# Patient Record
Sex: Female | Born: 1965 | Race: White | Hispanic: No | Marital: Single | Smoking: Never smoker
Health system: Southern US, Community
[De-identification: ages and names within clinical notes are randomized; demographics above are authoritative.]

## PROBLEM LIST (undated history)

## (undated) DIAGNOSIS — F410 Panic disorder [episodic paroxysmal anxiety] without agoraphobia: Secondary | ICD-10-CM

## (undated) DIAGNOSIS — K219 Gastro-esophageal reflux disease without esophagitis: Secondary | ICD-10-CM

## (undated) DIAGNOSIS — K859 Acute pancreatitis without necrosis or infection, unspecified: Secondary | ICD-10-CM

## (undated) DIAGNOSIS — G4730 Sleep apnea, unspecified: Secondary | ICD-10-CM

## (undated) DIAGNOSIS — M1990 Unspecified osteoarthritis, unspecified site: Secondary | ICD-10-CM

## (undated) DIAGNOSIS — D696 Thrombocytopenia, unspecified: Secondary | ICD-10-CM

## (undated) DIAGNOSIS — E119 Type 2 diabetes mellitus without complications: Secondary | ICD-10-CM

## (undated) HISTORY — PX: OTHER SURGICAL HISTORY: SHX169

## (undated) HISTORY — PX: TOTAL THYROIDECTOMY: SHX2547

## (undated) HISTORY — PX: CHOLECYSTECTOMY: SHX55

## (undated) HISTORY — PX: SHOULDER SURGERY: SHX246

---

## 2000-03-19 ENCOUNTER — Encounter: Admission: RE | Admit: 2000-03-19 | Discharge: 2000-03-19 | Payer: Self-pay | Admitting: Emergency Medicine

## 2000-05-30 ENCOUNTER — Encounter: Admission: RE | Admit: 2000-05-30 | Discharge: 2000-05-30 | Payer: Self-pay | Admitting: Internal Medicine

## 2000-07-09 ENCOUNTER — Emergency Department (HOSPITAL_COMMUNITY): Admission: EM | Admit: 2000-07-09 | Discharge: 2000-07-09 | Payer: Self-pay | Admitting: Emergency Medicine

## 2000-07-19 ENCOUNTER — Emergency Department (HOSPITAL_COMMUNITY): Admission: EM | Admit: 2000-07-19 | Discharge: 2000-07-19 | Payer: Self-pay | Admitting: Emergency Medicine

## 2001-05-21 ENCOUNTER — Other Ambulatory Visit: Admission: RE | Admit: 2001-05-21 | Discharge: 2001-05-21 | Payer: Self-pay | Admitting: Obstetrics and Gynecology

## 2001-05-23 ENCOUNTER — Ambulatory Visit (HOSPITAL_COMMUNITY): Admission: RE | Admit: 2001-05-23 | Discharge: 2001-05-23 | Payer: Self-pay | Admitting: Internal Medicine

## 2001-05-23 ENCOUNTER — Encounter: Payer: Self-pay | Admitting: Internal Medicine

## 2001-06-21 ENCOUNTER — Emergency Department (HOSPITAL_COMMUNITY): Admission: EM | Admit: 2001-06-21 | Discharge: 2001-06-22 | Payer: Self-pay | Admitting: *Deleted

## 2001-06-25 ENCOUNTER — Ambulatory Visit (HOSPITAL_COMMUNITY): Admission: RE | Admit: 2001-06-25 | Discharge: 2001-06-25 | Payer: Self-pay | Admitting: Internal Medicine

## 2001-06-25 ENCOUNTER — Encounter: Payer: Self-pay | Admitting: Internal Medicine

## 2001-07-05 ENCOUNTER — Ambulatory Visit (HOSPITAL_COMMUNITY): Admission: RE | Admit: 2001-07-05 | Discharge: 2001-07-05 | Payer: Self-pay | Admitting: Internal Medicine

## 2001-07-05 ENCOUNTER — Encounter: Payer: Self-pay | Admitting: Internal Medicine

## 2002-01-23 ENCOUNTER — Emergency Department (HOSPITAL_COMMUNITY): Admission: EM | Admit: 2002-01-23 | Discharge: 2002-01-23 | Payer: Self-pay | Admitting: Emergency Medicine

## 2002-01-23 ENCOUNTER — Encounter: Payer: Self-pay | Admitting: Emergency Medicine

## 2002-08-09 ENCOUNTER — Encounter: Payer: Self-pay | Admitting: Emergency Medicine

## 2002-08-09 ENCOUNTER — Emergency Department (HOSPITAL_COMMUNITY): Admission: EM | Admit: 2002-08-09 | Discharge: 2002-08-09 | Payer: Self-pay | Admitting: Emergency Medicine

## 2002-08-14 ENCOUNTER — Encounter: Payer: Self-pay | Admitting: Emergency Medicine

## 2002-08-14 ENCOUNTER — Ambulatory Visit (HOSPITAL_COMMUNITY): Admission: RE | Admit: 2002-08-14 | Discharge: 2002-08-14 | Payer: Self-pay | Admitting: Emergency Medicine

## 2002-09-30 ENCOUNTER — Emergency Department (HOSPITAL_COMMUNITY): Admission: EM | Admit: 2002-09-30 | Discharge: 2002-10-01 | Payer: Self-pay | Admitting: *Deleted

## 2002-09-30 ENCOUNTER — Encounter: Payer: Self-pay | Admitting: *Deleted

## 2002-12-08 ENCOUNTER — Inpatient Hospital Stay (HOSPITAL_COMMUNITY): Admission: EM | Admit: 2002-12-08 | Discharge: 2002-12-17 | Payer: Self-pay | Admitting: Psychiatry

## 2002-12-26 ENCOUNTER — Emergency Department (HOSPITAL_COMMUNITY): Admission: EM | Admit: 2002-12-26 | Discharge: 2002-12-26 | Payer: Self-pay | Admitting: Emergency Medicine

## 2003-01-22 ENCOUNTER — Inpatient Hospital Stay (HOSPITAL_COMMUNITY): Admission: EM | Admit: 2003-01-22 | Discharge: 2003-01-27 | Payer: Self-pay | Admitting: Psychiatry

## 2003-02-05 ENCOUNTER — Emergency Department (HOSPITAL_COMMUNITY): Admission: EM | Admit: 2003-02-05 | Discharge: 2003-02-05 | Payer: Self-pay | Admitting: Emergency Medicine

## 2003-03-11 ENCOUNTER — Ambulatory Visit (HOSPITAL_COMMUNITY): Admission: RE | Admit: 2003-03-11 | Discharge: 2003-03-11 | Payer: Self-pay | Admitting: Family Medicine

## 2003-03-18 ENCOUNTER — Emergency Department (HOSPITAL_COMMUNITY): Admission: EM | Admit: 2003-03-18 | Discharge: 2003-03-18 | Payer: Self-pay | Admitting: Emergency Medicine

## 2003-03-20 ENCOUNTER — Ambulatory Visit (HOSPITAL_COMMUNITY): Admission: RE | Admit: 2003-03-20 | Discharge: 2003-03-20 | Payer: Self-pay | Admitting: Orthopedic Surgery

## 2003-04-01 ENCOUNTER — Encounter (HOSPITAL_COMMUNITY): Admission: RE | Admit: 2003-04-01 | Discharge: 2003-05-02 | Payer: Self-pay | Admitting: Internal Medicine

## 2003-04-04 ENCOUNTER — Emergency Department (HOSPITAL_COMMUNITY): Admission: EM | Admit: 2003-04-04 | Discharge: 2003-04-04 | Payer: Self-pay | Admitting: *Deleted

## 2003-04-21 ENCOUNTER — Inpatient Hospital Stay (HOSPITAL_COMMUNITY): Admission: EM | Admit: 2003-04-21 | Discharge: 2003-04-22 | Payer: Self-pay | Admitting: Emergency Medicine

## 2003-05-07 ENCOUNTER — Encounter (HOSPITAL_COMMUNITY): Admission: RE | Admit: 2003-05-07 | Discharge: 2003-06-06 | Payer: Self-pay | Admitting: Orthopedic Surgery

## 2003-07-03 ENCOUNTER — Ambulatory Visit (HOSPITAL_COMMUNITY): Admission: RE | Admit: 2003-07-03 | Discharge: 2003-07-03 | Payer: Self-pay | Admitting: General Surgery

## 2003-07-13 ENCOUNTER — Inpatient Hospital Stay (HOSPITAL_COMMUNITY): Admission: EM | Admit: 2003-07-13 | Discharge: 2003-07-23 | Payer: Self-pay | Admitting: Psychiatry

## 2003-07-13 ENCOUNTER — Emergency Department (HOSPITAL_COMMUNITY): Admission: EM | Admit: 2003-07-13 | Discharge: 2003-07-13 | Payer: Self-pay | Admitting: Emergency Medicine

## 2004-11-06 ENCOUNTER — Inpatient Hospital Stay (HOSPITAL_COMMUNITY): Admission: EM | Admit: 2004-11-06 | Discharge: 2004-11-07 | Payer: Self-pay | Admitting: Emergency Medicine

## 2004-11-06 ENCOUNTER — Ambulatory Visit: Payer: Self-pay | Admitting: Psychiatry

## 2006-01-11 ENCOUNTER — Ambulatory Visit: Payer: Self-pay | Admitting: Psychiatry

## 2006-01-11 ENCOUNTER — Inpatient Hospital Stay (HOSPITAL_COMMUNITY): Admission: RE | Admit: 2006-01-11 | Discharge: 2006-01-16 | Payer: Self-pay | Admitting: Psychiatry

## 2007-04-13 ENCOUNTER — Emergency Department (HOSPITAL_COMMUNITY): Admission: EM | Admit: 2007-04-13 | Discharge: 2007-04-13 | Payer: Self-pay | Admitting: Emergency Medicine

## 2010-04-10 ENCOUNTER — Encounter: Payer: Self-pay | Admitting: Orthopedic Surgery

## 2010-04-11 ENCOUNTER — Encounter: Payer: Self-pay | Admitting: Family Medicine

## 2010-08-06 NOTE — Consult Note (Signed)
NAME:  [name], SCHUFF                       ACCOUNT NO.:  [id number]   MEDICAL RECORD NO.:  [id number]                   PATIENT TYPE:  INP   LOCATION:  A212                                 FACILITY:  [facility]   PHYSICIAN:  Scarlett Presto, M.D.                DATE OF BIRTH:  22-Oct-1965   DATE OF CONSULTATION:  04/21/2003  DATE OF DISCHARGE:                                   CONSULTATION   CARDIOLOGY CONSULTATION   PRIMARY CARE Cordarrel Stiefel:  Barrie Folk. Berdine Addison, M.D.   HISTORY OF PRESENT ILLNESS:  Ms. [name] is a 45 year old female with no  known coronary artery disease who presented to the [facility] ER  complaining of left anterior chest discomfort which was associated with  dyspnea and nausea.  She reports the gradual onset of this primarily at  rest. It is sharp in nature. It is constant.  It lasted for approximately 1-  1/2 hours before she was given a sublingual nitroglycerin with complete  relief. She basically was essentially in the shower; and the when she  finished the shower and she walked outside she began to feel a little bit  better.  The pain started in the shoulder.  She took a Benadryl, though it  would help her relax.  She felt like she was very very anxious when this  whole thing occurred.  She took 4 baby aspirin and came to the emergency  department.  She was given a sublingual nitroglycerin there. She got  immediate relief, and then had a recurrence of the pain, this morning, which  lasted minutes and resolved on its own.   PAST MEDICAL HISTORY:  Her past medical history is significant for  hyperlipidemia, severe anxiety and panic disorder with a history of suicidal  ideation. She has gastroesophageal reflux disease, bipolar disorder with  borderline personality.  She also has a history of lumbar spinal DJD for  which she is undergoing physical therapy for this. She had her tonsils out  and had laparoscopic treatment for endometriosis.   SOCIAL HISTORY:   She lives in [place] with her brother and his girlfriend  and their children.  She is disabled secondary to her severe psychiatric  disorder.  She does not smoke, she seldom drinks alcohol.  She does not do  any exercise due to her severe back pain although she gets physical therapy  3 times a week. Her mother died at age 37 of a myocardial infarction.  Father is alive at 64, but is not available, and his location is unknown.  She has 2 brothers and 2 half-brothers, none of whom have coronary disease.   MEDICATIONS PRIOR TO ADMISSION:  1. Demerol 100 mg q.4h. p.r.n. pain.  2. Seroquel 100 mg at night.  3. Remeron 30 mg at night.  4. Neurontin 300 mg 3 times a day  5. Trileptal 300 mg at night.   [place]:  1. Aspirin 325.  2. 1/2-normal saline with D5 at 100 an hour.  3. Neurontin 300 t.i.d.  4. Remeron 30 mg q.h.s.  5. Trileptal 300 mg q.h.s.  6. Protonix 80 mg daily.  7. Seroquel 100 mg q.h.s.  8. Demerol on a p.r.n. basis.  9. She takes nitroglycerin on a p.r.n. basis.   REVIEW OF SYSTEMS:  Her review of systems was essentially unremarkable  except for the description of the chest discomfort.  She does have frequent  nausea, anxiety, and depression. She gets tingling in her fingers whenever  she gets very nauseated.  She also gets periorbital tingling. She also  complains of a dry mouth.  All her other review of systems are negative.   PHYSICAL EXAMINATION:  GENERAL:  She is a well-developed, well-nourished,  white female in no apparent distress.  MENTAL STATUS:  She is alert and oriented x4.  VITAL SIGNS:  Pulse is 75, respiratory rate is 20.  Her blood pressure  is125/65.  She is just slightly overweight.  Lying in bed.  HEENT:  Exam is unremarkable except for very poor dentition.  NECK:  Her neck is supple.  She has no jugular venous distention or carotid  bruits.  CHEST:  Her chest is clear to auscultation.  CARDIOVASCULAR:  Exam is a regular rate  and rhythm with no murmurs, rubs, or  gallops.  ABDOMEN:  Her abdomen is soft, nontender with normoactive bowel sounds.  EXTREMITIES:  There are 2+ pulses throughout.  There is no clubbing,  cyanosis, or edema.  NEUROLOGIC:  Her neurologic exam is grossly nonfocal.  BREASTS, GENITOURINARY, AND RECTAL:  Exams were deferred.   CHEST X-RAY:  Normal.   ELECTROCARDIOGRAM:  Initially showed sinus tachycardia at a rate of 114 with  normal axes, normal intervals, but ST segment depression in leads V2-V4  which was horizontal and a little bit concerning.  Repeat electrocardiogram  shows sinus rhythm at a rate of 79 with nonspecific ST-T wave changes and  the ST depression is essentially resolved.   LABORATORIES:  White blood cell count 5.4; her H&H 12 and 36; platelet count  143.  Sodium 132, potassium 3.4, chloride 103, bicarb 27, BUN 3, creatinine  0.5, and her blood sugar is 127.  Two sets of cardiac enzymes are not  consistent with acute myocardial infarction.   ASSESSMENT:  So, we have a woman who has an interesting chest pain syndrome  which is very atypical coronary disease, but has EKG changes associated with  it.  Of note, is the EKG changes and the tachycardia are interesting in that  there is some repolarization abnormality in the other leads as well.  This  appears to resolve when she is in sinus.  She does have gastroesophageal  reflux disease.  She has the hyperlipidemia; we do not have any records of  that. She will probably need a set of fasting lipids and the degenerative  joint disease and then the anxiety panic disorders in the Axis I and II  psychiatric problems.   RECOMMENDATIONS:  We do an adenosine perfusion study.  I am not convinced  that these EKG changes are ischemic. She had relatively long duration of the  discomfort without any evidence of elevated cardiac enzymes.  Her electrocardiogram, currently, is pretty normal; and, I suspect, it is  possible that the  ST-T wave changes may be  repolarization abnormality associated with her neuroleptic medications.  At  this point, I think that it is  important to get a sense for the perfusion  through the left ventricle and to get a sense for whether or not she has  significant coronary disease.  We will also need to check her lipids.      ___________________________________________                                            Scarlett Presto, M.D.   JH/MEDQ  D:  04/21/2003  T:  04/21/2003  Job:  [id number]
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
## 2010-08-06 NOTE — Procedures (Signed)
NAME:  [name], [name]                       ACCOUNT NO.:  [id number]   MEDICAL RECORD NO.:  [id number]                   PATIENT TYPE:  INP   LOCATION:  A212                                 FACILITY:  [facility]   PHYSICIAN:  Scarlett Presto, M.D.                DATE OF BIRTH:  04-Sep-1965   DATE OF PROCEDURE:  DATE OF DISCHARGE:  04/22/2003                                    STRESS TEST   PROCEDURE:  Adenosine Cardizem stress test.   INDICATION:  [name] is a 45 year old female with no known coronary  artery disease with atypical chest discomfort.  She does have some ST  depression in leads V3 and V4 with increased heart rate.  This resolved with  a decrease in her heart rate.  She has had two sets of cardiac enzymes which  were negative  for acute myocardial infarction.   BASELINE DATA:  EKG reveals sinus rhythm at 96 beats per minute with  nonspecific ST abnormalities.  Blood pressure is 138/88.   DESCRIPTION OF PROCEDURE:  Adenosine, 39 mg, was infused over a four-minute  protocol with Cardiolite injected at three minutes.  The patient reported  chest heaviness, flushing and nausea, all of which resolved during recovery.  She did have ST depression in the inferolateral leads with increased heart  rate.  Maximum heart rate was 140 beats per minute.  Maximum blood pressure  was 148/78.   Final images and results are pending M.D. review.     ________________________________________  ___________________________________________  Cherre Blanc, P.A. [facility]                      Scarlett Presto, M.D.   AB/MEDQ  D:  04/22/2003  T:  04/22/2003  Job:  [id number]
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
## 2010-08-06 NOTE — H&P (Signed)
NAME:  [name], [name]                       ACCOUNT NO.:  [id number]   MEDICAL RECORD NO.:  [id number]                   PATIENT TYPE:  AMB   LOCATION:  DAY                                  FACILITY:  [facility]   PHYSICIAN:  Leane Para C. Tamala Julian, M.D.                DATE OF BIRTH:  [birth year]   DATE OF ADMISSION:  DATE OF DISCHARGE:                                HISTORY & PHYSICAL   HISTORY OF PRESENT ILLNESS:  [age]-year-old female with a history of  recurrent episodes of indigestion and nausea.  She has not had vomiting.  Her weight has been stable.  She has bilateral lower abdominal discomfort,  no diarrhea.  Nexium has been prescribed but has not been effective.  She is  scheduled for EGD.   PAST HISTORY:  She has:  1. Gastroesophageal reflux disease.  2. Panic disorder.  3. Chronic low back pain.   MEDICATIONS:  1. Aspirin 81 mg a day.  2. Lipitor 20 mg per day.  3. Nexium 40 mg b.i.d.  4. Singulair 10 mg daily.  5. Demerol 100 mg b.i.d.   SURGERY:  1. Tonsillectomy.  2. Laparoscopy.   PHYSICAL EXAMINATION:  VITAL SIGNS:  On exam, blood pressure 90/68, pulse  76, respirations 20, weight 149 pounds.  HEENT:  Unremarkable.  NECK:  Neck is supple.  No JVD or bruit.  CHEST:  Chest clear to auscultation.  HEART:  Regular rate and rhythm without murmur, gallop or rub.  ABDOMEN:  Mild epigastric tenderness, mild left lower quadrant tenderness.  BACK:  Diffuse lower lumbar tenderness.  EXTREMITIES:  No cyanosis, clubbing or edema.  NEUROLOGIC:  No focal motor, sensory or cerebellar deficit.   IMPRESSION:  1. Severe gastroesophageal reflux disease.  2. Bipolar disorder.  3. Chronic low back pain.  4. Seasonal allergies.   PLAN:  Upper and possible lower endoscopy.     ___________________________________________                                         Vernon Prey. Tamala Julian, M.D.   LCS/MEDQ  D:  07/02/2003  T:  07/03/2003  Job:  [id number]

## 2010-08-06 NOTE — H&P (Signed)
NAME:  [name], SAWDEY                       ACCOUNT NO.:  [id number]   MEDICAL RECORD NO.:  [id number]                   PATIENT TYPE:  INP   LOCATION:  A212                                 FACILITY:  [facility]   PHYSICIAN:  Unk Lightning, MD        DATE OF BIRTH:  [birth year]   DATE OF ADMISSION:  04/20/2003  DATE OF DISCHARGE:                                HISTORY & PHYSICAL   HISTORY OF PRESENT ILLNESS:  The patient is a 45 year old white female  status post menopause for 2 years with a history of mild hyperlipidemia who  was at rest tonight and had left-sided sharp chest pain associated with  nausea which persisted for 90 minutes.  This was associated also with  dyspnea.  She has a history of general anxiety disorder and panic disorder,  and specifically states this different than her previous episodes.  She was  found to have a mildly elevated troponin with normal CK's.  She was admitted  for observation and repeat EKG and enzymes with minimal risk factors.   PAST MEDICAL HISTORY:  1. Significant for anxiety disorder and panic disorder.  2. Mild hyperlipidemia.  3. GERD.  4. Bipolar disorder.  5. DJD of the lumbosacral spine, undergoing physical therapy.   PAST SURGICAL HISTORY:  1. Remarkable for tonsillectomy.  2. Laparoscopic surgery for endometriosis.   LAST MENSTRUAL PERIOD:  Two years ago.   CURRENT MEDICATIONS:  1. Demerol 100 mg q.4h. p.r.n.  2. Seroquel 100 mg h.s.  3. Remeron 30 mg h.s.  4. Neurontin 300 mg t.i.d.  5. Trileptal 300 mg h.s.   SOCIAL HISTORY:  She is a nonsmoker.  She is separated.   PHYSICAL EXAMINATION:  VITAL SIGNS:  Blood pressure 120/78, pulse 90 and  regular, she is afebrile, respiratory rate 18.  HEENT:  Head is normocephalic and atraumatic.  Eyes - pupils are equal,  round and reactive to light.  Extraocular movements intact.  Sclerae are  clear.  Conjunctivae pink.  NECK:  No jugular venous distention, 45 degree  angulation.  No carotid  bruits, no thyromegaly.  LUNGS:  Clear to auscultation and percussion.  No rales, wheezes, or rhonchi  appreciable.  HEART:  Regular rhythm.  No murmurs, gallops, heaves, thrills, or rubs.  ABDOMEN:  Obese, soft, nontender.  Bowel sounds normoactive.  No rebound, no  mass, no megaly.  EXTREMITIES:  No clubbing, cyanosis, or edema.  NEUROLOGIC:  Cranial nerves II-XII are grossly intact.  The patient moves  all 4 extremities.  Does not really appear anxious at present.   IMPRESSION:  1. Typical/atypical chest pain.  2. Elevated troponins which are inexplicable.  3. General anxiety disorder/panic disorder.  4. Mild hyperlipidemia.  5. Status post menopause 2 years.   PLAN:  Admit, repeat EKG and enzymes in the a.m., and assess any recurrence  of symptoms, and make further recommendations as the database explains.  Also, give aspirin and sublingual nitroglycerin p.r.n.  ___________________________________________                                         Unk Lightning, MD   RMD/MEDQ  D:  04/21/2003  T:  04/21/2003  Job:  [id number]

## 2010-08-06 NOTE — Discharge Summary (Signed)
NAME:  [name], [name]                       ACCOUNT NO.:  [id number]   MEDICAL RECORD NO.:  [id number]                   PATIENT TYPE:  INP   LOCATION:  A212                                 FACILITY:  [facility]   PHYSICIAN:  Karlyn Agee, M.D.              DATE OF BIRTH:  [birth year]   DATE OF ADMISSION:  04/20/2003  DATE OF DISCHARGE:  04/22/2003                                 DISCHARGE SUMMARY   PRIMARY CARE PHYSICIAN:  Dr. Berdine Addison.   DISCHARGE DIAGNOSES:  1. Chest pain, myocardial infarction ruled out.  2. Anxiety disorder and panic disorder.  3. History of mild hyperlipidemia.  4. History of gastroesophageal reflux disease.  5. Bipolar disorder.  6. History of degenerative joint disease of the lumbosacral spine,     undergoing physical therapy.   DISPOSITION:  Discharged to home.   DISCHARGE CONDITION:  Stable.   DISCHARGE MEDICATIONS:  1. Seroquel 100 mg at bedtime.  2. Remeron 30 mg at bedtime.  3. Neurontin 300 mg three times daily.  4. Trileptal 300 mg at bedtime.  5. Demerol as prescribed by primary care physician.  6. Nexium 40 mg daily.  7. Aspirin 81 mg daily.   HOSPITAL COURSE:  This is a 45 year old lady with a history of anxiety  disorder and mild hyperlipidemia who presented with a sharp left sided chest  pain associated with nausea.  Because she is post menopausal and gave a  history of hyperlipidemia, the patient was admitted to rule out MI.  Cardiac  enzymes are negative.  EKG is unremarkable.  She underwent an adenosine  Cardiolite today which was completely normal, which showed no signs of  ischemia with a normal left ventricular ejection fraction.  The patient is  reassured that she has no current cardiac cause for the chest pain.   She is discharged with Nexium for a possible GERD, and we are adding Lipitor  to her medicines since her LDL was 143.  The patient is advised to followup  with her primary care physician as soon as possible and to continue  to take  psychotropic medications as prescribed as there is a suspicion from how she  behaved in the hospital that she may not be taking her medication as  prescribed.   FOLLOW UP:  1. Followup  with Dr. Berdine Addison.  2. Followup with doctor at [facility].   SPECIAL INSTRUCTIONS:  Take all medications as prescribed.  See your primary  care physician as soon as possible.     ___________________________________________                                         Karlyn Agee, M.D.   LC/MEDQ  D:  04/22/2003  T:  04/23/2003  Job:  [id number]
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
## 2010-12-09 LAB — DIFFERENTIAL
Basophils Absolute: 0.1
Basophils Relative: 1
Eosinophils Absolute: 0
Eosinophils Relative: 0
Lymphs Abs: 3.1

## 2010-12-09 LAB — CBC
HCT: 38.5
MCHC: 33.5
MCV: 89.2
Platelets: 152
RDW: 12.9

## 2010-12-09 LAB — BASIC METABOLIC PANEL
BUN: 14
CO2: 30
Chloride: 102
Creatinine, Ser: 0.62
Glucose, Bld: 112 — ABNORMAL HIGH
Potassium: 3.4 — ABNORMAL LOW

## 2010-12-09 LAB — URINALYSIS, ROUTINE W REFLEX MICROSCOPIC
Glucose, UA: NEGATIVE
Ketones, ur: NEGATIVE
Protein, ur: NEGATIVE
Urobilinogen, UA: 0.2

## 2012-08-10 ENCOUNTER — Emergency Department (HOSPITAL_COMMUNITY)
Admission: EM | Admit: 2012-08-10 | Discharge: 2012-08-10 | Disposition: A | Discharge status: Home / Self Care | Payer: Medicare Other | Attending: Emergency Medicine | Admitting: Emergency Medicine

## 2012-08-10 ENCOUNTER — Encounter (HOSPITAL_COMMUNITY): Payer: Self-pay

## 2012-08-10 DIAGNOSIS — K219 Gastro-esophageal reflux disease without esophagitis: Secondary | ICD-10-CM | POA: Insufficient documentation

## 2012-08-10 DIAGNOSIS — R739 Hyperglycemia, unspecified: Secondary | ICD-10-CM

## 2012-08-10 DIAGNOSIS — Z794 Long term (current) use of insulin: Secondary | ICD-10-CM | POA: Insufficient documentation

## 2012-08-10 DIAGNOSIS — Z79899 Other long term (current) drug therapy: Secondary | ICD-10-CM | POA: Insufficient documentation

## 2012-08-10 DIAGNOSIS — E1169 Type 2 diabetes mellitus with other specified complication: Secondary | ICD-10-CM | POA: Insufficient documentation

## 2012-08-10 DIAGNOSIS — R109 Unspecified abdominal pain: Secondary | ICD-10-CM | POA: Insufficient documentation

## 2012-08-10 DIAGNOSIS — Z8669 Personal history of other diseases of the nervous system and sense organs: Secondary | ICD-10-CM | POA: Insufficient documentation

## 2012-08-10 DIAGNOSIS — Z8739 Personal history of other diseases of the musculoskeletal system and connective tissue: Secondary | ICD-10-CM | POA: Insufficient documentation

## 2012-08-10 DIAGNOSIS — F411 Generalized anxiety disorder: Secondary | ICD-10-CM | POA: Insufficient documentation

## 2012-08-10 DIAGNOSIS — J029 Acute pharyngitis, unspecified: Secondary | ICD-10-CM | POA: Insufficient documentation

## 2012-08-10 HISTORY — DX: Panic disorder (episodic paroxysmal anxiety): F41.0

## 2012-08-10 HISTORY — DX: Gastro-esophageal reflux disease without esophagitis: K21.9

## 2012-08-10 HISTORY — DX: Sleep apnea, unspecified: G47.30

## 2012-08-10 HISTORY — DX: Type 2 diabetes mellitus without complications: E11.9

## 2012-08-10 HISTORY — DX: Unspecified osteoarthritis, unspecified site: M19.90

## 2012-08-10 MED ORDER — INSULIN ASPART 100 UNIT/ML ~~LOC~~ SOLN
6.0000 [IU] | Freq: Once | SUBCUTANEOUS | Status: AC
  Administered 2012-08-10: 6 [IU] via SUBCUTANEOUS
  Filled 2012-08-10: qty 1

## 2012-08-10 MED ORDER — INSULIN ASPART 100 UNIT/ML ~~LOC~~ SOLN: SUBCUTANEOUS | Status: DC

## 2012-08-10 NOTE — ED Provider Notes (Signed)
History     CSN: [id number]  Arrival date & time 08/10/12  0002   First MD Initiated Contact with Patient 08/10/12 0003      Chief Complaint  Patient presents with  . Hyperglycemia    (Consider location/radiation/quality/duration/timing/severity/associated sxs/prior treatment) HPI Comments: Patient is a 47 year old diabetic female who presents to the emergency department with complaint of the finger sugars are up. Patient is from out-of-town, states that she's been traveling for several hours, and will forgot her insulin. The patient states she was feeling a funny feeling in her mouth and throat, as if she were developing sore throat. She has also felt this when her glucose was elevated. No LOC reported.  She presents to ED for evaluation of glucose and to obtain Rx for insulin.  Patient is a 47 y.o. female presenting with hyperglycemia. The history is provided by the patient.  Hyperglycemia Associated symptoms: abdominal pain   Associated symptoms: no chest pain, no confusion, no dizziness, no dysuria and no shortness of breath     Past Medical History  Diagnosis Date  . Diabetes mellitus without complication   . Degenerative joint disease   . Sleep apnea   . Panic attack   . Gastroesophageal reflux     History reviewed. No pertinent past surgical history.  No family history on file.  History  Substance Use Topics  . Smoking status: Never Smoker   . Smokeless tobacco: Not on file  . Alcohol Use: Not on file    OB History   Grav Para Term Preterm Abortions TAB SAB Ect Mult Living                  Review of Systems  Constitutional: Negative for activity change.       All ROS Neg except as noted in HPI  HENT: Negative for nosebleeds and neck pain.   Eyes: Negative for photophobia and discharge.  Respiratory: Negative for cough, shortness of breath and wheezing.   Cardiovascular: Negative for chest pain and palpitations.  Gastrointestinal: Positive for abdominal  pain. Negative for blood in stool.  Genitourinary: Negative for dysuria, frequency and hematuria.  Musculoskeletal: Negative for back pain and arthralgias.  Skin: Negative.   Neurological: Negative for dizziness, seizures and speech difficulty.  Psychiatric/Behavioral: Negative for hallucinations and confusion. The patient is nervous/anxious.     Allergies  Bactrim; Ciprofloxacin; Dilaudid; and Tylenol  Home Medications  No current outpatient prescriptions on file.  BP 150/86  Pulse 89  Temp(Src) 98.2 F (36.8 C) (Oral)  Resp 20  Ht 5\' 2"  (1.575 m)  Wt 151 lb (68.493 kg)  BMI 27.61 kg/m2  SpO2 100%  Physical Exam  Nursing note and vitals reviewed. Constitutional: She is oriented to person, place, and time. She appears well-developed and well-nourished.  Non-toxic appearance.  HENT:  Head: Normocephalic.  Right Ear: Tympanic membrane and external ear normal.  Left Ear: Tympanic membrane and external ear normal.  Eyes: EOM and lids are normal. Pupils are equal, round, and reactive to light.  Neck: Normal range of motion. Neck supple. Carotid bruit is not present.  Cardiovascular: Normal rate, regular rhythm, normal heart sounds, intact distal pulses and normal pulses.   Pulmonary/Chest: Breath sounds normal. No respiratory distress.  Abdominal: Soft. Bowel sounds are normal. There is no tenderness. There is no guarding.  Musculoskeletal: Normal range of motion.  Lymphadenopathy:       Head (right side): No submandibular adenopathy present.       Head (  left side): No submandibular adenopathy present.    She has no cervical adenopathy.  Neurological: She is alert and oriented to person, place, and time. She has normal strength. No cranial nerve deficit or sensory deficit.  Skin: Skin is warm and dry.  Psychiatric: She has a normal mood and affect. Her speech is normal.    ED Course  Procedures (including critical care time)  Labs Reviewed  GLUCOSE, CAPILLARY - Abnormal;  Notable for the following:    Glucose-Capillary 273 (*)    All other components within normal limits   No results found.   Date: 08/10/2012  Rate:84  Rhythm: normal sinus rhythm  QRS Axis: normal  Intervals: normal  ST/T Wave abnormalities: normal  Conduction Disutrbances:none  Narrative Interpretation: No STEMI  Old EKG Reviewed: none available   No diagnosis found.    MDM  I have reviewed nursing notes, vital signs, and all appropriate lab and imaging results for this patient. Pt presents to ED from another state. While traveling today she felt her glucose was elevated and she "just didn't feel right". She tested her glucose and it was over 350 earlier today.  Tonight in ED glucose is 273, treated with 6 units of NovoLog insulin. EKG reveals NSR. No STEMI.  Pt is ambulatory without problem. Plan - Pt given RX for insulin. She with see her primary MD upon return for evaluation. Pt advised to increase fluids andfollow diet closely.       Kathie Dike, PA-C 08/10/12 0120

## 2012-08-10 NOTE — ED Notes (Signed)
Pt here from out of town, states she forgot her insulin at home.  Pt states her sugars have increased all day, took some long acting insulin that belonged to a friend but does not know what kind it was.

## 2012-08-10 NOTE — ED Provider Notes (Signed)
Medical screening examination/treatment/procedure(s) were performed by non-physician practitioner and as supervising physician I was immediately available for consultation/collaboration.  Brooklinn Longbottom S. Ryman Rathgeber, MD 08/10/12 0411 

## 2012-08-25 DIAGNOSIS — F32A Depression, unspecified: Secondary | ICD-10-CM | POA: Insufficient documentation

## 2013-04-12 DIAGNOSIS — M545 Low back pain, unspecified: Secondary | ICD-10-CM | POA: Insufficient documentation

## 2013-05-28 DIAGNOSIS — M539 Dorsopathy, unspecified: Secondary | ICD-10-CM | POA: Insufficient documentation

## 2014-01-17 DIAGNOSIS — K7030 Alcoholic cirrhosis of liver without ascites: Secondary | ICD-10-CM | POA: Insufficient documentation

## 2014-01-17 DIAGNOSIS — D696 Thrombocytopenia, unspecified: Secondary | ICD-10-CM | POA: Diagnosis present

## 2014-03-05 DIAGNOSIS — E890 Postprocedural hypothyroidism: Secondary | ICD-10-CM | POA: Insufficient documentation

## 2014-03-05 DIAGNOSIS — E119 Type 2 diabetes mellitus without complications: Secondary | ICD-10-CM | POA: Insufficient documentation

## 2014-03-05 DIAGNOSIS — K7460 Unspecified cirrhosis of liver: Secondary | ICD-10-CM | POA: Insufficient documentation

## 2014-07-01 DIAGNOSIS — E042 Nontoxic multinodular goiter: Secondary | ICD-10-CM | POA: Insufficient documentation

## 2014-10-17 DIAGNOSIS — H538 Other visual disturbances: Secondary | ICD-10-CM | POA: Insufficient documentation

## 2014-10-17 DIAGNOSIS — Z01810 Encounter for preprocedural cardiovascular examination: Secondary | ICD-10-CM | POA: Insufficient documentation

## 2014-10-17 DIAGNOSIS — R110 Nausea: Secondary | ICD-10-CM | POA: Insufficient documentation

## 2014-11-06 DIAGNOSIS — N760 Acute vaginitis: Secondary | ICD-10-CM | POA: Insufficient documentation

## 2014-12-25 DIAGNOSIS — N390 Urinary tract infection, site not specified: Secondary | ICD-10-CM | POA: Insufficient documentation

## 2015-03-20 DIAGNOSIS — F331 Major depressive disorder, recurrent, moderate: Secondary | ICD-10-CM | POA: Insufficient documentation

## 2015-03-20 DIAGNOSIS — E119 Type 2 diabetes mellitus without complications: Secondary | ICD-10-CM

## 2015-12-03 DIAGNOSIS — E1142 Type 2 diabetes mellitus with diabetic polyneuropathy: Secondary | ICD-10-CM | POA: Insufficient documentation

## 2015-12-03 DIAGNOSIS — K760 Fatty (change of) liver, not elsewhere classified: Secondary | ICD-10-CM | POA: Insufficient documentation

## 2016-04-20 DIAGNOSIS — E1129 Type 2 diabetes mellitus with other diabetic kidney complication: Secondary | ICD-10-CM | POA: Insufficient documentation

## 2016-09-22 ENCOUNTER — Emergency Department (HOSPITAL_COMMUNITY): Payer: Medicare HMO

## 2016-09-22 ENCOUNTER — Emergency Department (HOSPITAL_COMMUNITY)
Admission: EM | Admit: 2016-09-22 | Discharge: 2016-09-22 | Disposition: A | Discharge status: Home / Self Care | Payer: Medicare HMO | Attending: Emergency Medicine | Admitting: Emergency Medicine

## 2016-09-22 ENCOUNTER — Encounter (HOSPITAL_COMMUNITY): Payer: Self-pay

## 2016-09-22 DIAGNOSIS — Z7984 Long term (current) use of oral hypoglycemic drugs: Secondary | ICD-10-CM | POA: Insufficient documentation

## 2016-09-22 DIAGNOSIS — Y939 Activity, unspecified: Secondary | ICD-10-CM | POA: Diagnosis not present

## 2016-09-22 DIAGNOSIS — D709 Neutropenia, unspecified: Secondary | ICD-10-CM

## 2016-09-22 DIAGNOSIS — W19XXXA Unspecified fall, initial encounter: Secondary | ICD-10-CM | POA: Insufficient documentation

## 2016-09-22 DIAGNOSIS — Z79899 Other long term (current) drug therapy: Secondary | ICD-10-CM | POA: Diagnosis not present

## 2016-09-22 DIAGNOSIS — E119 Type 2 diabetes mellitus without complications: Secondary | ICD-10-CM | POA: Insufficient documentation

## 2016-09-22 DIAGNOSIS — N3000 Acute cystitis without hematuria: Secondary | ICD-10-CM | POA: Diagnosis not present

## 2016-09-22 DIAGNOSIS — Y999 Unspecified external cause status: Secondary | ICD-10-CM | POA: Diagnosis not present

## 2016-09-22 DIAGNOSIS — Y929 Unspecified place or not applicable: Secondary | ICD-10-CM | POA: Insufficient documentation

## 2016-09-22 DIAGNOSIS — R531 Weakness: Secondary | ICD-10-CM

## 2016-09-22 HISTORY — DX: Thrombocytopenia, unspecified: D69.6

## 2016-09-22 LAB — URINALYSIS, ROUTINE W REFLEX MICROSCOPIC
BILIRUBIN URINE: NEGATIVE
Glucose, UA: NEGATIVE mg/dL
KETONES UR: NEGATIVE mg/dL
Nitrite: POSITIVE — AB
PH: 5 (ref 5.0–8.0)
PROTEIN: 30 mg/dL — AB
Renal Epithelial: 2
Specific Gravity, Urine: 1.026 (ref 1.005–1.030)

## 2016-09-22 LAB — CBC WITH DIFFERENTIAL/PLATELET
BASOS ABS: 0 10*3/uL (ref 0.0–0.1)
BASOS PCT: 1 %
EOS ABS: 0.2 10*3/uL (ref 0.0–0.7)
EOS PCT: 6 %
HCT: 34.7 % — ABNORMAL LOW (ref 36.0–46.0)
Hemoglobin: 11.3 g/dL — ABNORMAL LOW (ref 12.0–15.0)
LYMPHS PCT: 29 %
Lymphs Abs: 0.8 10*3/uL (ref 0.7–4.0)
MCH: 29.7 pg (ref 26.0–34.0)
MCHC: 32.6 g/dL (ref 30.0–36.0)
MCV: 91.3 fL (ref 78.0–100.0)
MONO ABS: 0.3 10*3/uL (ref 0.1–1.0)
Monocytes Relative: 11 %
Neutro Abs: 1.6 10*3/uL — ABNORMAL LOW (ref 1.7–7.7)
Neutrophils Relative %: 54 %
PLATELETS: 54 10*3/uL — AB (ref 150–400)
RBC: 3.8 MIL/uL — ABNORMAL LOW (ref 3.87–5.11)
RDW: 15.1 % (ref 11.5–15.5)
WBC: 2.9 10*3/uL — ABNORMAL LOW (ref 4.0–10.5)

## 2016-09-22 LAB — RAPID URINE DRUG SCREEN, HOSP PERFORMED
Amphetamines: NOT DETECTED
BARBITURATES: NOT DETECTED
Benzodiazepines: NOT DETECTED
Cocaine: NOT DETECTED
Opiates: POSITIVE — AB
TETRAHYDROCANNABINOL: NOT DETECTED

## 2016-09-22 LAB — COMPREHENSIVE METABOLIC PANEL
ALT: 29 U/L (ref 14–54)
AST: 65 U/L — AB (ref 15–41)
Albumin: 3.2 g/dL — ABNORMAL LOW (ref 3.5–5.0)
Alkaline Phosphatase: 107 U/L (ref 38–126)
Anion gap: 6 (ref 5–15)
BUN: 12 mg/dL (ref 6–20)
CALCIUM: 8.4 mg/dL — AB (ref 8.9–10.3)
CHLORIDE: 104 mmol/L (ref 101–111)
CO2: 27 mmol/L (ref 22–32)
Creatinine, Ser: 0.47 mg/dL (ref 0.44–1.00)
GFR calc Af Amer: >60 mL/min (ref >60)
Glucose, Bld: 143 mg/dL — ABNORMAL HIGH (ref 65–99)
POTASSIUM: 3.6 mmol/L (ref 3.5–5.1)
SODIUM: 137 mmol/L (ref 135–145)
TOTAL PROTEIN: 6.4 g/dL — AB (ref 6.5–8.1)
Total Bilirubin: 1 mg/dL (ref 0.3–1.2)

## 2016-09-22 MED ORDER — SODIUM CHLORIDE 0.9 % IV SOLN: INTRAVENOUS | Status: DC

## 2016-09-22 MED ORDER — NITROFURANTOIN MONOHYD MACRO 100 MG PO CAPS: 100.0000 mg | ORAL_CAPSULE | Freq: Two times a day (BID) | ORAL | 0 refills | Status: DC

## 2016-09-22 MED ORDER — SODIUM CHLORIDE 0.9 % IV BOLUS (SEPSIS)
500.0000 mL | Freq: Once | INTRAVENOUS | Status: AC
  Administered 2016-09-22: 500 mL via INTRAVENOUS

## 2016-09-22 NOTE — Discharge Instructions (Signed)
Take antibiotic as directed. Return for a newer worse symptoms remain compliant with follow up with neurology regarding the weakness. Also your white blood cell counts were diffusely low will need to have that rechecked in a week or 2 by your primary care doctor. Return for any new or worse symptoms return for any fevers.

## 2016-09-22 NOTE — ED Provider Notes (Signed)
[facility] DEPT Provider Note   CSN: [id number] Arrival date & time: 09/22/16  1524     History   Chief Complaint Chief Complaint  Patient presents with  . Fall    HPI [name] is a 51 y.o. female.  Patient with several falls. And states that her gait is abnormal. This is been going on since Monday. She feels weak and off balance but no vertigo or room spinning. Denies any chest pain denies headache shortness of breath abdominal pain nausea vomiting or diarrhea no blue spell movements denies any tick bites recently or any ticks being on her currently.      Past Medical History:  Diagnosis Date  . Degenerative joint disease   . Diabetes mellitus without complication ([place])   . Gastroesophageal reflux   . Panic attack   . Sleep apnea   . Thrombocytopenia ([place])     There are no active problems to display for this patient.   Past Surgical History:  Procedure Laterality Date  . CHOLECYSTECTOMY    . SHOULDER SURGERY     left   . tongue sx. titanium screw placed to hold tongue down    . TOTAL THYROIDECTOMY      OB History    No data available       Home Medications    Prior to Admission medications   Medication Sig Start Date End Date Taking? Authorizing Provider  busPIRone (BUSPAR) 5 MG tablet Take 1 tablet by mouth 3 (three) times daily. 09/15/16  Yes [provider]  gabapentin (NEURONTIN) 600 MG tablet Take 600 mg by mouth 4 (four) times daily. 02/06/15  Yes [provider]  levothyroxine (SYNTHROID, LEVOTHROID) 50 MCG tablet Take 1 tablet by mouth daily. Take at night. 07/25/14  Yes [provider]  meloxicam (MOBIC) 15 MG tablet Take 1 tablet by mouth daily. 09/10/16  Yes [provider]  metFORMIN (GLUCOPHAGE) 500 MG tablet Take 1,000 mg by mouth 2 (two) times daily with a meal.    Yes [provider]  omeprazole (PRILOSEC) 40 MG capsule Take 40 mg by mouth daily.   Yes [provider]    ondansetron (ZOFRAN-ODT) 4 MG disintegrating tablet Take 1 tablet by mouth daily as needed for nausea/vomiting. 09/18/16  Yes [provider]  sertraline (ZOLOFT) 100 MG tablet Take 150 mg by mouth daily. 09/10/16  Yes [provider]  traZODone (DESYREL) 100 MG tablet Take 1 tablet by mouth daily. 09/15/16  Yes [provider]  nitrofurantoin, macrocrystal-monohydrate, (MACROBID) 100 MG capsule Take 1 capsule (100 mg total) by mouth 2 (two) times daily. 09/22/16   Fredia Sorrow, MD    Family History No family history on file.  Social History Social History  Substance Use Topics  . Smoking status: Never Smoker  . Smokeless tobacco: Never Used  . Alcohol use No     Allergies   Bactrim [sulfamethoxazole-trimethoprim]; Ciprofloxacin; Dilaudid [hydromorphone hcl]; Keflex [cephalexin]; and Tylenol [acetaminophen]   Review of Systems Review of Systems  Constitutional: Negative for fever.  HENT: Negative for congestion.   Eyes: Negative for visual disturbance.  Respiratory: Negative for shortness of breath.   Cardiovascular: Negative for chest pain.  Gastrointestinal: Negative for abdominal pain.  Genitourinary: Negative for dysuria.  Musculoskeletal: Negative for back pain and neck pain.  Skin: Positive for pallor.  Neurological: Positive for weakness.  Hematological: Does not bruise/bleed easily.  Psychiatric/Behavioral: Negative for confusion.     Physical Exam Updated Vital Signs BP Marland Kitchen)  155/101   Pulse 82   Temp 99.1 F (37.3 C) (Oral)   Resp 17   Ht 1.6 m (5' 3" )   Wt 67.1 kg (148 lb)   SpO2 99%   BMI 26.22 kg/m   Physical Exam  Constitutional: She is oriented to person, place, and time. She appears well-developed and well-nourished. No distress.  HENT:  Head: Normocephalic and atraumatic.  Mouth/Throat: Oropharynx is clear and moist.  Eyes: Conjunctivae and EOM are normal. Pupils are equal, round, and reactive to light.  Neck: Normal  range of motion. Neck supple.  Cardiovascular: Normal rate, regular rhythm and normal heart sounds.   Pulmonary/Chest: Effort normal and breath sounds normal. No respiratory distress.  Abdominal: Soft. Bowel sounds are normal. There is no tenderness.  Musculoskeletal: Normal range of motion. She exhibits no edema.  Neurological: She is alert and oriented to person, place, and time. No cranial nerve deficit or sensory deficit. She exhibits normal muscle tone. Coordination normal.  Perhaps some slight weakness to both lower extremities.  Skin: Skin is warm.  Patient with several skin sores looks like she scratches them open. Nothing secondarily infected. No bullae no vesicles  Nursing note and vitals reviewed.    ED Treatments / Results  Labs (all labs ordered are listed, but only abnormal results are displayed) Labs Reviewed  CBC WITH DIFFERENTIAL/PLATELET - Abnormal; Notable for the following:       Result Value   WBC 2.9 (*)    RBC 3.80 (*)    Hemoglobin 11.3 (*)    HCT 34.7 (*)    Platelets 54 (*)    Neutro Abs 1.6 (*)    All other components within normal limits  COMPREHENSIVE METABOLIC PANEL - Abnormal; Notable for the following:    Glucose, Bld 143 (*)    Calcium 8.4 (*)    Total Protein 6.4 (*)    Albumin 3.2 (*)    AST 65 (*)    All other components within normal limits  URINALYSIS, ROUTINE W REFLEX MICROSCOPIC - Abnormal; Notable for the following:    Color, Urine AMBER (*)    APPearance CLOUDY (*)    Hgb urine dipstick SMALL (*)    Protein, ur 30 (*)    Nitrite POSITIVE (*)    Leukocytes, UA MODERATE (*)    Bacteria, UA MANY (*)    Squamous Epithelial / LPF 6-30 (*)    Non Squamous Epithelial 0-5 (*)    All other components within normal limits  RAPID URINE DRUG SCREEN, HOSP PERFORMED - Abnormal; Notable for the following:    Opiates POSITIVE (*)    All other components within normal limits  URINE CULTURE    EKG  EKG Interpretation None        Radiology Ct Head Wo Contrast  Result Date: 09/22/2016 CLINICAL DATA:  Patient has fallen at least 4 times since Monday. Weakness and off balance. EXAM: CT HEAD WITHOUT CONTRAST TECHNIQUE: Contiguous axial images were obtained from the base of the skull through the vertex without intravenous contrast. COMPARISON:  08/07/2016 FINDINGS: Brain: No evidence of acute infarction, hemorrhage, hydrocephalus, extra-axial collection or mass lesion/mass effect. Vascular: No hyperdense vessel or unexpected calcification. Skull: Normal. Negative for fracture or focal lesion. Sinuses/Orbits: Normal bilateral mastoids. No acute paranasal sinus disease. Intact orbits and globes. Other: None. IMPRESSION: Stable appearance of the brain. No acute intracranial abnormality is identified. Electronically Signed   By: Ashley Royalty M.D.   On: 09/22/2016 18:25   Mr  Brain Wo Contrast (neuro Protocol)  Result Date: 09/22/2016 CLINICAL DATA:  Weakness and gait instability resulting in multiple falls. History of diabetes. EXAM: MRI HEAD WITHOUT CONTRAST TECHNIQUE: Multiplanar, multiecho pulse sequences of the brain and surrounding structures were obtained without intravenous contrast. COMPARISON:  CT HEAD September 22, 2016 at 1812 hours FINDINGS: BRAIN: No reduced diffusion to suggest acute ischemia. No susceptibility artifact to suggest hemorrhage. Mild ventriculomegaly on the basis of global parenchymal brain volume loss as there is overall commensurate enlargement of cerebral sulci and cerebellar folia. No suspicious parenchymal signal, mass or mass effect. No abnormal extra-axial fluid collections. VASCULAR: Normal major intracranial vascular flow voids present at skull base. SKULL AND UPPER CERVICAL SPINE: No abnormal sellar expansion. No suspicious calvarial bone marrow signal. Craniocervical junction maintained. SINUSES/ORBITS: The mastoid air-cells and included paranasal sinuses are well-aerated. The included ocular globes and  orbital contents are non-suspicious. OTHER:  Patient is edentulous. IMPRESSION: 1. No acute intracranial process. 2. Mild parenchymal brain volume loss for age. Electronically Signed   By: Elon Alas M.D.   On: 09/22/2016 20:54    Procedures Procedures (including critical care time)  Medications Ordered in ED Medications  0.9 %  sodium chloride infusion (not administered)  sodium chloride 0.9 % bolus 500 mL (0 mLs Intravenous Stopped 09/22/16 1858)     Initial Impression / Assessment and Plan / ED Course  I have reviewed the triage vital signs and the nursing notes.  Pertinent labs & imaging results that were available during my care of the patient were reviewed by me and considered in my medical decision making (see chart for details).     Get a Patient presents tonight for frequent falls feeling as if her gait is off and weakness to both legs. Workup here tonight CT MRI without any acute findings intracranially. Patient with some bilateral leg weakness but upper extremities are fine. Lab workup showed sort of a pain and leukopenia. Will require some follow-up. No fevers. Patient also had evidence of a urinary tract infection has extensive antibiotic allergies but can take Macrodantin so we'll put her on Macrobid. She'll start this tomorrow. We'll give her follow-up with neurology. She'll return for any new or worse symptoms.  Final Clinical Impressions(s) / ED Diagnoses   Final diagnoses:  Fall, initial encounter  Acute cystitis without hematuria  Neutropenia, unspecified type ([facility])  Weakness    New Prescriptions New Prescriptions   NITROFURANTOIN, MACROCRYSTAL-MONOHYDRATE, (MACROBID) 100 MG CAPSULE    Take 1 capsule (100 mg total) by mouth 2 (two) times daily.     Fredia Sorrow, MD 09/22/16 2236

## 2016-09-22 NOTE — ED Triage Notes (Signed)
Pt reports that since Monday she has been falling. She has fallen at least 4 times. States she feels weak and off balance Family says she starts wobbling back and forth and then falls.

## 2016-09-25 LAB — URINE CULTURE: Culture: >=100000 — AB

## 2016-09-26 ENCOUNTER — Telehealth: Payer: Self-pay | Admitting: Emergency Medicine

## 2016-09-26 NOTE — Telephone Encounter (Signed)
Post ED Visit - Positive Culture Follow-up  Culture report reviewed by antimicrobial stewardship pharmacist:  []  [name], Pharm.D. []  [name], Pharm.D., BCPS AQ-ID []  [name], Pharm.D., BCPS [x]  [name], Pharm.D., BCPS []  [name], [address]Pharm.D., BCPS, AAHIVP []  [name], Pharm.D., BCPS, AAHIVP []  [name], PharmD, BCPS []  [name], PharmD, BCPS []  [name], PharmD, BCPS  Positive urine culture Treated with nitrofurantoin, organism sensitive to the same and no further patient follow-up is required at this time.  [name], [name] 09/26/2016, 11:55 AM

## 2016-09-27 DIAGNOSIS — E872 Acidosis, unspecified: Secondary | ICD-10-CM | POA: Insufficient documentation

## 2016-09-27 DIAGNOSIS — E039 Hypothyroidism, unspecified: Secondary | ICD-10-CM | POA: Insufficient documentation

## 2016-09-27 DIAGNOSIS — D61818 Other pancytopenia: Secondary | ICD-10-CM | POA: Insufficient documentation

## 2016-09-27 DIAGNOSIS — N12 Tubulo-interstitial nephritis, not specified as acute or chronic: Secondary | ICD-10-CM | POA: Insufficient documentation

## 2017-03-03 DIAGNOSIS — M259 Joint disorder, unspecified: Secondary | ICD-10-CM | POA: Insufficient documentation

## 2017-04-18 DIAGNOSIS — G8194 Hemiplegia, unspecified affecting left nondominant side: Secondary | ICD-10-CM | POA: Insufficient documentation

## 2017-05-14 DIAGNOSIS — Z1624 Resistance to multiple antibiotics: Secondary | ICD-10-CM | POA: Insufficient documentation

## 2017-09-11 DIAGNOSIS — F439 Reaction to severe stress, unspecified: Secondary | ICD-10-CM | POA: Insufficient documentation

## 2017-09-26 DIAGNOSIS — J392 Other diseases of pharynx: Secondary | ICD-10-CM | POA: Insufficient documentation

## 2017-09-26 DIAGNOSIS — R1310 Dysphagia, unspecified: Secondary | ICD-10-CM | POA: Insufficient documentation

## 2017-10-25 DIAGNOSIS — E041 Nontoxic single thyroid nodule: Secondary | ICD-10-CM | POA: Insufficient documentation

## 2017-10-25 DIAGNOSIS — E079 Disorder of thyroid, unspecified: Secondary | ICD-10-CM | POA: Insufficient documentation

## 2017-10-25 DIAGNOSIS — E039 Hypothyroidism, unspecified: Secondary | ICD-10-CM | POA: Insufficient documentation

## 2017-10-25 DIAGNOSIS — E890 Postprocedural hypothyroidism: Secondary | ICD-10-CM | POA: Insufficient documentation

## 2018-03-09 DIAGNOSIS — I10 Essential (primary) hypertension: Secondary | ICD-10-CM | POA: Insufficient documentation

## 2018-03-10 DIAGNOSIS — F259 Schizoaffective disorder, unspecified: Secondary | ICD-10-CM | POA: Insufficient documentation

## 2018-12-16 DIAGNOSIS — R634 Abnormal weight loss: Secondary | ICD-10-CM | POA: Insufficient documentation

## 2018-12-16 DIAGNOSIS — R531 Weakness: Secondary | ICD-10-CM | POA: Insufficient documentation

## 2018-12-16 DIAGNOSIS — R079 Chest pain, unspecified: Secondary | ICD-10-CM | POA: Insufficient documentation

## 2019-01-12 DIAGNOSIS — F1020 Alcohol dependence, uncomplicated: Secondary | ICD-10-CM | POA: Insufficient documentation

## 2019-01-12 DIAGNOSIS — R81 Glycosuria: Secondary | ICD-10-CM | POA: Insufficient documentation

## 2019-01-12 DIAGNOSIS — E8809 Other disorders of plasma-protein metabolism, not elsewhere classified: Secondary | ICD-10-CM | POA: Insufficient documentation

## 2019-01-12 DIAGNOSIS — Z794 Long term (current) use of insulin: Secondary | ICD-10-CM | POA: Insufficient documentation

## 2019-01-12 DIAGNOSIS — R7989 Other specified abnormal findings of blood chemistry: Secondary | ICD-10-CM | POA: Insufficient documentation

## 2019-01-12 DIAGNOSIS — U071 COVID-19: Secondary | ICD-10-CM | POA: Insufficient documentation

## 2019-01-12 DIAGNOSIS — R17 Unspecified jaundice: Secondary | ICD-10-CM | POA: Insufficient documentation

## 2019-01-12 DIAGNOSIS — D649 Anemia, unspecified: Secondary | ICD-10-CM | POA: Insufficient documentation

## 2019-01-13 DIAGNOSIS — B159 Hepatitis A without hepatic coma: Secondary | ICD-10-CM | POA: Insufficient documentation

## 2019-01-26 ENCOUNTER — Emergency Department (HOSPITAL_COMMUNITY): Payer: Medicare HMO

## 2019-01-26 ENCOUNTER — Other Ambulatory Visit: Payer: Self-pay

## 2019-01-26 ENCOUNTER — Observation Stay (HOSPITAL_COMMUNITY)
Admission: EM | Admit: 2019-01-26 | Discharge: 2019-01-27 | Disposition: A | Discharge status: Home / Self Care | Payer: Medicare HMO | Attending: Internal Medicine | Admitting: Internal Medicine

## 2019-01-26 ENCOUNTER — Encounter (HOSPITAL_COMMUNITY): Payer: Self-pay | Admitting: Emergency Medicine

## 2019-01-26 DIAGNOSIS — E1110 Type 2 diabetes mellitus with ketoacidosis without coma: Secondary | ICD-10-CM | POA: Diagnosis present

## 2019-01-26 DIAGNOSIS — Z882 Allergy status to sulfonamides status: Secondary | ICD-10-CM | POA: Diagnosis not present

## 2019-01-26 DIAGNOSIS — Z79890 Hormone replacement therapy (postmenopausal): Secondary | ICD-10-CM | POA: Diagnosis not present

## 2019-01-26 DIAGNOSIS — U071 COVID-19: Secondary | ICD-10-CM | POA: Diagnosis not present

## 2019-01-26 DIAGNOSIS — Z881 Allergy status to other antibiotic agents status: Secondary | ICD-10-CM | POA: Diagnosis not present

## 2019-01-26 DIAGNOSIS — D696 Thrombocytopenia, unspecified: Secondary | ICD-10-CM | POA: Diagnosis not present

## 2019-01-26 DIAGNOSIS — G4730 Sleep apnea, unspecified: Secondary | ICD-10-CM

## 2019-01-26 DIAGNOSIS — R9431 Abnormal electrocardiogram [ECG] [EKG]: Secondary | ICD-10-CM | POA: Insufficient documentation

## 2019-01-26 DIAGNOSIS — Z886 Allergy status to analgesic agent status: Secondary | ICD-10-CM | POA: Insufficient documentation

## 2019-01-26 DIAGNOSIS — Z885 Allergy status to narcotic agent status: Secondary | ICD-10-CM | POA: Insufficient documentation

## 2019-01-26 DIAGNOSIS — Z791 Long term (current) use of non-steroidal anti-inflammatories (NSAID): Secondary | ICD-10-CM | POA: Insufficient documentation

## 2019-01-26 DIAGNOSIS — K219 Gastro-esophageal reflux disease without esophagitis: Secondary | ICD-10-CM | POA: Diagnosis not present

## 2019-01-26 DIAGNOSIS — Z794 Long term (current) use of insulin: Secondary | ICD-10-CM | POA: Diagnosis not present

## 2019-01-26 DIAGNOSIS — E119 Type 2 diabetes mellitus without complications: Secondary | ICD-10-CM | POA: Diagnosis not present

## 2019-01-26 DIAGNOSIS — E890 Postprocedural hypothyroidism: Secondary | ICD-10-CM | POA: Diagnosis not present

## 2019-01-26 DIAGNOSIS — F410 Panic disorder [episodic paroxysmal anxiety] without agoraphobia: Secondary | ICD-10-CM

## 2019-01-26 DIAGNOSIS — E876 Hypokalemia: Secondary | ICD-10-CM | POA: Insufficient documentation

## 2019-01-26 DIAGNOSIS — Z79899 Other long term (current) drug therapy: Secondary | ICD-10-CM | POA: Diagnosis not present

## 2019-01-26 DIAGNOSIS — R110 Nausea: Secondary | ICD-10-CM

## 2019-01-26 LAB — COMPREHENSIVE METABOLIC PANEL
ALT: <5 U/L (ref 0–44)
AST: 40 U/L (ref 15–41)
Albumin: 2.8 g/dL — ABNORMAL LOW (ref 3.5–5.0)
Alkaline Phosphatase: 157 U/L — ABNORMAL HIGH (ref 38–126)
Anion gap: 25 — ABNORMAL HIGH (ref 5–15)
BUN: <5 mg/dL — ABNORMAL LOW (ref 6–20)
CO2: 16 mmol/L — ABNORMAL LOW (ref 22–32)
Calcium: 8.8 mg/dL — ABNORMAL LOW (ref 8.9–10.3)
Chloride: 83 mmol/L — ABNORMAL LOW (ref 98–111)
Creatinine, Ser: 0.91 mg/dL (ref 0.44–1.00)
GFR calc Af Amer: >60 mL/min (ref >60)
GFR calc non Af Amer: >60 mL/min (ref >60)
Glucose, Bld: 1115 mg/dL (ref 70–99)
Potassium: 2 mmol/L — CL (ref 3.5–5.1)
Sodium: 124 mmol/L — ABNORMAL LOW (ref 135–145)
Total Bilirubin: 6.1 mg/dL — ABNORMAL HIGH (ref 0.3–1.2)
Total Protein: 6.7 g/dL (ref 6.5–8.1)

## 2019-01-26 LAB — URINALYSIS, ROUTINE W REFLEX MICROSCOPIC
Bacteria, UA: NONE SEEN
Bilirubin Urine: NEGATIVE
Glucose, UA: >=500 mg/dL — AB
Ketones, ur: NEGATIVE mg/dL
Leukocytes,Ua: NEGATIVE
Nitrite: NEGATIVE
Protein, ur: NEGATIVE mg/dL
Specific Gravity, Urine: 1.02 (ref 1.005–1.030)
pH: 5 (ref 5.0–8.0)

## 2019-01-26 LAB — BASIC METABOLIC PANEL
Anion gap: 17 — ABNORMAL HIGH (ref 5–15)
BUN: 5 mg/dL — ABNORMAL LOW (ref 6–20)
BUN: <5 mg/dL — ABNORMAL LOW (ref 6–20)
CO2: 18 mmol/L — ABNORMAL LOW (ref 22–32)
CO2: 21 mmol/L — ABNORMAL LOW (ref 22–32)
Calcium: 9.1 mg/dL (ref 8.9–10.3)
Calcium: 8.7 mg/dL — ABNORMAL LOW (ref 8.9–10.3)
Chloride: 91 mmol/L — ABNORMAL LOW (ref 98–111)
Chloride: 98 mmol/L (ref 98–111)
Creatinine, Ser: 0.9 mg/dL (ref 0.44–1.00)
Creatinine, Ser: 0.57 mg/dL (ref 0.44–1.00)
GFR calc Af Amer: >60 mL/min (ref >60)
GFR calc Af Amer: >60 mL/min (ref >60)
GFR calc non Af Amer: >60 mL/min (ref >60)
GFR calc non Af Amer: >60 mL/min (ref >60)
Glucose, Bld: 760 mg/dL (ref 70–99)
Glucose, Bld: 313 mg/dL — ABNORMAL HIGH (ref 70–99)
Potassium: 2 mmol/L — CL (ref 3.5–5.1)
Potassium: 2.8 mmol/L — ABNORMAL LOW (ref 3.5–5.1)
Sodium: 131 mmol/L — ABNORMAL LOW (ref 135–145)
Sodium: 136 mmol/L (ref 135–145)

## 2019-01-26 LAB — CBC
HCT: 39.1 % (ref 36.0–46.0)
Hemoglobin: 11.3 g/dL — ABNORMAL LOW (ref 12.0–15.0)
MCH: 31.8 pg (ref 26.0–34.0)
MCHC: 28.9 g/dL — ABNORMAL LOW (ref 30.0–36.0)
MCV: 110.1 fL — ABNORMAL HIGH (ref 80.0–100.0)
Platelets: 59 10*3/uL — ABNORMAL LOW (ref 150–400)
RBC: 3.55 MIL/uL — ABNORMAL LOW (ref 3.87–5.11)
RDW: 17.2 % — ABNORMAL HIGH (ref 11.5–15.5)
WBC: 14.6 10*3/uL — ABNORMAL HIGH (ref 4.0–10.5)
nRBC: 0 % (ref 0.0–0.2)

## 2019-01-26 LAB — CBG MONITORING, ED
Glucose-Capillary: 205 mg/dL — ABNORMAL HIGH (ref 70–99)
Glucose-Capillary: 217 mg/dL — ABNORMAL HIGH (ref 70–99)
Glucose-Capillary: 325 mg/dL — ABNORMAL HIGH (ref 70–99)
Glucose-Capillary: 395 mg/dL — ABNORMAL HIGH (ref 70–99)
Glucose-Capillary: 467 mg/dL — ABNORMAL HIGH (ref 70–99)
Glucose-Capillary: >600 mg/dL (ref 70–99)
Glucose-Capillary: >600 mg/dL (ref 70–99)

## 2019-01-26 LAB — PHOSPHORUS: Phosphorus: 2 mg/dL — ABNORMAL LOW (ref 2.5–4.6)

## 2019-01-26 LAB — LACTIC ACID, PLASMA: Lactic Acid, Venous: >11 mmol/L (ref 0.5–1.9)

## 2019-01-26 LAB — MAGNESIUM: Magnesium: 2.1 mg/dL (ref 1.7–2.4)

## 2019-01-26 MED ORDER — SODIUM CHLORIDE 0.9 % IV SOLN
Freq: Once | INTRAVENOUS | Status: AC
  Administered 2019-01-26: 14:00:00 via INTRAVENOUS

## 2019-01-26 MED ORDER — SODIUM CHLORIDE 0.9 % IV BOLUS (SEPSIS)
1000.0000 mL | Freq: Once | INTRAVENOUS | Status: AC
  Administered 2019-01-26: 18:00:00 1000 mL via INTRAVENOUS

## 2019-01-26 MED ORDER — POTASSIUM CHLORIDE 10 MEQ/100ML IV SOLN: 10.0000 meq | INTRAVENOUS | Status: DC

## 2019-01-26 MED ORDER — POTASSIUM CHLORIDE 10 MEQ/100ML IV SOLN
10.0000 meq | INTRAVENOUS | Status: AC
  Administered 2019-01-26 – 2019-01-27 (×4): 10 meq via INTRAVENOUS
  Filled 2019-01-26 (×4): qty 100

## 2019-01-26 MED ORDER — INSULIN REGULAR(HUMAN) IN NACL 100-0.9 UT/100ML-% IV SOLN: INTRAVENOUS | Status: DC

## 2019-01-26 MED ORDER — METOCLOPRAMIDE HCL 10 MG PO TABS
10.0000 mg | ORAL_TABLET | Freq: Four times a day (QID) | ORAL | Status: DC
  Administered 2019-01-26 – 2019-01-27 (×3): 10 mg via ORAL
  Filled 2019-01-26 (×7): qty 1

## 2019-01-26 MED ORDER — SODIUM CHLORIDE 0.9 % IV SOLN
INTRAVENOUS | Status: DC
  Administered 2019-01-26: 20:00:00 via INTRAVENOUS

## 2019-01-26 MED ORDER — LEVOTHYROXINE SODIUM 50 MCG PO TABS
50.0000 ug | ORAL_TABLET | Freq: Every day | ORAL | Status: DC
  Administered 2019-01-27: 08:00:00 50 ug via ORAL
  Filled 2019-01-26: qty 1

## 2019-01-26 MED ORDER — SODIUM CHLORIDE 0.9 % IV SOLN
INTRAVENOUS | Status: AC
  Administered 2019-01-26: 20:00:00 via INTRAVENOUS

## 2019-01-26 MED ORDER — POTASSIUM CHLORIDE CRYS ER 20 MEQ PO TBCR
40.0000 meq | EXTENDED_RELEASE_TABLET | Freq: Two times a day (BID) | ORAL | Status: DC
  Administered 2019-01-26 – 2019-01-27 (×2): 40 meq via ORAL
  Filled 2019-01-26 (×2): qty 2

## 2019-01-26 MED ORDER — TRAZODONE HCL 50 MG PO TABS
100.0000 mg | ORAL_TABLET | Freq: Every day | ORAL | Status: DC
  Administered 2019-01-26: 23:00:00 100 mg via ORAL
  Filled 2019-01-26: qty 2

## 2019-01-26 MED ORDER — SERTRALINE HCL 50 MG PO TABS
150.0000 mg | ORAL_TABLET | Freq: Every day | ORAL | Status: DC
  Administered 2019-01-27: 08:00:00 150 mg via ORAL
  Filled 2019-01-26: qty 3

## 2019-01-26 MED ORDER — DEXTROSE-NACL 5-0.45 % IV SOLN: INTRAVENOUS | Status: DC

## 2019-01-26 MED ORDER — DEXTROSE-NACL 5-0.45 % IV SOLN
INTRAVENOUS | Status: DC
  Administered 2019-01-26: 23:00:00 via INTRAVENOUS

## 2019-01-26 MED ORDER — POTASSIUM CHLORIDE 10 MEQ/100ML IV SOLN
10.0000 meq | INTRAVENOUS | Status: DC
  Administered 2019-01-26 (×4): 10 meq via INTRAVENOUS
  Filled 2019-01-26 (×5): qty 100

## 2019-01-26 MED ORDER — INSULIN REGULAR(HUMAN) IN NACL 100-0.9 UT/100ML-% IV SOLN
INTRAVENOUS | Status: DC
  Administered 2019-01-26: 15:00:00 5.4 [IU]/h via INTRAVENOUS
  Filled 2019-01-26: qty 100

## 2019-01-26 MED ORDER — POTASSIUM CHLORIDE CRYS ER 20 MEQ PO TBCR
40.0000 meq | EXTENDED_RELEASE_TABLET | Freq: Once | ORAL | Status: AC
  Administered 2019-01-26: 40 meq via ORAL
  Filled 2019-01-26: qty 2

## 2019-01-26 NOTE — ED Triage Notes (Signed)
C/o nausea for 3 days.  Out of nausea medication.  Glucose >300 this am and took 10 unit of Novolog at 1100.  CBG reads Hi>600 in triage.  Pt has regular Dr Malachi Bonds and instructed not to drink.

## 2019-01-26 NOTE — ED Provider Notes (Signed)
[facility] EMERGENCY DEPARTMENT Provider Note   CSN: [id number] Arrival date & time: 01/26/19  1226     History   Chief Complaint Chief Complaint  Patient presents with  . Nausea    HPI [name] is a 53 y.o. female.     Patient is a 53 year old female who presents to the emergency department with a complaint of nausea.  Patient has a history of diabetes mellitus, chronic obstructive pulmonary disease, liver cirrhosis, thrombocytopenia, bipolar disorder, hypokalemia, and was positive for COVID-19 January 11, 2019.  The patient states this problem has been going on for the last 3 days.  She says she ran out of her Zofran ODT.  She reports that she has been having problems with her stomach.  She states she has a history of cirrhosis.  She says she has had some ascites, but has not been able to get anything drawn off of her abdomen.  The patient says that her glucose has been elevated at home.  She says that from time to time she has a craving for juices or sodas or other sweet things.  She says she has been having increased thirst, and increased urine output.  She is also been feeling tired along with having the nausea.  The history is provided by the patient.    Past Medical History:  Diagnosis Date  . Degenerative joint disease   . Diabetes mellitus without complication (HCC)   . Gastroesophageal reflux   . Panic attack   . Sleep apnea   . Thrombocytopenia (HCC)     There are no active problems to display for this patient.   Past Surgical History:  Procedure Laterality Date  . CHOLECYSTECTOMY    . SHOULDER SURGERY     left   . tongue sx. titanium screw placed to hold tongue down    . TOTAL THYROIDECTOMY       OB History   No obstetric history on file.      Home Medications    Prior to Admission medications   Medication Sig Start Date End Date Taking? Authorizing Provider  empagliflozin (JARDIANCE) 25 MG TABS tablet Take by mouth. 08/25/18  Yes  [provider]  insulin aspart (NOVOLOG FLEXPEN) 100 UNIT/ML FlexPen Inject into the skin See admin instructions. Inject 2 units being the least and 10 units being the most via sliding scale.   Yes [provider]  busPIRone (BUSPAR) 5 MG tablet Take 1 tablet by mouth 3 (three) times daily. 09/15/16   [provider]  gabapentin (NEURONTIN) 600 MG tablet Take 600 mg by mouth 4 (four) times daily. 02/06/15   [provider]  levothyroxine (SYNTHROID, LEVOTHROID) 50 MCG tablet Take 1 tablet by mouth daily. Take at night. 07/25/14   [provider]  meloxicam (MOBIC) 15 MG tablet Take 1 tablet by mouth daily. 09/10/16   [provider]  metFORMIN (GLUCOPHAGE) 500 MG tablet Take 1,000 mg by mouth 2 (two) times daily with a meal.     [provider]  nitrofurantoin, macrocrystal-monohydrate, (MACROBID) 100 MG capsule Take 1 capsule (100 mg total) by mouth 2 (two) times daily. 09/22/16   Vanetta Mulders, MD  omeprazole (PRILOSEC) 40 MG capsule Take 40 mg by mouth daily.    [provider]  ondansetron (ZOFRAN-ODT) 4 MG disintegrating tablet Take 1 tablet by mouth daily as needed for nausea/vomiting. 09/18/16   [provider]  sertraline (ZOLOFT) 100 MG tablet Take 150 mg by mouth daily.  09/10/16   [provider]  traZODone (DESYREL) 100 MG tablet Take 1 tablet by mouth daily. 09/15/16   [provider]    Family History History reviewed. No pertinent family history.  Social History Social History   Tobacco Use  . Smoking status: Never Smoker  . Smokeless tobacco: Never Used  Substance Use Topics  . Alcohol use: No  . Drug use: No     Allergies   Bactrim [sulfamethoxazole-trimethoprim], Ciprofloxacin, Dilaudid [hydromorphone hcl], Keflex [cephalexin], and Tylenol [acetaminophen]   Review of Systems Review of Systems  Constitutional: Positive for fatigue. Negative for activity change and appetite  change.  HENT: Negative for congestion, ear discharge, ear pain, facial swelling, nosebleeds, rhinorrhea, sneezing and tinnitus.   Eyes: Negative for photophobia, pain and discharge.  Respiratory: Negative for cough, choking, shortness of breath and wheezing.   Cardiovascular: Negative for chest pain, palpitations and leg swelling.  Gastrointestinal: Positive for abdominal pain and nausea. Negative for blood in stool, constipation, diarrhea and vomiting.  Genitourinary: Negative for difficulty urinating, dysuria, flank pain, frequency and hematuria.  Musculoskeletal: Negative for back pain, gait problem, myalgias and neck pain.  Skin: Negative for color change, rash and wound.  Neurological: Negative for dizziness, seizures, syncope, facial asymmetry, speech difficulty, weakness and numbness.  Hematological: Negative for adenopathy. Does not bruise/bleed easily.  Psychiatric/Behavioral: Negative for agitation, confusion, hallucinations, self-injury and suicidal ideas. The patient is not nervous/anxious.      Physical Exam Updated Vital Signs BP (!) 147/63 (BP Location: Right Arm)   Pulse (!) 108   Temp 97.8 F (36.6 C)   Resp 16   Ht 5\' 3"  (1.6 m)   Wt 47.6 kg   SpO2 97%   BMI 18.60 kg/m   Physical Exam Vitals signs and nursing note reviewed.  Constitutional:      Appearance: She is well-developed. She is not toxic-appearing.  HENT:     Head: Normocephalic.     Right Ear: Tympanic membrane and external ear normal.     Left Ear: Tympanic membrane and external ear normal.     Mouth/Throat:     Comments: Mucous membranes are dry. Eyes:     General: Lids are normal.     Pupils: Pupils are equal, round, and reactive to light.  Neck:     Musculoskeletal: Normal range of motion and neck supple.     Vascular: No carotid bruit.  Cardiovascular:     Rate and Rhythm: Regular rhythm. Tachycardia present.     Pulses: Normal pulses.     Heart sounds: Normal heart sounds.  Pulmonary:      Effort: No respiratory distress.     Breath sounds: Normal breath sounds.  Abdominal:     General: Bowel sounds are normal.     Palpations: Abdomen is soft.     Tenderness: There is abdominal tenderness in the epigastric area. There is no guarding.  Musculoskeletal: Normal range of motion.  Lymphadenopathy:     Head:     Right side of head: No submandibular adenopathy.     Left side of head: No submandibular adenopathy.     Cervical: No cervical adenopathy.  Skin:    General: Skin is warm and dry.  Neurological:     Mental Status: She is alert and oriented to person, place, and time.     Cranial Nerves: No cranial nerve deficit.     Sensory: No sensory deficit.  Psychiatric:        Speech: Speech  normal.      ED Treatments / Results  Labs (all labs ordered are listed, but only abnormal results are displayed) Labs Reviewed  COMPREHENSIVE METABOLIC PANEL - Abnormal; Notable for the following components:      Result Value   Sodium 124 (*)    Potassium 2.0 (*)    Chloride 83 (*)    CO2 16 (*)    Glucose, Bld 1,115 (*)    BUN <5 (*)    Calcium 8.8 (*)    Albumin 2.8 (*)    Alkaline Phosphatase 157 (*)    Total Bilirubin 6.1 (*)    Anion gap 25 (*)    All other components within normal limits  CBC - Abnormal; Notable for the following components:   WBC 14.6 (*)    RBC 3.55 (*)    Hemoglobin 11.3 (*)    MCV 110.1 (*)    MCHC 28.9 (*)    RDW 17.2 (*)    Platelets 59 (*)    All other components within normal limits  URINALYSIS, ROUTINE W REFLEX MICROSCOPIC - Abnormal; Notable for the following components:   Color, Urine STRAW (*)    Glucose, UA >=500 (*)    Hgb urine dipstick SMALL (*)    All other components within normal limits  CBG MONITORING, ED - Abnormal; Notable for the following components:   Glucose-Capillary >600 (*)    All other components within normal limits    EKG EKG Interpretation  Date/Time:  Saturday January 26 2019 14:22:55 EST  Ventricular Rate:  107 PR Interval:    QRS Duration: 115 QT Interval:  423 QTC Calculation: 565 R Axis:   74 Text Interpretation: Sinus tachycardia Probable left atrial enlargement LVH with IVCD and secondary repol abnrm ST depr, consider ischemia, inferior leads Prolonged QT interval No STEMI Confirmed by Alona Bene [phone]) on 01/26/2019 2:26:44 PM   Radiology No results found.  Procedures Procedures (including critical care time) CRITICAL CARE Performed by: Ivery Quale Total critical care time: **35* minutes Critical care time was exclusive of separately billable procedures and treating other patients. Critical care was necessary to treat or prevent imminent or life-threatening deterioration. Critical care was time spent personally by me on the following activities: development of treatment plan with patient and/or surrogate as well as nursing, discussions with consultants, evaluation of patient's response to treatment, examination of patient, obtaining history from patient or surrogate, ordering and performing treatments and interventions, ordering and review of laboratory studies, ordering and review of radiographic studies, pulse oximetry and re-evaluation of patient's condition.  Medications Ordered in ED Medications  0.9 %  sodium chloride infusion (has no administration in time range)     Initial Impression / Assessment and Plan / ED Course  I have reviewed the triage vital signs and the nursing notes.  Pertinent labs & imaging results that were available during my care of the patient were reviewed by me and considered in my medical decision making (see chart for details).          Final Clinical Impressions(s) / ED Diagnoses MDM  Temperature is 97.8.  Pulse rate elevated at 108, blood pressure is elevated at 147/63.  Pulse oximetry is within normal limits by my interpretation at 97% on room air.  The patient complains of feeling tired and weak.  The pulse  oximetry was greater than 600.  IV fluids started. Complete blood count shows the WBC to be 14,600.  The platelets are low at 59,000, of  the patient has had problems with thrombocytopenia in the past and has had similar readings. Chest x-ray shows bandlike scarring or atelectasis at the left lung base, but no acute airspace opacity.  The patient was treated with antiemetics as she was complaining of nausea.  The comprehensive metabolic panel shows the sodium to be low at 124, potassium low at 2.0-critical.  IV potassium started.  The CO2 is low at 16, the glucose is elevated at 1115.  The patient gives history that she has been drinking juices and use Dr. Malachi Bonds soda the alkaline phosphatase is elevated at 157, the total bilirubin is elevated at 6.1.  The patient has a history of liver cirrhosis.  The anion gap is Elevated at 25.  Case discussed and patient seen with me by Dr. Laverta Baltimore. Glucose stabilizer initiated. Patient states the nausea feels better and oral potassium was also ordered.  Lactic acid is elevated at greater than 11.  Case discussed with hospitalist.  Patient to be admitted for diabetic ketoacidosis and hypokalemia.  Patient was diagnosed with COVID-19 on October 23.  Repeat Covid test pending.   Final diagnoses:  Nausea  Prolonged Q-T interval on ECG  Diabetic ketoacidosis without coma associated with type 2 diabetes mellitus [facility])  Hypokalemia    ED Discharge Orders    None       Lily Kocher, PA-C 01/27/19 0831    Margette Fast, MD 01/27/19 [phone]

## 2019-01-26 NOTE — ED Notes (Signed)
CRITICAL VALUE ALERT  Critical Value:  Potassium 2.0, Glucose 1,115  Date & Time Notied:  01/26/2019, 1437  Provider Notified: Sima Matas, PA  Orders Received/Actions taken: none

## 2019-01-26 NOTE — H&P (Addendum)
History and Physical  [name]:[id number] DOB: [birth year] DOA: 01/26/2019  Referring physician: Lily Kocher, PA-C, ED provider PCP: [facility] [place]  Outpatient Specialists:   Patient Coming From: home  Chief Complaint: elevated CBG  HPI: [name] is a 53 y.o. female with a history of diabetes, thrombocytopenia, sleep apnea, GERD.  Patient seen for elevated blood sugars and nausea and vomiting.  She was having difficulty keeping oral food and liquids down.  Her blood sugars were to 50s this morning and elevated to over 600 after she woke up.  She does feel fatigued and decreased energy.  She denies diarrhea, constipation, fevers, cough, shortness of breath.  She had been diagnosed with coronavirus 10/21, but she denies any major symptoms.  She is unable to tell me when she last had symptoms similar to coronavirus.  Emergency Department Course: CMP shows potassium of 2.0.  Glucose on c-Met elevated to 1100.  IV fluid bolus had not been started when I arrived to see the patient.  Patient had been started on insulin and maintenance fluids.  2 runs of IV potassium had been given as well as 40 p.o.  Review of Systems:   Pt denies any fevers, chills, nausea, vomiting, diarrhea, constipation, abdominal pain, shortness of breath, dyspnea on exertion, orthopnea, cough, wheezing, palpitations, headache, vision changes, lightheadedness, dizziness, melena, rectal bleeding.  Review of systems are otherwise negative  Past Medical History:  Diagnosis Date   Degenerative joint disease    Diabetes mellitus without complication ([place])    Gastroesophageal reflux    Panic attack    Sleep apnea    Thrombocytopenia (HCC)    Past Surgical History:  Procedure Laterality Date   CHOLECYSTECTOMY     SHOULDER SURGERY     left    tongue sx. titanium screw placed to hold tongue down     TOTAL THYROIDECTOMY     Social History:  reports that she has never smoked. She  has never used smokeless tobacco. She reports that she does not drink alcohol or use drugs. Patient lives at home  Allergies  Allergen Reactions   Bactrim [Sulfamethoxazole-Trimethoprim] Swelling   Ciprofloxacin    Dilaudid [Hydromorphone Hcl]    Keflex [Cephalexin]    Tylenol [Acetaminophen]     History reviewed. No pertinent family history.  Patient unaware of any other family history  Prior to Admission medications   Medication Sig Start Date End Date Taking? Authorizing Provider  albuterol (VENTOLIN HFA) 108 (90 Base) MCG/ACT inhaler Inhale 1-2 puffs into the lungs every 4 (four) hours as needed for wheezing or shortness of breath.  01/11/19  Yes [provider]  empagliflozin (JARDIANCE) 25 MG TABS tablet Take 25 mg by mouth daily.  08/25/18  Yes [provider]  furosemide (LASIX) 20 MG tablet Take 20 mg by mouth daily.  09/23/18  Yes [provider]  insulin aspart (NOVOLOG FLEXPEN) 100 UNIT/ML FlexPen Inject 2-10 Units into the skin See admin instructions. Inject 2 units being the least and 10 units being the most via sliding scale.   Yes [provider]  levothyroxine (SYNTHROID, LEVOTHROID) 50 MCG tablet Take 1 tablet by mouth daily. Take at night. 07/25/14  Yes [provider]  meloxicam (MOBIC) 15 MG tablet Take 15 mg by mouth daily.  09/10/16  Yes [provider]  metFORMIN (GLUCOPHAGE) 1000 MG tablet Take 1,000 mg by mouth 2 (two) times daily with a meal.    Yes [provider]  metoCLOPramide (  REGLAN) 10 MG tablet Take 10 mg by mouth 4 (four) times daily.    Yes [provider]  ondansetron (ZOFRAN-ODT) 4 MG disintegrating tablet Take 1 tablet by mouth 2 (two) times daily.  09/18/16  Yes [provider]  sertraline (ZOLOFT) 100 MG tablet Take 150 mg by mouth daily. 09/10/16   [provider]  traZODone (DESYREL) 100 MG tablet Take 1 tablet by mouth daily. 09/15/16   [provider]     Physical Exam: BP (!) 143/67    Pulse (!) 107    Temp 97.8 F (36.6 C)    Resp 17    Ht 5' 3"  (1.6 m)    Wt 47.6 kg    SpO2 99%    BMI 18.60 kg/m    General: Middle-age female. Awake and alert and oriented x3. No acute cardiopulmonary distress.   HEENT: Normocephalic atraumatic.  Right and left ears normal in appearance.  Pupils equal, round, reactive to light. Extraocular muscles are intact. Sclerae anicteric and noninjected.  Moist mucosal membranes. No mucosal lesions.   Neck: Neck supple without lymphadenopathy. No carotid bruits. No masses palpated.   Cardiovascular: Regular rate with normal S1-S2 sounds. No murmurs, rubs, gallops auscultated. No JVD.   Respiratory: Good respiratory effort with no wheezes, rales, rhonchi. Lungs clear to auscultation bilaterally.  No accessory muscle use.  Abdomen: Soft, nontender, nondistended. Active bowel sounds. No masses or hepatosplenomegaly   Skin: No rashes, lesions, or ulcerations.  Dry, warm to touch. 2+ dorsalis pedis and radial pulses.  Musculoskeletal: No calf or leg pain. All major joints not erythematous nontender.  No upper or lower joint deformation.  Good ROM.  No contractures   Psychiatric: Intact judgment and insight. Pleasant and cooperative.  Neurologic: No focal neurological deficits. Strength is 5/5 and symmetric in upper and lower extremities.  Cranial nerves II through XII are grossly intact.           Labs on Admission: I have personally reviewed following labs and imaging studies  CBC: Recent Labs  Lab 01/26/19 1335  WBC 14.6*  HGB 11.3*  HCT 39.1  MCV 110.1*  PLT 59*   Basic Metabolic Panel: Recent Labs  Lab 01/26/19 1335 01/26/19 1659  NA 124* 131*  K 2.0* 2.0*  CL 83* 91*  CO2 16* 18*  GLUCOSE 1,115* 760*  BUN <5* 5*  CREATININE 0.91 0.90  CALCIUM 8.8* 9.1  MG 2.1  --    GFR: Estimated Creatinine Clearance: 53.1 mL/min (by C-G formula based on SCr of 0.9 mg/dL). Liver Function  Tests: Recent Labs  Lab 01/26/19 1335  AST 40  ALT <5  ALKPHOS 157*  BILITOT 6.1*  PROT 6.7  ALBUMIN 2.8*   No results for input(s): LIPASE, AMYLASE in the last 168 hours. No results for input(s): AMMONIA in the last 168 hours. Coagulation Profile: No results for input(s): INR, PROTIME in the last 168 hours. Cardiac Enzymes: No results for input(s): CKTOTAL, CKMB, CKMBINDEX, TROPONINI in the last 168 hours. BNP (last 3 results) No results for input(s): PROBNP in the last 8760 hours. HbA1C: No results for input(s): HGBA1C in the last 72 hours. CBG: Recent Labs  Lab 01/26/19 1253 01/26/19 1727  GLUCAP >600* >600*   Lipid Profile: No results for input(s): CHOL, HDL, LDLCALC, TRIG, CHOLHDL, LDLDIRECT in the last 72 hours. Thyroid Function Tests: No results for input(s): TSH, T4TOTAL, FREET4, T3FREE, THYROIDAB in the last 72 hours. Anemia Panel: No results for input(s): VITAMINB12, FOLATE, FERRITIN,  TIBC, IRON, RETICCTPCT in the last 72 hours. Urine analysis:    Component Value Date/Time   COLORURINE STRAW (A) 01/26/2019 1310   APPEARANCEUR CLEAR 01/26/2019 1310   LABSPEC 1.020 01/26/2019 1310   PHURINE 5.0 01/26/2019 1310   GLUCOSEU >=500 (A) 01/26/2019 1310   HGBUR SMALL (A) 01/26/2019 1310   BILIRUBINUR NEGATIVE 01/26/2019 1310   KETONESUR NEGATIVE 01/26/2019 1310   PROTEINUR NEGATIVE 01/26/2019 1310   UROBILINOGEN 0.2 04/13/2007 1545   NITRITE NEGATIVE 01/26/2019 1310   LEUKOCYTESUR NEGATIVE 01/26/2019 1310   Sepsis Labs: @LABRCNTIP (procalcitonin:4,lacticidven:4) )No results found for this or any previous visit (from the past 240 hour(s)).   Radiological Exams on Admission: Dg Chest Portable 1 View  Result Date: 01/26/2019 CLINICAL DATA:  Nausea, hyperglycemia, hyponatremia EXAM: PORTABLE CHEST 1 VIEW COMPARISON:  01/11/2019 FINDINGS: The heart size and mediastinal contours are within normal limits. Bandlike scarring or atelectasis of the left lung base. No  acute appearing airspace opacity. The visualized skeletal structures are unremarkable. IMPRESSION: 1. Bandlike scarring or atelectasis of the left lung base. 2.  No acute appearing airspace opacity. Electronically Signed   By: Eddie Candle M.D.   On: 01/26/2019 15:32    EKG: Independently reviewed.  This tachycardia with LVH and left atrial enlargement.  No acute ST changes  Assessment/Plan: Active Problems:   DKA (diabetic ketoacidoses) (HCC)   Diabetes mellitus without complication ([place])   Sleep apnea   Gastroesophageal reflux   Panic attack   Thrombocytopenia ([place])    This patient was discussed with the ED physician, including pertinent vitals, physical exam findings, labs, and imaging.  We also discussed care given by the ED provider.  1. DKA a. Admit to stepdown b. Coronavirus pending -if positive positive, she will need to go to either [facility] or [facility]. admit to stepdown Bolus IVF x 2L Telemetry monitoring Continue aggressive IV hydration CBGs every hour Basic metabolic panel every 4 hours Potassium replacement: 10 mEq IV runs  4 in addition to 40 mEq p.o. twice daily Noncaloric liquids Transition to D5 normal saline at 150 mL's per hour with CBG at 250 Transition to home insulin regimen when gap is closed and acidosis resolved. 2. Diabetes type 2 3. Sleep apnea 4. GERD a. Home regimen 5. Thrombocytopenia a. SCDs and lieu of pharmacological prophylaxis 6. Panic attacks a. Home regimen  DVT prophylaxis: SCDs Consultants: None Code Status: Full code Family Communication: None Disposition Plan: Patient should be able to return home following stabilization of blood sugars and resolution of DKA   Truett Mainland, DO

## 2019-01-26 NOTE — ED Notes (Addendum)
Date and time results received: 01/26/19 1754 (use smartphrase ".now" to insert current time)  Test: potassium 2.0 Glucose 760 Critical Value: see above  Name of Provider Notified:Stinson, md  Orders Received? Or Actions Taken?:

## 2019-01-27 ENCOUNTER — Encounter (HOSPITAL_COMMUNITY): Payer: Self-pay | Admitting: *Deleted

## 2019-01-27 DIAGNOSIS — E876 Hypokalemia: Secondary | ICD-10-CM

## 2019-01-27 DIAGNOSIS — Z794 Long term (current) use of insulin: Secondary | ICD-10-CM | POA: Diagnosis not present

## 2019-01-27 DIAGNOSIS — E1110 Type 2 diabetes mellitus with ketoacidosis without coma: Secondary | ICD-10-CM | POA: Diagnosis present

## 2019-01-27 DIAGNOSIS — Z882 Allergy status to sulfonamides status: Secondary | ICD-10-CM | POA: Diagnosis not present

## 2019-01-27 DIAGNOSIS — D696 Thrombocytopenia, unspecified: Secondary | ICD-10-CM | POA: Diagnosis not present

## 2019-01-27 DIAGNOSIS — G4730 Sleep apnea, unspecified: Secondary | ICD-10-CM | POA: Diagnosis not present

## 2019-01-27 DIAGNOSIS — K219 Gastro-esophageal reflux disease without esophagitis: Secondary | ICD-10-CM | POA: Diagnosis not present

## 2019-01-27 DIAGNOSIS — Z79890 Hormone replacement therapy (postmenopausal): Secondary | ICD-10-CM | POA: Diagnosis not present

## 2019-01-27 DIAGNOSIS — Z886 Allergy status to analgesic agent status: Secondary | ICD-10-CM | POA: Diagnosis not present

## 2019-01-27 DIAGNOSIS — R9431 Abnormal electrocardiogram [ECG] [EKG]: Secondary | ICD-10-CM | POA: Diagnosis not present

## 2019-01-27 DIAGNOSIS — U071 COVID-19: Secondary | ICD-10-CM | POA: Diagnosis not present

## 2019-01-27 DIAGNOSIS — Z79899 Other long term (current) drug therapy: Secondary | ICD-10-CM | POA: Diagnosis not present

## 2019-01-27 DIAGNOSIS — Z881 Allergy status to other antibiotic agents status: Secondary | ICD-10-CM | POA: Diagnosis not present

## 2019-01-27 DIAGNOSIS — E890 Postprocedural hypothyroidism: Secondary | ICD-10-CM | POA: Diagnosis not present

## 2019-01-27 DIAGNOSIS — Z885 Allergy status to narcotic agent status: Secondary | ICD-10-CM | POA: Diagnosis not present

## 2019-01-27 DIAGNOSIS — Z791 Long term (current) use of non-steroidal anti-inflammatories (NSAID): Secondary | ICD-10-CM | POA: Diagnosis not present

## 2019-01-27 LAB — HEMOGLOBIN A1C
Hgb A1c MFr Bld: 9 % — ABNORMAL HIGH (ref 4.8–5.6)
Mean Plasma Glucose: 211.6 mg/dL

## 2019-01-27 LAB — BASIC METABOLIC PANEL
Anion gap: 5 (ref 5–15)
Anion gap: 6 (ref 5–15)
BUN: 5 mg/dL — ABNORMAL LOW (ref 6–20)
BUN: 9 mg/dL (ref 6–20)
CO2: 26 mmol/L (ref 22–32)
CO2: 27 mmol/L (ref 22–32)
Calcium: 8.2 mg/dL — ABNORMAL LOW (ref 8.9–10.3)
Calcium: 8.9 mg/dL (ref 8.9–10.3)
Chloride: 102 mmol/L (ref 98–111)
Chloride: 99 mmol/L (ref 98–111)
Creatinine, Ser: 0.45 mg/dL (ref 0.44–1.00)
Creatinine, Ser: 0.44 mg/dL (ref 0.44–1.00)
GFR calc Af Amer: >60 mL/min (ref >60)
GFR calc Af Amer: >60 mL/min (ref >60)
GFR calc non Af Amer: >60 mL/min (ref >60)
GFR calc non Af Amer: >60 mL/min (ref >60)
Glucose, Bld: 140 mg/dL — ABNORMAL HIGH (ref 70–99)
Glucose, Bld: 204 mg/dL — ABNORMAL HIGH (ref 70–99)
Potassium: 3.7 mmol/L (ref 3.5–5.1)
Potassium: 4.5 mmol/L (ref 3.5–5.1)
Sodium: 133 mmol/L — ABNORMAL LOW (ref 135–145)
Sodium: 132 mmol/L — ABNORMAL LOW (ref 135–145)

## 2019-01-27 LAB — GLUCOSE, CAPILLARY
Glucose-Capillary: 130 mg/dL — ABNORMAL HIGH (ref 70–99)
Glucose-Capillary: 169 mg/dL — ABNORMAL HIGH (ref 70–99)
Glucose-Capillary: 166 mg/dL — ABNORMAL HIGH (ref 70–99)
Glucose-Capillary: 140 mg/dL — ABNORMAL HIGH (ref 70–99)
Glucose-Capillary: 211 mg/dL — ABNORMAL HIGH (ref 70–99)
Glucose-Capillary: 244 mg/dL — ABNORMAL HIGH (ref 70–99)

## 2019-01-27 LAB — CBG MONITORING, ED
Glucose-Capillary: 185 mg/dL — ABNORMAL HIGH (ref 70–99)
Glucose-Capillary: 150 mg/dL — ABNORMAL HIGH (ref 70–99)
Glucose-Capillary: 132 mg/dL — ABNORMAL HIGH (ref 70–99)
Glucose-Capillary: 131 mg/dL — ABNORMAL HIGH (ref 70–99)
Glucose-Capillary: 127 mg/dL — ABNORMAL HIGH (ref 70–99)

## 2019-01-27 LAB — SARS CORONAVIRUS 2 (TAT 6-24 HRS): SARS Coronavirus 2: POSITIVE — AB

## 2019-01-27 LAB — HIV ANTIBODY (ROUTINE TESTING W REFLEX): HIV Screen 4th Generation wRfx: NONREACTIVE

## 2019-01-27 MED ORDER — INSULIN GLARGINE 100 UNIT/ML ~~LOC~~ SOLN
10.0000 [IU] | Freq: Every day | SUBCUTANEOUS | Status: DC
  Administered 2019-01-27: 10:00:00 10 [IU] via SUBCUTANEOUS
  Filled 2019-01-27: qty 0.1

## 2019-01-27 MED ORDER — TOUJEO MAX SOLOSTAR 300 UNIT/ML ~~LOC~~ SOPN: 10.0000 [IU] | PEN_INJECTOR | Freq: Every day | SUBCUTANEOUS | 1 refills | Status: DC

## 2019-01-27 MED ORDER — INSULIN ASPART 100 UNIT/ML ~~LOC~~ SOLN
0.0000 [IU] | Freq: Three times a day (TID) | SUBCUTANEOUS | Status: DC
  Administered 2019-01-27 (×2): 5 [IU] via SUBCUTANEOUS

## 2019-01-27 MED ORDER — POTASSIUM CHLORIDE IN NACL 20-0.9 MEQ/L-% IV SOLN
INTRAVENOUS | Status: AC
  Administered 2019-01-27: 09:00:00 via INTRAVENOUS
  Filled 2019-01-27: qty 1000

## 2019-01-27 MED ORDER — INSULIN ASPART 100 UNIT/ML ~~LOC~~ SOLN: 0.0000 [IU] | Freq: Every day | SUBCUTANEOUS | Status: DC

## 2019-01-27 NOTE — Care Management CC44 (Signed)
Condition Code 44 Documentation Completed  Patient Details  Name: [name] MRN: [id number] Date of Birth: 07-17-1965   Condition Code 44 given:  Yes Patient signature on Condition Code 44 notice:  Yes(pt signed copy provided at the bedside- and bedside RN placed in shadow chart) Documentation of 2 MD's agreement:  Yes Code 44 added to claim:  Yes    [name] Romeo Rabon, RN 01/27/2019, 5:21 PM

## 2019-01-27 NOTE — Discharge Summary (Addendum)
FELIZ HERARD, is a 53 y.o. female  DOB [birth year]  MRN [id number].  Admission date:  01/26/2019  Admitting Physician  Truett Mainland, DO  Discharge Date:  01/27/2019   Primary MD  [facility] [place]  Recommendations for primary care physician for things to follow:  -Recheck CBC, CMP during next visit -Please continue adjustment of diabetes mellitus regimen, she is being discharged on Toujeo(which was her endocrinologist recommendation in April of this year)   Admission Diagnosis  Nausea [R11.0] Prolonged Q-T interval on ECG [R94.31] Diabetic ketoacidosis without coma associated with type 2 diabetes mellitus ([place]) [E11.10] DKA (diabetic ketoacidoses) ([place]) [E11.10]   Discharge Diagnosis  Nausea [R11.0] Prolonged Q-T interval on ECG [R94.31] Diabetic ketoacidosis without coma associated with type 2 diabetes mellitus ([place]) [E11.10] DKA (diabetic ketoacidoses) ([place]) [E11.10]    Active Problems:   DKA (diabetic ketoacidoses) ([place])   Diabetes mellitus without complication ([place])   Sleep apnea   Gastroesophageal reflux   Panic attack   Thrombocytopenia ([place])      Past Medical History:  Diagnosis Date  . Degenerative joint disease   . Diabetes mellitus without complication ([place])   . Gastroesophageal reflux   . Panic attack   . Sleep apnea   . Thrombocytopenia ([place])     Past Surgical History:  Procedure Laterality Date  . CHOLECYSTECTOMY    . SHOULDER SURGERY     left   . tongue sx. titanium screw placed to hold tongue down    . TOTAL THYROIDECTOMY         History of present illness and  Hospital Course:     Kindly see H&P for history of present illness and admission details, please review complete Labs, Consult reports and Test reports for all details in brief  HPI  from the history and physical done on the day of admission 01/26/2019  [name] is a 53 y.o. female  with a history of diabetes, thrombocytopenia, sleep apnea, GERD.  Patient seen for elevated blood sugars and nausea and vomiting.  She was having difficulty keeping oral food and liquids down.  Her blood sugars were to 50s this morning and elevated to over 600 after she woke up.  She does feel fatigued and decreased energy.  She denies diarrhea, constipation, fevers, cough, shortness of breath.  She had been diagnosed with coronavirus 10/21, but she denies any major symptoms.  She is unable to tell me when she last had symptoms similar to coronavirus.  Emergency Department Course: CMP shows potassium of 2.0.  Glucose on c-Met elevated to 1100.  IV fluid bolus had not been started when I arrived to see the patient.  Patient had been started on insulin and maintenance fluids.  2 runs of IV potassium had been given as well as 40 p.o.  Hospital Course    Poorly controlled diabetes mellitus with DKA -poorly controlled with A1c 9 this admission, with DKA on presentation, bicarb of 16, anion gap of 25, and elevated lactic acid of 11 she  was admitted under DKA protocol, n.p.o., IV fluid and started on insulin drip, and anion gap closed this morning, he was able to tolerate oral intake, with nausea nausea or vomiting, kept on insulin sliding scale, and started on Toujeo 10 units subcu daily(he had endocrinologist visit last April who recommended to start Toujeo 10 units subcu daily), he was instructed to continue the rest of her medications include NovoLog sliding scale, Metformin and Jardiance.  Severe hypokalemia -Potassium was low at 2 on presentation, repleted, it is 4.5 on discharge.  Covid 19 infection -Patient with recent hospitalization at [facility] for COVID-19 which she was treated with steroids and remdesivir, she tested positive again on admission so she was admitted to [facility], there is no hypoxia, or any respiratory symptoms this admission.  Thrombocytopenia -Platelet count was 59K  , this is most likely in the setting with known liver disease, this appears to be chronic, with baseline MDM mid 40s, most recent 40 4K 10/28 2020 on care everywhere  Hypothyroidism -Continue with Synthroid      Discharge Condition:  Stable  Follow UP  [place] Follow up in 1 week(s).   Contact information: [place] [id number] [phone]             Discharge Instructions  and  Discharge Medications     Discharge Instructions    Discharge instructions   Complete by: As directed    Follow with Primary MD [facility] [place] in 7 days   Get CBC, CMP, checked  by Primary MD next visit.    Activity: As tolerated with Full fall precautions use walker/cane & assistance as needed   Disposition Home    Diet: Heart Healthy /carb modified, with feeding assistance and aspiration precautions.  For Heart failure patients - Check your Weight same time everyday, if you gain over 2 pounds, or you develop in leg swelling, experience more shortness of breath or chest pain, call your Primary MD immediately. Follow Cardiac Low Salt Diet and 1.5 lit/day fluid restriction.   On your next visit with your primary care physician please Get Medicines reviewed and adjusted.   Please request your Prim.MD to go over all Hospital Tests and Procedure/Radiological results at the follow up, please get all Hospital records sent to your Prim MD by signing hospital release before you go home.   If you experience worsening of your admission symptoms, develop shortness of breath, life threatening emergency, suicidal or homicidal thoughts you must seek medical attention immediately by calling 911 or calling your MD immediately  if symptoms less severe.  You Must read complete instructions/literature along with all the possible adverse reactions/side effects for all the Medicines you take and that have been prescribed to you. Take  any new Medicines after you have completely understood and accpet all the possible adverse reactions/side effects.   Do not drive, operating heavy machinery, perform activities at heights, swimming or participation in water activities or provide baby sitting services if your were admitted for syncope or siezures until you have seen by Primary MD or a Neurologist and advised to do so again.  Do not drive when taking Pain medications.    Do not take more than prescribed Pain, Sleep and Anxiety Medications  Special Instructions: If you have smoked or chewed Tobacco  in the last 2 yrs please stop smoking, stop any regular Alcohol  and or any Recreational drug use.  Wear Seat belts  while driving.   Please note  You were cared for by a hospitalist during your hospital stay. If you have any questions about your discharge medications or the care you received while you were in the hospital after you are discharged, you can call the unit and asked to speak with the hospitalist on call if the hospitalist that took care of you is not available. Once you are discharged, your primary care physician will handle any further medical issues. Please note that NO REFILLS for any discharge medications will be authorized once you are discharged, as it is imperative that you return to your primary care physician (or establish a relationship with a primary care physician if you do not have one) for your aftercare needs so that they can reassess your need for medications and monitor your lab values.   Increase activity slowly   Complete by: As directed      Allergies as of 01/27/2019      Reactions   Bactrim [sulfamethoxazole-trimethoprim] Swelling   Ciprofloxacin    Dilaudid [hydromorphone Hcl]    Keflex [cephalexin]    Tylenol [acetaminophen]       Medication List    STOP taking these medications   furosemide 20 MG tablet Commonly known as: LASIX   meloxicam 15 MG tablet Commonly known as: MOBIC      TAKE these medications   albuterol 108 (90 Base) MCG/ACT inhaler Commonly known as: VENTOLIN HFA Inhale 1-2 puffs into the lungs every 4 (four) hours as needed for wheezing or shortness of breath.   Jardiance 25 MG Tabs tablet Generic drug: empagliflozin Take 25 mg by mouth daily.   levothyroxine 50 MCG tablet Commonly known as: SYNTHROID Take 1 tablet by mouth daily. Take at night.   metFORMIN 1000 MG tablet Commonly known as: GLUCOPHAGE Take 1,000 mg by mouth 2 (two) times daily with a meal.   metoCLOPramide 10 MG tablet Commonly known as: REGLAN Take 10 mg by mouth 4 (four) times daily.   NovoLOG FlexPen 100 UNIT/ML FlexPen Generic drug: insulin aspart Inject 2-10 Units into the skin See admin instructions. Inject 2 units being the least and 10 units being the most via sliding scale.   ondansetron 4 MG disintegrating tablet Commonly known as: ZOFRAN-ODT Take 1 tablet by mouth 2 (two) times daily.   sertraline 100 MG tablet Commonly known as: ZOLOFT Take 150 mg by mouth daily.   Toujeo Max SoloStar 300 UNIT/ML Sopn Generic drug: Insulin Glargine (2 Unit Dial) Inject 10 Units into the skin daily.   traZODone 100 MG tablet Commonly known as: DESYREL Take 1 tablet by mouth daily.         Diet and Activity recommendation: See Discharge Instructions above   Consults obtained - none   Major procedures and Radiology Reports - PLEASE review detailed and final reports for all details, in brief -      Dg Chest Portable 1 View  Result Date: 01/26/2019 CLINICAL DATA:  Nausea, hyperglycemia, hyponatremia EXAM: PORTABLE CHEST 1 VIEW COMPARISON:  01/11/2019 FINDINGS: The heart size and mediastinal contours are within normal limits. Bandlike scarring or atelectasis of the left lung base. No acute appearing airspace opacity. The visualized skeletal structures are unremarkable. IMPRESSION: 1. Bandlike scarring or atelectasis of the left lung base. 2.  No acute appearing  airspace opacity. Electronically Signed   By: Eddie Candle M.D.   On: 01/26/2019 15:32    Micro Results     Recent Results (from the past 240  hour(s))  SARS CORONAVIRUS 2 (TAT 6-24 HRS) Nasopharyngeal Nasopharyngeal Swab     Status: Abnormal   Collection Time: 01/26/19  4:03 PM   Specimen: Nasopharyngeal Swab  Result Value Ref Range Status   SARS Coronavirus 2 POSITIVE (A) NEGATIVE Final    Comment: RESULT CALLED TO, READ BACK BY AND VERIFIED WITH: M. POINDEXTER,RN 0239 01/27/2019 T. TYSOR (NOTE) SARS-CoV-2 target nucleic acids are DETECTED. The SARS-CoV-2 RNA is generally detectable in upper and lower respiratory specimens during the acute phase of infection. Positive results are indicative of active infection with SARS-CoV-2. Clinical  correlation with patient history and other diagnostic information is necessary to determine patient infection status. Positive results do  not rule out bacterial infection or co-infection with other viruses. The expected result is Negative. Fact Sheet for Patients: SugarRoll.be Fact Sheet for Healthcare Providers: https://www.woods-mathews.com/ This test is not yet approved or cleared by the [place] FDA and  has been authorized for detection and/or diagnosis of SARS-CoV-2 by FDA under an Emergency Use Authorization (EUA). This EUA will remain  in effect (meaning this test can be used)  for the duration of the COVID-19 declaration under Section 564(b)(1) of the Act, 21 U.S.C. section 360bbb-3(b)(1), unless the authorization is terminated or revoked sooner. Performed at [facility], [place] [address] [place],  [address]        Today   Subjective:   Manha Amato today has no headache,no chest or abdominal pain, she denies any dyspnea as well, no further nausea or vomiting and was able to tolerate her diet.  Objective:   Blood pressure (!) 108/45, pulse (!) 109, temperature  98.2 F (36.8 C), temperature source Oral, resp. rate 15, height _0  (1.6 m), weight 59.3 kg, SpO2 91 %.   Intake/Output Summary (Last 24 hours) at 01/27/2019 1614 Last data filed at 01/27/2019 1400 Gross per 24 hour  Intake 4338.57 ml  Output 1000 ml  Net 3338.57 ml    Exam Awake Alert, Oriented x 3, No new F.N deficits, Normal affect Symmetrical Chest wall movement, Good air movement bilaterally, CTAB RRR,No Gallops,Rubs or new Murmurs, No Parasternal Heave +ve B.Sounds, Abd Soft, Non tender,  No rebound -guarding or rigidity. No Cyanosis, Clubbing or edema, No new Rash or bruise  Data Review   CBC w Diff:  Lab Results  Component Value Date   WBC 14.6 (H) 01/26/2019   HGB 11.3 (L) 01/26/2019   HCT 39.1 01/26/2019   PLT 59 (L) 01/26/2019   LYMPHOPCT 29 09/22/2016   MONOPCT 11 09/22/2016   EOSPCT 6 09/22/2016   BASOPCT 1 09/22/2016    CMP:  Lab Results  Component Value Date   NA 132 (L) 01/27/2019   K 4.5 01/27/2019   CL 99 01/27/2019   CO2 27 01/27/2019   BUN 9 01/27/2019   CREATININE 0.44 01/27/2019   PROT 6.7 01/26/2019   ALBUMIN 2.8 (L) 01/26/2019   BILITOT 6.1 (H) 01/26/2019   ALKPHOS 157 (H) 01/26/2019   AST 40 01/26/2019   ALT <5 01/26/2019  .   Total Time in preparing paper work, data evaluation and todays exam - 63 minutes  Phillips Climes M.D on 01/27/2019 at [facility]   Office  [phone]

## 2019-01-27 NOTE — Discharge Instructions (Signed)
Person Under Monitoring Name: [name]  Location: [address] [place] [address]   Infection Prevention Recommendations for Individuals Confirmed to have, or Being Evaluated for, 2019 Novel Coronavirus (COVID-19) Infection Who Receive Care at Home  Individuals who are confirmed to have, or are being evaluated for, COVID-19 should follow the prevention steps below until a healthcare provider or local or state health department says they can return to normal activities.  Stay home except to get medical care You should restrict activities outside your home, except for getting medical care. Do not go to work, school, or public areas, and do not use public transportation or taxis.  Call ahead before visiting your doctor Before your medical appointment, call the healthcare provider and tell them that you have, or are being evaluated for, COVID-19 infection. This will help the healthcare providers office take steps to keep other people from getting infected. Ask your healthcare provider to call the local or state health department.  Monitor your symptoms Seek prompt medical attention if your illness is worsening (e.g., difficulty breathing). Before going to your medical appointment, call the healthcare provider and tell them that you have, or are being evaluated for, COVID-19 infection. Ask your healthcare provider to call the local or state health department.  Wear a facemask You should wear a facemask that covers your nose and mouth when you are in the same room with other people and when you visit a healthcare provider. People who live with or visit you should also wear a facemask while they are in the same room with you.  Separate yourself from other people in your home As much as possible, you should stay in a different room from other people in your home. Also, you should use a separate bathroom, if available.  Avoid sharing household items You should not share dishes,  drinking glasses, cups, eating utensils, towels, bedding, or other items with other people in your home. After using these items, you should wash them thoroughly with soap and water.  Cover your coughs and sneezes Cover your mouth and nose with a tissue when you cough or sneeze, or you can cough or sneeze into your sleeve. Throw used tissues in a lined trash can, and immediately wash your hands with soap and water for at least 20 seconds or use an alcohol-based hand rub.  Wash your [facility] your hands often and thoroughly with soap and water for at least 20 seconds. You can use an alcohol-based hand sanitizer if soap and water are not available and if your hands are not visibly dirty. Avoid touching your eyes, nose, and mouth with unwashed hands.   Prevention Steps for Caregivers and Household Members of Individuals Confirmed to have, or Being Evaluated for, COVID-19 Infection Being Cared for in the Home  If you live with, or provide care at home for, a person confirmed to have, or being evaluated for, COVID-19 infection please follow these guidelines to prevent infection:  Follow healthcare providers instructions Make sure that you understand and can help the patient follow any healthcare provider instructions for all care.  Provide for the patients basic needs You should help the patient with basic needs in the home and provide support for getting groceries, prescriptions, and other personal needs.  Monitor the patients symptoms If they are getting sicker, call his or her medical provider and tell them that the patient has, or is being evaluated for, COVID-19 infection. This will help the healthcare providers office  take steps to keep other people from getting infected. Ask the healthcare provider to call the local or state health department.  Limit the number of people who have contact with the patient  If possible, have only one caregiver for the patient.  Other  household members should stay in another home or place of residence. If this is not possible, they should stay  in another room, or be separated from the patient as much as possible. Use a separate bathroom, if available.  Restrict visitors who do not have an essential need to be in the home.  Keep older adults, very young children, and other sick people away from the patient Keep older adults, very young children, and those who have compromised immune systems or chronic health conditions away from the patient. This includes people with chronic heart, lung, or kidney conditions, diabetes, and cancer.  Ensure good ventilation Make sure that shared spaces in the home have good air flow, such as from an air conditioner or an opened window, weather permitting.  Wash your hands often  Wash your hands often and thoroughly with soap and water for at least 20 seconds. You can use an alcohol based hand sanitizer if soap and water are not available and if your hands are not visibly dirty.  Avoid touching your eyes, nose, and mouth with unwashed hands.  Use disposable paper towels to dry your hands. If not available, use dedicated cloth towels and replace them when they become wet.  Wear a facemask and gloves  Wear a disposable facemask at all times in the room and gloves when you touch or have contact with the patients blood, body fluids, and/or secretions or excretions, such as sweat, saliva, sputum, nasal mucus, vomit, urine, or feces.  Ensure the mask fits over your nose and mouth tightly, and do not touch it during use.  Throw out disposable facemasks and gloves after using them. Do not reuse.  Wash your hands immediately after removing your facemask and gloves.  If your personal clothing becomes contaminated, carefully remove clothing and launder. Wash your hands after handling contaminated clothing.  Place all used disposable facemasks, gloves, and other waste in a lined container before  disposing them with other household waste.  Remove gloves and wash your hands immediately after handling these items.  Do not share dishes, glasses, or other household items with the patient  Avoid sharing household items. You should not share dishes, drinking glasses, cups, eating utensils, towels, bedding, or other items with a patient who is confirmed to have, or being evaluated for, COVID-19 infection.  After the person uses these items, you should wash them thoroughly with soap and water.  Wash laundry thoroughly  Immediately remove and wash clothes or bedding that have blood, body fluids, and/or secretions or excretions, such as sweat, saliva, sputum, nasal mucus, vomit, urine, or feces, on them.  Wear gloves when handling laundry from the patient.  Read and follow directions on labels of laundry or clothing items and detergent. In general, wash and dry with the warmest temperatures recommended on the label.  Clean all areas the individual has used often  Clean all touchable surfaces, such as counters, tabletops, doorknobs, bathroom fixtures, toilets, phones, keyboards, tablets, and bedside tables, every day. Also, clean any surfaces that may have blood, body fluids, and/or secretions or excretions on them.  Wear gloves when cleaning surfaces the patient has come in contact with.  Use a diluted bleach solution (e.g., dilute bleach with 1 part  bleach and 10 parts water) or a household disinfectant with a label that says EPA-registered for coronaviruses. To make a bleach solution at home, add 1 tablespoon of bleach to 1 quart (4 cups) of water. For a larger supply, add  cup of bleach to 1 gallon (16 cups) of water.  Read labels of cleaning products and follow recommendations provided on product labels. Labels contain instructions for safe and effective use of the cleaning product including precautions you should take when applying the product, such as wearing gloves or eye protection  and making sure you have good ventilation during use of the product.  Remove gloves and wash hands immediately after cleaning.  Monitor yourself for signs and symptoms of illness Caregivers and household members are considered close contacts, should monitor their health, and will be asked to limit movement outside of the home to the extent possible. Follow the monitoring steps for close contacts listed on the symptom monitoring form.   ? If you have additional questions, contact your local health department or call the epidemiologist on call at [phone] (available 24/7). ? This guidance is subject to change. For the most up-to-date guidance from [facility], please refer to their website: YouBlogs.pl

## 2019-01-27 NOTE — Progress Notes (Signed)
Pt arrived to unit.  Transferred to  Bed, CHG bath, VS, CBG, oriented to room and unit.

## 2019-01-27 NOTE — Progress Notes (Signed)
Patient discharge AVS reviewed and all questions answered.  Patient belongings reviewed and returned.

## 2019-01-27 NOTE — ED Notes (Signed)
[facility] present to transport Pt to [place].

## 2019-01-27 NOTE — Care Management Obs Status (Signed)
[place] NOTIFICATION   Patient Details  Name: [name] MRN: [id number] Date of Birth: 03/13/1966   Medicare Observation Status Notification Given:  Yes(provided by the bedside RN- reviewed with pt over the phone)    Dawayne Patricia, RN 01/27/2019, 5:21 PM

## 2019-01-27 NOTE — ED Notes (Signed)
Date and time results received: 01/27/19 02:45 (use smartphrase ".now" to insert current time)  Test: covid Critical Value: positive  Name of Provider Notified: Dr Gadhia  Orders Received? Or Actions Taken?:see emr 

## 2019-02-05 DIAGNOSIS — G8929 Other chronic pain: Secondary | ICD-10-CM | POA: Insufficient documentation

## 2019-02-20 ENCOUNTER — Other Ambulatory Visit: Payer: Self-pay

## 2019-02-20 ENCOUNTER — Emergency Department (HOSPITAL_COMMUNITY): Payer: Medicare HMO

## 2019-02-20 ENCOUNTER — Encounter (HOSPITAL_COMMUNITY): Payer: Self-pay

## 2019-02-20 ENCOUNTER — Emergency Department (HOSPITAL_COMMUNITY)
Admission: EM | Admit: 2019-02-20 | Discharge: 2019-02-20 | Disposition: A | Discharge status: Home / Self Care | Payer: Medicare HMO | Attending: Emergency Medicine | Admitting: Emergency Medicine

## 2019-02-20 DIAGNOSIS — Z794 Long term (current) use of insulin: Secondary | ICD-10-CM | POA: Diagnosis not present

## 2019-02-20 DIAGNOSIS — S29012A Strain of muscle and tendon of back wall of thorax, initial encounter: Secondary | ICD-10-CM | POA: Diagnosis not present

## 2019-02-20 DIAGNOSIS — W1839XA Other fall on same level, initial encounter: Secondary | ICD-10-CM | POA: Diagnosis not present

## 2019-02-20 DIAGNOSIS — S40912A Unspecified superficial injury of left shoulder, initial encounter: Secondary | ICD-10-CM | POA: Diagnosis present

## 2019-02-20 DIAGNOSIS — Y9289 Other specified places as the place of occurrence of the external cause: Secondary | ICD-10-CM | POA: Diagnosis not present

## 2019-02-20 DIAGNOSIS — Z79899 Other long term (current) drug therapy: Secondary | ICD-10-CM | POA: Diagnosis not present

## 2019-02-20 DIAGNOSIS — Y93K1 Activity, walking an animal: Secondary | ICD-10-CM | POA: Diagnosis not present

## 2019-02-20 DIAGNOSIS — S46812A Strain of other muscles, fascia and tendons at shoulder and upper arm level, left arm, initial encounter: Secondary | ICD-10-CM

## 2019-02-20 DIAGNOSIS — M62838 Other muscle spasm: Secondary | ICD-10-CM | POA: Insufficient documentation

## 2019-02-20 DIAGNOSIS — E119 Type 2 diabetes mellitus without complications: Secondary | ICD-10-CM | POA: Insufficient documentation

## 2019-02-20 DIAGNOSIS — Y999 Unspecified external cause status: Secondary | ICD-10-CM | POA: Diagnosis not present

## 2019-02-20 MED ORDER — BACLOFEN 10 MG PO TABS: 10.0000 mg | ORAL_TABLET | Freq: Three times a day (TID) | ORAL | 0 refills | Status: DC

## 2019-02-20 MED ORDER — MELOXICAM 7.5 MG PO TABS: 7.5000 mg | ORAL_TABLET | Freq: Two times a day (BID) | ORAL | 0 refills | Status: AC | PRN

## 2019-02-20 NOTE — ED Triage Notes (Addendum)
Patient presents to the ED with complaint of left shoulder pain. Patient reports when she was walking her dog she got entangled in the leash and fell 2 days ago. Landed on left side. Patient denies hitting her head and no LOC. Patient now reports pain with range of motion but demonstrates active ROM during triage with conversation. NAD noted.

## 2019-02-20 NOTE — Discharge Instructions (Addendum)
Your x-rays do not show any broken bones or dislocations.  You may benefit from an anti-inflammatory such as Mobic, take 1 tablet twice a day for up to 7 days but no longer.  You may also stop taking methocarbamol which is a muscle relaxer on your medication list.  Instead try baclofen, 1 tablet 3 times a day as needed.  I would advise you to use warm compresses to help increase blood flow to the muscles and talk to your family doctor about physical therapy which may be needed to loosen up your shoulder.  Please use the sling for comfort however several times a day please take your arm out of the sling and try to move your shoulder around to get good range of motion.  Again you should talk to your doctor within 48 hours to set up a close follow-up to arrange physical therapy.  If you keep your arm in the sling you will develop a frozen shoulder and this is a chronic problem that is very difficult to fix  Please be aware that medications such as muscle relaxers can interact with other medications that you take.  If you take baclofen and it makes you feel sleepy or drowsy then you should not drive a car.  Also it may interact with other medications which may cause undesirable side effects.  If you start to feel poorly in any way please stop the medication immediately and call your doctor or return to the emergency department

## 2019-02-20 NOTE — ED Provider Notes (Signed)
[name] Provider Note   CSN: [id number] Arrival date & time: 02/20/19  0053     History   Chief Complaint Chief Complaint  Patient presents with  . Shoulder Pain    HPI [name] is a 53 y.o. female.     HPI  This patient is a 53 year old female, she is very pleasant, she has a history of diabetes, acid reflux, degenerative joint disease and a known history of thrombocytopenia likely related to her history of liver failure.  She presents to the hospital with a complaint of left shoulder and neck pain which has been progressive over the last 4 days since she had a fall.  She reports that while she was walking her dog the cord got wrapped around her legs and the leash tripped her causing her to fall backwards.  She denies loss of consciousness though she did have a mild bump on her head but is complaining more of left shoulder and left trapezius pain.  She had 2 more subsequent falls after that both brought on in the same mechanism while she was walking her dog after the dog's leash got wrapped around her legs and tripped her.  She denies direct impact on the left shoulder and has been able to get up each time.  She now has difficulty using the left arm because she states that any movement of the arm causes pain in the trapezius area and the shoulder.  There is no shortness of breath, no changes in vision, no headache, no difficulty with her legs.  She has been taking methocarbamol which she takes chronically for muscle spasms.  This has not been helping her pain.  Past Medical History:  Diagnosis Date  . Degenerative joint disease   . Diabetes mellitus without complication ([place])   . Gastroesophageal reflux   . Panic attack   . Sleep apnea   . Thrombocytopenia [facility])     Patient Active Problem List   Diagnosis Date Noted  . DKA (diabetic ketoacidoses) ([place]) 01/26/2019  . Diabetes mellitus without complication ([place])   . Sleep apnea   .  Gastroesophageal reflux   . Panic attack   . Thrombocytopenia ([place])     Past Surgical History:  Procedure Laterality Date  . CHOLECYSTECTOMY    . SHOULDER SURGERY     left   . tongue sx. titanium screw placed to hold tongue down    . TOTAL THYROIDECTOMY       OB History   No obstetric history on file.      Home Medications    Prior to Admission medications   Medication Sig Start Date End Date Taking? Authorizing Provider  albuterol (VENTOLIN HFA) 108 (90 Base) MCG/ACT inhaler Inhale 1-2 puffs into the lungs every 4 (four) hours as needed for wheezing or shortness of breath.  01/11/19   [provider]  baclofen (LIORESAL) 10 MG tablet Take 1 tablet (10 mg total) by mouth 3 (three) times daily. 02/20/19   Noemi Chapel, MD  empagliflozin (JARDIANCE) 25 MG TABS tablet Take 25 mg by mouth daily.  08/25/18   [provider]  insulin aspart (NOVOLOG FLEXPEN) 100 UNIT/ML FlexPen Inject 2-10 Units into the skin See admin instructions. Inject 2 units being the least and 10 units being the most via sliding scale.    [provider]  Insulin Glargine, 2 Unit Dial, (TOUJEO MAX SOLOSTAR) 300 UNIT/ML SOPN Inject 10 Units into the skin daily. 01/27/19   Elgergawy,  Silver Huguenin, MD  levothyroxine (SYNTHROID, LEVOTHROID) 50 MCG tablet Take 1 tablet by mouth daily. Take at night. 07/25/14   [provider]  meloxicam (MOBIC) 7.5 MG tablet Take 1 tablet (7.5 mg total) by mouth 2 (two) times daily as needed for up to 7 days for pain. 02/20/19 02/27/19  Noemi Chapel, MD  metFORMIN (GLUCOPHAGE) 1000 MG tablet Take 1,000 mg by mouth 2 (two) times daily with a meal.     [provider]  metoCLOPramide (REGLAN) 10 MG tablet Take 10 mg by mouth 4 (four) times daily.     [provider]  ondansetron (ZOFRAN-ODT) 4 MG disintegrating tablet Take 1 tablet by mouth 2 (two) times daily.  09/18/16   [provider]  sertraline (ZOLOFT) 100 MG tablet Take 150 mg by  mouth daily. 09/10/16   [provider]  traZODone (DESYREL) 100 MG tablet Take 1 tablet by mouth daily. 09/15/16   [provider]    Family History No family history on file.  Social History Social History   Tobacco Use  . Smoking status: Never Smoker  . Smokeless tobacco: Never Used  Substance Use Topics  . Alcohol use: No  . Drug use: No     Allergies   Bactrim [sulfamethoxazole-trimethoprim], Ciprofloxacin, Dilaudid [hydromorphone hcl], Keflex [cephalexin], and Tylenol [acetaminophen]   Review of Systems Review of Systems  Respiratory: Negative for cough and shortness of breath.   Cardiovascular: Negative for chest pain and leg swelling.  Musculoskeletal: Positive for arthralgias, myalgias, neck pain and neck stiffness. Negative for back pain and joint swelling.  Neurological: Negative for weakness and numbness.     Physical Exam Updated Vital Signs BP (!) 125/59 (BP Location: Left Arm)   Pulse 97   Temp 98 F (36.7 C) (Oral)   Resp 19   Ht 1.6 m (5' 3" )   Wt 54.4 kg   SpO2 96%   BMI 21.26 kg/m   Physical Exam Vitals signs and nursing note reviewed.  Constitutional:      General: She is not in acute distress.    Appearance: She is well-developed.  HENT:     Head: Normocephalic and atraumatic.     Mouth/Throat:     Pharynx: No oropharyngeal exudate.  Eyes:     General: No scleral icterus.       Right eye: No discharge.        Left eye: No discharge.     Conjunctiva/sclera: Conjunctivae normal.     Pupils: Pupils are equal, round, and reactive to light.  Neck:     Musculoskeletal: Normal range of motion and neck supple.     Thyroid: No thyromegaly.     Vascular: No JVD.  Cardiovascular:     Rate and Rhythm: Normal rate and regular rhythm.     Heart sounds: Normal heart sounds. No murmur. No friction rub. No gallop.   Pulmonary:     Effort: Pulmonary effort is normal. No respiratory distress.     Breath sounds: Normal breath  sounds. No wheezing or rales.  Abdominal:     General: Bowel sounds are normal. There is no distension.     Palpations: Abdomen is soft. There is no mass.     Tenderness: There is no abdominal tenderness.  Musculoskeletal: Normal range of motion.        General: No tenderness.     Comments: The patient has normal range of motion of the right arm and the bilateral legs.  She  is able to range of motion her left shoulder approximately 50%, she cannot abduct against resistance without pain, she has pain with internal and external rotation and pain with palpation around the shoulder girdle as well as into the trapezius muscle.  There is no tenderness over the cervical thoracic or lumbar spines  Lymphadenopathy:     Cervical: No cervical adenopathy.  Skin:    General: Skin is warm and dry.     Findings: No erythema or rash.  Neurological:     Mental Status: She is alert.     Coordination: Coordination normal.     Comments: Normal grip and sensation of the left upper extremity  Psychiatric:        Behavior: Behavior normal.      ED Treatments / Results  Labs (all labs ordered are listed, but only abnormal results are displayed) Labs Reviewed - No data to display  EKG None  Radiology Dg Shoulder Left  Result Date: 02/20/2019 CLINICAL DATA:  Trauma, fall EXAM: LEFT SHOULDER - 2+ VIEW COMPARISON:  June 02, 2018 FINDINGS: There is no evidence of fracture or dislocation. There is no evidence of arthropathy or other focal bone abnormality. Soft tissues are unremarkable. IMPRESSION: Negative. Electronically Signed   By: Prudencio Pair M.D.   On: 02/20/2019 02:04    Procedures Procedures (including critical care time)  Medications Ordered in ED Medications - No data to display   Initial Impression / Assessment and Plan / ED Course  I have reviewed the triage vital signs and the nursing notes.  Pertinent labs & imaging results that were available during my care of the patient were  reviewed by me and considered in my medical decision making (see chart for details).  Clinical Course as of Feb 19 210  Wed Feb 20, 2019  0207 I have personally viewed the x-rays of the left shoulder including the visualized ribs, spine and clavicle.  There does not appear to be any signs of acute fractures or dislocations.  There is degenerative change seen   [BM]    Clinical Course User Index [BM] Noemi Chapel, MD       The patient has suffered a recurrent injury causing increasing pain and spasm of the muscles around her neck and shoulder.  She will need an x-ray to make sure nothing is broken as she is older and likely has some brittle nature to the bones.  She has nutrients deficiencies, as a diabetic and with history of pancreatic insufficiency she is taking enzymes to help with digestion.  X-rays pending, sling for comfort, pain control will be determined once x-rays are imaged  Final Clinical Impressions(s) / ED Diagnoses   Final diagnoses:  Muscle spasm of left shoulder  Trapezius muscle strain, left, initial encounter    ED Discharge Orders         Ordered    meloxicam (MOBIC) 7.5 MG tablet  2 times daily PRN     02/20/19 0210    baclofen (LIORESAL) 10 MG tablet  3 times daily     02/20/19 0210           Noemi Chapel, MD 02/20/19 [phone]

## 2019-03-20 DIAGNOSIS — Z8709 Personal history of other diseases of the respiratory system: Secondary | ICD-10-CM | POA: Insufficient documentation

## 2019-03-30 DIAGNOSIS — F410 Panic disorder [episodic paroxysmal anxiety] without agoraphobia: Secondary | ICD-10-CM | POA: Diagnosis present

## 2019-04-02 DIAGNOSIS — R296 Repeated falls: Secondary | ICD-10-CM | POA: Insufficient documentation

## 2019-04-02 DIAGNOSIS — R930 Abnormal findings on diagnostic imaging of skull and head, not elsewhere classified: Secondary | ICD-10-CM | POA: Insufficient documentation

## 2019-04-02 DIAGNOSIS — R2990 Unspecified symptoms and signs involving the nervous system: Secondary | ICD-10-CM | POA: Insufficient documentation

## 2019-04-11 ENCOUNTER — Emergency Department (HOSPITAL_COMMUNITY)
Admission: EM | Admit: 2019-04-11 | Discharge: 2019-04-12 | Disposition: A | Discharge status: Home / Self Care | Payer: Medicare HMO | Attending: Emergency Medicine | Admitting: Emergency Medicine

## 2019-04-11 ENCOUNTER — Encounter (HOSPITAL_COMMUNITY): Payer: Self-pay | Admitting: Emergency Medicine

## 2019-04-11 ENCOUNTER — Other Ambulatory Visit: Payer: Self-pay

## 2019-04-11 DIAGNOSIS — E1165 Type 2 diabetes mellitus with hyperglycemia: Secondary | ICD-10-CM | POA: Insufficient documentation

## 2019-04-11 DIAGNOSIS — D696 Thrombocytopenia, unspecified: Secondary | ICD-10-CM | POA: Insufficient documentation

## 2019-04-11 DIAGNOSIS — J449 Chronic obstructive pulmonary disease, unspecified: Secondary | ICD-10-CM | POA: Diagnosis not present

## 2019-04-11 DIAGNOSIS — Z79899 Other long term (current) drug therapy: Secondary | ICD-10-CM | POA: Diagnosis not present

## 2019-04-11 DIAGNOSIS — Z794 Long term (current) use of insulin: Secondary | ICD-10-CM | POA: Diagnosis not present

## 2019-04-11 DIAGNOSIS — K7290 Hepatic failure, unspecified without coma: Secondary | ICD-10-CM

## 2019-04-11 DIAGNOSIS — K7030 Alcoholic cirrhosis of liver without ascites: Secondary | ICD-10-CM | POA: Diagnosis not present

## 2019-04-11 DIAGNOSIS — R109 Unspecified abdominal pain: Secondary | ICD-10-CM | POA: Diagnosis present

## 2019-04-11 DIAGNOSIS — R1084 Generalized abdominal pain: Secondary | ICD-10-CM | POA: Insufficient documentation

## 2019-04-11 DIAGNOSIS — K7210 Chronic hepatic failure without coma: Secondary | ICD-10-CM

## 2019-04-11 LAB — CBC
HCT: 28.7 % — ABNORMAL LOW (ref 36.0–46.0)
Hemoglobin: 9.1 g/dL — ABNORMAL LOW (ref 12.0–15.0)
MCH: 29.4 pg (ref 26.0–34.0)
MCHC: 31.7 g/dL (ref 30.0–36.0)
MCV: 92.9 fL (ref 80.0–100.0)
Platelets: 59 10*3/uL — ABNORMAL LOW (ref 150–400)
RBC: 3.09 MIL/uL — ABNORMAL LOW (ref 3.87–5.11)
RDW: 15.9 % — ABNORMAL HIGH (ref 11.5–15.5)
WBC: 3.2 10*3/uL — ABNORMAL LOW (ref 4.0–10.5)
nRBC: 0 % (ref 0.0–0.2)

## 2019-04-11 LAB — COMPREHENSIVE METABOLIC PANEL
ALT: 29 U/L (ref 0–44)
AST: 42 U/L — ABNORMAL HIGH (ref 15–41)
Albumin: 2 g/dL — ABNORMAL LOW (ref 3.5–5.0)
Alkaline Phosphatase: 111 U/L (ref 38–126)
Anion gap: 7 (ref 5–15)
BUN: 12 mg/dL (ref 6–20)
CO2: 26 mmol/L (ref 22–32)
Calcium: 8.3 mg/dL — ABNORMAL LOW (ref 8.9–10.3)
Chloride: 100 mmol/L (ref 98–111)
Creatinine, Ser: 0.47 mg/dL (ref 0.44–1.00)
GFR calc Af Amer: >60 mL/min (ref >60)
GFR calc non Af Amer: >60 mL/min (ref >60)
Glucose, Bld: 343 mg/dL — ABNORMAL HIGH (ref 70–99)
Potassium: 3.6 mmol/L (ref 3.5–5.1)
Sodium: 133 mmol/L — ABNORMAL LOW (ref 135–145)
Total Bilirubin: 2.7 mg/dL — ABNORMAL HIGH (ref 0.3–1.2)
Total Protein: 5.1 g/dL — ABNORMAL LOW (ref 6.5–8.1)

## 2019-04-11 LAB — LIPASE, BLOOD: Lipase: 24 U/L (ref 11–51)

## 2019-04-11 MED ORDER — DIPHENHYDRAMINE HCL 25 MG PO CAPS
25.0000 mg | ORAL_CAPSULE | Freq: Once | ORAL | Status: AC
  Administered 2019-04-12: 01:00:00 25 mg via ORAL
  Filled 2019-04-11: qty 1

## 2019-04-11 MED ORDER — SODIUM CHLORIDE 0.9% FLUSH: 3.0000 mL | Freq: Once | INTRAVENOUS | Status: DC

## 2019-04-11 MED ORDER — HYDROMORPHONE HCL 1 MG/ML IJ SOLN
1.0000 mg | Freq: Once | INTRAMUSCULAR | Status: AC
  Administered 2019-04-12: 01:00:00 1 mg via INTRAMUSCULAR
  Filled 2019-04-11: qty 1

## 2019-04-11 NOTE — ED Triage Notes (Signed)
Patient complains of abdominal pain with obvious distention noted.

## 2019-04-11 NOTE — ED Provider Notes (Signed)
  Face-to-face evaluation   History: She presents for evaluation of cramping in her abdomen with some swelling.  She denies vomiting, or change in bowel habits.  She is using oxycodone, 1 or 2 a day, and get a new prescription, today.  She has an upcoming appointment with her gastroenterologist who is in [place], [place].  Physical exam: Alert, female who appears older than stated age.  No respiratory distress.  Abdomen is soft and distended.  Medical screening examination/treatment/procedure(s) were conducted as a shared visit with non-physician practitioner(s) and myself.  I personally evaluated the patient during the encounter    Daleen Bo, MD 04/15/19 [phone]

## 2019-04-11 NOTE — ED Provider Notes (Signed)
[facility] EMERGENCY DEPARTMENT Provider Note   CSN: [id number] Arrival date & time: 04/11/19  1956     History Chief Complaint  Patient presents with  . Abdominal Pain    [name] is a 54 y.o. female.  HPI      [name] is a 54 y.o. female with PMH of alcoholic cirrhosis, portal hypertension, chronic pancreatitis, chronic thrombocytopenia, COPD, diabetes and left-sided hemiparesis and recently accepted for [facility] care per patient.  She comes to the Emergency Department with worsening of her chronic abdominal pain and requesting pain control.  She was admitted to [facility] on 04/01/19 after presenting to the hospital with generalized weakness and multiple falls.  She was discharged on 04/06/19.  Tonight, she complains of waxing and waning abdominal pain she states is typical for her.  She has recently started taking oxycodone 5 mg 4 times daily for her chronic pain.  She states this is not controlling her abdominal pain.  She complains of diffuse pain to her abdomen with some swelling that is also chronic.  She reports some nausea but denies vomiting, diarrhea, hematochezia, hematemesis or melena, fever, chills, chest pain and shortness of breath.  She also reports having some increased swelling of both lower legs, but takes spirolactone and Lasix daily. She does have an upcoming GI appt in [facility].     Pt is DNR   Past Medical History:  Diagnosis Date  . Degenerative joint disease   . Diabetes mellitus without complication ([place])   . Gastroesophageal reflux   . Panic attack   . Sleep apnea   . Thrombocytopenia [facility])     Patient Active Problem List   Diagnosis Date Noted  . Abnormal head CT 04/02/2019  . Multiple falls 04/02/2019  . Stroke-like symptoms 04/02/2019  . Panic attack 03/30/2019  . History of COPD 03/20/2019  . Chronic abdominal pain 02/05/2019  . DKA (diabetic ketoacidoses) ([place]) 01/26/2019  . Sleep apnea   . Gastroesophageal  reflux   . Hepatitis A 01/13/2019  . 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 COVID-19 01/12/2019  . Glycosuria 01/12/2019  . Hyperbilirubinemia 01/12/2019  . Hypoalbuminemia 01/12/2019  . Normocytic anemia, not due to blood loss 01/12/2019  . Uncontrolled type 2 diabetes mellitus with hyperglycemia, with long-term current use of insulin ([place]) 01/12/2019  . Low TSH level 01/12/2019  . Chest pain 12/16/2018  . Left-sided weakness 12/16/2018  . Weight loss 12/16/2018  . Schizoaffective disorder ([place]) 03/10/2018  . Essential hypertension 03/09/2018  . H/O partial thyroidectomy 10/25/2017  . Thyroid nodule 10/25/2017  . Cricopharyngeal hypertrophy 09/26/2017  . Pharyngoesophageal dysphagia 09/26/2017  . Reaction to severe stress 09/11/2017  . Newly diagnosed infection due to multidrug resistant organism 05/14/2017  . Left hemiparesis ([place]) 04/18/2017  . Disorder of shoulder 03/03/2017  . Lactic acidosis 09/27/2016  . Pancytopenia ([place]) 09/27/2016  . Acquired hypothyroidism 09/27/2016  . Pyelonephritis 09/27/2016  . Microalbuminuria due to type 2 diabetes mellitus ([place]) 04/20/2016  . Diabetic peripheral neuropathy associated with type 2 diabetes mellitus ([place]) 12/03/2015  . Hepatic steatosis 12/03/2015  . Diabetes mellitus without complication ([place]) 16/60/6301  . Moderate episode of recurrent major depressive disorder ([place]) 03/20/2015  . Complicated UTI (urinary tract infection) 12/25/2014  . Vaginitis and vulvovaginitis 11/06/2014  . Blurry vision, bilateral 10/17/2014  . Health care maintenance 10/17/2014  . Nausea 10/17/2014  . Multinodular thyroid 07/01/2014  . Cirrhosis of liver ([place]) 03/05/2014  . Postoperative hypothyroidism 03/05/2014  .  Type 2 diabetes mellitus ([place]) 03/05/2014  . Thrombocytopenia ([place]) 01/17/2014  . Alcoholic cirrhosis of liver without ascites ([place]) 01/17/2014  . Back problem 05/28/2013  . Low back pain 04/12/2013  . Borderline  personality disorder ([place]) 08/25/2012  . Depression 08/25/2012  . Hypercholesterolemia 08/25/2012  . Osteoarthrosis 08/25/2012  . Gastro-esophageal reflux disease without esophagitis 08/25/2012  . Obstructive sleep apnea 08/25/2012    Past Surgical History:  Procedure Laterality Date  . CHOLECYSTECTOMY    . SHOULDER SURGERY     left   . tongue sx. titanium screw placed to hold tongue down    . TOTAL THYROIDECTOMY       OB History   No obstetric history on file.     History reviewed. No pertinent family history.  Social History   Tobacco Use  . Smoking status: Never Smoker  . Smokeless tobacco: Never Used  Substance Use Topics  . Alcohol use: No  . Drug use: No    Home Medications Prior to Admission medications   Medication Sig Start Date End Date Taking? Authorizing Provider  albuterol (VENTOLIN HFA) 108 (90 Base) MCG/ACT inhaler Inhale 1-2 puffs into the lungs every 4 (four) hours as needed for wheezing or shortness of breath.  01/11/19  Yes [provider]  ARIPiprazole (ABILIFY) 2 MG tablet Take 2 mg by mouth daily. 03/27/19  Yes [provider]  baclofen (LIORESAL) 10 MG tablet Take 1 tablet (10 mg total) by mouth 3 (three) times daily. 02/20/19  Yes Noemi Chapel, MD  CREON [phone] units CPEP Take 1 capsule by mouth 3 (three) times daily. 02/11/19  Yes [provider]  empagliflozin (JARDIANCE) 25 MG TABS tablet Take 25 mg by mouth daily.  08/25/18  Yes [provider]  furosemide (LASIX) 20 MG tablet Take 20 mg by mouth 2 (two) times daily as needed. 03/11/19  Yes [provider]  insulin aspart (NOVOLOG FLEXPEN) 100 UNIT/ML FlexPen Inject 2-10 Units into the skin See admin instructions. Inject 2 units being the least and 10 units being the most via sliding scale.   Yes [provider]  Insulin Glargine, 2 Unit Dial, (TOUJEO MAX SOLOSTAR) 300 UNIT/ML SOPN Inject 10 Units into the skin daily. 01/27/19  Yes Elgergawy,  Silver Huguenin, MD  levothyroxine (SYNTHROID, LEVOTHROID) 50 MCG tablet Take 1 tablet by mouth daily. Take at night. 07/25/14  Yes [provider]  lisinopril (ZESTRIL) 10 MG tablet Take 10 mg by mouth daily. 03/29/19  Yes [provider]  metFORMIN (GLUCOPHAGE) 1000 MG tablet Take 1,000 mg by mouth 2 (two) times daily with a meal.    Yes [provider]  metoCLOPramide (REGLAN) 10 MG tablet Take 10 mg by mouth 4 (four) times daily.    Yes [provider]  mirtazapine (REMERON) 7.5 MG tablet Take 7.5 mg by mouth at bedtime. 03/27/19  Yes [provider]  ondansetron (ZOFRAN-ODT) 4 MG disintegrating tablet Take 1 tablet by mouth 2 (two) times daily.  09/18/16  Yes [provider]  oxyCODONE (OXY IR/ROXICODONE) 5 MG immediate release tablet Take 5 mg by mouth 4 (four) times daily as needed. 04/10/19  Yes [provider]  pantoprazole (PROTONIX) 40 MG tablet Take 40 mg by mouth daily. 03/21/19  Yes [provider]  sertraline (ZOLOFT) 100 MG tablet Take 150 mg by mouth daily. 09/10/16  Yes [provider]  spironolactone (ALDACTONE) 50 MG tablet Take 50 mg by mouth daily. 03/27/19  Yes [provider]  tamsulosin (FLOMAX) 0.4 MG CAPS capsule Take 0.4 mg by mouth daily. 03/27/19  Yes [provider]  traZODone (DESYREL) 100 MG tablet Take 1 tablet by mouth daily. 09/15/16  Yes [provider]    Allergies    Acetaminophen, Cephalexin, Ciprofloxacin, Hydromorphone, Sulfamethoxazole-trimethoprim, Dilaudid [hydromorphone hcl], Hydromorphone hcl, Sulfamethoxazole, and Trimethoprim  Review of Systems   Review of Systems  Constitutional: Negative for chills and fever.  HENT: Negative for trouble swallowing.   Respiratory: Negative for cough, chest tightness and shortness of breath.   Cardiovascular: Positive for leg swelling. Negative for chest pain.  Gastrointestinal: Positive for abdominal pain and nausea. Negative  for diarrhea, rectal pain and vomiting.  Genitourinary: Negative for difficulty urinating and dysuria.  Musculoskeletal: Negative for arthralgias and joint swelling.  Skin: Negative for color change and wound.  Neurological: Negative for dizziness, syncope, weakness, numbness and headaches.  Psychiatric/Behavioral: Negative for confusion.    Physical Exam Updated Vital Signs BP (!) 146/74 (BP Location: Right Arm)   Pulse (!) 107   Temp 98.1 F (36.7 C) (Oral)   Resp 16   Ht 5' 3"  (1.6 m)   Wt 62.1 kg   SpO2 100%   BMI 24.27 kg/m   Physical Exam Vitals and nursing note reviewed.  Constitutional:      General: She is not in acute distress.    Appearance: She is not ill-appearing or toxic-appearing.  HENT:     Mouth/Throat:     Mouth: Mucous membranes are moist.  Cardiovascular:     Rate and Rhythm: Normal rate and regular rhythm.     Pulses: Normal pulses.  Pulmonary:     Effort: Pulmonary effort is normal. No respiratory distress.     Breath sounds: Normal breath sounds. No wheezing or rales.  Chest:     Chest wall: No tenderness.  Abdominal:     General: There is distension.     Palpations: Abdomen is soft. There is no mass.     Tenderness: There is no abdominal tenderness. There is no guarding.     Comments: Mild distention noted of the abdomen without palpable fluid wave.  Abdomen is soft  Musculoskeletal:        General: Normal range of motion.     Cervical back: Normal range of motion. No tenderness.     Right lower leg: Edema present.     Left lower leg: Edema present.     Comments: 1+ pitting edema of the bilateral lower extremities.  Skin:    General: Skin is warm.     Capillary Refill: Capillary refill takes less than 2 seconds.     Findings: No erythema.  Neurological:     General: No focal deficit present.     Mental Status: She is alert.     Sensory: No sensory deficit.     Motor: No weakness.     ED Results / Procedures / Treatments    Labs (all labs ordered are listed, but only abnormal results are displayed) Labs Reviewed  COMPREHENSIVE METABOLIC PANEL - Abnormal; Notable for the following components:      Result Value   Sodium 133 (*)    Glucose, Bld 343 (*)    Calcium 8.3 (*)    Total Protein 5.1 (*)    Albumin 2.0 (*)    AST 42 (*)    Total Bilirubin 2.7 (*)    All other components within normal limits  CBC - Abnormal; Notable for the following components:  WBC 3.2 (*)    RBC 3.09 (*)    Hemoglobin 9.1 (*)    HCT 28.7 (*)    RDW 15.9 (*)    Platelets 59 (*)    All other components within normal limits  LIPASE, BLOOD  URINALYSIS, ROUTINE W REFLEX MICROSCOPIC    EKG None  Radiology No results found.  Procedures Procedures (including critical care time)  Medications Ordered in ED Medications  sodium chloride flush (NS) 0.9 % injection 3 mL (3 mLs Intravenous Not Given 04/11/19 2209)  HYDROmorphone (DILAUDID) injection 1 mg (has no administration in time range)  diphenhydrAMINE (BENADRYL) capsule 25 mg (has no administration in time range)    ED Course  I have reviewed the triage vital signs and the nursing notes.  Pertinent labs & imaging results that were available during my care of the patient were reviewed by me and considered in my medical decision making (see chart for details).    MDM Rules/Calculators/A&P                      Patient with history of end-stage liver disease, chronic cryptitis, diabetes and left sided hemiparesis.  She has recently been accepted into hospice care although they have not been out to the home yet.  She is here this evening requesting pain control for chronic abdominal pain.  She is well-appearing and nontoxic.  She does have oxycodone that she takes once or twice daily but has been written for every 4 hours.  She denies fever, chills, vomiting, chest pain or shortness of breath.  Overall, she is well-appearing, nontoxic, no concerning symptoms for surgical  abdomen or indication for emergent paracentesis. Her abdomen is mildly distended but without fluid wave.  Work-up this evening is reassuring.  She has an upcoming appointment with her gastroenterologist in [facility] where her primary care provider is also located.  Patient was also seen by Dr. Eulis Foster and care plan was discussed.  her pain was addressed here, and discussed taking her oxycodone as directed for better pain control.    Final Clinical Impression(s) / ED Diagnoses Final diagnoses:  Generalized abdominal pain  End stage liver disease [facility])    Rx / DC Orders ED Discharge Orders    None       Kem Parkinson, PA-C 04/12/19 0053    Daleen Bo, MD 04/15/19 [phone]

## 2019-04-12 DIAGNOSIS — R1084 Generalized abdominal pain: Secondary | ICD-10-CM | POA: Diagnosis not present

## 2019-04-12 NOTE — Discharge Instructions (Addendum)
Continue taking your oxycodone for pain as directed by your doctor.  Be sure to follow-up with your gastroenterologist.  Return to the emergency department for any worsening symptoms including increasing abdominal pain, vomiting, fever or shortness of breath

## 2019-05-28 ENCOUNTER — Ambulatory Visit: Payer: Medicare HMO | Admitting: Diagnostic Neuroimaging

## 2019-08-18 DIAGNOSIS — K7682 Hepatic encephalopathy: Secondary | ICD-10-CM | POA: Diagnosis present

## 2019-08-18 DIAGNOSIS — K7290 Hepatic failure, unspecified without coma: Secondary | ICD-10-CM | POA: Diagnosis present

## 2019-08-25 DIAGNOSIS — B009 Herpesviral infection, unspecified: Secondary | ICD-10-CM | POA: Insufficient documentation

## 2019-09-27 ENCOUNTER — Encounter (HOSPITAL_COMMUNITY): Payer: Self-pay

## 2019-09-27 ENCOUNTER — Emergency Department (HOSPITAL_COMMUNITY): Payer: Medicare HMO

## 2019-09-27 ENCOUNTER — Other Ambulatory Visit: Payer: Self-pay

## 2019-09-27 ENCOUNTER — Emergency Department (HOSPITAL_COMMUNITY)
Admission: EM | Admit: 2019-09-27 | Discharge: 2019-09-28 | Disposition: A | Discharge status: Home / Self Care | Payer: Medicare HMO | Attending: Emergency Medicine | Admitting: Emergency Medicine

## 2019-09-27 DIAGNOSIS — R109 Unspecified abdominal pain: Secondary | ICD-10-CM | POA: Insufficient documentation

## 2019-09-27 DIAGNOSIS — E1165 Type 2 diabetes mellitus with hyperglycemia: Secondary | ICD-10-CM | POA: Diagnosis not present

## 2019-09-27 DIAGNOSIS — R739 Hyperglycemia, unspecified: Secondary | ICD-10-CM

## 2019-09-27 DIAGNOSIS — G8929 Other chronic pain: Secondary | ICD-10-CM | POA: Insufficient documentation

## 2019-09-27 HISTORY — DX: Acute pancreatitis without necrosis or infection, unspecified: K85.90

## 2019-09-27 LAB — I-STAT BETA HCG BLOOD, ED (MC, WL, AP ONLY): I-stat hCG, quantitative: <5 m[IU]/mL (ref <5)

## 2019-09-27 LAB — URINALYSIS, ROUTINE W REFLEX MICROSCOPIC
Bacteria, UA: NONE SEEN
Bilirubin Urine: NEGATIVE
Glucose, UA: >=500 mg/dL — AB
Ketones, ur: NEGATIVE mg/dL
Leukocytes,Ua: NEGATIVE
Nitrite: NEGATIVE
Protein, ur: NEGATIVE mg/dL
Specific Gravity, Urine: 1.023 (ref 1.005–1.030)
pH: 8 (ref 5.0–8.0)

## 2019-09-27 LAB — BASIC METABOLIC PANEL
Anion gap: 11 (ref 5–15)
BUN: 10 mg/dL (ref 6–20)
CO2: 27 mmol/L (ref 22–32)
Calcium: 9.6 mg/dL (ref 8.9–10.3)
Chloride: 87 mmol/L — ABNORMAL LOW (ref 98–111)
Creatinine, Ser: 0.84 mg/dL (ref 0.44–1.00)
GFR calc Af Amer: >60 mL/min (ref >60)
GFR calc non Af Amer: >60 mL/min (ref >60)
Glucose, Bld: 828 mg/dL (ref 70–99)
Potassium: 4.9 mmol/L (ref 3.5–5.1)
Sodium: 125 mmol/L — ABNORMAL LOW (ref 135–145)

## 2019-09-27 LAB — CBC
HCT: 30.3 % — ABNORMAL LOW (ref 36.0–46.0)
Hemoglobin: 9.5 g/dL — ABNORMAL LOW (ref 12.0–15.0)
MCH: 31.7 pg (ref 26.0–34.0)
MCHC: 31.4 g/dL (ref 30.0–36.0)
MCV: 101 fL — ABNORMAL HIGH (ref 80.0–100.0)
Platelets: 54 10*3/uL — ABNORMAL LOW (ref 150–400)
RBC: 3 MIL/uL — ABNORMAL LOW (ref 3.87–5.11)
RDW: 16.6 % — ABNORMAL HIGH (ref 11.5–15.5)
WBC: 3.5 10*3/uL — ABNORMAL LOW (ref 4.0–10.5)
nRBC: 0 % (ref 0.0–0.2)

## 2019-09-27 LAB — HEPATIC FUNCTION PANEL
ALT: 19 U/L (ref 0–44)
AST: 31 U/L (ref 15–41)
Albumin: 2.8 g/dL — ABNORMAL LOW (ref 3.5–5.0)
Alkaline Phosphatase: 122 U/L (ref 38–126)
Bilirubin, Direct: 1.1 mg/dL — ABNORMAL HIGH (ref 0.0–0.2)
Indirect Bilirubin: 1.9 mg/dL — ABNORMAL HIGH (ref 0.3–0.9)
Total Bilirubin: 3 mg/dL — ABNORMAL HIGH (ref 0.3–1.2)
Total Protein: 7.9 g/dL (ref 6.5–8.1)

## 2019-09-27 LAB — CBG MONITORING, ED
Glucose-Capillary: 262 mg/dL — ABNORMAL HIGH (ref 70–99)
Glucose-Capillary: 350 mg/dL — ABNORMAL HIGH (ref 70–99)
Glucose-Capillary: 415 mg/dL — ABNORMAL HIGH (ref 70–99)
Glucose-Capillary: 486 mg/dL — ABNORMAL HIGH (ref 70–99)
Glucose-Capillary: >600 mg/dL (ref 70–99)

## 2019-09-27 LAB — PROTIME-INR
INR: 1.4 — ABNORMAL HIGH (ref 0.8–1.2)
Prothrombin Time: 16.7 seconds — ABNORMAL HIGH (ref 11.4–15.2)

## 2019-09-27 LAB — LACTIC ACID, PLASMA: Lactic Acid, Venous: 2.8 mmol/L (ref 0.5–1.9)

## 2019-09-27 LAB — LIPASE, BLOOD: Lipase: 52 U/L — ABNORMAL HIGH (ref 11–51)

## 2019-09-27 MED ORDER — ONDANSETRON HCL 4 MG/2ML IJ SOLN
4.0000 mg | Freq: Once | INTRAMUSCULAR | Status: AC
  Administered 2019-09-27: 4 mg via INTRAVENOUS
  Filled 2019-09-27: qty 2

## 2019-09-27 MED ORDER — DEXTROSE-NACL 5-0.45 % IV SOLN: INTRAVENOUS | Status: DC

## 2019-09-27 MED ORDER — SODIUM CHLORIDE 0.9 % IV SOLN: INTRAVENOUS | Status: DC

## 2019-09-27 MED ORDER — IOHEXOL 300 MG/ML  SOLN
100.0000 mL | Freq: Once | INTRAMUSCULAR | Status: AC | PRN
  Administered 2019-09-27: 100 mL via INTRAVENOUS

## 2019-09-27 MED ORDER — INSULIN REGULAR(HUMAN) IN NACL 100-0.9 UT/100ML-% IV SOLN
INTRAVENOUS | Status: DC
  Administered 2019-09-27: 9.5 [IU]/h via INTRAVENOUS
  Filled 2019-09-27: qty 100

## 2019-09-27 MED ORDER — SODIUM CHLORIDE 0.9 % IV BOLUS
1000.0000 mL | Freq: Once | INTRAVENOUS | Status: AC
  Administered 2019-09-27: 1000 mL via INTRAVENOUS

## 2019-09-27 MED ORDER — DEXTROSE 50 % IV SOLN: 0.0000 mL | INTRAVENOUS | Status: DC | PRN

## 2019-09-27 NOTE — ED Triage Notes (Signed)
Pt to er, pt states that she has a hx of dm, states that she took her sugar this am and it was high, states that her meter stops reading after 600, states that she has been over 1000 in the past, states that she feels fatigued.

## 2019-09-27 NOTE — ED Provider Notes (Addendum)
[facility] Emergency Department Provider Note MRN:  [id number]  Arrival date & time: 09/27/19     Chief Complaint   Blood Sugar Problem   History of Present Illness   [name] is a 54 y.o. year-old female with a history of diabetes, pancreatitis presenting to the ED with chief complaint of blood sugar prior.  Patient has been feeling general malaise and fatigue for the past 2 or 3 days, which she attributes to her high blood sugars.  Denies fever, no cough, no burning with urination, no chest pain or shortness of breath.  Chronic abdominal pain which she attributes to her pancreas and liver.  Has lost 30 pounds in the past several months unintentionally.  Review of Systems  A complete 10 system review of systems was obtained and all systems are negative except as noted in the HPI and PMH.   Patient's Health History    Past Medical History:  Diagnosis Date  . Degenerative joint disease   . Diabetes mellitus without complication ([place])   . Gastroesophageal reflux   . Pancreatitis   . Panic attack   . Sleep apnea   . Thrombocytopenia ([place])     Past Surgical History:  Procedure Laterality Date  . CHOLECYSTECTOMY    . SHOULDER SURGERY     left   . tongue sx. titanium screw placed to hold tongue down    . TOTAL THYROIDECTOMY      History reviewed. No pertinent family history.  Social History   Socioeconomic History  . Marital status: Single    Spouse name: Not on file  . Number of children: Not on file  . Years of education: Not on file  . Highest education level: Not on file  Occupational History  . Not on file  Tobacco Use  . Smoking status: Never Smoker  . Smokeless tobacco: Never Used  Vaping Use  . Vaping Use: Never used  Substance and Sexual Activity  . Alcohol use: No  . Drug use: No  . Sexual activity: Not Currently  Other Topics Concern  . Not on file  Social History Narrative  . Not on file   Social Determinants of  Health   Financial Resource Strain:   . Difficulty of Paying Living Expenses:   Food Insecurity:   . Worried About Charity fundraiser in the Last Year:   . Arboriculturist in the Last Year:   Transportation Needs:   . Film/video editor (Medical):   Marland Kitchen Lack of Transportation (Non-Medical):   Physical Activity:   . Days of Exercise per Week:   . Minutes of Exercise per Session:   Stress:   . Feeling of Stress :   Social Connections:   . Frequency of Communication with Friends and Family:   . Frequency of Social Gatherings with Friends and Family:   . Attends Religious Services:   . Active Member of Clubs or Organizations:   . Attends Archivist Meetings:   Marland Kitchen Marital Status:   Intimate Partner Violence:   . Fear of Current or Ex-Partner:   . Emotionally Abused:   Marland Kitchen Physically Abused:   . Sexually Abused:      Physical Exam   Vitals:   09/27/19 2300 09/27/19 2330  BP: 130/69 131/61  Pulse: (!) 108 (!) 107  Resp: 17 18  Temp:    SpO2: 96% 94%    CONSTITUTIONAL: Chronically ill-appearing, NAD NEURO:  Alert and oriented x  3, no focal deficits EYES:  eyes equal and reactive ENT/NECK:  no LAD, no JVD CARDIO: Tachycardic rate, well-perfused, normal S1 and S2 PULM:  CTAB no wheezing or rhonchi GI/GU:  normal bowel sounds, non-distended, diffuse abdominal tenderness MSK/SPINE:  No gross deformities, no edema SKIN:  no rash, atraumatic PSYCH:  Appropriate speech and behavior  *Additional and/or pertinent findings included in MDM below  Diagnostic and Interventional Summary    EKG Interpretation  Date/Time:  Friday September 27 2019 21:26:42 EDT Ventricular Rate:  99 PR Interval:    QRS Duration: 96 QT Interval:  420 QTC Calculation: 539 R Axis:   79 Text Interpretation: Sinus rhythm Probable left ventricular hypertrophy Prolonged QT interval Baseline wander in lead(s) V2 V6 Confirmed by Gerlene Fee [phone]) on 09/27/2019 10:20:02 PM      Labs Reviewed    BASIC METABOLIC PANEL - Abnormal; Notable for the following components:      Result Value   Sodium 125 (*)    Chloride 87 (*)    Glucose, Bld 828 (*)    All other components within normal limits  CBC - Abnormal; Notable for the following components:   WBC 3.5 (*)    RBC 3.00 (*)    Hemoglobin 9.5 (*)    HCT 30.3 (*)    MCV 101.0 (*)    RDW 16.6 (*)    Platelets 54 (*)    All other components within normal limits  URINALYSIS, ROUTINE W REFLEX MICROSCOPIC - Abnormal; Notable for the following components:   Color, Urine STRAW (*)    Glucose, UA >=500 (*)    Hgb urine dipstick MODERATE (*)    All other components within normal limits  LIPASE, BLOOD - Abnormal; Notable for the following components:   Lipase 52 (*)    All other components within normal limits  HEPATIC FUNCTION PANEL - Abnormal; Notable for the following components:   Albumin 2.8 (*)    Total Bilirubin 3.0 (*)    Bilirubin, Direct 1.1 (*)    Indirect Bilirubin 1.9 (*)    All other components within normal limits  LACTIC ACID, PLASMA - Abnormal; Notable for the following components:   Lactic Acid, Venous 2.8 (*)    All other components within normal limits  PROTIME-INR - Abnormal; Notable for the following components:   Prothrombin Time 16.7 (*)    INR 1.4 (*)    All other components within normal limits  CBG MONITORING, ED - Abnormal; Notable for the following components:   Glucose-Capillary >600 (*)    All other components within normal limits  CBG MONITORING, ED - Abnormal; Notable for the following components:   Glucose-Capillary 486 (*)    All other components within normal limits  CBG MONITORING, ED - Abnormal; Notable for the following components:   Glucose-Capillary 415 (*)    All other components within normal limits  CBG MONITORING, ED - Abnormal; Notable for the following components:   Glucose-Capillary 350 (*)    All other components within normal limits  CBG MONITORING, ED - Abnormal; Notable  for the following components:   Glucose-Capillary 262 (*)    All other components within normal limits  CULTURE, BLOOD (SINGLE)  I-STAT BETA HCG BLOOD, ED ([facility], [facility], [facility] ONLY)    CT ABDOMEN PELVIS W CONTRAST  Final Result    DG Chest Port 1 View  Final Result      Medications  0.9 %  sodium chloride infusion ( Intravenous New Bag/Given 09/27/19 2157)  dextrose 50 % solution 0-50 mL (has no administration in time range)  sodium chloride 0.9 % bolus 1,000 mL (0 mLs Intravenous Stopped 09/27/19 2156)  iohexol (OMNIPAQUE) 300 MG/ML solution 100 mL (100 mLs Intravenous Contrast Given 09/27/19 2246)  ondansetron (ZOFRAN) injection 4 mg (4 mg Intravenous Given 09/27/19 2142)     Procedures  /  Critical Care .Critical Care Performed by: Maudie Flakes, MD Authorized by: Maudie Flakes, MD   Critical care provider statement:    Critical care time (minutes):  35   Critical care was necessary to treat or prevent imminent or life-threatening deterioration of the following conditions:  Metabolic crisis   Critical care was time spent personally by me on the following activities:  Discussions with consultants, evaluation of patient's response to treatment, examination of patient, ordering and performing treatments and interventions, ordering and review of laboratory studies, ordering and review of radiographic studies, pulse oximetry, re-evaluation of patient's condition, obtaining history from patient or surrogate and review of old charts    ED Course and Medical Decision Making  I have reviewed the triage vital signs, the nursing notes, and pertinent available records from the EMR.  Listed above are laboratory and imaging tests that I personally ordered, reviewed, and interpreted and then considered in my medical decision making (see below for details).      Hyperglycemia up to 800-900, hemodynamically stable, abdomen is tender to palpation diffusely, she has a history of pancreatitis, she has had  unintentional weight loss recently.  Will CT to evaluate for acute pancreatitis and/or malignancy.  Work-up is overall reassuring, blood sugar is correcting well, now in the 200s.  Will stop insulin drip and continue to monitor blood sugar for 1 or 2 more hours while providing further IV fluids.  Patient would like to go home and given the lack of any signs of DKA or infection this seems appropriate.  Will follow up with PCP.  Signed out to oncoming provider at shift change.  Barth Kirks. Sedonia Small, [place] [email or web address]  Final Clinical Impressions(s) / ED Diagnoses     ICD-10-CM   1. Hyperglycemia  R73.9     ED Discharge Orders    None       Discharge Instructions Discussed with and Provided to Patient:     Discharge Instructions     You were evaluated in the Emergency Department and after careful evaluation, we did not find any emergent condition requiring admission or further testing in the hospital.  Your exam/testing today was overall reassuring.  Please return to the Emergency Department if you experience any worsening of your condition.  We encourage you to follow up with a primary care provider.  Thank you for allowing [place] to be a part of your care.        Maudie Flakes, MD 09/27/19 9833    Maudie Flakes, MD 10/17/19 1018

## 2019-09-27 NOTE — Discharge Instructions (Addendum)
You were evaluated in the Emergency Department and after careful evaluation, we did not find any emergent condition requiring admission or further testing in the hospital. ° °Your exam/testing today was overall reassuring. ° °Please return to the Emergency Department if you experience any worsening of your condition.  We encourage you to follow up with a primary care provider.  Thank you for allowing us to be a part of your care. ° °

## 2019-09-27 NOTE — ED Notes (Addendum)
Date and time results received: 09/27/19 19:58 Test: Glucose Critical Value: 828  Name of Provider Notified:  MDBero  Physician notified

## 2019-09-27 NOTE — ED Notes (Signed)
Date and time results received: 09/27/19 11:03 PM  Test: Lactic Acid Critical Value: 2.8  Name of Provider Notified: Viona Gilmore, MD  Orders Received? Or Actions Taken?: Actions Taken: n/a

## 2019-09-28 ENCOUNTER — Other Ambulatory Visit: Payer: Self-pay

## 2019-09-28 LAB — CBG MONITORING, ED: Glucose-Capillary: 253 mg/dL — ABNORMAL HIGH (ref 70–99)

## 2019-09-29 ENCOUNTER — Encounter (HOSPITAL_COMMUNITY): Payer: Self-pay | Admitting: Emergency Medicine

## 2019-09-29 ENCOUNTER — Emergency Department (HOSPITAL_COMMUNITY)
Admission: EM | Admit: 2019-09-29 | Discharge: 2019-09-29 | Disposition: A | Discharge status: Home / Self Care | Payer: Medicare HMO | Source: Home / Self Care | Attending: Emergency Medicine | Admitting: Emergency Medicine

## 2019-09-29 ENCOUNTER — Emergency Department (HOSPITAL_COMMUNITY): Payer: Medicare HMO

## 2019-09-29 DIAGNOSIS — R739 Hyperglycemia, unspecified: Secondary | ICD-10-CM

## 2019-09-29 LAB — TROPONIN I (HIGH SENSITIVITY)
Troponin I (High Sensitivity): 5 ng/L (ref <18)
Troponin I (High Sensitivity): 5 ng/L (ref <18)

## 2019-09-29 LAB — BASIC METABOLIC PANEL
Anion gap: 10 (ref 5–15)
Anion gap: 6 (ref 5–15)
BUN: 19 mg/dL (ref 6–20)
BUN: 16 mg/dL (ref 6–20)
CO2: 25 mmol/L (ref 22–32)
CO2: 22 mmol/L (ref 22–32)
Calcium: 8.6 mg/dL — ABNORMAL LOW (ref 8.9–10.3)
Calcium: 7.4 mg/dL — ABNORMAL LOW (ref 8.9–10.3)
Chloride: 91 mmol/L — ABNORMAL LOW (ref 98–111)
Chloride: 103 mmol/L (ref 98–111)
Creatinine, Ser: 0.71 mg/dL (ref 0.44–1.00)
Creatinine, Ser: 0.58 mg/dL (ref 0.44–1.00)
GFR calc Af Amer: >60 mL/min (ref >60)
GFR calc Af Amer: >60 mL/min (ref >60)
GFR calc non Af Amer: >60 mL/min (ref >60)
GFR calc non Af Amer: >60 mL/min (ref >60)
Glucose, Bld: 436 mg/dL — ABNORMAL HIGH (ref 70–99)
Glucose, Bld: 285 mg/dL — ABNORMAL HIGH (ref 70–99)
Potassium: 3.8 mmol/L (ref 3.5–5.1)
Potassium: 3.1 mmol/L — ABNORMAL LOW (ref 3.5–5.1)
Sodium: 126 mmol/L — ABNORMAL LOW (ref 135–145)
Sodium: 131 mmol/L — ABNORMAL LOW (ref 135–145)

## 2019-09-29 LAB — CBC
HCT: 26.3 % — ABNORMAL LOW (ref 36.0–46.0)
Hemoglobin: 8.6 g/dL — ABNORMAL LOW (ref 12.0–15.0)
MCH: 31.4 pg (ref 26.0–34.0)
MCHC: 32.7 g/dL (ref 30.0–36.0)
MCV: 96 fL (ref 80.0–100.0)
Platelets: 54 10*3/uL — ABNORMAL LOW (ref 150–400)
RBC: 2.74 MIL/uL — ABNORMAL LOW (ref 3.87–5.11)
RDW: 16.9 % — ABNORMAL HIGH (ref 11.5–15.5)
WBC: 4.5 10*3/uL (ref 4.0–10.5)
nRBC: 0 % (ref 0.0–0.2)

## 2019-09-29 LAB — URINALYSIS, ROUTINE W REFLEX MICROSCOPIC
Bacteria, UA: NONE SEEN
Bilirubin Urine: NEGATIVE
Glucose, UA: >=500 mg/dL — AB
Ketones, ur: NEGATIVE mg/dL
Leukocytes,Ua: NEGATIVE
Nitrite: NEGATIVE
Protein, ur: NEGATIVE mg/dL
Specific Gravity, Urine: 1.023 (ref 1.005–1.030)
WBC, UA: >50 WBC/hpf — ABNORMAL HIGH (ref 0–5)
pH: 6 (ref 5.0–8.0)

## 2019-09-29 LAB — BLOOD GAS, VENOUS
Acid-Base Excess: 3.6 mmol/L — ABNORMAL HIGH (ref 0.0–2.0)
Bicarbonate: 26.9 mmol/L (ref 20.0–28.0)
FIO2: 21
O2 Saturation: 56.5 %
Patient temperature: 37
pCO2, Ven: 43.9 mmHg — ABNORMAL LOW (ref 44.0–60.0)
pH, Ven: 7.419 (ref 7.250–7.430)
pO2, Ven: 34 mmHg (ref 32.0–45.0)

## 2019-09-29 LAB — CBG MONITORING, ED
Glucose-Capillary: 236 mg/dL — ABNORMAL HIGH (ref 70–99)
Glucose-Capillary: 281 mg/dL — ABNORMAL HIGH (ref 70–99)
Glucose-Capillary: 399 mg/dL — ABNORMAL HIGH (ref 70–99)

## 2019-09-29 LAB — LACTIC ACID, PLASMA
Lactic Acid, Venous: 2.3 mmol/L (ref 0.5–1.9)
Lactic Acid, Venous: 1.9 mmol/L (ref 0.5–1.9)

## 2019-09-29 LAB — LIPASE, BLOOD: Lipase: 47 U/L (ref 11–51)

## 2019-09-29 MED ORDER — INSULIN ASPART 100 UNIT/ML ~~LOC~~ SOLN
10.0000 [IU] | Freq: Once | SUBCUTANEOUS | Status: AC
  Administered 2019-09-29: 10 [IU] via SUBCUTANEOUS
  Filled 2019-09-29: qty 1

## 2019-09-29 MED ORDER — SODIUM CHLORIDE 0.9 % IV BOLUS
1000.0000 mL | Freq: Once | INTRAVENOUS | Status: AC
  Administered 2019-09-29: 1000 mL via INTRAVENOUS

## 2019-09-29 MED ORDER — POTASSIUM CHLORIDE CRYS ER 20 MEQ PO TBCR
40.0000 meq | EXTENDED_RELEASE_TABLET | Freq: Once | ORAL | Status: AC
  Administered 2019-09-29: 40 meq via ORAL
  Filled 2019-09-29: qty 2

## 2019-09-29 NOTE — ED Provider Notes (Addendum)
[facility] EMERGENCY DEPARTMENT Provider Note   CSN: [id number] Arrival date & time: 09/28/19  2356     History Chief Complaint  Patient presents with  . Hyperglycemia    [name] is a 54 y.o. female.  Patient with history of diabetes on Jardiance and toujeo here with hyperglycemia since this evening.  She was here for the similar problem 2 days ago.  States she been doing relatively well at home but did not eat or drink much at all today.  She had a yogurt and a banana and some soup yesterday.  Reports her sugars are still in the 2-300 range at home.  This evening when she was going to bed her sugar was elevated over 600 and she was having fatigue and nausea.  She has abdominal pain which is chronic due to her pancreatitis and liver.  He denies any fevers, chills, vomiting.  No cough.  No pain with urination or blood in the urine.  No chest pain or shortness of breath.  States her abdominal pain is at baseline.  States compliance with her diabetes regimen.  The history is provided by the patient.  Hyperglycemia Associated symptoms: abdominal pain, fatigue, nausea and weakness   Associated symptoms: no chest pain, no dysuria, no fever and no shortness of breath        Past Medical History:  Diagnosis Date  . Degenerative joint disease   . Diabetes mellitus without complication ([place])   . Gastroesophageal reflux   . Pancreatitis   . Panic attack   . Sleep apnea   . Thrombocytopenia [facility])     Patient Active Problem List   Diagnosis Date Noted  . Abnormal head CT 04/02/2019  . Multiple falls 04/02/2019  . Stroke-like symptoms 04/02/2019  . Panic attack 03/30/2019  . History of COPD 03/20/2019  . Chronic abdominal pain 02/05/2019  . DKA (diabetic ketoacidoses) ([place]) 01/26/2019  . Sleep apnea   . Gastroesophageal reflux   . Hepatitis A 01/13/2019  . 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 COVID-19 01/12/2019  . Glycosuria 01/12/2019  .  Hyperbilirubinemia 01/12/2019  . Hypoalbuminemia 01/12/2019  . Normocytic anemia, not due to blood loss 01/12/2019  . Uncontrolled type 2 diabetes mellitus with hyperglycemia, with long-term current use of insulin ([place]) 01/12/2019  . Low TSH level 01/12/2019  . Chest pain 12/16/2018  . Left-sided weakness 12/16/2018  . Weight loss 12/16/2018  . Schizoaffective disorder ([place]) 03/10/2018  . Essential hypertension 03/09/2018  . H/O partial thyroidectomy 10/25/2017  . Thyroid nodule 10/25/2017  . Cricopharyngeal hypertrophy 09/26/2017  . Pharyngoesophageal dysphagia 09/26/2017  . Reaction to severe stress 09/11/2017  . Newly diagnosed infection due to multidrug resistant organism 05/14/2017  . Left hemiparesis ([place]) 04/18/2017  . Disorder of shoulder 03/03/2017  . Lactic acidosis 09/27/2016  . Pancytopenia ([place]) 09/27/2016  . Acquired hypothyroidism 09/27/2016  . Pyelonephritis 09/27/2016  . Microalbuminuria due to type 2 diabetes mellitus ([place]) 04/20/2016  . Diabetic peripheral neuropathy associated with type 2 diabetes mellitus ([place]) 12/03/2015  . Hepatic steatosis 12/03/2015  . Diabetes mellitus without complication ([place]) 76/72/0947  . Moderate episode of recurrent major depressive disorder ([place]) 03/20/2015  . Complicated UTI (urinary tract infection) 12/25/2014  . Vaginitis and vulvovaginitis 11/06/2014  . Blurry vision, bilateral 10/17/2014  . Health care maintenance 10/17/2014  . Nausea 10/17/2014  . Multinodular thyroid 07/01/2014  . Cirrhosis of liver ([place]) 03/05/2014  . Postoperative hypothyroidism 03/05/2014  . Type 2 diabetes mellitus ([place])  03/05/2014  . Thrombocytopenia ([place]) 01/17/2014  . Alcoholic cirrhosis of liver without ascites ([place]) 01/17/2014  . Back problem 05/28/2013  . Low back pain 04/12/2013  . Borderline personality disorder ([place]) 08/25/2012  . Depression 08/25/2012  . Hypercholesterolemia 08/25/2012  . Osteoarthrosis 08/25/2012  . Gastro-esophageal  reflux disease without esophagitis 08/25/2012  . Obstructive sleep apnea 08/25/2012    Past Surgical History:  Procedure Laterality Date  . CHOLECYSTECTOMY    . SHOULDER SURGERY     left   . tongue sx. titanium screw placed to hold tongue down    . TOTAL THYROIDECTOMY       OB History   No obstetric history on file.     History reviewed. No pertinent family history.  Social History   Tobacco Use  . Smoking status: Never Smoker  . Smokeless tobacco: Never Used  Vaping Use  . Vaping Use: Never used  Substance Use Topics  . Alcohol use: No  . Drug use: No    Home Medications Prior to Admission medications   Medication Sig Start Date End Date Taking? Authorizing Provider  albuterol (VENTOLIN HFA) 108 (90 Base) MCG/ACT inhaler Inhale 1-2 puffs into the lungs every 4 (four) hours as needed for wheezing or shortness of breath.  01/11/19   [provider]  ARIPiprazole (ABILIFY) 2 MG tablet Take 2 mg by mouth daily. 03/27/19   [provider]  baclofen (LIORESAL) 10 MG tablet Take 1 tablet (10 mg total) by mouth 3 (three) times daily. 02/20/19   Noemi Chapel, MD  CREON [phone] units CPEP Take 1 capsule by mouth 3 (three) times daily. 02/11/19   [provider]  empagliflozin (JARDIANCE) 25 MG TABS tablet Take 25 mg by mouth daily.  08/25/18   [provider]  furosemide (LASIX) 20 MG tablet Take 20 mg by mouth 2 (two) times daily as needed. 03/11/19   [provider]  insulin aspart (NOVOLOG FLEXPEN) 100 UNIT/ML FlexPen Inject 2-10 Units into the skin See admin instructions. Inject 2 units being the least and 10 units being the most via sliding scale.    [provider]  Insulin Glargine, 2 Unit Dial, (TOUJEO MAX SOLOSTAR) 300 UNIT/ML SOPN Inject 10 Units into the skin daily. 01/27/19   Elgergawy, Silver Huguenin, MD  levothyroxine (SYNTHROID, LEVOTHROID) 50 MCG tablet Take 1 tablet by mouth daily. Take at night. 07/25/14   [provider]  lisinopril (ZESTRIL) 10 MG tablet Take 10 mg by mouth daily. 03/29/19   [provider]  metFORMIN (GLUCOPHAGE) 1000 MG tablet Take 1,000 mg by mouth 2 (two) times daily with a meal.     [provider]  metoCLOPramide (REGLAN) 10 MG tablet Take 10 mg by mouth 4 (four) times daily.     [provider]  mirtazapine (REMERON) 7.5 MG tablet Take 7.5 mg by mouth at bedtime. 03/27/19   [provider]  ondansetron (ZOFRAN-ODT) 4 MG disintegrating tablet Take 1 tablet by mouth 2 (two) times daily.  09/18/16   [provider]  oxyCODONE (OXY IR/ROXICODONE) 5 MG immediate release tablet Take 5 mg by mouth 4 (four) times daily as needed. 04/10/19   [provider]  pantoprazole (PROTONIX) 40 MG tablet Take 40 mg by mouth daily. 03/21/19   [provider]  sertraline (ZOLOFT) 100 MG tablet Take 150 mg by mouth daily. 09/10/16   [provider]  spironolactone (ALDACTONE) 50 MG tablet Take 50 mg by mouth daily. 03/27/19  [provider]  tamsulosin (FLOMAX) 0.4 MG CAPS capsule Take 0.4 mg by mouth daily. 03/27/19   [provider]  traZODone (DESYREL) 100 MG tablet Take 1 tablet by mouth daily. 09/15/16   [provider]    Allergies    Acetaminophen, Cephalexin, Ciprofloxacin, Hydromorphone, Sulfamethoxazole-trimethoprim, Dilaudid [hydromorphone hcl], Hydromorphone hcl, Sulfamethoxazole, and Trimethoprim  Review of Systems   Review of Systems  Constitutional: Positive for activity change, appetite change and fatigue. Negative for fever.  HENT: Negative for congestion and rhinorrhea.   Respiratory: Negative for cough, chest tightness and shortness of breath.   Cardiovascular: Negative for chest pain and leg swelling.  Gastrointestinal: Positive for abdominal pain and nausea.  Genitourinary: Negative for dysuria, flank pain and hematuria.  Musculoskeletal: Positive for arthralgias and myalgias.  Skin:  Negative for wound.  Neurological: Positive for weakness. Negative for headaches.    all other systems are negative except as noted in the HPI and PMH.   Physical Exam Updated Vital Signs BP (!) 92/48 (BP Location: Right Arm)   Pulse 68   Temp 98 F (36.7 C) (Oral)   Resp 18   Ht 5' 3"  (1.6 m)   Wt 46 kg   SpO2 95%   BMI 17.96 kg/m   Physical Exam Vitals and nursing note reviewed.  Constitutional:      General: She is not in acute distress.    Appearance: She is well-developed.     Comments: -Chronic ill-appearing  HENT:     Head: Normocephalic and atraumatic.     Mouth/Throat:     Mouth: Mucous membranes are dry.     Pharynx: No oropharyngeal exudate.  Eyes:     Conjunctiva/sclera: Conjunctivae normal.     Pupils: Pupils are equal, round, and reactive to light.  Neck:     Comments: No meningismus. Cardiovascular:     Rate and Rhythm: Normal rate and regular rhythm.     Heart sounds: Normal heart sounds. No murmur heard.   Pulmonary:     Effort: Pulmonary effort is normal. No respiratory distress.     Breath sounds: Normal breath sounds.  Abdominal:     Palpations: Abdomen is soft.     Tenderness: There is abdominal tenderness. There is no guarding or rebound.     Comments: Mild diffuse tenderness, no guarding or rebound  Musculoskeletal:        General: No tenderness. Normal range of motion.     Cervical back: Normal range of motion and neck supple.  Skin:    General: Skin is warm.     Capillary Refill: Capillary refill takes less than 2 seconds.  Neurological:     General: No focal deficit present.     Mental Status: She is alert and oriented to person, place, and time. Mental status is at baseline.     Cranial Nerves: No cranial nerve deficit.     Motor: No abnormal muscle tone.     Coordination: Coordination normal.     Comments:  5/5 strength throughout. CN 2-12 intact.Equal grip strength.   Psychiatric:        Behavior: Behavior normal.     ED  Results / Procedures / Treatments   Labs (all labs ordered are listed, but only abnormal results are displayed) Labs Reviewed  BASIC METABOLIC PANEL - Abnormal; Notable for the following components:      Result Value   Sodium 126 (*)    Chloride 91 (*)    Glucose, Bld 436 (*)  Calcium 8.6 (*)    All other components within normal limits  CBC - Abnormal; Notable for the following components:   RBC 2.74 (*)    Hemoglobin 8.6 (*)    HCT 26.3 (*)    RDW 16.9 (*)    Platelets 54 (*)    All other components within normal limits  URINALYSIS, ROUTINE W REFLEX MICROSCOPIC - Abnormal; Notable for the following components:   Glucose, UA >=500 (*)    Hgb urine dipstick SMALL (*)    WBC, UA >50 (*)    All other components within normal limits  LACTIC ACID, PLASMA - Abnormal; Notable for the following components:   Lactic Acid, Venous 2.3 (*)    All other components within normal limits  BLOOD GAS, VENOUS - Abnormal; Notable for the following components:   pCO2, Ven 43.9 (*)    Acid-Base Excess 3.6 (*)    All other components within normal limits  BASIC METABOLIC PANEL - Abnormal; Notable for the following components:   Sodium 131 (*)    Potassium 3.1 (*)    Glucose, Bld 285 (*)    Calcium 7.4 (*)    All other components within normal limits  CBG MONITORING, ED - Abnormal; Notable for the following components:   Glucose-Capillary 399 (*)    All other components within normal limits  CBG MONITORING, ED - Abnormal; Notable for the following components:   Glucose-Capillary 281 (*)    All other components within normal limits  CBG MONITORING, ED - Abnormal; Notable for the following components:   Glucose-Capillary 236 (*)    All other components within normal limits  URINE CULTURE  LACTIC ACID, PLASMA  LIPASE, BLOOD  CBG MONITORING, ED  TROPONIN I (HIGH SENSITIVITY)  TROPONIN I (HIGH SENSITIVITY)    EKG None  Radiology CT ABDOMEN PELVIS W CONTRAST  Result Date:  09/27/2019 CLINICAL DATA:  Diffuse abdominal pain and elevated blood glucose levels. EXAM: CT ABDOMEN AND PELVIS WITH CONTRAST TECHNIQUE: Multidetector CT imaging of the abdomen and pelvis was performed using the standard protocol following bolus administration of intravenous contrast. CONTRAST:  185m OMNIPAQUE IOHEXOL 300 MG/ML  SOLN COMPARISON:  Aug 07, 2019 FINDINGS: Lower chest: Very mild atelectasis is seen within the bilateral lung bases. Hepatobiliary: The liver is cirrhotic in appearance. A 6 mm focus of low attenuation is seen within the posterolateral aspect of the right lobe of the liver. Status post cholecystectomy. No biliary dilatation. Pancreas: Unremarkable. No pancreatic ductal dilatation or surrounding inflammatory changes. Spleen: The spleen is markedly enlarged. Numerous dilated and tortuous vessels are seen along the splenic hilum. Adrenals/Urinary Tract: Adrenal glands are unremarkable. Kidneys are normal in size, without renal calculi or hydronephrosis. Bilateral subcentimeter simple renal cysts are seen. Bladder is unremarkable. Stomach/Bowel: There is a small hiatal hernia. The appendix is not clearly identified. No evidence of bowel wall thickening, distention, or inflammatory changes. Vascular/Lymphatic: There is mild calcification of the abdominal aorta. No enlarged abdominal or pelvic lymph nodes. Reproductive: The uterus is small and position to the right of midline. Other: No abdominal wall hernia or abnormality. No abdominopelvic ascites. Musculoskeletal: No acute or significant osseous findings. IMPRESSION: 1. Findings consistent with cirrhosis and portal hypertension. 2. Small hiatal hernia. 3. Evidence of prior cholecystectomy. 4. Aortic atherosclerosis. Aortic Atherosclerosis (ICD10-I70.0). Electronically Signed   By: TVirgina NorfolkM.D.   On: 09/27/2019 23:11   DG Chest Port 1 View  Result Date: 09/27/2019 CLINICAL DATA:  Diabetes, hyperglycemia EXAM: PORTABLE CHEST 1  VIEW  COMPARISON:  09/26/2019 FINDINGS: The heart size and mediastinal contours are within normal limits. Both lungs are clear. The visualized skeletal structures are unremarkable. IMPRESSION: No active disease. Electronically Signed   By: Randa Ngo M.D.   On: 09/27/2019 21:16    Procedures Procedures (including critical care time)  Medications Ordered in ED Medications  sodium chloride 0.9 % bolus 1,000 mL (has no administration in time range)    ED Course  I have reviewed the triage vital signs and the nursing notes.  Pertinent labs & imaging results that were available during my care of the patient were reviewed by me and considered in my medical decision making (see chart for details).    MDM Rules/Calculators/A&P                         Return visit with nausea, fatigue, hyperglycemia.  Sugar 399 on arrival.  She is hemodynamically stable.  Mildly hypotensive.  She was given IV fluids.  CT scan from 2 days ago was reassuring.  Hyperglycemia today with normal anion gap.  Hyponatremia of 126.  Patient given IV fluids and insulin.  Lactate is normal.  Blood sugar has improved to 236.  No evidence of DKA.  No ketones in urine.  Pyuria noted and culture sent.  Tolerating PO. No vomiting. BP 32-355D systolic which looks to be chronic for her.  No anion gap on recheck. CXR negative.  Troponin negative x2. EKG unchanged. Low suspicion for ACS.  Discussed keeping a record of her blood sugars closely at home and following up with her primary doctor.  Return precautions discussed.  Final Clinical Impression(s) / ED Diagnoses Final diagnoses:  Hyperglycemia    Rx / DC Orders ED Discharge Orders    None       Adyson Vanburen, Annie Main, MD 09/29/19 3220    Ezequiel Essex, MD 09/29/19 [phone]

## 2019-09-29 NOTE — ED Notes (Signed)
Pt given water for fluid challenge

## 2019-09-29 NOTE — Discharge Instructions (Addendum)
Take your insulin as prescribed.  Monitor your blood sugars on a regular basis follow-up with your doctor.   Your urine sent for culture today and you will be called if you need antibiotics for the urine. Return to the ED with new or worsening symptoms.

## 2019-09-29 NOTE — ED Notes (Signed)
Date and time results received: 09/29/19 0135  Test: Lactic Acid  Critical Value: 2.3  Name of Provider Notified: Rancour  Orders Received? Or Actions Taken?: na

## 2019-09-29 NOTE — ED Triage Notes (Signed)
Pt states her blood glucose meter was reading "high" tonight. Was seen here last night for same.

## 2019-09-30 LAB — URINE CULTURE

## 2019-10-01 ENCOUNTER — Other Ambulatory Visit: Payer: Self-pay

## 2019-10-01 ENCOUNTER — Encounter (HOSPITAL_COMMUNITY): Payer: Self-pay | Admitting: Emergency Medicine

## 2019-10-01 DIAGNOSIS — E039 Hypothyroidism, unspecified: Secondary | ICD-10-CM | POA: Insufficient documentation

## 2019-10-01 DIAGNOSIS — E1140 Type 2 diabetes mellitus with diabetic neuropathy, unspecified: Secondary | ICD-10-CM | POA: Insufficient documentation

## 2019-10-01 DIAGNOSIS — J449 Chronic obstructive pulmonary disease, unspecified: Secondary | ICD-10-CM | POA: Insufficient documentation

## 2019-10-01 DIAGNOSIS — E1110 Type 2 diabetes mellitus with ketoacidosis without coma: Secondary | ICD-10-CM | POA: Insufficient documentation

## 2019-10-01 DIAGNOSIS — G4700 Insomnia, unspecified: Secondary | ICD-10-CM | POA: Insufficient documentation

## 2019-10-01 DIAGNOSIS — E119 Type 2 diabetes mellitus without complications: Secondary | ICD-10-CM | POA: Diagnosis not present

## 2019-10-01 DIAGNOSIS — F410 Panic disorder [episodic paroxysmal anxiety] without agoraphobia: Secondary | ICD-10-CM | POA: Diagnosis present

## 2019-10-01 DIAGNOSIS — Z794 Long term (current) use of insulin: Secondary | ICD-10-CM | POA: Diagnosis not present

## 2019-10-01 DIAGNOSIS — F419 Anxiety disorder, unspecified: Secondary | ICD-10-CM | POA: Diagnosis not present

## 2019-10-01 DIAGNOSIS — Z79899 Other long term (current) drug therapy: Secondary | ICD-10-CM | POA: Diagnosis not present

## 2019-10-01 DIAGNOSIS — Z79890 Hormone replacement therapy (postmenopausal): Secondary | ICD-10-CM | POA: Diagnosis not present

## 2019-10-01 DIAGNOSIS — E1165 Type 2 diabetes mellitus with hyperglycemia: Secondary | ICD-10-CM | POA: Diagnosis not present

## 2019-10-01 NOTE — ED Triage Notes (Signed)
Patient is having increased panic attacks, since Sunday, not sure of trigger, diagnosed with anxiety, but not currently taking any medications, last panic attack about 20 minutes ago. Patient weeping in triage.

## 2019-10-02 ENCOUNTER — Emergency Department (HOSPITAL_COMMUNITY)
Admission: EM | Admit: 2019-10-02 | Discharge: 2019-10-02 | Disposition: A | Discharge status: Home / Self Care | Payer: Medicare HMO | Attending: Emergency Medicine | Admitting: Emergency Medicine

## 2019-10-02 DIAGNOSIS — G4700 Insomnia, unspecified: Secondary | ICD-10-CM

## 2019-10-02 LAB — CULTURE, BLOOD (SINGLE)
Culture: NO GROWTH
Special Requests: ADEQUATE

## 2019-10-02 MED ORDER — MELATONIN 3 MG PO TABS
9.0000 mg | ORAL_TABLET | Freq: Every day | ORAL | Status: DC
  Administered 2019-10-02: 9 mg via ORAL
  Filled 2019-10-02: qty 3

## 2019-10-02 MED ORDER — HYDROXYZINE HCL 25 MG PO TABS
50.0000 mg | ORAL_TABLET | Freq: Every evening | ORAL | 0 refills | Status: DC | PRN
Start: 2019-10-02 — End: 2019-10-10

## 2019-10-02 MED ORDER — HYDROXYZINE HCL 25 MG PO TABS
50.0000 mg | ORAL_TABLET | Freq: Once | ORAL | Status: AC
  Administered 2019-10-02: 50 mg via ORAL
  Filled 2019-10-02: qty 2

## 2019-10-02 NOTE — ED Provider Notes (Signed)
[facility] EMERGENCY DEPARTMENT Provider Note   CSN: [id number] Arrival date & time: 10/01/19  2052     History Chief Complaint  Patient presents with  . Panic Attack    [name] is a 54 y.o. female.  Recent changes in medications and havign trouble with anxiety throughout the day but worse at night with difficulty sleeping. Has tried benadryl with melatonin. Was on trazodone in past but discontinued. Has had vistaril previously with some help.    Anxiety This is a chronic problem. The current episode started more than 1 week ago. The problem occurs daily. The problem has not changed since onset.Pertinent negatives include no chest pain, no headaches and no shortness of breath. Nothing aggravates the symptoms. Nothing relieves the symptoms. She has tried nothing for the symptoms.       Past Medical History:  Diagnosis Date  . Degenerative joint disease   . Diabetes mellitus without complication ([place])   . Gastroesophageal reflux   . Pancreatitis   . Panic attack   . Sleep apnea   . Thrombocytopenia [facility])     Patient Active Problem List   Diagnosis Date Noted  . Abnormal head CT 04/02/2019  . Multiple falls 04/02/2019  . Stroke-like symptoms 04/02/2019  . Panic attack 03/30/2019  . History of COPD 03/20/2019  . Chronic abdominal pain 02/05/2019  . DKA (diabetic ketoacidoses) ([place]) 01/26/2019  . Sleep apnea   . Gastroesophageal reflux   . Hepatitis A 01/13/2019  . 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[place]) 01/12/2019  . COVID-19 01/12/2019  . Glycosuria 01/12/2019  . Hyperbilirubinemia 01/12/2019  . Hypoalbuminemia 01/12/2019  . Normocytic anemia, not due to blood loss 01/12/2019  . Uncontrolled type 2 diabetes mellitus with hyperglycemia, with long-term current use of insulin ([place]) 01/12/2019  . Low TSH level 01/12/2019  . Chest pain 12/16/2018  . Left-sided weakness 12/16/2018  . Weight loss 12/16/2018  . Schizoaffective disorder ([place])  03/10/2018  . Essential hypertension 03/09/2018  . H/O partial thyroidectomy 10/25/2017  . Thyroid nodule 10/25/2017  . Cricopharyngeal hypertrophy 09/26/2017  . Pharyngoesophageal dysphagia 09/26/2017  . Reaction to severe stress 09/11/2017  . Newly diagnosed infection due to multidrug resistant organism 05/14/2017  . Left hemiparesis ([place]) 04/18/2017  . Disorder of shoulder 03/03/2017  . Lactic acidosis 09/27/2016  . Pancytopenia ([place]) 09/27/2016  . Acquired hypothyroidism 09/27/2016  . Pyelonephritis 09/27/2016  . Microalbuminuria due to type 2 diabetes mellitus ([place]) 04/20/2016  . Diabetic peripheral neuropathy associated with type 2 diabetes mellitus ([place]) 12/03/2015  . Hepatic steatosis 12/03/2015  . Diabetes mellitus without complication ([place]) 72/11/4707  . Moderate episode of recurrent major depressive disorder ([place]) 03/20/2015  . Complicated UTI (urinary tract infection) 12/25/2014  . Vaginitis and vulvovaginitis 11/06/2014  . Blurry vision, bilateral 10/17/2014  . Health care maintenance 10/17/2014  . Nausea 10/17/2014  . Multinodular thyroid 07/01/2014  . Cirrhosis of liver ([place]) 03/05/2014  . Postoperative hypothyroidism 03/05/2014  . Type 2 diabetes mellitus ([place]) 03/05/2014  . Thrombocytopenia ([place]) 01/17/2014  . Alcoholic cirrhosis of liver without ascites ([place]) 01/17/2014  . Back problem 05/28/2013  . Low back pain 04/12/2013  . Borderline personality disorder ([place]) 08/25/2012  . Depression 08/25/2012  . Hypercholesterolemia 08/25/2012  . Osteoarthrosis 08/25/2012  . Gastro-esophageal reflux disease without esophagitis 08/25/2012  . Obstructive sleep apnea 08/25/2012    Past Surgical History:  Procedure Laterality Date  . CHOLECYSTECTOMY    . SHOULDER SURGERY     left   . tongue  sx. titanium screw placed to hold tongue down    . TOTAL THYROIDECTOMY       OB History   No obstetric history on file.     No family history on file.  Social History    Tobacco Use  . Smoking status: Never Smoker  . Smokeless tobacco: Never Used  Vaping Use  . Vaping Use: Never used  Substance Use Topics  . Alcohol use: No  . Drug use: No    Home Medications Prior to Admission medications   Medication Sig Start Date End Date Taking? Authorizing Provider  cholecalciferol (VITAMIN D3) 25 MCG (1000 UNIT) tablet Take 1,000 Units by mouth daily.   Yes [provider]  CREON 24000-76000 units CPEP Take 1 capsule by mouth 3 (three) times daily. 02/11/19  Yes [provider]  empagliflozin (JARDIANCE) 25 MG TABS tablet Take 25 mg by mouth daily.  08/25/18  Yes [provider]  folic acid (FOLVITE) 505 MCG tablet Take 400 mcg by mouth daily.   Yes [provider]  furosemide (LASIX) 20 MG tablet Take 20 mg by mouth 2 (two) times daily as needed. 03/11/19  Yes [provider]  insulin aspart (NOVOLOG FLEXPEN) 100 UNIT/ML FlexPen Inject 2-10 Units into the skin See admin instructions. Inject 2 units being the least and 10 units being the most via sliding scale.   Yes [provider]  Insulin Glargine, 2 Unit Dial, (TOUJEO MAX SOLOSTAR) 300 UNIT/ML SOPN Inject 10 Units into the skin daily. Patient taking differently: Inject 20 Units into the skin daily.  01/27/19  Yes Elgergawy, Silver Huguenin, MD  lactulose (CHRONULAC) 10 GM/15ML solution Take 10 g by mouth 2 (two) times daily as needed for mild constipation.   Yes [provider]  levothyroxine (SYNTHROID, LEVOTHROID) 50 MCG tablet Take 1 tablet by mouth daily. Take at night. 07/25/14  Yes [provider]  magnesium 30 MG tablet Take 30 mg by mouth.   Yes [provider]  ondansetron (ZOFRAN-ODT) 4 MG disintegrating tablet Take 1 tablet by mouth 2 (two) times daily.  09/18/16  Yes [provider]  oxyCODONE (OXY IR/ROXICODONE) 5 MG immediate release tablet Take by mouth 4 (four) times daily as needed.  04/10/19  Yes [provider]  pantoprazole (PROTONIX) 40 MG tablet Take 40 mg by mouth daily. 03/21/19  Yes [provider]  potassium chloride (KLOR-CON) 8 MEQ tablet Take 8 mEq by mouth daily.   Yes [provider]  albuterol (VENTOLIN HFA) 108 (90 Base) MCG/ACT inhaler Inhale 1-2 puffs into the lungs every 4 (four) hours as needed for wheezing or shortness of breath.  01/11/19   [provider]  hydrOXYzine (ATARAX/VISTARIL) 25 MG tablet Take 2 tablets (50 mg total) by mouth at bedtime as needed for anxiety (insomnia). 10/02/19   Jiayi Lengacher, Corene Cornea, MD  ARIPiprazole (ABILIFY) 2 MG tablet Take 2 mg by mouth daily. Patient not taking: Reported on 10/02/2019 03/27/19 10/02/19  [provider]  lisinopril (ZESTRIL) 10 MG tablet Take 10 mg by mouth daily. Patient not taking: Reported on 10/02/2019 03/29/19 10/02/19  [provider]  metFORMIN (GLUCOPHAGE) 1000 MG tablet Take 1,000 mg by mouth 2 (two) times daily with a meal.  Patient not taking: Reported on 10/02/2019  10/02/19  [provider]  metoCLOPramide (REGLAN) 10 MG tablet Take 10 mg by mouth 4 (four) times daily.  Patient not taking: Reported on 10/02/2019  10/02/19  [provider]  mirtazapine (REMERON)  7.5 MG tablet Take 7.5 mg by mouth at bedtime. Patient not taking: Reported on 10/02/2019 03/27/19 10/02/19  [provider]  sertraline (ZOLOFT) 100 MG tablet Take 150 mg by mouth daily. Patient not taking: Reported on 10/02/2019 09/10/16 10/02/19  [provider]  spironolactone (ALDACTONE) 50 MG tablet Take 50 mg by mouth daily. Patient not taking: Reported on 10/02/2019 03/27/19 10/02/19  [provider]  traZODone (DESYREL) 100 MG tablet Take 1 tablet by mouth daily. Patient not taking: Reported on 10/02/2019 09/15/16 10/02/19  [provider]    Allergies    Acetaminophen, Cephalexin, Ciprofloxacin, Hydromorphone, Sulfamethoxazole-trimethoprim, Dilaudid [hydromorphone hcl], Hydromorphone  hcl, Sulfamethoxazole, and Trimethoprim  Review of Systems   Review of Systems  Respiratory: Negative for shortness of breath.   Cardiovascular: Negative for chest pain.  Neurological: Negative for headaches.  Psychiatric/Behavioral: Positive for decreased concentration and sleep disturbance. The patient is nervous/anxious.   All other systems reviewed and are negative.   Physical Exam Updated Vital Signs BP (!) 143/68 (BP Location: Right Arm)   Pulse 99   Temp 98.6 F (37 C) (Oral)   Resp 20   Ht 5' 3"  (1.6 m)   Wt 56.2 kg   SpO2 98%   BMI 21.97 kg/m   Physical Exam Vitals and nursing note reviewed.  Constitutional:      Appearance: She is well-developed.  HENT:     Head: Normocephalic and atraumatic.     Mouth/Throat:     Mouth: Mucous membranes are moist.     Pharynx: Oropharynx is clear.  Eyes:     Conjunctiva/sclera: Conjunctivae normal.     Pupils: Pupils are equal, round, and reactive to light.  Cardiovascular:     Rate and Rhythm: Normal rate and regular rhythm.  Pulmonary:     Effort: No respiratory distress.     Breath sounds: No stridor.  Abdominal:     General: Abdomen is flat. There is no distension.  Musculoskeletal:        General: No swelling or tenderness. Normal range of motion.     Cervical back: Normal range of motion.  Skin:    General: Skin is warm and dry.  Neurological:     General: No focal deficit present.     Mental Status: She is alert.     ED Results / Procedures / Treatments   Labs (all labs ordered are listed, but only abnormal results are displayed) Labs Reviewed - No data to display  EKG None  Radiology No results found.  Procedures Procedures (including critical care time)  Medications Ordered in ED Medications  melatonin tablet 9 mg (9 mg Oral Given 10/02/19 0115)  hydrOXYzine (ATARAX/VISTARIL) tablet 50 mg (50 mg Oral Given 10/02/19 0116)    ED Course  I have reviewed the triage vital signs and the nursing  notes.  Pertinent labs & imaging results that were available during my care of the patient were reviewed by me and considered in my medical decision making (see chart for details).    MDM Rules/Calculators/A&P                          Anxiety, needs outpatient and pcp follow up to help with same, will try hydroxyzine for now. No indication for further workup in ED.   Final Clinical Impression(s) / ED Diagnoses Final diagnoses:  Insomnia, unspecified type    Rx / DC Orders ED Discharge Orders  Ordered    hydrOXYzine (ATARAX/VISTARIL) 25 MG tablet  At bedtime PRN     Discontinue  Reprint     10/02/19 0108           Raniah Karan, Corene Cornea, MD 10/02/19 2446

## 2019-10-03 ENCOUNTER — Emergency Department (HOSPITAL_COMMUNITY)
Admission: EM | Admit: 2019-10-03 | Discharge: 2019-10-04 | Disposition: A | Discharge status: Home / Self Care | Payer: Medicare HMO | Attending: Emergency Medicine | Admitting: Emergency Medicine

## 2019-10-03 ENCOUNTER — Encounter (HOSPITAL_COMMUNITY): Payer: Self-pay

## 2019-10-03 ENCOUNTER — Other Ambulatory Visit: Payer: Self-pay

## 2019-10-03 DIAGNOSIS — Z79899 Other long term (current) drug therapy: Secondary | ICD-10-CM | POA: Diagnosis not present

## 2019-10-03 DIAGNOSIS — Z20822 Contact with and (suspected) exposure to covid-19: Secondary | ICD-10-CM | POA: Insufficient documentation

## 2019-10-03 DIAGNOSIS — E119 Type 2 diabetes mellitus without complications: Secondary | ICD-10-CM | POA: Diagnosis not present

## 2019-10-03 DIAGNOSIS — R45851 Suicidal ideations: Secondary | ICD-10-CM | POA: Insufficient documentation

## 2019-10-03 DIAGNOSIS — F332 Major depressive disorder, recurrent severe without psychotic features: Secondary | ICD-10-CM | POA: Insufficient documentation

## 2019-10-03 DIAGNOSIS — E039 Hypothyroidism, unspecified: Secondary | ICD-10-CM | POA: Diagnosis not present

## 2019-10-03 DIAGNOSIS — Z794 Long term (current) use of insulin: Secondary | ICD-10-CM | POA: Diagnosis not present

## 2019-10-03 DIAGNOSIS — Z046 Encounter for general psychiatric examination, requested by authority: Secondary | ICD-10-CM | POA: Diagnosis present

## 2019-10-03 LAB — CBC
HCT: 27.7 % — ABNORMAL LOW (ref 36.0–46.0)
Hemoglobin: 9.1 g/dL — ABNORMAL LOW (ref 12.0–15.0)
MCH: 30.7 pg (ref 26.0–34.0)
MCHC: 32.9 g/dL (ref 30.0–36.0)
MCV: 93.6 fL (ref 80.0–100.0)
Platelets: 63 10*3/uL — ABNORMAL LOW (ref 150–400)
RBC: 2.96 MIL/uL — ABNORMAL LOW (ref 3.87–5.11)
RDW: 15.9 % — ABNORMAL HIGH (ref 11.5–15.5)
WBC: 5 10*3/uL (ref 4.0–10.5)
nRBC: 0 % (ref 0.0–0.2)

## 2019-10-03 LAB — COMPREHENSIVE METABOLIC PANEL
ALT: 16 U/L (ref 0–44)
AST: 33 U/L (ref 15–41)
Albumin: 2.6 g/dL — ABNORMAL LOW (ref 3.5–5.0)
Alkaline Phosphatase: 88 U/L (ref 38–126)
Anion gap: 10 (ref 5–15)
BUN: 19 mg/dL (ref 6–20)
CO2: 23 mmol/L (ref 22–32)
Calcium: 9.4 mg/dL (ref 8.9–10.3)
Chloride: 94 mmol/L — ABNORMAL LOW (ref 98–111)
Creatinine, Ser: 0.73 mg/dL (ref 0.44–1.00)
GFR calc Af Amer: >60 mL/min (ref >60)
GFR calc non Af Amer: >60 mL/min (ref >60)
Glucose, Bld: 293 mg/dL — ABNORMAL HIGH (ref 70–99)
Potassium: 4.2 mmol/L (ref 3.5–5.1)
Sodium: 127 mmol/L — ABNORMAL LOW (ref 135–145)
Total Bilirubin: 2.8 mg/dL — ABNORMAL HIGH (ref 0.3–1.2)
Total Protein: 7 g/dL (ref 6.5–8.1)

## 2019-10-03 LAB — RAPID URINE DRUG SCREEN, HOSP PERFORMED
Amphetamines: NOT DETECTED
Barbiturates: NOT DETECTED
Benzodiazepines: NOT DETECTED
Cocaine: NOT DETECTED
Opiates: NOT DETECTED
Tetrahydrocannabinol: NOT DETECTED

## 2019-10-03 LAB — ACETAMINOPHEN LEVEL: Acetaminophen (Tylenol), Serum: <10 ug/mL — ABNORMAL LOW (ref 10–30)

## 2019-10-03 LAB — ETHANOL: Alcohol, Ethyl (B): <10 mg/dL (ref <10)

## 2019-10-03 LAB — SALICYLATE LEVEL: Salicylate Lvl: <7 mg/dL — ABNORMAL LOW (ref 7.0–30.0)

## 2019-10-03 MED ORDER — PANTOPRAZOLE SODIUM 40 MG PO TBEC
40.0000 mg | DELAYED_RELEASE_TABLET | Freq: Every day | ORAL | Status: DC
  Administered 2019-10-04: 40 mg via ORAL
  Filled 2019-10-03: qty 1

## 2019-10-03 MED ORDER — VITAMIN D 25 MCG (1000 UNIT) PO TABS
1000.0000 [IU] | ORAL_TABLET | Freq: Every day | ORAL | Status: DC
  Administered 2019-10-04: 1000 [IU] via ORAL
  Filled 2019-10-03: qty 1

## 2019-10-03 MED ORDER — PANCRELIPASE (LIP-PROT-AMYL) 12000-38000 UNITS PO CPEP
24000.0000 [IU] | ORAL_CAPSULE | Freq: Three times a day (TID) | ORAL | Status: DC
  Filled 2019-10-03: qty 2

## 2019-10-03 MED ORDER — SODIUM CHLORIDE 0.9 % IV BOLUS: 1000.0000 mL | Freq: Once | INTRAVENOUS | Status: DC

## 2019-10-03 MED ORDER — HYDROXYZINE HCL 50 MG PO TABS
50.0000 mg | ORAL_TABLET | Freq: Every evening | ORAL | Status: DC | PRN
  Administered 2019-10-04: 50 mg via ORAL
  Filled 2019-10-03: qty 1

## 2019-10-03 MED ORDER — LEVOTHYROXINE SODIUM 50 MCG PO TABS
50.0000 ug | ORAL_TABLET | Freq: Every day | ORAL | Status: DC
  Administered 2019-10-04: 50 ug via ORAL
  Filled 2019-10-03: qty 1

## 2019-10-03 MED ORDER — FUROSEMIDE 20 MG PO TABS: 20.0000 mg | ORAL_TABLET | ORAL | Status: DC

## 2019-10-03 MED ORDER — ONDANSETRON 4 MG PO TBDP
4.0000 mg | ORAL_TABLET | Freq: Three times a day (TID) | ORAL | Status: DC
  Administered 2019-10-04: 4 mg via ORAL
  Filled 2019-10-03: qty 1

## 2019-10-03 MED ORDER — POTASSIUM CHLORIDE CRYS ER 10 MEQ PO TBCR
10.0000 meq | EXTENDED_RELEASE_TABLET | Freq: Two times a day (BID) | ORAL | Status: DC
  Administered 2019-10-04: 10 meq via ORAL
  Filled 2019-10-03: qty 1

## 2019-10-03 MED ORDER — EMPAGLIFLOZIN 25 MG PO TABS
25.0000 mg | ORAL_TABLET | Freq: Every day | ORAL | Status: DC
  Filled 2019-10-03: qty 1

## 2019-10-03 MED ORDER — ALBUTEROL SULFATE (2.5 MG/3ML) 0.083% IN NEBU: 2.5000 mg | INHALATION_SOLUTION | Freq: Four times a day (QID) | RESPIRATORY_TRACT | Status: DC | PRN

## 2019-10-03 MED ORDER — INSULIN LISPRO (1 UNIT DIAL) 100 UNIT/ML (KWIKPEN): 3.0000 [IU] | PEN_INJECTOR | SUBCUTANEOUS | Status: DC

## 2019-10-03 MED ORDER — MELATONIN 3 MG PO TABS
3.0000 mg | ORAL_TABLET | Freq: Every evening | ORAL | Status: DC | PRN
  Administered 2019-10-04: 3 mg via ORAL
  Filled 2019-10-03: qty 1

## 2019-10-03 MED ORDER — OXYCODONE HCL 5 MG PO TABS
5.0000 mg | ORAL_TABLET | Freq: Four times a day (QID) | ORAL | Status: DC | PRN
  Administered 2019-10-04: 5 mg via ORAL
  Filled 2019-10-03: qty 1

## 2019-10-03 MED ORDER — INSULIN GLARGINE 100 UNIT/ML ~~LOC~~ SOLN
20.0000 [IU] | Freq: Every day | SUBCUTANEOUS | Status: DC
  Administered 2019-10-04: 20 [IU] via SUBCUTANEOUS
  Filled 2019-10-03: qty 0.2

## 2019-10-03 MED ORDER — LACTULOSE 10 GM/15ML PO SOLN: 20.0000 g | Freq: Two times a day (BID) | ORAL | Status: DC

## 2019-10-03 MED ORDER — TIZANIDINE HCL 4 MG PO TABS
4.0000 mg | ORAL_TABLET | Freq: Three times a day (TID) | ORAL | Status: DC | PRN
  Administered 2019-10-04: 4 mg via ORAL
  Filled 2019-10-03: qty 1

## 2019-10-03 NOTE — ED Provider Notes (Signed)
[facility] EMERGENCY DEPARTMENT Provider Note   CSN: [id number] Arrival date & time: 10/03/19  1930     History Chief Complaint  Patient presents with   Psychiatric Evaluation    [name] is a 54 y.o. woman with history of schizoaffective disorder, DM, CHF, COPD, pancreatitis, and liver cirrhosis who presents to the ED with suicidal ideation.  The patient states, "my depression is bad." She reports yesterday she was crying more and having trouble sleeping, and when she woke up this morning she felt like "the world would be better off" without her. She states if she doesn't get help, she will drive her car off a bridge. She attributes her depressed mood to recent stressors such as financial instability and the burden of her chronic medical problems. She states she has been on many different medications for her depression over the years but is not currently taking any or seeing a psychiatrist. She states she has been working with her PCP to treat her mood symptoms.   She denies current or recent fever/chills, HA, CP, SOB, diarrhea. Endorses chronic abdominal pain.  She has had several ED visits in the last month. Presented to the ED yesterday with chief complaint of anxiety and insomnia. She was treated with hydroxyzine and discharged home with recommendation to follow-up with outpatient providers. Prior to that, she had several visits for hyperglycemia as well as a visit for leg swelling which resolved with diuretics. She was recently admitted to an OSH from 2/97-9/8 with metabolic encephalopathy.     Past Medical History:  Diagnosis Date   Degenerative joint disease    Diabetes mellitus without complication ([place])    Gastroesophageal reflux    Pancreatitis    Panic attack    Sleep apnea    Thrombocytopenia ([place])     Patient Active Problem List   Diagnosis Date Noted   Abnormal head CT 04/02/2019   Multiple falls 04/02/2019   Stroke-like  symptoms 04/02/2019   Panic attack 03/30/2019   History of COPD 03/20/2019   Chronic abdominal pain 02/05/2019   DKA (diabetic ketoacidoses) ([place]) 01/26/2019   Sleep apnea    Gastroesophageal reflux    Hepatitis A 01/13/2019   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[place]) 01/12/2019   COVID-19 01/12/2019   Glycosuria 01/12/2019   Hyperbilirubinemia 01/12/2019   Hypoalbuminemia 01/12/2019   Normocytic anemia, not due to blood loss 01/12/2019   Uncontrolled type 2 diabetes mellitus with hyperglycemia, with long-term current use of insulin ([place]) 01/12/2019   Low TSH level 01/12/2019   Chest pain 12/16/2018   Left-sided weakness 12/16/2018   Weight loss 12/16/2018   Schizoaffective disorder ([place]) 03/10/2018   Essential hypertension 03/09/2018   H/O partial thyroidectomy 10/25/2017   Thyroid nodule 10/25/2017   Cricopharyngeal hypertrophy 09/26/2017   Pharyngoesophageal dysphagia 09/26/2017   Reaction to severe stress 09/11/2017   Newly diagnosed infection due to multidrug resistant organism 05/14/2017   Left hemiparesis ([place]) 04/18/2017   Disorder of shoulder 03/03/2017   Lactic acidosis 09/27/2016   Pancytopenia ([place]) 09/27/2016   Acquired hypothyroidism 09/27/2016   Pyelonephritis 09/27/2016   Microalbuminuria due to type 2 diabetes mellitus ([place]) 04/20/2016   Diabetic peripheral neuropathy associated with type 2 diabetes mellitus ([place]) 12/03/2015   Hepatic steatosis 12/03/2015   Diabetes mellitus without complication ([place]) 92/01/9416   Moderate episode of recurrent major depressive disorder ([place]) 40/81/4481   Complicated UTI (urinary tract infection) 12/25/2014   Vaginitis and vulvovaginitis 11/06/2014   Blurry vision, bilateral  10/17/2014   Health care maintenance 10/17/2014   Nausea 10/17/2014   Multinodular thyroid 07/01/2014   Cirrhosis of liver ([place]) 03/05/2014   Postoperative hypothyroidism 03/05/2014   Type 2  diabetes mellitus ([place]) 03/05/2014   Thrombocytopenia ([place]) 31/54/0086   Alcoholic cirrhosis of liver without ascites ([place]) 01/17/2014   Back problem 05/28/2013   Low back pain 04/12/2013   Borderline personality disorder ([place]) 08/25/2012   Depression 08/25/2012   Hypercholesterolemia 08/25/2012   Osteoarthrosis 08/25/2012   Gastro-esophageal reflux disease without esophagitis 08/25/2012   Obstructive sleep apnea 08/25/2012    Past Surgical History:  Procedure Laterality Date   CHOLECYSTECTOMY     SHOULDER SURGERY     left    tongue sx. titanium screw placed to hold tongue down     TOTAL THYROIDECTOMY       OB History   No obstetric history on file.     History reviewed. No pertinent family history.  Social History   Tobacco Use   Smoking status: Never Smoker   Smokeless tobacco: Never Used  Vaping Use   Vaping Use: Never used  Substance Use Topics   Alcohol use: No   Drug use: No    Home Medications Prior to Admission medications   Medication Sig Start Date End Date Taking? Authorizing Provider  albuterol (PROVENTIL) (2.5 MG/3ML) 0.083% nebulizer solution Inhale 2.5 mg into the lungs every 6 (six) hours as needed for wheezing or shortness of breath.  04/19/19  Yes [provider]  albuterol (VENTOLIN HFA) 108 (90 Base) MCG/ACT inhaler Inhale 2 puffs into the lungs every 4 (four) hours as needed for wheezing or shortness of breath.  01/11/19  Yes [provider]  cholecalciferol (VITAMIN D3) 25 MCG (1000 UNIT) tablet Take 1,000 Units by mouth daily.   Yes [provider]  CREON 24000-76000 units CPEP Take 24,000 Units by mouth 3 (three) times daily.  02/11/19  Yes [provider]  diphenhydrAMINE (BENADRYL) 25 mg capsule Take 25 mg by mouth every 6 (six) hours as needed for itching.   Yes [provider]  empagliflozin (JARDIANCE) 25 MG TABS tablet Take 25 mg by mouth daily.  08/25/18  Yes [provider]  furosemide (LASIX) 20 MG tablet Take 20-40 mg by mouth See admin instructions. Take 20 mg by mouth in the morning and an additional 20 mg once daily as needed for fluid retention 03/11/19  Yes [provider]  hydrOXYzine (ATARAX/VISTARIL) 25 MG tablet Take 2 tablets (50 mg total) by mouth at bedtime as needed for anxiety (insomnia). 10/02/19  Yes Mesner, Corene Cornea, MD  Insulin Glargine, 2 Unit Dial, (TOUJEO MAX SOLOSTAR) 300 UNIT/ML SOPN Inject 10 Units into the skin daily. Patient taking differently: Inject 20 Units into the skin in the morning.  01/27/19  Yes Elgergawy, Silver Huguenin, MD  insulin lispro (HUMALOG KWIKPEN) 100 UNIT/ML KwikPen Inject 3-10 Units into the skin See admin instructions. Inject 3-10 units into the skin three times a day with meals, per sliding scale   Yes [provider]  lactulose (CHRONULAC) 10 GM/15ML solution Take 20 g by mouth See admin instructions. Take 20 grams by mouth two times a day and hold for diarrhea   Yes [provider]  levothyroxine (SYNTHROID, LEVOTHROID) 50 MCG tablet Take 50 mcg by mouth daily before breakfast.  07/25/14  Yes [provider]  lipase/protease/amylase (CREON) 12000-38000 units CPEP capsule Take 12,000 Units by mouth See admin instructions. Take 12,000 units by mouth one  to two times a day with snacks   Yes [provider]  MAGNESIUM PO Take 1 tablet by mouth in the morning and at bedtime.   Yes [provider]  melatonin 3 MG TABS tablet Take 3 mg by mouth at bedtime as needed (for sleep).   Yes [provider]  ondansetron (ZOFRAN-ODT) 4 MG disintegrating tablet Take 4 mg by mouth 3 (three) times daily before meals. Dissolve 4 mg sublingually three times a day before meals and snacks 09/18/16  Yes [provider]  Oxycodone HCl 10 MG TABS Take 5 mg by mouth 4 (four) times daily as needed (for pain).   Yes [provider]  OXYGEN Inhale 2 L/min into the lungs  as needed (for shortness of breath).   Yes [provider]  pantoprazole (PROTONIX) 40 MG tablet Take 40 mg by mouth daily before breakfast.  03/21/19  Yes [provider]  potassium chloride (KLOR-CON) 8 MEQ tablet Take 8 mEq by mouth 2 (two) times daily.    Yes [provider]  tiZANidine (ZANAFLEX) 4 MG tablet Take 4 mg by mouth 3 (three) times daily as needed for muscle spasms.  09/24/19  Yes [provider]  folic acid (FOLVITE) 761 MCG tablet Take 400 mcg by mouth daily. Patient not taking: Reported on 10/03/2019    [provider]  ARIPiprazole (ABILIFY) 2 MG tablet Take 2 mg by mouth daily. Patient not taking: Reported on 10/02/2019 03/27/19 10/02/19  [provider]  lisinopril (ZESTRIL) 10 MG tablet Take 10 mg by mouth daily. Patient not taking: Reported on 10/02/2019 03/29/19 10/02/19  [provider]  metFORMIN (GLUCOPHAGE) 1000 MG tablet Take 1,000 mg by mouth 2 (two) times daily with a meal.  Patient not taking: Reported on 10/02/2019  10/02/19  [provider]  metoCLOPramide (REGLAN) 10 MG tablet Take 10 mg by mouth 4 (four) times daily.  Patient not taking: Reported on 10/02/2019  10/02/19  [provider]  mirtazapine (REMERON) 7.5 MG tablet Take 7.5 mg by mouth at bedtime. Patient not taking: Reported on 10/02/2019 03/27/19 10/02/19  [provider]  sertraline (ZOLOFT) 100 MG tablet Take 150 mg by mouth daily. Patient not taking: Reported on 10/02/2019 09/10/16 10/02/19  [provider]  spironolactone (ALDACTONE) 50 MG tablet Take 50 mg by mouth daily. Patient not taking: Reported on 10/02/2019 03/27/19 10/02/19  [provider]  traZODone (DESYREL) 100 MG tablet Take 1 tablet by mouth daily. Patient not taking: Reported on 10/02/2019 09/15/16 10/02/19  [provider]    Allergies    Acetaminophen, Cephalexin, Ciprofloxacin, Hydromorphone, Sulfamethoxazole,  Sulfamethoxazole-trimethoprim, Trimethoprim, Dilaudid [hydromorphone hcl], and Hydromorphone hcl  Review of Systems   Review of Systems  All other systems reviewed and are negative.  10 systems reviewed and are negative for acute changes, except as noted in the HPI.  Physical Exam Updated Vital Signs BP (!) 106/55 (BP Location: Left Arm)    Pulse 80    Temp 99.2 F (37.3 C) (Oral)    Resp 16    Wt 57.6 kg    SpO2 98%    BMI 22.50 kg/m   Physical Exam Constitutional:      Comments: Thin- and chronically-ill appearing woman, tearful at times  HENT:     Head: Normocephalic and atraumatic.     Mouth/Throat:     Mouth: Mucous membranes are moist.  Cardiovascular:     Rate and Rhythm: Normal rate and regular rhythm.  Pulses: Normal pulses.     Heart sounds: Murmur heard.  Systolic murmur is present.   Musculoskeletal:     Right lower leg: 1+ Edema present.     Left lower leg: 1+ Edema present.  Skin:    General: Skin is warm and dry.  Neurological:     Mental Status: She is alert and oriented to person, place, and time.  Psychiatric:        Attention and Perception: Attention normal. She does not perceive auditory or visual hallucinations.        Mood and Affect: Mood is depressed. Affect is tearful.        Speech: Speech normal.        Behavior: Behavior is cooperative.        Thought Content: Thought content includes suicidal ideation. Thought content does not include homicidal ideation. Thought content includes suicidal plan. Thought content does not include homicidal plan.     ED Results / Procedures / Treatments   Labs (all labs ordered are listed, but only abnormal results are displayed) Labs Reviewed  COMPREHENSIVE METABOLIC PANEL - Abnormal; Notable for the following components:      Result Value   Sodium 127 (*)    Chloride 94 (*)    Glucose, Bld 293 (*)    Albumin 2.6 (*)    Total Bilirubin 2.8 (*)    All other components within normal limits  SALICYLATE  LEVEL - Abnormal; Notable for the following components:   Salicylate Lvl <3.7 (*)    All other components within normal limits  ACETAMINOPHEN LEVEL - Abnormal; Notable for the following components:   Acetaminophen (Tylenol), Serum <10 (*)    All other components within normal limits  CBC - Abnormal; Notable for the following components:   RBC 2.96 (*)    Hemoglobin 9.1 (*)    HCT 27.7 (*)    RDW 15.9 (*)    Platelets 63 (*)    All other components within normal limits  I-STAT BETA HCG BLOOD, ED ([facility], [facility], [facility] ONLY) - Abnormal; Notable for the following components:   I-stat hCG, quantitative 5.5 (*)    All other components within normal limits  SARS CORONAVIRUS 2 BY RT PCR (HOSPITAL ORDER, [facility])  ETHANOL  RAPID URINE DRUG SCREEN, HOSP PERFORMED    EKG None  Radiology No results found.   Medications Ordered in ED Medications  albuterol (PROVENTIL) (2.5 MG/3ML) 0.083% nebulizer solution 2.5 mg (has no administration in time range)  lipase/protease/amylase (CREON) capsule 24,000 Units (has no administration in time range)  cholecalciferol (VITAMIN D3) tablet 1,000 Units (has no administration in time range)  empagliflozin (JARDIANCE) tablet 25 mg (has no administration in time range)  furosemide (LASIX) tablet 20-40 mg (has no administration in time range)  hydrOXYzine (ATARAX/VISTARIL) tablet 50 mg (has no administration in time range)  insulin glargine (2 Unit Dial) (TOUJEO MAX) Solostar Pen SOPN 20 Units (has no administration in time range)  insulin lispro (HUMALOG) KwikPen 3-10 Units (has no administration in time range)  lactulose (CHRONULAC) 10 GM/15ML solution 20 g (has no administration in time range)  levothyroxine (SYNTHROID) tablet 50 mcg (has no administration in time range)  melatonin tablet 3 mg (has no administration in time range)  ondansetron (ZOFRAN-ODT) disintegrating tablet 4 mg (has no administration in time range)  oxyCODONE  (Oxy IR/ROXICODONE) immediate release tablet 5 mg (has no administration in time range)  pantoprazole (PROTONIX) EC tablet 40 mg (has  no administration in time range)  potassium chloride (KLOR-CON) CR tablet 10 mEq (has no administration in time range)  tiZANidine (ZANAFLEX) tablet 4 mg (has no administration in time range)    ED Course  I have reviewed the triage vital signs and the nursing notes.  Pertinent labs & imaging results that were available during my care of the patient were reviewed by me and considered in my medical decision making (see chart for details).    MDM Rules/Calculators/A&P                          [name] is a 54 y.o. woman with history of schizoaffective disorder, DM, CHF, COPD, pancreatitis, and liver cirrhosis who presents to the ED with suicidal ideation.  Patient is chronically ill appearing with stable vitals. She has many chronic medical issues with several recent ED visits. Laboratory evaluations were consistent with her chronic medical issues and did not indicate any acute changes. She is medically cleared for behavioral health evaluation.  Final Clinical Impression(s) / ED Diagnoses Final diagnoses:  Suicidal ideation    Rx / DC Orders ED Discharge Orders    None       Alexandria Lodge, MD 10/04/19 [id number]    Carmin Muskrat, MD 10/05/19 [phone]

## 2019-10-03 NOTE — ED Triage Notes (Signed)
Pt presents via POV c/o "depression". Reports "I want to drive my car off of a bridge". Pt reports attempted to hurt self in past. Denies OP psych follow. Pt currently voluntary.

## 2019-10-04 ENCOUNTER — Encounter (HOSPITAL_COMMUNITY): Payer: Self-pay | Admitting: Psychiatry

## 2019-10-04 ENCOUNTER — Inpatient Hospital Stay (HOSPITAL_COMMUNITY)
Admission: AD | Admit: 2019-10-04 | Discharge: 2019-10-10 | DRG: 885 | Disposition: A | Discharge status: Home / Self Care | Payer: Medicare HMO | Source: Intra-hospital | Attending: Psychiatry | Admitting: Psychiatry

## 2019-10-04 DIAGNOSIS — Z20822 Contact with and (suspected) exposure to covid-19: Secondary | ICD-10-CM | POA: Diagnosis present

## 2019-10-04 DIAGNOSIS — K766 Portal hypertension: Secondary | ICD-10-CM | POA: Diagnosis present

## 2019-10-04 DIAGNOSIS — K861 Other chronic pancreatitis: Secondary | ICD-10-CM | POA: Diagnosis present

## 2019-10-04 DIAGNOSIS — G4700 Insomnia, unspecified: Secondary | ICD-10-CM | POA: Diagnosis present

## 2019-10-04 DIAGNOSIS — Z794 Long term (current) use of insulin: Secondary | ICD-10-CM

## 2019-10-04 DIAGNOSIS — F323 Major depressive disorder, single episode, severe with psychotic features: Secondary | ICD-10-CM | POA: Diagnosis not present

## 2019-10-04 DIAGNOSIS — D638 Anemia in other chronic diseases classified elsewhere: Secondary | ICD-10-CM | POA: Diagnosis present

## 2019-10-04 DIAGNOSIS — F32A Depression, unspecified: Secondary | ICD-10-CM | POA: Diagnosis present

## 2019-10-04 DIAGNOSIS — F333 Major depressive disorder, recurrent, severe with psychotic symptoms: Secondary | ICD-10-CM

## 2019-10-04 DIAGNOSIS — K7460 Unspecified cirrhosis of liver: Secondary | ICD-10-CM | POA: Diagnosis present

## 2019-10-04 DIAGNOSIS — F332 Major depressive disorder, recurrent severe without psychotic features: Secondary | ICD-10-CM | POA: Diagnosis not present

## 2019-10-04 DIAGNOSIS — K219 Gastro-esophageal reflux disease without esophagitis: Secondary | ICD-10-CM | POA: Diagnosis present

## 2019-10-04 DIAGNOSIS — F411 Generalized anxiety disorder: Secondary | ICD-10-CM | POA: Diagnosis present

## 2019-10-04 DIAGNOSIS — R45851 Suicidal ideations: Secondary | ICD-10-CM | POA: Diagnosis present

## 2019-10-04 DIAGNOSIS — M62838 Other muscle spasm: Secondary | ICD-10-CM | POA: Diagnosis present

## 2019-10-04 DIAGNOSIS — E119 Type 2 diabetes mellitus without complications: Secondary | ICD-10-CM | POA: Diagnosis present

## 2019-10-04 DIAGNOSIS — J449 Chronic obstructive pulmonary disease, unspecified: Secondary | ICD-10-CM | POA: Diagnosis present

## 2019-10-04 LAB — URINALYSIS, COMPLETE (UACMP) WITH MICROSCOPIC
Bilirubin Urine: NEGATIVE
Glucose, UA: >=500 mg/dL — AB
Ketones, ur: NEGATIVE mg/dL
Nitrite: NEGATIVE
Protein, ur: NEGATIVE mg/dL
Specific Gravity, Urine: 1.026 (ref 1.005–1.030)
pH: 6 (ref 5.0–8.0)

## 2019-10-04 LAB — GLUCOSE, CAPILLARY
Glucose-Capillary: 449 mg/dL — ABNORMAL HIGH (ref 70–99)
Glucose-Capillary: 298 mg/dL — ABNORMAL HIGH (ref 70–99)
Glucose-Capillary: 190 mg/dL — ABNORMAL HIGH (ref 70–99)
Glucose-Capillary: 265 mg/dL — ABNORMAL HIGH (ref 70–99)

## 2019-10-04 LAB — TSH: TSH: 0.522 u[IU]/mL (ref 0.350–4.500)

## 2019-10-04 LAB — CBG MONITORING, ED: Glucose-Capillary: 204 mg/dL — ABNORMAL HIGH (ref 70–99)

## 2019-10-04 LAB — I-STAT BETA HCG BLOOD, ED (MC, WL, AP ONLY): I-stat hCG, quantitative: 5.5 m[IU]/mL — ABNORMAL HIGH (ref <5)

## 2019-10-04 LAB — HEMOGLOBIN A1C
Hgb A1c MFr Bld: 7.4 % — ABNORMAL HIGH (ref 4.8–5.6)
Mean Plasma Glucose: 165.68 mg/dL

## 2019-10-04 LAB — SARS CORONAVIRUS 2 BY RT PCR (HOSPITAL ORDER, PERFORMED IN ~~LOC~~ HOSPITAL LAB): SARS Coronavirus 2: NEGATIVE

## 2019-10-04 MED ORDER — ONDANSETRON HCL 4 MG PO TABS
8.0000 mg | ORAL_TABLET | Freq: Three times a day (TID) | ORAL | Status: DC | PRN
  Administered 2019-10-04 – 2019-10-05 (×2): 8 mg via ORAL
  Filled 2019-10-04 (×3): qty 2

## 2019-10-04 MED ORDER — PANCRELIPASE (LIP-PROT-AMYL) 12000-38000 UNITS PO CPEP
24000.0000 [IU] | ORAL_CAPSULE | Freq: Three times a day (TID) | ORAL | Status: DC
  Administered 2019-10-04 – 2019-10-10 (×17): 24000 [IU] via ORAL
  Filled 2019-10-04 (×21): qty 2

## 2019-10-04 MED ORDER — FLUCONAZOLE 100 MG PO TABS
100.0000 mg | ORAL_TABLET | Freq: Every day | ORAL | Status: DC
  Administered 2019-10-04 – 2019-10-10 (×7): 100 mg via ORAL
  Filled 2019-10-04 (×9): qty 1

## 2019-10-04 MED ORDER — VITAMIN D (ERGOCALCIFEROL) 1.25 MG (50000 UNIT) PO CAPS
50000.0000 [IU] | ORAL_CAPSULE | ORAL | Status: DC
  Administered 2019-10-04: 50000 [IU] via ORAL
  Filled 2019-10-04 (×3): qty 1

## 2019-10-04 MED ORDER — SPIRONOLACTONE 12.5 MG HALF TABLET
12.5000 mg | ORAL_TABLET | Freq: Every day | ORAL | Status: DC
  Administered 2019-10-04 – 2019-10-07 (×4): 12.5 mg via ORAL
  Filled 2019-10-04 (×5): qty 1

## 2019-10-04 MED ORDER — INSULIN ASPART 100 UNIT/ML ~~LOC~~ SOLN
2.0000 [IU] | Freq: Once | SUBCUTANEOUS | Status: AC
  Administered 2019-10-04: 2 [IU] via SUBCUTANEOUS

## 2019-10-04 MED ORDER — INSULIN GLARGINE 100 UNIT/ML ~~LOC~~ SOLN
15.0000 [IU] | Freq: Every day | SUBCUTANEOUS | Status: DC
  Administered 2019-10-05: 15 [IU] via SUBCUTANEOUS

## 2019-10-04 MED ORDER — GABAPENTIN 100 MG PO CAPS
100.0000 mg | ORAL_CAPSULE | Freq: Three times a day (TID) | ORAL | Status: DC
  Administered 2019-10-04 – 2019-10-07 (×6): 100 mg via ORAL
  Filled 2019-10-04 (×12): qty 1

## 2019-10-04 MED ORDER — ARIPIPRAZOLE 2 MG PO TABS
2.0000 mg | ORAL_TABLET | Freq: Every day | ORAL | Status: DC
  Administered 2019-10-04 – 2019-10-07 (×4): 2 mg via ORAL
  Filled 2019-10-04 (×6): qty 1

## 2019-10-04 MED ORDER — TIZANIDINE HCL 2 MG PO TABS
4.0000 mg | ORAL_TABLET | Freq: Three times a day (TID) | ORAL | Status: DC | PRN
  Administered 2019-10-06 – 2019-10-08 (×2): 4 mg via ORAL
  Filled 2019-10-04 (×2): qty 2

## 2019-10-04 MED ORDER — EMPAGLIFLOZIN 25 MG PO TABS
25.0000 mg | ORAL_TABLET | Freq: Every day | ORAL | Status: DC
  Administered 2019-10-04 – 2019-10-10 (×7): 25 mg via ORAL
  Filled 2019-10-04 (×8): qty 1

## 2019-10-04 MED ORDER — AMOXICILLIN 250 MG PO CHEW
250.0000 mg | CHEWABLE_TABLET | Freq: Three times a day (TID) | ORAL | Status: DC
  Filled 2019-10-04 (×3): qty 1

## 2019-10-04 MED ORDER — LACTULOSE 10 GM/15ML PO SOLN
20.0000 g | Freq: Two times a day (BID) | ORAL | Status: DC
  Administered 2019-10-04 – 2019-10-10 (×10): 20 g via ORAL
  Filled 2019-10-04 (×15): qty 30

## 2019-10-04 MED ORDER — FOLIC ACID 1 MG PO TABS
1.0000 mg | ORAL_TABLET | Freq: Every day | ORAL | Status: DC
  Administered 2019-10-04 – 2019-10-10 (×7): 1 mg via ORAL
  Filled 2019-10-04 (×8): qty 1

## 2019-10-04 MED ORDER — IPRATROPIUM-ALBUTEROL 0.5-2.5 (3) MG/3ML IN SOLN: 3.0000 mL | RESPIRATORY_TRACT | Status: DC | PRN

## 2019-10-04 MED ORDER — LEVOTHYROXINE SODIUM 50 MCG PO TABS
50.0000 ug | ORAL_TABLET | Freq: Every day | ORAL | Status: DC
  Administered 2019-10-05 – 2019-10-10 (×6): 50 ug via ORAL
  Filled 2019-10-04 (×8): qty 1

## 2019-10-04 MED ORDER — INSULIN ASPART 100 UNIT/ML ~~LOC~~ SOLN
4.0000 [IU] | Freq: Three times a day (TID) | SUBCUTANEOUS | Status: DC
  Administered 2019-10-04 – 2019-10-05 (×2): 4 [IU] via SUBCUTANEOUS

## 2019-10-04 MED ORDER — MAGNESIUM HYDROXIDE 400 MG/5ML PO SUSP: 30.0000 mL | Freq: Every day | ORAL | Status: DC | PRN

## 2019-10-04 MED ORDER — PANTOPRAZOLE SODIUM 40 MG PO TBEC
40.0000 mg | DELAYED_RELEASE_TABLET | Freq: Every day | ORAL | Status: DC
  Administered 2019-10-05 – 2019-10-10 (×6): 40 mg via ORAL
  Filled 2019-10-04 (×8): qty 1

## 2019-10-04 MED ORDER — INSULIN ASPART 100 UNIT/ML ~~LOC~~ SOLN: 0.0000 [IU] | Freq: Three times a day (TID) | SUBCUTANEOUS | Status: DC

## 2019-10-04 MED ORDER — MEGESTROL ACETATE 40 MG PO TABS
40.0000 mg | ORAL_TABLET | Freq: Every day | ORAL | Status: DC
  Administered 2019-10-04 – 2019-10-10 (×7): 40 mg via ORAL
  Filled 2019-10-04 (×8): qty 1

## 2019-10-04 MED ORDER — GABAPENTIN 100 MG PO CAPS
200.0000 mg | ORAL_CAPSULE | Freq: Three times a day (TID) | ORAL | Status: DC
  Filled 2019-10-04 (×3): qty 2

## 2019-10-04 MED ORDER — ALUM & MAG HYDROXIDE-SIMETH 200-200-20 MG/5ML PO SUSP: 30.0000 mL | ORAL | Status: DC | PRN

## 2019-10-04 MED ORDER — AMOXICILLIN 250 MG PO CAPS
250.0000 mg | ORAL_CAPSULE | Freq: Three times a day (TID) | ORAL | Status: DC
  Administered 2019-10-04 – 2019-10-10 (×18): 250 mg via ORAL
  Filled 2019-10-04 (×22): qty 1

## 2019-10-04 MED ORDER — INSULIN GLARGINE 100 UNIT/ML ~~LOC~~ SOLN
10.0000 [IU] | Freq: Every day | SUBCUTANEOUS | Status: DC
  Administered 2019-10-04: 10 [IU] via SUBCUTANEOUS

## 2019-10-04 MED ORDER — MIRTAZAPINE 7.5 MG PO TABS
7.5000 mg | ORAL_TABLET | Freq: Every day | ORAL | Status: DC
  Administered 2019-10-04 – 2019-10-06 (×3): 7.5 mg via ORAL
  Filled 2019-10-04 (×5): qty 1

## 2019-10-04 MED ORDER — INSULIN ASPART 100 UNIT/ML ~~LOC~~ SOLN
0.0000 [IU] | Freq: Three times a day (TID) | SUBCUTANEOUS | Status: DC
  Administered 2019-10-04: 15 [IU] via SUBCUTANEOUS
  Administered 2019-10-04: 3 [IU] via SUBCUTANEOUS
  Administered 2019-10-05 (×2): 2 [IU] via SUBCUTANEOUS
  Administered 2019-10-05: 5 [IU] via SUBCUTANEOUS
  Administered 2019-10-06 (×2): 8 [IU] via SUBCUTANEOUS
  Administered 2019-10-07 (×3): 5 [IU] via SUBCUTANEOUS
  Administered 2019-10-08: 3 [IU] via SUBCUTANEOUS
  Administered 2019-10-08: 11 [IU] via SUBCUTANEOUS
  Administered 2019-10-08: 5 [IU] via SUBCUTANEOUS
  Administered 2019-10-09: 3 [IU] via SUBCUTANEOUS
  Administered 2019-10-09 – 2019-10-10 (×3): 5 [IU] via SUBCUTANEOUS

## 2019-10-04 MED ORDER — INSULIN ASPART 100 UNIT/ML ~~LOC~~ SOLN: 0.0000 [IU] | Freq: Every day | SUBCUTANEOUS | Status: DC

## 2019-10-04 MED ORDER — TRAZODONE HCL 50 MG PO TABS: 50.0000 mg | ORAL_TABLET | Freq: Every evening | ORAL | Status: DC | PRN

## 2019-10-04 MED ORDER — HYDROXYZINE HCL 25 MG PO TABS
25.0000 mg | ORAL_TABLET | Freq: Three times a day (TID) | ORAL | Status: DC | PRN
  Administered 2019-10-06 – 2019-10-09 (×4): 25 mg via ORAL
  Filled 2019-10-04 (×3): qty 1

## 2019-10-04 MED ORDER — FLUOXETINE HCL 10 MG PO CAPS
10.0000 mg | ORAL_CAPSULE | Freq: Every day | ORAL | Status: DC
  Administered 2019-10-04 – 2019-10-07 (×4): 10 mg via ORAL
  Filled 2019-10-04 (×6): qty 1

## 2019-10-04 MED ORDER — ALBUTEROL SULFATE HFA 108 (90 BASE) MCG/ACT IN AERS: 1.0000 | INHALATION_SPRAY | Freq: Four times a day (QID) | RESPIRATORY_TRACT | Status: DC | PRN

## 2019-10-04 NOTE — B[facility] Counselor (Signed)
Disposition: Talbot Grumbling, NP recommends inpatient treatment. Per Felix Pacini, RN pt has been admitted to [facility] and assigned to 304-1. Pt can come after 0900. Attending physician: Dr. Mallie Darting. Nursing report: [phone]. Disposition discussed with Abe People, RN.    Vertell Novak, [place], [facility], [facility] Triage Specialist [phone]

## 2019-10-04 NOTE — H&P (Signed)
Psychiatric Admission Assessment Adult  Patient Identification: [name]  MRN:  [id number]  Date of Evaluation:  10/04/2019  Chief Complaint: Worsening symptoms of depression (Wishing there is no point to continue living, panic attacks & insomnia).  Principal Diagnosis: Major depressive disorder, single episode, severe with psychotic features ([place])  Diagnosis:  Principal Problem:   Major depressive disorder, single episode, severe with psychotic features ([place]) Active Problems:   Depression  History of Present Illness: This is an admission assessment for this 54 year old Caucasian female with prior hx of depression, currently dealing with Diabetes mellitus & Pancreatitis. She is being admitted to the [facility] from the [facility] ED with complaints of worsening symptoms of depression, worsening anxiety & panic symptoms. Apparently, has been in the past treated for depression using Mirtazapine, Fluoxetine & Depakote. She was sent to the [facility] for evaluation & treatment of this worsening depression. During this evaluation, Briane reports; "I was in this hospital a long time ago. But, I was dropped off this time  at the [facility] ED yesterday by my boyfriend.. I have been depressed, have bad anxiety & could not sleep at all at night. I have been feeling worthless & hopeless wishing I was dead. These feelings & thoughts has been going on for over 2 months. I do not know what caused me to be feeling this way. I have had depression for many years. I was on medications, but have not been on medications in over 12 months. I was doing better when I was on medicines for the depression. I had taken different kind of medicines (Depakote, Prozac & Mirtazapine). But, I did not do well on the Depakote. My body was unable to tolerate it. It was hard on my body & it did not help my depression. My doctor stopped me from taking it. I was also hearing voices the other day, telling me that my life  worth nothing. I almost believed it because I have bad health problems (diabetes & pancreatitis). I have lost a lot of weight in the last few years. My blood sugar keep running high on me. It was 600 the other day. I feel horrible. If I'm going to continue to feel this way, I might as well be dead. I'm not on drugs, I don't drink alcohol or smoke cigarettes".   Associated Signs/Symptoms:  Depression Symptoms:  depressed mood, anhedonia, insomnia, feelings of worthlessness/guilt, hopelessness, anxiety, weight loss,  (Hypo) Manic Symptoms:  Hallucinations, Labiality of Mood,  Anxiety Symptoms:  Excessive Worry, Panic Symptoms,  Psychotic Symptoms:  Hallucinations: Auditory  PTSD Symptoms: NA  Total Time spent with patient: 1 hour  Past Psychiatric History: Major depression.  Is the patient at risk to self? Yes.    Has the patient been a risk to self in the past 6 months? Yes.    Has the patient been a risk to self within the distant past? Yes.    Is the patient a risk to others? No.  Has the patient been a risk to others in the past 6 months? No.  Has the patient been a risk to others within the distant past? No.   Prior Inpatient Therapy: Yes  Prior Outpatient Therapy: "Yes, along time ago, I can't remember the name of the provider any more".  Alcohol Screening: "I don't drink or use any kind of drugs".  Substance Abuse History in the last 12 months:  No.  Consequences of Substance Abuse: NA  Previous Psychotropic  Medications: Yes, "Depakote, Prozac & Remeron". Did not do well on Depakote, body could not tolerate it.  Psychological Evaluations: No   Past Medical History:  Past Medical History:  Diagnosis Date  . Degenerative joint disease   . Diabetes mellitus without complication ([place])   . Gastroesophageal reflux   . Pancreatitis   . Panic attack   . Sleep apnea   . Thrombocytopenia ([place])     Past Surgical History:  Procedure Laterality Date  .  CHOLECYSTECTOMY    . SHOULDER SURGERY     left   . tongue sx. titanium screw placed to hold tongue down    . TOTAL THYROIDECTOMY     Family History: No family history on file.  Family Psychiatric  History: Major depression: Both parents.  Tobacco Screening:    Social History:  Social History   Substance and Sexual Activity  Alcohol Use No     Social History   Substance and Sexual Activity  Drug Use No    Additional Social History:  Allergies:   Allergies  Allergen Reactions  . Acetaminophen Hives and Other (See Comments)    Tolerates Fioricet  . Cephalexin Itching, Swelling and Other (See Comments)    Tongue swells and makes throat itchy- can tolerate Zosyn  . Ciprofloxacin Hives  . Hydromorphone Hives  . Sulfamethoxazole Shortness Of Breath, Swelling and Other (See Comments)    Tongue swells  . Sulfamethoxazole-Trimethoprim Shortness Of Breath, Swelling and Other (See Comments)    Tongue swells   . Trimethoprim Shortness Of Breath, Swelling and Other (See Comments)    Tongue swells  . Dilaudid [Hydromorphone Hcl] Hives  . Hydromorphone Hcl Hives   Lab Results:  Results for orders placed or performed during the hospital encounter of 10/03/19 (from the past 48 hour(s))  Rapid urine drug screen (hospital performed)     Status: None   Collection Time: 10/03/19  7:46 PM  Result Value Ref Range   Opiates NONE DETECTED NONE DETECTED   Cocaine NONE DETECTED NONE DETECTED   Benzodiazepines NONE DETECTED NONE DETECTED   Amphetamines NONE DETECTED NONE DETECTED   Tetrahydrocannabinol NONE DETECTED NONE DETECTED   Barbiturates NONE DETECTED NONE DETECTED    Comment: (NOTE) DRUG SCREEN FOR MEDICAL PURPOSES ONLY.  IF CONFIRMATION IS NEEDED FOR ANY PURPOSE, NOTIFY LAB WITHIN 5 DAYS.  LOWEST DETECTABLE LIMITS FOR URINE DRUG SCREEN Drug Class                     Cutoff (ng/mL) Amphetamine and metabolites    1000 Barbiturate and metabolites    200 Benzodiazepine                  016 Tricyclics and metabolites     300 Opiates and metabolites        300 Cocaine and metabolites        300 THC                            50 Performed at [facility], [place] [address] [place], [place] [address]   Comprehensive metabolic panel     Status: Abnormal   Collection Time: 10/03/19  8:07 PM  Result Value Ref Range   Sodium 127 (L) 135 - 145 mmol/L   Potassium 4.2 3.5 - 5.1 mmol/L   Chloride 94 (L) 98 - 111 mmol/L   CO2 23 22 - 32 mmol/L   Glucose, Bld  293 (H) 70 - 99 mg/dL    Comment: Glucose reference range applies only to samples taken after fasting for at least 8 hours.   BUN 19 6 - 20 mg/dL   Creatinine, Ser 0.73 0.44 - 1.00 mg/dL   Calcium 9.4 8.9 - 10.3 mg/dL   Total Protein 7.0 6.5 - 8.1 g/dL   Albumin 2.6 (L) 3.5 - 5.0 g/dL   AST 33 15 - 41 U/L   ALT 16 0 - 44 U/L   Alkaline Phosphatase 88 38 - 126 U/L   Total Bilirubin 2.8 (H) 0.3 - 1.2 mg/dL   GFR calc non Af Amer >60 >60 mL/min   GFR calc Af Amer >60 >60 mL/min   Anion gap 10 5 - 15    Comment: Performed at [facility], [place] [address], [place], [place] [address]  Ethanol     Status: None   Collection Time: 10/03/19  8:07 PM  Result Value Ref Range   Alcohol, Ethyl (B) <10 <10 mg/dL    Comment: (NOTE) Lowest detectable limit for serum alcohol is 10 mg/dL.  For medical purposes only. Performed at [facility], [place] [address], [place], [place] [address]   Salicylate level     Status: Abnormal   Collection Time: 10/03/19  8:07 PM  Result Value Ref Range   Salicylate Lvl <2.3 (L) 7.0 - 30.0 mg/dL    Comment: Performed at [place] [address] [place], [place] [address]  Acetaminophen level     Status: Abnormal   Collection Time: 10/03/19  8:07 PM  Result Value Ref Range   Acetaminophen (Tylenol), Serum <10 (L) 10 - 30 ug/mL    Comment: (NOTE) Therapeutic concentrations vary significantly. A range of 10-30 ug/mL  may be an effective concentration for many  patients. However, some  are best treated at concentrations outside of this range. Acetaminophen concentrations >150 ug/mL at 4 hours after ingestion  and >50 ug/mL at 12 hours after ingestion are often associated with  toxic reactions.  Performed at [facility], [place] [address], [place], [place] [address]   cbc     Status: Abnormal   Collection Time: 10/03/19  8:07 PM  Result Value Ref Range   WBC 5.0 4.0 - 10.5 K/uL   RBC 2.96 (L) 3.87 - 5.11 MIL/uL   Hemoglobin 9.1 (L) 12.0 - 15.0 g/dL   HCT 27.7 (L) 36 - 46 %   MCV 93.6 80.0 - 100.0 fL   MCH 30.7 26.0 - 34.0 pg   MCHC 32.9 30.0 - 36.0 g/dL   RDW 15.9 (H) 11.5 - 15.5 %   Platelets 63 (L) 150 - 400 K/uL    Comment: REPEATED TO VERIFY PLATELET COUNT CONFIRMED BY SMEAR SPECIMEN CHECKED FOR CLOTS Immature Platelet Fraction may be clinically indicated, consider ordering this additional test [id number]    nRBC 0.0 0.0 - 0.2 %    Comment: Performed at [facility], [place] [address], [place], [place] [address]  SARS Coronavirus 2 by RT PCR (hospital order, performed in [facility] hospital lab) Nasopharyngeal Nasopharyngeal Swab     Status: None   Collection Time: 10/04/19 12:00 AM   Specimen: Nasopharyngeal Swab  Result Value Ref Range   SARS Coronavirus 2 NEGATIVE NEGATIVE    Comment: (NOTE) SARS-CoV-2 target nucleic acids are NOT DETECTED.  The SARS-CoV-2 RNA is generally detectable in upper and lower respiratory specimens during the acute phase of infection. The  lowest concentration of SARS-CoV-2 viral copies this assay can detect is 250 copies / mL. A negative result does not preclude SARS-CoV-2 infection and should not be used as the sole basis for treatment or other patient management decisions.  A negative result may occur with improper specimen collection / handling, submission of specimen other than nasopharyngeal swab, presence of viral mutation(s) within the areas targeted by this assay, and inadequate  number of viral copies (<250 copies / mL). A negative result must be combined with clinical observations, patient history, and epidemiological information.  Fact Sheet for Patients:   StrictlyIdeas.no  Fact Sheet for Healthcare Providers: BankingDealers.co.za  This test is not yet approved or  cleared by the [place] FDA and has been authorized for detection and/or diagnosis of SARS-CoV-2 by FDA under an Emergency Use Authorization (EUA).  This EUA will remain in effect (meaning this test can be used) for the duration of the COVID-19 declaration under Section 564(b)(1) of the Act, 21 U.S.C. section 360bbb-3(b)(1), unless the authorization is terminated or revoked sooner.  Performed at [facility], [place] [address] [place], [place] [address]   I-Stat beta hCG blood, ED     Status: Abnormal   Collection Time: 10/04/19 12:08 AM  Result Value Ref Range   I-stat hCG, quantitative 5.5 (H) <5 mIU/mL   Comment 3            Comment:   GEST. AGE      CONC.  (mIU/mL)   <=1 WEEK        5 - 50     2 WEEKS       50 - 500     3 WEEKS       100 - 10,000     4 WEEKS     1,000 - 30,000        FEMALE AND NON-PREGNANT FEMALE:     LESS THAN 5 mIU/mL   CBG monitoring, ED     Status: Abnormal   Collection Time: 10/04/19  5:57 AM  Result Value Ref Range   Glucose-Capillary 204 (H) 70 - 99 mg/dL    Comment: Glucose reference range applies only to samples taken after fasting for at least 8 hours.   Comment 1 Notify RN    Blood Alcohol level:  Lab Results  Component Value Date   ETH <10 60/12/9321   Metabolic Disorder Labs:  Lab Results  Component Value Date   HGBA1C 9.0 (H) 01/26/2019   MPG 211.6 01/26/2019   No results found for: PROLACTIN No results found for: CHOL, TRIG, HDL, CHOLHDL, VLDL, LDLCALC  Current Medications: Current Facility-Administered Medications  Medication Dose Route Frequency Provider Last Rate Last Admin  .  albuterol (VENTOLIN HFA) 108 (90 Base) MCG/ACT inhaler 1-2 puff  1-2 puff Inhalation Q6H PRN Sharma Covert, MD      . alum & mag hydroxide-simeth (MAALOX/MYLANTA) 200-200-20 MG/5ML suspension 30 mL  30 mL Oral Q4H PRN Sharma Covert, MD      . ARIPiprazole (ABILIFY) tablet 2 mg  2 mg Oral Daily Sharma Covert, MD      . empagliflozin (JARDIANCE) tablet 25 mg  25 mg Oral Daily Sharma Covert, MD      . folic acid (FOLVITE) tablet 1 mg  1 mg Oral Daily Sharma Covert, MD      . gabapentin (NEURONTIN) capsule 100 mg  100 mg Oral TID Encarnacion Slates, NP      .  hydrOXYzine (ATARAX/VISTARIL) tablet 25 mg  25 mg Oral TID PRN Sharma Covert, MD      . insulin aspart (novoLOG) injection 0-15 Units  0-15 Units Subcutaneous TID WC Sharma Covert, MD      . insulin aspart (novoLOG) injection 4 Units  4 Units Subcutaneous TID WC Sharma Covert, MD      . insulin glargine (LANTUS) injection 10 Units  10 Units Subcutaneous Daily Sharma Covert, MD      . ipratropium-albuterol (DUONEB) 0.5-2.5 (3) MG/3ML nebulizer solution 3 mL  3 mL Nebulization Q4H PRN Sharma Covert, MD      . lactulose ([place]) 10 GM/15ML solution 20 g  20 g Oral BID Sharma Covert, MD      . levothyroxine (SYNTHROID) tablet 50 mcg  50 mcg Oral Q0600 Sharma Covert, MD      . lipase/protease/amylase (CREON) capsule 24,000 Units  24,000 Units Oral TID AC Sharma Covert, MD      . magnesium hydroxide (MILK OF MAGNESIA) suspension 30 mL  30 mL Oral Daily PRN Sharma Covert, MD      . ondansetron [facility]) tablet 8 mg  8 mg Oral Q8H PRN Sharma Covert, MD      . pantoprazole (PROTONIX) EC tablet 40 mg  40 mg Oral Daily Sharma Covert, MD      . tiZANidine (ZANAFLEX) tablet 4 mg  4 mg Oral Q8H PRN Sharma Covert, MD      . traZODone (DESYREL) tablet 50 mg  50 mg Oral QHS PRN Sharma Covert, MD      . Vitamin D (Ergocalciferol) (DRISDOL) capsule 50,000 Units  50,000 Units  Oral Q Esmond Camper, MD       PTA Medications: Medications Prior to Admission  Medication Sig Dispense Refill Last Dose  . albuterol (PROVENTIL) (2.5 MG/3ML) 0.083% nebulizer solution Inhale 2.5 mg into the lungs every 6 (six) hours as needed for wheezing or shortness of breath.      Marland Kitchen albuterol (VENTOLIN HFA) 108 (90 Base) MCG/ACT inhaler Inhale 2 puffs into the lungs every 4 (four) hours as needed for wheezing or shortness of breath.      . cholecalciferol (VITAMIN D3) 25 MCG (1000 UNIT) tablet Take 1,000 Units by mouth daily.     Marland Kitchen CREON 24000-76000 units CPEP Take 24,000 Units by mouth 3 (three) times daily.      . diphenhydrAMINE (BENADRYL) 25 mg capsule Take 25 mg by mouth every 6 (six) hours as needed for itching.     . empagliflozin (JARDIANCE) 25 MG TABS tablet Take 25 mg by mouth daily.      . folic acid (FOLVITE) 945 MCG tablet Take 400 mcg by mouth daily. (Patient not taking: Reported on 10/03/2019)     . furosemide (LASIX) 20 MG tablet Take 20-40 mg by mouth See admin instructions. Take 20 mg by mouth in the morning and an additional 20 mg once daily as needed for fluid retention     . hydrOXYzine (ATARAX/VISTARIL) 25 MG tablet Take 2 tablets (50 mg total) by mouth at bedtime as needed for anxiety (insomnia). 30 tablet 0   . Insulin Glargine, 2 Unit Dial, (TOUJEO MAX SOLOSTAR) 300 UNIT/ML SOPN Inject 10 Units into the skin daily. (Patient taking differently: Inject 20 Units into the skin in the morning. ) 1 pen 1   . insulin lispro (HUMALOG KWIKPEN) 100 UNIT/ML KwikPen Inject 3-10 Units into the skin  See admin instructions. Inject 3-10 units into the skin three times a day with meals, per sliding scale     . lactulose (CHRONULAC) 10 GM/15ML solution Take 20 g by mouth See admin instructions. Take 20 grams by mouth two times a day and hold for diarrhea     . levothyroxine (SYNTHROID, LEVOTHROID) 50 MCG tablet Take 50 mcg by mouth daily before breakfast.      .  lipase/protease/amylase (CREON) 12000-38000 units CPEP capsule Take 12,000 Units by mouth See admin instructions. Take 12,000 units by mouth one to two times a day with snacks     . MAGNESIUM PO Take 1 tablet by mouth in the morning and at bedtime.     . melatonin 3 MG TABS tablet Take 3 mg by mouth at bedtime as needed (for sleep).     . ondansetron (ZOFRAN-ODT) 4 MG disintegrating tablet Take 4 mg by mouth 3 (three) times daily before meals. Dissolve 4 mg sublingually three times a day before meals and snacks     . Oxycodone HCl 10 MG TABS Take 5 mg by mouth 4 (four) times daily as needed (for pain).     . OXYGEN Inhale 2 L/min into the lungs as needed (for shortness of breath).     . pantoprazole (PROTONIX) 40 MG tablet Take 40 mg by mouth daily before breakfast.      . potassium chloride (KLOR-CON) 8 MEQ tablet Take 8 mEq by mouth 2 (two) times daily.      Marland Kitchen tiZANidine (ZANAFLEX) 4 MG tablet Take 4 mg by mouth 3 (three) times daily as needed for muscle spasms.       Musculoskeletal: Strength & Muscle Tone: within normal limits Gait & Station: normal Patient leans: N/A  Psychiatric Specialty Exam: Physical Exam Vitals and nursing note reviewed.  HENT:     Head: Normocephalic.     Nose: Nose normal.     Mouth/Throat:     Pharynx: Oropharynx is clear.  Eyes:     Pupils: Pupils are equal, round, and reactive to light.  Cardiovascular:     Rate and Rhythm: Normal rate.     Pulses: Normal pulses.  Pulmonary:     Effort: Pulmonary effort is normal.  Genitourinary:    Comments: Deferred Musculoskeletal:        General: Normal range of motion.     Cervical back: Normal range of motion.  Skin:    General: Skin is warm and dry.  Neurological:     Mental Status: She is alert and oriented to person, place, and time.     Review of Systems  Constitutional: Negative for chills, diaphoresis and fever.  HENT: Negative for congestion, rhinorrhea, sneezing and sore throat.   Eyes:  Negative for discharge.  Respiratory: Negative for cough, chest tightness, shortness of breath and wheezing.   Cardiovascular: Negative for chest pain and palpitations.  Gastrointestinal: Negative for diarrhea, nausea and vomiting.  Endocrine: Negative for cold intolerance.  Genitourinary: Negative for difficulty urinating.  Musculoskeletal: Negative for arthralgias and myalgias.  Skin: Negative.   Allergic/Immunologic: Negative for environmental allergies and food allergies.       Allergies: Acetaminophen   Cephalexin   Ciprofloxacin Hydromorphone Sulfamethoxazole  Trimethoprim Trimethoprim Dilaudid (Hydromorphone)   Neurological: Negative for dizziness, tremors, seizures, syncope, facial asymmetry, speech difficulty, light-headedness, numbness and headaches.  Psychiatric/Behavioral: Positive for dysphoric mood and sleep disturbance. Negative for agitation, behavioral problems, confusion, decreased concentration, hallucinations, self-injury and suicidal ideas. The patient is nervous/anxious.  The patient is not hyperactive.     Blood pressure (!) 91/44, pulse 61, temperature 97.7 F (36.5 C), temperature source Oral, resp. rate 16, height 5' 3"  (1.6 m), weight 46.5 kg.Body mass index is 18.16 kg/m.  General Appearance: Casual, frail, moves sluggishly.  Eye Contact:  Fair  Speech:  Clear and Coherent and Slow  Volume:  Decreased  Mood:  Anxious, Depressed and Hopeless  Affect:  Flat  Thought Process:  Coherent and Descriptions of Associations: Intact  Orientation:  Full (Time, Place, and Person)  Thought Content:  Logical and Hallucinations: Auditory  Suicidal Thoughts:  Yes.  without intent/plan  Homicidal Thoughts:  Denies  Memory:  Immediate;   Good Recent;   Good Remote;   Fair  Judgement:  Fair  Insight:  Present  Psychomotor Activity:  Decreased  Concentration:  Concentration: Fair and Attention Span: Fair  Recall:  [employer] of Knowledge:  Fair  Language:  Good   Akathisia:  NA  Handed:  Right  AIMS (if indicated):     Assets:  Communication Skills Desire for Improvement Social Support  ADL's:  Intact  Cognition:  WNL  Sleep:      Treatment Plan Summary: Daily contact with patient to assess and evaluate symptoms and progress in treatment and Medication management.  Treatment Plan/Recommendations:  1. Admit for crisis management and stabilization, estimated length of stay 3-5 days.   2. Medication management to reduce current symptoms to base line and improve the patient's overall level of functioning:See MAR, Md's SRA &treatment plan.  Patient recommended for inpatient hospitalization  Patient will participate in the therapeutic group milieu.  Discharge disposition started & in progress.   Observation Level/Precautions:  15 minute checks  Laboratory:  Per ED  Psychotherapy: Group session  Medications:  See [facility]  Consultations: As needed.   Discharge Concerns: Safety, mood stability.  Estimated LOS: 3-5 days  Other: Admit to the [facility].   Physician Treatment Plan for Primary Diagnosis: Major depressive disorder, single episode, severe with psychotic features ([place])  Long Term Goal(s): Improvement in symptoms so as ready for discharge  Short Term Goals: Ability to identify changes in lifestyle to reduce recurrence of condition will improve, Ability to verbalize feelings will improve and Ability to demonstrate self-control will improve  Physician Treatment Plan for Secondary Diagnosis: Principal Problem:   Major depressive disorder, single episode, severe with psychotic features ([place]) Active Problems:   Depression  Long Term Goal(s): Improvement in symptoms so as ready for discharge  Short Term Goals: Ability to identify and develop effective coping behaviors will improve, Ability to maintain clinical measurements within normal limits will improve and Compliance with prescribed medications will improve  I certify that  inpatient services furnished can reasonably be expected to improve the patient's condition.    Lindell Spar, NP, PMHNP, FNP-BC 7/16/202111:33 AM

## 2019-10-04 NOTE — BH Assessment (Signed)
Clinician attempted to call the TTS cart to complete pt's TTS assessment however received message, "can not connect call." Clinician messaged pt's nurse on the error.    Joan Novak, MS, [facility], [facility] Triage Specialist [phone].

## 2019-10-04 NOTE — BHH Counselor (Signed)
Clinician called spoke to [place], RN to discuss error message received. RN restart the cart, clinician to call the cart in 10 minutes. Clinician to call pt's nurse if error message appears.    Vertell Novak, MS, [facility], [facility] Triage Specialist [phone].

## 2019-10-04 NOTE — ED Notes (Addendum)
RN called AC at [facility] and spoke to [employer], [place]. RN discussed pt's BP. Pt mentating and alert but drowsy. She states she hasnt slept in 3 days. [place] asked that RN give her gatorade to drink then send her via [employer] to help raise her BP. RN agreed. RN discussed with Dr. Langston Masker. RN gave 32 oz gatorade and took to [employer] in [employer].

## 2019-10-04 NOTE — ED Notes (Signed)
Pt reporting anxiety attack. RN advised that she just got meds and try to give them more time to work.

## 2019-10-04 NOTE — BHH Suicide Risk Assessment (Signed)
[facility] Admission Suicide Risk Assessment   Nursing information obtained from:    Demographic factors:    Current Mental Status:    Loss Factors:    Historical Factors:    Risk Reduction Factors:     Total Time spent with patient: 45 minutes Principal Problem: Major depressive disorder, single episode, severe with psychotic features ([place]) Diagnosis:  Principal Problem:   Major depressive disorder, single episode, severe with psychotic features ([place]) Active Problems:   Depression  Subjective Data: Patient is seen and examined.  Patient is a 54 year old female with an unspecified past psychiatric history who originally presented to the [facility] emergency department after her boyfriend dropped her off.  The patient stated that she was not sleeping well, she was depressed, having anxiety and panic attacks.  She stated she was upset over the fact that she was dying.  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  She also has a history of alcoholic liver disease.  The patient stated that she did not drink the past, but overdosed on Tylenol multiple times between teenage years to when she was in her 9s or 41s.  She also has a history of cirrhosis with portal hypertension, small volume abdominal ascites and previous admissions for hepatic encephalopathy.  She also has a history of diabetes.  She stated that she had last seen a psychiatrist a couple years ago.  She was seen in either [facility] or [facility] but did not like them.  She stated she moved from [place] to [place] approximately 3 years ago.  She stated that she had been admitted to the psychiatric hospital many times.  As best as I can see her last psychiatric admission was at [facility] in December 2020 and January 2021.  At that time she had presented to the emergency room with suicidal ideation.  She was depressed because she was told she was dying secondary to liver disease.  She felt  like overdosing on medications.  In the emergency department she was found to be in DKA and was admitted to the medical service.  She has had multiple emergency room visits secondary to abdominal pain and other issues.  It does appear that she was discharged to a skilled nursing facility in January 2021.  Her discharge psychiatric medications at that time included Abilify 2 mg p.o. daily and mirtazapine 7.5 mg p.o. nightly.  She is very vague, and has multiple complaints of physical issues.  She was admitted to the psychiatric hospital for evaluation and stabilization.  Continued Clinical Symptoms:    The "Alcohol Use Disorders Identification Test", Guidelines for Use in Primary Care, Second Edition.  World Pharmacologist [facility]). Score between 0-7:  no or low risk or alcohol related problems. Score between 8-15:  moderate risk of alcohol related problems. Score between 16-19:  high risk of alcohol related problems. Score 20 or above:  warrants further diagnostic evaluation for alcohol dependence and treatment.   CLINICAL FACTORS:   Depression:   Anhedonia Hopelessness Impulsivity Insomnia Previous Psychiatric Diagnoses and Treatments Medical Diagnoses and Treatments/Surgeries   Musculoskeletal: Strength & Muscle Tone: decreased Gait & Station: unsteady Patient leans: N/A  Psychiatric Specialty Exam: Physical Exam Vitals and nursing note reviewed.  HENT:     Head: Normocephalic.  Pulmonary:     Effort: Pulmonary effort is normal.  Neurological:     General: No focal deficit present.     Mental Status: She is alert and oriented  to person, place, and time.     Review of Systems  Blood pressure (!) 107/56, pulse 64, temperature 97.7 F (36.5 C), temperature source Oral, resp. rate 16, height 5' 3"  (1.6 m), weight 46.5 kg.Body mass index is 18.16 kg/m.  General Appearance: Disheveled  Eye Contact:  Minimal  Speech:  Normal Rate  Volume:  Decreased  Mood:  Depressed   Affect:  Congruent  Thought Process:  Goal Directed and Descriptions of Associations: Circumstantial  Orientation:  Full (Time, Place, and Person)  Thought Content:  Negative  Suicidal Thoughts:  Yes.  without intent/plan  Homicidal Thoughts:  No  Memory:  Immediate;   Poor Recent;   Poor Remote;   Poor  Judgement:  Impaired  Insight:  Lacking  Psychomotor Activity:  Decreased  Concentration:  Concentration: Fair and Attention Span: Fair  Recall:  Poor  Fund of Knowledge:  Fair  Language:  Fair  Akathisia:  Negative  Handed:  Right  AIMS (if indicated):     Assets:  Desire for Improvement Resilience  ADL's:  Impaired  Cognition:  Impaired,  Mild  Sleep:         COGNITIVE FEATURES THAT CONTRIBUTE TO RISK:  None    SUICIDE RISK:   Minimal: No identifiable suicidal ideation.  Patients presenting with no risk factors but with morbid ruminations; may be classified as minimal risk based on the severity of the depressive symptoms  PLAN OF CARE: Patient is seen and examined.  Patient is a 54 year old female with the above-stated past medical and psychiatric history who was admitted secondary to suicidal ideation.  She will be admitted to the hospital.  She will be integrated in the milieu.  She will be encouraged to attend groups.  We will restart her Abilify at 2 mg p.o. daily.  We will also restart the mirtazapine at 7.5 mg p.o. nightly.  We will write for gabapentin 100 mg p.o. 3 times daily and titrate that during the course of the hospitalization for anxiety.  She is chronically and severely physically ill.  I'm not sure that whoever her boyfriend is that she'll be able to return to the home currently, but she stated that the home is hers.  On admission she is hyponatremic, but on the diuretics for her ascites.  We will monitor her sodium especially given her history of encephalopathy.  Her blood sugar this morning was 449.  I suspect noncompliance with her diabetic medications given  the fact that she has had DKA in the past.  That really should've been addressed in the emergency room prior to admission.  Her liver function enzymes are within normal limits, but her platelet count is only 63,000.  Her PT and INR are both elevated, so her synthetic function is most likely decrease.  Additionally her albumin was only 2.6.  You can tell from her photographs on the electronic medical record that she has lost a significant amount of weight over time.  She is mildly anemic with a hemoglobin 9.1 and hematocrit of 27.7.  She is on supplemental iron, but that'll have to be used with caution given her cirrhosis.  Her acetaminophen was less than 10, salicylate was less than 7.  Urinalysis showed greater than 500 mg per DL of glucose.  She had greater than 50 white blood cells in her urine, and 0-5 squamous epithelial cells.  Her urine culture showed multiple species present and suggested 3 collection.  She is reportedly allergic to cephalexin, ciprofloxacin, sulfamethoxazole, trimethoprim.  We will start her on ampicillin after the collection of the urine is done.  She does have yeast present, and we will give her Diflucan for that.  With her elevated blood sugar we will put her on a sliding scale initially but this will most likely have to be used with caution given her weight.  We will also restart her fluoxetine at 10 mg p.o. daily.  She does have a history of hypothyroidism, and of course the TSH was not checked in the emergency department.  We will check that to make sure that that is not influencing her psychiatric or physical condition at this time.  There are no TSH is in the chart from at least 2020 on.  Because of her cirrhosis her lactulose will be continued.  She has a history of chronic pancreatitis we will continue her Creon as previously written.  We will also get physical therapy consult during the course of the hospitalization.  Her blood pressure stable, she is afebrile.  Review of the  electronic medical record showed that she was previously taking Aldactone 50 mg p.o. daily, but I am unable to find that that is active.  Given her cirrhosis and small volume ascites I will go on and write that for now until we monitor her volume state.  We will also write for daily weights.  She will also be placed on Protonix as well as folic acid and thiamine.  We will also get a nutrition consult.  I certify that inpatient services furnished can reasonably be expected to improve the patient's condition.   Sharma Covert, MD 10/04/2019, 1:10 PM

## 2019-10-04 NOTE — ED Provider Notes (Signed)
Accepted with bed at behavioral health.  Did become transiently hypotensive after receiving morning sedation medications for severe anxiety attack, but BP stabalized and patient was awake, oriented and sipping on gatorade.    [name], MD 10/04/19 [id number]

## 2019-10-04 NOTE — Progress Notes (Signed)
   10/04/19 2000  COVID-19 Daily Checkoff  Have you had a fever (temp > 37.80C/100F)  in the past 24 hours?  No  COVID-19 EXPOSURE  Have you traveled outside the state in the past 14 days? No  Have you been in contact with someone with a confirmed diagnosis of COVID-19 or PUI in the past 14 days without wearing appropriate PPE? No  Have you been living in the same home as a person with confirmed diagnosis of COVID-19 or a PUI (household contact)? No  Have you been diagnosed with COVID-19? No

## 2019-10-04 NOTE — Progress Notes (Signed)
Pt A & O to self, place and situation. Denies SI, HI, AVH and pain when assessed. Presents drowsy, fatigued with unsteady gait on arrival to [facility] but cooperative with admission assessment. Pt endorsed being depressed, anxious with panic attacks "because I'm very sick and I'm not getting any better, so I called 911" when asked about event leading to admission. Reports poor sleep I only get about 2 hours of sleep most nights at home, my appetite is very poor, I have lost so much weight and my doctor wants me on a regular diet to try to gain some weight back." States she had to let her friend stay with her at her home "because I need some help at home and she needed a place". States she's been divorced from her husband Lebeck) five years ago but does have a boyfriend who is supportive of her. Pt medical history includes diabetes, chronic cirrhosis from multiple Tylenol overdose "when I was younger, I did it to myself and I drank alcohol too; now I have liver problems too".  States she's been on disability for years since the 90s due to her anxiety, depression and panic attacks.  Pt is cooperative with assessment. Skin and belongings searched done per protocol. Pt's skin is intact with tattoos noted on upper and lower back and left ankle. Red shorts with strings secured in locker. Pt ambulatory to unit with writer's assistance due to unsteady gait. Unit orientation done, routines discussed and care plan reviewed with pt; understanding verbalized. Q 15 minutes safety checks initiated without self harm gestures or outburst thus far.

## 2019-10-04 NOTE — ED Notes (Signed)
Dameo, boyfriend, [phone] would like an update when available

## 2019-10-04 NOTE — Evaluation (Signed)
Physical Therapy Evaluation Patient Details Name: [name] MRN: [id number] DOB: 05-22-1965 Today's Date: 10/04/2019   History of Present Illness  pt with multiple hosptial and ED visits with major depressive episode with SI  and uncontrolled DM with high sugar levels.  Clinical Impression  Pt very pleasant to work with , noted a lot of atrophy and generalized weakness as patient explained number hospitalizations . Pt had improved gait and steadiness with RW and MD approved pt to have a RW while on the unit at [facility]. Pt sated she had just started HHPT and [place] ( with aide and RN as well) at home with [facility], I recommend these services continue once she discharges home. Pt would benefit from these for continued strengthening, safety in home, and balance.     Follow Up Recommendations Home health PT (pt had already acitve eith [facility] HHPT and HHOT , need to cotninue with these sevices once DC home)    Equipment Recommendations  None recommended by PT    Recommendations for Other Services       Precautions / Restrictions Precautions Precautions: None      Mobility  Bed Mobility Overal bed mobility: Independent                Transfers Overall transfer level: Independent                  Ambulation/Gait Ambulation/Gait assistance: Min guard Gait Distance (Feet): 50 Feet (but then supervision with RW once I gave her one) Assistive device: None;Rolling walker (2 wheeled)       General Gait Details: walked with RW and without, she is definietely more sterdy adn secure with RW veruss no RW. MD agreed to allow pt to have a RW in her room  Stairs            Wheelchair Mobility    Modified Rankin (Stroke Patients Only)       Balance                                             Pertinent Vitals/Pain      Home Living Family/patient expects to be discharged to:: Private residence Living Arrangements:  (pt states  boyfriend) Available Help at Discharge: Friend(s) Type of Home: [place]: One [place]: Environmental consultant - 2 wheels;Walker - 4 wheels;Cane - single point;Hospital bed;Bedside commode Additional Comments: Pt states she has oxygen as well, but I don't see a need for oxygen    Prior Function Level of Independence: Independent with assistive device(s)         Comments: pt states she just left the hosptial and uses a RW to get around , had just started wtih HHPT, HHOT, RN ,  and aide this week with [facility]     Hand Dominance        Extremity/Trunk Assessment        Lower Extremity Assessment Lower Extremity Assessment: Generalized weakness       Communication   Communication: No difficulties  Cognition Arousal/Alertness: Awake/alert Behavior During Therapy: WFL for tasks assessed/performed Overall Cognitive Status: Within Functional Limits for tasks assessed  General Comments      Exercises     Assessment/Plan    PT Assessment All further PT needs can be met in the next venue of care (with [place] services for continued safety , strengthening and balance training)  PT Problem List         PT Treatment Interventions      PT Goals (Current goals can be found in the Care Plan section)  Acute Rehab PT Goals Patient Stated Goal: I want to get stronger PT Goal Formulation: All assessment and education complete, DC therapy Potential to Achieve Goals: Good    Frequency     Barriers to discharge        Co-evaluation               AM-PAC PT "6 Clicks" Mobility  Outcome Measure Help needed turning from your back to your side while in a flat bed without using bedrails?: None Help needed moving from lying on your back to sitting on the side of a flat bed without using bedrails?: None Help needed moving to and from a bed to a chair (including a wheelchair)?:  None Help needed standing up from a chair using your arms (e.g., wheelchair or bedside chair)?: None Help needed to walk in hospital room?: A Little Help needed climbing 3-5 steps with a railing? : A Little 6 Click Score: 22    End of Session   Activity Tolerance: Patient tolerated treatment well Patient left:  (at nurses desk , standing) Nurse Communication: Mobility status      Time: 2072-1828 PT Time Calculation (min) (ACUTE ONLY): 25 min   Charges:   PT Evaluation $PT Eval Low Complexity: 1 Low          Jahmeer Porche, PT, MPT Acute Rehabilitation Services Office: [phone] Pager: [phone] 10/04/2019   Clide Dales 10/04/2019, 7:27 PM

## 2019-10-04 NOTE — BHH Counselor (Signed)
Clinician called TTS cart however cannot connect call, error message was received. Clinician spoke to Alta, RN, clinicain was connected to pt's room.     Vertell Novak, MS, [facility], [facility] Triage Specialist [phone].

## 2019-10-04 NOTE — Tx Team (Signed)
Initial Treatment Plan 10/04/2019 4:38 PM [name] [id number]    PATIENT STRESSORS: Health problems Other: Assistance and support at home (cooking, meals)   PATIENT STRENGTHS: Communication skills Motivation for treatment/growth Supportive family/friends   PATIENT IDENTIFIED PROBLEMS: Alterations in mood "I am depressed, stressed out, I just had a panic attack over at [facility] and I'm sick."    Alterations in sleep pattern "I have not been sleeping well lately, most times I get 2 hours of sleep at night."    Alterations in appetite "I have lost so much weight and my doctor wants me on a regular diet to gain some weight."             DISCHARGE CRITERIA:  Improved stabilization in mood, thinking, and/or behavior Verbal commitment to aftercare and medication compliance  PRELIMINARY DISCHARGE PLAN: Outpatient therapy Return to previous living arrangement  PATIENT/FAMILY INVOLVEMENT: This treatment plan has been presented to and reviewed with the patient, [name]. The patient have been given the opportunity to ask questions and make suggestions.  Keane Police, RN 10/04/2019, 4:38 PM
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
## 2019-10-05 DIAGNOSIS — F323 Major depressive disorder, single episode, severe with psychotic features: Secondary | ICD-10-CM | POA: Diagnosis not present

## 2019-10-05 LAB — GLUCOSE, CAPILLARY
Glucose-Capillary: 207 mg/dL — ABNORMAL HIGH (ref 70–99)
Glucose-Capillary: 127 mg/dL — ABNORMAL HIGH (ref 70–99)
Glucose-Capillary: 215 mg/dL — ABNORMAL HIGH (ref 70–99)
Glucose-Capillary: 156 mg/dL — ABNORMAL HIGH (ref 70–99)
Glucose-Capillary: 250 mg/dL — ABNORMAL HIGH (ref 70–99)

## 2019-10-05 LAB — AMMONIA: Ammonia: 75 umol/L — ABNORMAL HIGH (ref 9–35)

## 2019-10-05 MED ORDER — ONDANSETRON HCL 4 MG PO TABS
4.0000 mg | ORAL_TABLET | Freq: Four times a day (QID) | ORAL | Status: DC | PRN
  Administered 2019-10-05 – 2019-10-10 (×6): 4 mg via ORAL
  Filled 2019-10-05 (×6): qty 1

## 2019-10-05 MED ORDER — INSULIN GLARGINE 100 UNIT/ML ~~LOC~~ SOLN
20.0000 [IU] | Freq: Every day | SUBCUTANEOUS | Status: DC
  Administered 2019-10-06: 20 [IU] via SUBCUTANEOUS

## 2019-10-05 MED ORDER — GLUCERNA SHAKE PO LIQD
237.0000 mL | Freq: Three times a day (TID) | ORAL | Status: DC
  Administered 2019-10-05 – 2019-10-10 (×13): 237 mL via ORAL

## 2019-10-05 MED ORDER — INSULIN ASPART 100 UNIT/ML ~~LOC~~ SOLN
8.0000 [IU] | Freq: Three times a day (TID) | SUBCUTANEOUS | Status: DC
  Administered 2019-10-05 – 2019-10-06 (×2): 8 [IU] via SUBCUTANEOUS

## 2019-10-05 NOTE — Progress Notes (Signed)
Pt did not attend wrap up group.

## 2019-10-05 NOTE — BHH Group Notes (Signed)
[facility] LCSW Group Therapy Note  Date/Time:    10/05/2019 10:00-11:00AM  Type of Therapy and Topic:  Group Therapy:  Healthy vs Unhealthy Coping Skills  Participation Level:  Active   Description of Group:  The focus of this group was to determine what unhealthy coping techniques typically are used by group members and what healthy coping techniques would be helpful in coping with various problems. Patients were guided in becoming aware of the differences between healthy and unhealthy coping techniques.  Patients were asked to identify 1-2 healthy coping skills they would like to learn to use more effectively, and many mentioned meditation, breathing, and relaxation.  At the end of group, additional ideas of healthy coping skills were shared in a fun exercise.  Therapeutic Goals Patients learned that coping is what human beings do all day long to deal with various situations in their lives Patients defined and discussed healthy vs unhealthy coping techniques Patients identified their preferred coping techniques and identified whether these were healthy or unhealthy Patients determined 1-2 healthy coping skills they would like to become more familiar with and use more often Patients provided support and ideas to each other  Summary of Patient Progress: During group, patient expressed that her healthy coping skills include going to church, reading her Bible, listening to music, and loving on animals.  Unhealthy coping mechanisms include isolation, begging God to allow her to die, and being angry at her doctors.  We discussed the fact that anger is not necessarily bad in and of itself, and whether it is healthy or unhealthy for [place] depends on how we react.  She talked about her doctors "giving up" on her and how she often feels treated when she goes to the ED and has to wait for hours while others are seen immediately.  She was calm and soft-spoken as she spoke, made insightful comments, was encouraged to  be honest with her doctors.  She left group after about 45 minutes, did not return.   Therapeutic Modalities Cognitive Behavioral Therapy Motivational Interviewing   Selmer Dominion, LCSW 10/05/2019, 9:21 AM

## 2019-10-05 NOTE — Progress Notes (Signed)
Close Observation note  D.  Pt sitting in dayroom, no acute distress noted.    A.  Close observation continued for patient safety  R.  Patient remains safe on unit

## 2019-10-05 NOTE — Progress Notes (Signed)
   10/05/19 2343  COVID-19 Daily Checkoff  Have you had a fever (temp > 37.80C/100F)  in the past 24 hours?  No  If you have had runny nose, nasal congestion, sneezing in the past 24 hours, has it worsened? No  COVID-19 EXPOSURE  Have you traveled outside the state in the past 14 days? No  Have you been in contact with someone with a confirmed diagnosis of COVID-19 or PUI in the past 14 days without wearing appropriate PPE? No  Have you been living in the same home as a person with confirmed diagnosis of COVID-19 or a PUI (household contact)? No  Have you been diagnosed with COVID-19? No

## 2019-10-05 NOTE — Progress Notes (Signed)
[facility] MD Progress Note  10/05/2019 3:21 PM MACEY WURTZ  MRN:  [id number]  Subjective: Lula reports, "My mood is a bit better. I'm just feeling nauseated. My stomach is upset. I don't have an appetite. I did not eat breakfast, I don't like what they served this morning. I'm kind of picky".  Objective: Patient is a 54 year old female with an unspecified past psychiatric history who originally presented to the [facility] emergency department after her boyfriend dropped her off.  The patient stated that she was not sleeping well, she was depressed, having anxiety and panic attacks.  She stated she was upset over the fact that she was dying.  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  She also has a history of alcoholic liver disease.  The patient stated that she did not drink the past, but overdosed on Tylenol multiple times between teenage years to when she was in her 67s or 33s.  She also has a history of cirrhosis with portal hypertension, small volume abdominal ascites and previous admissions for hepatic encephalopathy.  She also has a history of diabetes.  She stated that she had last seen a psychiatrist a couple years ago.  She was seen in either [facility] or [facility] but did not like them.  Cedric is seen, chart reviewed. The chart findings discussed with the treatment team. She present alert, but presents weak & frail. She says she feels nauseated, refused breakfast this morning. Has not been able to take much of her medications as a result. Changed her Zofran 4 mg po to Q 6 hrs prn for N/V. She is ordered some Glucerna tid in between meals. She remains on 1:1 supervision for safety. She uses walker to aid her mobility. Reviewed labs. New lab orders pending to be drawn. Patient denies any SIHI, AVH, delusional thoughts or paranoia.She does not appear to be responding to any internal stimuli.  Principal Problem: Severe recurrent major depressive disorder  with psychotic features ([place])  Diagnosis: Principal Problem:   Severe recurrent major depressive disorder with psychotic features ([place]) Active Problems:   Major depressive disorder, single episode, severe with psychotic features ([place])   Depression  Total Time spent with patient: 25 minutes  Past Psychiatric History: See H&P  Past Medical History:  Past Medical History:  Diagnosis Date   Degenerative joint disease    Diabetes mellitus without complication ([place])    Gastroesophageal reflux    Pancreatitis    Panic attack    Sleep apnea    Thrombocytopenia ([place])     Past Surgical History:  Procedure Laterality Date   CHOLECYSTECTOMY     SHOULDER SURGERY     left    tongue sx. titanium screw placed to hold tongue down     TOTAL THYROIDECTOMY     Family History: History reviewed. No pertinent family history.   Family Psychiatric  History: See H&P  Social History:  Social History   Substance and Sexual Activity  Alcohol Use No     Social History   Substance and Sexual Activity  Drug Use No    Social History   Socioeconomic History   Marital status: Single    Spouse name: Not on file   Number of children: Not on file   Years of education: Not on file   Highest education level: Not on file  Occupational History   Not on file  Tobacco Use   Smoking status: Never Smoker   Smokeless  tobacco: Never Used  Vaping Use   Vaping Use: Never used  Substance and Sexual Activity   Alcohol use: No   Drug use: No   Sexual activity: Not Currently  Other Topics Concern   Not on file  Social History Narrative   Not on file   Social Determinants of Health   Financial Resource Strain:    Difficulty of Paying Living Expenses:   Food Insecurity:    Worried About Charity fundraiser in the Last Year:    Arboriculturist in the Last Year:   Transportation Needs:    Film/video editor (Medical):    Lack of Transportation (Non-Medical):    Physical Activity:    Days of Exercise per Week:    Minutes of Exercise per Session:   Stress:    Feeling of Stress :   Social Connections:    Frequency of Communication with Friends and Family:    Frequency of Social Gatherings with Friends and Family:    Attends Religious Services:    Active Member of Clubs or Organizations:    Attends Archivist Meetings:    Marital Status:    Additional Social History:   Sleep: Good  Appetite:  Poor  Current Medications: Current Facility-Administered Medications  Medication Dose Route Frequency Provider Last Rate Last Admin   albuterol (VENTOLIN HFA) 108 (90 Base) MCG/ACT inhaler 1-2 puff  1-2 puff Inhalation Q6H PRN Sharma Covert, MD       alum & mag hydroxide-simeth (MAALOX/MYLANTA) 200-200-20 MG/5ML suspension 30 mL  30 mL Oral Q4H PRN Sharma Covert, MD       amoxicillin (AMOXIL) capsule 250 mg  250 mg Oral Q8H Sharma Covert, MD   250 mg at 10/05/19 1445   ARIPiprazole (ABILIFY) tablet 2 mg  2 mg Oral Daily Sharma Covert, MD   2 mg at 10/05/19 1000   empagliflozin (JARDIANCE) tablet 25 mg  25 mg Oral Daily Sharma Covert, MD   25 mg at 10/05/19 1001   feeding supplement (GLUCERNA SHAKE) (GLUCERNA SHAKE) liquid 237 mL  237 mL Oral TID BM Kanitra Purifoy I, NP       fluconazole (DIFLUCAN) tablet 100 mg  100 mg Oral Daily Sharma Covert, MD   100 mg at 10/05/19 1001   FLUoxetine (PROZAC) capsule 10 mg  10 mg Oral Daily Lindell Spar I, NP   10 mg at 49/70/26 3785   folic acid (FOLVITE) tablet 1 mg  1 mg Oral Daily Sharma Covert, MD   1 mg at 10/05/19 1001   gabapentin (NEURONTIN) capsule 100 mg  100 mg Oral TID Lindell Spar I, NP   100 mg at 10/05/19 1001   hydrOXYzine (ATARAX/VISTARIL) tablet 25 mg  25 mg Oral TID PRN Sharma Covert, MD       insulin aspart (novoLOG) injection 0-15 Units  0-15 Units Subcutaneous TID WC Sharma Covert, MD   5 Units at 10/05/19 1218   insulin  aspart (novoLOG) injection 8 Units  8 Units Subcutaneous TID WC Sharma Covert, MD       [START ON 10/06/2019] insulin glargine (LANTUS) injection 20 Units  20 Units Subcutaneous Daily Sharma Covert, MD       ipratropium-albuterol (DUONEB) 0.5-2.5 (3) MG/3ML nebulizer solution 3 mL  3 mL Nebulization Q4H PRN Sharma Covert, MD       lactulose (CHRONULAC) 10 GM/15ML solution 20 g  20 g Oral  BID Sharma Covert, MD   20 g at 10/04/19 1727   levothyroxine (SYNTHROID) tablet 50 mcg  50 mcg Oral Q0600 Sharma Covert, MD   50 mcg at 10/05/19 8127   lipase/protease/amylase (CREON) capsule 24,000 Units  24,000 Units Oral TID AC Sharma Covert, MD   24,000 Units at 10/05/19 5170   magnesium hydroxide (MILK OF MAGNESIA) suspension 30 mL  30 mL Oral Daily PRN Sharma Covert, MD       megestrol (MEGACE) tablet 40 mg  40 mg Oral Daily Sharma Covert, MD   40 mg at 10/05/19 1000   mirtazapine (REMERON) tablet 7.5 mg  7.5 mg Oral QHS Brevyn Ring, Herbert Pun I, NP   7.5 mg at 10/04/19 2141   ondansetron (ZOFRAN) tablet 4 mg  4 mg Oral Q6H PRN Lindell Spar I, NP   4 mg at 10/05/19 1445   pantoprazole (PROTONIX) EC tablet 40 mg  40 mg Oral Daily Sharma Covert, MD   40 mg at 10/05/19 1001   spironolactone (ALDACTONE) tablet 12.5 mg  12.5 mg Oral Daily Sharma Covert, MD   12.5 mg at 10/05/19 0959   tiZANidine (ZANAFLEX) tablet 4 mg  4 mg Oral Q8H PRN Sharma Covert, MD       traZODone (DESYREL) tablet 50 mg  50 mg Oral QHS PRN Sharma Covert, MD       Vitamin D (Ergocalciferol) (DRISDOL) capsule 50,000 Units  50,000 Units Oral Q Esmond Camper, MD   50,000 Units at 10/04/19 1236   Lab Results:  Results for orders placed or performed during the hospital encounter of 10/04/19 (from the past 48 hour(s))  Glucose, capillary     Status: Abnormal   Collection Time: 10/04/19 11:46 AM  Result Value Ref Range   Glucose-Capillary 449 (H) 70 - 99 mg/dL    Comment:  Glucose reference range applies only to samples taken after fasting for at least 8 hours.  Glucose, capillary     Status: Abnormal   Collection Time: 10/04/19  2:10 PM  Result Value Ref Range   Glucose-Capillary 298 (H) 70 - 99 mg/dL    Comment: Glucose reference range applies only to samples taken after fasting for at least 8 hours.  Glucose, capillary     Status: Abnormal   Collection Time: 10/04/19  4:29 PM  Result Value Ref Range   Glucose-Capillary 190 (H) 70 - 99 mg/dL    Comment: Glucose reference range applies only to samples taken after fasting for at least 8 hours.  Urinalysis, Complete w Microscopic     Status: Abnormal   Collection Time: 10/04/19  6:02 PM  Result Value Ref Range   Color, Urine YELLOW YELLOW   APPearance HAZY (A) CLEAR   Specific Gravity, Urine 1.026 1.005 - 1.030   pH 6.0 5.0 - 8.0   Glucose, UA >=500 (A) NEGATIVE mg/dL   Hgb urine dipstick LARGE (A) NEGATIVE   Bilirubin Urine NEGATIVE NEGATIVE   Ketones, ur NEGATIVE NEGATIVE mg/dL   Protein, ur NEGATIVE NEGATIVE mg/dL   Nitrite NEGATIVE NEGATIVE   Leukocytes,Ua TRACE (A) NEGATIVE   RBC / HPF 11-20 0 - 5 RBC/hpf   WBC, UA 21-50 0 - 5 WBC/hpf   Bacteria, UA RARE (A) NONE SEEN   Squamous Epithelial / LPF 0-5 0 - 5   Budding Yeast PRESENT     Comment: Performed at [facility], [place] [address] [place], [place] [address]  TSH     Status: None   Collection Time: 10/04/19  6:11 PM  Result Value Ref Range   TSH 0.522 0.350 - 4.500 uIU/mL    Comment: Performed by a 3rd Generation assay with a functional sensitivity of <=0.01 uIU/mL. Performed at [facility], [place] [address] [place], [place] [address]   Hemoglobin A1c     Status: Abnormal   Collection Time: 10/04/19  6:11 PM  Result Value Ref Range   Hgb A1c MFr Bld 7.4 (H) 4.8 - 5.6 %    Comment: (NOTE) Pre diabetes:          5.7%-6.4%  Diabetes:              >6.4%  Glycemic control for   <7.0% adults with  diabetes    Mean Plasma Glucose 165.68 mg/dL    Comment: Performed at [facility] [address] [place], [place] [address]  Glucose, capillary     Status: Abnormal   Collection Time: 10/04/19 10:17 PM  Result Value Ref Range   Glucose-Capillary 265 (H) 70 - 99 mg/dL    Comment: Glucose reference range applies only to samples taken after fasting for at least 8 hours.   Comment 1 Notify RN    Comment 2 Document in Chart   Glucose, capillary     Status: Abnormal   Collection Time: 10/05/19  1:12 AM  Result Value Ref Range   Glucose-Capillary 207 (H) 70 - 99 mg/dL    Comment: Glucose reference range applies only to samples taken after fasting for at least 8 hours.   Comment 1 Notify RN    Comment 2 Document in Chart   Glucose, capillary     Status: Abnormal   Collection Time: 10/05/19  6:10 AM  Result Value Ref Range   Glucose-Capillary 127 (H) 70 - 99 mg/dL    Comment: Glucose reference range applies only to samples taken after fasting for at least 8 hours.   Comment 1 Notify RN    Comment 2 Document in Chart   Glucose, capillary     Status: Abnormal   Collection Time: 10/05/19 11:54 AM  Result Value Ref Range   Glucose-Capillary 215 (H) 70 - 99 mg/dL    Comment: Glucose reference range applies only to samples taken after fasting for at least 8 hours.   Blood Alcohol level:  Lab Results  Component Value Date   ETH <10 37/62/8315   Metabolic Disorder Labs: Lab Results  Component Value Date   HGBA1C 7.4 (H) 10/04/2019   MPG 165.68 10/04/2019   MPG 211.6 01/26/2019   No results found for: PROLACTIN No results found for: CHOL, TRIG, HDL, CHOLHDL, VLDL, LDLCALC  Physical Findings: AIMS:  , ,  ,  ,    CIWA:    COWS:     Musculoskeletal: Strength & Muscle Tone: within normal limits Gait & Station: normal Patient leans: N/A  Psychiatric Specialty Exam: Physical Exam Vitals and nursing note reviewed.  HENT:     Head: Normocephalic.     Nose: Nose normal.      Mouth/Throat:     Pharynx: Oropharynx is clear.  Eyes:     Pupils: Pupils are equal, round, and reactive to light.  Cardiovascular:     Pulses: Normal pulses.  Pulmonary:     Effort: Pulmonary effort is normal.  Genitourinary:    Comments: Deferred Musculoskeletal:        General: Normal range of motion.  Cervical back: Normal range of motion.  Skin:    General: Skin is warm and dry.  Neurological:     Mental Status: She is alert and oriented to person, place, and time.     Review of Systems  Constitutional: Negative for chills, diaphoresis and fever.  HENT: Negative for congestion, rhinorrhea, sneezing and sore throat.   Eyes: Negative for discharge.  Respiratory: Negative for chest tightness, shortness of breath and wheezing.   Cardiovascular: Negative for chest pain and palpitations.  Gastrointestinal: Negative for diarrhea, nausea and vomiting.  Endocrine: Negative for cold intolerance.  Genitourinary: Negative for difficulty urinating.  Musculoskeletal: Negative for arthralgias.       Guest presents with weak gait/balance. Currently ambulating with walker & assist.  Skin: Negative.   Allergic/Immunologic:       Allergies: Acetaminophen   Cephalexin   Ciprofloxacin Hydromorphone Sulfamethoxazole  Trimethoprim Trimethoprim Dilaudid (Hydromorphone  Neurological: Positive for dizziness and weakness (Generalized).  Psychiatric/Behavioral: Positive for dysphoric mood. Negative for agitation, behavioral problems, confusion, decreased concentration, hallucinations, self-injury, sleep disturbance and suicidal ideas. The patient is nervous/anxious. The patient is not hyperactive.     Blood pressure (!) 105/50, pulse 81, temperature 98.2 F (36.8 C), temperature source Oral, resp. rate 18, height 5' 3"  (1.6 m), weight 46.3 kg, SpO2 99 %.Body mass index is 18.07 kg/m.  General Appearance: Disheveled  Eye Contact:  Minimal  Speech:  Normal Rate  Volume:  Decreased  Mood:   Depressed  Affect:  Congruent  Thought Process:  Goal Directed and Descriptions of Associations: Circumstantial  Orientation:  Full (Time, Place, and Person)  Thought Content:  Negative  Suicidal Thoughts:  Yes.  without intent/plan  Homicidal Thoughts:  No  Memory:  Immediate;   Poor Recent;   Poor Remote;   Poor  Judgement:  Impaired  Insight:  Lacking  Psychomotor Activity:  Decreased  Concentration:  Concentration: Fair and Attention Span: Fair  Recall:  Poor  Fund of Knowledge:  Fair  Language:  Fair  Akathisia:  Negative  Handed:  Right  AIMS (if indicated):     Assets:  Desire for Improvement Resilience  ADL's:  Impaired  Cognition:  Impaired,    Sleep:  Number of Hours: 6.5   Treatment Plan Summary: Daily contact with patient to assess and evaluate symptoms and progress in treatment and Medication management.  - Continue inpatient hospitalization. - Will continue today 10/05/2019 plan as below except where it is noted.  Mood control/adjunct to antidepressant.     - Continue Abilify 2 mg po daily.  Depression/anxiety    - Continue Prozac 10 mg po daily.    - Continue Vistaril 25 mg po tid prn.  Agitation/diabetic neuropathy.    - Continue gabapentin 100 mg po tid.  Insomnia.    - Continue Mirtazapine 7.5 mg po Q hs.  Other medical issues.    - Continue Amoxil 250 mg po tid for infection.    - Continue Jardiance 25 mg po po daily for diabetes management.     - Continue albuterol inhaler 1-2 puffs Q 6 hrs prn for SOB.     - Continue Diflucan 100 mg po daily for infection.     - Continue Folic acid 1 mg po daily for nutritional supplement.     - Continue sliding scale insulin coverage with Novolog per BS results.     - Continue Novolog insulin 8 units tif for meal coverage.     - Continue Lantus insulin 20  units Sub Q daily for DM management.     - Continue Duoneb 3 ml per inhalation Q 4 hrs prn for SOB.     -  Continue Chronulac 20 gm po bid for increased  ammonium level.     - Continue synThyroid 50 mcg po daily for hypothyroidism.     - Continue Creon 24,000 units tid for pancreatitis.     - Continue Megace 40 mg po daily for nutritional supplement.     - Continue Zofran 4 mg po Q 4 hrs for n/v.     - Continue Protonix 40 mg po for GERD.     - Continue Aldactone 12.5 mg po daily for HTN.     - Continue Zanaflex 4 mg po Q 8 hrs prn for muscle spasms.     - Continue Vit D 50, 000 units daily for bone health.  Continue 1:1 supervision due to generalized weakness. Patient uses the walker to aid mobility. Encourage group participation. Discharge disposition in progress. Continue 1:1 supervision for safety.   Lindell Spar, NP, PMHNP, FNP-BC 10/05/2019, 3:21 PM

## 2019-10-05 NOTE — Progress Notes (Signed)
Close Observation Note  D.  Pt retired to bedroom for the night, took HS medications.  No complaints voiced.  A.  Close observation continued for Pt safety  R.  Pt remains safe on unit.

## 2019-10-05 NOTE — Progress Notes (Signed)
Close Observation Note:  Pt in bed resting.  Pt appears to be in no distress.  CO remains because of pt's high fall risk, and risk of bleeding.  RN will continue to monitor and provide assistance as needed.

## 2019-10-05 NOTE — Progress Notes (Signed)
NP, Lindon Romp was notified of pt's CBG reading of 265 at 2217. NP ordered a one time dose of 2 units of Novolog insulin. Pt's insulin was administered at 2323 and CBG was rechecked at 0112 and was 207. NP, Lindon Romp was notified. No new orders at this time.

## 2019-10-05 NOTE — Progress Notes (Signed)
Close Observation Note:  Pt continues on close observation while awake for safety due to high fall risk and risk for bleeding. Pt is currently resting in bed comfortably with her eyes closed. Respirations are even and unlabored. No distress has been observed. Q 15 min safety checks also continue. Pt remains safe on the unit at this time.

## 2019-10-05 NOTE — Progress Notes (Signed)
Pt up and out of bed to nurse's station to take her medications at breakfast and lunch time.  Pt also attended LCSW group.  Pt denies SI/HI/AVH.  Pt continues to say that she is depressed and she presents with flat affect.  Pt unable to take lactulose and other medicines because pt has been nauseated.  RN administered Zofran.  Pt is resting at this time.

## 2019-10-05 NOTE — Progress Notes (Signed)
   10/05/19 2346  Psych Admission Type (Psych Patients Only)  Admission Status Voluntary  Psychosocial Assessment  Patient Complaints Depression  Eye Contact Brief  Facial Expression Flat  Affect Depressed;Appropriate to circumstance  Speech Logical/coherent  Interaction Assertive  Motor Activity Slow;Unsteady (utilzing walker, sitter in place)  Appearance/Hygiene In scrubs  Behavior Characteristics Appropriate to situation  Mood Depressed  Thought Process  Coherency WDL  Content WDL  Delusions None reported or observed  Perception WDL  Hallucination None reported or observed  Judgment Poor  Confusion None  Danger to Self  Current suicidal ideation? Denies  Self-Injurious Behavior No self-injurious ideation or behavior indicators observed or expressed   Agreement Not to Harm Self Yes  Description of Agreement verbally contracts for safety  Danger to Others  Danger to Others None reported or observed

## 2019-10-05 NOTE — [facility] Group Notes (Signed)
Adult Psychoeducational Group Note  Date:  10/05/2019 Time:  9:35 PM  Group Topic/Focus:  Wrap-Up Group:   The focus of this group is to help patients review their daily goal of treatment and discuss progress on daily workbooks.  Participation Level:  Active  Participation Quality:  Appropriate and Attentive  Affect:  Appropriate  Cognitive:  Alert and Appropriate  Insight: Appropriate and Good  Engagement in Group:  Engaged  Modes of Intervention:  Discussion and Education  Additional Comments:  Pt attended and participated in wrap up group this evening and rated their day a 6/10, due to them getting out of the bed, and talking with their peers helped. Pt completed their goal, which was to come out of their room and not to sleep as much.    [name] 10/05/2019, 9:35 PM

## 2019-10-05 NOTE — Progress Notes (Signed)
Pt has taken f her morning medications (except Lactulose - waiting to get Lactulose from pharmacy).  MHT continues to be within a few feet of pt to monitor pt and provide pt with support.  No concerns are identified at this time.

## 2019-10-05 NOTE — Progress Notes (Signed)
Close Observation Note:  Pt started on close observation while awake for safety due to high fall risk and risk for bleeding. Pt has been educated to alert staff know if she has to get up tonight to use the restroom or to use her call light. Pt currently has her yellow non-skid socks on and her fall risk bracelet. Pt will continue to use her walker and a gait belt with X 1 person assistance for ambulation. She verbally demonstrates understanding through teach-back and has been compliant with the measures thus far. Pt is currently resting in bed comfortably at this time. She denies feeling dizzy, light-headed, or faint and has been educated to alert staff immediately if she does. Her respirations are even and unlabored. No distress has been noted. Q 15 min safety checks continue. Pt remains safe on the unit at this time.

## 2019-10-05 NOTE — Progress Notes (Signed)
Pt reports that she hadn't slept in 4 days, so earlier this morning she was administered medications to help her sleep and she has been resting since. Pt endorses increased depression from her medical conditions and becoming dependent on others for help. She has been hypotensive and po fluids have been encouraged. Pt was provided 240 mLs of gatorade because she said she has a hard time drinking water and was feeling faint. She also said that she hadn't ate dinner so she was provided a salad from the cafeteria. Pt educated to wear yellow non-skid socks always, changing positions slowly, dangling before getting out of bed, and asking staff for assistance with ambulation. She also c/o nausea and pain in her abdomen for which her PRN med's were administered. Pt was provided another blanket upon request and heating pads. Pt is passively suicidal without a plan and verbally contracts for safety. Active listening, reassurance, and support provided. Denies SI/HI and AVH. Q 15 min safety checks continue. Pt's safety has been maintained.    10/04/19 2300  Psych Admission Type (Psych Patients Only)  Admission Status Voluntary  Psychosocial Assessment  Patient Complaints Appetite decrease;Depression;Sadness;Sleep disturbance;Worrying  Eye Contact Brief  Facial Expression Sad;Flat;Pained  Affect Anxious;Depressed;Sad  Speech Soft;Slow;Logical/coherent  Interaction Isolative;Assertive  Motor Activity Slow;Unsteady  Appearance/Hygiene Disheveled  Behavior Characteristics Cooperative;Anxious;Calm  Mood Depressed;Anxious;Sad;Pleasant  Thought Process  Coherency WDL  Content WDL  Delusions None reported or observed  Perception WDL  Hallucination None reported or observed  Judgment Poor  Confusion None  Danger to Self  Current suicidal ideation? Passive  Self-Injurious Behavior No self-injurious ideation or behavior indicators observed or expressed   Agreement Not to Harm Self Yes  Description of Agreement  verbally contracts for safety  Danger to Others  Danger to Others None reported or observed

## 2019-10-06 DIAGNOSIS — F323 Major depressive disorder, single episode, severe with psychotic features: Secondary | ICD-10-CM | POA: Diagnosis not present

## 2019-10-06 LAB — CBC WITH DIFFERENTIAL/PLATELET
Abs Immature Granulocytes: 0.02 10*3/uL (ref 0.00–0.07)
Basophils Absolute: 0 10*3/uL (ref 0.0–0.1)
Basophils Relative: 1 %
Eosinophils Absolute: 0.1 10*3/uL (ref 0.0–0.5)
Eosinophils Relative: 3 %
HCT: 32.5 % — ABNORMAL LOW (ref 36.0–46.0)
Hemoglobin: 10.3 g/dL — ABNORMAL LOW (ref 12.0–15.0)
Immature Granulocytes: 1 %
Lymphocytes Relative: 25 %
Lymphs Abs: 0.8 10*3/uL (ref 0.7–4.0)
MCH: 30.4 pg (ref 26.0–34.0)
MCHC: 31.7 g/dL (ref 30.0–36.0)
MCV: 95.9 fL (ref 80.0–100.0)
Monocytes Absolute: 0.4 10*3/uL (ref 0.1–1.0)
Monocytes Relative: 12 %
Neutro Abs: 1.8 10*3/uL (ref 1.7–7.7)
Neutrophils Relative %: 58 %
Platelets: 70 10*3/uL — ABNORMAL LOW (ref 150–400)
RBC: 3.39 MIL/uL — ABNORMAL LOW (ref 3.87–5.11)
RDW: 15.9 % — ABNORMAL HIGH (ref 11.5–15.5)
WBC: 3.2 10*3/uL — ABNORMAL LOW (ref 4.0–10.5)
nRBC: 0 % (ref 0.0–0.2)

## 2019-10-06 LAB — RETICULOCYTES
Immature Retic Fract: 17.6 % — ABNORMAL HIGH (ref 2.3–15.9)
RBC.: 3.35 MIL/uL — ABNORMAL LOW (ref 3.87–5.11)
Retic Count, Absolute: 130.3 10*3/uL (ref 19.0–186.0)
Retic Ct Pct: 3.9 % — ABNORMAL HIGH (ref 0.4–3.1)

## 2019-10-06 LAB — COMPREHENSIVE METABOLIC PANEL
ALT: 21 U/L (ref 0–44)
AST: 43 U/L — ABNORMAL HIGH (ref 15–41)
Albumin: 3.2 g/dL — ABNORMAL LOW (ref 3.5–5.0)
Alkaline Phosphatase: 87 U/L (ref 38–126)
Anion gap: 8 (ref 5–15)
BUN: 12 mg/dL (ref 6–20)
CO2: 24 mmol/L (ref 22–32)
Calcium: 8.9 mg/dL (ref 8.9–10.3)
Chloride: 102 mmol/L (ref 98–111)
Creatinine, Ser: 0.58 mg/dL (ref 0.44–1.00)
GFR calc Af Amer: >60 mL/min (ref >60)
GFR calc non Af Amer: >60 mL/min (ref >60)
Glucose, Bld: 133 mg/dL — ABNORMAL HIGH (ref 70–99)
Potassium: 3.7 mmol/L (ref 3.5–5.1)
Sodium: 134 mmol/L — ABNORMAL LOW (ref 135–145)
Total Bilirubin: 3.6 mg/dL — ABNORMAL HIGH (ref 0.3–1.2)
Total Protein: 8 g/dL (ref 6.5–8.1)

## 2019-10-06 LAB — GLUCOSE, CAPILLARY
Glucose-Capillary: 105 mg/dL — ABNORMAL HIGH (ref 70–99)
Glucose-Capillary: 273 mg/dL — ABNORMAL HIGH (ref 70–99)
Glucose-Capillary: 279 mg/dL — ABNORMAL HIGH (ref 70–99)
Glucose-Capillary: 312 mg/dL — ABNORMAL HIGH (ref 70–99)

## 2019-10-06 LAB — FERRITIN: Ferritin: 21 ng/mL (ref 11–307)

## 2019-10-06 LAB — IRON AND TIBC
Iron: 59 ug/dL (ref 28–170)
Saturation Ratios: 14 % (ref 10.4–31.8)
TIBC: 423 ug/dL (ref 250–450)
UIBC: 364 ug/dL

## 2019-10-06 LAB — VITAMIN B12: Vitamin B-12: 1239 pg/mL — ABNORMAL HIGH (ref 180–914)

## 2019-10-06 LAB — FOLATE: Folate: 50.1 ng/mL (ref >5.9)

## 2019-10-06 MED ORDER — INSULIN ASPART 100 UNIT/ML ~~LOC~~ SOLN
10.0000 [IU] | Freq: Three times a day (TID) | SUBCUTANEOUS | Status: DC
  Administered 2019-10-06 – 2019-10-10 (×11): 10 [IU] via SUBCUTANEOUS

## 2019-10-06 MED ORDER — INSULIN GLARGINE 100 UNIT/ML ~~LOC~~ SOLN
30.0000 [IU] | Freq: Every day | SUBCUTANEOUS | Status: DC
  Administered 2019-10-07: 30 [IU] via SUBCUTANEOUS
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
## 2019-10-06 NOTE — Progress Notes (Signed)
Close Observation Note  D.  Pt slept very poorly, a recorded 1.25 hours last night.  Pt reported cramping feeling in abdomen.  No acute distress noted.    A.  Close observation continued as ordered for Pt safety  R  Pt remains safe on the unit.

## 2019-10-06 NOTE — Progress Notes (Signed)
   10/06/19 2333  COVID-19 Daily Checkoff  Have you had a fever (temp > 37.80C/100F)  in the past 24 hours?  No  If you have had runny nose, nasal congestion, sneezing in the past 24 hours, has it worsened? No  COVID-19 EXPOSURE  Have you traveled outside the state in the past 14 days? No  Have you been in contact with someone with a confirmed diagnosis of COVID-19 or PUI in the past 14 days without wearing appropriate PPE? No  Have you been living in the same home as a person with confirmed diagnosis of COVID-19 or a PUI (household contact)? No  Have you been diagnosed with COVID-19? No

## 2019-10-06 NOTE — Progress Notes (Signed)
Close Observation Note  D.  Pt resting with eyes closed, respirations even and unlabored, no distress noted.  A.  Close Observation continued as ordered for patient safety  R  Pt remains safe on the unit, will continue to monitor.

## 2019-10-06 NOTE — [facility] Suicide Risk Assessment (Signed)
[facility] INPATIENT:  Family/Significant Other Suicide Prevention Education  Suicide Prevention Education:  Patient Refusal for Family/Significant Other Suicide Prevention Education: The patient MIEKA LEATON has refused to provide written consent for family/significant other to be provided Family/Significant Other Suicide Prevention Education during admission and/or prior to discharge.  Physician notified.  Suicide Prevention Education was reviewed thoroughly with patient, including risk factors, warning signs, and what to do.  Mobile Crisis services were described and that telephone number pointed out, with encouragement to patient to put this number in personal cell phone.  Brochure was provided to patient to share with natural supports.  Patient acknowledged the ways in which they are at risk, and how working through each of their issues can gradually start to reduce their risk factors.  Patient was encouraged to think of the information in the context of people in their own lives.  Patient denied having access to firearms  Patient verbalized understanding of information provided.  Patient endorsed a desire to live.      Berlin Hun Grossman-Orr 10/06/2019, 9:55 AM

## 2019-10-06 NOTE — Progress Notes (Signed)
[place] NOVEL CORONAVIRUS (COVID-19) DAILY CHECK-OFF SYMPTOMS - answer yes or no to each - every day NO YES  Have you had a fever in the past 24 hours?  . Fever (Temp > 37.80C / 100F) X   Have you had any of these symptoms in the past 24 hours? . New Cough .  Sore Throat  .  Shortness of Breath .  Difficulty Breathing .  Unexplained Body Aches   X   Have you had any one of these symptoms in the past 24 hours not related to allergies?   . Runny Nose .  Nasal Congestion .  Sneezing   X   If you have had runny nose, nasal congestion, sneezing in the past 24 hours, has it worsened?  X   EXPOSURES - check yes or no X   Have you traveled outside the state in the past 14 days?  X   Have you been in contact with someone with a confirmed diagnosis of COVID-19 or PUI in the past 14 days without wearing appropriate PPE?  X   Have you been living in the same home as a person with confirmed diagnosis of COVID-19 or a PUI (household contact)?    X   Have you been diagnosed with COVID-19?    X              What to do next: Answered NO to all: Answered YES to anything:   Proceed with unit schedule Follow the [facility] Inpatient Flowsheet.   Athel Merriweather K. Ryan Palermo MSN, RN, [facility] [phone] 

## 2019-10-06 NOTE — Progress Notes (Signed)
[facility] Group Notes:  (Nursing/MHT/Case Management/Adjunct)  Date:  10/06/2019  Time:  2000  Type of Therapy:  wrap up group  Participation Level:  Active  Participation Quality:  Appropriate, Attentive, Sharing and Supportive  Affect:  Depressed  Cognitive:  Appropriate  Insight:  Improving  Engagement in Group:  Engaged  Modes of Intervention:  Confrontation, Education and Support  Summary of Progress/Problems: Positive thinking and self-care were discussed.  Shellia Cleverly 10/06/2019, 9:47 PM

## 2019-10-06 NOTE — Progress Notes (Signed)
  Pt is caucasian female  of 54 y.o. , DOB December 07, 1965, MRN  [id number]  presents Voluntary with a MDD,  Hx of Diabetes, liver disease    Patient  Reports better appetite, ate all breakfast, energy low, and poor sleeping pattern. Pt rates  depression 7 out of 10 and anxiety at 6 out of 10. Pt denies SI. Pt denies HI or AVH.  Pt unsure  Of LBM 10/04/19, denies pain . Vitals signs being monitored. Medication given as Rx, no adverse reaction observed, safety maintained with one- on- one monitoring.    Lolly Mustache. Bobby Rumpf MSN, [place], [facility] [phone]

## 2019-10-06 NOTE — Progress Notes (Signed)
   10/06/19 2335  Psych Admission Type (Psych Patients Only)  Admission Status Voluntary  Psychosocial Assessment  Patient Complaints Depression;Insomnia  Eye Contact Brief  Facial Expression Anxious  Affect Appropriate to circumstance  Speech Logical/coherent  Interaction Assertive  Motor Activity Slow;Unsteady (utilizes walker, sitter in place)  Appearance/Hygiene In scrubs;Improved  Behavior Characteristics Appropriate to situation  Mood Depressed;Pleasant  Thought Process  Coherency WDL  Content WDL  Delusions None reported or observed  Perception WDL  Hallucination None reported or observed  Judgment Poor  Confusion None  Danger to Self  Current suicidal ideation? Denies  Self-Injurious Behavior No self-injurious ideation or behavior indicators observed or expressed   Agreement Not to Harm Self Yes  Description of Agreement verbally contracts for safety  Danger to Others  Danger to Others None reported or observed

## 2019-10-06 NOTE — Progress Notes (Signed)
  Pt is caucasian female  of 54 y.o. , DOB September 06, 1965, MRN  [id number]  presents Voluntary MDD,  Hx of Liver disease, diabetes   Patient  reports poor appetite refused lunch, energy low, and poor sleeping.  Gait continues to be unsteady in am, pt attended group session. CBG 273 at 11:59 am. Pt c/o nausea , denies pain, PRN meds given as Rx,   no adverse reaction observed, Vitals signs being monitored, safety maintained with one -on- one monitoring. Pt denies SI, and AVH.     Lolly Mustache. Bobby Rumpf MSN, [place], [facility] [phone]

## 2019-10-06 NOTE — Progress Notes (Signed)
Close Observation Note:  D.  Pt sitting in dayroom with peers, attending group.  Pt accepted her 2000 Glucerna and was pleased that it was cold.  Pt requested that all ordered Glucernas be cold, easier for her to drink.  Pt denied complaints at this time, no distress noted.  A.  Close observation continued as ordered for Pt safety, support and encouragement offered.  R.  Pt remains safe on the unit.

## 2019-10-06 NOTE — [facility] Group Notes (Signed)
[facility] LCSW Group Therapy Note  Date/Time:  10/06/2019 9:00-10:00 or 10:00-11:00AM  Type of Therapy and Topic:  Group Therapy:  Healthy and Unhealthy Supports  Participation Level:  Active   Description of Group:  Patients in this group were introduced to the idea of adding a variety of healthy supports to address the various needs in their lives.Patients discussed what additional healthy supports could be helpful in their recovery and wellness after discharge in order to prevent future hospitalizations.   An emphasis was placed on using counselor, doctor, therapy groups, 12-step groups, and problem-specific support groups to expand supports.  The group really focused in large part on what has happened to patients when they have neglected themselves in favor of taking care of other people, and how that has not only NOT worked in the past, it will never be in their best interest to ignore their own needs. It was emphasized that, in fact, if a person ignores their own needs, they actually do not have the ability to care for anybody else at all in the long run.  This was discussed at length by various group members, and everyone listened attentively.  There were a lot of tears in group.  Therapeutic Goals:   1)  discuss importance of adding supports to stay well once out of the hospital  2)  compare healthy versus unhealthy supports and identify some examples of each  3)  generate ideas and descriptions of healthy supports that can be added  4)  offer mutual support about how to address unhealthy supports  5)  encourage active participation in and adherence to discharge plan    Summary of Patient Progress:  The patient expressed a willingness to add herself as support(s) to help in her recovery journey.  She shared frequently and beneficially from her own experience throughout group.  She showed insight and a willingness to be a better self-support, as well as an ability to put herself first in  her own life in order to be able to face her illness in the healthiest way possible.  She encouraged other patients continuously.   Therapeutic Modalities:   Motivational Interviewing Brief Solution-Focused Therapy  Selmer Dominion, LCSW

## 2019-10-06 NOTE — Progress Notes (Signed)
[facility] MD Progress Note  10/06/2019 1:18 PM [name]  MRN:  [id number]  Subjective: [name] reports, "I'm feeling a lot better emotionally. But, I slept very little last night. But, mood is improving, I'm starting to eat a little bit more. I'm feeling a little bit stronger. The walker is helping me with my walking & balance. I feel more stable today than I have felt in a while".  Objective: Patient is a 54 year old female with an unspecified past psychiatric history who originally presented to the [facility] emergency department after her boyfriend dropped her off.  The patient stated that she was not sleeping well, she was depressed, having anxiety and panic attacks.  She stated she was upset over the fact that she was dying.  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  She also has a history of alcoholic liver disease.  The patient stated that she did not drink the past, but overdosed on Tylenol multiple times between teenage years to when she was in her 79s or 62s.  She also has a history of cirrhosis with portal hypertension, small volume abdominal ascites and previous admissions for hepatic encephalopathy.  She also has a history of diabetes.  She stated that she had last seen a psychiatrist a couple years ago.  She was seen in either [facility] or [facility] but did not like them.  [name] is seen, chart reviewed. The chart findings discussed with the treatment team. She presents alert & oriented x 4, seem a bit stronger & less frail-looking, ambulating with a walker on a close-up observation. Her affect is bright & reactive. She is making good eye contact. She is visible on the unit, attending group sessions. She says she is doing a lot better emotionally. She complained that she slept very little last night. This is confirmed by the 1.25 hrs of sleep documented. Her appetite is gradually improving. She was ordered some Glucerna tid in between meals. She uses  walker to aid her mobility. Reviewed new lab reports, AST slightly elevated at 43, Ammonia 75 & hemoglobin 10.3, slightly improved from 9.1 previously. Patient denies any SIHI, AVH, delusional thoughts or paranoia.She does not appear to be responding to any internal stimuli. She is agreement to continue current plan of care as already in progress.   Principal Problem: Severe recurrent major depressive disorder with psychotic features ([place])  Diagnosis: Principal Problem:   Severe recurrent major depressive disorder with psychotic features ([place]) Active Problems:   Major depressive disorder, single episode, severe with psychotic features ([place])   Depression  Total Time spent with patient: 15 minutes  Past Psychiatric History: See H&P  Past Medical History:  Past Medical History:  Diagnosis Date   Degenerative joint disease    Diabetes mellitus without complication ([place])    Gastroesophageal reflux    Pancreatitis    Panic attack    Sleep apnea    Thrombocytopenia ([place])     Past Surgical History:  Procedure Laterality Date   CHOLECYSTECTOMY     SHOULDER SURGERY     left    tongue sx. titanium screw placed to hold tongue down     TOTAL THYROIDECTOMY     Family History: History reviewed. No pertinent family history.   Family Psychiatric  History: See H&P  Social History:  Social History   Substance and Sexual Activity  Alcohol Use No     Social History   Substance and Sexual Activity  Drug Use  No    Social History   Socioeconomic History   Marital status: Single    Spouse name: Not on file   Number of children: Not on file   Years of education: Not on file   Highest education level: Not on file  Occupational History   Not on file  Tobacco Use   Smoking status: Never Smoker   Smokeless tobacco: Never Used  Vaping Use   Vaping Use: Never used  Substance and Sexual Activity   Alcohol use: No   Drug use: No   Sexual activity: Not Currently   Other Topics Concern   Not on file  Social History Narrative   Not on file   Social Determinants of Health   Financial Resource Strain:    Difficulty of Paying Living Expenses:   Food Insecurity:    Worried About Charity fundraiser in the Last Year:    Arboriculturist in the Last Year:   Transportation Needs:    Film/video editor (Medical):    Lack of Transportation (Non-Medical):   Physical Activity:    Days of Exercise per Week:    Minutes of Exercise per Session:   Stress:    Feeling of Stress :   Social Connections:    Frequency of Communication with Friends and Family:    Frequency of Social Gatherings with Friends and Family:    Attends Religious Services:    Active Member of Clubs or Organizations:    Attends Archivist Meetings:    Marital Status:    Additional Social History:   Sleep: Poor  Appetite:  Fair  Current Medications: Current Facility-Administered Medications  Medication Dose Route Frequency Provider Last Rate Last Admin   albuterol (VENTOLIN HFA) 108 (90 Base) MCG/ACT inhaler 1-2 puff  1-2 puff Inhalation Q6H PRN Sharma Covert, MD       alum & mag hydroxide-simeth (MAALOX/MYLANTA) 200-200-20 MG/5ML suspension 30 mL  30 mL Oral Q4H PRN Sharma Covert, MD       amoxicillin (AMOXIL) capsule 250 mg  250 mg Oral Q8H Sharma Covert, MD   250 mg at 10/06/19 5993   ARIPiprazole (ABILIFY) tablet 2 mg  2 mg Oral Daily Sharma Covert, MD   2 mg at 10/06/19 0845   empagliflozin (JARDIANCE) tablet 25 mg  25 mg Oral Daily Sharma Covert, MD   25 mg at 10/06/19 0854   feeding supplement (GLUCERNA SHAKE) (GLUCERNA SHAKE) liquid 237 mL  237 mL Oral TID BM Edahi Kroening, Herbert Pun I, NP   237 mL at 10/06/19 1045   fluconazole (DIFLUCAN) tablet 100 mg  100 mg Oral Daily Sharma Covert, MD   100 mg at 10/06/19 0853   FLUoxetine (PROZAC) capsule 10 mg  10 mg Oral Daily Lindell Spar I, NP   10 mg at 57/01/77 9390    folic acid (FOLVITE) tablet 1 mg  1 mg Oral Daily Sharma Covert, MD   1 mg at 10/06/19 3009   gabapentin (NEURONTIN) capsule 100 mg  100 mg Oral TID Lindell Spar I, NP   100 mg at 10/06/19 0853   hydrOXYzine (ATARAX/VISTARIL) tablet 25 mg  25 mg Oral TID PRN Sharma Covert, MD       insulin aspart (novoLOG) injection 0-15 Units  0-15 Units Subcutaneous TID WC Sharma Covert, MD   8 Units at 10/06/19 1248   insulin aspart (novoLOG) injection 10 Units  10 Units Subcutaneous TID WC  Sharma Covert, MD       [START ON 10/07/2019] insulin glargine (LANTUS) injection 30 Units  30 Units Subcutaneous Daily Sharma Covert, MD       ipratropium-albuterol (DUONEB) 0.5-2.5 (3) MG/3ML nebulizer solution 3 mL  3 mL Nebulization Q4H PRN Sharma Covert, MD       lactulose ([place]) 10 GM/15ML solution 20 g  20 g Oral BID Sharma Covert, MD   20 g at 10/06/19 1610   levothyroxine (SYNTHROID) tablet 50 mcg  50 mcg Oral Q0600 Sharma Covert, MD   50 mcg at 10/06/19 9604   lipase/protease/amylase (CREON) capsule 24,000 Units  24,000 Units Oral TID AC Sharma Covert, MD   24,000 Units at 10/06/19 1250   magnesium hydroxide (MILK OF MAGNESIA) suspension 30 mL  30 mL Oral Daily PRN Sharma Covert, MD       megestrol (MEGACE) tablet 40 mg  40 mg Oral Daily Sharma Covert, MD   40 mg at 10/06/19 0845   mirtazapine (REMERON) tablet 7.5 mg  7.5 mg Oral QHS Neziah Braley, Herbert Pun I, NP   7.5 mg at 10/05/19 2145   ondansetron (ZOFRAN) tablet 4 mg  4 mg Oral Q6H PRN Lindell Spar I, NP   4 mg at 10/05/19 2146   pantoprazole (PROTONIX) EC tablet 40 mg  40 mg Oral Daily Sharma Covert, MD   40 mg at 10/06/19 5409   spironolactone (ALDACTONE) tablet 12.5 mg  12.5 mg Oral Daily Sharma Covert, MD   12.5 mg at 10/06/19 0844   tiZANidine (ZANAFLEX) tablet 4 mg  4 mg Oral Q8H PRN Sharma Covert, MD       Vitamin D (Ergocalciferol) (DRISDOL) capsule 50,000 Units  50,000  Units Oral Q Esmond Camper, MD   50,000 Units at 10/04/19 1236   Lab Results:  Results for orders placed or performed during the hospital encounter of 10/04/19 (from the past 48 hour(s))  Glucose, capillary     Status: Abnormal   Collection Time: 10/04/19  2:10 PM  Result Value Ref Range   Glucose-Capillary 298 (H) 70 - 99 mg/dL    Comment: Glucose reference range applies only to samples taken after fasting for at least 8 hours.  Glucose, capillary     Status: Abnormal   Collection Time: 10/04/19  4:29 PM  Result Value Ref Range   Glucose-Capillary 190 (H) 70 - 99 mg/dL    Comment: Glucose reference range applies only to samples taken after fasting for at least 8 hours.  Urinalysis, Complete w Microscopic     Status: Abnormal   Collection Time: 10/04/19  6:02 PM  Result Value Ref Range   Color, Urine YELLOW YELLOW   APPearance HAZY (A) CLEAR   Specific Gravity, Urine 1.026 1.005 - 1.030   pH 6.0 5.0 - 8.0   Glucose, UA >=500 (A) NEGATIVE mg/dL   Hgb urine dipstick LARGE (A) NEGATIVE   Bilirubin Urine NEGATIVE NEGATIVE   Ketones, ur NEGATIVE NEGATIVE mg/dL   Protein, ur NEGATIVE NEGATIVE mg/dL   Nitrite NEGATIVE NEGATIVE   Leukocytes,Ua TRACE (A) NEGATIVE   RBC / HPF 11-20 0 - 5 RBC/hpf   WBC, UA 21-50 0 - 5 WBC/hpf   Bacteria, UA RARE (A) NONE SEEN   Squamous Epithelial / LPF 0-5 0 - 5   Budding Yeast PRESENT     Comment: Performed at [facility], [place] [address], [place], [place] [address]  TSH     Status: None   Collection Time: 10/04/19  6:11 PM  Result Value Ref Range   TSH 0.522 0.350 - 4.500 uIU/mL    Comment: Performed by a 3rd Generation assay with a functional sensitivity of <=0.01 uIU/mL. Performed at [facility], [place] [address] [place], [place] [address]   Hemoglobin A1c     Status: Abnormal   Collection Time: 10/04/19  6:11 PM  Result Value Ref Range   Hgb A1c MFr Bld 7.4 (H) 4.8 - 5.6 %    Comment: (NOTE) Pre  diabetes:          5.7%-6.4%  Diabetes:              >6.4%  Glycemic control for   <7.0% adults with diabetes    Mean Plasma Glucose 165.68 mg/dL    Comment: Performed at [place] [address], [place], [place] [address]  Glucose, capillary     Status: Abnormal   Collection Time: 10/04/19 10:17 PM  Result Value Ref Range   Glucose-Capillary 265 (H) 70 - 99 mg/dL    Comment: Glucose reference range applies only to samples taken after fasting for at least 8 hours.   Comment 1 Notify RN    Comment 2 Document in Chart   Glucose, capillary     Status: Abnormal   Collection Time: 10/05/19  1:12 AM  Result Value Ref Range   Glucose-Capillary 207 (H) 70 - 99 mg/dL    Comment: Glucose reference range applies only to samples taken after fasting for at least 8 hours.   Comment 1 Notify RN    Comment 2 Document in Chart   Glucose, capillary     Status: Abnormal   Collection Time: 10/05/19  6:10 AM  Result Value Ref Range   Glucose-Capillary 127 (H) 70 - 99 mg/dL    Comment: Glucose reference range applies only to samples taken after fasting for at least 8 hours.   Comment 1 Notify RN    Comment 2 Document in Chart   Glucose, capillary     Status: Abnormal   Collection Time: 10/05/19 11:54 AM  Result Value Ref Range   Glucose-Capillary 215 (H) 70 - 99 mg/dL    Comment: Glucose reference range applies only to samples taken after fasting for at least 8 hours.  Glucose, capillary     Status: Abnormal   Collection Time: 10/05/19  4:53 PM  Result Value Ref Range   Glucose-Capillary 156 (H) 70 - 99 mg/dL    Comment: Glucose reference range applies only to samples taken after fasting for at least 8 hours.  Ammonia     Status: Abnormal   Collection Time: 10/05/19  5:49 PM  Result Value Ref Range   Ammonia 75 (H) 9 - 35 umol/L    Comment: Performed at [facility], [place] [address], [place], [place] [address]  Glucose, capillary     Status: Abnormal   Collection  Time: 10/05/19  7:52 PM  Result Value Ref Range   Glucose-Capillary 250 (H) 70 - 99 mg/dL    Comment: Glucose reference range applies only to samples taken after fasting for at least 8 hours.   Comment 1 Notify RN    Comment 2 Document in Chart   Glucose, capillary     Status: Abnormal   Collection Time: 10/06/19  5:57 AM  Result Value Ref Range   Glucose-Capillary 105 (H) 70 - 99 mg/dL  Comment: Glucose reference range applies only to samples taken after fasting for at least 8 hours.  CBC with Differential/Platelet     Status: Abnormal   Collection Time: 10/06/19  7:22 AM  Result Value Ref Range   WBC 3.2 (L) 4.0 - 10.5 K/uL   RBC 3.39 (L) 3.87 - 5.11 MIL/uL   Hemoglobin 10.3 (L) 12.0 - 15.0 g/dL   HCT 32.5 (L) 36 - 46 %   MCV 95.9 80.0 - 100.0 fL   MCH 30.4 26.0 - 34.0 pg   MCHC 31.7 30.0 - 36.0 g/dL   RDW 15.9 (H) 11.5 - 15.5 %   Platelets 70 (L) 150 - 400 K/uL    Comment: PLATELET COUNT CONFIRMED BY SMEAR SPECIMEN CHECKED FOR CLOTS Immature Platelet Fraction may be clinically indicated, consider ordering this additional test [id number]    nRBC 0.0 0.0 - 0.2 %   Neutrophils Relative % 58 %   Neutro Abs 1.8 1.7 - 7.7 K/uL   Lymphocytes Relative 25 %   Lymphs Abs 0.8 0.7 - 4.0 K/uL   Monocytes Relative 12 %   Monocytes Absolute 0.4 0 - 1 K/uL   Eosinophils Relative 3 %   Eosinophils Absolute 0.1 0 - 0 K/uL   Basophils Relative 1 %   Basophils Absolute 0.0 0 - 0 K/uL   Immature Granulocytes 1 %   Abs Immature Granulocytes 0.02 0.00 - 0.07 K/uL    Comment: Performed at [facility], [place] [address], [place], [place] [address]  Comprehensive metabolic panel     Status: Abnormal   Collection Time: 10/06/19  7:22 AM  Result Value Ref Range   Sodium 134 (L) 135 - 145 mmol/L   Potassium 3.7 3.5 - 5.1 mmol/L   Chloride 102 98 - 111 mmol/L   CO2 24 22 - 32 mmol/L   Glucose, Bld 133 (H) 70 - 99 mg/dL    Comment: Glucose reference range applies only to  samples taken after fasting for at least 8 hours.   BUN 12 6 - 20 mg/dL   Creatinine, Ser 0.58 0.44 - 1.00 mg/dL   Calcium 8.9 8.9 - 10.3 mg/dL   Total Protein 8.0 6.5 - 8.1 g/dL   Albumin 3.2 (L) 3.5 - 5.0 g/dL   AST 43 (H) 15 - 41 U/L   ALT 21 0 - 44 U/L   Alkaline Phosphatase 87 38 - 126 U/L   Total Bilirubin 3.6 (H) 0.3 - 1.2 mg/dL   GFR calc non Af Amer >60 >60 mL/min   GFR calc Af Amer >60 >60 mL/min   Anion gap 8 5 - 15    Comment: Performed at [facility], [place] [address], [place], [place] [address]  Vitamin B12     Status: Abnormal   Collection Time: 10/06/19  7:22 AM  Result Value Ref Range   Vitamin B-12 1,239 (H) 180 - 914 pg/mL    Comment: (NOTE) This assay is not validated for testing neonatal or myeloproliferative syndrome specimens for Vitamin B12 levels. Performed at [facility], [place] [address] [place], [place] [address]   Folate     Status: None   Collection Time: 10/06/19  7:22 AM  Result Value Ref Range   Folate 50.1 >5.9 ng/mL    Comment: RESULTS CONFIRMED BY MANUAL DILUTION Performed at [place] [address], [place], [place] [address]   Iron and TIBC     Status: None   Collection  Time: 10/06/19  7:22 AM  Result Value Ref Range   Iron 59 28 - 170 ug/dL   TIBC 423 250 - 450 ug/dL   Saturation Ratios 14 10.4 - 31.8 %   UIBC 364 ug/dL    Comment: Performed at [facility], [place] [address], [place], [place] [address]  Ferritin     Status: None   Collection Time: 10/06/19  7:22 AM  Result Value Ref Range   Ferritin 21 11 - 307 ng/mL    Comment: Performed at [facility], [place] [address], [place], [place] [address]  Reticulocytes     Status: Abnormal   Collection Time: 10/06/19  7:22 AM  Result Value Ref Range   Retic Ct Pct 3.9 (H) 0.4 - 3.1 %   RBC. 3.35 (L) 3.87 - 5.11 MIL/uL   Retic Count, Absolute 130.3 19.0 - 186.0 K/uL   Immature Retic Fract  17.6 (H) 2.3 - 15.9 %    Comment: Performed at [facility], [place] [address], [place], [place] [address]  Glucose, capillary     Status: Abnormal   Collection Time: 10/06/19 11:54 AM  Result Value Ref Range   Glucose-Capillary 273 (H) 70 - 99 mg/dL    Comment: Glucose reference range applies only to samples taken after fasting for at least 8 hours.   Comment 1 Notify RN    Comment 2 Document in Chart    Blood Alcohol level:  Lab Results  Component Value Date   ETH <10 80/05/4915   Metabolic Disorder Labs: Lab Results  Component Value Date   HGBA1C 7.4 (H) 10/04/2019   MPG 165.68 10/04/2019   MPG 211.6 01/26/2019   No results found for: PROLACTIN No results found for: CHOL, TRIG, HDL, CHOLHDL, VLDL, LDLCALC  Physical Findings: AIMS: Facial and Oral Movements Muscles of Facial Expression: None, normal Lips and Perioral Area: None, normal Jaw: None, normal Tongue: None, normal,Extremity Movements Upper (arms, wrists, hands, fingers): None, normal Lower (legs, knees, ankles, toes): None, normal, Trunk Movements Neck, shoulders, hips: None, normal, Overall Severity Severity of abnormal movements (highest score from questions above): None, normal Incapacitation due to abnormal movements: None, normal Patient's awareness of abnormal movements (rate only patient's report): No Awareness, Dental Status Current problems with teeth and/or dentures?: No Does patient usually wear dentures?: No  CIWA:    COWS:     Musculoskeletal: Strength & Muscle Tone: within normal limits Gait & Station: normal Patient leans: N/A  Psychiatric Specialty Exam: Physical Exam Vitals and nursing note reviewed.  HENT:     Head: Normocephalic.     Nose: Nose normal.     Mouth/Throat:     Pharynx: Oropharynx is clear.  Eyes:     Pupils: Pupils are equal, round, and reactive to light.  Cardiovascular:     Pulses: Normal pulses.  Pulmonary:     Effort: Pulmonary effort is  normal.  Genitourinary:    Comments: Deferred Musculoskeletal:        General: Normal range of motion.     Cervical back: Normal range of motion.  Skin:    General: Skin is warm and dry.  Neurological:     Mental Status: She is alert and oriented to person, place, and time.     Review of Systems  Constitutional: Negative for chills, diaphoresis and fever.  HENT: Negative for congestion, rhinorrhea, sneezing and sore throat.   Eyes: Negative for discharge.  Respiratory: Negative for chest tightness, shortness of  breath and wheezing.   Cardiovascular: Negative for chest pain and palpitations.  Gastrointestinal: Negative for diarrhea, nausea and vomiting.  Endocrine: Negative for cold intolerance.  Genitourinary: Negative for difficulty urinating.  Musculoskeletal: Negative for arthralgias.       Guest presents with weak gait/balance. Currently ambulating with walker & assist.  Skin: Negative.   Allergic/Immunologic:       Allergies: Acetaminophen   Cephalexin   Ciprofloxacin Hydromorphone Sulfamethoxazole  Trimethoprim Trimethoprim Dilaudid (Hydromorphone  Neurological: Positive for dizziness and weakness (Generalized).  Psychiatric/Behavioral: Positive for dysphoric mood. Negative for agitation, behavioral problems, confusion, decreased concentration, hallucinations, self-injury, sleep disturbance and suicidal ideas. The patient is nervous/anxious. The patient is not hyperactive.     Blood pressure (!) 152/67, pulse 98, temperature 98.7 F (37.1 C), temperature source Oral, resp. rate 18, height 5' 3"  (1.6 m), weight 45.4 kg, SpO2 99 %.Body mass index is 17.71 kg/m.  General Appearance: Disheveled  Eye Contact:  Minimal  Speech:  Normal Rate  Volume:  Decreased  Mood: "Improving"  Affect:  Congruent  Thought Process:  Goal Directed and Descriptions of Associations: Intact  Orientation:  Full (Time, Place, and Person)  Thought Content:  Negative  Suicidal Thoughts:   Denies any thoughts, plans or intent.  Homicidal Thoughts:  No  Memory:  Immediate; Good Recent; Good Remote; Fair  Judgement: Intact  Insight: Fair  Psychomotor Activity:  Decreased  Concentration:  Concentration: Fair and Attention Span: Fair  Recall:  Poor  Fund of Knowledge:  Fair  Language:  Fair  Akathisia:  Negative  Handed:  Right  AIMS (if indicated):     Assets:  Desire for Improvement Resilience  ADL's:  Impaired  Cognition:  Impaired,    Sleep:  Number of Hours: 1.25   Treatment Plan Summary: Daily contact with patient to assess and evaluate symptoms and progress in treatment and Medication management.  - Continue inpatient hospitalization. - Will continue today 10/06/2019 plan as below except where it is noted.  Mood control/adjunct to antidepressant.     - Continue Abilify 2 mg po daily.  Depression/anxiety    - Continue Prozac 10 mg po daily.    - Continue Vistaril 25 mg po tid prn.  Agitation/diabetic neuropathy.    - Continue gabapentin 100 mg po tid.  Insomnia.    - Continue Mirtazapine 7.5 mg po Q hs.    - Vistaril 50 mg po Q hs.  Other medical issues.    - Continue Amoxil 250 mg po tid for infection.    - Continue Jardiance 25 mg po po daily for diabetes management.     - Continue albuterol inhaler 1-2 puffs Q 6 hrs prn for SOB.     - Continue Diflucan 100 mg po daily for infection.     - Continue Folic acid 1 mg po daily for nutritional supplement.     - Continue sliding scale insulin coverage with Novolog per BS results.     - Continue Novolog insulin 8 units tif for meal coverage.     - Continue Lantus insulin 20 units Sub Q daily for DM management.     - Continue Duoneb 3 ml per inhalation Q 4 hrs prn for SOB.     -  Continue Chronulac 20 gm po bid for increased ammonium level.     - Continue synThyroid 50 mcg po daily for hypothyroidism.     - Continue Creon 24,000 units tid for pancreatitis.     - Continue  Megace 40 mg po daily for  nutritional supplement.     - Continue Zofran 4 mg po Q 4 hrs for n/v.     - Continue Protonix 40 mg po for GERD.     - Continue Aldactone 12.5 mg po daily for HTN.     - Continue Zanaflex 4 mg po Q 8 hrs prn for muscle spasms.     - Continue Vit D 50, 000 units daily for bone health.  Continue 1:1 supervision due to generalized weakness. Patient uses the walker to aid mobility. Encourage group participation. Discharge disposition in progress. Continue 1:1 supervision for safety.   Lindell Spar, NP, PMHNP, FNP-BC 10/06/2019, 1:18 PMPatient ID: [name], female   DOB: Aug 04, 1965, 54 y.o.   MRN: [id number]

## 2019-10-07 DIAGNOSIS — F333 Major depressive disorder, recurrent, severe with psychotic symptoms: Secondary | ICD-10-CM | POA: Diagnosis not present

## 2019-10-07 LAB — GLUCOSE, CAPILLARY
Glucose-Capillary: 208 mg/dL — ABNORMAL HIGH (ref 70–99)
Glucose-Capillary: 214 mg/dL — ABNORMAL HIGH (ref 70–99)
Glucose-Capillary: 241 mg/dL — ABNORMAL HIGH (ref 70–99)
Glucose-Capillary: 303 mg/dL — ABNORMAL HIGH (ref 70–99)
Glucose-Capillary: 306 mg/dL — ABNORMAL HIGH (ref 70–99)

## 2019-10-07 MED ORDER — MIRTAZAPINE 15 MG PO TABS
15.0000 mg | ORAL_TABLET | Freq: Every day | ORAL | Status: DC
  Administered 2019-10-07 – 2019-10-09 (×3): 15 mg via ORAL
  Filled 2019-10-07 (×5): qty 1

## 2019-10-07 MED ORDER — FLUOXETINE HCL 20 MG PO CAPS
20.0000 mg | ORAL_CAPSULE | Freq: Every day | ORAL | Status: DC
  Administered 2019-10-08 – 2019-10-10 (×3): 20 mg via ORAL
  Filled 2019-10-07 (×5): qty 1

## 2019-10-07 MED ORDER — ARIPIPRAZOLE 5 MG PO TABS
5.0000 mg | ORAL_TABLET | Freq: Every day | ORAL | Status: DC
  Administered 2019-10-08 – 2019-10-10 (×3): 5 mg via ORAL
  Filled 2019-10-07 (×4): qty 1

## 2019-10-07 MED ORDER — GABAPENTIN 100 MG PO CAPS
200.0000 mg | ORAL_CAPSULE | Freq: Three times a day (TID) | ORAL | Status: DC
  Administered 2019-10-07 – 2019-10-10 (×9): 200 mg via ORAL
  Filled 2019-10-07 (×12): qty 2

## 2019-10-07 MED ORDER — INSULIN GLARGINE 100 UNIT/ML ~~LOC~~ SOLN
35.0000 [IU] | Freq: Every day | SUBCUTANEOUS | Status: DC
  Administered 2019-10-08 – 2019-10-10 (×3): 35 [IU] via SUBCUTANEOUS

## 2019-10-07 MED ORDER — INSULIN ASPART 100 UNIT/ML ~~LOC~~ SOLN
2.0000 [IU] | Freq: Once | SUBCUTANEOUS | Status: AC
  Administered 2019-10-07: 2 [IU] via SUBCUTANEOUS

## 2019-10-07 NOTE — Progress Notes (Signed)
[facility] MD Progress Note  10/07/2019 10:50 AM [name]  MRN:  [id number] Subjective: Patient is a 54 year old female with a past psychiatric history significant for unspecified depression and anxiety who originally presented to the [facility] emergency department on 10/01/2019 because she was not sleeping well, was depressed, and having anxiety.  Objective: Patient is seen and examined.  Patient is a 54 year old female with the above-stated past psychiatric history and the previous diagnosis of cirrhosis, chronic pancreatitis, diabetes etc.  She is seen in follow-up.  She stated that her mood feels a bit better.  She denied any suicidal ideation this morning.  She stated she is having some abdominal pain.  She stated her appetite is still not great.  Nursing notes reflect that she ate fairly well this morning.  She has been seen by physical therapy, and walks with a walker.  She had laboratories done yesterday that revealed improvement in her sodium to 134, improvement in her liver function enzymes and that they had decreased, but her ammonia was still elevated at 75.  Her ammonia was not checked in the emergency room during their initial evaluation.  She also had an anemia panel done that basically revealed an anemia of chronic disease.  Her hemoglobin, hematocrit and white blood cell count all remain the same.  Her platelet count has increased up to 70,000.  They have not repeated the urine culture but she remains on amoxicillin for urinary tract infection and is well is on Diflucan for a yeast component to the urinary tract infection.  She continues to complain of abdominal pain.  She stated she has had that for quite a while.  Some of that may be related to her chronic pancreatitis.  It was noted that she did eat biscuits and gravy this morning, which was probably not the greatest choice for her given her chronic pancreatitis.  Her blood pressure remains low normal.  It is 97/44 this  morning.  She is afebrile.  She only slept 4.25 hours last night.  No suicidal or homicidal ideation.  Her last weight in the chart was from 7/16 and she was 45.36 kg.  Her blood sugar this morning is 214.  Her sugars are slowly improving.  Previously they were 279 at 312 respectively.  Principal Problem: Severe recurrent major depressive disorder with psychotic features ([place]) Diagnosis: Principal Problem:   Severe recurrent major depressive disorder with psychotic features ([place]) Active Problems:   Depression   Major depressive disorder, single episode, severe with psychotic features ([place])  Total Time spent with patient: 20 minutes  Past Psychiatric History: See admission H&P  Past Medical History:  Past Medical History:  Diagnosis Date  . Degenerative joint disease   . Diabetes mellitus without complication ([place])   . Gastroesophageal reflux   . Pancreatitis   . Panic attack   . Sleep apnea   . Thrombocytopenia ([place])     Past Surgical History:  Procedure Laterality Date  . CHOLECYSTECTOMY    . SHOULDER SURGERY     left   . tongue sx. titanium screw placed to hold tongue down    . TOTAL THYROIDECTOMY     Family History: History reviewed. No pertinent family history. Family Psychiatric  History: See admission H&P Social History:  Social History   Substance and Sexual Activity  Alcohol Use No     Social History   Substance and Sexual Activity  Drug Use No    Social History   Socioeconomic  History  . Marital status: Single    Spouse name: Not on file  . Number of children: Not on file  . Years of education: Not on file  . Highest education level: Not on file  Occupational History  . Not on file  Tobacco Use  . Smoking status: Never Smoker  . Smokeless tobacco: Never Used  Vaping Use  . Vaping Use: Never used  Substance and Sexual Activity  . Alcohol use: No  . Drug use: No  . Sexual activity: Not Currently  Other Topics Concern  . Not on file  Social  History Narrative  . Not on file   Social Determinants of Health   Financial Resource Strain:   . Difficulty of Paying Living Expenses:   Food Insecurity:   . Worried About Charity fundraiser in the Last Year:   . Arboriculturist in the Last Year:   Transportation Needs:   . Film/video editor (Medical):   Marland Kitchen Lack of Transportation (Non-Medical):   Physical Activity:   . Days of Exercise per Week:   . Minutes of Exercise per Session:   Stress:   . Feeling of Stress :   Social Connections:   . Frequency of Communication with Friends and Family:   . Frequency of Social Gatherings with Friends and Family:   . Attends Religious Services:   . Active Member of Clubs or Organizations:   . Attends Archivist Meetings:   Marland Kitchen Marital Status:    Additional Social History:                         Sleep: Poor  Appetite:  Fair  Current Medications: Current Facility-Administered Medications  Medication Dose Route Frequency Provider Last Rate Last Admin  . albuterol (VENTOLIN HFA) 108 (90 Base) MCG/ACT inhaler 1-2 puff  1-2 puff Inhalation Q6H PRN Sharma Covert, MD      . alum & mag hydroxide-simeth (MAALOX/MYLANTA) 200-200-20 MG/5ML suspension 30 mL  30 mL Oral Q4H PRN Sharma Covert, MD      . amoxicillin (AMOXIL) capsule 250 mg  250 mg Oral Q8H Sharma Covert, MD   250 mg at 10/07/19 8119  . ARIPiprazole (ABILIFY) tablet 2 mg  2 mg Oral Daily Sharma Covert, MD   2 mg at 10/07/19 0918  . empagliflozin (JARDIANCE) tablet 25 mg  25 mg Oral Daily Sharma Covert, MD   25 mg at 10/07/19 0921  . feeding supplement (GLUCERNA SHAKE) (GLUCERNA SHAKE) liquid 237 mL  237 mL Oral TID BM Nwoko, Agnes I, NP   237 mL at 10/07/19 1015  . fluconazole (DIFLUCAN) tablet 100 mg  100 mg Oral Daily Sharma Covert, MD   100 mg at 10/07/19 0918  . FLUoxetine (PROZAC) capsule 10 mg  10 mg Oral Daily Lindell Spar I, NP   10 mg at 10/07/19 0918  . folic acid  (FOLVITE) tablet 1 mg  1 mg Oral Daily Sharma Covert, MD   1 mg at 10/07/19 0918  . gabapentin (NEURONTIN) capsule 100 mg  100 mg Oral TID Lindell Spar I, NP   100 mg at 10/07/19 0918  . hydrOXYzine (ATARAX/VISTARIL) tablet 25 mg  25 mg Oral TID PRN Sharma Covert, MD   25 mg at 10/06/19 2124  . insulin aspart (novoLOG) injection 0-15 Units  0-15 Units Subcutaneous TID WC Mallie Darting Cordie Grice, MD   5 Units at  10/07/19 0102  . insulin aspart (novoLOG) injection 10 Units  10 Units Subcutaneous TID WC Sharma Covert, MD   10 Units at 10/07/19 [phone]  . insulin glargine (LANTUS) injection 30 Units  30 Units Subcutaneous Daily Sharma Covert, MD   30 Units at 10/07/19 [phone]  . ipratropium-albuterol (DUONEB) 0.5-2.5 (3) MG/3ML nebulizer solution 3 mL  3 mL Nebulization Q4H PRN Sharma Covert, MD      . lactulose (CHRONULAC) 10 GM/15ML solution 20 g  20 g Oral BID Sharma Covert, MD   20 g at 10/07/19 0924  . levothyroxine (SYNTHROID) tablet 50 mcg  50 mcg Oral Q0600 Sharma Covert, MD   50 mcg at 10/07/19 [phone]  . lipase/protease/amylase (CREON) capsule 24,000 Units  24,000 Units Oral TID AC Sharma Covert, MD   24,000 Units at 10/07/19 [phone]  . magnesium hydroxide (MILK OF MAGNESIA) suspension 30 mL  30 mL Oral Daily PRN Sharma Covert, MD      . megestrol (MEGACE) tablet 40 mg  40 mg Oral Daily Sharma Covert, MD   40 mg at 10/07/19 5638  . mirtazapine (REMERON) tablet 7.5 mg  7.5 mg Oral QHS Lindell Spar I, NP   7.5 mg at 10/06/19 2124  . ondansetron (ZOFRAN) tablet 4 mg  4 mg Oral Q6H PRN Lindell Spar I, NP   4 mg at 10/06/19 2125  . pantoprazole (PROTONIX) EC tablet 40 mg  40 mg Oral Daily Sharma Covert, MD   40 mg at 10/07/19 7564  . spironolactone (ALDACTONE) tablet 12.5 mg  12.5 mg Oral Daily Sharma Covert, MD   12.5 mg at 10/07/19 0916  . tiZANidine (ZANAFLEX) tablet 4 mg  4 mg Oral Q8H PRN Sharma Covert, MD   4 mg at 10/06/19 2124  . Vitamin D  (Ergocalciferol) (DRISDOL) capsule 50,000 Units  50,000 Units Oral Q Fri Cristyn Crossno Lawson, MD   50,000 Units at 10/04/19 1236    Lab Results:  Results for orders placed or performed during the hospital encounter of 10/04/19 (from the past 48 hour(s))  Glucose, capillary     Status: Abnormal   Collection Time: 10/05/19 11:54 AM  Result Value Ref Range   Glucose-Capillary 215 (H) 70 - 99 mg/dL    Comment: Glucose reference range applies only to samples taken after fasting for at least 8 hours.  Glucose, capillary     Status: Abnormal   Collection Time: 10/05/19  4:53 PM  Result Value Ref Range   Glucose-Capillary 156 (H) 70 - 99 mg/dL    Comment: Glucose reference range applies only to samples taken after fasting for at least 8 hours.  Ammonia     Status: Abnormal   Collection Time: 10/05/19  5:49 PM  Result Value Ref Range   Ammonia 75 (H) 9 - 35 umol/L    Comment: Performed at [facility], [place] [address] [place], [place] [address]  Glucose, capillary     Status: Abnormal   Collection Time: 10/05/19  7:52 PM  Result Value Ref Range   Glucose-Capillary 250 (H) 70 - 99 mg/dL    Comment: Glucose reference range applies only to samples taken after fasting for at least 8 hours.   Comment 1 Notify RN    Comment 2 Document in Chart   Glucose, capillary     Status: Abnormal   Collection Time: 10/06/19  5:57 AM  Result Value Ref Range   Glucose-Capillary 105 (  H) 70 - 99 mg/dL    Comment: Glucose reference range applies only to samples taken after fasting for at least 8 hours.  CBC with Differential/Platelet     Status: Abnormal   Collection Time: 10/06/19  7:22 AM  Result Value Ref Range   WBC 3.2 (L) 4.0 - 10.5 K/uL   RBC 3.39 (L) 3.87 - 5.11 MIL/uL   Hemoglobin 10.3 (L) 12.0 - 15.0 g/dL   HCT 32.5 (L) 36 - 46 %   MCV 95.9 80.0 - 100.0 fL   MCH 30.4 26.0 - 34.0 pg   MCHC 31.7 30.0 - 36.0 g/dL   RDW 15.9 (H) 11.5 - 15.5 %   Platelets 70 (L) 150 - 400 K/uL     Comment: PLATELET COUNT CONFIRMED BY SMEAR SPECIMEN CHECKED FOR CLOTS Immature Platelet Fraction may be clinically indicated, consider ordering this additional test [id number]    nRBC 0.0 0.0 - 0.2 %   Neutrophils Relative % 58 %   Neutro Abs 1.8 1.7 - 7.7 K/uL   Lymphocytes Relative 25 %   Lymphs Abs 0.8 0.7 - 4.0 K/uL   Monocytes Relative 12 %   Monocytes Absolute 0.4 0 - 1 K/uL   Eosinophils Relative 3 %   Eosinophils Absolute 0.1 0 - 0 K/uL   Basophils Relative 1 %   Basophils Absolute 0.0 0 - 0 K/uL   Immature Granulocytes 1 %   Abs Immature Granulocytes 0.02 0.00 - 0.07 K/uL    Comment: Performed at [facility], [place] [address], [place], [place] [address]  Comprehensive metabolic panel     Status: Abnormal   Collection Time: 10/06/19  7:22 AM  Result Value Ref Range   Sodium 134 (L) 135 - 145 mmol/L   Potassium 3.7 3.5 - 5.1 mmol/L   Chloride 102 98 - 111 mmol/L   CO2 24 22 - 32 mmol/L   Glucose, Bld 133 (H) 70 - 99 mg/dL    Comment: Glucose reference range applies only to samples taken after fasting for at least 8 hours.   BUN 12 6 - 20 mg/dL   Creatinine, Ser 0.58 0.44 - 1.00 mg/dL   Calcium 8.9 8.9 - 10.3 mg/dL   Total Protein 8.0 6.5 - 8.1 g/dL   Albumin 3.2 (L) 3.5 - 5.0 g/dL   AST 43 (H) 15 - 41 U/L   ALT 21 0 - 44 U/L   Alkaline Phosphatase 87 38 - 126 U/L   Total Bilirubin 3.6 (H) 0.3 - 1.2 mg/dL   GFR calc non Af Amer >60 >60 mL/min   GFR calc Af Amer >60 >60 mL/min   Anion gap 8 5 - 15    Comment: Performed at [facility], [place] [address] [place], [place] [address]  Vitamin B12     Status: Abnormal   Collection Time: 10/06/19  7:22 AM  Result Value Ref Range   Vitamin B-12 1,239 (H) 180 - 914 pg/mL    Comment: (NOTE) This assay is not validated for testing neonatal or myeloproliferative syndrome specimens for Vitamin B12 levels. Performed at [facility], [place] [address], [place], [place]  [address]   Folate     Status: None   Collection Time: 10/06/19  7:22 AM  Result Value Ref Range   Folate 50.1 >5.9 ng/mL    Comment: RESULTS CONFIRMED BY MANUAL DILUTION Performed at [place] [address] [place], [place] [address]   Iron and TIBC  Status: None   Collection Time: 10/06/19  7:22 AM  Result Value Ref Range   Iron 59 28 - 170 ug/dL   TIBC 423 250 - 450 ug/dL   Saturation Ratios 14 10.4 - 31.8 %   UIBC 364 ug/dL    Comment: Performed at [facility], [place] [address], [place], [place] [address]  Ferritin     Status: None   Collection Time: 10/06/19  7:22 AM  Result Value Ref Range   Ferritin 21 11 - 307 ng/mL    Comment: Performed at [facility], [place] [address], [place], [place] [address]  Reticulocytes     Status: Abnormal   Collection Time: 10/06/19  7:22 AM  Result Value Ref Range   Retic Ct Pct 3.9 (H) 0.4 - 3.1 %   RBC. 3.35 (L) 3.87 - 5.11 MIL/uL   Retic Count, Absolute 130.3 19.0 - 186.0 K/uL   Immature Retic Fract 17.6 (H) 2.3 - 15.9 %    Comment: Performed at [facility], [place] [address] [place], [place] [address]  Glucose, capillary     Status: Abnormal   Collection Time: 10/06/19 11:54 AM  Result Value Ref Range   Glucose-Capillary 273 (H) 70 - 99 mg/dL    Comment: Glucose reference range applies only to samples taken after fasting for at least 8 hours.   Comment 1 Notify RN    Comment 2 Document in Chart   Glucose, capillary     Status: Abnormal   Collection Time: 10/06/19  5:36 PM  Result Value Ref Range   Glucose-Capillary 279 (H) 70 - 99 mg/dL    Comment: Glucose reference range applies only to samples taken after fasting for at least 8 hours.   Comment 1 Notify RN    Comment 2 Document in Chart   Glucose, capillary     Status: Abnormal   Collection Time: 10/06/19  8:41 PM  Result Value Ref Range   Glucose-Capillary 312 (H) 70 - 99 mg/dL    Comment: Glucose  reference range applies only to samples taken after fasting for at least 8 hours.   Comment 1 Notify RN    Comment 2 Document in Chart   Glucose, capillary     Status: Abnormal   Collection Time: 10/07/19  5:57 AM  Result Value Ref Range   Glucose-Capillary 214 (H) 70 - 99 mg/dL    Comment: Glucose reference range applies only to samples taken after fasting for at least 8 hours.   Comment 1 Notify RN    Comment 2 Document in Chart     Blood Alcohol level:  Lab Results  Component Value Date   ETH <10 30/94/0768    Metabolic Disorder Labs: Lab Results  Component Value Date   HGBA1C 7.4 (H) 10/04/2019   MPG 165.68 10/04/2019   MPG 211.6 01/26/2019   No results found for: PROLACTIN No results found for: CHOL, TRIG, HDL, CHOLHDL, VLDL, LDLCALC  Physical Findings: AIMS: Facial and Oral Movements Muscles of Facial Expression: None, normal Lips and Perioral Area: None, normal Jaw: None, normal Tongue: None, normal,Extremity Movements Upper (arms, wrists, hands, fingers): None, normal Lower (legs, knees, ankles, toes): None, normal, Trunk Movements Neck, shoulders, hips: None, normal, Overall Severity Severity of abnormal movements (highest score from questions above): None, normal Incapacitation due to abnormal movements: None, normal Patient's awareness of abnormal movements (rate only patient's report): No Awareness, Dental Status Current problems with teeth and/or dentures?: No Does patient  usually wear dentures?: No  CIWA:    COWS:     Musculoskeletal: Strength & Muscle Tone: decreased Gait & Station: unsteady Patient leans: N/A  Psychiatric Specialty Exam: Physical Exam Vitals and nursing note reviewed.  HENT:     Head: Normocephalic and atraumatic.  Pulmonary:     Effort: Pulmonary effort is normal.  Neurological:     General: No focal deficit present.     Mental Status: She is alert and oriented to person, place, and time.     Review of Systems  Blood  pressure (!) 97/44, pulse 69, temperature 98.2 F (36.8 C), temperature source Oral, resp. rate 18, height 5' 3"  (1.6 m), weight 45.4 kg, SpO2 99 %.Body mass index is 17.71 kg/m.  General Appearance: Disheveled  Eye Contact:  Fair  Speech:  Normal Rate  Volume:  Decreased  Mood:  Anxious, Depressed and Dysphoric  Affect:  Congruent  Thought Process:  Coherent and Descriptions of Associations: Intact  Orientation:  Full (Time, Place, and Person)  Thought Content:  Rumination  Suicidal Thoughts:  No  Homicidal Thoughts:  No  Memory:  Immediate;   Fair Recent;   Fair Remote;   Fair  Judgement:  Intact  Insight:  Fair  Psychomotor Activity:  Decreased  Concentration:  Concentration: Fair and Attention Span: Fair  Recall:  [employer] of Knowledge:  Fair  Language:  Good  Akathisia:  Negative  Handed:  Right  AIMS (if indicated):     Assets:  Desire for Improvement Resilience  ADL's:  Intact  Cognition:  WNL  Sleep:  Number of Hours: 4.25     Treatment Plan Summary: Daily contact with patient to assess and evaluate symptoms and progress in treatment, Medication management and Plan : Patient is seen and examined.  Patient is a 54 year old female with the above-stated past medical and psychiatric history who is seen in follow-up.   Diagnosis: 1.  Major depression, recurrent 2.  Generalized anxiety disorder 3.  Failure to thrive 4.  Hepatic cirrhosis 5.  Chronic pancreatitis 6.  Diabetes mellitus type 2 7.  Weight loss 8.  Gait instability 9.  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  Thrombocytopenia secondary to hepatic cirrhosis 11.  Portal hypertension secondary to cirrhosis 12.  Anorexia 13.  Urinary tract infection with bacterial and fungal components 14.  Chronic pain 15.  Hypothyroidism 16.  Abdominal pain 17.  GERD 18.  Anemia of chronic disease  Pertinent findings on examination today: 1.  Still complains of fatigue, depressive symptoms,  anxiety and poor sleep 2.  Continues to complain of weakness and lethargy 3.  Continues to complain of abdominal pain which may be related to gastric, hepatic or pancreatic origins. 4.  Hyperglycemia  Plan: 1.  Continue amoxicillin 250 mg p.o. every 8 hours for a total of 7 days for urinary tract infection. 2.  Increase Abilify to 5 mg p.o. daily for mood stability. 3.  Continue Diflucan 100 mg p.o. daily for 7 days.  This is for urinary tract infection. 4.  Increase gabapentin to 200 mg p.o. 3 times daily for chronic pain and anxiety. 5.  Continue hydroxyzine 25 mg p.o. daily as needed anxiety. 6.  Increase fluoxetine to 20 mg p.o. daily for depression and anxiety. 7.  Continue folic acid 1 mg p.o. daily for nutritional supplementation. 8.  Continue sliding scale insulin. 9.  Continue NovoLog insulin 10 units subcu 3 times daily with meals. 10.  Increase Lantus insulin to  35 units subcu daily for diabetes mellitus type 2 11.  Continue DuoNeb nebulizations every 4 hours as needed wheezing for COPD. 12.  Continue lactulose 20 g p.o. twice daily for hyperammonemia as well as cirrhosis. 13.  Continue one-to-one given frailty. 14.  Continue levothyroxine 50 mcg p.o. daily for hypothyroidism. 15.  Continue Creon 24,000 units p.o. 3 times daily before meals for chronic pancreatitis. 16.  Continue Megace 40 mg p.o. daily for appetite stimulation. 17.  Increase mirtazapine to 15 mg p.o. nightly for mood and anxiety. 18.  Continue Zofran 4 mg p.o. every 6 hours as needed nausea and vomiting. 19.  Patient to be assisted out of bed 3 times daily for ambulation. 20.  Increase Protonix to 40 mg p.o. twice daily for gastric protection. 21. continue spironolactone 12.5 mg p.o. daily for ascites. 22.  Continue Zanaflex 4 mg p.o. every 8 hours as needed muscle spasms. 23.  Continue vitamin D 50,000 units p.o. q. Friday for vitamin D deficiency. 24.  Disposition planning-in progress-we will have social  work attempt to see if we can get her into a skilled nursing facility, if not hopefully home health care in her own home. Sharma Covert, MD 10/07/2019, 10:50 AM

## 2019-10-07 NOTE — Progress Notes (Signed)
Recreation Therapy Notes  Date:  7.19.20 Time: 0945 Location: [address] Group Room  Group Topic: Stress Management  Goal Area(s) Addresses:  Patient will identify positive stress management techniques. Patient will identify benefits of using stress management post d/c.  Behavioral Response: Engaged  Intervention: Stress Management   Activity : Meditation.  LRT played a meditation that focused on being resilient in the face of adversity.  Patients were to listen to meditation as it played to engage in activity.   Education:  Stress Management, Discharge Planning.   Education Outcome: Acknowledges Education  Clinical Observations/Feedback: Pt attended and participated in activity.    Victorino Sparrow, LRT/CTRS         Ria Comment, Makaiya Geerdes A 10/07/2019 11:20 AM

## 2019-10-07 NOTE — Progress Notes (Signed)
1:1 Nursing Note  D.  Pt resting in bed with eyes closed, respirations even and unlabored.  No distress noted.  A.  1:1 continued as ordered for Pt safety  R.  Pt remains safe on the unit.

## 2019-10-07 NOTE — Progress Notes (Signed)
Psychoeducational Group Note  Date:  10/07/2019 Time:  2212  Group Topic/Focus:  Wrap-Up Group:   The focus of this group is to help patients review their daily goal of treatment and discuss progress on daily workbooks.  Participation Level: Did Not Attend  Participation Quality:  Not Applicable  Affect:  Not Applicable  Cognitive:  Not Applicable  Insight:  Not Applicable  Engagement in Group: Not Applicable  Additional Comments:  The patient did not attend group.   Archie Balboa S 10/07/2019, 10:12 PM

## 2019-10-07 NOTE — Progress Notes (Signed)
1:1 Nursing Note  D.  Pt rested a bit better tonight having received Vistaril and Zanaflex with scheduled medications, 4.25 hours of sleep recorded.  No complaints voiced at this time.  A.  1:1 continued as ordered for Pt safety.  R.  Pt remains safe on the unit.

## 2019-10-07 NOTE — Progress Notes (Signed)
Spiritual care group on grief and loss facilitated by chaplain Decarlo Rivet  Group Goal:  Support / Education around grief and loss Members engage in facilitated group support and psycho-social education.  Group Description:  Following introductions and group rules, group members engaged in facilitated group dialog and support around topic of loss, with particular support around experiences of loss in their lives. Group Identified types of loss (relationships / self / things) and identified patterns, circumstances, and changes that precipitate losses. Reflected on thoughts / feelings around loss, normalized grief responses, and recognized variety in grief experience. Patient Progress: Did not attend  

## 2019-10-07 NOTE — Progress Notes (Signed)
1:1 note  Pt continues to be on 1:1 supervision.    Pt is resting at this time and appears to be in no distress.  RN will continue to monitor and provide assistance as needed.

## 2019-10-07 NOTE — Tx Team (Signed)
Interdisciplinary Treatment and Diagnostic Plan Update  10/07/2019 Time of Session: 1:35pm [name] MRN: [id number]  Principal Diagnosis: Severe recurrent major depressive disorder with psychotic features [facility])  Secondary Diagnoses: Principal Problem:   Severe recurrent major depressive disorder with psychotic features ([place]) Active Problems:   Depression   Major depressive disorder, single episode, severe with psychotic features ([place])   Current Medications:  Current Facility-Administered Medications  Medication Dose Route Frequency Provider Last Rate Last Admin  . albuterol (VENTOLIN HFA) 108 (90 Base) MCG/ACT inhaler 1-2 puff  1-2 puff Inhalation Q6H PRN Sharma Covert, MD      . alum & mag hydroxide-simeth (MAALOX/MYLANTA) 200-200-20 MG/5ML suspension 30 mL  30 mL Oral Q4H PRN Sharma Covert, MD      . amoxicillin (AMOXIL) capsule 250 mg  250 mg Oral Q8H Sharma Covert, MD   250 mg at 10/07/19 1449  . [START ON 10/08/2019] ARIPiprazole (ABILIFY) tablet 5 mg  5 mg Oral Daily Sharma Covert, MD      . empagliflozin (JARDIANCE) tablet 25 mg  25 mg Oral Daily Sharma Covert, MD   25 mg at 10/07/19 0921  . feeding supplement (GLUCERNA SHAKE) (GLUCERNA SHAKE) liquid 237 mL  237 mL Oral TID BM Nwoko, Agnes I, NP   237 mL at 10/07/19 1449  . fluconazole (DIFLUCAN) tablet 100 mg  100 mg Oral Daily Sharma Covert, MD   100 mg at 10/07/19 0918  . [START ON 10/08/2019] FLUoxetine (PROZAC) capsule 20 mg  20 mg Oral Daily Sharma Covert, MD      . folic acid (FOLVITE) tablet 1 mg  1 mg Oral Daily Sharma Covert, MD   1 mg at 10/07/19 0918  . gabapentin (NEURONTIN) capsule 200 mg  200 mg Oral TID Sharma Covert, MD   200 mg at 10/07/19 1210  . hydrOXYzine (ATARAX/VISTARIL) tablet 25 mg  25 mg Oral TID PRN Sharma Covert, MD   25 mg at 10/06/19 2124  . insulin aspart (novoLOG) injection 0-15 Units  0-15 Units Subcutaneous TID WC Sharma Covert, MD   5  Units at 10/07/19 1221  . insulin aspart (novoLOG) injection 10 Units  10 Units Subcutaneous TID WC Sharma Covert, MD   10 Units at 10/07/19 1222  . [START ON 10/08/2019] insulin glargine (LANTUS) injection 35 Units  35 Units Subcutaneous Daily Sharma Covert, MD      . ipratropium-albuterol (DUONEB) 0.5-2.5 (3) MG/3ML nebulizer solution 3 mL  3 mL Nebulization Q4H PRN Sharma Covert, MD      . lactulose (CHRONULAC) 10 GM/15ML solution 20 g  20 g Oral BID Sharma Covert, MD   20 g at 10/07/19 0924  . levothyroxine (SYNTHROID) tablet 50 mcg  50 mcg Oral Q0600 Sharma Covert, MD   50 mcg at 10/07/19 [phone]  . lipase/protease/amylase (CREON) capsule 24,000 Units  24,000 Units Oral TID AC Sharma Covert, MD   24,000 Units at 10/07/19 1208  . magnesium hydroxide (MILK OF MAGNESIA) suspension 30 mL  30 mL Oral Daily PRN Sharma Covert, MD      . megestrol (MEGACE) tablet 40 mg  40 mg Oral Daily Sharma Covert, MD   40 mg at 10/07/19 0092  . mirtazapine (REMERON) tablet 15 mg  15 mg Oral QHS Sharma Covert, MD      . ondansetron [facility]) tablet 4 mg  4 mg Oral Q6H PRN Lindell Spar  I, NP   4 mg at 10/06/19 2125  . pantoprazole (PROTONIX) EC tablet 40 mg  40 mg Oral Daily Sharma Covert, MD   40 mg at 10/07/19 0918  . tiZANidine (ZANAFLEX) tablet 4 mg  4 mg Oral Q8H PRN Sharma Covert, MD   4 mg at 10/06/19 2124  . Vitamin D (Ergocalciferol) (DRISDOL) capsule 50,000 Units  50,000 Units Oral Q Esmond Camper, MD   50,000 Units at 10/04/19 1236   PTA Medications: Medications Prior to Admission  Medication Sig Dispense Refill Last Dose  . albuterol (PROVENTIL) (2.5 MG/3ML) 0.083% nebulizer solution Inhale 2.5 mg into the lungs every 6 (six) hours as needed for wheezing or shortness of breath.      Marland Kitchen albuterol (VENTOLIN HFA) 108 (90 Base) MCG/ACT inhaler Inhale 2 puffs into the lungs every 4 (four) hours as needed for wheezing or shortness of breath.      .  cholecalciferol (VITAMIN D3) 25 MCG (1000 UNIT) tablet Take 1,000 Units by mouth daily.     Marland Kitchen CREON 24000-76000 units CPEP Take 24,000 Units by mouth 3 (three) times daily.      . diphenhydrAMINE (BENADRYL) 25 mg capsule Take 25 mg by mouth every 6 (six) hours as needed for itching.     . empagliflozin (JARDIANCE) 25 MG TABS tablet Take 25 mg by mouth daily.      . folic acid (FOLVITE) 080 MCG tablet Take 400 mcg by mouth daily. (Patient not taking: Reported on 10/03/2019)     . furosemide (LASIX) 20 MG tablet Take 20-40 mg by mouth See admin instructions. Take 20 mg by mouth in the morning and an additional 20 mg once daily as needed for fluid retention     . hydrOXYzine (ATARAX/VISTARIL) 25 MG tablet Take 2 tablets (50 mg total) by mouth at bedtime as needed for anxiety (insomnia). 30 tablet 0   . Insulin Glargine, 2 Unit Dial, (TOUJEO MAX SOLOSTAR) 300 UNIT/ML SOPN Inject 10 Units into the skin daily. (Patient taking differently: Inject 20 Units into the skin in the morning. ) 1 pen 1   . insulin lispro (HUMALOG KWIKPEN) 100 UNIT/ML KwikPen Inject 3-10 Units into the skin See admin instructions. Inject 3-10 units into the skin three times a day with meals, per sliding scale     . lactulose (CHRONULAC) 10 GM/15ML solution Take 20 g by mouth See admin instructions. Take 20 grams by mouth two times a day and hold for diarrhea     . levothyroxine (SYNTHROID, LEVOTHROID) 50 MCG tablet Take 50 mcg by mouth daily before breakfast.      . lipase/protease/amylase (CREON) 12000-38000 units CPEP capsule Take 12,000 Units by mouth See admin instructions. Take 12,000 units by mouth one to two times a day with snacks     . MAGNESIUM PO Take 1 tablet by mouth in the morning and at bedtime.     . melatonin 3 MG TABS tablet Take 3 mg by mouth at bedtime as needed (for sleep).     . ondansetron (ZOFRAN-ODT) 4 MG disintegrating tablet Take 4 mg by mouth 3 (three) times daily before meals. Dissolve 4 mg sublingually  three times a day before meals and snacks     . Oxycodone HCl 10 MG TABS Take 5 mg by mouth 4 (four) times daily as needed (for pain).     . OXYGEN Inhale 2 L/min into the lungs as needed (for shortness of breath).     Marland Kitchen  pantoprazole (PROTONIX) 40 MG tablet Take 40 mg by mouth daily before breakfast.      . potassium chloride (KLOR-CON) 8 MEQ tablet Take 8 mEq by mouth 2 (two) times daily.      Marland Kitchen tiZANidine (ZANAFLEX) 4 MG tablet Take 4 mg by mouth 3 (three) times daily as needed for muscle spasms.        Patient Stressors: Health problems Other: Assistance and support at home (cooking, meals)  Patient Strengths: Agricultural engineer for treatment/growth Supportive family/friends  Treatment Modalities: Medication Management, Group therapy, Case management,  1 to 1 session with clinician, Psychoeducation, Recreational therapy.   Physician Treatment Plan for Primary Diagnosis: Severe recurrent major depressive disorder with psychotic features ([place]) Long Term Goal(s): Improvement in symptoms so as ready for discharge Improvement in symptoms so as ready for discharge   Short Term Goals: Ability to identify changes in lifestyle to reduce recurrence of condition will improve Ability to verbalize feelings will improve Ability to demonstrate self-control will improve Ability to identify and develop effective coping behaviors will improve Ability to maintain clinical measurements within normal limits will improve Compliance with prescribed medications will improve  Medication Management: Evaluate patient's response, side effects, and tolerance of medication regimen.  Therapeutic Interventions: 1 to 1 sessions, Unit Group sessions and Medication administration.  Evaluation of Outcomes: Progressing  Physician Treatment Plan for Secondary Diagnosis: Principal Problem:   Severe recurrent major depressive disorder with psychotic features ([place]) Active Problems:   Depression    Major depressive disorder, single episode, severe with psychotic features ([place])  Long Term Goal(s): Improvement in symptoms so as ready for discharge Improvement in symptoms so as ready for discharge   Short Term Goals: Ability to identify changes in lifestyle to reduce recurrence of condition will improve Ability to verbalize feelings will improve Ability to demonstrate self-control will improve Ability to identify and develop effective coping behaviors will improve Ability to maintain clinical measurements within normal limits will improve Compliance with prescribed medications will improve     Medication Management: Evaluate patient's response, side effects, and tolerance of medication regimen.  Therapeutic Interventions: 1 to 1 sessions, Unit Group sessions and Medication administration.  Evaluation of Outcomes: Progressing   RN Treatment Plan for Primary Diagnosis: Severe recurrent major depressive disorder with psychotic features ([place]) Long Term Goal(s): Knowledge of disease and therapeutic regimen to maintain health will improve  Short Term Goals: Ability to remain free from injury will improve, Ability to verbalize frustration and anger appropriately will improve, Ability to identify and develop effective coping behaviors will improve and Compliance with prescribed medications will improve  Medication Management: RN will administer medications as ordered by provider, will assess and evaluate patient's response and provide education to patient for prescribed medication. RN will report any adverse and/or side effects to prescribing provider.  Therapeutic Interventions: 1 on 1 counseling sessions, Psychoeducation, Medication administration, Evaluate responses to treatment, Monitor vital signs and CBGs as ordered, Perform/monitor CIWA, COWS, AIMS and Fall Risk screenings as ordered, Perform wound care treatments as ordered.  Evaluation of Outcomes: Progressing   LCSW Treatment Plan  for Primary Diagnosis: Severe recurrent major depressive disorder with psychotic features ([place]) Long Term Goal(s): Safe transition to appropriate next level of care at discharge, Engage patient in therapeutic group addressing interpersonal concerns.  Short Term Goals: Engage patient in aftercare planning with referrals and resources, Increase social support, Identify triggers associated with mental health/substance abuse issues and Increase skills for wellness and recovery  Therapeutic Interventions: Assess  for all discharge needs, 1 to 1 time with Education officer, museum, Explore available resources and support systems, Assess for adequacy in community support network, Educate family and significant other(s) on suicide prevention, Complete Psychosocial Assessment, Interpersonal group therapy.  Evaluation of Outcomes: Progressing   Progress in Treatment: Attending groups: Yes. Participating in groups: Yes. Taking medication as prescribed: Yes. Toleration medication: Yes. Family/Significant other contact made: No, will contact:  no consent given Patient understands diagnosis: Yes. Discussing patient identified problems/goals with staff: Yes. Medical problems stabilized or resolved: No. Denies suicidal/homicidal ideation: Yes. Issues/concerns per patient self-inventory: No.   New problem(s) identified: No, Describe:  none.  New Short Term/Long Term Goal(s): medication stabilization, elimination of SI thoughts, development of comprehensive mental wellness plan.   Patient Goals:  "To get back on medicine, and get my anxiety under control"  Discharge Plan or Barriers: Patient recently admitted. CSW will continue to follow and assess for appropriate referrals and possible discharge planning.   Reason for Continuation of Hospitalization: Anxiety Depression Medication stabilization  Estimated Length of Stay: 1-3 days  Attendees: Patient: [name] 10/07/2019   Physician: Myles Lipps, MD  10/07/2019   Nursing:  10/07/2019   RN Care Manager: 10/07/2019   Social Worker: Darletta Moll, Joan 10/07/2019   Recreational Therapist:  10/07/2019   Other:  10/07/2019   Other:  10/07/2019   Other: 10/07/2019     Scribe for Treatment Team: Vassie Moselle, [place] 10/07/2019 3:44 PM

## 2019-10-07 NOTE — Progress Notes (Signed)
1:1 Nursing Note:  D.  Pt had been on a close observation while awake order, but Pt got up when she awakened and was observed by MHT on hall to nearly fall.  MHT that had been on close observation while awake was called back to sit with Pt and this Probation officer called NP and received order for 1:1 continuous observation.    A.  1:1 continued as ordered for Pt safety, MHT in place in room.  R.  Pt remains safe on the unit.

## 2019-10-07 NOTE — Progress Notes (Signed)
1:1 note  Pt remains on 1:1 supervision.  Pt walked down to cafeteria with MHT and got dinner.  Pt  Went to the dayroom after dinner and sat with the pt's in the dayroom to watch a movie.  Pt is in no distress at this time.

## 2019-10-07 NOTE — Progress Notes (Signed)
Pt ate cereal and biscuits and gravy for breakfast.  Her appetite is improving.  Her weight was 101.0 lbs this morning.  She is on a 1:1 with MHT.  Pt up, out of room, and attended group in dayroom.  Pt is calm and cooperative.  Taking medicines as ordered.  Tolerating well, without adverse affects.

## 2019-10-07 NOTE — BHH Group Notes (Signed)
Occupational Therapy Group Note Date: 10/07/2019 Group Topic/Focus: Communication Skills  Group Description: Group encouraged increased engagement and participation through interactive discussion focused on communication styles. Patients engaged in discussion identifying characteristics of all communication styles including passive, aggressive, passive-aggressive, and assertive communications. Patients were presented with various situations and role-played responses in each of the communication styles presented.  Participation Level: Active   Participation Quality: Independent   Behavior: Calm, Cooperative and Interactive   Speech/Thought Process: Directed and Focused   Affect/Mood: Euthymic   Insight: Moderate   Judgement: Moderate   Individualization: Leean was active and independent in her participation of discussion. Pt appeared interactive and engaged, offering relevant and appropriate contributions to group discussion. Pt identified being more of a passive communicator, however noted "When it comes to any of my pets, you'll see the aggressive side of me for sure. You better get out of my way." Appropriate, engaged for duration, receptive to education received.   Modes of Intervention: Discussion, Education and Role-play  Patient Response to Interventions:  Attentive, Engaged and Receptive   Plan: Continue to engage patient in OT groups 2 - 3x/week.  Ponciano Ort, MOT, OTR/L

## 2019-10-08 LAB — GLUCOSE, CAPILLARY
Glucose-Capillary: 155 mg/dL — ABNORMAL HIGH (ref 70–99)
Glucose-Capillary: 346 mg/dL — ABNORMAL HIGH (ref 70–99)
Glucose-Capillary: 235 mg/dL — ABNORMAL HIGH (ref 70–99)
Glucose-Capillary: 299 mg/dL — ABNORMAL HIGH (ref 70–99)

## 2019-10-08 NOTE — Progress Notes (Signed)
1:1 Note  Pt continues on 1:1 observation for safety due to her high fall risk and risk for bleeding. Pt is still awake and sitting on the edge of her bed. Pt c/o of abdominal pain for which she's been provided 2 heat packs and nausea for which her PRN zofran has been administered. Her respirations are even and unlabored. No distress has been observed. Sitter is readily available at the bedside for assistance with ambulation. Q 15 min safety checks also continue. Pt remains safe on the unit.

## 2019-10-08 NOTE — Progress Notes (Signed)
Recreation Therapy Notes  Animal-Assisted Activity (AAA) Program Checklist/Progress Notes Patient Eligibility Criteria Checklist & Daily Group note for Rec Tx Intervention  Date: 7.20.21 Time: 87 Location: [place] Room   AAA/T Program Assumption of Risk Form signed by Patient/ or Parent Legal Guardian  YES   Patient is free of allergies or sever asthma  YES   Patient reports no fear of animals  YES   Patient reports no history of cruelty to animals  YES  Patient understands his/her participation is voluntary  YES   Patient washes hands before animal contact  YES  Patient washes hands after animal contact  YES    Education: Contractor, Appropriate Animal Interaction   Education Outcome: Acknowledges understanding/In group clarification offered/Needs additional education.   Clinical Observations/Feedback: Pt did not attend group activity.     Victorino Sparrow, LRT/CTRS    Victorino Sparrow A 10/08/2019 3:29 PM

## 2019-10-08 NOTE — Progress Notes (Signed)
Pt c/o feeling light-headed upon ambulation this morning. Pt assisted by her sitter to a seat in the dayroom and encouraged to drink fluids. Pt was provided 240 mLs of gatorade to drink. Pt requested to go down to the cafeteria to get her breakfast and was educated about fall prevention. Pt was given the rationale that we need to ensure her safety since she's currently feeling light-headed. Pt verbally demonstrated understanding.

## 2019-10-08 NOTE — Progress Notes (Signed)
1:1 Note 1300  Patient concerned about potassium level, that her hands/fingers have been "locking up" and she can't use her fingers.  Fingers have been "stiff".    Patient ate 80% of her lunch and drank two apple juices.  Patient has slept good this morning because she only slept two hours last night.  Respirations even and unlabored.  No signs/symptoms of pain/distress noted on patient's face/body movements.  Patient denied SI and HI, contracts for safety.  Denied A/V hallucinations.   Patient attended afternoon group and went to sleep.  Patient using walker today.  1:1 continues for safety.

## 2019-10-08 NOTE — Progress Notes (Signed)
   10/08/19 1930  COVID-19 Daily Checkoff  Have you had a fever (temp > 37.80C/100F)  in the past 24 hours?  No  COVID-19 EXPOSURE  Have you traveled outside the state in the past 14 days? No  Have you been in contact with someone with a confirmed diagnosis of COVID-19 or PUI in the past 14 days without wearing appropriate PPE? No  Have you been living in the same home as a person with confirmed diagnosis of COVID-19 or a PUI (household contact)? No  Have you been diagnosed with COVID-19? No

## 2019-10-08 NOTE — Progress Notes (Signed)
1:1 Note  Pt continues on 1:1 observation for safety due to her high fall risk and risk for bleeding. Pt is currently sitting on the edge of her bed and is tearful because she was only able to get 1.25 hours of sleep last night. Active listening, reassurance, and support provided to pt. Pt thinks that she may have sleep apnea again and would like to get a sleep study done. Informed pt that I will pass along this request to oncoming shift. Pt encouraged to rest during the day as well. Q 15 min safety checks continue. Pt's safety has been maintained.

## 2019-10-08 NOTE — Progress Notes (Signed)
The patient learned about the [facility] today and about the groups that are offered. She also learned to keep pushing herself as well. Her goal for tomorrow is to go home.

## 2019-10-08 NOTE — Progress Notes (Signed)
1:1 Note 0800  Patient laying in bed.  1:1 present for safety.  Walker beside bed.  Patient stated she did not sleep well last night, less than 2 hours.  Patient denied SI and HI, contracts for safety.  Denied A/V hallucinations.  Stated she ate 100% of her breakfast.  Patient stated she needs to rest, feels very tired.  Respirations even and unlabored.  No signs/symptoms of pain/distress noted on patient's face/body movements.  1:1 continues per MD for patient safety.

## 2019-10-08 NOTE — BHH Counselor (Addendum)
CSW was secure chatted with scheduling support staff and Dr. Modesta Messing at [facility]. [facility] at this time due to severity of symptoms and history of multiple hospitalizations. CSW will seek more appropriate referral.

## 2019-10-08 NOTE — Progress Notes (Signed)
1:1 Note 1000  Patient in bed sleeping.  1:1 continues to be with patient for safety.  Wheelchair and walker beside her bed.  Respirations even and unlabored.  No signs/symptoms of pain/distress noted on patient's face/body movements.  Safety maintained with 1:1 per MD order.

## 2019-10-08 NOTE — Plan of Care (Signed)
Nurse discussed anxiety, depression and coping skills with patient.  

## 2019-10-08 NOTE — Progress Notes (Signed)
[facility] MD Progress Note  10/08/2019 9:32 AM [name]  MRN:  [id number] Subjective:  Patient reports " mentally I am doing well, my mood is OK". She expresses feeling frustrated due to her chronic medical illnesses. Denies SI. Currently denies medication side effects .  Objective: I have discussed case with treatment team and have met with patient. Patient is a 54 year old female, lives alone, reports history of chronic medical illnesses (cirrhosis, chronic pancreatitis, diabetes ). Of note, denies history of alcohol use disorder. She presented for worsening depression, anxiety, neurovegetative symptoms and passive SI.   Today patient reports she is feeling a lot better than on admission. States that " mentally I am doing well" and endorses improving mood, denies suicidal ideations, and presents future oriented, focusing on disposition , stating that she had been living alone prior to admission but that a friend of hers has agreed to move in with her for added support . At this time she is fully alert, attentive and is oriented x 3.  She is currently on one to one observation for safety/ fall risk. She ambulates with walker, and states that at home she ambulates with either a walker or in a wheel chair.  She complains of weakness /fatigue which she attributes to her chronic medical illness rather than to depression and describes mood as improved . Affect is reactive and smiles appropriately during session. Denies suicidal ideations . Denies medication side effects at this time. Limited milieu participation. CBG this AM is 155.   Principal Problem: Severe recurrent major depressive disorder with psychotic features ([place]) Diagnosis: Principal Problem:   Severe recurrent major depressive disorder with psychotic features ([place]) Active Problems:   Depression   Major depressive disorder, single episode, severe with psychotic features ([place])  Total Time spent with patient: 20 minutes  Past  Psychiatric History: See admission H&P  Past Medical History:  Past Medical History:  Diagnosis Date  . Degenerative joint disease   . Diabetes mellitus without complication ([place])   . Gastroesophageal reflux   . Pancreatitis   . Panic attack   . Sleep apnea   . Thrombocytopenia ([place])     Past Surgical History:  Procedure Laterality Date  . CHOLECYSTECTOMY    . SHOULDER SURGERY     left   . tongue sx. titanium screw placed to hold tongue down    . TOTAL THYROIDECTOMY     Family History: History reviewed. No pertinent family history. Family Psychiatric  History: See admission H&P Social History:  Social History   Substance and Sexual Activity  Alcohol Use No     Social History   Substance and Sexual Activity  Drug Use No    Social History   Socioeconomic History  . Marital status: Single    Spouse name: Not on file  . Number of children: Not on file  . Years of education: Not on file  . Highest education level: Not on file  Occupational History  . Not on file  Tobacco Use  . Smoking status: Never Smoker  . Smokeless tobacco: Never Used  Vaping Use  . Vaping Use: Never used  Substance and Sexual Activity  . Alcohol use: No  . Drug use: No  . Sexual activity: Not Currently  Other Topics Concern  . Not on file  Social History Narrative  . Not on file   Social Determinants of Health   Financial Resource Strain:   . Difficulty of Paying Living Expenses:   Food Insecurity:   .  Worried About Charity fundraiser in the Last Year:   . Arboriculturist in the Last Year:   Transportation Needs:   . Film/video editor (Medical):   Marland Kitchen Lack of Transportation (Non-Medical):   Physical Activity:   . Days of Exercise per Week:   . Minutes of Exercise per Session:   Stress:   . Feeling of Stress :   Social Connections:   . Frequency of Communication with Friends and Family:   . Frequency of Social Gatherings with Friends and Family:   . Attends Religious  Services:   . Active Member of Clubs or Organizations:   . Attends Archivist Meetings:   Marland Kitchen Marital Status:    Additional Social History:   Sleep: fair- improving  Appetite:  Fair  Current Medications: Current Facility-Administered Medications  Medication Dose Route Frequency Provider Last Rate Last Admin  . albuterol (VENTOLIN HFA) 108 (90 Base) MCG/ACT inhaler 1-2 puff  1-2 puff Inhalation Q6H PRN Sharma Covert, MD      . alum & mag hydroxide-simeth (MAALOX/MYLANTA) 200-200-20 MG/5ML suspension 30 mL  30 mL Oral Q4H PRN Sharma Covert, MD      . amoxicillin (AMOXIL) capsule 250 mg  250 mg Oral Q8H Sharma Covert, MD   250 mg at 10/08/19 7619  . ARIPiprazole (ABILIFY) tablet 5 mg  5 mg Oral Daily Sharma Covert, MD   5 mg at 10/08/19 0740  . empagliflozin (JARDIANCE) tablet 25 mg  25 mg Oral Daily Sharma Covert, MD   25 mg at 10/08/19 0739  . feeding supplement (GLUCERNA SHAKE) (GLUCERNA SHAKE) liquid 237 mL  237 mL Oral TID BM Lindell Spar I, NP   237 mL at 10/07/19 2126  . fluconazole (DIFLUCAN) tablet 100 mg  100 mg Oral Daily Sharma Covert, MD   100 mg at 10/08/19 0742  . FLUoxetine (PROZAC) capsule 20 mg  20 mg Oral Daily Sharma Covert, MD   20 mg at 10/08/19 0741  . folic acid (FOLVITE) tablet 1 mg  1 mg Oral Daily Sharma Covert, MD   1 mg at 10/08/19 0740  . gabapentin (NEURONTIN) capsule 200 mg  200 mg Oral TID Sharma Covert, MD   200 mg at 10/08/19 0740  . hydrOXYzine (ATARAX/VISTARIL) tablet 25 mg  25 mg Oral TID PRN Sharma Covert, MD   25 mg at 10/08/19 0310  . insulin aspart (novoLOG) injection 0-15 Units  0-15 Units Subcutaneous TID WC Sharma Covert, MD   3 Units at 10/08/19 [phone]  . insulin aspart (novoLOG) injection 10 Units  10 Units Subcutaneous TID WC Sharma Covert, MD   10 Units at 10/08/19 [phone]  . insulin glargine (LANTUS) injection 35 Units  35 Units Subcutaneous Daily Sharma Covert, MD   35 Units  at 10/08/19 [phone]  . ipratropium-albuterol (DUONEB) 0.5-2.5 (3) MG/3ML nebulizer solution 3 mL  3 mL Nebulization Q4H PRN Sharma Covert, MD      . lactulose (CHRONULAC) 10 GM/15ML solution 20 g  20 g Oral BID Sharma Covert, MD   20 g at 10/08/19 0739  . levothyroxine (SYNTHROID) tablet 50 mcg  50 mcg Oral Q0600 Sharma Covert, MD   50 mcg at 10/08/19 [phone]  . lipase/protease/amylase (CREON) capsule 24,000 Units  24,000 Units Oral TID AC Sharma Covert, MD   24,000 Units at 10/08/19 [phone]  . magnesium hydroxide (MILK OF MAGNESIA) suspension  30 mL  30 mL Oral Daily PRN Sharma Covert, MD      . megestrol (MEGACE) tablet 40 mg  40 mg Oral Daily Sharma Covert, MD   40 mg at 10/08/19 0740  . mirtazapine (REMERON) tablet 15 mg  15 mg Oral QHS Sharma Covert, MD   15 mg at 10/07/19 2126  . ondansetron (ZOFRAN) tablet 4 mg  4 mg Oral Q6H PRN Lindell Spar I, NP   4 mg at 10/08/19 0205  . pantoprazole (PROTONIX) EC tablet 40 mg  40 mg Oral Daily Sharma Covert, MD   40 mg at 10/08/19 0742  . tiZANidine (ZANAFLEX) tablet 4 mg  4 mg Oral Q8H PRN Sharma Covert, MD   4 mg at 10/08/19 0427  . Vitamin D (Ergocalciferol) (DRISDOL) capsule 50,000 Units  50,000 Units Oral Q Fri Clary, Greg Lawson, MD   50,000 Units at 10/04/19 1236    Lab Results:  Results for orders placed or performed during the hospital encounter of 10/04/19 (from the past 48 hour(s))  Glucose, capillary     Status: Abnormal   Collection Time: 10/06/19 11:54 AM  Result Value Ref Range   Glucose-Capillary 273 (H) 70 - 99 mg/dL    Comment: Glucose reference range applies only to samples taken after fasting for at least 8 hours.   Comment 1 Notify RN    Comment 2 Document in Chart   Glucose, capillary     Status: Abnormal   Collection Time: 10/06/19  5:36 PM  Result Value Ref Range   Glucose-Capillary 279 (H) 70 - 99 mg/dL    Comment: Glucose reference range applies only to samples taken after fasting for  at least 8 hours.   Comment 1 Notify RN    Comment 2 Document in Chart   Glucose, capillary     Status: Abnormal   Collection Time: 10/06/19  8:41 PM  Result Value Ref Range   Glucose-Capillary 312 (H) 70 - 99 mg/dL    Comment: Glucose reference range applies only to samples taken after fasting for at least 8 hours.   Comment 1 Notify RN    Comment 2 Document in Chart   Glucose, capillary     Status: Abnormal   Collection Time: 10/07/19  5:57 AM  Result Value Ref Range   Glucose-Capillary 214 (H) 70 - 99 mg/dL    Comment: Glucose reference range applies only to samples taken after fasting for at least 8 hours.   Comment 1 Notify RN    Comment 2 Document in Chart   Glucose, capillary     Status: Abnormal   Collection Time: 10/07/19 11:12 AM  Result Value Ref Range   Glucose-Capillary 241 (H) 70 - 99 mg/dL    Comment: Glucose reference range applies only to samples taken after fasting for at least 8 hours.  Glucose, capillary     Status: Abnormal   Collection Time: 10/07/19  4:54 PM  Result Value Ref Range   Glucose-Capillary 208 (H) 70 - 99 mg/dL    Comment: Glucose reference range applies only to samples taken after fasting for at least 8 hours.  Glucose, capillary     Status: Abnormal   Collection Time: 10/07/19  9:03 PM  Result Value Ref Range   Glucose-Capillary 303 (H) 70 - 99 mg/dL    Comment: Glucose reference range applies only to samples taken after fasting for at least 8 hours.   Comment 1 Notify RN  Glucose, capillary     Status: Abnormal   Collection Time: 10/07/19 11:44 PM  Result Value Ref Range   Glucose-Capillary 306 (H) 70 - 99 mg/dL    Comment: Glucose reference range applies only to samples taken after fasting for at least 8 hours.   Comment 1 Notify RN   Glucose, capillary     Status: Abnormal   Collection Time: 10/08/19  6:17 AM  Result Value Ref Range   Glucose-Capillary 155 (H) 70 - 99 mg/dL    Comment: Glucose reference range applies only to samples  taken after fasting for at least 8 hours.   Comment 1 Notify RN     Blood Alcohol level:  Lab Results  Component Value Date   ETH <10 37/06/8887    Metabolic Disorder Labs: Lab Results  Component Value Date   HGBA1C 7.4 (H) 10/04/2019   MPG 165.68 10/04/2019   MPG 211.6 01/26/2019   No results found for: PROLACTIN No results found for: CHOL, TRIG, HDL, CHOLHDL, VLDL, LDLCALC  Physical Findings: AIMS: Facial and Oral Movements Muscles of Facial Expression: None, normal Lips and Perioral Area: None, normal Jaw: None, normal Tongue: None, normal,Extremity Movements Upper (arms, wrists, hands, fingers): None, normal Lower (legs, knees, ankles, toes): None, normal, Trunk Movements Neck, shoulders, hips: None, normal, Overall Severity Severity of abnormal movements (highest score from questions above): None, normal Incapacitation due to abnormal movements: None, normal Patient's awareness of abnormal movements (rate only patient's report): No Awareness, Dental Status Current problems with teeth and/or dentures?: No Does patient usually wear dentures?: No  CIWA:    COWS:     Musculoskeletal: Strength & Muscle Tone: decreased Gait & Station: unsteady Patient leans: N/A  Psychiatric Specialty Exam: Physical Exam Vitals and nursing note reviewed.  HENT:     Head: Normocephalic and atraumatic.  Pulmonary:     Effort: Pulmonary effort is normal.  Neurological:     General: No focal deficit present.     Mental Status: She is alert and oriented to person, place, and time.     Review of Systems no chest pain, no shortness of breath at room air   Blood pressure (!) 100/50, pulse 76, temperature 98.3 F (36.8 C), temperature source Oral, resp. rate 18, height 5' 3" (1.6 m), weight 46.3 kg, SpO2 97 %.Body mass index is 18.07 kg/m.  General Appearance: Fairly Groomed  Eye Contact:  Good  Speech:  Normal Rate  Volume:  Decreased  Mood:  reports improving mood   Affect:   smiles briefly at times during session  Thought Process:  Linear and Descriptions of Associations: Intact  Orientation:  Full (Time, Place, and Person)  Thought Content:  no hallucinations, no delusions   Suicidal Thoughts:  No denies suicidal or self injurious ideations   Homicidal Thoughts:  No  Memory:  recent and remote grossly intact   Judgement:  Other:  improving   Insight:  Fair  Psychomotor Activity:  Decreased- comfortable in bed, no restlessness or agitation  Concentration:  Concentration: Good and Attention Span: Good  Recall:  Good  Fund of Knowledge:  Good  Language:  Good  Akathisia:  Negative  Handed:  Right  AIMS (if indicated):     Assets:  Desire for Improvement Resilience  ADL's:  Intact  Cognition:  WNL  Sleep:  Number of Hours: 1.25   Assessment -  Patient is a 54 year old female, lives alone, reports history of chronic medical illnesses (cirrhosis, chronic pancreatitis, diabetes ). Of  note, denies history of alcohol use disorder. She presented for worsening depression, anxiety, neurovegetative symptoms and passive SI.   Today patient is reporting improving mood and states that she is feeling better. She does express some persistent fatigue and weakness which she attributes to her chronic medical illnesses rather than to depression at this time . Denies SI and presents future oriented, states that a friend of hers will be moving in with her for support . Denies medication side effects.( On Abilify, Remeron, Neurontin)  Treatment Plan Summary: Daily contact with patient to assess and evaluate symptoms and progress in treatment, Medication management and Plan : Patient is seen and examined.  Patient is a 54 year old female with the above-stated past medical and psychiatric history who is seen in follow-up.    Plan: Treatment Plan reviewed as below today 7/20 Encourage group and milieu participation Treatment team working on disposition planning options- as  reviewed in chart notes, referral to SNF or return home with home health care were being considered.  Will request PT consult for evaluation of gait and functioning  1.  Continue amoxicillin 250 mg p.o. every 8 hours for a total of 7 days for urinary tract infection. 2.  Continue Abilify to 5 mg p.o. daily for mood stability. 3.  Continue Diflucan 100 mg p.o. daily for 7 days.  This is for urinary tract infection. 4.  Continue gabapentin to 200 mg p.o. 3 times daily for chronic pain and anxiety. 5.  Continue hydroxyzine 25 mg p.o. daily as needed anxiety. 6.  Continue  fluoxetine to 20 mg p.o. daily for depression and anxiety. 7.  Continue folic acid 1 mg p.o. daily for nutritional supplementation. 8.  Continue sliding scale insulin. 9.  Continue NovoLog insulin 10 units subcu 3 times daily with meals. 10. Continue Lantus insulin to 35 units subcu daily for diabetes mellitus type 2 11.  Continue DuoNeb nebulizations every 4 hours as needed wheezing for COPD. 12.  Continue lactulose 20 g p.o. twice daily for hyperammonemia as well as cirrhosis. 13.  Continue one-to-one given frailty. 14.  Continue levothyroxine 50 mcg p.o. daily for hypothyroidism. 15.  Continue Creon 24,000 units p.o. 3 times daily before meals for chronic pancreatitis. 16.  Continue Megace 40 mg p.o. daily for appetite stimulation. 17.  Continue mirtazapine 15 mg p.o. nightly for mood and anxiety. 18.  Continue Zofran 4 mg p.o. every 6 hours as needed nausea and vomiting. 19.  Patient to be assisted out of bed 3 times daily for ambulation. 20.  Continue  Protonix to 40 mg p.o. twice daily for gastric protection. 21. Continue spironolactone 12.5 mg p.o. daily for ascites. 22.  Continue Zanaflex 4 mg p.o. every 8 hours as needed muscle spasms. 23.  Continue vitamin D 50,000 units p.o. q. Friday for vitamin D deficiency. Check CBC, BMP, Ammonia in AM Jenne Campus, MD 10/08/2019, 9:32 AMPatient ID: [name],  female   DOB: 06-Dec-1965, 54 y.o.   MRN: [id number]

## 2019-10-08 NOTE — Progress Notes (Signed)
   10/07/19 1950  COVID-19 Daily Checkoff  Have you had a fever (temp > 37.80C/100F)  in the past 24 hours?  No  COVID-19 EXPOSURE  Have you traveled outside the state in the past 14 days? No  Have you been in contact with someone with a confirmed diagnosis of COVID-19 or PUI in the past 14 days without wearing appropriate PPE? No  Have you been living in the same home as a person with confirmed diagnosis of COVID-19 or a PUI (household contact)? No  Have you been diagnosed with COVID-19? No

## 2019-10-08 NOTE — Progress Notes (Signed)
1:1 Note  Pt continues on 1:1 observation for safety due to her high fall risk and risk for bleeding. Pt is currently sitting upright in her bed and is c/o anxiety. Pt said that listening to gospel music is one of her coping skills, so she will listen to some. Pt also shares that she continues to feel dizzy and light-headed when she ambulates so education about fall risk prevention was reinforced. PO fluids also continue to be encouraged. Pt's blood pressure tonight has been slightly elevated, 143/62 sitting. No distress has been noted. Respirations are even and unlabored. Pt is wearing her yellow non-skid socks and is ambulated with assistance of a walker/gait belt with 1:1 sitter. Q 15 min safety checks also continue. Pt's safety has been maintained.

## 2019-10-08 NOTE — Progress Notes (Signed)
Pt has had a hard time sleeping tonight and came up to the nurses station to share that she thinks she has sleep apnea. She reports that she used to use a CIPAP machine for sleep apnea up until she had a surgery where they "pulled my tongue forward" a few months ago. Pt said she fell asleep for a little bit tonight, but woke up because she felt like she was "choking." Pt is interested in getting a sleep study done to be evaluated. This Probation officer informed pt that she will pass on this information to oncoming shift.

## 2019-10-08 NOTE — Progress Notes (Signed)
1:1 Note 1800  Patient sitting in dayroom with 1:1 present.  Patient ate 90% of her dinner.  Stated she is feeling better emotionally than she did yesterday.  Patient she had faith to live on.  Regretted that she had  done the things she did while she was younger.  Knows that her health problems and liver problems are caused by her lifestyle.  Respirations even and unlabored.  No signs/symptoms of pain/distress noted on patient's face/body movements.  1:1 continues for safety per MD order.

## 2019-10-08 NOTE — Progress Notes (Signed)
1:1 Note 4:00 pm   Patient resting in her bed.  Respirations even and unlabored.  No signs/symptoms of pain/distress noted on patient's face/body movements.  1:1 continues for safety per MD order.

## 2019-10-08 NOTE — Progress Notes (Signed)
Pt reports that her appetite is no better and she has been seen drinking Dr. Malachi Bonds. She had a sugar free jello after 2 units of Novolog insulin was administered at 2218 for her CBG of 303 at 2103. Pt's goal is to "stay out of my room and smile more." Pt also reports that she has been getting compliments on her smiles and this has improved her mood. She shares that she's been participating in group more and was probably the one patient that talked the most. Pt was praised for her achievements and provided ongoing support/encouragement. She said that she has started to feel more like herself. Pt's CBG was rechecked at 2344 and it was 306. NP, Lindon Romp was notified. No new orders at this time. No distress has been observed in the pt. Q 15 min checks along with 1:1 sitter continues for safety due to high fall risk and risk for bleeding. Pt's safety has been maintained.    10/07/19 1950  Psych Admission Type (Psych Patients Only)  Admission Status Voluntary  Psychosocial Assessment  Patient Complaints Depression;Sadness;Anxiety  Eye Contact Fair  Facial Expression Anxious;Sad  Affect Appropriate to circumstance  Speech Logical/coherent  Interaction Assertive  Motor Activity Slow;Tremors;Other (Comment) (uses walker and gait belt with 1:1 sitter)  Appearance/Hygiene Unremarkable  Behavior Characteristics Cooperative;Appropriate to situation;Calm  Mood Depressed;Anxious;Pleasant  Thought Process  Coherency WDL  Content WDL  Delusions None reported or observed  Perception WDL  Hallucination None reported or observed  Judgment Poor  Confusion None  Danger to Self  Current suicidal ideation? Denies  Self-Injurious Behavior No self-injurious ideation or behavior indicators observed or expressed   Agreement Not to Harm Self Yes  Description of Agreement verbally contracts for safety  Danger to Others  Danger to Others None reported or observed

## 2019-10-09 LAB — BASIC METABOLIC PANEL
Anion gap: 7 (ref 5–15)
BUN: 13 mg/dL (ref 6–20)
CO2: 23 mmol/L (ref 22–32)
Calcium: 8.7 mg/dL — ABNORMAL LOW (ref 8.9–10.3)
Chloride: 104 mmol/L (ref 98–111)
Creatinine, Ser: 0.52 mg/dL (ref 0.44–1.00)
GFR calc Af Amer: >60 mL/min (ref >60)
GFR calc non Af Amer: >60 mL/min (ref >60)
Glucose, Bld: 166 mg/dL — ABNORMAL HIGH (ref 70–99)
Potassium: 3.7 mmol/L (ref 3.5–5.1)
Sodium: 134 mmol/L — ABNORMAL LOW (ref 135–145)

## 2019-10-09 LAB — CBC WITH DIFFERENTIAL/PLATELET
Abs Immature Granulocytes: 0.01 10*3/uL (ref 0.00–0.07)
Basophils Absolute: 0 10*3/uL (ref 0.0–0.1)
Basophils Relative: 1 %
Eosinophils Absolute: 0.1 10*3/uL (ref 0.0–0.5)
Eosinophils Relative: 2 %
HCT: 29 % — ABNORMAL LOW (ref 36.0–46.0)
Hemoglobin: 9.2 g/dL — ABNORMAL LOW (ref 12.0–15.0)
Immature Granulocytes: 0 %
Lymphocytes Relative: 17 %
Lymphs Abs: 0.5 10*3/uL — ABNORMAL LOW (ref 0.7–4.0)
MCH: 30.4 pg (ref 26.0–34.0)
MCHC: 31.7 g/dL (ref 30.0–36.0)
MCV: 95.7 fL (ref 80.0–100.0)
Monocytes Absolute: 0.4 10*3/uL (ref 0.1–1.0)
Monocytes Relative: 14 %
Neutro Abs: 1.9 10*3/uL (ref 1.7–7.7)
Neutrophils Relative %: 66 %
Platelets: 46 10*3/uL — ABNORMAL LOW (ref 150–400)
RBC: 3.03 MIL/uL — ABNORMAL LOW (ref 3.87–5.11)
RDW: 15.6 % — ABNORMAL HIGH (ref 11.5–15.5)
WBC: 3 10*3/uL — ABNORMAL LOW (ref 4.0–10.5)
nRBC: 0 % (ref 0.0–0.2)

## 2019-10-09 LAB — GLUCOSE, CAPILLARY
Glucose-Capillary: 215 mg/dL — ABNORMAL HIGH (ref 70–99)
Glucose-Capillary: 182 mg/dL — ABNORMAL HIGH (ref 70–99)
Glucose-Capillary: 233 mg/dL — ABNORMAL HIGH (ref 70–99)
Glucose-Capillary: 190 mg/dL — ABNORMAL HIGH (ref 70–99)

## 2019-10-09 LAB — AMMONIA: Ammonia: 84 umol/L — ABNORMAL HIGH (ref 9–35)

## 2019-10-09 NOTE — Progress Notes (Signed)
1:1 Note  Pt continues on 1:1 observation for safety due to her high fall risk and risk for bleeding. Pt reports feeling much better today and that her appetite has increased. She ate a little bit of her breakfast, but the majority of her lunch and dinner. She demonstrates understanding that she needs to improve her intake to gain weight. She said she got really sleepy at breakfast time and got some rest in then. She also took a nap before supper because she hadn't slept well last night. She is pleasant upon approach and has been participating in groups. She also spends some of her time in the dayroom as well. She continues to ambulate with a walker, gait belt, and assistance X 1 person for safety. Active listening, reassurance, and support provided to pt. Q 15 min safety checks continue. Pt's safety has been maintained.     10/08/19 1930  Psych Admission Type (Psych Patients Only)  Admission Status Voluntary  Psychosocial Assessment  Patient Complaints Anxiety;Depression  Eye Contact Fair  Facial Expression Other (Comment) (pleasant, appropriate)  Affect Appropriate to circumstance  Speech Logical/coherent  Interaction Assertive  Motor Activity Slow;Unsteady (uses walker, gait belt, and 1:1 Air cabin crew)  Appearance/Hygiene Unremarkable  Behavior Characteristics Cooperative;Appropriate to situation;Calm;Anxious  Mood Depressed;Anxious;Pleasant  Thought Process  Coherency WDL  Content WDL  Delusions None reported or observed  Perception WDL  Hallucination None reported or observed  Judgment Poor  Confusion None  Danger to Self  Current suicidal ideation? Denies  Self-Injurious Behavior No self-injurious ideation or behavior indicators observed or expressed   Agreement Not to Harm Self Yes  Description of Agreement verbally contracts for safety  Danger to Others  Danger to Others None reported or observed

## 2019-10-09 NOTE — Progress Notes (Signed)
1:1 Note  Pt continues on 1:1 observation for safety due to her high fall risk and risk for bleeding. Pt is currently up using the restroom with her 1:1 sitter for safety. She has her yellow non-skid socks on and her gait belt and walker is being used for assistance. Pt reports falling asleep, waking up, and then returning back to sleep. She also requested some ice which was provided. Her respirations are even and unlabored. No distress has been observed. Q 15 min safety checks also continue. Pt's safety has been maintained.

## 2019-10-09 NOTE — Progress Notes (Signed)
   10/09/19 0900  Psych Admission Type (Psych Patients Only)  Admission Status Voluntary  Psychosocial Assessment  Patient Complaints Anxiety  Eye Contact Fair  Facial Expression Other (Comment) (pleasant, appropriate)  Affect Appropriate to circumstance  Speech Logical/coherent  Interaction Assertive  Motor Activity Slow;Unsteady (uses walker, gait belt, and 1:1 Air cabin crew)  Appearance/Hygiene Unremarkable  Behavior Characteristics Cooperative;Appropriate to situation  Mood Pleasant;Anxious  Thought Process  Coherency WDL  Content WDL  Delusions None reported or observed  Perception WDL  Hallucination None reported or observed  Judgment Poor  Confusion None  Danger to Self  Current suicidal ideation? Denies  Self-Injurious Behavior No self-injurious ideation or behavior indicators observed or expressed   Agreement Not to Harm Self Yes  Description of Agreement verbally contracts for safety  Danger to Others  Danger to Others None reported or observed  Pt pleasant denies SI or HI at present still monitored 1:1 for falls. No falls this shift. Rated anxiety at 5 this am. Will continue to monitor.

## 2019-10-09 NOTE — Progress Notes (Signed)
1:1 Note  Pt continues on 1:1 observation for safety due to her high fall risk and risk for bleeding. Pt is currently resting comfortably with her eyes closed. Her respirations are even and unlabored. No distress has been observed. Q 15 min safety checks also continues. Pt's safety has been maintained.

## 2019-10-09 NOTE — Progress Notes (Signed)
1:1 Note  Pt continues on 1:1 observation for safety due to her high fall risk and risk for bleeding. Pt is currently resting in bed. Her respirations are even and unlabored. She was administered her PRN zofran at 0407 for nausea and pt reports that her nausea still hasn't decreased or subsided. Pt has been sucking on ice to try to alleviate it. No distress has been observed. Q 15 min safety checks also continue. Pt's safety has been maintained.

## 2019-10-09 NOTE — Progress Notes (Signed)
[facility] MD Progress Note  10/09/2019 2:48 PM PELAGIA IACOBUCCI  MRN:  [id number]  Subjective:  Mihira reports, "I'm doing really well. I'm starting to feel like myself again. I feel like I can handle things a little better than I was prior to coming here. I'm not depressed, just a little anxious, but I don't know why. I did not sleep well last night".  Objective: Patient is seen and examined.  Patient is a 54 year old female with an unspecified past psychiatric history who originally presented to the [facility] emergency department after her boyfriend dropped her off.  The patient stated that she was not sleeping well, she was depressed, having anxiety and panic attacks.  She stated she was upset over the fact that she was dying.  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  She also has a history of alcoholic liver disease.  The patient stated that she did not drink the past, but overdosed on Tylenol multiple times between teenage years to when she was in her 40s or 81s.  She also has a history of cirrhosis with portal hypertension, small volume abdominal ascites and previous admissions for hepatic encephalopathy.  She also has a history of diabetes.  She stated that she had last seen a psychiatrist a couple years ago.  She was seen in either [facility] or [facility] but did not like them.  She stated she moved from [place] to [place] approximately 3 years ago.  She stated that she had been admitted to the psychiatric hospital many times.  Today patient reports she is feeling a lot better than on admission. States that "mentally I am doing well" and endorses improving mood, denies suicidal ideations, and presents future oriented, focusing on disposition, stating that she had been living alone prior to admission but that a friend of hers has agreed to move in with her for added support. At this time she is fully alert, attentive and is oriented x 3.  She is  currently on 1:1 supervision for safety/fall risk due to generalized weakness, weak gait/balance. She ambulates with walker, and states that at home she ambulates with either a walker, cane or in a wheel chair. She says today that she is feeling more like herself again & feels like she can handle things better than when she first came to the hospital.  Her affect is reactive and smiles appropriately during session.Denies any symptoms of depression, denies any suicidal/homicidal ideations. Denies any medication side effects at this time. Limited milieu participation. Continues to complain of insomnia & anxiety issues. Slept for 5.5 hrs last night per documentation.   Principal Problem: Severe recurrent major depressive disorder with psychotic features ([place])  Diagnosis: Principal Problem:   Severe recurrent major depressive disorder with psychotic features ([place]) Active Problems:   Major depressive disorder, single episode, severe with psychotic features ([place])   Depression  Total Time spent with patient: 15 minutes  Past Psychiatric History: See admission H&P  Past Medical History:  Past Medical History:  Diagnosis Date   Degenerative joint disease    Diabetes mellitus without complication ([place])    Gastroesophageal reflux    Pancreatitis    Panic attack    Sleep apnea    Thrombocytopenia ([place])     Past Surgical History:  Procedure Laterality Date   CHOLECYSTECTOMY     SHOULDER SURGERY     left    tongue sx. titanium screw placed to hold tongue down  TOTAL THYROIDECTOMY     Family History: History reviewed. No pertinent family history.  Family Psychiatric  History: See admission H&P  Social History:  Social History   Substance and Sexual Activity  Alcohol Use No     Social History   Substance and Sexual Activity  Drug Use No    Social History   Socioeconomic History   Marital status: Single    Spouse name: Not on file   Number of children: Not on file    Years of education: Not on file   Highest education level: Not on file  Occupational History   Not on file  Tobacco Use   Smoking status: Never Smoker   Smokeless tobacco: Never Used  Vaping Use   Vaping Use: Never used  Substance and Sexual Activity   Alcohol use: No   Drug use: No   Sexual activity: Not Currently  Other Topics Concern   Not on file  Social History Narrative   Not on file   Social Determinants of Health   Financial Resource Strain:    Difficulty of Paying Living Expenses:   Food Insecurity:    Worried About Charity fundraiser in the Last Year:    Arboriculturist in the Last Year:   Transportation Needs:    Film/video editor (Medical):    Lack of Transportation (Non-Medical):   Physical Activity:    Days of Exercise per Week:    Minutes of Exercise per Session:   Stress:    Feeling of Stress :   Social Connections:    Frequency of Communication with Friends and Family:    Frequency of Social Gatherings with Friends and Family:    Attends Religious Services:    Active Member of Clubs or Organizations:    Attends Archivist Meetings:    Marital Status:    Additional Social History:   Sleep: Fair- improving (slept for about 5.5 hrs last night).  Appetite:  Fair  Current Medications: Current Facility-Administered Medications  Medication Dose Route Frequency Provider Last Rate Last Admin   albuterol (VENTOLIN HFA) 108 (90 Base) MCG/ACT inhaler 1-2 puff  1-2 puff Inhalation Q6H PRN Sharma Covert, MD       alum & mag hydroxide-simeth (MAALOX/MYLANTA) 200-200-20 MG/5ML suspension 30 mL  30 mL Oral Q4H PRN Sharma Covert, MD       amoxicillin (AMOXIL) capsule 250 mg  250 mg Oral Q8H Sharma Covert, MD   250 mg at 10/09/19 0615   ARIPiprazole (ABILIFY) tablet 5 mg  5 mg Oral Daily Sharma Covert, MD   5 mg at 10/09/19 0809   empagliflozin (JARDIANCE) tablet 25 mg  25 mg Oral Daily Sharma Covert, MD   25 mg at 10/09/19 0810   feeding supplement (GLUCERNA SHAKE) (GLUCERNA SHAKE) liquid 237 mL  237 mL Oral TID BM Lindell Spar I, NP   237 mL at 10/08/19 2119   fluconazole (DIFLUCAN) tablet 100 mg  100 mg Oral Daily Sharma Covert, MD   100 mg at 10/09/19 0809   FLUoxetine (PROZAC) capsule 20 mg  20 mg Oral Daily Sharma Covert, MD   20 mg at 32/95/18 8416   folic acid (FOLVITE) tablet 1 mg  1 mg Oral Daily Sharma Covert, MD   1 mg at 10/09/19 0809   gabapentin (NEURONTIN) capsule 200 mg  200 mg Oral TID Sharma Covert, MD   200 mg at 10/09/19  1156   hydrOXYzine (ATARAX/VISTARIL) tablet 25 mg  25 mg Oral TID PRN Sharma Covert, MD   25 mg at 10/08/19 2119   insulin aspart (novoLOG) injection 0-15 Units  0-15 Units Subcutaneous TID WC Sharma Covert, MD   3 Units at 10/09/19 1200   insulin aspart (novoLOG) injection 10 Units  10 Units Subcutaneous TID WC Sharma Covert, MD   10 Units at 10/09/19 1159   insulin glargine (LANTUS) injection 35 Units  35 Units Subcutaneous Daily Sharma Covert, MD   35 Units at 10/09/19 0810   ipratropium-albuterol (DUONEB) 0.5-2.5 (3) MG/3ML nebulizer solution 3 mL  3 mL Nebulization Q4H PRN Sharma Covert, MD       lactulose ([place]) 10 GM/15ML solution 20 g  20 g Oral BID Sharma Covert, MD   20 g at 10/09/19 0809   levothyroxine (SYNTHROID) tablet 50 mcg  50 mcg Oral Q0600 Sharma Covert, MD   50 mcg at 10/09/19 0615   lipase/protease/amylase (CREON) capsule 24,000 Units  24,000 Units Oral TID AC Sharma Covert, MD   24,000 Units at 10/09/19 1156   magnesium hydroxide (MILK OF MAGNESIA) suspension 30 mL  30 mL Oral Daily PRN Sharma Covert, MD       megestrol (MEGACE) tablet 40 mg  40 mg Oral Daily Sharma Covert, MD   40 mg at 10/09/19 0810   mirtazapine (REMERON) tablet 15 mg  15 mg Oral QHS Sharma Covert, MD   15 mg at 10/08/19 2119   ondansetron (ZOFRAN) tablet 4 mg   4 mg Oral Q6H PRN Lindell Spar I, NP   4 mg at 10/09/19 0407   pantoprazole (PROTONIX) EC tablet 40 mg  40 mg Oral Daily Sharma Covert, MD   40 mg at 10/09/19 0809   tiZANidine (ZANAFLEX) tablet 4 mg  4 mg Oral Q8H PRN Sharma Covert, MD   4 mg at 10/08/19 0427   Vitamin D (Ergocalciferol) (DRISDOL) capsule 50,000 Units  50,000 Units Oral Q Esmond Camper, MD   50,000 Units at 10/04/19 1236   Lab Results:  Results for orders placed or performed during the hospital encounter of 10/04/19 (from the past 48 hour(s))  Glucose, capillary     Status: Abnormal   Collection Time: 10/07/19  4:54 PM  Result Value Ref Range   Glucose-Capillary 208 (H) 70 - 99 mg/dL    Comment: Glucose reference range applies only to samples taken after fasting for at least 8 hours.  Glucose, capillary     Status: Abnormal   Collection Time: 10/07/19  9:03 PM  Result Value Ref Range   Glucose-Capillary 303 (H) 70 - 99 mg/dL    Comment: Glucose reference range applies only to samples taken after fasting for at least 8 hours.   Comment 1 Notify RN   Glucose, capillary     Status: Abnormal   Collection Time: 10/07/19 11:44 PM  Result Value Ref Range   Glucose-Capillary 306 (H) 70 - 99 mg/dL    Comment: Glucose reference range applies only to samples taken after fasting for at least 8 hours.   Comment 1 Notify RN   Glucose, capillary     Status: Abnormal   Collection Time: 10/08/19  6:17 AM  Result Value Ref Range   Glucose-Capillary 155 (H) 70 - 99 mg/dL    Comment: Glucose reference range applies only to samples taken after fasting for at least 8 hours.  Comment 1 Notify RN   Glucose, capillary     Status: Abnormal   Collection Time: 10/08/19 12:15 PM  Result Value Ref Range   Glucose-Capillary 346 (H) 70 - 99 mg/dL    Comment: Glucose reference range applies only to samples taken after fasting for at least 8 hours.  Glucose, capillary     Status: Abnormal   Collection Time: 10/08/19  4:47  PM  Result Value Ref Range   Glucose-Capillary 235 (H) 70 - 99 mg/dL    Comment: Glucose reference range applies only to samples taken after fasting for at least 8 hours.  Glucose, capillary     Status: Abnormal   Collection Time: 10/08/19  8:49 PM  Result Value Ref Range   Glucose-Capillary 299 (H) 70 - 99 mg/dL    Comment: Glucose reference range applies only to samples taken after fasting for at least 8 hours.   Comment 1 Notify RN   Glucose, capillary     Status: Abnormal   Collection Time: 10/09/19  5:51 AM  Result Value Ref Range   Glucose-Capillary 215 (H) 70 - 99 mg/dL    Comment: Glucose reference range applies only to samples taken after fasting for at least 8 hours.  CBC with Differential/Platelet     Status: Abnormal   Collection Time: 10/09/19  6:56 AM  Result Value Ref Range   WBC 3.0 (L) 4.0 - 10.5 K/uL   RBC 3.03 (L) 3.87 - 5.11 MIL/uL   Hemoglobin 9.2 (L) 12.0 - 15.0 g/dL   HCT 29.0 (L) 36 - 46 %   MCV 95.7 80.0 - 100.0 fL   MCH 30.4 26.0 - 34.0 pg   MCHC 31.7 30.0 - 36.0 g/dL   RDW 15.6 (H) 11.5 - 15.5 %   Platelets 46 (L) 150 - 400 K/uL    Comment: REPEATED TO VERIFY PLATELET COUNT CONFIRMED BY SMEAR SPECIMEN CHECKED FOR CLOTS Immature Platelet Fraction may be clinically indicated, consider ordering this additional test [id number]    nRBC 0.0 0.0 - 0.2 %   Neutrophils Relative % 66 %   Neutro Abs 1.9 1.7 - 7.7 K/uL   Lymphocytes Relative 17 %   Lymphs Abs 0.5 (L) 0.7 - 4.0 K/uL   Monocytes Relative 14 %   Monocytes Absolute 0.4 0 - 1 K/uL   Eosinophils Relative 2 %   Eosinophils Absolute 0.1 0 - 0 K/uL   Basophils Relative 1 %   Basophils Absolute 0.0 0 - 0 K/uL   Immature Granulocytes 0 %   Abs Immature Granulocytes 0.01 0.00 - 0.07 K/uL    Comment: Performed at [facility], [place] [address] [place], [place] [address]  Ammonia     Status: Abnormal   Collection Time: 10/09/19  6:56 AM  Result Value Ref Range   Ammonia 84 (H) 9 -  35 umol/L    Comment: Performed at [facility], [place] [address] [place], [place] [address]  Basic metabolic panel     Status: Abnormal   Collection Time: 10/09/19  6:56 AM  Result Value Ref Range   Sodium 134 (L) 135 - 145 mmol/L   Potassium 3.7 3.5 - 5.1 mmol/L   Chloride 104 98 - 111 mmol/L   CO2 23 22 - 32 mmol/L   Glucose, Bld 166 (H) 70 - 99 mg/dL    Comment: Glucose reference range applies only to samples taken after fasting for at least 8 hours.   BUN 13 6 -  20 mg/dL   Creatinine, Ser 0.52 0.44 - 1.00 mg/dL   Calcium 8.7 (L) 8.9 - 10.3 mg/dL   GFR calc non Af Amer >60 >60 mL/min   GFR calc Af Amer >60 >60 mL/min   Anion gap 7 5 - 15    Comment: Performed at [facility], [place] [address] [place], [place] [address]  Glucose, capillary     Status: Abnormal   Collection Time: 10/09/19 11:35 AM  Result Value Ref Range   Glucose-Capillary 182 (H) 70 - 99 mg/dL    Comment: Glucose reference range applies only to samples taken after fasting for at least 8 hours.   Blood Alcohol level:  Lab Results  Component Value Date   ETH <10 17/79/3903   Metabolic Disorder Labs: Lab Results  Component Value Date   HGBA1C 7.4 (H) 10/04/2019   MPG 165.68 10/04/2019   MPG 211.6 01/26/2019   No results found for: PROLACTIN No results found for: CHOL, TRIG, HDL, CHOLHDL, VLDL, LDLCALC  Physical Findings: AIMS: Facial and Oral Movements Muscles of Facial Expression: None, normal Lips and Perioral Area: None, normal Jaw: None, normal Tongue: None, normal,Extremity Movements Upper (arms, wrists, hands, fingers): None, normal Lower (legs, knees, ankles, toes): None, normal, Trunk Movements Neck, shoulders, hips: None, normal, Overall Severity Severity of abnormal movements (highest score from questions above): None, normal Incapacitation due to abnormal movements: None, normal Patient's awareness of abnormal movements (rate only patient's report): No  Awareness, Dental Status Current problems with teeth and/or dentures?: No Does patient usually wear dentures?: No  CIWA:    COWS:     Musculoskeletal: Strength & Muscle Tone: decreased Gait & Station: unsteady Patient leans: N/A  Psychiatric Specialty Exam: Physical Exam Vitals and nursing note reviewed.  HENT:     Head: Normocephalic and atraumatic.     Nose: Nose normal.     Mouth/Throat:     Pharynx: Oropharynx is clear.  Eyes:     Pupils: Pupils are equal, round, and reactive to light.  Neck:     Comments: Hx. Weak gait & balance Pulmonary:     Effort: Pulmonary effort is normal.  Genitourinary:    Comments: Deferred Musculoskeletal:        General: Normal range of motion.     Cervical back: Normal range of motion.  Skin:    General: Skin is warm and dry.  Neurological:     General: No focal deficit present.     Mental Status: She is alert and oriented to person, place, and time.     Review of Systems  Constitutional: Negative for chills, diaphoresis and fever.  HENT: Negative for congestion, rhinorrhea, sneezing and sore throat.   Eyes: Negative for discharge.  Respiratory: Negative for chest tightness, shortness of breath and wheezing.   Cardiovascular: Negative for chest pain and palpitations.  Gastrointestinal: Negative for diarrhea, nausea and vomiting.  Endocrine: Negative for cold intolerance.  Genitourinary: Negative for difficulty urinating.  Musculoskeletal: Negative for arthralgias and myalgias.   no chest pain, no shortness of breath at room air   Blood pressure (!) 140/48, pulse (!) 106, temperature (!) 100.8 F (38.2 C), temperature source Oral, resp. rate 18, height 5' 3"  (1.6 m), weight 46.3 kg, SpO2 97 %.Body mass index is 18.07 kg/m.  General Appearance: Fairly Groomed  Eye Contact:  Good  Speech:  Normal Rate  Volume:  Decreased  Mood:  reports improving mood   Affect:  smiles briefly at times during session  Thought Process:  Linear and  Descriptions of Associations: Intact  Orientation:  Full (Time, Place, and Person)  Thought Content:  no hallucinations, no delusions   Suicidal Thoughts:  No, denies suicidal or self injurious ideations   Homicidal Thoughts:  No  Memory:  recent and remote grossly intact   Judgement:  Other:  improving   Insight:  Fair  Psychomotor Activity:  Decreased- comfortable in bed, no restlessness or agitation  Concentration:  Concentration: Good and Attention Span: Good  Recall:  Good  Fund of Knowledge:  Good  Language:  Good  Akathisia:  Negative  Handed:  Right  AIMS (if indicated):     Assets:  Desire for Improvement Resilience  ADL's:  Intact  Cognition:  WNL  Sleep:  Number of Hours: 5.5   Assessment -  Patient is a 54 year old female, lives alone, reports history of chronic medical illnesses (cirrhosis, chronic pancreatitis, diabetes ). Of note, denies history of alcohol use disorder. She presented for worsening depression, anxiety, neurovegetative symptoms and passive SI.   Today patient is reporting improving mood and states that she is feeling better, denies any symptoms of depression. She does express some anxiety symptoms, says does not know the reason for the anxiety symptoms . Denies any SI and presents future oriented, states that a friend of hers will be moving in with her for support after discharge. Denies medication side effects. (On Abilify, Remeron, Neurontin)  Treatment Plan Summary: Daily contact with patient to assess and evaluate symptoms and progress in treatment, Medication management and Plan : Patient is seen and examined.  Patient is a 54 year old female with the above-stated past medical and psychiatric history who is seen in follow-up.   - Continue inpatient hospitalization. - Will continue today 10/06/2019 plan as below except where it is noted.  Mood control/adjunct to antidepressant.     - Continue Abilify 5 mg po daily.  Depression/anxiety    -  Continue Prozac 20 mg po daily.    - Continue Vistaril 25 mg po tid prn.  Agitation/diabetic neuropathy.    - Continue gabapentin 200 mg po tid.  Insomnia/depression.    - Continue Mirtazapine 15 mg po Q hs.  Other medical issues.    - Continue Amoxil 250 mg po tid for infection.    - Continue Jardiance 25 mg po po daily for diabetes management.     - Continue albuterol inhaler 1-2 puffs Q 6 hrs prn for SOB.     - Continue Diflucan 100 mg po daily for infection.     - Continue Folic acid 1 mg po daily for nutritional supplement.     - Continue sliding scale insulin coverage with Novolog per BS results.     - Continue Novolog insulin 10 units tif for meal coverage.     - Continue Lantus insulin 35 units Sub Q daily for DM management.     - Continue Duoneb 3 ml per inhalation Q 4 hrs prn for SOB.     - Continue Chronulac 20 gm po bid for increased ammonium level.     - Continue synThyroid 50 mcg po daily for hypothyroidism.     - Continue Creon 24,000 units tid for pancreatitis.     - Continue Megace 40 mg po daily for nutritional supplement.     - Continue Zofran 4 mg po Q 4 hrs for n/v.     - Continue Protonix 40 mg po for GERD.     - Continue  Aldactone 12.5 mg po daily for HTN.     - Continue Zanaflex 4 mg po Q 8 hrs prn for muscle spasms.     - Continue Vit D 50, 000 units daily for bone health.  - Continue 1:1 supervision due to generalized weakness. - Patient uses the walker to aid mobility. - Encourage group participation. - Discharge disposition in progress. - Continue 1:1 supervision for safety.  Check CBC, BMP, Ammonia in AM  Lindell Spar, NP, PMHNP, FNP-BC. 10/09/2019, 2:48 PMPatient ID: [name], female   DOB: 1965-10-31, 54 y.o.   MRN: [id number] Patient ID: STEVIE ERTLE, female   DOB: 1965-10-28, 54 y.o.   MRN: [id number]

## 2019-10-09 NOTE — Progress Notes (Signed)
Talbot Grumbling, NP informed that pt's temperature at [phone] was 100.6 degrees F (oral). BP sitting was 134/61 and pulse 116. BP standing was 127/63 and pulse 122. Temperature was rechecked at 0634 and was 99.5 degrees F (oral).Pt's skin is warm to the touch. NP ordered to continue to monitor her vital signs.

## 2019-10-09 NOTE — [facility] Counselor (Signed)
CSW met with pt to discuss [facility] and outpatient referral. Pt explains she is active with [facility]. CSW then explains that [facility] in [place] unable to accept pt at this time and since pt does not want [facility], [facility] in [place] would be another option. Pt explains that [facility] is too far. CSW inquires further about why pt doesn't want [facility]. Pt explains she had a negative experience with previous psychiatrist. CSW inquires if pt is okay with [facility] if she is able to be seen by a different psychiatrist. Pt is agreeable.   CSW calls [facility] and confirms pt active with HH RN/PT/OT. Ronalee Belts from [facility] also states they can add SW services.

## 2019-10-09 NOTE — Progress Notes (Signed)
The patient verbalized that she had a good day and that she had physical therapy as well. Her goal for tomorrow is to go home.

## 2019-10-09 NOTE — Progress Notes (Signed)
Physical Therapy Treatment Patient Details Name: [name] MRN: [id number] DOB: 1965/11/10 Today's Date: 10/09/2019    History of Present Illness pt with multiple hosptial and ED visits with major depressive episode with SI  and uncontrolled DM with high sugar levels.    PT Comments    Patient is functioning close to baseline level of independence but remains limited by weakness and mild gait balance impairments. She was able to ambulate ~140' this date with supervision using RW with no reports of SOB or lightheadedness. Patient educated on exercises for functional LE strengthening. She will continue to benefit from skilled PT interventions during inpatient stay. Patient was receiving home health services prior to admission and would benefit from continued home services for PT/OT as well as a home health aid.    Follow Up Recommendations  Home health PT (pt was active with [facility] for HHPT/HHOT, looking for aid)     Equipment Recommendations  None recommended by PT    Recommendations for Other Services       Precautions / Restrictions Precautions Precautions: None Restrictions Weight Bearing Restrictions: No    Mobility  Bed Mobility Overal bed mobility: Independent           Transfers Overall transfer level: Modified independent Equipment used: Rolling walker (2 wheeled)             General transfer comment: no assist required. pt steady with rising  Ambulation/Gait Ambulation/Gait assistance: Min guard;Supervision Gait Distance (Feet): 140 Feet Assistive device: Rolling walker (2 wheeled) Gait Pattern/deviations: Step-through pattern;Decreased step length - right;Decreased step length - left;Decreased stride length;Trunk flexed Gait velocity: decr   General Gait Details: pt amb with min guard/supervision with RW. no overt LOB noted. pt requried VCs for flexed posture.   Stairs             Wheelchair Mobility    Modified Rankin (Stroke  Patients Only)       Balance Overall balance assessment: Needs assistance Sitting-balance support: Feet supported Sitting balance-Leahy Scale: Good     Standing balance support: During functional activity;Bilateral upper extremity supported Standing balance-Leahy Scale: Fair               Cognition Arousal/Alertness: Awake/alert Behavior During Therapy: WFL for tasks assessed/performed Overall Cognitive Status: Within Functional Limits for tasks assessed               Exercises Other Exercises Other Exercises: 10x sit<>stand from EOB with single UE support for power up Other Exercises: 2x10 heel raise in standing. Other Exercises: 10x marching in standing bil LE    General Comments        Pertinent Vitals/Pain Pain Assessment: No/denies pain           PT Goals (current goals can now befound in the care plan section) Acute Rehab PT Goals Patient Stated Goal: I want to get stronger PT Goal Formulation: With patient Potential to Achieve Goals: Good Progress towards PT goals: Progressing toward goals    Frequency    Min 2X/week      PT Plan Current plan remains appropriate       AM-PAC PT "6 Clicks" Mobility   Outcome Measure  Help needed turning from your back to your side while in a flat bed without using bedrails?: None Help needed moving from lying on your back to sitting on the side of a flat bed without using bedrails?: None Help needed moving to and from a bed to a chair (including a  wheelchair)?: None Help needed standing up from a chair using your arms (e.g., wheelchair or bedside chair)?: A Little Help needed to walk in hospital room?: A Little Help needed climbing 3-5 steps with a railing? : A Little 6 Click Score: 21    End of Session Equipment Utilized During Treatment: Gait belt Activity Tolerance: Patient tolerated treatment well Patient left: in bed;with nursing/sitter in room Nurse Communication: Mobility status PT Visit  Diagnosis: Unsteadiness on feet (R26.81);Muscle weakness (generalized) (M62.81)     Time: 0992-7800 PT Time Calculation (min) (ACUTE ONLY): 23 min  Charges:  $Gait Training: 8-22 mins $Therapeutic Exercise: 8-22 mins                    Verner Mould, DPT Acute Rehabilitation Services  Office [phone] Pager [phone]  10/09/2019 7:10 PM

## 2019-10-10 LAB — GLUCOSE, CAPILLARY: Glucose-Capillary: 218 mg/dL — ABNORMAL HIGH (ref 70–99)

## 2019-10-10 MED ORDER — AMOXICILLIN 250 MG PO CAPS: 250.0000 mg | ORAL_CAPSULE | Freq: Three times a day (TID) | ORAL | Status: DC

## 2019-10-10 MED ORDER — FLUOXETINE HCL 20 MG PO CAPS: 20.0000 mg | ORAL_CAPSULE | Freq: Every day | ORAL | 0 refills | Status: DC

## 2019-10-10 MED ORDER — ARIPIPRAZOLE 5 MG PO TABS: 5.0000 mg | ORAL_TABLET | Freq: Every day | ORAL | 0 refills | Status: DC

## 2019-10-10 MED ORDER — HYDROXYZINE HCL 25 MG PO TABS: 25.0000 mg | ORAL_TABLET | Freq: Three times a day (TID) | ORAL | 0 refills | Status: DC | PRN

## 2019-10-10 MED ORDER — INSULIN GLARGINE 100 UNIT/ML ~~LOC~~ SOLN: 35.0000 [IU] | Freq: Every day | SUBCUTANEOUS | 0 refills | Status: DC

## 2019-10-10 MED ORDER — MEGESTROL ACETATE 40 MG PO TABS: 40.0000 mg | ORAL_TABLET | Freq: Every day | ORAL | 0 refills | Status: DC

## 2019-10-10 MED ORDER — GABAPENTIN 100 MG PO CAPS: 200.0000 mg | ORAL_CAPSULE | Freq: Three times a day (TID) | ORAL | 0 refills | Status: DC

## 2019-10-10 MED ORDER — MIRTAZAPINE 15 MG PO TABS: 15.0000 mg | ORAL_TABLET | Freq: Every day | ORAL | 0 refills | Status: DC

## 2019-10-10 MED ORDER — INSULIN ASPART 100 UNIT/ML ~~LOC~~ SOLN: 10.0000 [IU] | Freq: Three times a day (TID) | SUBCUTANEOUS | 0 refills | Status: DC

## 2019-10-10 MED ORDER — FLUCONAZOLE 100 MG PO TABS: 100.0000 mg | ORAL_TABLET | Freq: Every day | ORAL | 0 refills | Status: DC

## 2019-10-10 NOTE — Progress Notes (Signed)
Discharge note:  Patient discharged home per MD order.  Patient received all personal belongings from locker and unit.  She denies any thoughts of self harm. Reviewed AVS/transition record with patient and she indicates understanding.  Patient will follow up with [facility] in [place].   Patient denies any thoughts of self harm. She left ambulatory with a friend.

## 2019-10-10 NOTE — BHH Suicide Risk Assessment (Signed)
[facility] Discharge Suicide Risk Assessment   Principal Problem: Severe recurrent major depressive disorder with psychotic features [facility]) Discharge Diagnoses: Principal Problem:   Severe recurrent major depressive disorder with psychotic features ([place]) Active Problems:   Depression   Major depressive disorder, single episode, severe with psychotic features ([place])   Total Time spent with patient: 30 minutes  Musculoskeletal: Strength & Muscle Tone: within normal limits Gait & Station: reports she feels gait has improved partially since admission Patient leans: N/A  Psychiatric Specialty Exam: Review of Systems denies headache, denies chest pain, no shortness of breath, no vomiting  Blood pressure (!) 145/72, pulse (!) 106, temperature 99.6 F (37.6 C), temperature source Oral, resp. rate 18, height 5' 3"  (1.6 m), weight 47.2 kg, SpO2 97 %.Body mass index is 18.42 kg/m.  General Appearance: Casual  Eye Contact::  Good  Speech:  Normal Rate409  Volume:  Normal  Mood:  reports improved mood, states she is feeling betteer  Affect:  reactive, appropriate, smiles at times appropriately during session  Thought Process:  Linear and Descriptions of Associations: Intact  Orientation:  Full (Time, Place, and Person)  Thought Content:  no hallucinations, no delusions, not internally preoccupied   Suicidal Thoughts:  No denies suicidal or self injurious ideations, denies homicidal or violent ideations   Homicidal Thoughts:  No  Memory:  recent and remote grossly intact   Judgement:  Other:  improving  Insight:  fair- improving   Psychomotor Activity:  Normal- no current psychomotor agitation or restlessness   Concentration:  Good  Recall:  Good  Fund of Knowledge:Good  Language: Good  Akathisia:  Negative  Handed:  Right  AIMS (if indicated):     Assets:  Communication Skills Desire for Improvement Resilience  Sleep:  Number of Hours: 5.5  Cognition: WNL  ADL's:  Intact   Mental Status  Per Nursing Assessment::   On Admission:  Self-harm thoughts ("before coming here")  Demographic Factors:  88 y old female  Loss Factors: History of chronic medical illness , was not taking psychiatric medications prior to admission  Historical Factors: History of depression, history of chronic medical illnesses   Risk Reduction Factors:   Positive coping skills or problem solving skills  Continued Clinical Symptoms:  Today patient presents alert, attentive, calm, polite/pleasant on approach.  Reports her mood is "good" and denies feeling depressed at this time.  Affect is appropriate and reactive.  No thought disorders noted.  Denies suicidal ideations.  Denies homicidal ideations.  No hallucinations.  No delusions are expressed.  She is oriented x3.  She is currently future oriented and hopeful for discharge.  Behavior on unit in good control, no disruptive or agitated behaviors.  During this admission she has been on one-to-one precautions for fall risk. Denies medication side effects. 7/21 labs- patient has chronic pancytopenia ( WBC 3.0, Hgb9.2, ANC 1.9, PLT 46). Ammonia has been elevated ( 75, 84)- is on lactulose- has not presented with delirium or encephalopathy. CBG today 218    Cognitive Features That Contribute To Risk:  No gross cognitive deficits noted upon discharge. Is alert , attentive, and oriented x 3   Suicide Risk:  Mild:  Suicidal ideation of limited frequency, intensity, duration, and specificity.  There are no identifiable plans, no associated intent, mild dysphoria and related symptoms, good self-control (both objective and subjective assessment), few other risk factors, and identifiable protective factors, including available and accessible social support.   Follow-up Information    Services, [facility].  Go on 10/11/2019.   Why: You have an appointment on 10/11/19 at 8:30 am for therapy and medication services. They will set an appointment with a new  provider for therapy services at that time. Contact information: [place] [address] [phone]               Plan Of Care/Follow-up recommendations:  Activity:  as tolerated  Diet:  diabetic diet  Tests:  NA Other:  See below  Patient is expressing readiness for discharge and is requesting discharge- she  leaving unit in good spirits, planning to return home. There are no current grounds for involuntary commitment  She plans to follow up as above and to follow up with her Cardiologist and her PCP, Dr. Laurin Coder. She is aware of history of chronic illness and lab abnormalities as above and expresses understanding of importance of following up regularly with her outpatient MD for monitoring and management, agrees to go to ED if worsening or new symptoms. Home Health referral/ requests have been placed .  Jenne Campus, MD 10/10/2019, 11:01 AM

## 2019-10-10 NOTE — Progress Notes (Signed)
1:1 note: Patient observed in dayroom with walker. She is ambulating well.  She remains a high fall risk. Patient is scheduled to be discharged today.  She denies any thoughts of self harm.  Initiate paperwork for patient for discharge.

## 2019-10-10 NOTE — Progress Notes (Signed)
  [facility] Adult Case Management Discharge Plan :  Will you be returning to the same living situation after discharge:  Yes,  home At discharge, do you have transportation home?: Yes,   significant other Sam Do you have the ability to pay for your medications: Yes,  medicare and medicaid  Release of information consent forms completed and in the chart;  Patient's signature needed at discharge.  Patient to Follow up at:  Follow-up Information    Services, [facility]. Go on 10/11/2019.   Why: You have an appointment on 10/11/19 at 8:30 am for therapy and medication services. They will set an appointment with a new provider for therapy services at that time. Contact information: [place] [address] [phone]               Next level of care provider has access to [place] and Suicide Prevention discussed: Yes,  yes  Have you used any form of tobacco in the last 30 days? (Cigarettes, Smokeless Tobacco, Cigars, and/or Pipes): No  Has patient been referred to the Quitline?: Patient refused referral  Patient has been referred for addiction treatment: [place], LCSW 10/10/2019, 9:27 AM

## 2019-10-10 NOTE — Plan of Care (Signed)
Remains on 1:1 for falls, she has been cooperative with treatment, there has been some improvement in her mood but she still requires further stabilization.

## 2019-10-10 NOTE — Plan of Care (Signed)
  Problem: Education: Goal: Knowledge of [facility] Education information/materials will improve Outcome: Completed/Met Goal: Emotional status will improve Outcome: Completed/Met Goal: Mental status will improve Outcome: Completed/Met Goal: Verbalization of understanding the information provided will improve Outcome: Completed/Met   Problem: Activity: Goal: Interest or engagement in activities will improve Outcome: Completed/Met Goal: Sleeping patterns will improve Outcome: Completed/Met   Problem: Coping: Goal: Ability to verbalize frustrations and anger appropriately will improve Outcome: Completed/Met Goal: Ability to demonstrate self-control will improve Outcome: Completed/Met   Problem: Health Behavior/Discharge Planning: Goal: Identification of resources available to assist in meeting health care needs will improve Outcome: Completed/Met Goal: Compliance with treatment plan for underlying cause of condition will improve Outcome: Completed/Met   Problem: Physical Regulation: Goal: Ability to maintain clinical measurements within normal limits will improve Outcome: Completed/Met   Problem: Safety: Goal: Periods of time without injury will increase Outcome: Completed/Met   Problem: Education: Goal: Utilization of techniques to improve thought processes will improve Outcome: Completed/Met Goal: Knowledge of the prescribed therapeutic regimen will improve Outcome: Completed/Met   Problem: Activity: Goal: Interest or engagement in leisure activities will improve Outcome: Completed/Met Goal: Imbalance in normal sleep/wake cycle will improve Outcome: Completed/Met   Problem: Health Behavior/Discharge Planning: Goal: Ability to make decisions will improve Outcome: Completed/Met Goal: Compliance with therapeutic regimen will improve Outcome: Completed/Met   Problem: Role Relationship: Goal: Will demonstrate positive changes in social behaviors and  relationships Outcome: Completed/Met   Problem: Safety: Goal: Ability to disclose and discuss suicidal ideas will improve Outcome: Completed/Met Goal: Ability to identify and utilize support systems that promote safety will improve Outcome: Completed/Met   Problem: Self-Concept: Goal: Level of anxiety will decrease Outcome: Completed/Met   Problem: Activity: Goal: Will identify at least one activity in which they can participate Outcome: Completed/Met   Problem: Health Behavior/Discharge Planning: Goal: Identification of resources available to assist in meeting health care needs will improve Outcome: Completed/Met   Problem: Self-Concept: Goal: Will verbalize positive feelings about self Outcome: Completed/Met  Will discharge home per MD. Initiate discharge paperwork for patient to review.

## 2019-10-10 NOTE — Progress Notes (Signed)
Patient remains on 1:1 for falls with sitter at her side, she spent most of the evening in the dayroom with peers, she remains flat, sad and depressed. Her night time Glucerna was not stocked in the [facility], charge nurse was notified. Patient complained shortly after 2 am of not being able to sleep no PRN to give for sleep instructed patient we will follow up with MD in the AM. Patient is currently resting in bed quietly.

## 2019-10-10 NOTE — Discharge Summary (Addendum)
Physician Discharge Summary Note  Patient:  [name] is an 54 y.o., female MRN:  [id number] DOB:  [birth year] Patient phone:  [phone] (home)  Patient address:   [place] [address],  Total Time spent with patient: Greater than 30 minutes  Date of Admission:  10/04/2019  Date of Discharge: 10-10-19  Reason for Admission: Suicidal ideations.  Principal Problem: Severe recurrent major depressive disorder with psychotic features [facility])  Discharge Diagnoses: Principal Problem:   Severe recurrent major depressive disorder with psychotic features ([place]) Active Problems:   Major depressive disorder, single episode, severe with psychotic features ([place])   Depression  Past Psychiatric History: MDD, recurrent  Past Medical History:  Past Medical History:  Diagnosis Date   Degenerative joint disease    Diabetes mellitus without complication ([place])    Gastroesophageal reflux    Pancreatitis    Panic attack    Sleep apnea    Thrombocytopenia ([facility])     Past Surgical History:  Procedure Laterality Date   CHOLECYSTECTOMY     SHOULDER SURGERY     left    tongue sx. titanium screw placed to hold tongue down     TOTAL THYROIDECTOMY     Family History: History reviewed. No pertinent family history.  Family Psychiatric  History: See H&P  Social History:  Social History   Substance and Sexual Activity  Alcohol Use No     Social History   Substance and Sexual Activity  Drug Use No    Social History   Socioeconomic History   Marital status: Single    Spouse name: Not on file   Number of children: Not on file   Years of education: Not on file   Highest education level: Not on file  Occupational History   Not on file  Tobacco Use   Smoking status: Never Smoker   Smokeless tobacco: Never Used  Vaping Use   Vaping Use: Never used  Substance and Sexual Activity   Alcohol use: No   Drug use: No   Sexual activity: Not Currently   Other Topics Concern   Not on file  Social History Narrative   Not on file   Social Determinants of Health   Financial Resource Strain:    Difficulty of Paying Living Expenses:   Food Insecurity:    Worried About Charity fundraiser in the Last Year:    Arboriculturist in the Last Year:   Transportation Needs:    Film/video editor (Medical):    Lack of Transportation (Non-Medical):   Physical Activity:    Days of Exercise per Week:    Minutes of Exercise per Session:   Stress:    Feeling of Stress :   Social Connections:    Frequency of Communication with Friends and Family:    Frequency of Social Gatherings with Friends and Family:    Attends Religious Services:    Active Member of Clubs or Organizations:    Attends Archivist Meetings:    Marital Status:    Hospital Course: (Per Md's admission evaluation): Patient is a 54 year old female with an unspecified past psychiatric history who originally presented to the [facility] emergency department after her boyfriend dropped her off.  The patient stated that she was not sleeping well, she was depressed, having anxiety and panic attacks.  She stated she was upset over the fact that she was dying.  She has chronic cirrhosis from multiple Tylenol overdoses  in the past.  She also has a history of alcoholic liver disease.  The patient stated that she did not drink the past, but overdosed on Tylenol multiple times between teenage years to when she was in her 51s or 54s.  She also has a history of cirrhosis with portal hypertension, small volume abdominal ascites and previous admissions for hepatic encephalopathy.  She also has a history of diabetes.  She stated that she had last seen a psychiatrist a couple years ago.  She was seen in either [facility] or [facility] but did not like them.  She stated she moved from [place] to [place] approximately 3 years ago.  She stated that she had been  admitted to the psychiatric hospital many times.  As best as I can see her last psychiatric admission was at [facility] in December 2020 and January 2021.  At that time she had presented to the emergency room with suicidal ideation.  She was depressed because she was told she was dying secondary to liver disease.  She felt like overdosing on medications.  In the emergency department she was found to be in DKA and was admitted to the medical service.  She has had multiple emergency room visits secondary to abdominal pain and other issues.  It does appear that she was discharged to a skilled nursing facility in January 2021.  Her discharge psychiatric medications at that time included Abilify 2 mg p.o. daily and mirtazapine 7.5 mg p.o. nightly.  She is very vague, and has multiple complaints of physical issues.  She was admitted to the psychiatric hospital for evaluation and stabilization.  This is one of several psychiatric discharge summaries from the [facility] for this 54 year old Caucasian female. She is with hx of chronic mental illness, substance use disorders & multiple psychiatric admissions here & from the other psychiatric hospitals within the surrounding areas. She has been tried on multiple psychotropic medications for her symptoms & it appeared [name] has not been adherent to her recommended treatment regimen. She was brought to the hospital this time around for evaluation & treatment for suicidal ideations due to worsening symptoms of depression & declining health conditions. Patient reported that she has lost a good amount of weight in the last 18 months. She presented emaciated with generalized weakness.   After evaluation of her presenting symptoms, [name] was recommended for mood stabilization treatments. The medication regimen for her presenting symptoms were discussed & with her consent initiated. She received, stabilized & was discharged on the medications as listed below on  her discharge medication lists. She was also enrolled & participated in the group counseling sessions being offered & held on this unit. She learned coping skills. She was kept on 1:1 supervision through out her hospital stay. She was provided with a walker to aid her her gait/balance to maintain safety. She presented on this admission, other chronic medical conditions that required treatment & monitoring. She was resumed, treated & discharged on all her pertinent home medications for those pre-existing health issues. She tolerated her treatment regimen without any adverse effects or reactions reported.  During the course of her hospitalization, the 15-minute checks/1:1 supervision were adequate to ensure [name]'s safety.  Patient did not display any dangerous, violent or suicidal behavior on the unit. She interacted with patients & staff appropriately. She participated appropriately in the group sessions/therapies. Her medications were addressed & adjusted to meet her needs. She was recommended for outpatient follow-up care & medication  management upon discharge to assure her continuity of care.  At the time of discharge, patient is not reporting any acute suicidal/homicidal ideations. She feels more confident about her self-care & in managing her symptoms. She currently denies any new issues or concerns. Education and supportive counseling provided throughout her hospital stay & upon discharge.  Today upon her discharge evaluation with the attending psychiatrist, [name] shares she is doing well. She denies any other specific concerns. Her sleep improved remarkably. Her appetite also improved. She denies other physical complaints. She denies AH/VH. She feels that her medications have been helpful & is in agreement to continue her current treatment regimen as recommended. She was able to engage in safety planning including plan to return to [facility] or contact emergency services if she feels unable to maintain her own  safety or the safety of others. Pt had no further questions, comments, or concerns. She left [facility] with all personal belongings in no apparent distress. Transportation per patient's significant other- Sam. She was provided with the rest of her antibiotic therapy medications to complete at home.  Physical Findings: AIMS: Facial and Oral Movements Muscles of Facial Expression: None, normal Lips and Perioral Area: None, normal Jaw: None, normal Tongue: None, normal,Extremity Movements Upper (arms, wrists, hands, fingers): None, normal Lower (legs, knees, ankles, toes): None, normal, Trunk Movements Neck, shoulders, hips: None, normal, Overall Severity Severity of abnormal movements (highest score from questions above): None, normal Incapacitation due to abnormal movements: None, normal Patient's awareness of abnormal movements (rate only patient's report): No Awareness, Dental Status Current problems with teeth and/or dentures?: No Does patient usually wear dentures?: No  CIWA:    COWS:     Musculoskeletal: Strength & Muscle Tone: within normal limits Gait & Station: normal Patient leans: N/A  Psychiatric Specialty Exam: Physical Exam Vitals and nursing note reviewed.  HENT:     Head: Normocephalic.     Nose: Nose normal.     Mouth/Throat:     Pharynx: Oropharynx is clear.  Eyes:     Pupils: Pupils are equal, round, and reactive to light.  Cardiovascular:     Rate and Rhythm: Normal rate.     Pulses: Normal pulses.  Pulmonary:     Effort: Pulmonary effort is normal.  Genitourinary:    Comments: Deferred Musculoskeletal:        General: Normal range of motion.     Cervical back: Normal range of motion.  Skin:    General: Skin is warm and dry.  Neurological:     Mental Status: She is alert and oriented to person, place, and time.     Review of Systems  Constitutional: Negative for activity change, chills, diaphoresis and fever.  HENT: Negative for congestion, rhinorrhea,  sneezing and sore throat.   Eyes: Negative for discharge.  Respiratory: Negative for cough, chest tightness, shortness of breath and wheezing.   Cardiovascular: Negative for chest pain and palpitations.  Gastrointestinal: Negative for diarrhea, nausea and vomiting.  Endocrine: Negative for cold intolerance.  Genitourinary: Negative for difficulty urinating.  Musculoskeletal: Negative for arthralgias, myalgias and neck stiffness.  Allergic/Immunologic: Negative for environmental allergies and food allergies.       Allergies: Acetaminophen Cephalexin  Ciprofloxacin Hydromorphone Hydromorphone Sulfamethoxazole S Sulfamethoxazole-trimethoprim Sulfamethoxazole-trimethoprim Dilaudid (Hydromorphone Hydromorphone Hcl :   Neurological: Negative for dizziness, tremors, seizures, syncope, light-headedness and headaches.  Psychiatric/Behavioral: Positive for dysphoric mood (stabilized with medication prior to discharge) and sleep disturbance (Stabilized with medication prior to discharge). Negative for agitation, behavioral problems,  confusion, decreased concentration, hallucinations, self-injury and suicidal ideas. The patient is not nervous/anxious (Stable upon discharge) and is not hyperactive.     Blood pressure (!) 145/72, pulse (!) 106, temperature 99.6 F (37.6 C), temperature source Oral, resp. rate 18, height 5' 3"  (1.6 m), weight 47.2 kg, SpO2 97 %.Body mass index is 18.42 kg/m.  See Md's discharge SRA  Sleep:  Number of Hours: 5.5   Have you used any form of tobacco in the last 30 days? (Cigarettes, Smokeless Tobacco, Cigars, and/or Pipes): No  Has this patient used any form of tobacco in the last 30 days? (Cigarettes, Smokeless Tobacco, Cigars, and/or Pipes): N/A  Blood Alcohol level:  Lab Results  Component Value Date   ETH <10 16/96/7893   Metabolic Disorder Labs:  Lab Results  Component Value Date   HGBA1C 7.4 (H) 10/04/2019   MPG 165.68 10/04/2019   MPG 211.6 01/26/2019    No results found for: PROLACTIN No results found for: CHOL, TRIG, HDL, CHOLHDL, VLDL, LDLCALC  See Psychiatric Specialty Exam and Suicide Risk Assessment completed by Attending Physician prior to discharge.  Discharge destination:  Home  Is patient on multiple antipsychotic therapies at discharge:  No   Has Patient had three or more failed trials of antipsychotic monotherapy by history:  No  Recommended Plan for Multiple Antipsychotic Therapies: NA  Allergies as of 10/10/2019      Reactions   Acetaminophen Hives, Other (See Comments)   Tolerates Fioricet   Cephalexin Itching, Swelling, Other (See Comments)   Tongue swells and makes throat itchy- can tolerate Zosyn   Ciprofloxacin Hives   Hydromorphone Hives   Sulfamethoxazole Shortness Of Breath, Swelling, Other (See Comments)   Tongue swells   Sulfamethoxazole-trimethoprim Shortness Of Breath, Swelling, Other (See Comments)   Tongue swells   Trimethoprim Shortness Of Breath, Swelling, Other (See Comments)   Tongue swells   Dilaudid [hydromorphone Hcl] Hives   Hydromorphone Hcl Hives      Medication List    STOP taking these medications   diphenhydrAMINE 25 mg capsule Commonly known as: BENADRYL   furosemide 20 MG tablet Commonly known as: LASIX   MAGNESIUM PO   melatonin 3 MG Tabs tablet   Oxycodone HCl 10 MG Tabs   OXYGEN   potassium chloride 8 MEQ tablet Commonly known as: KLOR-CON   Toujeo Max SoloStar 300 UNIT/ML Solostar Pen Generic drug: insulin glargine (2 Unit Dial) Replaced by: insulin glargine 100 UNIT/ML injection     TAKE these medications     Indication  albuterol 108 (90 Base) MCG/ACT inhaler Commonly known as: VENTOLIN HFA Inhale 2 puffs into the lungs every 4 (four) hours as needed for wheezing or shortness of breath.  Indication: Chronic Obstructive Lung Disease   albuterol (2.5 MG/3ML) 0.083% nebulizer solution Commonly known as: PROVENTIL Inhale 2.5 mg into the lungs every 6  (six) hours as needed for wheezing or shortness of breath.  Indication: Spasm of Lung Air Passages   amoxicillin 250 MG capsule Commonly known as: AMOXIL Take 1 capsule (250 mg total) by mouth every 8 (eight) hours. For infection.  Indication: Infection   ARIPiprazole 5 MG tablet Commonly known as: ABILIFY Take 1 tablet (5 mg total) by mouth daily. For mood control/adjunct to antidepressant Start taking on: October 11, 2019  Indication: Mood control/adjunct to antidepressant   cholecalciferol 25 MCG (1000 UNIT) tablet Commonly known as: VITAMIN D3 Take 1,000 Units by mouth daily.  Indication: Vitamin D Deficiency   Creon 12000-38000 units  Cpep capsule Generic drug: lipase/protease/amylase Take 12,000 Units by mouth See admin instructions. Take 12,000 units by mouth one to two times a day with snacks What changed: Another medication with the same name was removed. Continue taking this medication, and follow the directions you see here.  Indication: Pancreatic Insufficiency   fluconazole 100 MG tablet Commonly known as: DIFLUCAN Take 1 tablet (100 mg total) by mouth daily. For infection Start taking on: October 11, 2019  Indication: Infection caused by Fungus   FLUoxetine 20 MG capsule Commonly known as: PROZAC Take 1 capsule (20 mg total) by mouth daily. For depression Start taking on: October 11, 2019  Indication: Major Depressive Disorder   folic acid 235 MCG tablet Commonly known as: FOLVITE Take 400 mcg by mouth daily.  Indication: Anemia From Inadequate Folic Acid   gabapentin 100 MG capsule Commonly known as: NEURONTIN Take 2 capsules (200 mg total) by mouth 3 (three) times daily. For agitation/neuropathy  Indication: Neuropathic Pain, Agitation   HumaLOG KwikPen 100 UNIT/ML KwikPen Generic drug: insulin lispro Inject 3-10 Units into the skin See admin instructions. Inject 3-10 units into the skin three times a day with meals, per sliding scale  Indication: Type 2  Diabetes   hydrOXYzine 25 MG tablet Commonly known as: ATARAX/VISTARIL Take 1 tablet (25 mg total) by mouth 3 (three) times daily as needed for anxiety. What changed:   how much to take  when to take this  reasons to take this  Indication: Feeling Anxious   insulin aspart 100 UNIT/ML injection Commonly known as: novoLOG Inject 10 Units into the skin 3 (three) times daily with meals. For diabetes management  Indication: Type 2 Diabetes   insulin glargine 100 UNIT/ML injection Commonly known as: LANTUS Inject 0.35 mLs (35 Units total) into the skin daily. For diabetes management Start taking on: October 11, 2019 Replaces: Toujeo Max SoloStar 300 UNIT/ML Solostar Pen  Indication: Type 2 Diabetes   Jardiance 25 MG Tabs tablet Generic drug: empagliflozin Take 25 mg by mouth daily.  Indication: Type 2 Diabetes   lactulose 10 GM/15ML solution Commonly known as: CHRONULAC Take 20 g by mouth See admin instructions. 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  Indication: Underactive Thyroid   megestrol 40 MG tablet Commonly known as: MEGACE Take 1 tablet (40 mg total) by mouth daily. For Cachexia Start taking on: October 11, 2019  Indication: General Weight Loss and Wasting   mirtazapine 15 MG tablet Commonly known as: REMERON Take 1 tablet (15 mg total) by mouth at bedtime. For depression  Indication: Major Depressive Disorder, Insomnia   ondansetron 4 MG disintegrating tablet Commonly known as: ZOFRAN-ODT Take 4 mg by mouth 3 (three) times daily before meals. Dissolve 4 mg sublingually three times a day before meals and snacks  Indication: Nausea and Vomiting   pantoprazole 40 MG tablet Commonly known as: PROTONIX Take 40 mg by mouth daily before breakfast.  Indication: Gastroesophageal Reflux  Disease   tiZANidine 4 MG tablet Commonly known as: ZANAFLEX Take 4 mg by mouth 3 (three) times daily as needed for muscle spasms.  Indication: Muscle Spasticity       Follow-up Information    Services, [facility]. Go on 10/11/2019.   Why: You have an appointment on 10/11/19 at 8:30 am for therapy and  medication services. They will set an appointment with a new provider for therapy services at that time. Contact information: [place] [address] [phone]              Follow-up recommendations: Activity:  As tolerated Diet: As recommended by your primary care doctor. Keep all scheduled follow-up appointments as recommended.    Comments: Prescriptions given at discharge.  Patient agreeable to plan.  Given opportunity to ask questions.  Appears to feel comfortable with discharge denies any current suicidal or homicidal thought. Patient is also instructed prior to discharge to: Take all medications as prescribed by his/her mental healthcare provider. Report any adverse effects and or reactions from the medicines to his/her outpatient provider promptly. Patient has been instructed & cautioned: To not engage in alcohol and or illegal drug use while on prescription medicines. In the event of worsening symptoms, patient is instructed to call the crisis hotline, 911 and or go to the nearest ED for appropriate evaluation and treatment of symptoms. To follow-up with his/her primary care provider for your other medical issues, concerns and or health care needs.  Signed: Lindell Spar, NP, PMHNP, FNP-BC 10/10/2019, 10:10 AM   Patient seen, Suicide Assessment Completed.  Disposition Plan Reviewed

## 2019-10-14 ENCOUNTER — Telehealth (HOSPITAL_COMMUNITY): Payer: Self-pay | Admitting: Psychiatry

## 2019-10-14 NOTE — Telephone Encounter (Signed)
Patients called to advise she is being seen by [facility] in [place] and would like to cancel appt.

## 2019-10-15 ENCOUNTER — Ambulatory Visit (HOSPITAL_COMMUNITY): Payer: Medicare HMO | Admitting: Psychiatry

## 2019-10-29 ENCOUNTER — Emergency Department (HOSPITAL_COMMUNITY)
Admission: EM | Admit: 2019-10-29 | Discharge: 2019-10-29 | Disposition: A | Discharge status: Home / Self Care | Payer: Medicare HMO | Attending: Emergency Medicine | Admitting: Emergency Medicine

## 2019-10-29 ENCOUNTER — Other Ambulatory Visit: Payer: Self-pay

## 2019-10-29 ENCOUNTER — Encounter (HOSPITAL_COMMUNITY): Payer: Self-pay | Admitting: Emergency Medicine

## 2019-10-29 DIAGNOSIS — R410 Disorientation, unspecified: Secondary | ICD-10-CM | POA: Diagnosis not present

## 2019-10-29 DIAGNOSIS — Z5321 Procedure and treatment not carried out due to patient leaving prior to being seen by health care provider: Secondary | ICD-10-CM | POA: Diagnosis not present

## 2019-10-29 MED ORDER — SODIUM CHLORIDE 0.9% FLUSH: 3.0000 mL | Freq: Once | INTRAVENOUS | Status: DC

## 2019-10-29 NOTE — ED Triage Notes (Signed)
Pt states she is feeling more confused and "not herself". Pt reports she feels this way when her ammonia level is elevated. States she has been taking her lactulose as prescribed.

## 2019-11-02 DIAGNOSIS — I38 Endocarditis, valve unspecified: Secondary | ICD-10-CM | POA: Insufficient documentation

## 2019-11-06 ENCOUNTER — Encounter (HOSPITAL_COMMUNITY): Payer: Self-pay | Admitting: Emergency Medicine

## 2019-11-06 ENCOUNTER — Other Ambulatory Visit: Payer: Self-pay

## 2019-11-06 ENCOUNTER — Emergency Department (HOSPITAL_COMMUNITY)
Admission: EM | Admit: 2019-11-06 | Discharge: 2019-11-06 | Disposition: A | Discharge status: Home / Self Care | Payer: Medicare HMO | Attending: Emergency Medicine | Admitting: Emergency Medicine

## 2019-11-06 DIAGNOSIS — R350 Frequency of micturition: Secondary | ICD-10-CM | POA: Insufficient documentation

## 2019-11-06 DIAGNOSIS — R682 Dry mouth, unspecified: Secondary | ICD-10-CM | POA: Insufficient documentation

## 2019-11-06 DIAGNOSIS — Z5321 Procedure and treatment not carried out due to patient leaving prior to being seen by health care provider: Secondary | ICD-10-CM | POA: Insufficient documentation

## 2019-11-06 LAB — BASIC METABOLIC PANEL
Anion gap: 9 (ref 5–15)
BUN: 11 mg/dL (ref 6–20)
CO2: 29 mmol/L (ref 22–32)
Calcium: 13 mg/dL — ABNORMAL HIGH (ref 8.9–10.3)
Chloride: 95 mmol/L — ABNORMAL LOW (ref 98–111)
Creatinine, Ser: 0.59 mg/dL (ref 0.44–1.00)
GFR calc Af Amer: >60 mL/min (ref >60)
GFR calc non Af Amer: >60 mL/min (ref >60)
Glucose, Bld: 386 mg/dL — ABNORMAL HIGH (ref 70–99)
Potassium: 4.7 mmol/L (ref 3.5–5.1)
Sodium: 133 mmol/L — ABNORMAL LOW (ref 135–145)

## 2019-11-06 LAB — CBC
HCT: 38.1 % (ref 36.0–46.0)
Hemoglobin: 12.3 g/dL (ref 12.0–15.0)
MCH: 29.7 pg (ref 26.0–34.0)
MCHC: 32.3 g/dL (ref 30.0–36.0)
MCV: 92 fL (ref 80.0–100.0)
Platelets: 58 10*3/uL — ABNORMAL LOW (ref 150–400)
RBC: 4.14 MIL/uL (ref 3.87–5.11)
RDW: 16.8 % — ABNORMAL HIGH (ref 11.5–15.5)
WBC: 5.1 10*3/uL (ref 4.0–10.5)
nRBC: 0 % (ref 0.0–0.2)

## 2019-11-06 LAB — CBG MONITORING, ED: Glucose-Capillary: 458 mg/dL — ABNORMAL HIGH (ref 70–99)

## 2019-11-06 NOTE — ED Triage Notes (Addendum)
Pt blood sugar has been 700's to 800's for past x days.  Reports dry mouth and increased urination. Took 10 units of novolog prior to arriving to ED.

## 2019-11-19 ENCOUNTER — Telehealth (HOSPITAL_COMMUNITY): Payer: Medicare HMO | Admitting: Psychiatry

## 2019-12-05 ENCOUNTER — Emergency Department (HOSPITAL_COMMUNITY)
Admission: EM | Admit: 2019-12-05 | Discharge: 2019-12-06 | Disposition: A | Discharge status: Home / Self Care | Payer: Medicare HMO | Attending: Emergency Medicine | Admitting: Emergency Medicine

## 2019-12-05 ENCOUNTER — Emergency Department (HOSPITAL_COMMUNITY): Payer: Medicare HMO

## 2019-12-05 ENCOUNTER — Encounter (HOSPITAL_COMMUNITY): Payer: Self-pay

## 2019-12-05 DIAGNOSIS — E1165 Type 2 diabetes mellitus with hyperglycemia: Secondary | ICD-10-CM | POA: Insufficient documentation

## 2019-12-05 DIAGNOSIS — R001 Bradycardia, unspecified: Secondary | ICD-10-CM | POA: Diagnosis present

## 2019-12-05 DIAGNOSIS — Z5321 Procedure and treatment not carried out due to patient leaving prior to being seen by health care provider: Secondary | ICD-10-CM | POA: Insufficient documentation

## 2019-12-05 DIAGNOSIS — IMO0002 Reserved for concepts with insufficient information to code with codable children: Secondary | ICD-10-CM | POA: Insufficient documentation

## 2019-12-05 LAB — CBG MONITORING, ED: Glucose-Capillary: 372 mg/dL — ABNORMAL HIGH (ref 70–99)

## 2019-12-05 LAB — CBC
HCT: 32.6 % — ABNORMAL LOW (ref 36.0–46.0)
Hemoglobin: 10.7 g/dL — ABNORMAL LOW (ref 12.0–15.0)
MCH: 31.8 pg (ref 26.0–34.0)
MCHC: 32.8 g/dL (ref 30.0–36.0)
MCV: 97 fL (ref 80.0–100.0)
Platelets: 39 10*3/uL — ABNORMAL LOW (ref 150–400)
RBC: 3.36 MIL/uL — ABNORMAL LOW (ref 3.87–5.11)
RDW: 17.8 % — ABNORMAL HIGH (ref 11.5–15.5)
WBC: 2.7 10*3/uL — ABNORMAL LOW (ref 4.0–10.5)
nRBC: 0 % (ref 0.0–0.2)

## 2019-12-05 LAB — BASIC METABOLIC PANEL
Anion gap: 9 (ref 5–15)
BUN: 12 mg/dL (ref 6–20)
CO2: 23 mmol/L (ref 22–32)
Calcium: 8.9 mg/dL (ref 8.9–10.3)
Chloride: 101 mmol/L (ref 98–111)
Creatinine, Ser: 0.56 mg/dL (ref 0.44–1.00)
GFR calc Af Amer: >60 mL/min (ref >60)
GFR calc non Af Amer: >60 mL/min (ref >60)
Glucose, Bld: 411 mg/dL — ABNORMAL HIGH (ref 70–99)
Potassium: 4.2 mmol/L (ref 3.5–5.1)
Sodium: 133 mmol/L — ABNORMAL LOW (ref 135–145)

## 2019-12-05 LAB — I-STAT BETA HCG BLOOD, ED (MC, WL, AP ONLY): I-stat hCG, quantitative: <5 m[IU]/mL (ref <5)

## 2019-12-05 LAB — TROPONIN I (HIGH SENSITIVITY): Troponin I (High Sensitivity): 7 ng/L (ref <18)

## 2019-12-05 NOTE — ED Triage Notes (Signed)
Pt arrives to ED w/ c/o bradycardia and shortness of breath. Resp e/u, pt speaking in complete sentences. HR 60 in triage. Pt denies chest pain.

## 2019-12-05 NOTE — ED Notes (Signed)
Pt states she cannot wait here she is going home. She will return if symptoms persist or worsen

## 2020-02-18 DIAGNOSIS — R140 Abdominal distension (gaseous): Secondary | ICD-10-CM | POA: Insufficient documentation

## 2020-02-18 DIAGNOSIS — R7989 Other specified abnormal findings of blood chemistry: Secondary | ICD-10-CM | POA: Insufficient documentation

## 2020-02-23 DIAGNOSIS — R011 Cardiac murmur, unspecified: Secondary | ICD-10-CM | POA: Insufficient documentation

## 2020-02-23 DIAGNOSIS — J189 Pneumonia, unspecified organism: Secondary | ICD-10-CM | POA: Insufficient documentation

## 2020-02-23 DIAGNOSIS — R4182 Altered mental status, unspecified: Secondary | ICD-10-CM | POA: Diagnosis present

## 2020-03-30 ENCOUNTER — Encounter (HOSPITAL_COMMUNITY): Payer: Self-pay | Admitting: Emergency Medicine

## 2020-03-30 ENCOUNTER — Inpatient Hospital Stay (HOSPITAL_COMMUNITY): Payer: Medicare HMO

## 2020-03-30 ENCOUNTER — Inpatient Hospital Stay (HOSPITAL_COMMUNITY)
Admission: EM | Admit: 2020-03-30 | Discharge: 2020-04-02 | DRG: 442 | Disposition: A | Discharge status: Home Health | Payer: Medicare HMO | Attending: Family Medicine | Admitting: Family Medicine

## 2020-03-30 ENCOUNTER — Other Ambulatory Visit: Payer: Self-pay

## 2020-03-30 DIAGNOSIS — Z20822 Contact with and (suspected) exposure to covid-19: Secondary | ICD-10-CM | POA: Diagnosis present

## 2020-03-30 DIAGNOSIS — D61818 Other pancytopenia: Secondary | ICD-10-CM | POA: Diagnosis present

## 2020-03-30 DIAGNOSIS — Z515 Encounter for palliative care: Secondary | ICD-10-CM

## 2020-03-30 DIAGNOSIS — D649 Anemia, unspecified: Secondary | ICD-10-CM | POA: Diagnosis not present

## 2020-03-30 DIAGNOSIS — R17 Unspecified jaundice: Secondary | ICD-10-CM | POA: Diagnosis present

## 2020-03-30 DIAGNOSIS — Z6824 Body mass index (BMI) 24.0-24.9, adult: Secondary | ICD-10-CM

## 2020-03-30 DIAGNOSIS — Z7189 Other specified counseling: Secondary | ICD-10-CM | POA: Diagnosis not present

## 2020-03-30 DIAGNOSIS — R1314 Dysphagia, pharyngoesophageal phase: Secondary | ICD-10-CM | POA: Diagnosis present

## 2020-03-30 DIAGNOSIS — K7031 Alcoholic cirrhosis of liver with ascites: Secondary | ICD-10-CM | POA: Diagnosis present

## 2020-03-30 DIAGNOSIS — G4730 Sleep apnea, unspecified: Secondary | ICD-10-CM | POA: Diagnosis present

## 2020-03-30 DIAGNOSIS — E8809 Other disorders of plasma-protein metabolism, not elsewhere classified: Secondary | ICD-10-CM | POA: Diagnosis present

## 2020-03-30 DIAGNOSIS — F603 Borderline personality disorder: Secondary | ICD-10-CM | POA: Diagnosis present

## 2020-03-30 DIAGNOSIS — K7460 Unspecified cirrhosis of liver: Secondary | ICD-10-CM | POA: Diagnosis present

## 2020-03-30 DIAGNOSIS — R601 Generalized edema: Secondary | ICD-10-CM | POA: Diagnosis present

## 2020-03-30 DIAGNOSIS — F329 Major depressive disorder, single episode, unspecified: Secondary | ICD-10-CM | POA: Diagnosis present

## 2020-03-30 DIAGNOSIS — D689 Coagulation defect, unspecified: Secondary | ICD-10-CM | POA: Diagnosis present

## 2020-03-30 DIAGNOSIS — G8929 Other chronic pain: Secondary | ICD-10-CM | POA: Diagnosis present

## 2020-03-30 DIAGNOSIS — E1142 Type 2 diabetes mellitus with diabetic polyneuropathy: Secondary | ICD-10-CM | POA: Diagnosis present

## 2020-03-30 DIAGNOSIS — K219 Gastro-esophageal reflux disease without esophagitis: Secondary | ICD-10-CM | POA: Diagnosis present

## 2020-03-30 DIAGNOSIS — Z66 Do not resuscitate: Secondary | ICD-10-CM | POA: Diagnosis present

## 2020-03-30 DIAGNOSIS — R109 Unspecified abdominal pain: Secondary | ICD-10-CM | POA: Diagnosis not present

## 2020-03-30 DIAGNOSIS — E1165 Type 2 diabetes mellitus with hyperglycemia: Secondary | ICD-10-CM | POA: Diagnosis present

## 2020-03-30 DIAGNOSIS — R188 Other ascites: Secondary | ICD-10-CM | POA: Diagnosis not present

## 2020-03-30 DIAGNOSIS — E1129 Type 2 diabetes mellitus with other diabetic kidney complication: Secondary | ICD-10-CM | POA: Diagnosis present

## 2020-03-30 DIAGNOSIS — E042 Nontoxic multinodular goiter: Secondary | ICD-10-CM | POA: Diagnosis present

## 2020-03-30 DIAGNOSIS — K7210 Chronic hepatic failure without coma: Secondary | ICD-10-CM | POA: Diagnosis present

## 2020-03-30 DIAGNOSIS — R600 Localized edema: Secondary | ICD-10-CM | POA: Diagnosis present

## 2020-03-30 DIAGNOSIS — Z885 Allergy status to narcotic agent status: Secondary | ICD-10-CM

## 2020-03-30 DIAGNOSIS — F259 Schizoaffective disorder, unspecified: Secondary | ICD-10-CM | POA: Diagnosis present

## 2020-03-30 DIAGNOSIS — Z9641 Presence of insulin pump (external) (internal): Secondary | ICD-10-CM | POA: Diagnosis present

## 2020-03-30 DIAGNOSIS — E46 Unspecified protein-calorie malnutrition: Secondary | ICD-10-CM | POA: Diagnosis present

## 2020-03-30 DIAGNOSIS — Z79890 Hormone replacement therapy (postmenopausal): Secondary | ICD-10-CM

## 2020-03-30 DIAGNOSIS — R809 Proteinuria, unspecified: Secondary | ICD-10-CM | POA: Diagnosis present

## 2020-03-30 DIAGNOSIS — R197 Diarrhea, unspecified: Secondary | ICD-10-CM | POA: Diagnosis present

## 2020-03-30 DIAGNOSIS — I10 Essential (primary) hypertension: Secondary | ICD-10-CM | POA: Diagnosis present

## 2020-03-30 DIAGNOSIS — M1990 Unspecified osteoarthritis, unspecified site: Secondary | ICD-10-CM | POA: Diagnosis present

## 2020-03-30 DIAGNOSIS — E7800 Pure hypercholesterolemia, unspecified: Secondary | ICD-10-CM | POA: Diagnosis present

## 2020-03-30 DIAGNOSIS — Z881 Allergy status to other antibiotic agents status: Secondary | ICD-10-CM

## 2020-03-30 DIAGNOSIS — Z882 Allergy status to sulfonamides status: Secondary | ICD-10-CM

## 2020-03-30 DIAGNOSIS — E890 Postprocedural hypothyroidism: Secondary | ICD-10-CM | POA: Diagnosis present

## 2020-03-30 DIAGNOSIS — K7200 Acute and subacute hepatic failure without coma: Secondary | ICD-10-CM | POA: Diagnosis present

## 2020-03-30 DIAGNOSIS — Z79899 Other long term (current) drug therapy: Secondary | ICD-10-CM

## 2020-03-30 DIAGNOSIS — R635 Abnormal weight gain: Secondary | ICD-10-CM | POA: Diagnosis not present

## 2020-03-30 DIAGNOSIS — Z9049 Acquired absence of other specified parts of digestive tract: Secondary | ICD-10-CM

## 2020-03-30 DIAGNOSIS — J449 Chronic obstructive pulmonary disease, unspecified: Secondary | ICD-10-CM | POA: Diagnosis present

## 2020-03-30 DIAGNOSIS — Z794 Long term (current) use of insulin: Secondary | ICD-10-CM

## 2020-03-30 DIAGNOSIS — Z7984 Long term (current) use of oral hypoglycemic drugs: Secondary | ICD-10-CM

## 2020-03-30 DIAGNOSIS — D696 Thrombocytopenia, unspecified: Secondary | ICD-10-CM

## 2020-03-30 DIAGNOSIS — Z8709 Personal history of other diseases of the respiratory system: Secondary | ICD-10-CM

## 2020-03-30 DIAGNOSIS — R1310 Dysphagia, unspecified: Secondary | ICD-10-CM | POA: Diagnosis present

## 2020-03-30 DIAGNOSIS — D539 Nutritional anemia, unspecified: Secondary | ICD-10-CM

## 2020-03-30 DIAGNOSIS — Z888 Allergy status to other drugs, medicaments and biological substances status: Secondary | ICD-10-CM

## 2020-03-30 LAB — CBG MONITORING, ED
Glucose-Capillary: 157 mg/dL — ABNORMAL HIGH (ref 70–99)
Glucose-Capillary: 95 mg/dL (ref 70–99)

## 2020-03-30 LAB — COMPREHENSIVE METABOLIC PANEL
ALT: 21 U/L (ref 0–44)
AST: 50 U/L — ABNORMAL HIGH (ref 15–41)
Albumin: 1.7 g/dL — ABNORMAL LOW (ref 3.5–5.0)
Alkaline Phosphatase: 98 U/L (ref 38–126)
Anion gap: 7 (ref 5–15)
BUN: 8 mg/dL (ref 6–20)
CO2: 27 mmol/L (ref 22–32)
Calcium: 7.9 mg/dL — ABNORMAL LOW (ref 8.9–10.3)
Chloride: 106 mmol/L (ref 98–111)
Creatinine, Ser: 0.52 mg/dL (ref 0.44–1.00)
GFR, Estimated: >60 mL/min (ref >60)
Glucose, Bld: 116 mg/dL — ABNORMAL HIGH (ref 70–99)
Potassium: 3.4 mmol/L — ABNORMAL LOW (ref 3.5–5.1)
Sodium: 140 mmol/L (ref 135–145)
Total Bilirubin: 3.4 mg/dL — ABNORMAL HIGH (ref 0.3–1.2)
Total Protein: 5 g/dL — ABNORMAL LOW (ref 6.5–8.1)

## 2020-03-30 LAB — CBC WITH DIFFERENTIAL/PLATELET
Abs Immature Granulocytes: 0.03 10*3/uL (ref 0.00–0.07)
Basophils Absolute: 0 10*3/uL (ref 0.0–0.1)
Basophils Relative: 1 %
Eosinophils Absolute: 0.2 10*3/uL (ref 0.0–0.5)
Eosinophils Relative: 5 %
HCT: 25.1 % — ABNORMAL LOW (ref 36.0–46.0)
Hemoglobin: 7.9 g/dL — ABNORMAL LOW (ref 12.0–15.0)
Immature Granulocytes: 1 %
Lymphocytes Relative: 24 %
Lymphs Abs: 1 10*3/uL (ref 0.7–4.0)
MCH: 34.8 pg — ABNORMAL HIGH (ref 26.0–34.0)
MCHC: 31.5 g/dL (ref 30.0–36.0)
MCV: 110.6 fL — ABNORMAL HIGH (ref 80.0–100.0)
Monocytes Absolute: 0.5 10*3/uL (ref 0.1–1.0)
Monocytes Relative: 11 %
Neutro Abs: 2.5 10*3/uL (ref 1.7–7.7)
Neutrophils Relative %: 58 %
Platelets: 59 10*3/uL — ABNORMAL LOW (ref 150–400)
RBC: 2.27 MIL/uL — ABNORMAL LOW (ref 3.87–5.11)
RDW: 17.2 % — ABNORMAL HIGH (ref 11.5–15.5)
WBC: 4.2 10*3/uL (ref 4.0–10.5)
nRBC: 0 % (ref 0.0–0.2)

## 2020-03-30 LAB — SARS CORONAVIRUS 2 (TAT 6-24 HRS): SARS Coronavirus 2: NEGATIVE

## 2020-03-30 LAB — GLUCOSE, CAPILLARY
Glucose-Capillary: 110 mg/dL — ABNORMAL HIGH (ref 70–99)
Glucose-Capillary: 88 mg/dL (ref 70–99)
Glucose-Capillary: 94 mg/dL (ref 70–99)

## 2020-03-30 LAB — APTT: aPTT: 45 seconds — ABNORMAL HIGH (ref 24–36)

## 2020-03-30 LAB — HEMOGLOBIN A1C
Hgb A1c MFr Bld: 4.7 % — ABNORMAL LOW (ref 4.8–5.6)
Mean Plasma Glucose: 88.19 mg/dL

## 2020-03-30 LAB — AMMONIA: Ammonia: 59 umol/L — ABNORMAL HIGH (ref 9–35)

## 2020-03-30 LAB — PROTIME-INR
INR: 2.1 — ABNORMAL HIGH (ref 0.8–1.2)
Prothrombin Time: 22.6 seconds — ABNORMAL HIGH (ref 11.4–15.2)

## 2020-03-30 LAB — HIV ANTIBODY (ROUTINE TESTING W REFLEX): HIV Screen 4th Generation wRfx: NONREACTIVE

## 2020-03-30 LAB — LIPASE, BLOOD: Lipase: 23 U/L (ref 11–51)

## 2020-03-30 MED ORDER — LACTULOSE 10 GM/15ML PO SOLN
20.0000 g | Freq: Two times a day (BID) | ORAL | Status: DC
  Administered 2020-03-30 – 2020-03-31 (×4): 20 g via ORAL
  Filled 2020-03-30 (×5): qty 30

## 2020-03-30 MED ORDER — ONDANSETRON HCL 4 MG PO TABS
4.0000 mg | ORAL_TABLET | Freq: Four times a day (QID) | ORAL | Status: DC | PRN
  Administered 2020-03-31: 4 mg via ORAL
  Filled 2020-03-30: qty 1

## 2020-03-30 MED ORDER — PIPERACILLIN-TAZOBACTAM 3.375 G IVPB
3.3750 g | Freq: Three times a day (TID) | INTRAVENOUS | Status: DC
  Administered 2020-03-30 – 2020-04-02 (×8): 3.375 g via INTRAVENOUS
  Filled 2020-03-30 (×8): qty 50

## 2020-03-30 MED ORDER — FUROSEMIDE 40 MG PO TABS
20.0000 mg | ORAL_TABLET | Freq: Two times a day (BID) | ORAL | Status: DC
  Administered 2020-03-30: 20 mg via ORAL
  Filled 2020-03-30: qty 1

## 2020-03-30 MED ORDER — ALBUTEROL SULFATE (2.5 MG/3ML) 0.083% IN NEBU: 2.5000 mg | INHALATION_SOLUTION | Freq: Four times a day (QID) | RESPIRATORY_TRACT | Status: DC | PRN

## 2020-03-30 MED ORDER — INSULIN PUMP
Freq: Three times a day (TID) | SUBCUTANEOUS | Status: DC
  Filled 2020-03-30: qty 1

## 2020-03-30 MED ORDER — INSULIN GLARGINE 100 UNIT/ML ~~LOC~~ SOLN
10.0000 [IU] | Freq: Every day | SUBCUTANEOUS | Status: DC
  Administered 2020-03-30: 10 [IU] via SUBCUTANEOUS
  Filled 2020-03-30 (×3): qty 0.1

## 2020-03-30 MED ORDER — ALBUTEROL SULFATE HFA 108 (90 BASE) MCG/ACT IN AERS: 2.0000 | INHALATION_SPRAY | RESPIRATORY_TRACT | Status: DC | PRN

## 2020-03-30 MED ORDER — INSULIN ASPART 100 UNIT/ML ~~LOC~~ SOLN
0.0000 [IU] | Freq: Three times a day (TID) | SUBCUTANEOUS | Status: DC
  Administered 2020-03-31 (×3): 1 [IU] via SUBCUTANEOUS
  Administered 2020-04-01: 3 [IU] via SUBCUTANEOUS
  Administered 2020-04-01 – 2020-04-02 (×2): 1 [IU] via SUBCUTANEOUS
  Administered 2020-04-02: 2 [IU] via SUBCUTANEOUS

## 2020-03-30 MED ORDER — POTASSIUM CHLORIDE 20 MEQ PO PACK
20.0000 meq | PACK | Freq: Every day | ORAL | Status: DC
  Administered 2020-03-30 – 2020-04-01 (×3): 20 meq via ORAL
  Filled 2020-03-30 (×4): qty 1

## 2020-03-30 MED ORDER — OXYCODONE HCL 5 MG PO TABS
5.0000 mg | ORAL_TABLET | Freq: Four times a day (QID) | ORAL | Status: DC | PRN
  Administered 2020-03-30 – 2020-04-02 (×7): 5 mg via ORAL
  Filled 2020-03-30 (×9): qty 1

## 2020-03-30 MED ORDER — ONDANSETRON HCL 4 MG/2ML IJ SOLN
4.0000 mg | Freq: Four times a day (QID) | INTRAMUSCULAR | Status: DC | PRN
  Administered 2020-04-02: 4 mg via INTRAVENOUS
  Filled 2020-03-30: qty 2

## 2020-03-30 MED ORDER — SPIRONOLACTONE 12.5 MG HALF TABLET
12.5000 mg | ORAL_TABLET | Freq: Every day | ORAL | Status: DC
Start: 2020-03-30 — End: 2020-04-02
  Administered 2020-03-31 – 2020-04-02 (×3): 12.5 mg via ORAL
  Filled 2020-03-30 (×5): qty 1

## 2020-03-30 MED ORDER — FUROSEMIDE 10 MG/ML IJ SOLN
20.0000 mg | Freq: Two times a day (BID) | INTRAMUSCULAR | Status: DC
  Administered 2020-03-30 – 2020-04-02 (×6): 20 mg via INTRAVENOUS
  Filled 2020-03-30 (×7): qty 2

## 2020-03-30 MED ORDER — INSULIN ASPART 100 UNIT/ML ~~LOC~~ SOLN
0.0000 [IU] | Freq: Every day | SUBCUTANEOUS | Status: DC
  Administered 2020-03-31: 3 [IU] via SUBCUTANEOUS

## 2020-03-30 MED ORDER — PANCRELIPASE (LIP-PROT-AMYL) 12000-38000 UNITS PO CPEP
12000.0000 [IU] | ORAL_CAPSULE | Freq: Three times a day (TID) | ORAL | Status: DC
  Administered 2020-03-30 – 2020-04-02 (×9): 12000 [IU] via ORAL
  Filled 2020-03-30 (×15): qty 1

## 2020-03-30 MED ORDER — LEVOTHYROXINE SODIUM 50 MCG PO TABS
50.0000 ug | ORAL_TABLET | Freq: Every day | ORAL | Status: DC
  Administered 2020-03-31 – 2020-04-02 (×3): 50 ug via ORAL
  Filled 2020-03-30 (×4): qty 1

## 2020-03-30 MED ORDER — INSULIN ASPART 100 UNIT/ML ~~LOC~~ SOLN
3.0000 [IU] | Freq: Three times a day (TID) | SUBCUTANEOUS | Status: DC
  Administered 2020-03-31: 3 [IU] via SUBCUTANEOUS

## 2020-03-30 NOTE — H&P (Signed)
History and Physical  [place] DGL:[id number] DOB: [birth year] DOA: 03/30/2020  PCP: [facility] [place]  Patient coming from: Home   I have personally briefly reviewed patient's old medical records in [place]  Chief Complaint: fluid overloaded  HPI: [name] is a 55 y.o. 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  She reports increasing abdominal distention and discomfort.  She reports that unfortunately she has had increasing edema since recently starting an insulin pump on March 24, 2020.  She reports that she is taking her lactulose and Lasix medications and not missing doses.  Her last hospitalization in December 5188 was complicated by severe hepatic encephalopathy requiring intubation for short period of time and fortunately she was extubated within 24 hours.  Her prognosis remains very poor.  She has intermittent episodes of confusion and encephalopathy associated with fluctuating ammonia levels.  She has living with her fianc intermittently who has been assisting her with ADLs.  She reports having dry cough, chest congestion and intermittent low-grade fever.  She reports increasing abdominal distention and weight gain of about 30 pounds since Christmas.  Patient notably has poor health literacy and difficulty managing her end-stage liver disease with this level of complexity and difficult time managing medications and following diet and instructions.  Patient reports that she has been able to manage her insulin pump on her own and reports no significant difficulties or problems with severe hypoglycemia at this time.  I am not sure she is the  right candidate for an insulin pump given her fluctuating confusion and encephalopathy related to her ammonia levels.  At this time would not hold the insulin pump while in the hospital and she can restarted at discharge follow-up with her prescribing doctor.   Review of Systems: Review of Systems  Constitutional: Positive for fever. Negative for chills, diaphoresis, malaise/fatigue and weight loss.  HENT: Negative.   Eyes: Negative.   Respiratory: Positive for cough. Negative for hemoptysis, sputum production, shortness of breath and wheezing.   Cardiovascular: Positive for leg swelling. Negative for chest pain, palpitations, orthopnea and PND.  Gastrointestinal: Positive for abdominal pain, heartburn and nausea. Negative for blood in stool, constipation, diarrhea, melena and vomiting.  Genitourinary: Negative.   Musculoskeletal: Negative.   Skin: Positive for itching and rash.  Neurological: Positive for dizziness, weakness and headaches. Negative for seizures and loss of consciousness.  Endo/Heme/Allergies: Negative.   Psychiatric/Behavioral: Negative.   All other systems reviewed and are negative.   Past Medical History:  Diagnosis Date  . Degenerative joint disease   . Diabetes mellitus without complication ([place])   . Gastroesophageal reflux   . Pancreatitis   . Panic attack   . Sleep apnea   . Thrombocytopenia ([place])     Past Surgical History:  Procedure Laterality Date  . CHOLECYSTECTOMY    . SHOULDER SURGERY     left   . tongue sx. titanium screw placed to hold tongue down    . TOTAL THYROIDECTOMY       reports that she has never smoked. She has never used smokeless tobacco. She reports that she does not drink alcohol and does not use drugs.  Allergies  Allergen Reactions  . Acetaminophen  Hives and Other (See Comments)    Tolerates Fioricet  . Cephalexin Itching, Swelling and Other (See Comments)    Tongue swells and makes throat itchy- can tolerate Zosyn  .  Ciprofloxacin Hives  . Hydromorphone Hives  . Sulfamethoxazole-Trimethoprim Shortness Of Breath, Swelling and Other (See Comments)    Tongue swells     History reviewed. No pertinent family history.   Prior to Admission medications   Medication Sig Start Date End Date Taking? Authorizing Provider  albuterol (PROVENTIL) (2.5 MG/3ML) 0.083% nebulizer solution Inhale 2.5 mg into the lungs every 6 (six) hours as needed for wheezing or shortness of breath.  04/19/19  Yes [provider]  albuterol (VENTOLIN HFA) 108 (90 Base) MCG/ACT inhaler Inhale 2 puffs into the lungs every 4 (four) hours as needed for wheezing or shortness of breath.  01/11/19  Yes [provider]  furosemide (LASIX) 20 MG tablet Take 20 mg by mouth in the morning and at bedtime.   Yes [provider]  insulin aspart (NOVOLOG) 100 UNIT/ML injection Inject 10 Units into the skin 3 (three) times daily with meals. For diabetes management Patient taking differently: Inject into the skin. Via Insulin Pump 10/10/19  Yes Nwoko, Herbert Pun I, NP  Insulin Human (INSULIN PUMP) SOLN Inject into the skin. Novolog   Yes [provider]  lactulose (CHRONULAC) 10 GM/15ML solution Take 20 g by mouth See admin instructions. Take 20 grams by mouth two times a day and hold for diarrhea   Yes [provider]  levothyroxine (SYNTHROID, LEVOTHROID) 50 MCG tablet Take 50 mcg by mouth daily before breakfast.  07/25/14  Yes [provider]  lipase/protease/amylase (CREON) 12000-38000 units CPEP capsule Take 12,000 Units by mouth 3 (three) times daily before meals.   Yes [provider]  Oxycodone HCl 10 MG TABS Take 10 mg by mouth every 6 (six) hours as needed for severe pain. 03/06/20  Yes [provider]  Potassium Chloride ER 20 MEQ TBCR Take 20 mEq by mouth daily. 01/13/20  Yes [provider]  ARIPiprazole (ABILIFY) 5 MG tablet Take 1 tablet (5 mg total) by mouth daily. For  mood control/adjunct to antidepressant Patient not taking: No sig reported 10/11/19   Lindell Spar I, NP  FLUoxetine (PROZAC) 20 MG capsule Take 1 capsule (20 mg total) by mouth daily. For depression Patient not taking: No sig reported 10/11/19   Lindell Spar I, NP  gabapentin (NEURONTIN) 100 MG capsule Take 2 capsules (200 mg total) by mouth 3 (three) times daily. For agitation/neuropathy Patient not taking: No sig reported 10/10/19   Lindell Spar I, NP  hydrOXYzine (ATARAX/VISTARIL) 25 MG tablet Take 1 tablet (25 mg total) by mouth 3 (three) times daily as needed for anxiety. Patient not taking: Reported on 03/30/2020 10/10/19   Lindell Spar I, NP  insulin glargine (LANTUS) 100 UNIT/ML injection Inject 0.35 mLs (35 Units total) into the skin daily. For diabetes management Patient not taking: No sig reported 10/11/19   Lindell Spar I, NP  megestrol (MEGACE) 40 MG tablet Take 1 tablet (40 mg total) by mouth daily. For Cachexia Patient not taking: No sig reported 10/11/19   Lindell Spar I, NP  mirtazapine (REMERON) 15 MG tablet Take 1 tablet (15 mg total) by mouth at bedtime. For depression Patient not taking: No sig reported 10/10/19   Lindell Spar I, NP  lisinopril (ZESTRIL) 10 MG tablet Take 10 mg by mouth daily. Patient not taking: Reported on 10/02/2019 03/29/19 10/02/19  [provider]  metFORMIN (GLUCOPHAGE) 1000 MG tablet Take 1,000 mg by mouth 2 (two) times daily with a meal.  Patient not taking: Reported on 10/02/2019  10/02/19  [provider]  metoCLOPramide (REGLAN) 10 MG tablet Take 10 mg by mouth 4 (four) times daily.  Patient not taking: Reported on 10/02/2019  10/02/19  [provider]  sertraline (ZOLOFT) 100 MG tablet Take 150 mg by mouth daily. Patient not taking: Reported on 10/02/2019 09/10/16 10/02/19  [provider]  spironolactone (ALDACTONE) 50 MG tablet Take 50 mg by mouth daily. Patient not taking: Reported on 10/02/2019 03/27/19 10/02/19  [provider]  traZODone (DESYREL) 100 MG tablet Take 1 tablet by mouth daily. Patient not taking: Reported on 10/02/2019 09/15/16 10/02/19  [provider]   Physical Exam: Vitals:   03/30/20 0421 03/30/20 0422  BP:  137/70  Pulse:  99  Resp:  18  Temp:  97.9 F (36.6 C)  TempSrc:  Oral  SpO2:  94%  Weight: 60.8 kg   Height: 5' 3"  (1.6 m)    Constitutional: chronically ill appearing female, NAD, calm, comfortable, appears older than stated age.  Eyes: PERRL, lids and conjunctivae normal ENMT: Mucous membranes are pale and dry. Posterior pharynx clear of any exudate or lesions. Poor dentition.  Neck: normal, supple, no masses, no thyromegaly Respiratory: clear to auscultation bilaterally, no wheezing, no crackles. Normal respiratory effort. No accessory muscle use.  Cardiovascular: Regular rate and rhythm, no murmurs / rubs / gallops. No extremity edema. 2+ pedal pulses. No carotid bruits.  Abdomen: gravid abdomen with tenderness to light palpation, no masses palpated.  Bowel sounds positive.  Musculoskeletal: no clubbing / cyanosis. No joint deformity upper and lower extremities. Good ROM, no contractures. Normal muscle tone.  Skin: jaundice and scleral icterus. No induration Neurologic: CN 2-12 grossly intact. Sensation intact, DTR normal. Strength 5/5 in all 4.  Psychiatric:  Alert and oriented x 3. Normal mood.   Labs on Admission: I have personally reviewed following labs and imaging studies  CBC: Recent Labs  Lab 03/30/20 0650  WBC 4.2  NEUTROABS 2.5  HGB 7.9*  HCT 25.1*  MCV 110.6*  PLT 59*   Basic Metabolic Panel: Recent Labs  Lab 03/30/20 0650  NA 140  K 3.4*  CL 106  CO2 27  GLUCOSE 116*  BUN 8  CREATININE 0.52  CALCIUM 7.9*   GFR: Estimated Creatinine Clearance: 65 mL/min (by C-G formula based on SCr of 0.52 mg/dL). Liver Function Tests: Recent Labs  Lab 03/30/20 0650  AST 50*  ALT 21  ALKPHOS 98  BILITOT 3.4*  PROT 5.0*  ALBUMIN  1.7*   Recent Labs  Lab 03/30/20 0650  LIPASE 23   Recent Labs  Lab 03/30/20 0650  AMMONIA 59*   Coagulation Profile: Recent Labs  Lab 03/30/20 0650  INR 2.1*   Cardiac Enzymes: No results for input(s): CKTOTAL, CKMB, CKMBINDEX, TROPONINI in the last 168 hours. BNP (last 3 results) No results for input(s): PROBNP in the last 8760 hours. HbA1C: Recent Labs    03/30/20 0710  HGBA1C 4.7*   CBG: Recent Labs  Lab 03/30/20 0426  GLUCAP 157*   Lipid Profile: No results for input(s): CHOL, HDL, LDLCALC, TRIG, CHOLHDL, LDLDIRECT in the last 72 hours. Thyroid Function Tests: No results for input(s): TSH, T4TOTAL, FREET4, T3FREE, THYROIDAB in the last 72 hours. Anemia Panel: No results for input(s): VITAMINB12, FOLATE, FERRITIN, TIBC, IRON, RETICCTPCT in the last 72 hours. Urine  analysis:    Component Value Date/Time   COLORURINE YELLOW 10/04/2019 1802   APPEARANCEUR HAZY (A) 10/04/2019 1802   LABSPEC 1.026 10/04/2019 1802   PHURINE 6.0 10/04/2019 1802   GLUCOSEU >=500 (A) 10/04/2019 1802   HGBUR LARGE (A) 10/04/2019 1802   BILIRUBINUR NEGATIVE 10/04/2019 1802   KETONESUR NEGATIVE 10/04/2019 1802   PROTEINUR NEGATIVE 10/04/2019 1802   UROBILINOGEN 0.2 04/13/2007 1545   NITRITE NEGATIVE 10/04/2019 1802   LEUKOCYTESUR TRACE (A) 10/04/2019 1802    Radiological Exams on Admission: [place] ASCITES (ABDOMEN LIMITED)  Result Date: 03/30/2020 CLINICAL DATA:  Ascites, assessment for paracentesis EXAM: LIMITED ABDOMEN ULTRASOUND FOR ASCITES TECHNIQUE: Limited ultrasound survey for ascites was performed in all four abdominal quadrants. COMPARISON:  Ultrasound abdomen 02/21/2020 FINDINGS: Small volume ascites identified in the abdomen. Volume of ascites is insufficient for paracentesis. IMPRESSION: Insufficient ascites for paracentesis. Electronically Signed   By: Lavonia Dana M.D.   On: 03/30/2020 13:25   Assessment/Plan Active Problems:   Sleep apnea   Thrombocytopenia (HCC)    Borderline personality disorder (HCC)   Chronic abdominal pain   Cirrhosis of liver (HCC)   Diabetic peripheral neuropathy associated with type 2 diabetes mellitus ([place])   Pharyngoesophageal dysphagia   Essential hypertension   H/O partial thyroidectomy   History of COPD   Hyperbilirubinemia   Hypercholesterolemia   Hypoalbuminemia   Microalbuminuria due to type 2 diabetes mellitus (HCC)   Multinodular thyroid   Normocytic anemia, not due to blood loss   Pancytopenia (HCC)   Postoperative hypothyroidism   Schizoaffective disorder ([place])   Uncontrolled type 2 diabetes mellitus with hyperglycemia, with long-term current use of insulin (HCC)   Gastro-esophageal reflux disease without esophagitis   Ascites   Abnormal weight gain    1. End stage liver disease - Unfortunately patient is nearing end stages of her liver disease. She presents volume overloaded and she has been restarted on lasix.   2. GERD - protonix ordered for GI protection. 3. Liver cirrhosis- resume home lactulose and diuretics. 4. Brittle type 2 diabetes mellitus-patient is insulin requiring and has recently been started on insulin pump.  Holding insulin pump while in the hospital could resume after discharge.  Conference with the diabetes coordinator and we are going to place her on Lantus and NovoLog while in hospital.  Monitor CBG 5 times per day. 5. Ascites- ultrasound fluid survey was completed with not enough fluid seen for paracentesis.  She is empirically being covered for SBP with ceftriaxone. 6. Schizoaffective disorder-patient no longer taking any of her psychiatric medications for unclear reasons.  She likely decided that when she went on home hospice those medications were discontinued. 7. Goals of care -palliative medicine consultation requested for assistance with goals of care and advance care planning.   DVT prophylaxis: SCDs  Code Status: full   Family Communication: fiance at bedside   Disposition  Plan: home   Consults called: palliative medicine   Admission status: inpatient   Irwin Brakeman MD [facility] How to contact the [facility] Attending or Consulting provider [place] or covering provider during after hours [place], for this patient?  1. Check the care team in [facility] and look for a) attending/consulting [facility] provider listed and b) the [facility] team listed 2. Log into [email or web address] and use [place]'s universal password to access. If you do not have the password, please contact the hospital operator. 3. Locate the [facility] provider you are looking for under [facility] and page to a  number that you can be directly reached. 4. If you still have difficulty reaching the provider, please page the [facility] (Director on Call) for the Hospitalists listed on amion for assistance.   If 7PM-7AM, please contact night-coverage [email or web address] Password [facility]  03/30/2020, 4:13 PM

## 2020-03-30 NOTE — ED Notes (Signed)
Spoke with [place] Stagers will review request. [place]aria Stagers is off duty at this time.

## 2020-03-30 NOTE — Consult Note (Signed)
Consultation Note Date: 03/30/2020   Patient Name: [name]  DOB: 08-28-1965  MRN: [id number]  Age / Sex: 55 y.o., female  PCP: [facility] Referring Physician: Murlean Iba, MD  Reason for Consultation: Establishing goals of care and Psychosocial/spiritual support  HPI/Patient Profile: 55 y.o. female  with past medical history of end stage cirrhosis, scizoaffective disorder, DJD, DM, GERD, pancreatitis, sleep apnea, panic attack,  admitted on 03/30/2020 with ascites.   Clinical Assessment and Goals of Care: [name] is sitting quietly on the stretcher in the ED hallway.  She greets me making and mostly keeping contact.  She appears acutely/chronically ill and quite frail, much older than stated age.  She is alert and oriented x3, able to make her needs known.  There is no family at bedside at this time.  We talked in detail about her acute and chronic health concerns.  She states that she was hospitalized 14 times in 2020, but has been able to stay out of the hospital mostly in 2021.  She has however had 6 ED visits in the last 6 months.  [name] tells me that she is living independently, but her significant other, Sam Hodgin lives with her sporadically.  [name] shares that she had ultrasound, and they are waiting to see if she could have a paracentesis.  She states that she is managing her fluid now with fluid pills, but this can be difficult.    Conference with bedside nursing staff. PMT to continue to follow.   HCPOA  OTHER -Shelma tells me that she would like for her significant other, Sam Hodgin, to be her healthcare surrogate.  She does have a brother, Ledon Snare, but shares that Sam knows more about her health and her wishes.  Spiritual care consult completed for [facility] PO[facility].    SUMMARY OF RECOMMENDATIONS   Continue to treat the treatable but no CPR or intubation Time  for outcomes.   Code Status/Advance Care Planning:  DNR -verified with the patient.  She states that she was intubated against her wishes in December at [facility].  Symptom Management:   Per hospitalist, no additional needs at this time.  Palliative Prophylaxis:   Oral Care and Turn Reposition  Additional Recommendations (Limitations, Scope, Preferences):  Treat the treatable but no extraordinary measures  Psycho-social/Spiritual:   Desire for further Chaplaincy support:yes  Additional Recommendations: Caregiving  Support/Resources and Education on Hospice  Prognosis:   Unable to determine, based on outcomes.  6 months or less would not be surprising based on chronic disease burden, low albumin  Discharge Planning: To be determined, based on outcomes.  Anticipate return home.      Primary Diagnoses: Present on Admission: . Ascites   I have reviewed the medical record, interviewed the patient and family, and examined the patient. The following aspects are pertinent.  Past Medical History:  Diagnosis Date  . Degenerative joint disease   . Diabetes mellitus without complication ([place])   . Gastroesophageal reflux   . Pancreatitis   .  Panic attack   . Sleep apnea   . Thrombocytopenia ([place])    Social History   Socioeconomic History  . Marital status: Single    Spouse name: Not on file  . Number of children: Not on file  . Years of education: Not on file  . Highest education level: Not on file  Occupational History  . Not on file  Tobacco Use  . Smoking status: Never Smoker  . Smokeless tobacco: Never Used  Vaping Use  . Vaping Use: Never used  Substance and Sexual Activity  . Alcohol use: No  . Drug use: No  . Sexual activity: Not Currently  Other Topics Concern  . Not on file  Social History Narrative  . Not on file   Social Determinants of Health   Financial Resource Strain: Not on file  Food Insecurity: Not on file  Transportation Needs:  Not on file  Physical Activity: Not on file  Stress: Not on file  Social Connections: Not on file   History reviewed. No pertinent family history. Scheduled Meds: . furosemide  20 mg Oral BID  . insulin aspart  0-5 Units Subcutaneous QHS  . insulin aspart  0-9 Units Subcutaneous TID WC  . insulin aspart  3 Units Subcutaneous TID WC  . insulin glargine  10 Units Subcutaneous Daily  . lactulose  20 g Oral BID  . [START ON 03/31/2020] levothyroxine  50 mcg Oral Q0600  . lipase/protease/amylase  12,000 Units Oral TID AC  . potassium chloride  20 mEq Oral Daily   Continuous Infusions: PRN Meds:.albuterol, ondansetron **OR** ondansetron (ZOFRAN) IV, oxyCODONE Medications Prior to Admission:  Prior to Admission medications   Medication Sig Start Date End Date Taking? Authorizing Provider  albuterol (PROVENTIL) (2.5 MG/3ML) 0.083% nebulizer solution Inhale 2.5 mg into the lungs every 6 (six) hours as needed for wheezing or shortness of breath.  04/19/19  Yes [provider]  albuterol (VENTOLIN HFA) 108 (90 Base) MCG/ACT inhaler Inhale 2 puffs into the lungs every 4 (four) hours as needed for wheezing or shortness of breath.  01/11/19  Yes [provider]  furosemide (LASIX) 20 MG tablet Take 20 mg by mouth in the morning and at bedtime.   Yes [provider]  insulin aspart (NOVOLOG) 100 UNIT/ML injection Inject 10 Units into the skin 3 (three) times daily with meals. For diabetes management Patient taking differently: Inject into the skin. Via Insulin Pump 10/10/19  Yes Nwoko, Herbert Pun I, NP  Insulin Human (INSULIN PUMP) SOLN Inject into the skin. Novolog   Yes [provider]  lactulose (CHRONULAC) 10 GM/15ML solution Take 20 g by mouth See admin instructions. Take 20 grams by mouth two times a day and hold for diarrhea   Yes [provider]  levothyroxine (SYNTHROID, LEVOTHROID) 50 MCG tablet Take 50 mcg by mouth daily before breakfast.  07/25/14  Yes  [provider]  lipase/protease/amylase (CREON) 12000-38000 units CPEP capsule Take 12,000 Units by mouth 3 (three) times daily before meals.   Yes [provider]  Oxycodone HCl 10 MG TABS Take 10 mg by mouth every 6 (six) hours as needed for severe pain. 03/06/20  Yes [provider]  Potassium Chloride ER 20 MEQ TBCR Take 20 mEq by mouth daily. 01/13/20  Yes [provider]  ARIPiprazole (ABILIFY) 5 MG tablet Take 1 tablet (5 mg total) by mouth daily. For mood control/adjunct to antidepressant Patient not taking: No sig reported 10/11/19   Encarnacion Slates,  NP  FLUoxetine (PROZAC) 20 MG capsule Take 1 capsule (20 mg total) by mouth daily. For depression Patient not taking: No sig reported 10/11/19   Lindell Spar I, NP  gabapentin (NEURONTIN) 100 MG capsule Take 2 capsules (200 mg total) by mouth 3 (three) times daily. For agitation/neuropathy Patient not taking: No sig reported 10/10/19   Lindell Spar I, NP  hydrOXYzine (ATARAX/VISTARIL) 25 MG tablet Take 1 tablet (25 mg total) by mouth 3 (three) times daily as needed for anxiety. Patient not taking: Reported on 03/30/2020 10/10/19   Lindell Spar I, NP  insulin glargine (LANTUS) 100 UNIT/ML injection Inject 0.35 mLs (35 Units total) into the skin daily. For diabetes management Patient not taking: No sig reported 10/11/19   Lindell Spar I, NP  megestrol (MEGACE) 40 MG tablet Take 1 tablet (40 mg total) by mouth daily. For Cachexia Patient not taking: No sig reported 10/11/19   Lindell Spar I, NP  mirtazapine (REMERON) 15 MG tablet Take 1 tablet (15 mg total) by mouth at bedtime. For depression Patient not taking: No sig reported 10/10/19   Lindell Spar I, NP  lisinopril (ZESTRIL) 10 MG tablet Take 10 mg by mouth daily. Patient not taking: Reported on 10/02/2019 03/29/19 10/02/19  [provider]  metFORMIN (GLUCOPHAGE) 1000 MG tablet Take 1,000 mg by mouth 2 (two) times daily with a meal.  Patient not taking:  Reported on 10/02/2019  10/02/19  [provider]  metoCLOPramide (REGLAN) 10 MG tablet Take 10 mg by mouth 4 (four) times daily.  Patient not taking: Reported on 10/02/2019  10/02/19  [provider]  sertraline (ZOLOFT) 100 MG tablet Take 150 mg by mouth daily. Patient not taking: Reported on 10/02/2019 09/10/16 10/02/19  [provider]  spironolactone (ALDACTONE) 50 MG tablet Take 50 mg by mouth daily. Patient not taking: Reported on 10/02/2019 03/27/19 10/02/19  [provider]  traZODone (DESYREL) 100 MG tablet Take 1 tablet by mouth daily. Patient not taking: Reported on 10/02/2019 09/15/16 10/02/19  [provider]   Allergies  Allergen Reactions  . Acetaminophen Hives and Other (See Comments)    Tolerates Fioricet  . Cephalexin Itching, Swelling and Other (See Comments)    Tongue swells and makes throat itchy- can tolerate Zosyn  . Ciprofloxacin Hives  . Hydromorphone Hives  . Sulfamethoxazole-Trimethoprim Shortness Of Breath, Swelling and Other (See Comments)    Tongue swells    Review of Systems  Unable to perform ROS: Other    Physical Exam Vitals and nursing note reviewed.     Vital Signs: BP 137/70   Pulse 99   Temp 97.9 F (36.6 C) (Oral)   Resp 18   Ht 5' 3"  (1.6 m)   Wt 60.8 kg   SpO2 94%   BMI 23.74 kg/m          SpO2: SpO2: 94 % O2 Device:SpO2: 94 % O2 Flow Rate: .   IO: Intake/output summary: No intake or output data in the 24 hours ending 03/30/20 1448  LBM:   Baseline Weight: Weight: 60.8 kg Most recent weight: Weight: 60.8 kg     Palliative Assessment/Data:   Flowsheet Rows   Flowsheet Row Most Recent Value  Intake Tab   Referral Department Hospitalist  Unit at Time of Referral Med/Surg Unit  Palliative Care Primary Diagnosis Other (Comment)  Date Notified 03/30/20  Palliative Care Type New Palliative care  Reason for referral Clarify Goals of Care  Date of Admission 03/30/20  Date first  seen by  Palliative Care 03/30/20  # of days Palliative referral response time 0 Day(s)  # of days IP prior to Palliative referral 0  Clinical Assessment   Palliative Performance Scale Score 40%  Pain Max last 24 hours Not able to report  Pain Min Last 24 hours Not able to report  Dyspnea Max Last 24 Hours Not able to report  Dyspnea Min Last 24 hours Not able to report  Psychosocial & Spiritual Assessment   Palliative Care Outcomes       Time In: 1410 Time Out: 1500 Time Total: 50 minutes  Greater than 50%  of this time was spent counseling and coordinating care related to the above assessment and plan.  Signed by: Drue Novel, NP   Please contact Palliative Medicine Team phone at [phone] for questions and concerns.  For individual provider: See Shea Evans

## 2020-03-30 NOTE — ED Triage Notes (Signed)
Pt with end-stage liver failure and states she has "a lot of fluid in her abdomen".

## 2020-03-30 NOTE — Progress Notes (Addendum)
Inpatient Diabetes Program Recommendations  AACE/ADA: New Consensus Statement on Inpatient Glycemic Control (2015)  Target Ranges:  Prepandial:   less than 140 mg/dL      Peak postprandial:   less than 180 mg/dL (1-2 hours)      Critically ill patients:  140 - 180 mg/dL   Results for [name], [name] (MRN [id number]) as of 03/30/2020 12:26  Ref. Range 03/30/2020 04:26  Glucose-Capillary Latest Ref Range: 70 - 99 mg/dL 481 (H)    To ED with: Anasarca/ Cirrhosis of Liver with Ascites/ Anemia  History: DM, End Stage Cirrhosis  Home DM Meds: Insulin Pump  Current Orders: Insulin Pump     Per Review of Chart, it looks like pt was started on her insulin pump last week--Had initial insulin pump training on 03/24/2020 with [facility] Endo Om[employer] Pod Insulin Pump  I called over to the ENDO office but was unable to reach the pump trainers and get the Insulin Pump settings.  Since pt is no new to insulin pump therapy, recommend we have pt stop her Insulin pump and initiate basal/bolus therapy. Recommend: Lantus 10 units daily (was taking Toujeo 10 units daily prior to starting pump) Do not have pt turn off home insulin pump until Lantus on board Novolog 0-9 units TID AC + HS Novolog 3 units TID with meals for meal coverage   Patient can resume her home insulin pump when she goes home  Communicated with Dr. Laural Benes (Attending MD) via secure chat about the above info as well     --Will follow patient during hospitalization--  Ambrose Finland RN, MSN, CDE Diabetes Coordinator Inpatient Glycemic Control Team Team Pager: [phone] (8a-5p)

## 2020-03-30 NOTE — ED Provider Notes (Signed)
[facility] Provider Note   CSN: [id number] Arrival date & time: 03/30/20  0040   Time seeing 6:09 AM  History Chief Complaint  Patient presents with  . Fluid Retention    [name] is a 55 y.o. female.  HPI   Patient has a history of end-stage cirrhosis and she was [facility].  She states she started retaining fluid since Christmas.  She states she is taking her lactulose and her fluid pills.  She states her abdomen has been hurting since before Christmas.  She states she has been having fevers off and on and thinks maybe she had a low-grade fever the last couple days.  She chronically has nausea and has not had vomiting.  She states she has diarrhea as expected from the lactulose.  She is having a cough, sore throat, and nasal congestion and has to be using her inhaler and nebulizer more recently because of those symptoms.  Her legs have been swelling since before Christmas time.  She states that Christmas time her weight was 100 pounds and now she is 133 pounds.  She states that she has had them attempted do paracentesis in the past however the fluid has always been in a place that they can access it per her information.  [facility]   [facility]   Past Medical History:  Diagnosis Date  . Degenerative joint disease   . Diabetes mellitus without complication (HCC)   . Gastroesophageal reflux   . Pancreatitis   . Panic attack   . Sleep apnea   . Thrombocytopenia [facility](HCC)     Patient Active Problem List   Diagnosis Date Noted  . Severe recurrent major depressive disorder with psychotic features (HCC) 10/05/2019  . Major depressive disorder, single episode, severe with psychotic features (HCC) 10/04/2019  . Abnormal head CT 04/02/2019  . Multiple falls 04/02/2019  . Stroke-like symptoms 04/02/2019  . Panic attack 03/30/2019  . History of COPD 03/20/2019  . Chronic  abdominal pain 02/05/2019  . DKA (diabetic ketoacidoses) 01/26/2019  . Sleep apnea   . Gastroesophageal reflux   . Hepatitis A 01/13/2019  . 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HCC) 01/12/2019  . COVID-19 01/12/2019  . Glycosuria 01/12/2019  . Hyperbilirubinemia 01/12/2019  . Hypoalbuminemia 01/12/2019  . Normocytic anemia, not due to blood loss 01/12/2019  . Uncontrolled type 2 diabetes mellitus with hyperglycemia, with long-term current use of insulin (HCC) 01/12/2019  . Low TSH level 01/12/2019  . Chest pain 12/16/2018  . Left-sided weakness 12/16/2018  . Weight loss 12/16/2018  . Schizoaffective disorder (HCC) 03/10/2018  . Essential hypertension 03/09/2018  . H/O partial thyroidectomy 10/25/2017  . Thyroid nodule 10/25/2017  . Cricopharyngeal hypertrophy 09/26/2017  . Pharyngoesophageal dysphagia 09/26/2017  . Reaction to severe stress 09/11/2017  . Newly diagnosed infection due to multidrug resistant organism 05/14/2017  . Left hemiparesis (HCC) 04/18/2017  . Disorder of shoulder 03/03/2017  . Lactic acidosis 09/27/2016  . Pancytopenia (HCC) 09/27/2016  . Acquired hypothyroidism 09/27/2016  . Pyelonephritis 09/27/2016  . Microalbuminuria due to type 2 diabetes mellitus (HCC) 04/20/2016  . Diabetic peripheral neuropathy associated with type 2 diabetes mellitus (HCC) 12/03/2015  . Hepatic steatosis 12/03/2015  . Diabetes mellitus without complication (HCC) 03/20/2015  . Moderate episode of recurrent major depressive disorder (HCC) 03/20/2015  . Complicated UTI (urinary tract infection) 12/25/2014  . Vaginitis and vulvovaginitis 11/06/2014  . Blurry vision, bilateral  10/17/2014  . Health care maintenance 10/17/2014  . Nausea 10/17/2014  . Multinodular thyroid 07/01/2014  . Cirrhosis of liver (HCC) 03/05/2014  . Postoperative hypothyroidism 03/05/2014  . Type 2 diabetes mellitus (HCC) 03/05/2014  . Thrombocytopenia (HCC) 01/17/2014  . Alcoholic cirrhosis of  liver without ascites (HCC) 01/17/2014  . Back problem 05/28/2013  . Low back pain 04/12/2013  . Borderline personality disorder (HCC) 08/25/2012  . Depression 08/25/2012  . Hypercholesterolemia 08/25/2012  . Osteoarthrosis 08/25/2012  . Gastro-esophageal reflux disease without esophagitis 08/25/2012  . Obstructive sleep apnea 08/25/2012    Past Surgical History:  Procedure Laterality Date  . CHOLECYSTECTOMY    . SHOULDER SURGERY     left   . tongue sx. titanium screw placed to hold tongue down    . TOTAL THYROIDECTOMY       OB History   No obstetric history on file.     History reviewed. No pertinent family history.  Social History   Tobacco Use  . Smoking status: Never Smoker  . Smokeless tobacco: Never Used  Vaping Use  . Vaping Use: Never used  Substance Use Topics  . Alcohol use: No  . Drug use: No  Lives at home alone, states her boyfriend visits regularly.  Home Medications Prior to Admission medications   Medication Sig Start Date End Date Taking? Authorizing Provider  albuterol (PROVENTIL) (2.5 MG/3ML) 0.083% nebulizer solution Inhale 2.5 mg into the lungs every 6 (six) hours as needed for wheezing or shortness of breath.  04/19/19   [provider]  albuterol (VENTOLIN HFA) 108 (90 Base) MCG/ACT inhaler Inhale 2 puffs into the lungs every 4 (four) hours as needed for wheezing or shortness of breath.  01/11/19   [provider]  amoxicillin (AMOXIL) 250 MG capsule Take 1 capsule (250 mg total) by mouth every 8 (eight) hours. For infection. 10/10/19   Armandina Stammer I, NP  ARIPiprazole (ABILIFY) 5 MG tablet Take 1 tablet (5 mg total) by mouth daily. For mood control/adjunct to antidepressant 10/11/19   Armandina Stammer I, NP  cholecalciferol (VITAMIN D3) 25 MCG (1000 UNIT) tablet Take 1,000 Units by mouth daily.    [provider]  empagliflozin (JARDIANCE) 25 MG TABS tablet Take 25 mg by mouth daily.  08/25/18   [provider]   fluconazole (DIFLUCAN) 100 MG tablet Take 1 tablet (100 mg total) by mouth daily. For infection 10/11/19   Armandina Stammer I, NP  FLUoxetine (PROZAC) 20 MG capsule Take 1 capsule (20 mg total) by mouth daily. For depression 10/11/19   Armandina Stammer I, NP  folic acid (FOLVITE) 400 MCG tablet Take 400 mcg by mouth daily. Patient not taking: Reported on 10/03/2019    [provider]  gabapentin (NEURONTIN) 100 MG capsule Take 2 capsules (200 mg total) by mouth 3 (three) times daily. For agitation/neuropathy 10/10/19   Armandina Stammer I, NP  hydrOXYzine (ATARAX/VISTARIL) 25 MG tablet Take 1 tablet (25 mg total) by mouth 3 (three) times daily as needed for anxiety. 10/10/19   Armandina Stammer I, NP  insulin aspart (NOVOLOG) 100 UNIT/ML injection Inject 10 Units into the skin 3 (three) times daily with meals. For diabetes management 10/10/19   Armandina Stammer I, NP  insulin glargine (LANTUS) 100 UNIT/ML injection Inject 0.35 mLs (35 Units total) into the skin daily. For diabetes management 10/11/19   Armandina Stammer I, NP  insulin lispro (HUMALOG KWIKPEN) 100 UNIT/ML KwikPen Inject 3-10 Units into the skin See admin instructions. Inject 3-10  units into the skin three times a day with meals, per sliding scale    [provider]  lactulose (CHRONULAC) 10 GM/15ML solution Take 20 g by mouth See admin instructions. Take 20 grams by mouth two times a day and hold for diarrhea    [provider]  levothyroxine (SYNTHROID, LEVOTHROID) 50 MCG tablet Take 50 mcg by mouth daily before breakfast.  07/25/14   [provider]  lipase/protease/amylase (CREON) 12000-38000 units CPEP capsule Take 12,000 Units by mouth See admin instructions. Take 12,000 units by mouth one to two times a day with snacks    [provider]  megestrol (MEGACE) 40 MG tablet Take 1 tablet (40 mg total) by mouth daily. For Cachexia 10/11/19   Armandina StammerNwoko, Agnes I, NP  mirtazapine (REMERON) 15 MG tablet Take 1 tablet (15 mg total) by  mouth at bedtime. For depression 10/10/19   Armandina StammerNwoko, Agnes I, NP  ondansetron (ZOFRAN-ODT) 4 MG disintegrating tablet Take 4 mg by mouth 3 (three) times daily before meals. Dissolve 4 mg sublingually three times a day before meals and snacks 09/18/16   [provider]  pantoprazole (PROTONIX) 40 MG tablet Take 40 mg by mouth daily before breakfast.  03/21/19   [provider]  tiZANidine (ZANAFLEX) 4 MG tablet Take 4 mg by mouth 3 (three) times daily as needed for muscle spasms.  09/24/19   [provider]  lisinopril (ZESTRIL) 10 MG tablet Take 10 mg by mouth daily. Patient not taking: Reported on 10/02/2019 03/29/19 10/02/19  [provider]  metFORMIN (GLUCOPHAGE) 1000 MG tablet Take 1,000 mg by mouth 2 (two) times daily with a meal.  Patient not taking: Reported on 10/02/2019  10/02/19  [provider]  metoCLOPramide (REGLAN) 10 MG tablet Take 10 mg by mouth 4 (four) times daily.  Patient not taking: Reported on 10/02/2019  10/02/19  [provider]  sertraline (ZOLOFT) 100 MG tablet Take 150 mg by mouth daily. Patient not taking: Reported on 10/02/2019 09/10/16 10/02/19  [provider]  spironolactone (ALDACTONE) 50 MG tablet Take 50 mg by mouth daily. Patient not taking: Reported on 10/02/2019 03/27/19 10/02/19  [provider]  traZODone (DESYREL) 100 MG tablet Take 1 tablet by mouth daily. Patient not taking: Reported on 10/02/2019 09/15/16 10/02/19  [provider]    Allergies    Acetaminophen, Cephalexin, Ciprofloxacin, Hydromorphone, Sulfamethoxazole, Sulfamethoxazole-trimethoprim, Trimethoprim, Dilaudid [hydromorphone hcl], and Hydromorphone hcl  Review of Systems   Review of Systems  All other systems reviewed and are negative.   Physical Exam Updated Vital Signs BP 137/70   Pulse 99   Temp 97.9 F (36.6 C) (Oral)   Resp 18   Ht 5\' 3"  (1.6 m)   Wt 60.8 kg   SpO2 94%   BMI 23.74 kg/m   Physical Exam Vitals  and nursing note reviewed.  Constitutional:      General: She is not in acute distress.    Appearance: Normal appearance. She is normal weight. She is not toxic-appearing.     Comments: Appears chronically ill  HENT:     Head: Normocephalic and atraumatic.     Right Ear: External ear normal.     Left Ear: External ear normal.  Eyes:     Extraocular Movements: Extraocular movements intact.     Pupils: Pupils are equal, round, and reactive to light.     Comments: Faint scleral icterus noted  Cardiovascular:     Rate and Rhythm: Regular rhythm. Tachycardia present.  Pulses: Normal pulses.     Heart sounds: Normal heart sounds.  Pulmonary:     Effort: Pulmonary effort is normal. No respiratory distress.     Breath sounds: Normal breath sounds. No stridor. No wheezing, rhonchi or rales.  Abdominal:     General: There is distension.     Comments: Patient has marked abdominal distention, it feels firm in the right upper abdomen consistent with hepatomegaly.  Musculoskeletal:     Cervical back: Normal range of motion.     Right lower leg: Edema present.     Left lower leg: Edema present.     Comments: Patient is noted to have pitting edema 1+ of her lower extremities up to her knees and even has trace in her thighs bilaterally.  I do not appreciate any pitting edema on her abdominal wall.  Skin:    General: Skin is warm and dry.  Neurological:     General: No focal deficit present.     Mental Status: She is alert and oriented to person, place, and time.     Cranial Nerves: No cranial nerve deficit.  Psychiatric:        Mood and Affect: Mood normal.        Behavior: Behavior normal.        Thought Content: Thought content normal.     ED Results / Procedures / Treatments   Labs (all labs ordered are listed, but only abnormal results are displayed) Results for orders placed or performed during the hospital encounter of 03/30/20  Comprehensive metabolic panel  Result Value Ref  Range   Sodium 140 135 - 145 mmol/L   Potassium 3.4 (L) 3.5 - 5.1 mmol/L   Chloride 106 98 - 111 mmol/L   CO2 27 22 - 32 mmol/L   Glucose, Bld 116 (H) 70 - 99 mg/dL   BUN 8 6 - 20 mg/dL   Creatinine, Ser 6.94 0.44 - 1.00 mg/dL   Calcium 7.9 (L) 8.9 - 10.3 mg/dL   Total Protein 5.0 (L) 6.5 - 8.1 g/dL   Albumin 1.7 (L) 3.5 - 5.0 g/dL   AST 50 (H) 15 - 41 U/L   ALT 21 0 - 44 U/L   Alkaline Phosphatase 98 38 - 126 U/L   Total Bilirubin 3.4 (H) 0.3 - 1.2 mg/dL   GFR, Estimated >85 >46 mL/min   Anion gap 7 5 - 15  CBC with Differential  Result Value Ref Range   WBC 4.2 4.0 - 10.5 K/uL   RBC 2.27 (L) 3.87 - 5.11 MIL/uL   Hemoglobin 7.9 (L) 12.0 - 15.0 g/dL   HCT 27.0 (L) 35.0 - 09.3 %   MCV 110.6 (H) 80.0 - 100.0 fL   MCH 34.8 (H) 26.0 - 34.0 pg   MCHC 31.5 30.0 - 36.0 g/dL   RDW 81.8 (H) 29.9 - 37.1 %   Platelets 59 (L) 150 - 400 K/uL   nRBC 0.0 0.0 - 0.2 %   Neutrophils Relative % 58 %   Neutro Abs 2.5 1.7 - 7.7 K/uL   Lymphocytes Relative 24 %   Lymphs Abs 1.0 0.7 - 4.0 K/uL   Monocytes Relative 11 %   Monocytes Absolute 0.5 0.1 - 1.0 K/uL   Eosinophils Relative 5 %   Eosinophils Absolute 0.2 0.0 - 0.5 K/uL   Basophils Relative 1 %   Basophils Absolute 0.0 0.0 - 0.1 K/uL   RBC Morphology MACROCYTOSIS PRESENT    Immature Granulocytes 1 %  Abs Immature Granulocytes 0.03 0.00 - 0.07 K/uL   Polychromasia PRESENT   Protime-INR  Result Value Ref Range   Prothrombin Time 22.6 (H) 11.4 - 15.2 seconds   INR 2.1 (H) 0.8 - 1.2  APTT  Result Value Ref Range   aPTT 45 (H) 24 - 36 seconds  Ammonia  Result Value Ref Range   Ammonia 59 (H) 9 - 35 umol/L  Lipase, blood  Result Value Ref Range   Lipase 23 11 - 51 U/L  CBG monitoring, ED  Result Value Ref Range   Glucose-Capillary 157 (H) 70 - 99 mg/dL   Laboratory interpretation all normal except mild hypokalemia, minor elevation of 1 LFT, anemia that is new from a baseline of around 10, thrombocytopenia, malnutrition,  elevation of INR and PTT consistent with her liver disease, minor elevation of ammonia level    EKG None  Radiology No results found.  Procedures Procedures (including critical care time)  Medications Ordered in ED Medications - No data to display  ED Course  I have reviewed the triage vital signs and the nursing notes.  Pertinent labs & imaging results that were available during my care of the patient were reviewed by me and considered in my medical decision making (see chart for details).    MDM Rules/Calculators/A&P                          Patient has an insulin drip and a Dexcom on monitor on her abdomen.  Laboratory testing was done.  8:25 AM patient's laboratory test have returned.  She has a new anemia, she did not describe any blood loss she may just have some occult bleeding due to her varices and her underlying liver disease.  She has thrombocytopenia and coagulopathy from her liver disease.  Patient has anasarca and has gained about 33 pounds and has diffuse pitting edema of most to her body.  I will talk to the hospitalist about admission.  I talked to the patient about her anemia, she denies seeing any gross GI bleeding.  She states she has had to get transfusions in the past.  9:22 AM Dr. Laural Benes, hospitalist will admit patient.   Final Clinical Impression(s) / ED Diagnoses Final diagnoses:  Alcoholic cirrhosis of liver with ascites (HCC)  Anasarca  Anemia, unspecified type  Thrombocytopenia (HCC)  Coagulopathy (HCC)    Rx / DC Orders  Plan admission  Devoria Albe, MD, Concha Pyo, MD 03/30/20 [id number]

## 2020-03-31 DIAGNOSIS — R109 Unspecified abdominal pain: Secondary | ICD-10-CM

## 2020-03-31 DIAGNOSIS — E1142 Type 2 diabetes mellitus with diabetic polyneuropathy: Secondary | ICD-10-CM

## 2020-03-31 DIAGNOSIS — K7031 Alcoholic cirrhosis of liver with ascites: Secondary | ICD-10-CM

## 2020-03-31 DIAGNOSIS — R635 Abnormal weight gain: Secondary | ICD-10-CM

## 2020-03-31 DIAGNOSIS — G8929 Other chronic pain: Secondary | ICD-10-CM

## 2020-03-31 LAB — COMPREHENSIVE METABOLIC PANEL
ALT: 20 U/L (ref 0–44)
AST: 49 U/L — ABNORMAL HIGH (ref 15–41)
Albumin: 1.6 g/dL — ABNORMAL LOW (ref 3.5–5.0)
Alkaline Phosphatase: 86 U/L (ref 38–126)
Anion gap: 5 (ref 5–15)
BUN: 9 mg/dL (ref 6–20)
CO2: 27 mmol/L (ref 22–32)
Calcium: 7.8 mg/dL — ABNORMAL LOW (ref 8.9–10.3)
Chloride: 107 mmol/L (ref 98–111)
Creatinine, Ser: 0.46 mg/dL (ref 0.44–1.00)
GFR, Estimated: >60 mL/min (ref >60)
Glucose, Bld: 117 mg/dL — ABNORMAL HIGH (ref 70–99)
Potassium: 3.1 mmol/L — ABNORMAL LOW (ref 3.5–5.1)
Sodium: 139 mmol/L (ref 135–145)
Total Bilirubin: 4 mg/dL — ABNORMAL HIGH (ref 0.3–1.2)
Total Protein: 4.7 g/dL — ABNORMAL LOW (ref 6.5–8.1)

## 2020-03-31 LAB — AMMONIA: Ammonia: 55 umol/L — ABNORMAL HIGH (ref 9–35)

## 2020-03-31 LAB — CBC
HCT: 24.8 % — ABNORMAL LOW (ref 36.0–46.0)
Hemoglobin: 7.8 g/dL — ABNORMAL LOW (ref 12.0–15.0)
MCH: 34.5 pg — ABNORMAL HIGH (ref 26.0–34.0)
MCHC: 31.5 g/dL (ref 30.0–36.0)
MCV: 109.7 fL — ABNORMAL HIGH (ref 80.0–100.0)
Platelets: 53 10*3/uL — ABNORMAL LOW (ref 150–400)
RBC: 2.26 MIL/uL — ABNORMAL LOW (ref 3.87–5.11)
RDW: 16.6 % — ABNORMAL HIGH (ref 11.5–15.5)
WBC: 3.3 10*3/uL — ABNORMAL LOW (ref 4.0–10.5)
nRBC: 0 % (ref 0.0–0.2)

## 2020-03-31 LAB — GLUCOSE, CAPILLARY
Glucose-Capillary: 63 mg/dL — ABNORMAL LOW (ref 70–99)
Glucose-Capillary: 144 mg/dL — ABNORMAL HIGH (ref 70–99)
Glucose-Capillary: 129 mg/dL — ABNORMAL HIGH (ref 70–99)
Glucose-Capillary: 124 mg/dL — ABNORMAL HIGH (ref 70–99)
Glucose-Capillary: 276 mg/dL — ABNORMAL HIGH (ref 70–99)

## 2020-03-31 LAB — MAGNESIUM: Magnesium: 1.5 mg/dL — ABNORMAL LOW (ref 1.7–2.4)

## 2020-03-31 MED ORDER — INSULIN GLARGINE 100 UNIT/ML ~~LOC~~ SOLN
6.0000 [IU] | Freq: Every day | SUBCUTANEOUS | Status: DC
  Administered 2020-03-31 – 2020-04-02 (×3): 6 [IU] via SUBCUTANEOUS
  Filled 2020-03-31 (×6): qty 0.06

## 2020-03-31 MED ORDER — INSULIN ASPART 100 UNIT/ML ~~LOC~~ SOLN
2.0000 [IU] | Freq: Three times a day (TID) | SUBCUTANEOUS | Status: DC
  Administered 2020-03-31 – 2020-04-02 (×5): 2 [IU] via SUBCUTANEOUS

## 2020-03-31 MED ORDER — MAGNESIUM SULFATE 4 GM/100ML IV SOLN
4.0000 g | Freq: Once | INTRAVENOUS | Status: AC
  Administered 2020-03-31: 4 g via INTRAVENOUS
  Filled 2020-03-31: qty 100

## 2020-03-31 MED ORDER — POTASSIUM CHLORIDE CRYS ER 20 MEQ PO TBCR
20.0000 meq | EXTENDED_RELEASE_TABLET | Freq: Two times a day (BID) | ORAL | Status: DC
  Administered 2020-03-31 – 2020-04-02 (×5): 20 meq via ORAL
  Filled 2020-03-31 (×5): qty 1

## 2020-03-31 NOTE — Progress Notes (Signed)
Inpatient Diabetes Program Recommendations  AACE/ADA: New Consensus Statement on Inpatient Glycemic Control (2015)  Target Ranges:  Prepandial:   less than 140 mg/dL      Peak postprandial:   less than 180 mg/dL (1-2 hours)      Critically ill patients:  140 - 180 mg/dL   Lab Results  Component Value Date   GLUCAP 144 (H) 03/31/2020   HGBA1C 4.7 (L) 03/30/2020     To ED with: Anasarca/ Cirrhosis of Liver with Ascites/ Anemia  History: DM, End Stage Cirrhosis  Home DM Meds: Insulin Pump  Current Orders: Lantus 6 units QD, Novolog 2 units TID, Novolog 0-9 units TID, Novolog 0-5 units QHS  Inpatient Diabetes Program Recommendations:    Noted insulin adjustements due to mild hypoglycemic event of 63 mg/dL. In agreement. Following.  Of note, pump trainer from [facility] returned call from 1/10 with the previous pump settings. This is just for documentation.  Basal insulin  0000-0000 0.65 units/hour Total daily basal insulin: 15.6 units/24 hours  Carb Coverage 1:18 1 unit for every 18 grams of carbohydrates  Insulin Sensitivity 1:70 1 unit drops blood glucose 70 mg/dl  Target Glucose Goals 0000-0000 120-120 mg/dl  Thanks, Bronson Curb, MSN, RNC-OB Diabetes Coordinator [phone] (8a-5p)

## 2020-03-31 NOTE — Progress Notes (Signed)
PROGRESS NOTE   [name]  VQQ:[id number] DOB: 25-Apr-1965 DOA: 03/30/2020 PCP: [facility] [place]   Chief Complaint  Patient presents with  . Fluid Retention    Brief Admission History:   55 y.o. 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  She reports increasing abdominal distention and discomfort.  She reports that unfortunately she has had increasing edema since recently starting an insulin pump on March 24, 2020.  She reports that she is taking her lactulose and Lasix medications and not missing doses.  Her last hospitalization in December 4332 was complicated by severe hepatic encephalopathy requiring intubation for short period of time and fortunately she was extubated within 24 hours.  Her prognosis remains very poor.    Assessment & Plan:   Active Problems:   Sleep apnea   Thrombocytopenia (HCC)   Borderline personality disorder (HCC)   Chronic abdominal pain   Cirrhosis of liver (HCC)   Diabetic peripheral neuropathy associated with type 2 diabetes mellitus ([place])   Pharyngoesophageal dysphagia   Essential hypertension   H/O partial thyroidectomy   History of COPD   Hyperbilirubinemia   Hypercholesterolemia   Hypoalbuminemia   Microalbuminuria due to type 2 diabetes mellitus (HCC)   Multinodular thyroid   Normocytic anemia, not due to blood loss   Pancytopenia (HCC)   Postoperative hypothyroidism   Schizoaffective disorder ([place])   Uncontrolled type 2 diabetes mellitus with hyperglycemia, with long-term current use of insulin (HCC)   Gastro-esophageal reflux disease without esophagitis   Ascites   Abnormal weight gain  1. End stage liver disease -  Unfortunately patient is nearing end stages of her liver disease. She presented volume overloaded and she is being treated with IV lasix.   2. GERD - protonix ordered for GI protection. 3. Liver cirrhosis- resumed home lactulose and diuretics. 4. Brittle type 2 diabetes mellitus-patient is insulin requiring and has recently been started on insulin pump (03/24/20).  Holding insulin pump while in the hospital; could resume after discharge.  Conferenced with the diabetes coordinator and we continue Lantus andd NovoLog while in hospital.  Monitor CBG 5 times per day.  Reduced lantus to 6 units and novolog to 2 units given low BS.  5. Ascites- ultrasound fluid survey was completed with not enough fluid seen for paracentesis.  She is empirically being covered for SBP. 6. Schizoaffective disorder-patient no longer taking any of her psychiatric medications for unclear reasons.  She decided that when she went on home hospice those medications were discontinued. 7. Goals of care -palliative medicine consultation requested for assistance with goals of care and advance care planning.  Hopeful to get home in next 1-2 days to resume home hospice.    DVT prophylaxis: SCDs  Code Status: full   Family Communication: fiance at bedside   Disposition Plan: home   Consults called: palliative medicine   Admission status: inpatient   Status is: Inpatient  Remains inpatient appropriate because:IV treatments appropriate due to intensity of illness or inability to take PO and Inpatient level of care appropriate due to severity of illness  Plan is to diurese acutely and get back home on hospice.   Dispo: The patient is from: Home  Anticipated d/c is to: Home              Anticipated d/c date is: 1 day              Patient currently is not medically stable to d/c.  Consultants:   n/a  Procedures:   N/a   Antimicrobials:  zosyn 1/10>>  Subjective: Pt reports she still feels very fluid overloaded  and short of breath with swollen legs and feet.  She is diuresing on IV lasix.   Objective: Vitals:   03/30/20 2143 03/31/20 0150 03/31/20 0558 03/31/20 1324  BP: (!) 132/56 132/65 (!) 102/47 129/66  Pulse: 81 90 91 74  Resp: 17 19 19 20   Temp: 98.5 F (36.9 C) 98.7 F (37.1 C) 98.7 F (37.1 C) 98.4 F (36.9 C)  TempSrc:  Oral  Oral  SpO2: 100% 95% 95% 93%  Weight:   64 kg   Height:        Intake/Output Summary (Last 24 hours) at 03/31/2020 1438 Last data filed at 03/31/2020 0900 Gross per 24 hour  Intake 240 ml  Output --  Net 240 ml   Filed Weights   03/30/20 0421 03/31/20 0558  Weight: 60.8 kg 64 kg    Examination:  General exam: chronically ill appearing female, awake, alert, oriented x 3.   Respiratory system: shallow BS bilateral.  Cardiovascular system: normal S1 & S2 heard. No JVD, murmurs, rubs, gallops or clicks. No pedal edema. Gastrointestinal system: Abdomen is gravid, soft and with generalized diffuse tenderness. No organomegaly or masses felt. Normal bowel sounds heard. Central nervous system: Alert and oriented. No focal neurological deficits. Extremities: 1+ pitting edema BLEs. Symmetric 5 x 5 power. Skin: Jaudiced.  No rashes, lesions or ulcers Psychiatry: Judgement and insight appear normal. Mood & affect appropriate.   Data Reviewed: I have personally reviewed following labs and imaging studies  CBC: Recent Labs  Lab 03/30/20 0650 03/31/20 0504  WBC 4.2 3.3*  NEUTROABS 2.5  --   HGB 7.9* 7.8*  HCT 25.1* 24.8*  MCV 110.6* 109.7*  PLT 59* 53*    Basic Metabolic Panel: Recent Labs  Lab 03/30/20 0650 03/31/20 0504  NA 140 139  K 3.4* 3.1*  CL 106 107  CO2 27 27  GLUCOSE 116* 117*  BUN 8 9  CREATININE 0.52 0.46  CALCIUM 7.9* 7.8*  MG  --  1.5*    GFR: Estimated Creatinine Clearance: 70.7 mL/min (by C-G formula based on SCr of 0.46 mg/dL).  Liver Function Tests: Recent Labs  Lab 03/30/20 0650 03/31/20 0504  AST 50* 49*  ALT  21 20  ALKPHOS 98 86  BILITOT 3.4* 4.0*  PROT 5.0* 4.7*  ALBUMIN 1.7* 1.6*    CBG: Recent Labs  Lab 03/30/20 2026 03/30/20 2256 03/31/20 0246 03/31/20 0729 03/31/20 1113  GLUCAP 88 94 63* 144* 129*    Recent Results (from the past 240 hour(s))  SARS CORONAVIRUS 2 (TAT 6-24 HRS) Nasopharyngeal Nasopharyngeal Swab     Status: None   Collection Time: 03/30/20  6:45 AM   Specimen: Nasopharyngeal Swab  Result Value Ref Range Status   SARS Coronavirus 2 NEGATIVE NEGATIVE Final    Comment: (NOTE) SARS-CoV-2 target nucleic acids are NOT DETECTED.  The SARS-CoV-2 RNA is generally detectable in upper and lower respiratory specimens during the acute phase of infection. Negative results do not preclude SARS-CoV-2 infection, do not rule out co-infections with other pathogens, and should not be used as the sole  basis for treatment or other patient management decisions. Negative results must be combined with clinical observations, patient history, and epidemiological information. The expected result is Negative.  Fact Sheet for Patients: SugarRoll.be  Fact Sheet for Healthcare Providers: https://www.woods-mathews.com/  This test is not yet approved or cleared by the [place] FDA and  has been authorized for detection and/or diagnosis of SARS-CoV-2 by FDA under an Emergency Use Authorization (EUA). This EUA will remain  in effect (meaning this test can be used) for the duration of the COVID-19 declaration under Se ction 564(b)(1) of the Act, 21 U.S.C. section 360bbb-3(b)(1), unless the authorization is terminated or revoked sooner.  Performed at [facility], [place] [address] [place], [place] [address]      Radiology Studies: [place] ASCITES (ABDOMEN LIMITED)  Result Date: 03/30/2020 CLINICAL DATA:  Ascites, assessment for paracentesis EXAM: LIMITED ABDOMEN ULTRASOUND FOR ASCITES TECHNIQUE: Limited ultrasound survey for ascites  was performed in all four abdominal quadrants. COMPARISON:  Ultrasound abdomen 02/21/2020 FINDINGS: Small volume ascites identified in the abdomen. Volume of ascites is insufficient for paracentesis. IMPRESSION: Insufficient ascites for paracentesis. Electronically Signed   By: Lavonia Dana M.D.   On: 03/30/2020 13:25     Scheduled Meds: . furosemide  20 mg Intravenous Q12H  . insulin aspart  0-5 Units Subcutaneous QHS  . insulin aspart  0-9 Units Subcutaneous TID WC  . insulin aspart  2 Units Subcutaneous TID WC  . insulin glargine  6 Units Subcutaneous Daily  . lactulose  20 g Oral BID  . levothyroxine  50 mcg Oral Q0600  . lipase/protease/amylase  12,000 Units Oral TID AC  . potassium chloride  20 mEq Oral Daily  . potassium chloride  20 mEq Oral BID  . spironolactone  12.5 mg Oral Daily   Continuous Infusions: . piperacillin-tazobactam (ZOSYN)  IV 3.375 g (03/31/20 0500)     LOS: 1 day   Time spent: 87 mins   Constantinos Krempasky Wynetta Emery, MD How to contact the [facility] Attending or Consulting provider [place] or covering provider during after hours [place], for this patient?  1. Check the care team in [facility] and look for a) attending/consulting TRH provider listed and b) the [facility] team listed 2. Log into [email or web address] and use [place]'s universal password to access. If you do not have the password, please contact the hospital operator. 3. Locate the [facility] provider you are looking for under [facility] and page to a number that you can be directly reached. 4. If you still have difficulty reaching the provider, please page the [facility] (Director on Call) for the Hospitalists listed on amion for assistance.  03/31/2020, 2:38 PM

## 2020-04-01 DIAGNOSIS — K7460 Unspecified cirrhosis of liver: Secondary | ICD-10-CM

## 2020-04-01 DIAGNOSIS — I10 Essential (primary) hypertension: Secondary | ICD-10-CM

## 2020-04-01 DIAGNOSIS — R188 Other ascites: Secondary | ICD-10-CM

## 2020-04-01 DIAGNOSIS — E1142 Type 2 diabetes mellitus with diabetic polyneuropathy: Secondary | ICD-10-CM | POA: Diagnosis not present

## 2020-04-01 LAB — GLUCOSE, CAPILLARY
Glucose-Capillary: 103 mg/dL — ABNORMAL HIGH (ref 70–99)
Glucose-Capillary: 98 mg/dL (ref 70–99)
Glucose-Capillary: 245 mg/dL — ABNORMAL HIGH (ref 70–99)
Glucose-Capillary: 133 mg/dL — ABNORMAL HIGH (ref 70–99)
Glucose-Capillary: 140 mg/dL — ABNORMAL HIGH (ref 70–99)

## 2020-04-01 LAB — AMMONIA: Ammonia: 71 umol/L — ABNORMAL HIGH (ref 9–35)

## 2020-04-01 LAB — CBC
HCT: 24.1 % — ABNORMAL LOW (ref 36.0–46.0)
Hemoglobin: 7.6 g/dL — ABNORMAL LOW (ref 12.0–15.0)
MCH: 34.5 pg — ABNORMAL HIGH (ref 26.0–34.0)
MCHC: 31.5 g/dL (ref 30.0–36.0)
MCV: 109.5 fL — ABNORMAL HIGH (ref 80.0–100.0)
Platelets: 54 10*3/uL — ABNORMAL LOW (ref 150–400)
RBC: 2.2 MIL/uL — ABNORMAL LOW (ref 3.87–5.11)
RDW: 16 % — ABNORMAL HIGH (ref 11.5–15.5)
WBC: 3.9 10*3/uL — ABNORMAL LOW (ref 4.0–10.5)
nRBC: 0 % (ref 0.0–0.2)

## 2020-04-01 LAB — MAGNESIUM: Magnesium: 1.9 mg/dL (ref 1.7–2.4)

## 2020-04-01 LAB — COMPREHENSIVE METABOLIC PANEL
ALT: 19 U/L (ref 0–44)
AST: 49 U/L — ABNORMAL HIGH (ref 15–41)
Albumin: 1.6 g/dL — ABNORMAL LOW (ref 3.5–5.0)
Alkaline Phosphatase: 82 U/L (ref 38–126)
Anion gap: 5 (ref 5–15)
BUN: 8 mg/dL (ref 6–20)
CO2: 27 mmol/L (ref 22–32)
Calcium: 7.5 mg/dL — ABNORMAL LOW (ref 8.9–10.3)
Chloride: 105 mmol/L (ref 98–111)
Creatinine, Ser: 0.56 mg/dL (ref 0.44–1.00)
GFR, Estimated: >60 mL/min (ref >60)
Glucose, Bld: 89 mg/dL (ref 70–99)
Potassium: 3.3 mmol/L — ABNORMAL LOW (ref 3.5–5.1)
Sodium: 137 mmol/L (ref 135–145)
Total Bilirubin: 3.5 mg/dL — ABNORMAL HIGH (ref 0.3–1.2)
Total Protein: 4.8 g/dL — ABNORMAL LOW (ref 6.5–8.1)

## 2020-04-01 LAB — HEMOGLOBIN AND HEMATOCRIT, BLOOD
HCT: 25.4 % — ABNORMAL LOW (ref 36.0–46.0)
Hemoglobin: 8.1 g/dL — ABNORMAL LOW (ref 12.0–15.0)

## 2020-04-01 MED ORDER — POTASSIUM CHLORIDE CRYS ER 20 MEQ PO TBCR
40.0000 meq | EXTENDED_RELEASE_TABLET | ORAL | Status: AC
  Administered 2020-04-01 (×2): 40 meq via ORAL
  Filled 2020-04-01 (×2): qty 2

## 2020-04-01 MED ORDER — LACTULOSE 10 GM/15ML PO SOLN
30.0000 g | Freq: Three times a day (TID) | ORAL | Status: DC
  Administered 2020-04-01 – 2020-04-02 (×4): 30 g via ORAL
  Filled 2020-04-01 (×3): qty 60

## 2020-04-01 NOTE — Progress Notes (Signed)
PROGRESS NOTE   [name]  [id number] DOB: 1965/05/09 DOA: 03/30/2020 PCP: [facility] [place]   Chief Complaint  Patient presents with  . Fluid Retention    Brief Admission History:   55 y.o. 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  She reports increasing abdominal distention and discomfort.  She reports that unfortunately she has had increasing edema since recently starting an insulin pump on March 24, 2020.  She reports that she is taking her lactulose and Lasix medications and not missing doses.  Her last hospitalization in December 5361 was complicated by severe hepatic encephalopathy requiring intubation for short period of time and fortunately she was extubated within 24 hours.  Her prognosis remains very poor.    Assessment & Plan:   Active Problems:   Sleep apnea   Thrombocytopenia (HCC)   Borderline personality disorder (HCC)   Chronic abdominal pain   Cirrhosis of liver (HCC)   Diabetic peripheral neuropathy associated with type 2 diabetes mellitus ([place])   Pharyngoesophageal dysphagia   Essential hypertension   H/O partial thyroidectomy   History of COPD   Hyperbilirubinemia   Hypercholesterolemia   Hypoalbuminemia   Microalbuminuria due to type 2 diabetes mellitus (HCC)   Multinodular thyroid   Normocytic anemia, not due to blood loss   Pancytopenia (HCC)   Postoperative hypothyroidism   Schizoaffective disorder ([place])   Uncontrolled type 2 diabetes mellitus with hyperglycemia, with long-term current use of insulin (HCC)   Gastro-esophageal reflux disease without esophagitis   Ascites   Abnormal weight gain  1. End stage liver disease -  Unfortunately patient is nearing end stages of her liver disease. She presented volume overloaded and she is being treated with IV lasix.  00- -remains lethargic and ammonia is trending up, will increase lactulose 2. GERD - protonix ordered for GI protection. 3. Liver cirrhosis- resumed home lactulose and diuretics. 4. Brittle type 2 diabetes mellitus-patient is insulin requiring and has recently been started on insulin pump (03/24/20).  Holding insulin pump while in the hospital; could resume after discharge.  Conferenced with the diabetes coordinator and we continue Lantus andd NovoLog while in hospital.  Monitor CBG 5 times per day.  Reduced lantus to 6 units and novolog to 2 units given low BS.  5. Ascites- ultrasound fluid survey was completed with not enough fluid seen for paracentesis.  She is empirically being covered for SBP. 6. Schizoaffective disorder-patient no longer taking any of her psychiatric medications for unclear reasons.  She decided that when she went on home hospice those medications were discontinued. 7. Goals of care -palliative medicine consultation requested for assistance with goals of care and advance care planning.  Hopeful to get home in next 1-2 days to resume home hospice.    DVT prophylaxis: SCDs  Code Status: full   Family Communication: fiance at bedside   Disposition Plan: home   Consults called: palliative medicine   Admission status: inpatient   Status is: Inpatient  Remains inpatient appropriate because:IV treatments appropriate due to intensity of illness or inability to take PO and Inpatient level of care appropriate due to severity of illness  Plan is to diurese acutely and get back home on hospice.   Dispo: The patient is from:  Home              Anticipated d/c is to: Home              Anticipated d/c date is: 1 day              Patient currently is not medically stable to d/c.  Consultants:   n/a  Procedures:   N/a   Antimicrobials:   zosyn 1/10>>  Subjective: Lethargic, sluggish, voiding okay, lower extremity edema persists  Objective: Vitals:   03/31/20 2109 04/01/20 0516 04/01/20 1540 04/01/20 1546  BP: (!) 107/42 (!) 100/54 (!) 127/52 (!) 103/42  Pulse: 92 80 73 80  Resp: 20 17 16 20   Temp: 99.1 F (37.3 C) 98.6 F (37 C) 98.2 F (36.8 C) 98.2 F (36.8 C)  TempSrc: Oral Oral Oral   SpO2: 95% 93% 97% 98%  Weight:  62.7 kg    Height:        Intake/Output Summary (Last 24 hours) at 04/01/2020 1733 Last data filed at 03/31/2020 1830 Gross per 24 hour  Intake 240 ml  Output --  Net 240 ml   Filed Weights   03/30/20 0421 03/31/20 0558 04/01/20 0516  Weight: 60.8 kg 64 kg 62.7 kg    Examination:  General exam: chronically ill appearing female, awake, alert, oriented x 3.   Respiratory system: shallow BS bilateral.  Cardiovascular system: normal S1 & S2 heard. No JVD, murmurs, rubs, gallops or clicks. No pedal edema. Gastrointestinal system: Abdomen is gravid, soft and with generalized diffuse tenderness. No organomegaly or masses felt. Normal bowel sounds heard. Central nervous system: Sluggish at times, forgetful at times no focal neurological deficits. Extremities: 1+ pitting edema BLEs. Symmetric 5 x 5 power. Skin: Jaudiced.  No rashes, lesions or ulcers Psychiatry: Judgement and insight appear normal. Mood & affect appropriate.   Data Reviewed: I have personally reviewed following labs and imaging studies  CBC: Recent Labs  Lab 03/30/20 0650 03/31/20 0504 04/01/20 0431  WBC 4.2 3.3* 3.9*  NEUTROABS 2.5  --   --   HGB 7.9* 7.8* 7.6*  HCT 25.1* 24.8* 24.1*  MCV 110.6* 109.7* 109.5*  PLT 59* 53* 54*    Basic Metabolic Panel: Recent Labs  Lab 03/30/20 0650 03/31/20 0504 04/01/20 0431  NA 140 139 137  K 3.4* 3.1* 3.3*  CL 106 107 105  CO2 27 27 27   GLUCOSE 116* 117* 89  BUN 8 9 8   CREATININE 0.52 0.46 0.56  CALCIUM 7.9* 7.8* 7.5*  MG  --  1.5* 1.9    GFR: Estimated  Creatinine Clearance: 65 mL/min (by C-G formula based on SCr of 0.56 mg/dL).  Liver Function Tests: Recent Labs  Lab 03/30/20 0650 03/31/20 0504 04/01/20 0431  AST 50* 49* 49*  ALT 21 20 19   ALKPHOS 98 86 82  BILITOT 3.4* 4.0* 3.5*  PROT 5.0* 4.7* 4.8*  ALBUMIN 1.7* 1.6* 1.6*    CBG: Recent Labs  Lab 03/31/20 2056 04/01/20 0309 04/01/20 0812 04/01/20 1218 04/01/20 1631  GLUCAP 276* 103* 98 245* 133*    Recent Results (from the past 240 hour(s))  SARS CORONAVIRUS 2 (TAT 6-24 HRS) Nasopharyngeal Nasopharyngeal Swab     Status: None   Collection Time: 03/30/20  6:45 AM   Specimen: Nasopharyngeal Swab  Result Value Ref Range Status   SARS Coronavirus 2 NEGATIVE NEGATIVE Final    Comment: (NOTE) SARS-CoV-2 target nucleic acids are NOT DETECTED.  The SARS-CoV-2 RNA is generally detectable  in upper and lower respiratory specimens during the acute phase of infection. Negative results do not preclude SARS-CoV-2 infection, do not rule out co-infections with other pathogens, and should not be used as the sole basis for treatment or other patient management decisions. Negative results must be combined with clinical observations, patient history, and epidemiological information. The expected result is Negative.  Fact Sheet for Patients: SugarRoll.be  Fact Sheet for Healthcare Providers: https://www.woods-mathews.com/  This test is not yet approved or cleared by the [place] FDA and  has been authorized for detection and/or diagnosis of SARS-CoV-2 by FDA under an Emergency Use Authorization (EUA). This EUA will remain  in effect (meaning this test can be used) for the duration of the COVID-19 declaration under Se ction 564(b)(1) of the Act, 21 U.S.C. section 360bbb-3(b)(1), unless the authorization is terminated or revoked sooner.  Performed at [facility], [place] [address], [place], [place] [address]      Radiology  Studies: No results found.   Scheduled Meds: . furosemide  20 mg Intravenous Q12H  . insulin aspart  0-5 Units Subcutaneous QHS  . insulin aspart  0-9 Units Subcutaneous TID WC  . insulin aspart  2 Units Subcutaneous TID WC  . insulin glargine  6 Units Subcutaneous Daily  . lactulose  30 g Oral TID  . levothyroxine  50 mcg Oral Q0600  . lipase/protease/amylase  12,000 Units Oral TID AC  . potassium chloride  20 mEq Oral Daily  . potassium chloride  20 mEq Oral BID  . spironolactone  12.5 mg Oral Daily   Continuous Infusions: . piperacillin-tazobactam (ZOSYN)  IV 3.375 g (04/01/20 1357)     LOS: 2 days   Roxan Hockey, MD How to contact the [facility] Attending or Consulting provider [place] or covering provider during after hours [place], for this patient?  1. Check the care team in [facility] [place] and look for a) attending/consulting TRH provider listed and b) the [facility] team listed 2. Log into [email or web address] and use [place]'s universal password to access. If you do not have the password, please contact the hospital operator. 3. Locate the [facility] provider you are looking for under [facility] and page to a number that you can be directly reached. 4. If you still have difficulty reaching the provider, please page the [facility] (Director on Call) for the Hospitalists listed on amion for assistance.  04/01/2020, 5:33 PM

## 2020-04-02 DIAGNOSIS — E1142 Type 2 diabetes mellitus with diabetic polyneuropathy: Secondary | ICD-10-CM | POA: Diagnosis not present

## 2020-04-02 DIAGNOSIS — K7460 Unspecified cirrhosis of liver: Secondary | ICD-10-CM | POA: Diagnosis not present

## 2020-04-02 DIAGNOSIS — K7031 Alcoholic cirrhosis of liver with ascites: Secondary | ICD-10-CM | POA: Diagnosis not present

## 2020-04-02 DIAGNOSIS — R109 Unspecified abdominal pain: Secondary | ICD-10-CM | POA: Diagnosis not present

## 2020-04-02 LAB — COMPREHENSIVE METABOLIC PANEL
ALT: 18 U/L (ref 0–44)
AST: 45 U/L — ABNORMAL HIGH (ref 15–41)
Albumin: 1.6 g/dL — ABNORMAL LOW (ref 3.5–5.0)
Alkaline Phosphatase: 81 U/L (ref 38–126)
Anion gap: 3 — ABNORMAL LOW (ref 5–15)
BUN: 9 mg/dL (ref 6–20)
CO2: 28 mmol/L (ref 22–32)
Calcium: 8 mg/dL — ABNORMAL LOW (ref 8.9–10.3)
Chloride: 107 mmol/L (ref 98–111)
Creatinine, Ser: 0.58 mg/dL (ref 0.44–1.00)
GFR, Estimated: >60 mL/min (ref >60)
Glucose, Bld: 155 mg/dL — ABNORMAL HIGH (ref 70–99)
Potassium: 4.2 mmol/L (ref 3.5–5.1)
Sodium: 138 mmol/L (ref 135–145)
Total Bilirubin: 3.1 mg/dL — ABNORMAL HIGH (ref 0.3–1.2)
Total Protein: 4.7 g/dL — ABNORMAL LOW (ref 6.5–8.1)

## 2020-04-02 LAB — GLUCOSE, CAPILLARY
Glucose-Capillary: 167 mg/dL — ABNORMAL HIGH (ref 70–99)
Glucose-Capillary: 123 mg/dL — ABNORMAL HIGH (ref 70–99)
Glucose-Capillary: 169 mg/dL — ABNORMAL HIGH (ref 70–99)

## 2020-04-02 LAB — CBC
HCT: 25.1 % — ABNORMAL LOW (ref 36.0–46.0)
Hemoglobin: 7.8 g/dL — ABNORMAL LOW (ref 12.0–15.0)
MCH: 34.7 pg — ABNORMAL HIGH (ref 26.0–34.0)
MCHC: 31.1 g/dL (ref 30.0–36.0)
MCV: 111.6 fL — ABNORMAL HIGH (ref 80.0–100.0)
Platelets: 55 10*3/uL — ABNORMAL LOW (ref 150–400)
RBC: 2.25 MIL/uL — ABNORMAL LOW (ref 3.87–5.11)
RDW: 15.7 % — ABNORMAL HIGH (ref 11.5–15.5)
WBC: 3.6 10*3/uL — ABNORMAL LOW (ref 4.0–10.5)
nRBC: 0 % (ref 0.0–0.2)

## 2020-04-02 LAB — MAGNESIUM: Magnesium: 1.8 mg/dL (ref 1.7–2.4)

## 2020-04-02 LAB — AMMONIA: Ammonia: 86 umol/L — ABNORMAL HIGH (ref 9–35)

## 2020-04-02 MED ORDER — GABAPENTIN 100 MG PO CAPS: 200.0000 mg | ORAL_CAPSULE | Freq: Three times a day (TID) | ORAL | 1 refills | Status: DC

## 2020-04-02 MED ORDER — POTASSIUM CHLORIDE CRYS ER 20 MEQ PO TBCR: 20.0000 meq | EXTENDED_RELEASE_TABLET | Freq: Two times a day (BID) | ORAL | 3 refills | Status: DC

## 2020-04-02 MED ORDER — PANCRELIPASE (LIP-PROT-AMYL) 12000-38000 UNITS PO CPEP: 12000.0000 [IU] | ORAL_CAPSULE | Freq: Three times a day (TID) | ORAL | 4 refills | Status: DC

## 2020-04-02 MED ORDER — LACTULOSE 10 GM/15ML PO SOLN: 30.0000 g | Freq: Three times a day (TID) | ORAL | 4 refills | Status: DC

## 2020-04-02 MED ORDER — ARIPIPRAZOLE 5 MG PO TABS: 5.0000 mg | ORAL_TABLET | Freq: Every day | ORAL | 3 refills | Status: DC

## 2020-04-02 MED ORDER — ALBUTEROL SULFATE HFA 108 (90 BASE) MCG/ACT IN AERS: 2.0000 | INHALATION_SPRAY | RESPIRATORY_TRACT | 2 refills | Status: DC | PRN

## 2020-04-02 NOTE — Discharge Instructions (Signed)
1)Avoid ibuprofen/Advil/Aleve/Motrin/Goody Powders/Naproxen/BC powders/Meloxicam/Diclofenac/Indomethacin and other Nonsteroidal anti-inflammatory medications as these will make you more likely to bleed and can cause stomach ulcers, can also cause Kidney problems.   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--concerns about lactulose and medication compliance-----please take lactulose and other medications as prescribed  3) please consider re-enrollment in hospice services----your liver failure is pretty advanced, unfortunately you will have flareups from time to time with episodes of worsening confusion and weakness-----it is important that you have somebody with you who can help you when these confusional episodes start to hinder your ability to care for yourself

## 2020-04-02 NOTE — Plan of Care (Signed)
Problem: Education: Goal: Knowledge of General Education information will improve Description: Including pain rating scale, medication(s)/side effects and non-pharmacologic comfort measures 04/02/2020 1201 by Melony Overly, RN Outcome: Adequate for Discharge 04/02/2020 1137 by Melony Overly, RN Outcome: Progressing   Problem: Health Behavior/Discharge Planning: Goal: Ability to manage health-related needs will improve 04/02/2020 1201 by Melony Overly, RN Outcome: Adequate for Discharge 04/02/2020 1137 by Melony Overly, RN Outcome: Progressing   Problem: Clinical Measurements: Goal: Ability to maintain clinical measurements within normal limits will improve 04/02/2020 1201 by Melony Overly, RN Outcome: Adequate for Discharge 04/02/2020 1137 by Melony Overly, RN Outcome: Progressing Goal: Will remain free from infection 04/02/2020 1201 by Melony Overly, RN Outcome: Adequate for Discharge 04/02/2020 1137 by Melony Overly, RN Outcome: Progressing Goal: Diagnostic test results will improve 04/02/2020 1201 by Melony Overly, RN Outcome: Adequate for Discharge 04/02/2020 1137 by Melony Overly, RN Outcome: Progressing Goal: Respiratory complications will improve 04/02/2020 1201 by Melony Overly, RN Outcome: Adequate for Discharge 04/02/2020 1137 by Melony Overly, RN Outcome: Progressing Goal: Cardiovascular complication will be avoided 04/02/2020 1201 by Melony Overly, RN Outcome: Adequate for Discharge 04/02/2020 1137 by Melony Overly, RN Outcome: Progressing   Problem: Activity: Goal: Risk for activity intolerance will decrease 04/02/2020 1201 by Melony Overly, RN Outcome: Adequate for Discharge 04/02/2020 1137 by Melony Overly, RN Outcome: Progressing   Problem: Nutrition: Goal: Adequate nutrition will be maintained 04/02/2020 1201 by Melony Overly, RN Outcome: Adequate for Discharge 04/02/2020 1137 by Melony Overly, RN Outcome: Progressing    Problem: Coping: Goal: Level of anxiety will decrease 04/02/2020 1201 by Melony Overly, RN Outcome: Adequate for Discharge 04/02/2020 1137 by Melony Overly, RN Outcome: Progressing   Problem: Elimination: Goal: Will not experience complications related to bowel motility 04/02/2020 1201 by Melony Overly, RN Outcome: Adequate for Discharge 04/02/2020 1137 by Melony Overly, RN Outcome: Progressing Goal: Will not experience complications related to urinary retention 04/02/2020 1201 by Melony Overly, RN Outcome: Adequate for Discharge 04/02/2020 1137 by Melony Overly, RN Outcome: Progressing   Problem: Pain Managment: Goal: General experience of comfort will improve 04/02/2020 1201 by Melony Overly, RN Outcome: Adequate for Discharge 04/02/2020 1137 by Melony Overly, RN Outcome: Progressing   Problem: Safety: Goal: Ability to remain free from injury will improve 04/02/2020 1201 by Melony Overly, RN Outcome: Adequate for Discharge 04/02/2020 1137 by Melony Overly, RN Outcome: Progressing   Problem: Skin Integrity: Goal: Risk for impaired skin integrity will decrease 04/02/2020 1201 by Melony Overly, RN Outcome: Adequate for Discharge 04/02/2020 1137 by Melony Overly, RN Outcome: Progressing   Problem: Education: Goal: Knowledge of General Education information will improve Description: Including pain rating scale, medication(s)/side effects and non-pharmacologic comfort measures 04/02/2020 1201 by Melony Overly, RN Outcome: Adequate for Discharge 04/02/2020 1137 by Melony Overly, RN Outcome: Progressing   Problem: Health Behavior/Discharge Planning: Goal: Ability to manage health-related needs will improve 04/02/2020 1201 by Melony Overly, RN Outcome: Adequate for Discharge 04/02/2020 1137 by Melony Overly, RN Outcome: Progressing   Problem: Clinical Measurements: Goal: Ability to maintain clinical measurements within normal limits will  improve 04/02/2020 1201 by Melony Overly, RN Outcome: Adequate for Discharge 04/02/2020 1137 by Melony Overly, RN Outcome: Progressing Goal: Will remain free from infection 04/02/2020 1201 by Melony Overly, RN Outcome: Adequate for Discharge 04/02/2020 1137 by  Melony Overly, RN Outcome: Progressing Goal: Diagnostic test results will improve 04/02/2020 1201 by Melony Overly, RN Outcome: Adequate for Discharge 04/02/2020 1137 by Melony Overly, RN Outcome: Progressing Goal: Respiratory complications will improve 04/02/2020 1201 by Melony Overly, RN Outcome: Adequate for Discharge 04/02/2020 1137 by Melony Overly, RN Outcome: Progressing Goal: Cardiovascular complication will be avoided 04/02/2020 1201 by Melony Overly, RN Outcome: Adequate for Discharge 04/02/2020 1137 by Melony Overly, RN Outcome: Progressing   Problem: Activity: Goal: Risk for activity intolerance will decrease 04/02/2020 1201 by Melony Overly, RN Outcome: Adequate for Discharge 04/02/2020 1137 by Melony Overly, RN Outcome: Progressing   Problem: Nutrition: Goal: Adequate nutrition will be maintained 04/02/2020 1201 by Melony Overly, RN Outcome: Adequate for Discharge 04/02/2020 1137 by Melony Overly, RN Outcome: Progressing   Problem: Coping: Goal: Level of anxiety will decrease 04/02/2020 1201 by Melony Overly, RN Outcome: Adequate for Discharge 04/02/2020 1137 by Melony Overly, RN Outcome: Progressing   Problem: Elimination: Goal: Will not experience complications related to bowel motility 04/02/2020 1201 by Melony Overly, RN Outcome: Adequate for Discharge 04/02/2020 1137 by Melony Overly, RN Outcome: Progressing Goal: Will not experience complications related to urinary retention 04/02/2020 1201 by Melony Overly, RN Outcome: Adequate for Discharge 04/02/2020 1137 by Melony Overly, RN Outcome: Progressing   Problem: Pain Managment: Goal: General experience of comfort  will improve 04/02/2020 1201 by Melony Overly, RN Outcome: Adequate for Discharge 04/02/2020 1137 by Melony Overly, RN Outcome: Progressing   Problem: Safety: Goal: Ability to remain free from injury will improve 04/02/2020 1201 by Melony Overly, RN Outcome: Adequate for Discharge 04/02/2020 1137 by Melony Overly, RN Outcome: Progressing   Problem: Skin Integrity: Goal: Risk for impaired skin integrity will decrease 04/02/2020 1201 by Melony Overly, RN Outcome: Adequate for Discharge 04/02/2020 1137 by Melony Overly, RN Outcome: Progressing

## 2020-04-02 NOTE — Care Management Important Message (Signed)
Important Message  Patient Details  Name: [name] MRN: [id number] Date of Birth: [birth year]   Medicare Important Message Given:  Yes     Elliot Gault, LCSW 04/02/2020, 12:58 PM

## 2020-04-02 NOTE — Plan of Care (Signed)

## 2020-04-02 NOTE — Discharge Summary (Signed)
[name], is a 55 y.o. female  DOB 13-Aug-1965  MRN [id number].  Admission date:  03/30/2020  Admitting Physician  Murlean Iba, MD  Discharge Date:  04/02/2020   Primary MD  [facility], [place]  Recommendations for primary care physician for things to follow:   1)Avoid ibuprofen/Advil/Aleve/Motrin/Goody Powders/Naproxen/BC powders/Meloxicam/Diclofenac/Indomethacin and other Nonsteroidal anti-inflammatory medications as these will make you more likely to bleed and can cause stomach ulcers, can also cause Kidney problems.   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--concerns about lactulose and medication compliance-----please take lactulose and other medications as prescribed  3) please consider re-enrollment in hospice services----your liver failure is pretty advanced, unfortunately you will have flareups from time to time with episodes of worsening confusion and weakness-----it is important that you have somebody with you who can help you when these confusional episodes start to hinder your ability to care for yourself  Admission Diagnosis  Anasarca [R60.1] Coagulopathy (HCC) [D68.9] Thrombocytopenia (HCC) [Q11.9] Alcoholic cirrhosis of liver with ascites ([place]) [K70.31] Ascites [R18.8] Anemia, unspecified type [D64.9]   Discharge Diagnosis  Anasarca [R60.1] Coagulopathy ([place]) [D68.9] Thrombocytopenia ([place]) [E17.4] Alcoholic cirrhosis of liver with ascites ([place]) [K70.31] Ascites [R18.8] Anemia, unspecified type [D64.9]    Active Problems:   Sleep apnea   Thrombocytopenia (HCC)   Borderline personality disorder (HCC)   Chronic abdominal pain   Cirrhosis of liver (HCC)   Diabetic peripheral neuropathy associated with type 2 diabetes mellitus ([place])   Pharyngoesophageal dysphagia   Essential hypertension   H/O partial thyroidectomy   History of  COPD   Hyperbilirubinemia   Hypercholesterolemia   Hypoalbuminemia   Microalbuminuria due to type 2 diabetes mellitus ([place])   Multinodular thyroid   Normocytic anemia, not due to blood loss   Pancytopenia (HCC)   Postoperative hypothyroidism   Schizoaffective disorder ([place])   Uncontrolled type 2 diabetes mellitus with hyperglycemia, with long-term current use of insulin (HCC)   Gastro-esophageal reflux disease without esophagitis   Ascites   Abnormal weight gain      Past Medical History:  Diagnosis Date  . Degenerative joint disease   . Diabetes mellitus without complication ([place])   . Gastroesophageal reflux   . Pancreatitis   . Panic attack   . Sleep apnea   . Thrombocytopenia ([place])     Past Surgical History:  Procedure Laterality Date  . CHOLECYSTECTOMY    . SHOULDER SURGERY     left   . tongue sx. titanium screw placed to hold tongue down    . TOTAL THYROIDECTOMY       HPI  from the history and physical done on the day of admission:    Chief Complaint: fluid overloaded  HPI: [name] is a 55 y.o. 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  She reports increasing abdominal distention and discomfort.  She reports that unfortunately she has had increasing edema since recently starting an insulin pump on March 24, 2020.  She reports that she is taking her lactulose and Lasix medications and not missing doses.  Her last hospitalization in December 5361 was complicated by severe hepatic encephalopathy requiring intubation for short period of time and fortunately she was extubated within 24 hours.  Her prognosis remains very poor.  She has intermittent episodes of  confusion and encephalopathy associated with fluctuating ammonia levels.  She has living with her fianc intermittently who has been assisting her with ADLs.  She reports having dry cough, chest congestion and intermittent low-grade fever.  She reports increasing abdominal distention and weight gain of about 30 pounds since Christmas.  Patient notably has poor health literacy and difficulty managing her end-stage liver disease with this level of complexity and difficult time managing medications and following diet and instructions.  Patient reports that she has been able to manage her insulin pump on her own and reports no significant difficulties or problems with severe hypoglycemia at this time.  I am not sure she is the right candidate for an insulin pump given her fluctuating confusion and encephalopathy related to her ammonia levels.  At this time would not hold the insulin pump while in the hospital and she can restarted at discharge follow-up with her prescribing doctor.      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 She reports increasing abdominal distention and discomfort. She reports that unfortunately she has had increasing edema since recently starting an insulin pump on March 24, 2020. She reports that she is taking her lactulose and Lasix medications and not missing doses. Her last hospitalization in December 4431 was complicated by severe hepatic encephalopathy requiring intubation for short period of time and fortunately she was extubated within 24 hours. Her prognosis remains very poor.   Assessment & Plan:   Active  Problems:   Sleep apnea   Thrombocytopenia (HCC)   Borderline personality disorder (HCC)   Chronic abdominal pain   Cirrhosis of liver (HCC)   Diabetic peripheral neuropathy associated with type 2 diabetes mellitus ([place])   Pharyngoesophageal dysphagia   Essential hypertension   H/O partial thyroidectomy   History of COPD   Hyperbilirubinemia   Hypercholesterolemia   Hypoalbuminemia   Microalbuminuria due to type 2 diabetes mellitus (HCC)   Multinodular thyroid   Normocytic anemia, not due to blood loss   Pancytopenia (HCC)   Postoperative hypothyroidism   Schizoaffective disorder ([place])   Uncontrolled type 2 diabetes mellitus with hyperglycemia, with long-term current use of insulin (HCC)   Gastro-esophageal reflux disease without esophagitis   Ascites   Abnormal weight gain   A/p 1)End stage liver disease - Unfortunately patient is nearing end stages of her liver disease. She presented volume overloaded and she was treated with IV lasix.  ---Clinically the patient is much improved, awake, alert, cognitively no concerns, back to baseline from her mentation standpoint, however ammonia levels are trending up -Patient continues to have BMs with lactulose -Long conversation with patient regarding ammonia level trending in the wrong  direction despite patient cognitively and mentally having significant improvement---patient would like to go home, she promises to be compliant with lactulose, she states her boyfriend will stay with her, she is okay with visiting home RN ---She would like to wait a while prior to restarting hospice services again - 2. GERD - ppi 3. Liver cirrhosis-resumed home lactulose and diuretics. 4. Brittle type 2 diabetes mellitus-patient is insulin requiring and has recently been started on insulin pump (03/24/20).--Insulin as ordered 5. Ascites-ultrasound fluid survey was completed with not enough fluid seen for paracentesis. She was empirically being covered for  SBP, clinically low index of suspicion for SBP 6. Schizoaffective disorder-patient admits to noncompliance with antipsychotic medicationshe decided that when she went on home hospice those medications were discontinued. 7. Goals of care --patient states she will reconsider reenrollment in hospice in the near future 8) pancytopenia patient with end-stage liver disease--- overall stable otherwise  9)Disposition--- discharge home with home health RN, patient's boyfriend was stable, ---very very high risk for readmission  DVT prophylaxis:SCDs Code Status:full Family Communication:Boyfriend at bedside Disposition Plan:home Consults called:palliative medicine  Dispo: The patient is from: Home  Anticipated d/c is to: Home with [facility]  Consultants:   n/a  Procedures:   N/a   Antimicrobials:  zosyn 1/10>>  Discharge Condition: stable  Follow UP   [place], [place]. Schedule an appointment as soon as possible for a visit in 2 week(s).   Contact information: [place] [id number] [phone]              Diet and Activity recommendation:  As advised  Discharge Instructions    Discharge Instructions    Call MD for:  difficulty breathing, headache or visual disturbances   Complete by: As directed    Call MD for:  persistant dizziness or light-headedness   Complete by: As directed    Call MD for:  persistant nausea and vomiting   Complete by: As directed    Call MD for:  temperature >100.4   Complete by: As directed    Diet - low sodium heart healthy   Complete by: As directed    Discharge instructions   Complete by: As directed    1)Avoid ibuprofen/Advil/Aleve/Motrin/Goody Powders/Naproxen/BC powders/Meloxicam/Diclofenac/Indomethacin and other Nonsteroidal anti-inflammatory medications as these will make you more likely to bleed and can cause stomach ulcers, can also cause Kidney  problems.   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--concerns about lactulose and medication compliance-----please take lactulose and other medications as prescribed  3) please consider re-enrollment in hospice services----your liver failure is pretty advanced, unfortunately you will have flareups from time to time with episodes of worsening confusion and weakness-----it is important that you have somebody with you who can help you when these confusional episodes start to hinder your ability to care for yourself   Increase activity slowly   Complete by: As directed         Discharge Medications     Allergies as of 04/02/2020      Reactions   Acetaminophen Hives, Other (See Comments)   Tolerates Fioricet   Cephalexin Itching, Swelling, Other (See Comments)   Tongue swells and makes throat itchy- can tolerate Zosyn   Ciprofloxacin Hives   Hydromorphone Hives   Sulfamethoxazole-trimethoprim Shortness Of Breath, Swelling, Other (See Comments)   Tongue swells      Medication List    STOP taking these medications   insulin glargine 100 UNIT/ML injection  Commonly known as: LANTUS   Potassium Chloride ER 20 MEQ Tbcr     TAKE these medications   albuterol (2.5 MG/3ML) 0.083% nebulizer solution Commonly known as: PROVENTIL Inhale 2.5 mg into the lungs every 6 (six) hours as needed for wheezing or shortness of breath.   albuterol 108 (90 Base) MCG/ACT inhaler Commonly known as: VENTOLIN HFA Inhale 2 puffs into the lungs every 4 (four) hours as needed for wheezing or shortness of breath.   ARIPiprazole 5 MG tablet Commonly known as: ABILIFY Take 1 tablet (5 mg total) by mouth daily. For mood control/adjunct to antidepressant   FLUoxetine 20 MG capsule Commonly known as: PROZAC Take 1 capsule (20 mg total) by mouth daily. For depression   furosemide 20 MG tablet Commonly known as: LASIX Take 20 mg by mouth in the morning and at bedtime.    gabapentin 100 MG capsule Commonly known as: NEURONTIN Take 2 capsules (200 mg total) by mouth 3 (three) times daily. For agitation/neuropathy   hydrOXYzine 25 MG tablet Commonly known as: ATARAX/VISTARIL Take 1 tablet (25 mg total) by mouth 3 (three) times daily as needed for anxiety.   insulin aspart 100 UNIT/ML injection Commonly known as: novoLOG Inject 10 Units into the skin 3 (three) times daily with meals. For diabetes management What changed:   how much to take  when to take this  additional instructions   insulin pump Soln Inject into the skin. Novolog   lactulose 10 GM/15ML solution Commonly known as: CHRONULAC Take 45 mLs (30 g total) by mouth 3 (three) times daily. What changed:   how much to take  when to take this  additional instructions   levothyroxine 50 MCG tablet Commonly known as: SYNTHROID Take 50 mcg by mouth daily before breakfast.   lipase/protease/amylase 12000-38000 units Cpep capsule Commonly known as: CREON Take 1 capsule (12,000 Units total) by mouth 3 (three) times daily before meals.   megestrol 40 MG tablet Commonly known as: MEGACE Take 1 tablet (40 mg total) by mouth daily. For Cachexia   mirtazapine 15 MG tablet Commonly known as: REMERON Take 1 tablet (15 mg total) by mouth at bedtime. For depression   Oxycodone HCl 10 MG Tabs Take 10 mg by mouth every 6 (six) hours as needed for severe pain.   potassium chloride SA 20 MEQ tablet Commonly known as: KLOR-CON Take 1 tablet (20 mEq total) by mouth 2 (two) times daily.       Major procedures and Radiology Reports - PLEASE review detailed and final reports for all details, in brief -      [place] ASCITES (ABDOMEN LIMITED)  Result Date: 03/30/2020 CLINICAL DATA:  Ascites, assessment for paracentesis EXAM: LIMITED ABDOMEN ULTRASOUND FOR ASCITES TECHNIQUE: Limited ultrasound survey for ascites was performed in all four abdominal quadrants. COMPARISON:  Ultrasound abdomen  02/21/2020 FINDINGS: Small volume ascites identified in the abdomen. Volume of ascites is insufficient for paracentesis. IMPRESSION: Insufficient ascites for paracentesis. Electronically Signed   By: Lavonia Dana M.D.   On: 03/30/2020 13:25    Micro Results     Recent Results (from the past 240 hour(s))  SARS CORONAVIRUS 2 (TAT 6-24 HRS) Nasopharyngeal Nasopharyngeal Swab     Status: None   Collection Time: 03/30/20  6:45 AM   Specimen: Nasopharyngeal Swab  Result Value Ref Range Status   SARS Coronavirus 2 NEGATIVE NEGATIVE Final    Comment: (NOTE) SARS-CoV-2 target nucleic acids are NOT DETECTED.  The SARS-CoV-2 RNA is generally detectable in  upper and lower respiratory specimens during the acute phase of infection. Negative results do not preclude SARS-CoV-2 infection, do not rule out co-infections with other pathogens, and should not be used as the sole basis for treatment or other patient management decisions. Negative results must be combined with clinical observations, patient history, and epidemiological information. The expected result is Negative.  Fact Sheet for Patients: SugarRoll.be  Fact Sheet for Healthcare Providers: https://www.woods-mathews.com/  This test is not yet approved or cleared by the [place] FDA and  has been authorized for detection and/or diagnosis of SARS-CoV-2 by FDA under an Emergency Use Authorization (EUA). This EUA will remain  in effect (meaning this test can be used) for the duration of the COVID-19 declaration under Se ction 564(b)(1) of the Act, 21 U.S.C. section 360bbb-3(b)(1), unless the authorization is terminated or revoked sooner.  Performed at [facility], [place] [address] [place], [place] [address]        Today   Subjective    Nicholette Dolson today has no new complaints, -Eating well -having bowel movements           Patient has been seen and examined prior to  discharge   Objective   Blood pressure (!) 113/47, pulse 88, temperature 98.7 F (37.1 C), resp. rate 17, height 5' 3"  (1.6 m), weight 62.7 kg, SpO2 97 %.  No intake or output data in the 24 hours ending 04/02/20 1218  Exam General exam: chronically ill appearing female, awake, alert, oriented x 3.   Respiratory system: Clear bilaterally, no adventitious sounds  cardiovascular system: normal S1 & S2 heard.  2/6 systolic murmur Gastrointestinal system: Abdomen slightly distended, soft and with generalized diffuse tenderness.  Normal bowel sounds heard. Central nervous system:  Alert and oriented x3, no focal neurological deficits. Extremities: trace pitting edema BLEs. Symmetric 5 x 5 power. Skin: Jaudiced.  No rashes, lesions or ulcers Psychiatry: Judgement and insight appear normal. Mood & affect appropriate.   Data Review   CBC w Diff:  Lab Results  Component Value Date   WBC 3.6 (L) 04/02/2020   HGB 7.8 (L) 04/02/2020   HCT 25.1 (L) 04/02/2020   PLT 55 (L) 04/02/2020   LYMPHOPCT 24 03/30/2020   MONOPCT 11 03/30/2020   EOSPCT 5 03/30/2020   BASOPCT 1 03/30/2020    CMP:  Lab Results  Component Value Date   NA 138 04/02/2020   K 4.2 04/02/2020   CL 107 04/02/2020   CO2 28 04/02/2020   BUN 9 04/02/2020   CREATININE 0.58 04/02/2020   PROT 4.7 (L) 04/02/2020   ALBUMIN 1.6 (L) 04/02/2020   BILITOT 3.1 (H) 04/02/2020   ALKPHOS 81 04/02/2020   AST 45 (H) 04/02/2020   ALT 18 04/02/2020  .   Total Discharge time is about 33 minutes  Roxan Hockey M.D on 04/02/2020 at 12:18 PM  Go to [email or web address] -  for contact info  [facility] - Office  [phone]

## 2020-04-02 NOTE — Progress Notes (Signed)
Patient made staff aware of blood in her undergarments and stool.  Notified mid-level and a hemoglobin was performed before midnight and was stable at 8.1.   Patient went to the restroom again, and had small drops of blood  in stool.  Notified MD and received order for occult blood in stool. Patient otherwise stable.  Patient displayed anxiety due to blood being in stool with her history of medical problems.  MD made aware, will continue to monitor patient at this time.

## 2020-04-02 NOTE — TOC Transition Note (Signed)
Transition of Care [facility]) - CM/SW Discharge Note   Patient Details  Name: ZANIAH TITTERINGTON MRN: [id number] Date of Birth: 1965-12-25  Transition of Care [facility]) CM/SW Contact:  Elliot Gault, LCSW Phone Number: 04/02/2020, 12:40 PM   Clinical Narrative:     Pt stable for dc today per MD. Pt informed MD that she would like to dc home with [facility]. Spoke with pt by phone to review dc planning. Pt states she would like HH arranged. Discussed in network provider options with pt and referred as requested. [facility] will follow up with pt at home. Pt did not identify any other DC planning needs. She did ask for chaplain to come up to notarize some documents. Updated Chaplain Leola Brazil who will see pt.  No other TOC needs.  Final next level of care: Home w Home Health Services Barriers to Discharge: Barriers Resolved   Patient Goals and CMS Choice Patient states their goals for this hospitalization and ongoing recovery are:: go home with home health CMS Medicare.gov Compare Post Acute Care list provided to:: Patient Choice offered to / list presented to : Patient  Discharge Placement                       Discharge Plan and Services                          HH Arranged: RN [facility] Agency: [facility] Care Date [facility] Agency Contacted: 04/02/20   Representative spoke with at [facility] Agency: Kandee Keen  Social Determinants of Health (SDOH) Interventions     Readmission Risk Interventions No flowsheet data found.

## 2020-04-08 DIAGNOSIS — D689 Coagulation defect, unspecified: Secondary | ICD-10-CM

## 2020-04-08 DIAGNOSIS — D649 Anemia, unspecified: Secondary | ICD-10-CM

## 2020-04-08 DIAGNOSIS — D539 Nutritional anemia, unspecified: Secondary | ICD-10-CM

## 2020-04-28 ENCOUNTER — Emergency Department (HOSPITAL_COMMUNITY): Payer: Medicare HMO

## 2020-04-28 ENCOUNTER — Emergency Department (HOSPITAL_COMMUNITY)
Admission: EM | Admit: 2020-04-28 | Discharge: 2020-04-28 | Disposition: A | Discharge status: Home / Self Care | Payer: Medicare HMO | Attending: Emergency Medicine | Admitting: Emergency Medicine

## 2020-04-28 ENCOUNTER — Encounter (HOSPITAL_COMMUNITY): Payer: Self-pay | Admitting: Emergency Medicine

## 2020-04-28 ENCOUNTER — Other Ambulatory Visit: Payer: Self-pay

## 2020-04-28 DIAGNOSIS — E039 Hypothyroidism, unspecified: Secondary | ICD-10-CM | POA: Insufficient documentation

## 2020-04-28 DIAGNOSIS — Z7984 Long term (current) use of oral hypoglycemic drugs: Secondary | ICD-10-CM | POA: Insufficient documentation

## 2020-04-28 DIAGNOSIS — R110 Nausea: Secondary | ICD-10-CM | POA: Diagnosis not present

## 2020-04-28 DIAGNOSIS — R1084 Generalized abdominal pain: Secondary | ICD-10-CM | POA: Insufficient documentation

## 2020-04-28 DIAGNOSIS — Z8616 Personal history of COVID-19: Secondary | ICD-10-CM | POA: Diagnosis not present

## 2020-04-28 DIAGNOSIS — Z79899 Other long term (current) drug therapy: Secondary | ICD-10-CM | POA: Diagnosis not present

## 2020-04-28 DIAGNOSIS — I10 Essential (primary) hypertension: Secondary | ICD-10-CM | POA: Insufficient documentation

## 2020-04-28 DIAGNOSIS — E1165 Type 2 diabetes mellitus with hyperglycemia: Secondary | ICD-10-CM | POA: Diagnosis not present

## 2020-04-28 DIAGNOSIS — Z794 Long term (current) use of insulin: Secondary | ICD-10-CM | POA: Diagnosis not present

## 2020-04-28 LAB — CBC WITH DIFFERENTIAL/PLATELET
Abs Immature Granulocytes: 0.02 10*3/uL (ref 0.00–0.07)
Basophils Absolute: 0 10*3/uL (ref 0.0–0.1)
Basophils Relative: 1 %
Eosinophils Absolute: 0.1 10*3/uL (ref 0.0–0.5)
Eosinophils Relative: 3 %
HCT: 27.5 % — ABNORMAL LOW (ref 36.0–46.0)
Hemoglobin: 9 g/dL — ABNORMAL LOW (ref 12.0–15.0)
Immature Granulocytes: 1 %
Lymphocytes Relative: 10 %
Lymphs Abs: 0.4 10*3/uL — ABNORMAL LOW (ref 0.7–4.0)
MCH: 34.6 pg — ABNORMAL HIGH (ref 26.0–34.0)
MCHC: 32.7 g/dL (ref 30.0–36.0)
MCV: 105.8 fL — ABNORMAL HIGH (ref 80.0–100.0)
Monocytes Absolute: 0.4 10*3/uL (ref 0.1–1.0)
Monocytes Relative: 10 %
Neutro Abs: 3 10*3/uL (ref 1.7–7.7)
Neutrophils Relative %: 75 %
Platelets: 60 10*3/uL — ABNORMAL LOW (ref 150–400)
RBC: 2.6 MIL/uL — ABNORMAL LOW (ref 3.87–5.11)
RDW: 13.9 % (ref 11.5–15.5)
WBC: 4 10*3/uL (ref 4.0–10.5)
nRBC: 0 % (ref 0.0–0.2)

## 2020-04-28 LAB — COMPREHENSIVE METABOLIC PANEL
ALT: 19 U/L (ref 0–44)
AST: 47 U/L — ABNORMAL HIGH (ref 15–41)
Albumin: 2.2 g/dL — ABNORMAL LOW (ref 3.5–5.0)
Alkaline Phosphatase: 89 U/L (ref 38–126)
Anion gap: 5 (ref 5–15)
BUN: 9 mg/dL (ref 6–20)
CO2: 24 mmol/L (ref 22–32)
Calcium: 8.6 mg/dL — ABNORMAL LOW (ref 8.9–10.3)
Chloride: 104 mmol/L (ref 98–111)
Creatinine, Ser: 0.5 mg/dL (ref 0.44–1.00)
GFR, Estimated: >60 mL/min (ref >60)
Glucose, Bld: 194 mg/dL — ABNORMAL HIGH (ref 70–99)
Potassium: 4.3 mmol/L (ref 3.5–5.1)
Sodium: 133 mmol/L — ABNORMAL LOW (ref 135–145)
Total Bilirubin: 3.1 mg/dL — ABNORMAL HIGH (ref 0.3–1.2)
Total Protein: 5.8 g/dL — ABNORMAL LOW (ref 6.5–8.1)

## 2020-04-28 LAB — URINALYSIS, ROUTINE W REFLEX MICROSCOPIC
Bacteria, UA: NONE SEEN
Bilirubin Urine: NEGATIVE
Glucose, UA: NEGATIVE mg/dL
Ketones, ur: NEGATIVE mg/dL
Leukocytes,Ua: NEGATIVE
Nitrite: NEGATIVE
Protein, ur: NEGATIVE mg/dL
Specific Gravity, Urine: 1.02 (ref 1.005–1.030)
pH: 8 (ref 5.0–8.0)

## 2020-04-28 LAB — LIPASE, BLOOD: Lipase: 34 U/L (ref 11–51)

## 2020-04-28 LAB — LACTIC ACID, PLASMA: Lactic Acid, Venous: 1.8 mmol/L (ref 0.5–1.9)

## 2020-04-28 LAB — CBG MONITORING, ED: Glucose-Capillary: 169 mg/dL — ABNORMAL HIGH (ref 70–99)

## 2020-04-28 MED ORDER — ONDANSETRON HCL 4 MG/2ML IJ SOLN
4.0000 mg | Freq: Once | INTRAMUSCULAR | Status: AC
  Administered 2020-04-28: 4 mg via INTRAVENOUS
  Filled 2020-04-28: qty 2

## 2020-04-28 MED ORDER — IOHEXOL 300 MG/ML  SOLN
100.0000 mL | Freq: Once | INTRAMUSCULAR | Status: AC | PRN
  Administered 2020-04-28: 75 mL via INTRAVENOUS

## 2020-04-28 MED ORDER — SODIUM CHLORIDE 0.9 % IV BOLUS (SEPSIS)
500.0000 mL | Freq: Once | INTRAVENOUS | Status: AC
  Administered 2020-04-28: 500 mL via INTRAVENOUS

## 2020-04-28 MED ORDER — ONDANSETRON 4 MG PO TBDP: ORAL_TABLET | ORAL | 1 refills | Status: DC

## 2020-04-28 NOTE — Discharge Instructions (Addendum)
Take Zofran for nausea. Follow-up with your gastroenterologist within a week. Keep track of your sugars. Take your own pain medicine as prescribed

## 2020-04-28 NOTE — ED Triage Notes (Signed)
Pt states she woke up this morning with CBG of 53 and nauseous.   Pt drank juice and ate candy, CBG 169. Pt still c/o of nausea, generalized abdominal pain. HX of liver failure

## 2020-04-28 NOTE — ED Provider Notes (Signed)
[place] Provider Note   CSN: [id number] Arrival date & time: 04/28/20  8756     History Chief Complaint  Patient presents with  . Abdominal Pain  . Nausea    [name] is a 55 y.o. female.  Patient has a history of cirrhosis. She complains of nausea and some abdominal discomfort.  The history is provided by the patient and medical records. No language interpreter was used.  Abdominal Pain Pain location:  Generalized Pain quality: aching   Pain radiates to:  Does not radiate Pain severity:  Mild Onset quality:  Gradual Timing:  Intermittent Progression:  Waxing and waning Chronicity:  Recurrent Associated symptoms: nausea   Associated symptoms: no chest pain, no cough, no diarrhea, no fatigue and no hematuria        Past Medical History:  Diagnosis Date  . Degenerative joint disease   . Diabetes mellitus without complication ([place])   . Gastroesophageal reflux   . Pancreatitis   . Panic attack   . Sleep apnea   . Thrombocytopenia [facility])     Patient Active Problem List   Diagnosis Date Noted  . Anemia   . Coagulopathy ([place])   . Ascites 03/30/2020  . Abnormal weight gain 03/30/2020  . Severe recurrent major depressive disorder with psychotic features ([place]) 10/05/2019  . Major depressive disorder, single episode, severe with psychotic features ([place]) 10/04/2019  . Abnormal head CT 04/02/2019  . Multiple falls 04/02/2019  . Stroke-like symptoms 04/02/2019  . Panic attack 03/30/2019  . History of COPD 03/20/2019  . Chronic abdominal pain 02/05/2019  . DKA (diabetic ketoacidoses) 01/26/2019  . Sleep apnea   . Gastroesophageal reflux   . Hepatitis A 01/13/2019  . 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[place]) 01/12/2019  . COVID-19 01/12/2019  . Glycosuria 01/12/2019  . Hyperbilirubinemia 01/12/2019  . Hypoalbuminemia 01/12/2019  . Normocytic anemia, not due to blood loss 01/12/2019  . Uncontrolled type 2 diabetes mellitus  with hyperglycemia, with long-term current use of insulin ([place]) 01/12/2019  . Low TSH level 01/12/2019  . Chest pain 12/16/2018  . Left-sided weakness 12/16/2018  . Weight loss 12/16/2018  . Schizoaffective disorder ([place]) 03/10/2018  . Essential hypertension 03/09/2018  . H/O partial thyroidectomy 10/25/2017  . Thyroid nodule 10/25/2017  . Cricopharyngeal hypertrophy 09/26/2017  . Pharyngoesophageal dysphagia 09/26/2017  . Reaction to severe stress 09/11/2017  . Left hemiparesis ([place]) 04/18/2017  . Disorder of shoulder 03/03/2017  . Lactic acidosis 09/27/2016  . Pancytopenia ([place]) 09/27/2016  . Acquired hypothyroidism 09/27/2016  . Microalbuminuria due to type 2 diabetes mellitus ([place]) 04/20/2016  . Diabetic peripheral neuropathy associated with type 2 diabetes mellitus ([place]) 12/03/2015  . Hepatic steatosis 12/03/2015  . Diabetes mellitus without complication ([place]) 43/32/9518  . Moderate episode of recurrent major depressive disorder ([place]) 03/20/2015  . Complicated UTI (urinary tract infection) 12/25/2014  . Vaginitis and vulvovaginitis 11/06/2014  . Blurry vision, bilateral 10/17/2014  . Health care maintenance 10/17/2014  . Nausea 10/17/2014  . Multinodular thyroid 07/01/2014  . Cirrhosis of liver ([place]) 03/05/2014  . Postoperative hypothyroidism 03/05/2014  . Type 2 diabetes mellitus ([place]) 03/05/2014  . Thrombocytopenia ([place]) 01/17/2014  . Alcoholic cirrhosis of liver without ascites ([place]) 01/17/2014  . Back problem 05/28/2013  . Low back pain 04/12/2013  . Borderline personality disorder ([place]) 08/25/2012  . Depression 08/25/2012  . Hypercholesterolemia 08/25/2012  . Osteoarthrosis 08/25/2012  . Gastro-esophageal reflux disease without esophagitis 08/25/2012  . Obstructive sleep apnea 08/25/2012  Past Surgical History:  Procedure Laterality Date  . CHOLECYSTECTOMY    . SHOULDER SURGERY     left   . tongue sx. titanium screw placed to hold tongue down    . TOTAL  THYROIDECTOMY       OB History   No obstetric history on file.     History reviewed. No pertinent family history.  Social History   Tobacco Use  . Smoking status: Never Smoker  . Smokeless tobacco: Never Used  Vaping Use  . Vaping Use: Never used  Substance Use Topics  . Alcohol use: No  . Drug use: No    Home Medications Prior to Admission medications   Medication Sig Start Date End Date Taking? Authorizing Provider  ondansetron (ZOFRAN ODT) 4 MG disintegrating tablet 75m ODT q4 hours prn nausea/vomit 04/28/20  Yes ZMilton Ferguson MD  albuterol (PROVENTIL) (2.5 MG/3ML) 0.083% nebulizer solution Inhale 2.5 mg into the lungs every 6 (six) hours as needed for wheezing or shortness of breath.  04/19/19   [provider]  albuterol (VENTOLIN HFA) 108 (90 Base) MCG/ACT inhaler Inhale 2 puffs into the lungs every 4 (four) hours as needed for wheezing or shortness of breath. 04/02/20   ERoxan Hockey MD  ARIPiprazole (ABILIFY) 5 MG tablet Take 1 tablet (5 mg total) by mouth daily. For mood control/adjunct to antidepressant 04/02/20   ERoxan Hockey MD  FLUoxetine (PROZAC) 20 MG capsule Take 1 capsule (20 mg total) by mouth daily. For depression Patient not taking: No sig reported 10/11/19   NLindell SparI, NP  furosemide (LASIX) 20 MG tablet Take 20 mg by mouth in the morning and at bedtime.    [provider]  gabapentin (NEURONTIN) 100 MG capsule Take 2 capsules (200 mg total) by mouth 3 (three) times daily. For agitation/neuropathy 04/02/20   ERoxan Hockey MD  hydrOXYzine (ATARAX/VISTARIL) 25 MG tablet Take 1 tablet (25 mg total) by mouth 3 (three) times daily as needed for anxiety. Patient not taking: Reported on 03/30/2020 10/10/19   NLindell SparI, NP  insulin aspart (NOVOLOG) 100 UNIT/ML injection Inject 10 Units into the skin 3 (three) times daily with meals. For diabetes management Patient taking differently: Inject into the skin. Via Insulin Pump 10/10/19    NLindell SparI, NP  Insulin Human (INSULIN PUMP) SOLN Inject into the skin. Novolog    [provider]  lactulose (CHRONULAC) 10 GM/15ML solution Take 45 mLs (30 g total) by mouth 3 (three) times daily. 04/02/20   ERoxan Hockey MD  levothyroxine (SYNTHROID, LEVOTHROID) 50 MCG tablet Take 50 mcg by mouth daily before breakfast.  07/25/14   [provider]  lipase/protease/amylase (CREON) 12000-38000 units CPEP capsule Take 1 capsule (12,000 Units total) by mouth 3 (three) times daily before meals. 04/02/20   ERoxan Hockey MD  megestrol (MEGACE) 40 MG tablet Take 1 tablet (40 mg total) by mouth daily. For Cachexia Patient not taking: No sig reported 10/11/19   NLindell SparI, NP  mirtazapine (REMERON) 15 MG tablet Take 1 tablet (15 mg total) by mouth at bedtime. For depression Patient not taking: No sig reported 10/10/19   NLindell SparI, NP  Oxycodone HCl 10 MG TABS Take 10 mg by mouth every 6 (six) hours as needed for severe pain. 03/06/20   [provider]  potassium chloride SA (KLOR-CON) 20 MEQ tablet Take 1 tablet (20 mEq total) by mouth 2 (two) times daily. 04/02/20   ERoxan Hockey MD  lisinopril (ZESTRIL) 10  MG tablet Take 10 mg by mouth daily. Patient not taking: Reported on 10/02/2019 03/29/19 10/02/19  [provider]  metFORMIN (GLUCOPHAGE) 1000 MG tablet Take 1,000 mg by mouth 2 (two) times daily with a meal.  Patient not taking: Reported on 10/02/2019  10/02/19  [provider]  metoCLOPramide (REGLAN) 10 MG tablet Take 10 mg by mouth 4 (four) times daily.  Patient not taking: Reported on 10/02/2019  10/02/19  [provider]  sertraline (ZOLOFT) 100 MG tablet Take 150 mg by mouth daily. Patient not taking: Reported on 10/02/2019 09/10/16 10/02/19  [provider]  spironolactone (ALDACTONE) 50 MG tablet Take 50 mg by mouth daily. Patient not taking: Reported on 10/02/2019 03/27/19 10/02/19  [provider]  traZODone  (DESYREL) 100 MG tablet Take 1 tablet by mouth daily. Patient not taking: Reported on 10/02/2019 09/15/16 10/02/19  [provider]    Allergies    Acetaminophen, Cephalexin, Ciprofloxacin, Hydromorphone, and Sulfamethoxazole-trimethoprim  Review of Systems   Review of Systems  Constitutional: Negative for appetite change and fatigue.  HENT: Negative for congestion, ear discharge and sinus pressure.   Eyes: Negative for discharge.  Respiratory: Negative for cough.   Cardiovascular: Negative for chest pain.  Gastrointestinal: Positive for abdominal pain and nausea. Negative for diarrhea.  Genitourinary: Negative for frequency and hematuria.  Musculoskeletal: Negative for back pain.  Skin: Negative for rash.  Neurological: Negative for seizures and headaches.  Psychiatric/Behavioral: Negative for hallucinations.    Physical Exam Updated Vital Signs BP (!) 119/58   Pulse 86   Temp 98.9 F (37.2 C) (Oral)   Resp 14   Ht 5' 3"  (1.6 m)   Wt 62.6 kg   SpO2 94%   BMI 24.45 kg/m   Physical Exam Vitals and nursing note reviewed.  Constitutional:      Appearance: Normal appearance. She is well-developed.  HENT:     Head: Normocephalic.     Nose: Nose normal.  Eyes:     General: No scleral icterus.    Extraocular Movements: EOM normal.     Conjunctiva/sclera: Conjunctivae normal.  Neck:     Thyroid: No thyromegaly.  Cardiovascular:     Rate and Rhythm: Normal rate and regular rhythm.     Heart sounds: No murmur heard. No friction rub. No gallop.   Pulmonary:     Breath sounds: No stridor. No wheezing or rales.  Chest:     Chest wall: No tenderness.  Abdominal:     General: There is no distension.     Tenderness: There is abdominal tenderness. There is no rebound.     Comments: Minimal general tenderness  Musculoskeletal:        General: No edema. Normal range of motion.     Cervical back: Neck supple.  Lymphadenopathy:     Cervical: No cervical adenopathy.   Skin:    Findings: No erythema or rash.  Neurological:     Mental Status: She is alert and oriented to person, place, and time.     Motor: No abnormal muscle tone.     Coordination: Coordination normal.  Psychiatric:        Mood and Affect: Mood and affect normal.        Behavior: Behavior normal.     ED Results / Procedures / Treatments   Labs (all labs ordered are listed, but only abnormal results are displayed) Labs Reviewed  CBC WITH DIFFERENTIAL/PLATELET - Abnormal; Notable for the following components:  Result Value   RBC 2.60 (*)    Hemoglobin 9.0 (*)    HCT 27.5 (*)    MCV 105.8 (*)    MCH 34.6 (*)    Platelets 60 (*)    Lymphs Abs 0.4 (*)    All other components within normal limits  COMPREHENSIVE METABOLIC PANEL - Abnormal; Notable for the following components:   Sodium 133 (*)    Glucose, Bld 194 (*)    Calcium 8.6 (*)    Total Protein 5.8 (*)    Albumin 2.2 (*)    AST 47 (*)    Total Bilirubin 3.1 (*)    All other components within normal limits  URINALYSIS, ROUTINE W REFLEX MICROSCOPIC - Abnormal; Notable for the following components:   Hgb urine dipstick MODERATE (*)    All other components within normal limits  CBG MONITORING, ED - Abnormal; Notable for the following components:   Glucose-Capillary 169 (*)    All other components within normal limits  URINE CULTURE  LIPASE, BLOOD  LACTIC ACID, PLASMA    EKG None  Radiology CT ABDOMEN PELVIS W CONTRAST  Result Date: 04/28/2020 CLINICAL DATA:  Generalized abdominal pain EXAM: CT ABDOMEN AND PELVIS WITH CONTRAST TECHNIQUE: Multidetector CT imaging of the abdomen and pelvis was performed using the standard protocol following bolus administration of intravenous contrast. CONTRAST:  11m OMNIPAQUE IOHEXOL 300 MG/ML  SOLN COMPARISON:  03/30/2020 ultrasound, 09/27/2019 FINDINGS: Lower chest: Lung bases are free of acute infiltrate or sizable effusion. Hepatobiliary: Gallbladder has been surgically  removed. Prominence of the biliary tree is noted consistent with the post cholecystectomy state. The liver is shrunken and nodular consistent with underlying cirrhosis. The overall appearance is stable when compared with the prior exam. Stable cyst is noted in the lateral aspect of the right lobe of the liver. Pancreas: Unremarkable. No pancreatic ductal dilatation or surrounding inflammatory changes. Spleen: Spleen is enlarged consistent portal hypertension. The splenic vein is prominent and tortuous with evidence of a spontaneous splenorenal shunt Adrenals/Urinary Tract: Adrenal glands are within normal limits bilaterally. Kidneys demonstrate a normal enhancement pattern. A few scattered small cysts are noted. No renal calculi are seen. No obstructive changes are noted. The bladder is partially distended. Stomach/Bowel: No obstructive or inflammatory changes of the colon are seen. The appendix is not well visualized. No inflammatory changes to suggest appendicitis are seen. The stomach is decompressed. The small bowel shows no obstructive changes. Mild hyperemia is noted related to the portal hypertension. Vascular/Lymphatic: Atherosclerotic calcifications are noted without aneurysmal dilatation. Changes of portal hypertension are noted with spontaneous splenorenal decompression as well as perisplenic varices. No definitive esophageal or gastric varices are seen. Reproductive: Uterus and bilateral adnexa are unremarkable. Other: Minimal ascites is noted consistent with the portal hypertension. No radiation is noted. Musculoskeletal: No acute or significant osseous findings. IMPRESSION: Changes consistent with cirrhosis of the liver with portal hypertension and splenomegaly as well as varices as described. Mild ascites related to the portal hypertension. No other focal abnormality is noted. Electronically Signed   By: MInez CatalinaM.D.   On: 04/28/2020 11:20    Procedures Procedures   Medications Ordered in  ED Medications  ondansetron (ZOFRAN) injection 4 mg (4 mg Intravenous Given 04/28/20 1008)  sodium chloride 0.9 % bolus 500 mL (0 mLs Intravenous Stopped 04/28/20 1051)  iohexol (OMNIPAQUE) 300 MG/ML solution 100 mL (75 mLs Intravenous Contrast Given 04/28/20 1053)    ED Course  I have reviewed the triage vital signs and  the nursing notes.  Pertinent labs & imaging results that were available during my care of the patient were reviewed by me and considered in my medical decision making (see chart for details).    MDM Rules/Calculators/A&P                         Patient with cirrhosis. Her nausea symptoms improved with Zofran. Labs showed no acute changes. CT shows mild ascites with portal hypertension and varices. Patient symptoms are improved with Zofran and she will be discharged home follow-up with GI doctor Final Clinical Impression(s) / ED Diagnoses Final diagnoses:  Nausea    Rx / DC Orders ED Discharge Orders         Ordered    ondansetron (ZOFRAN ODT) 4 MG disintegrating tablet        04/28/20 1321           Milton Ferguson, MD 04/28/20 1327

## 2020-04-30 LAB — URINE CULTURE: Culture: >=100000 — AB

## 2020-05-09 ENCOUNTER — Emergency Department (HOSPITAL_COMMUNITY)
Admission: EM | Admit: 2020-05-09 | Discharge: 2020-05-09 | Disposition: A | Discharge status: Home / Self Care | Payer: Medicare HMO | Attending: Emergency Medicine | Admitting: Emergency Medicine

## 2020-05-09 ENCOUNTER — Other Ambulatory Visit: Payer: Self-pay

## 2020-05-09 ENCOUNTER — Encounter (HOSPITAL_COMMUNITY): Payer: Self-pay

## 2020-05-09 DIAGNOSIS — J449 Chronic obstructive pulmonary disease, unspecified: Secondary | ICD-10-CM | POA: Diagnosis not present

## 2020-05-09 DIAGNOSIS — Z8616 Personal history of COVID-19: Secondary | ICD-10-CM | POA: Insufficient documentation

## 2020-05-09 DIAGNOSIS — R112 Nausea with vomiting, unspecified: Secondary | ICD-10-CM | POA: Insufficient documentation

## 2020-05-09 DIAGNOSIS — R739 Hyperglycemia, unspecified: Secondary | ICD-10-CM

## 2020-05-09 DIAGNOSIS — R110 Nausea: Secondary | ICD-10-CM

## 2020-05-09 DIAGNOSIS — Z794 Long term (current) use of insulin: Secondary | ICD-10-CM | POA: Diagnosis not present

## 2020-05-09 DIAGNOSIS — Z79899 Other long term (current) drug therapy: Secondary | ICD-10-CM | POA: Diagnosis not present

## 2020-05-09 DIAGNOSIS — E039 Hypothyroidism, unspecified: Secondary | ICD-10-CM | POA: Insufficient documentation

## 2020-05-09 DIAGNOSIS — Z7984 Long term (current) use of oral hypoglycemic drugs: Secondary | ICD-10-CM | POA: Diagnosis not present

## 2020-05-09 DIAGNOSIS — R109 Unspecified abdominal pain: Secondary | ICD-10-CM | POA: Diagnosis not present

## 2020-05-09 DIAGNOSIS — E1165 Type 2 diabetes mellitus with hyperglycemia: Secondary | ICD-10-CM | POA: Diagnosis not present

## 2020-05-09 DIAGNOSIS — I10 Essential (primary) hypertension: Secondary | ICD-10-CM | POA: Insufficient documentation

## 2020-05-09 LAB — I-STAT CHEM 8, ED
BUN: 10 mg/dL (ref 6–20)
Calcium, Ion: 1.21 mmol/L (ref 1.15–1.40)
Chloride: 100 mmol/L (ref 98–111)
Creatinine, Ser: 0.5 mg/dL (ref 0.44–1.00)
Glucose, Bld: 158 mg/dL — ABNORMAL HIGH (ref 70–99)
HCT: 29 % — ABNORMAL LOW (ref 36.0–46.0)
Hemoglobin: 9.9 g/dL — ABNORMAL LOW (ref 12.0–15.0)
Potassium: 3.8 mmol/L (ref 3.5–5.1)
Sodium: 137 mmol/L (ref 135–145)
TCO2: 26 mmol/L (ref 22–32)

## 2020-05-09 LAB — CBG MONITORING, ED: Glucose-Capillary: 219 mg/dL — ABNORMAL HIGH (ref 70–99)

## 2020-05-09 MED ORDER — ONDANSETRON 8 MG PO TBDP
8.0000 mg | ORAL_TABLET | Freq: Once | ORAL | Status: AC
  Administered 2020-05-09: 8 mg via ORAL
  Filled 2020-05-09: qty 1

## 2020-05-09 MED ORDER — ONDANSETRON 4 MG PO TBDP: 4.0000 mg | ORAL_TABLET | Freq: Three times a day (TID) | ORAL | 0 refills | Status: DC | PRN

## 2020-05-09 NOTE — ED Provider Notes (Signed)
[facility] EMERGENCY DEPARTMENT Provider Note   CSN: [id number] Arrival date & time: 05/09/20  0000     History Chief Complaint  Patient presents with  . Hyperglycemia  . Nausea    [name] is a 55 y.o. female.  HPI    Patient with extensive history including alcoholic cirrhosis, diabetes, thrombocytopenia presents with nausea.  Patient reports she has had difficulty with nausea and eating correctly.  She will have occasional vomiting.  She reports chronic abdominal pain.  She also reports she been having difficulty controlling her blood glucose with her insulin pump.  She reports that her doctor told her that she is "dying "from her liver disease. No chest pain is reported.  No other acute complaints Past Medical History:  Diagnosis Date  . Degenerative joint disease   . Diabetes mellitus without complication ([place])   . Gastroesophageal reflux   . Pancreatitis   . Panic attack   . Sleep apnea   . Thrombocytopenia [facility])     Patient Active Problem List   Diagnosis Date Noted  . Anemia   . Coagulopathy ([place])   . Ascites 03/30/2020  . Abnormal weight gain 03/30/2020  . Severe recurrent major depressive disorder with psychotic features ([place]) 10/05/2019  . Major depressive disorder, single episode, severe with psychotic features ([place]) 10/04/2019  . Abnormal head CT 04/02/2019  . Multiple falls 04/02/2019  . Stroke-like symptoms 04/02/2019  . Panic attack 03/30/2019  . History of COPD 03/20/2019  . Chronic abdominal pain 02/05/2019  . DKA (diabetic ketoacidoses) 01/26/2019  . Sleep apnea   . Gastroesophageal reflux   . Hepatitis A 01/13/2019  . 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[place]) 01/12/2019  . COVID-19 01/12/2019  . Glycosuria 01/12/2019  . Hyperbilirubinemia 01/12/2019  . Hypoalbuminemia 01/12/2019  . Normocytic anemia, not due to blood loss 01/12/2019  . Uncontrolled type 2 diabetes mellitus with hyperglycemia, with long-term current use of  insulin ([place]) 01/12/2019  . Low TSH level 01/12/2019  . Chest pain 12/16/2018  . Left-sided weakness 12/16/2018  . Weight loss 12/16/2018  . Schizoaffective disorder ([place]) 03/10/2018  . Essential hypertension 03/09/2018  . H/O partial thyroidectomy 10/25/2017  . Thyroid nodule 10/25/2017  . Cricopharyngeal hypertrophy 09/26/2017  . Pharyngoesophageal dysphagia 09/26/2017  . Reaction to severe stress 09/11/2017  . Left hemiparesis ([place]) 04/18/2017  . Disorder of shoulder 03/03/2017  . Lactic acidosis 09/27/2016  . Pancytopenia ([place]) 09/27/2016  . Acquired hypothyroidism 09/27/2016  . Microalbuminuria due to type 2 diabetes mellitus ([place]) 04/20/2016  . Diabetic peripheral neuropathy associated with type 2 diabetes mellitus ([place]) 12/03/2015  . Hepatic steatosis 12/03/2015  . Diabetes mellitus without complication ([place]) 25/85/2778  . Moderate episode of recurrent major depressive disorder ([place]) 03/20/2015  . Complicated UTI (urinary tract infection) 12/25/2014  . Vaginitis and vulvovaginitis 11/06/2014  . Blurry vision, bilateral 10/17/2014  . Health care maintenance 10/17/2014  . Nausea 10/17/2014  . Multinodular thyroid 07/01/2014  . Cirrhosis of liver ([place]) 03/05/2014  . Postoperative hypothyroidism 03/05/2014  . Type 2 diabetes mellitus ([place]) 03/05/2014  . Thrombocytopenia ([place]) 01/17/2014  . Alcoholic cirrhosis of liver without ascites ([place]) 01/17/2014  . Back problem 05/28/2013  . Low back pain 04/12/2013  . Borderline personality disorder ([place]) 08/25/2012  . Depression 08/25/2012  . Hypercholesterolemia 08/25/2012  . Osteoarthrosis 08/25/2012  . Gastro-esophageal reflux disease without esophagitis 08/25/2012  . Obstructive sleep apnea 08/25/2012    Past Surgical History:  Procedure Laterality Date  . CHOLECYSTECTOMY    .  SHOULDER SURGERY     left   . tongue sx. titanium screw placed to hold tongue down    . TOTAL THYROIDECTOMY       OB History   No obstetric  history on file.     No family history on file.  Social History   Tobacco Use  . Smoking status: Never Smoker  . Smokeless tobacco: Never Used  Vaping Use  . Vaping Use: Never used  Substance Use Topics  . Alcohol use: No  . Drug use: No    Home Medications Prior to Admission medications   Medication Sig Start Date End Date Taking? Authorizing Provider  ondansetron (ZOFRAN ODT) 4 MG disintegrating tablet Take 1 tablet (4 mg total) by mouth every 8 (eight) hours as needed. 05/09/20  Yes Ripley Fraise, MD  albuterol (PROVENTIL) (2.5 MG/3ML) 0.083% nebulizer solution Inhale 2.5 mg into the lungs every 6 (six) hours as needed for wheezing or shortness of breath.  04/19/19   [provider]  albuterol (VENTOLIN HFA) 108 (90 Base) MCG/ACT inhaler Inhale 2 puffs into the lungs every 4 (four) hours as needed for wheezing or shortness of breath. 04/02/20   Roxan Hockey, MD  ARIPiprazole (ABILIFY) 5 MG tablet Take 1 tablet (5 mg total) by mouth daily. For mood control/adjunct to antidepressant 04/02/20   Roxan Hockey, MD  FLUoxetine (PROZAC) 20 MG capsule Take 1 capsule (20 mg total) by mouth daily. For depression Patient not taking: No sig reported 10/11/19   Lindell Spar I, NP  furosemide (LASIX) 20 MG tablet Take 20 mg by mouth in the morning and at bedtime.    [provider]  gabapentin (NEURONTIN) 100 MG capsule Take 2 capsules (200 mg total) by mouth 3 (three) times daily. For agitation/neuropathy 04/02/20   Roxan Hockey, MD  hydrOXYzine (ATARAX/VISTARIL) 25 MG tablet Take 1 tablet (25 mg total) by mouth 3 (three) times daily as needed for anxiety. Patient not taking: Reported on 03/30/2020 10/10/19   Lindell Spar I, NP  insulin aspart (NOVOLOG) 100 UNIT/ML injection Inject 10 Units into the skin 3 (three) times daily with meals. For diabetes management Patient taking differently: Inject into the skin. Via Insulin Pump 10/10/19   Lindell Spar I, NP  Insulin Human  (INSULIN PUMP) SOLN Inject into the skin. Novolog    [provider]  lactulose (CHRONULAC) 10 GM/15ML solution Take 45 mLs (30 g total) by mouth 3 (three) times daily. 04/02/20   Roxan Hockey, MD  levothyroxine (SYNTHROID, LEVOTHROID) 50 MCG tablet Take 50 mcg by mouth daily before breakfast.  07/25/14   [provider]  lipase/protease/amylase (CREON) 12000-38000 units CPEP capsule Take 1 capsule (12,000 Units total) by mouth 3 (three) times daily before meals. 04/02/20   Roxan Hockey, MD  megestrol (MEGACE) 40 MG tablet Take 1 tablet (40 mg total) by mouth daily. For Cachexia Patient not taking: No sig reported 10/11/19   Lindell Spar I, NP  mirtazapine (REMERON) 15 MG tablet Take 1 tablet (15 mg total) by mouth at bedtime. For depression Patient not taking: No sig reported 10/10/19   Lindell Spar I, NP  ondansetron (ZOFRAN ODT) 4 MG disintegrating tablet 13m ODT q4 hours prn nausea/vomit 04/28/20   ZMilton Ferguson MD  Oxycodone HCl 10 MG TABS Take 10 mg by mouth every 6 (six) hours as needed for severe pain. 03/06/20   [provider]  potassium chloride SA (KLOR-CON) 20 MEQ tablet Take 1 tablet (20 mEq total) by mouth 2 (  two) times daily. 04/02/20   Roxan Hockey, MD  lisinopril (ZESTRIL) 10 MG tablet Take 10 mg by mouth daily. Patient not taking: Reported on 10/02/2019 03/29/19 10/02/19  [provider]  metFORMIN (GLUCOPHAGE) 1000 MG tablet Take 1,000 mg by mouth 2 (two) times daily with a meal.  Patient not taking: Reported on 10/02/2019  10/02/19  [provider]  metoCLOPramide (REGLAN) 10 MG tablet Take 10 mg by mouth 4 (four) times daily.  Patient not taking: Reported on 10/02/2019  10/02/19  [provider]  sertraline (ZOLOFT) 100 MG tablet Take 150 mg by mouth daily. Patient not taking: Reported on 10/02/2019 09/10/16 10/02/19  [provider]  spironolactone (ALDACTONE) 50 MG tablet Take 50 mg by mouth daily. Patient not  taking: Reported on 10/02/2019 03/27/19 10/02/19  [provider]  traZODone (DESYREL) 100 MG tablet Take 1 tablet by mouth daily. Patient not taking: Reported on 10/02/2019 09/15/16 10/02/19  [provider]    Allergies    Acetaminophen, Cephalexin, Ciprofloxacin, Hydromorphone, and Sulfamethoxazole-trimethoprim  Review of Systems   Review of Systems  Constitutional: Positive for fatigue. Negative for fever.  Cardiovascular: Negative for chest pain.  Gastrointestinal: Positive for nausea and vomiting.       Chronic abdominal  All other systems reviewed and are negative.   Physical Exam Updated Vital Signs BP (!) 147/57 (BP Location: Right Arm)   Pulse 85   Temp 98 F (36.7 C) (Oral)   Resp 18   Ht 1.6 m (5' 3" )   Wt 62.6 kg   SpO2 94%   BMI 24.45 kg/m   Physical Exam CONSTITUTIONAL: Elderly appears older than stated age HEAD: Normocephalic/atraumatic EYES: EOMI/PERRL no icterus ENMT: Mucous membranes moist NECK: supple no meningeal signs SPINE/BACK:entire spine nontender CV: S1/S2 noted, no murmurs/rubs/gallops noted LUNGS: Lungs are clear to auscultation bilaterally, no apparent distress ABDOMEN: soft, mild RUQ tenderness, hepatomegaly noted, no rebound or guarding, bowel sounds noted throughout abdomen GU:no cva tenderness NEURO: Pt is awake/alert/appropriate, moves all extremitiesx4.  No facial droop.   EXTREMITIES: pulses normal/equal, full ROM SKIN: warm, chronic skin changes noted to the lower extremities PSYCH: Anxious and tearful  ED Results / Procedures / Treatments   Labs (all labs ordered are listed, but only abnormal results are displayed) Labs Reviewed  CBG MONITORING, ED - Abnormal; Notable for the following components:      Result Value   Glucose-Capillary 219 (*)    All other components within normal limits  I-STAT CHEM 8, ED - Abnormal; Notable for the following components:   Glucose, Bld 158 (*)    Hemoglobin 9.9 (*)    HCT 29.0  (*)    All other components within normal limits    EKG EKG Interpretation  Date/Time:  Saturday May 09 2020 04:05:14 EST Ventricular Rate:  85 PR Interval:    QRS Duration: 98 QT Interval:  400 QTC Calculation: 476 R Axis:   73 Text Interpretation: Sinus rhythm Probable left ventricular hypertrophy Interpretation limited secondary to artifact No significant change since last tracing Confirmed by Ripley Fraise [phone]) on 05/09/2020 4:12:00 AM   Radiology No results found.  Procedures Procedures   Medications Ordered in ED Medications  ondansetron (ZOFRAN-ODT) disintegrating tablet 8 mg (8 mg Oral Given 05/09/20 0406)    ED Course  I have reviewed the triage vital signs and the nursing notes.  Pertinent labs results that were available during my care of the patient were reviewed by me and considered in my  medical decision making (see chart for details).    MDM Rules/Calculators/A&P                          Patient presents because she has been increasing nauseous and difficulty eating.  Patient has advanced cirrhosis and has been referred to palliative care.  She claims that she has not received any recent Zofran prescription Will check labs and reassess.  Patient is overall in no acute distress  At time of discharge: Patient feels improved after Zofran.  She is in no acute distress.  No vomiting.  No new complaints.  Will discharge home Final Clinical Impression(s) / ED Diagnoses Final diagnoses:  Nausea  Hyperglycemia    Rx / DC Orders ED Discharge Orders         Ordered    ondansetron (ZOFRAN ODT) 4 MG disintegrating tablet  Every 8 hours PRN        05/09/20 0413           Ripley Fraise, MD 05/09/20 [phone]

## 2020-05-09 NOTE — ED Triage Notes (Signed)
Pt states she is nauseated and the last time she went to the doctor, he was supposed to give her nausea medicine, but did not. Pt requesting to get nausea medication.

## 2020-05-21 ENCOUNTER — Observation Stay (HOSPITAL_COMMUNITY)
Admission: EM | Admit: 2020-05-21 | Discharge: 2020-05-22 | Disposition: A | Discharge status: Home / Self Care | Payer: Medicare HMO | Attending: Internal Medicine | Admitting: Internal Medicine

## 2020-05-21 ENCOUNTER — Other Ambulatory Visit: Payer: Self-pay

## 2020-05-21 ENCOUNTER — Emergency Department (HOSPITAL_COMMUNITY): Payer: Medicare HMO

## 2020-05-21 ENCOUNTER — Encounter (HOSPITAL_COMMUNITY): Payer: Self-pay | Admitting: Emergency Medicine

## 2020-05-21 DIAGNOSIS — E1140 Type 2 diabetes mellitus with diabetic neuropathy, unspecified: Secondary | ICD-10-CM

## 2020-05-21 DIAGNOSIS — E039 Hypothyroidism, unspecified: Secondary | ICD-10-CM | POA: Insufficient documentation

## 2020-05-21 DIAGNOSIS — Z7984 Long term (current) use of oral hypoglycemic drugs: Secondary | ICD-10-CM | POA: Insufficient documentation

## 2020-05-21 DIAGNOSIS — F259 Schizoaffective disorder, unspecified: Secondary | ICD-10-CM | POA: Diagnosis present

## 2020-05-21 DIAGNOSIS — I10 Essential (primary) hypertension: Secondary | ICD-10-CM | POA: Diagnosis not present

## 2020-05-21 DIAGNOSIS — Z8616 Personal history of COVID-19: Secondary | ICD-10-CM | POA: Diagnosis not present

## 2020-05-21 DIAGNOSIS — R410 Disorientation, unspecified: Secondary | ICD-10-CM | POA: Diagnosis present

## 2020-05-21 DIAGNOSIS — K7290 Hepatic failure, unspecified without coma: Secondary | ICD-10-CM | POA: Diagnosis present

## 2020-05-21 DIAGNOSIS — Z79899 Other long term (current) drug therapy: Secondary | ICD-10-CM | POA: Diagnosis not present

## 2020-05-21 DIAGNOSIS — K7030 Alcoholic cirrhosis of liver without ascites: Secondary | ICD-10-CM | POA: Diagnosis present

## 2020-05-21 DIAGNOSIS — K7210 Chronic hepatic failure without coma: Secondary | ICD-10-CM

## 2020-05-21 DIAGNOSIS — Z794 Long term (current) use of insulin: Secondary | ICD-10-CM | POA: Insufficient documentation

## 2020-05-21 DIAGNOSIS — K7291 Hepatic failure, unspecified with coma: Principal | ICD-10-CM | POA: Insufficient documentation

## 2020-05-21 DIAGNOSIS — R5381 Other malaise: Secondary | ICD-10-CM

## 2020-05-21 DIAGNOSIS — K7682 Hepatic encephalopathy: Secondary | ICD-10-CM | POA: Diagnosis present

## 2020-05-21 DIAGNOSIS — Z20822 Contact with and (suspected) exposure to covid-19: Secondary | ICD-10-CM | POA: Insufficient documentation

## 2020-05-21 DIAGNOSIS — E119 Type 2 diabetes mellitus without complications: Secondary | ICD-10-CM | POA: Diagnosis not present

## 2020-05-21 DIAGNOSIS — J449 Chronic obstructive pulmonary disease, unspecified: Secondary | ICD-10-CM | POA: Insufficient documentation

## 2020-05-21 DIAGNOSIS — E8809 Other disorders of plasma-protein metabolism, not elsewhere classified: Secondary | ICD-10-CM | POA: Diagnosis present

## 2020-05-21 LAB — COMPREHENSIVE METABOLIC PANEL
ALT: 20 U/L (ref 0–44)
AST: 48 U/L — ABNORMAL HIGH (ref 15–41)
Albumin: 2.2 g/dL — ABNORMAL LOW (ref 3.5–5.0)
Alkaline Phosphatase: 91 U/L (ref 38–126)
Anion gap: 4 — ABNORMAL LOW (ref 5–15)
BUN: 13 mg/dL (ref 6–20)
CO2: 26 mmol/L (ref 22–32)
Calcium: 8.6 mg/dL — ABNORMAL LOW (ref 8.9–10.3)
Chloride: 103 mmol/L (ref 98–111)
Creatinine, Ser: 0.45 mg/dL (ref 0.44–1.00)
GFR, Estimated: >60 mL/min (ref >60)
Glucose, Bld: 181 mg/dL — ABNORMAL HIGH (ref 70–99)
Potassium: 4 mmol/L (ref 3.5–5.1)
Sodium: 133 mmol/L — ABNORMAL LOW (ref 135–145)
Total Bilirubin: 3.9 mg/dL — ABNORMAL HIGH (ref 0.3–1.2)
Total Protein: 6 g/dL — ABNORMAL LOW (ref 6.5–8.1)

## 2020-05-21 LAB — CBC WITH DIFFERENTIAL/PLATELET
Abs Immature Granulocytes: 0.01 10*3/uL (ref 0.00–0.07)
Basophils Absolute: 0 10*3/uL (ref 0.0–0.1)
Basophils Relative: 1 %
Eosinophils Absolute: 0.1 10*3/uL (ref 0.0–0.5)
Eosinophils Relative: 4 %
HCT: 27.7 % — ABNORMAL LOW (ref 36.0–46.0)
Hemoglobin: 9.1 g/dL — ABNORMAL LOW (ref 12.0–15.0)
Immature Granulocytes: 0 %
Lymphocytes Relative: 23 %
Lymphs Abs: 0.8 10*3/uL (ref 0.7–4.0)
MCH: 34.1 pg — ABNORMAL HIGH (ref 26.0–34.0)
MCHC: 32.9 g/dL (ref 30.0–36.0)
MCV: 103.7 fL — ABNORMAL HIGH (ref 80.0–100.0)
Monocytes Absolute: 0.4 10*3/uL (ref 0.1–1.0)
Monocytes Relative: 11 %
Neutro Abs: 2 10*3/uL (ref 1.7–7.7)
Neutrophils Relative %: 61 %
Platelets: 53 10*3/uL — ABNORMAL LOW (ref 150–400)
RBC: 2.67 MIL/uL — ABNORMAL LOW (ref 3.87–5.11)
RDW: 15.5 % (ref 11.5–15.5)
WBC: 3.3 10*3/uL — ABNORMAL LOW (ref 4.0–10.5)
nRBC: 0 % (ref 0.0–0.2)

## 2020-05-21 LAB — PROTIME-INR
INR: 1.8 — ABNORMAL HIGH (ref 0.8–1.2)
Prothrombin Time: 20.4 seconds — ABNORMAL HIGH (ref 11.4–15.2)

## 2020-05-21 LAB — AMMONIA: Ammonia: 140 umol/L — ABNORMAL HIGH (ref 9–35)

## 2020-05-21 LAB — LIPASE, BLOOD: Lipase: 38 U/L (ref 11–51)

## 2020-05-21 MED ORDER — MORPHINE SULFATE (PF) 2 MG/ML IV SOLN: 2.0000 mg | INTRAVENOUS | Status: DC | PRN

## 2020-05-21 MED ORDER — PANCRELIPASE (LIP-PROT-AMYL) 12000-38000 UNITS PO CPEP
12000.0000 [IU] | ORAL_CAPSULE | Freq: Three times a day (TID) | ORAL | Status: DC
  Administered 2020-05-22 (×3): 12000 [IU] via ORAL
  Filled 2020-05-21 (×4): qty 1

## 2020-05-21 MED ORDER — LACTULOSE 10 GM/15ML PO SOLN
30.0000 g | Freq: Three times a day (TID) | ORAL | Status: DC
  Administered 2020-05-22 (×2): 30 g via ORAL
  Filled 2020-05-21 (×3): qty 60

## 2020-05-21 MED ORDER — LACTULOSE 10 GM/15ML PO SOLN
30.0000 g | Freq: Once | ORAL | Status: AC
  Administered 2020-05-21: 30 g via ORAL
  Filled 2020-05-21: qty 60

## 2020-05-21 MED ORDER — GABAPENTIN 100 MG PO CAPS
200.0000 mg | ORAL_CAPSULE | Freq: Three times a day (TID) | ORAL | Status: DC
  Administered 2020-05-22 (×3): 200 mg via ORAL
  Filled 2020-05-21 (×3): qty 2

## 2020-05-21 MED ORDER — ALBUTEROL SULFATE HFA 108 (90 BASE) MCG/ACT IN AERS: 2.0000 | INHALATION_SPRAY | RESPIRATORY_TRACT | Status: DC | PRN

## 2020-05-21 MED ORDER — IOHEXOL 300 MG/ML  SOLN
100.0000 mL | Freq: Once | INTRAMUSCULAR | Status: AC | PRN
  Administered 2020-05-21: 100 mL via INTRAVENOUS

## 2020-05-21 MED ORDER — FUROSEMIDE 20 MG PO TABS
20.0000 mg | ORAL_TABLET | Freq: Every day | ORAL | Status: DC
  Administered 2020-05-22: 20 mg via ORAL
  Filled 2020-05-21: qty 1

## 2020-05-21 MED ORDER — HYDROXYZINE HCL 25 MG PO TABS: 25.0000 mg | ORAL_TABLET | Freq: Three times a day (TID) | ORAL | Status: DC | PRN

## 2020-05-21 MED ORDER — MIRTAZAPINE 15 MG PO TABS
15.0000 mg | ORAL_TABLET | Freq: Every day | ORAL | Status: DC
Start: 2020-05-21 — End: 2020-05-22

## 2020-05-21 MED ORDER — ARIPIPRAZOLE 5 MG PO TABS: 5.0000 mg | ORAL_TABLET | Freq: Every day | ORAL | Status: DC

## 2020-05-21 MED ORDER — ONDANSETRON HCL 4 MG/2ML IJ SOLN
4.0000 mg | Freq: Four times a day (QID) | INTRAMUSCULAR | Status: DC | PRN
  Administered 2020-05-22: 4 mg via INTRAVENOUS
  Filled 2020-05-21: qty 2

## 2020-05-21 MED ORDER — SODIUM CHLORIDE 0.9 % IV BOLUS
500.0000 mL | Freq: Once | INTRAVENOUS | Status: AC
  Administered 2020-05-21: 500 mL via INTRAVENOUS

## 2020-05-21 MED ORDER — FLUOXETINE HCL 20 MG PO CAPS: 20.0000 mg | ORAL_CAPSULE | Freq: Every day | ORAL | Status: DC

## 2020-05-21 MED ORDER — IBUPROFEN 400 MG PO TABS: 400.0000 mg | ORAL_TABLET | Freq: Four times a day (QID) | ORAL | Status: DC | PRN

## 2020-05-21 MED ORDER — OXYCODONE HCL 5 MG PO TABS
5.0000 mg | ORAL_TABLET | ORAL | Status: DC | PRN
  Administered 2020-05-22: 5 mg via ORAL
  Filled 2020-05-21: qty 1

## 2020-05-21 MED ORDER — HEPARIN SODIUM (PORCINE) 5000 UNIT/ML IJ SOLN: 5000.0000 [IU] | Freq: Three times a day (TID) | INTRAMUSCULAR | Status: DC

## 2020-05-21 MED ORDER — LEVOTHYROXINE SODIUM 50 MCG PO TABS: 50.0000 ug | ORAL_TABLET | Freq: Every day | ORAL | Status: DC

## 2020-05-21 MED ORDER — ONDANSETRON HCL 4 MG PO TABS: 4.0000 mg | ORAL_TABLET | Freq: Four times a day (QID) | ORAL | Status: DC | PRN

## 2020-05-21 MED ORDER — LEVOTHYROXINE SODIUM 50 MCG PO TABS
50.0000 ug | ORAL_TABLET | Freq: Every day | ORAL | Status: DC
  Administered 2020-05-22: 50 ug via ORAL
  Filled 2020-05-21: qty 1

## 2020-05-21 NOTE — ED Triage Notes (Signed)
Pt reports she has liver failure and feels her ammonia level is high due to increased confusion x 3-4 days; pt a&o x 4 in triage; pt further reports generalized abd pain and nausea after a fall Tuesday

## 2020-05-21 NOTE — ED Provider Notes (Signed)
[facility] EMERGENCY DEPARTMENT Provider Note   CSN: [id number] Arrival date & time: 05/21/20  1756     History Chief Complaint  Patient presents with  . Altered Mental Status    [name] is a 55 y.o. female.  55 year old female with history of DM, hepatic encephalopathy, pancreatitis, additional history as listed below, presents with concern for possible elevated ammonia level as well as fall with abdominal pain.  Patient states that she was told that her pancreas is failing and her spleen is enlarged, states that she had a mechanical fall several days ago landing on her abdomen and is concerned that she may have internal injuries, has been having nausea and vomiting at times.  Denies hitting her head or loss of consciousness.  Patient reports compliance with her 45 of lactulose 3 times daily.  Denies abdominal distention.  No other complaints or concerns.        Past Medical History:  Diagnosis Date  . Degenerative joint disease   . Diabetes mellitus without complication ([place])   . Gastroesophageal reflux   . Pancreatitis   . Panic attack   . Sleep apnea   . Thrombocytopenia [facility])     Patient Active Problem List   Diagnosis Date Noted  . Anemia   . Coagulopathy ([place])   . Ascites 03/30/2020  . Abnormal weight gain 03/30/2020  . Severe recurrent major depressive disorder with psychotic features ([place]) 10/05/2019  . Major depressive disorder, single episode, severe with psychotic features ([place]) 10/04/2019  . Abnormal head CT 04/02/2019  . Multiple falls 04/02/2019  . Stroke-like symptoms 04/02/2019  . Panic attack 03/30/2019  . History of COPD 03/20/2019  . Chronic abdominal pain 02/05/2019  . DKA (diabetic ketoacidoses) 01/26/2019  . Sleep apnea   . Gastroesophageal reflux   . Hepatitis A 01/13/2019  . 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[place]) 01/12/2019  . COVID-19 01/12/2019  . Glycosuria 01/12/2019  . Hyperbilirubinemia 01/12/2019  .  Hypoalbuminemia 01/12/2019  . Normocytic anemia, not due to blood loss 01/12/2019  . Uncontrolled type 2 diabetes mellitus with hyperglycemia, with long-term current use of insulin ([place]) 01/12/2019  . Low TSH level 01/12/2019  . Chest pain 12/16/2018  . Left-sided weakness 12/16/2018  . Weight loss 12/16/2018  . Schizoaffective disorder ([place]) 03/10/2018  . Essential hypertension 03/09/2018  . H/O partial thyroidectomy 10/25/2017  . Thyroid nodule 10/25/2017  . Cricopharyngeal hypertrophy 09/26/2017  . Pharyngoesophageal dysphagia 09/26/2017  . Reaction to severe stress 09/11/2017  . Left hemiparesis ([place]) 04/18/2017  . Disorder of shoulder 03/03/2017  . Lactic acidosis 09/27/2016  . Pancytopenia ([place]) 09/27/2016  . Acquired hypothyroidism 09/27/2016  . Microalbuminuria due to type 2 diabetes mellitus ([place]) 04/20/2016  . Diabetic peripheral neuropathy associated with type 2 diabetes mellitus ([place]) 12/03/2015  . Hepatic steatosis 12/03/2015  . Diabetes mellitus without complication ([place]) 76/16/0737  . Moderate episode of recurrent major depressive disorder ([place]) 03/20/2015  . Complicated UTI (urinary tract infection) 12/25/2014  . Vaginitis and vulvovaginitis 11/06/2014  . Blurry vision, bilateral 10/17/2014  . Health care maintenance 10/17/2014  . Nausea 10/17/2014  . Multinodular thyroid 07/01/2014  . Cirrhosis of liver ([place]) 03/05/2014  . Postoperative hypothyroidism 03/05/2014  . Type 2 diabetes mellitus ([place]) 03/05/2014  . Thrombocytopenia ([place]) 01/17/2014  . Alcoholic cirrhosis of liver without ascites ([place]) 01/17/2014  . Back problem 05/28/2013  . Low back pain 04/12/2013  . Borderline personality disorder ([place]) 08/25/2012  . Depression 08/25/2012  . Hypercholesterolemia 08/25/2012  .  Osteoarthrosis 08/25/2012  . Gastro-esophageal reflux disease without esophagitis 08/25/2012  . Obstructive sleep apnea 08/25/2012    Past Surgical History:  Procedure Laterality Date   . CHOLECYSTECTOMY    . SHOULDER SURGERY     left   . tongue sx. titanium screw placed to hold tongue down    . TOTAL THYROIDECTOMY       OB History   No obstetric history on file.     No family history on file.  Social History   Tobacco Use  . Smoking status: Never Smoker  . Smokeless tobacco: Never Used  Vaping Use  . Vaping Use: Never used  Substance Use Topics  . Alcohol use: No  . Drug use: No    Home Medications Prior to Admission medications   Medication Sig Start Date End Date Taking? Authorizing Provider  albuterol (PROVENTIL) (2.5 MG/3ML) 0.083% nebulizer solution Inhale 2.5 mg into the lungs every 6 (six) hours as needed for wheezing or shortness of breath.  04/19/19   [provider]  albuterol (VENTOLIN HFA) 108 (90 Base) MCG/ACT inhaler Inhale 2 puffs into the lungs every 4 (four) hours as needed for wheezing or shortness of breath. 04/02/20   Roxan Hockey, MD  ARIPiprazole (ABILIFY) 5 MG tablet Take 1 tablet (5 mg total) by mouth daily. For mood control/adjunct to antidepressant 04/02/20   Roxan Hockey, MD  FLUoxetine (PROZAC) 20 MG capsule Take 1 capsule (20 mg total) by mouth daily. For depression Patient not taking: No sig reported 10/11/19   Lindell Spar I, NP  furosemide (LASIX) 20 MG tablet Take 20 mg by mouth in the morning and at bedtime.    [provider]  gabapentin (NEURONTIN) 100 MG capsule Take 2 capsules (200 mg total) by mouth 3 (three) times daily. For agitation/neuropathy 04/02/20   Roxan Hockey, MD  hydrOXYzine (ATARAX/VISTARIL) 25 MG tablet Take 1 tablet (25 mg total) by mouth 3 (three) times daily as needed for anxiety. Patient not taking: Reported on 03/30/2020 10/10/19   Lindell Spar I, NP  insulin aspart (NOVOLOG) 100 UNIT/ML injection Inject 10 Units into the skin 3 (three) times daily with meals. For diabetes management Patient taking differently: Inject into the skin. Via Insulin Pump 10/10/19   Lindell Spar I, NP   Insulin Human (INSULIN PUMP) SOLN Inject into the skin. Novolog    [provider]  lactulose (CHRONULAC) 10 GM/15ML solution Take 45 mLs (30 g total) by mouth 3 (three) times daily. 04/02/20   Roxan Hockey, MD  levothyroxine (SYNTHROID, LEVOTHROID) 50 MCG tablet Take 50 mcg by mouth daily before breakfast.  07/25/14   [provider]  lipase/protease/amylase (CREON) 12000-38000 units CPEP capsule Take 1 capsule (12,000 Units total) by mouth 3 (three) times daily before meals. 04/02/20   Roxan Hockey, MD  megestrol (MEGACE) 40 MG tablet Take 1 tablet (40 mg total) by mouth daily. For Cachexia Patient not taking: No sig reported 10/11/19   Lindell Spar I, NP  mirtazapine (REMERON) 15 MG tablet Take 1 tablet (15 mg total) by mouth at bedtime. For depression Patient not taking: No sig reported 10/10/19   Lindell Spar I, NP  ondansetron (ZOFRAN ODT) 4 MG disintegrating tablet 50m ODT q4 hours prn nausea/vomit 04/28/20   ZMilton Ferguson MD  ondansetron (ZOFRAN ODT) 4 MG disintegrating tablet Take 1 tablet (4 mg total) by mouth every 8 (eight) hours as needed. 05/09/20   WRipley Fraise MD  Oxycodone HCl 10 MG TABS Take 10 mg by  mouth every 6 (six) hours as needed for severe pain. 03/06/20   [provider]  potassium chloride SA (KLOR-CON) 20 MEQ tablet Take 1 tablet (20 mEq total) by mouth 2 (two) times daily. 04/02/20   Roxan Hockey, MD  lisinopril (ZESTRIL) 10 MG tablet Take 10 mg by mouth daily. Patient not taking: Reported on 10/02/2019 03/29/19 10/02/19  [provider]  metFORMIN (GLUCOPHAGE) 1000 MG tablet Take 1,000 mg by mouth 2 (two) times daily with a meal.  Patient not taking: Reported on 10/02/2019  10/02/19  [provider]  metoCLOPramide (REGLAN) 10 MG tablet Take 10 mg by mouth 4 (four) times daily.  Patient not taking: Reported on 10/02/2019  10/02/19  [provider]  sertraline (ZOLOFT) 100 MG tablet Take 150 mg by mouth  daily. Patient not taking: Reported on 10/02/2019 09/10/16 10/02/19  [provider]  spironolactone (ALDACTONE) 50 MG tablet Take 50 mg by mouth daily. Patient not taking: Reported on 10/02/2019 03/27/19 10/02/19  [provider]  traZODone (DESYREL) 100 MG tablet Take 1 tablet by mouth daily. Patient not taking: Reported on 10/02/2019 09/15/16 10/02/19  [provider]    Allergies    Acetaminophen, Cephalexin, Ciprofloxacin, Hydromorphone, and Sulfamethoxazole-trimethoprim  Review of Systems   Review of Systems  Constitutional: Negative for chills and fever.  Respiratory: Negative for shortness of breath.   Cardiovascular: Negative for chest pain.  Gastrointestinal: Positive for abdominal pain, nausea and vomiting. Negative for constipation and diarrhea.  Genitourinary: Negative for difficulty urinating.  Musculoskeletal: Negative for arthralgias and myalgias.  Skin: Negative for wound.  Allergic/Immunologic: Positive for immunocompromised state.  Neurological: Negative for dizziness, weakness and headaches.  Hematological: Bruises/bleeds easily.  Psychiatric/Behavioral: Positive for confusion.  All other systems reviewed and are negative.   Physical Exam Updated Vital Signs BP (!) 132/55   Pulse 83   Temp 97.6 F (36.4 C) (Oral)   Resp 20   Ht 5' 3"  (1.6 m)   Wt 53.6 kg   SpO2 93%   BMI 20.94 kg/m   Physical Exam Vitals and nursing note reviewed.  Constitutional:      General: She is not in acute distress.    Appearance: She is well-developed and well-nourished. She is not diaphoretic.  HENT:     Head: Normocephalic and atraumatic.     Mouth/Throat:     Mouth: Mucous membranes are moist.  Eyes:     General: Scleral icterus present.     Extraocular Movements: Extraocular movements intact.     Pupils: Pupils are equal, round, and reactive to light.  Cardiovascular:     Rate and Rhythm: Normal rate and regular rhythm.     Pulses: Normal pulses.      Heart sounds: Normal heart sounds.  Pulmonary:     Effort: Pulmonary effort is normal.     Breath sounds: Normal breath sounds.  Abdominal:     Palpations: Abdomen is soft.     Tenderness: There is generalized abdominal tenderness.  Musculoskeletal:     Cervical back: Neck supple. No tenderness.  Skin:    General: Skin is warm and dry.     Findings: No erythema or rash.  Neurological:     Mental Status: She is alert and oriented to person, place, and time.     Sensory: No sensory deficit.     Motor: No weakness.  Psychiatric:        Mood and Affect: Mood and affect normal.  Behavior: Behavior normal.     ED Results / Procedures / Treatments   Labs (all labs ordered are listed, but only abnormal results are displayed) Labs Reviewed  CBC WITH DIFFERENTIAL/PLATELET - Abnormal; Notable for the following components:      Result Value   WBC 3.3 (*)    RBC 2.67 (*)    Hemoglobin 9.1 (*)    HCT 27.7 (*)    MCV 103.7 (*)    MCH 34.1 (*)    Platelets 53 (*)    All other components within normal limits  COMPREHENSIVE METABOLIC PANEL - Abnormal; Notable for the following components:   Sodium 133 (*)    Glucose, Bld 181 (*)    Calcium 8.6 (*)    Total Protein 6.0 (*)    Albumin 2.2 (*)    AST 48 (*)    Total Bilirubin 3.9 (*)    Anion gap 4 (*)    All other components within normal limits  PROTIME-INR - Abnormal; Notable for the following components:   Prothrombin Time 20.4 (*)    INR 1.8 (*)    All other components within normal limits  AMMONIA  LIPASE, BLOOD    EKG None  Radiology No results found.  Procedures Procedures   Medications Ordered in ED Medications  sodium chloride 0.9 % bolus 500 mL (has no administration in time range)    ED Course  I have reviewed the triage vital signs and the nursing notes.  Pertinent labs & imaging results that were available during my care of the patient were reviewed by me and considered in my medical decision  making (see chart for details).  Clinical Course as of 05/21/20 2037  Thu May 22, 5326  [id number] 55 year old with history as above, found to have mild generalized abdominal tenderness.  Patient gives a fairly good history for someone complaining of confusion, states that she is having difficulty operating her TV remote, she is operating herself and at bedside without obvious difficulty.  Due to her history with concern for follow-up, will plan to check labs and abdominal CT.  Labs reviewed and compared to prior, CBC with anemia with hemoglobin of 9.1, no significant change, platelets are 53.  CMP with glucose of 181, bicarb is 26, gap of 4.  Will give patient IV fluids.  INR is 1.8.  Ammonia and lipase are pending as well as CT.  Care signed out at end of shift pending remaining results. [LM]    Clinical Course User Index [LM] Roque Lias   MDM Rules/Calculators/A&P                          Final Clinical Impression(s) / ED Diagnoses Final diagnoses:  None    Rx / DC Orders ED Discharge Orders    None       Roque Lias 05/21/20 2037    Milton Ferguson, MD 05/21/20 2228

## 2020-05-21 NOTE — H&P (Signed)
TRH H&P    Patient Demographics:    Joan RicherDonna Lindsey, is a 55 y.o. female  MRN: [id number]  DOB - May 16, 1965  Admit Date - 05/21/2020  Referring MD/NP/PA: Trisha Mangleriplett  Outpatient Primary MD for the patient is [facility]  Patient coming from: Home  Chief complaint- confusion   HPI:    Joan RicherDonna Lindsey  is a 55 y.o. female, with medical history significant for end-stage liver disease, alcoholic cirrhosis, pancreatitis, GERD, diabetes mellitus, who is no longer in outpatient hospice per her report, presents to the ED with a chief complaint of confusion.  Patient reports that she felt like her ammonia was high.  She describes her confusion is not doing what she was doing and forgetting how to do things she normally is able to do.  Patient reports that this started 5 days ago on Sunday.  She reports that she had a fall 3 days ago on Tuesday, and since that time she has had abdominal pain and nausea.  Patient reports that she is throwing up at least twice a day.  She reports that she is compliant with her lactulose but likely throwing it up.  Her emesis appears to be undigested food.  No hematemesis.  Patient had last bowel movement yesterday.  No bowel movements today despite lactulose 3 times daily.  Patient reports her abdominal pain is on the left side and feels like a soreness.  She also reports she was recently treated for UTI with Macrobid.    Her fall was described as a trip and fall, involving a dog in the yard.  She had no preceding symptoms.  No loss of consciousness.  She describes her landing as face first, landing on her forearms, then her whole abdomen.  Patient does not smoke.  She does not drink, and she claims that she was never a heavy drinker despite her diagnosis of alcoholic cirrhosis.  She reports that she started attempting to commit suicide at a young age by overdosing on Tylenol, not  continued until she was 55 years old.  She believes that is why she has cirrhosis.  Patient is not suicidal at this time.  She leans on her face for her coping mechanism.  Patient does not use illicit drugs.  Patient is DNR.  [name] is no longer in hospice, because she "did not like the idea of laying here until I die."  In the ED Temp 97.6, heart rate 83, respiratory rate 13, blood pressure 140/65, satting at 95% Leukopenia at 3.3-baseline seems to be between 3 and 4 Anemia with a hemoglobin of 9.1-baseline is around 9 Ammonia 140-this is the highest reading we have in our system for her INR 1.8, albumin 2.2 CT abdomen pelvis showed cirrhosis with portal hypertension.  Small amount of pelvic free fluid decreased in the severity when compared to prior study. Chest x-ray showed stable cardiomegaly without acute disease EKG shows a heart rate of 74, sinus rhythm, QTC 458 Lactulose 30 g p.o. given in the ED Normal saline 500 bolus Of note patient  had no episodes of emesis while in the ED    Review of systems:    In addition to the HPI above,  No Fever-chills, No Headache, No changes with Vision or hearing, No problems swallowing food or Liquids, No Chest pain, Cough or Shortness of Breath, Admits to nausea and vomiting, bowel movements are regular, No Blood in stool or Urine, No dysuria, No new skin rashes or bruises, No new joints pains-aches,  No new weakness, tingling, numbness in any extremity, admits chronic weakness on left side No recent weight gain or loss, No polyuria, polydypsia or polyphagia,   All other systems reviewed and are negative.    Past History of the following :    Past Medical History:  Diagnosis Date  . Degenerative joint disease   . Diabetes mellitus without complication (HCC)   . Gastroesophageal reflux   . Pancreatitis   . Panic attack   . Sleep apnea   . Thrombocytopenia (HCC)       Past Surgical History:  Procedure Laterality Date   . CHOLECYSTECTOMY    . SHOULDER SURGERY     left   . tongue sx. titanium screw placed to hold tongue down    . TOTAL THYROIDECTOMY        Social History:      Social History   Tobacco Use  . Smoking status: Never Smoker  . Smokeless tobacco: Never Used  Substance Use Topics  . Alcohol use: No       Family History :    No family history on file. Family history of hypertension   Home Medications:   Prior to Admission medications   Medication Sig Start Date End Date Taking? Authorizing Provider  albuterol (PROVENTIL) (2.5 MG/3ML) 0.083% nebulizer solution Inhale 2.5 mg into the lungs every 6 (six) hours as needed for wheezing or shortness of breath.  04/19/19   [provider]  albuterol (VENTOLIN HFA) 108 (90 Base) MCG/ACT inhaler Inhale 2 puffs into the lungs every 4 (four) hours as needed for wheezing or shortness of breath. 04/02/20   Shon Hale, MD  ARIPiprazole (ABILIFY) 5 MG tablet Take 1 tablet (5 mg total) by mouth daily. For mood control/adjunct to antidepressant 04/02/20   Shon Hale, MD  FLUoxetine (PROZAC) 20 MG capsule Take 1 capsule (20 mg total) by mouth daily. For depression Patient not taking: No sig reported 10/11/19   Armandina Stammer I, NP  furosemide (LASIX) 20 MG tablet Take 20 mg by mouth in the morning and at bedtime.    [provider]  gabapentin (NEURONTIN) 100 MG capsule Take 2 capsules (200 mg total) by mouth 3 (three) times daily. For agitation/neuropathy 04/02/20   Shon Hale, MD  hydrOXYzine (ATARAX/VISTARIL) 25 MG tablet Take 1 tablet (25 mg total) by mouth 3 (three) times daily as needed for anxiety. Patient not taking: Reported on 03/30/2020 10/10/19   Armandina Stammer I, NP  insulin aspart (NOVOLOG) 100 UNIT/ML injection Inject 10 Units into the skin 3 (three) times daily with meals. For diabetes management Patient taking differently: Inject into the skin. Via Insulin Pump 10/10/19   Armandina Stammer I, NP  Insulin Human  (INSULIN PUMP) SOLN Inject into the skin. Novolog    [provider]  lactulose (CHRONULAC) 10 GM/15ML solution Take 45 mLs (30 g total) by mouth 3 (three) times daily. 04/02/20   Shon Hale, MD  levothyroxine (SYNTHROID, LEVOTHROID) 50 MCG tablet Take 50 mcg by mouth daily before breakfast.  07/25/14  [provider]  lipase/protease/amylase (CREON) 12000-38000 units CPEP capsule Take 1 capsule (12,000 Units total) by mouth 3 (three) times daily before meals. 04/02/20   Shon Hale, MD  megestrol (MEGACE) 40 MG tablet Take 1 tablet (40 mg total) by mouth daily. For Cachexia Patient not taking: No sig reported 10/11/19   Armandina Stammer I, NP  mirtazapine (REMERON) 15 MG tablet Take 1 tablet (15 mg total) by mouth at bedtime. For depression Patient not taking: No sig reported 10/10/19   Armandina Stammer I, NP  ondansetron (ZOFRAN ODT) 4 MG disintegrating tablet 4mg  ODT q4 hours prn nausea/vomit 04/28/20   06/26/20, MD  ondansetron (ZOFRAN ODT) 4 MG disintegrating tablet Take 1 tablet (4 mg total) by mouth every 8 (eight) hours as needed. 05/09/20   05/11/20, MD  Oxycodone HCl 10 MG TABS Take 10 mg by mouth every 6 (six) hours as needed for severe pain. 03/06/20   [provider]  potassium chloride SA (KLOR-CON) 20 MEQ tablet Take 1 tablet (20 mEq total) by mouth 2 (two) times daily. 04/02/20   04/04/20, MD  lisinopril (ZESTRIL) 10 MG tablet Take 10 mg by mouth daily. Patient not taking: Reported on 10/02/2019 03/29/19 10/02/19  [provider]  metFORMIN (GLUCOPHAGE) 1000 MG tablet Take 1,000 mg by mouth 2 (two) times daily with a meal.  Patient not taking: Reported on 10/02/2019  10/02/19  [provider]  metoCLOPramide (REGLAN) 10 MG tablet Take 10 mg by mouth 4 (four) times daily.  Patient not taking: Reported on 10/02/2019  10/02/19  [provider]  sertraline (ZOLOFT) 100 MG tablet Take 150 mg by mouth daily. Patient not  taking: Reported on 10/02/2019 09/10/16 10/02/19  [provider]  spironolactone (ALDACTONE) 50 MG tablet Take 50 mg by mouth daily. Patient not taking: Reported on 10/02/2019 03/27/19 10/02/19  [provider]  traZODone (DESYREL) 100 MG tablet Take 1 tablet by mouth daily. Patient not taking: Reported on 10/02/2019 09/15/16 10/02/19  [provider]     Allergies:     Allergies  Allergen Reactions  . Acetaminophen Hives and Other (See Comments)    Tolerates Fioricet  . Cephalexin Itching, Swelling and Other (See Comments)    Tongue swells and makes throat itchy- can tolerate Zosyn  . Ciprofloxacin Hives  . Hydromorphone Hives  . Sulfamethoxazole-Trimethoprim Shortness Of Breath, Swelling and Other (See Comments)    Tongue swells      Physical Exam:   Vitals  Blood pressure 140/65, pulse 88, temperature 97.6 F (36.4 C), temperature source Oral, resp. rate 14, height 5\' 3"  (1.6 m), weight 53.6 kg, SpO2 95 %.  1.  General: Patient lying left lateral decubitus in bed, chronically ill-appearing, no acute distress  2. Psychiatric: Patient is alert to self and place, she is confused about time, guessing at the ER and guessing at the month.  Patient is tearful, cooperative with exam  3. Neurologic: Face is symmetric, speech and language are normal, chronic left-sided weakness, equal sensation  4. HEENMT:  Head is atraumatic, normocephalic, pupils reactive to light, neck is cachectic, trachea is midline, mucous membranes are moist  5. Respiratory : Diminished breath sounds in the lower lung field bilaterally, no wheezing, no crackles, no rhonchi, no cyanosis, maintaining oxygen saturations on room air  6. Cardiovascular : Heart rate is normal, rhythm is regular, loud systolic murmur, no rubs or gallops, no peripheral edema  7. Gastrointestinal:  Abdomen is soft, delayed guarding with palpation  of left upper and lower quadrant as well as right lower  quadrant, no masses palpated, bowel sounds active  8. Skin:  Skin is warm dry and intact  9.Musculoskeletal:  No peripheral edema, no calf tenderness, no acute deformity    Data Review:    CBC Recent Labs  Lab 05/21/20 1939  WBC 3.3*  HGB 9.1*  HCT 27.7*  PLT 53*  MCV 103.7*  MCH 34.1*  MCHC 32.9  RDW 15.5  LYMPHSABS 0.8  MONOABS 0.4  EOSABS 0.1  BASOSABS 0.0   ------------------------------------------------------------------------------------------------------------------  Results for orders placed or performed during the hospital encounter of 05/21/20 (from the past 48 hour(s))  CBC with Differential     Status: Abnormal   Collection Time: 05/21/20  7:39 PM  Result Value Ref Range   WBC 3.3 (L) 4.0 - 10.5 K/uL   RBC 2.67 (L) 3.87 - 5.11 MIL/uL   Hemoglobin 9.1 (L) 12.0 - 15.0 g/dL   HCT 86.3 (L) 81.7 - 71.1 %   MCV 103.7 (H) 80.0 - 100.0 fL   MCH 34.1 (H) 26.0 - 34.0 pg   MCHC 32.9 30.0 - 36.0 g/dL   RDW 65.7 90.3 - 83.3 %   Platelets 53 (L) 150 - 400 K/uL    Comment: SPECIMEN CHECKED FOR CLOTS Immature Platelet Fraction may be clinically indicated, consider ordering this additional test [id number] PLATELET COUNT CONFIRMED BY SMEAR    nRBC 0.0 0.0 - 0.2 %   Neutrophils Relative % 61 %   Neutro Abs 2.0 1.7 - 7.7 K/uL   Lymphocytes Relative 23 %   Lymphs Abs 0.8 0.7 - 4.0 K/uL   Monocytes Relative 11 %   Monocytes Absolute 0.4 0.1 - 1.0 K/uL   Eosinophils Relative 4 %   Eosinophils Absolute 0.1 0.0 - 0.5 K/uL   Basophils Relative 1 %   Basophils Absolute 0.0 0.0 - 0.1 K/uL   Immature Granulocytes 0 %   Abs Immature Granulocytes 0.01 0.00 - 0.07 K/uL    Comment: Performed at [facility], [address] [place], [place] [address]  Comprehensive metabolic panel     Status: Abnormal   Collection Time: 05/21/20  7:39 PM  Result Value Ref Range   Sodium 133 (L) 135 - 145 mmol/L   Potassium 4.0 3.5 - 5.1 mmol/L   Chloride 103 98 - 111 mmol/L   CO2 26 22  - 32 mmol/L   Glucose, Bld 181 (H) 70 - 99 mg/dL    Comment: Glucose reference range applies only to samples taken after fasting for at least 8 hours.   BUN 13 6 - 20 mg/dL   Creatinine, Ser 0.45 0.44 - 1.00 mg/dL   Calcium 8.6 (L) 8.9 - 10.3 mg/dL   Total Protein 6.0 (L) 6.5 - 8.1 g/dL   Albumin 2.2 (L) 3.5 - 5.0 g/dL   AST 48 (H) 15 - 41 U/L   ALT 20 0 - 44 U/L   Alkaline Phosphatase 91 38 - 126 U/L   Total Bilirubin 3.9 (H) 0.3 - 1.2 mg/dL   GFR, Estimated >99 >77 mL/min    Comment: (NOTE) Calculated using the CKD-EPI Creatinine Equation (2021)    Anion gap 4 (L) 5 - 15    Comment: Performed at [facility], [address], [place], [place] [address]  Protime-INR     Status: Abnormal   Collection Time: 05/21/20  7:39 PM  Result Value Ref Range   Prothrombin Time 20.4 (H) 11.4 - 15.2 seconds  INR 1.8 (H) 0.8 - 1.2    Comment: (NOTE) INR goal varies based on device and disease states. Performed at [facility], [address], [place], [place] [address]   Ammonia     Status: Abnormal   Collection Time: 05/21/20  7:39 PM  Result Value Ref Range   Ammonia 140 (H) 9 - 35 umol/L    Comment: Performed at [facility], [address], [place], [place] [address]  Lipase, blood     Status: None   Collection Time: 05/21/20  7:39 PM  Result Value Ref Range   Lipase 38 11 - 51 U/L    Comment: Performed at [facility], [address] [place], [place] [address]    Chemistries  Recent Labs  Lab 05/21/20 1939  NA 133*  K 4.0  CL 103  CO2 26  GLUCOSE 181*  BUN 13  CREATININE 0.45  CALCIUM 8.6*  AST 48*  ALT 20  ALKPHOS 91  BILITOT 3.9*   ------------------------------------------------------------------------------------------------------------------  ------------------------------------------------------------------------------------------------------------------ GFR: Estimated Creatinine Clearance: 64.2 mL/min (by C-G formula based on SCr of 0.45 mg/dL). Liver Function  Tests: Recent Labs  Lab 05/21/20 1939  AST 48*  ALT 20  ALKPHOS 91  BILITOT 3.9*  PROT 6.0*  ALBUMIN 2.2*   Recent Labs  Lab 05/21/20 1939  LIPASE 38   Recent Labs  Lab 05/21/20 1939  AMMONIA 140*   Coagulation Profile: Recent Labs  Lab 05/21/20 1939  INR 1.8*   Cardiac Enzymes: No results for input(s): CKTOTAL, CKMB, CKMBINDEX, TROPONINI in the last 168 hours. BNP (last 3 results) No results for input(s): PROBNP in the last 8760 hours. HbA1C: No results for input(s): HGBA1C in the last 72 hours. CBG: No results for input(s): GLUCAP in the last 168 hours. Lipid Profile: No results for input(s): CHOL, HDL, LDLCALC, TRIG, CHOLHDL, LDLDIRECT in the last 72 hours. Thyroid Function Tests: No results for input(s): TSH, T4TOTAL, FREET4, T3FREE, THYROIDAB in the last 72 hours. Anemia Panel: No results for input(s): VITAMINB12, FOLATE, FERRITIN, TIBC, IRON, RETICCTPCT in the last 72 hours.  --------------------------------------------------------------------------------------------------------------- Urine analysis:    Component Value Date/Time   COLORURINE YELLOW 04/28/2020 1223   APPEARANCEUR CLEAR 04/28/2020 1223   LABSPEC 1.020 04/28/2020 1223   PHURINE 8.0 04/28/2020 1223   GLUCOSEU NEGATIVE 04/28/2020 1223   HGBUR MODERATE (A) 04/28/2020 1223   BILIRUBINUR NEGATIVE 04/28/2020 1223   KETONESUR NEGATIVE 04/28/2020 1223   PROTEINUR NEGATIVE 04/28/2020 1223   UROBILINOGEN 0.2 04/13/2007 1545   NITRITE NEGATIVE 04/28/2020 1223   LEUKOCYTESUR NEGATIVE 04/28/2020 1223      Imaging Results:    CT Abdomen Pelvis W Contrast  Result Date: 05/21/2020 CLINICAL DATA:  Abdominal pain status post fall. EXAM: CT ABDOMEN AND PELVIS WITH CONTRAST TECHNIQUE: Multidetector CT imaging of the abdomen and pelvis was performed using the standard protocol following bolus administration of intravenous contrast. CONTRAST:  OMNIPAQUE IOHEXOL 300 MG/ML  SOLN COMPARISON:  April 28, 2020 FINDINGS: Lower chest: Mild atelectasis is seen within the posterior aspect of the bilateral lung bases. Hepatobiliary: The liver is cirrhotic in appearance. An 8 mm diameter cyst is seen within the posterolateral aspect of the right lobe of the liver. Status post cholecystectomy. The common bile duct is dilated and measures approximately 1.5 cm. Pancreas: Unremarkable. No pancreatic ductal dilatation or surrounding inflammatory changes. Spleen: There is moderate to marked severity splenomegaly. Adrenals/Urinary Tract: Adrenal glands are unremarkable. Kidneys are normal in size, without renal calculi or hydronephrosis. Stable subcentimeter  cysts are seen within the right kidney. Bladder is unremarkable. Stomach/Bowel: Stomach is within normal limits. The appendix is not clearly identified. No evidence of bowel wall thickening, distention, or inflammatory changes. Vascular/Lymphatic: Aortic atherosclerosis. Numerous dilated varices are seen within the upper abdomen. No enlarged abdominal or pelvic lymph nodes. Reproductive: The uterus is positioned to the right of midline and is otherwise normal in appearance. The bilateral adnexa are unremarkable. Other: No abdominal wall hernia or abnormality. A small amount of pelvic free fluid is seen. This is decreased in severity when compared to the prior study. Musculoskeletal: No acute or significant osseous findings. IMPRESSION: 1.   Cirrhosis with evidence of portal hypertension. 2. Small amount of pelvic free fluid, decreased in severity when compared to the prior study. 3.   Evidence of prior cholecystectomy. 4. Aortic atherosclerosis. Aortic Atherosclerosis (ICD10-I70.0). Electronically Signed   By: Aram Candela M.D.   On: 05/21/2020 21:18   DG Chest Port 1 View  Result Date: 05/21/2020 CLINICAL DATA:  Confusion, generalized abdominal pain and nausea. EXAM: PORTABLE CHEST 1 VIEW COMPARISON:  February 28, 2020 FINDINGS: The lungs are hyperinflated. Mild,  diffuse, chronic appearing increased lung markings are seen. There is no evidence of acute infiltrate, pleural effusion or pneumothorax. There is mild to moderate severity enlargement of the cardiac silhouette. The visualized skeletal structures are unremarkable. IMPRESSION: Stable cardiomegaly without acute cardiopulmonary disease. Electronically Signed   By: Aram Candela M.D.   On: 05/21/2020 21:22    My personal review of EKG: Rhythm NSR, Rate 74 /min, QTc 458 ,no Acute ST changes   Assessment & Plan:    Principal Problem:   Hepatic encephalopathy (HCC) Active Problems:   Diabetes mellitus without complication (HCC)   Alcoholic cirrhosis of liver without ascites (HCC)   Hypoalbuminemia   Schizoaffective disorder (HCC)   1. Hepatic encephalopathy 1. Ammonia 140 2. 30g lactulose given in ED 3. Continue 30g Lactulose q8 H 4. Trend ammonia in the AM  2. End stage liver disease  1.  Patient is nearing end stages of her liver disease.  2. Continue lasix and lactulose 3. Palliative care consulted on last admission -patient is DNR 3. Brittle type 2 diabetes mellitus 1. Hold insulin pump 2. Lantus and novolog while in the hospital 4. Schizoaffective disorder 1. patient no longer taking any of her psychiatric medications as she believed they were not helping  2. We discussed patient's depression, her coping mechanisms, and the fact that psych meds can and will help, patient declines to take them in this hospitalization 5. hypoalbuminemia 1. 2/2 Liver disease and poor nutrition 2. Advised on the importance of nutrient dense food choices 3. Continue to monitor 6. Acquired thyroid disease 1. Continue synthroid    DVT Prophylaxis-Heparin- SCDs  AM Labs Ordered, also please review Full Orders  Family Communication: No family at bedside Code Status: DNR  Admission status: Observation Time spent in minutes : 65   Asia B Zierle-Ghosh DO

## 2020-05-21 NOTE — ED Provider Notes (Signed)
  Patient signed out to me by Suella Broad, PA-C pending completion of work-up.  Patient here with known history of hepatic encephalopathy, diabetes, alcoholic cirrhosis.  Here for evaluation of increasing confusion for 4 days.  Also states that she fell Tuesday landing on her stomach.  She reports increasing abdominal pain since the fall.  Abdominal pain has been associated with nausea and decreased appetite.  On my exam, patient has diffusely tender abdomen.  No appreciable ascites. No active vomiting.  Appears to be mentating well.  Labs show an ammonia of 140, Hemoglobin 9.1, near baseline.  Bilirubin 3.9 also appears baseline INR 1.8 PT 20.4  Lactulose ordered, CT of the abdomen negative for acute findings.  Patient will need admission for hepatic encephalopathy.  Discussed findings with [facility] hospitalist, Dr. Clearence Ped who agrees to admit.    Kem Parkinson, PA-C 05/21/20 2304    Milton Ferguson, MD 05/22/20 [phone]

## 2020-05-22 DIAGNOSIS — F250 Schizoaffective disorder, bipolar type: Secondary | ICD-10-CM

## 2020-05-22 DIAGNOSIS — E1140 Type 2 diabetes mellitus with diabetic neuropathy, unspecified: Secondary | ICD-10-CM | POA: Diagnosis not present

## 2020-05-22 DIAGNOSIS — R5381 Other malaise: Secondary | ICD-10-CM

## 2020-05-22 DIAGNOSIS — Z794 Long term (current) use of insulin: Secondary | ICD-10-CM

## 2020-05-22 DIAGNOSIS — K7210 Chronic hepatic failure without coma: Secondary | ICD-10-CM

## 2020-05-22 DIAGNOSIS — K7291 Hepatic failure, unspecified with coma: Secondary | ICD-10-CM | POA: Diagnosis not present

## 2020-05-22 DIAGNOSIS — K7290 Hepatic failure, unspecified without coma: Secondary | ICD-10-CM | POA: Diagnosis not present

## 2020-05-22 LAB — COMPREHENSIVE METABOLIC PANEL
ALT: 19 U/L (ref 0–44)
AST: 46 U/L — ABNORMAL HIGH (ref 15–41)
Albumin: 2.1 g/dL — ABNORMAL LOW (ref 3.5–5.0)
Alkaline Phosphatase: 77 U/L (ref 38–126)
Anion gap: 7 (ref 5–15)
BUN: 12 mg/dL (ref 6–20)
CO2: 23 mmol/L (ref 22–32)
Calcium: 8.4 mg/dL — ABNORMAL LOW (ref 8.9–10.3)
Chloride: 109 mmol/L (ref 98–111)
Creatinine, Ser: 0.41 mg/dL — ABNORMAL LOW (ref 0.44–1.00)
GFR, Estimated: >60 mL/min (ref >60)
Glucose, Bld: 100 mg/dL — ABNORMAL HIGH (ref 70–99)
Potassium: 3.7 mmol/L (ref 3.5–5.1)
Sodium: 139 mmol/L (ref 135–145)
Total Bilirubin: 4.1 mg/dL — ABNORMAL HIGH (ref 0.3–1.2)
Total Protein: 5.2 g/dL — ABNORMAL LOW (ref 6.5–8.1)

## 2020-05-22 LAB — GLUCOSE, CAPILLARY
Glucose-Capillary: 134 mg/dL — ABNORMAL HIGH (ref 70–99)
Glucose-Capillary: 134 mg/dL — ABNORMAL HIGH (ref 70–99)
Glucose-Capillary: 202 mg/dL — ABNORMAL HIGH (ref 70–99)
Glucose-Capillary: 243 mg/dL — ABNORMAL HIGH (ref 70–99)

## 2020-05-22 LAB — CBC
HCT: 26.3 % — ABNORMAL LOW (ref 36.0–46.0)
Hemoglobin: 8.5 g/dL — ABNORMAL LOW (ref 12.0–15.0)
MCH: 33.6 pg (ref 26.0–34.0)
MCHC: 32.3 g/dL (ref 30.0–36.0)
MCV: 104 fL — ABNORMAL HIGH (ref 80.0–100.0)
Platelets: 44 10*3/uL — ABNORMAL LOW (ref 150–400)
RBC: 2.53 MIL/uL — ABNORMAL LOW (ref 3.87–5.11)
RDW: 15.3 % (ref 11.5–15.5)
WBC: 2.8 10*3/uL — ABNORMAL LOW (ref 4.0–10.5)
nRBC: 0 % (ref 0.0–0.2)

## 2020-05-22 LAB — AMMONIA: Ammonia: 73 umol/L — ABNORMAL HIGH (ref 9–35)

## 2020-05-22 LAB — SARS CORONAVIRUS 2 (TAT 6-24 HRS): SARS Coronavirus 2: NEGATIVE

## 2020-05-22 MED ORDER — INSULIN ASPART 100 UNIT/ML ~~LOC~~ SOLN
0.0000 [IU] | Freq: Three times a day (TID) | SUBCUTANEOUS | Status: DC
  Administered 2020-05-22 (×2): 3 [IU] via SUBCUTANEOUS
  Administered 2020-05-22: 1 [IU] via SUBCUTANEOUS

## 2020-05-22 MED ORDER — INSULIN DETEMIR 100 UNIT/ML ~~LOC~~ SOLN
6.0000 [IU] | Freq: Every day | SUBCUTANEOUS | Status: DC
  Administered 2020-05-22: 6 [IU] via SUBCUTANEOUS
  Filled 2020-05-22 (×4): qty 0.06

## 2020-05-22 MED ORDER — GABAPENTIN 100 MG PO CAPS: 200.0000 mg | ORAL_CAPSULE | Freq: Two times a day (BID) | ORAL | Status: DC

## 2020-05-22 NOTE — Care Management Obs Status (Signed)
[place] NOTIFICATION   Patient Details  Name: [name] MRN: [id number] Date of Birth: Jul 07, 1965   Medicare Observation Status Notification Given:  Yes    Tommy Medal 05/22/2020, 2:21 PM

## 2020-05-22 NOTE — Discharge Summary (Signed)
Physician Discharge Summary  [name]:[id number] DOB: [birth year] DOA: 05/21/2020  PCP: [facility]  Admit date: 05/21/2020 Discharge date: 05/22/2020  Time spent: 35 minutes  Recommendations for Outpatient Follow-up:  1. Recommending further consultative discussion with initiation of home hospice if considered appropriate. 2. Repeat CMET to follow electrolytes, renal function and LFT's 3. Repeat CBC to follow blood count 4. Closely follow patient CBG's and adjust hypoglycemic regimen as required.   Discharge Diagnoses:  Principal Problem:   Hepatic encephalopathy ([place]) Active Problems:   Diabetes mellitus without complication (HCC)   Alcoholic cirrhosis of liver without ascites (HCC)   Hypoalbuminemia   Schizoaffective disorder (HCC)   Chronic liver failure without hepatic coma (HCC)   Physical deconditioning   Discharge Condition: Stable and improved.  Discharged home with home health services to facilitate transition of care.  Patient instructed to follow-up with PCP in 2 days.  Code status: DNR  Diet recommendation: Heart healthy/low-sodium diet; also multifactorial diet  Filed Weights   05/21/20 1839 05/22/20 0500  Weight: 53.6 kg 54.5 kg    History of present illness:  As per H&P completed by Dr.Zierle Orlin Hilding on 05/21/20 [name]  is a 55 y.o. female, with medical history significant for end-stage liver disease, alcoholic cirrhosis, pancreatitis, GERD, diabetes mellitus, who is no longer in outpatient hospice per her report, presents to the ED with a chief complaint of confusion.  Patient reports that she felt like her ammonia was high.  She describes her confusion is not doing what she was doing and forgetting how to do things she normally is able to do.  Patient reports that this started 5 days ago on Sunday.  She reports that she had a fall 3 days ago on Tuesday, and since that time she has had abdominal pain and nausea.  Patient reports that she  is throwing up at least twice a day.  She reports that she is compliant with her lactulose but likely throwing it up.  Her emesis appears to be undigested food.  No hematemesis.  Patient had last bowel movement yesterday.  No bowel movements today despite lactulose 3 times daily.  Patient reports her abdominal pain is on the left side and feels like a soreness.  She also reports she was recently treated for UTI with Macrobid.    Her fall was described as a trip and fall, involving a dog in the yard.  She had no preceding symptoms.  No loss of consciousness.  She describes her landing as face first, landing on her forearms, then her whole abdomen.  Patient does not smoke.  She does not drink, and she claims that she was never a heavy drinker despite her diagnosis of alcoholic cirrhosis.  She reports that she started attempting to commit suicide at a young age by overdosing on Tylenol, not continued until she was 55 years old.  She believes that is why she has cirrhosis.  Patient is not suicidal at this time.  She leans on her face for her coping mechanism.  Patient does not use illicit drugs.  Patient is DNR.  Ms. Fleissner is no longer in hospice, because she "did not like the idea of laying here until I die."  In the ED Temp 97.6, heart rate 83, respiratory rate 13, blood pressure 140/65, satting at 95% Leukopenia at 3.3-baseline seems to be between 3 and 4 Anemia with a hemoglobin of 9.1-baseline is around 9 Ammonia 140-this is the highest reading we have in  our system for her INR 1.8, albumin 2.2 CT abdomen pelvis showed cirrhosis with portal hypertension.  Small amount of pelvic free fluid decreased in the severity when compared to prior study. Chest x-ray showed stable cardiomegaly without acute disease EKG shows a heart rate of 74, sinus rhythm, QTC 458 Lactulose 30 g p.o. given in the ED Normal saline 500 bolus Of note patient had no episodes of emesis while in the ED   Hospital  Course:  1-acute hepatic encephalopathy -Ammonia 140 on presentation -Good response to 30 g lactulose 3 times a day -Ammonia level at discharge 73 -Patient mentation back to baseline. -Advised medication compliance and follow-up arranged for home health to further assist with teaching symptoms management and to follow patient's medication usage.  2-end-stage liver disease -Patient is near terminal liver disease -Continue to follow follow-up with GI service -Recommended hypertension hospice follow-up today -Continue Lasix and lactulose  3-brittle type 2 diabetes -resume hypoglycemic therapy -close monitoring by PCP as an outpatient  4-bipolar schizoaffective disorder -no SI or hallucinations -continue remeron -patient has stopped taken any other psych medication at this point (by her preference)  5-hypoalbuminemia -feeding supplements recommended  6-hypothyroidism -continue synthroid  7-pancytopenia -continue to follow blood count -not overt bleeding.  8-physical deconditioning -HH services arranged prior to discharge     Procedures:  See below for x-ray reports.  Consultations:  None  Discharge Exam: Vitals:   05/22/20 0546 05/22/20 1439  BP: (!) 109/50 (!) 126/51  Pulse: 90 93  Resp: 15 19  Temp: 98.4 F (36.9 C) 98.3 F (36.8 C)  SpO2: 98% 95%   General: Oriented to place, person and year; following commands appropriately. Continued report feeling tired.  No nausea, no vomiting, no shortness of breath or dysuria. Cardiovascular: S1-S2, no rubs, no gallops, no JVD. Respiratory: Good air movement bilaterally; no using accessory muscles. Abdomen: Soft, nontender, nondistended. Positive bowel sounds Extremities: No cyanosis or clubbing.  Discharge Instructions   Discharge Instructions    Diet - low sodium heart healthy   Complete by: As directed    Diet Carb Modified   Complete by: As directed    Discharge instructions   Complete by: As directed     Follow modify carbohydrates, low-sodium and low fat diet Compliance check medications as prescribed (especially skipping any dosages of lactulose). Arrange follow-up with PCP in 10 days. Maintain adequate hydration.     Allergies as of 05/22/2020      Reactions   Acetaminophen Hives, Other (See Comments)   Tolerates Fioricet   Cephalexin Itching, Swelling, Other (See Comments)   Tongue swells and makes throat itchy- can tolerate Zosyn   Ciprofloxacin Hives   Hydromorphone Hives   Sulfamethoxazole-trimethoprim Shortness Of Breath, Swelling, Other (See Comments)   Tongue swells      Medication List    STOP taking these medications   ARIPiprazole 5 MG tablet Commonly known as: ABILIFY   FLUoxetine 20 MG capsule Commonly known as: PROZAC   hydrOXYzine 25 MG tablet Commonly known as: ATARAX/VISTARIL   nitrofurantoin (macrocrystal-monohydrate) 100 MG capsule Commonly known as: MACROBID     TAKE these medications   albuterol (2.5 MG/3ML) 0.083% nebulizer solution Commonly known as: PROVENTIL Inhale 2.5 mg into the lungs every 6 (six) hours as needed for wheezing or shortness of breath.   albuterol 108 (90 Base) MCG/ACT inhaler Commonly known as: VENTOLIN HFA Inhale 2 puffs into the lungs every 4 (four) hours as needed for wheezing or shortness of breath.  furosemide 20 MG tablet Commonly known as: LASIX Take 20 mg by mouth in the morning and at bedtime.   gabapentin 100 MG capsule Commonly known as: NEURONTIN Take 2 capsules (200 mg total) by mouth 2 (two) times daily. For agitation/neuropathy What changed: when to take this   insulin aspart 100 UNIT/ML injection Commonly known as: novoLOG Inject 10 Units into the skin 3 (three) times daily with meals. For diabetes management What changed:   how much to take  when to take this  additional instructions   insulin pump Soln Inject into the skin. Novolog   lactulose 10 GM/15ML solution Commonly known as:  CHRONULAC Take 45 mLs (30 g total) by mouth 3 (three) times daily.   levothyroxine 50 MCG tablet Commonly known as: SYNTHROID Take 50 mcg by mouth daily before breakfast.   lipase/protease/amylase 12000-38000 units Cpep capsule Commonly known as: CREON Take 1 capsule (12,000 Units total) by mouth 3 (three) times daily before meals.   megestrol 40 MG tablet Commonly known as: MEGACE Take 1 tablet (40 mg total) by mouth daily. For Cachexia   mirtazapine 15 MG tablet Commonly known as: REMERON Take 1 tablet (15 mg total) by mouth at bedtime. For depression   ondansetron 4 MG disintegrating tablet Commonly known as: Zofran ODT Take 1 tablet (4 mg total) by mouth every 8 (eight) hours as needed. What changed:   reasons to take this  Another medication with the same name was removed. Continue taking this medication, and follow the directions you see here.   Oxycodone HCl 10 MG Tabs Take 10 mg by mouth every 6 (six) hours as needed for severe pain.   potassium chloride SA 20 MEQ tablet Commonly known as: KLOR-CON Take 1 tablet (20 mEq total) by mouth 2 (two) times daily.      Allergies  Allergen Reactions  . Acetaminophen Hives and Other (See Comments)    Tolerates Fioricet  . Cephalexin Itching, Swelling and Other (See Comments)    Tongue swells and makes throat itchy- can tolerate Zosyn  . Ciprofloxacin Hives  . Hydromorphone Hives  . Sulfamethoxazole-Trimethoprim Shortness Of Breath, Swelling and Other (See Comments)    Tongue swells     Follow-up Information    [facility]. Schedule an appointment as soon as possible for a visit in 10 day(s).   Contact information: [place] [address] [phone]               The results of significant diagnostics from this hospitalization (including imaging, microbiology, ancillary and laboratory) are listed below for reference.    Significant Diagnostic Studies: CT Abdomen Pelvis W  Contrast  Result Date: 05/21/2020 CLINICAL DATA:  Abdominal pain status post fall. EXAM: CT ABDOMEN AND PELVIS WITH CONTRAST TECHNIQUE: Multidetector CT imaging of the abdomen and pelvis was performed using the standard protocol following bolus administration of intravenous contrast. CONTRAST:  137m OMNIPAQUE IOHEXOL 300 MG/ML  SOLN COMPARISON:  April 28, 2020 FINDINGS: Lower chest: Mild atelectasis is seen within the posterior aspect of the bilateral lung bases. Hepatobiliary: The liver is cirrhotic in appearance. An 8 mm diameter cyst is seen within the posterolateral aspect of the right lobe of the liver. Status post cholecystectomy. The common bile duct is dilated and measures approximately 1.5 cm. Pancreas: Unremarkable. No pancreatic ductal dilatation or surrounding inflammatory changes. Spleen: There is moderate to marked severity splenomegaly. Adrenals/Urinary Tract: Adrenal glands are unremarkable. Kidneys are normal in size, without renal calculi or  hydronephrosis. Stable subcentimeter cysts are seen within the right kidney. Bladder is unremarkable. Stomach/Bowel: Stomach is within normal limits. The appendix is not clearly identified. No evidence of bowel wall thickening, distention, or inflammatory changes. Vascular/Lymphatic: Aortic atherosclerosis. Numerous dilated varices are seen within the upper abdomen. No enlarged abdominal or pelvic lymph nodes. Reproductive: The uterus is positioned to the right of midline and is otherwise normal in appearance. The bilateral adnexa are unremarkable. Other: No abdominal wall hernia or abnormality. A small amount of pelvic free fluid is seen. This is decreased in severity when compared to the prior study. Musculoskeletal: No acute or significant osseous findings. IMPRESSION: 1.   Cirrhosis with evidence of portal hypertension. 2. Small amount of pelvic free fluid, decreased in severity when compared to the prior study. 3.   Evidence of prior cholecystectomy.  4. Aortic atherosclerosis. Aortic Atherosclerosis (ICD10-I70.0). Electronically Signed   By: Virgina Norfolk M.D.   On: 05/21/2020 21:18   CT ABDOMEN PELVIS W CONTRAST  Result Date: 04/28/2020 CLINICAL DATA:  Generalized abdominal pain EXAM: CT ABDOMEN AND PELVIS WITH CONTRAST TECHNIQUE: Multidetector CT imaging of the abdomen and pelvis was performed using the standard protocol following bolus administration of intravenous contrast. CONTRAST:  58m OMNIPAQUE IOHEXOL 300 MG/ML  SOLN COMPARISON:  03/30/2020 ultrasound, 09/27/2019 FINDINGS: Lower chest: Lung bases are free of acute infiltrate or sizable effusion. Hepatobiliary: Gallbladder has been surgically removed. Prominence of the biliary tree is noted consistent with the post cholecystectomy state. The liver is shrunken and nodular consistent with underlying cirrhosis. The overall appearance is stable when compared with the prior exam. Stable cyst is noted in the lateral aspect of the right lobe of the liver. Pancreas: Unremarkable. No pancreatic ductal dilatation or surrounding inflammatory changes. Spleen: Spleen is enlarged consistent portal hypertension. The splenic vein is prominent and tortuous with evidence of a spontaneous splenorenal shunt Adrenals/Urinary Tract: Adrenal glands are within normal limits bilaterally. Kidneys demonstrate a normal enhancement pattern. A few scattered small cysts are noted. No renal calculi are seen. No obstructive changes are noted. The bladder is partially distended. Stomach/Bowel: No obstructive or inflammatory changes of the colon are seen. The appendix is not well visualized. No inflammatory changes to suggest appendicitis are seen. The stomach is decompressed. The small bowel shows no obstructive changes. Mild hyperemia is noted related to the portal hypertension. Vascular/Lymphatic: Atherosclerotic calcifications are noted without aneurysmal dilatation. Changes of portal hypertension are noted with spontaneous  splenorenal decompression as well as perisplenic varices. No definitive esophageal or gastric varices are seen. Reproductive: Uterus and bilateral adnexa are unremarkable. Other: Minimal ascites is noted consistent with the portal hypertension. No radiation is noted. Musculoskeletal: No acute or significant osseous findings. IMPRESSION: Changes consistent with cirrhosis of the liver with portal hypertension and splenomegaly as well as varices as described. Mild ascites related to the portal hypertension. No other focal abnormality is noted. Electronically Signed   By: MInez CatalinaM.D.   On: 04/28/2020 11:20   DG Chest Port 1 View  Result Date: 05/21/2020 CLINICAL DATA:  Confusion, generalized abdominal pain and nausea. EXAM: PORTABLE CHEST 1 VIEW COMPARISON:  February 28, 2020 FINDINGS: The lungs are hyperinflated. Mild, diffuse, chronic appearing increased lung markings are seen. There is no evidence of acute infiltrate, pleural effusion or pneumothorax. There is mild to moderate severity enlargement of the cardiac silhouette. The visualized skeletal structures are unremarkable. IMPRESSION: Stable cardiomegaly without acute cardiopulmonary disease. Electronically Signed   By: TVirgina NorfolkM.D.   On:  05/21/2020 21:22    Microbiology: Recent Results (from the past 240 hour(s))  SARS CORONAVIRUS 2 (TAT 6-24 HRS) Nasopharyngeal Nasopharyngeal Swab     Status: None   Collection Time: 05/21/20  9:30 PM   Specimen: Nasopharyngeal Swab  Result Value Ref Range Status   SARS Coronavirus 2 NEGATIVE NEGATIVE Final    Comment: (NOTE) SARS-CoV-2 target nucleic acids are NOT DETECTED.  The SARS-CoV-2 RNA is generally detectable in upper and lower respiratory specimens during the acute phase of infection. Negative results do not preclude SARS-CoV-2 infection, do not rule out co-infections with other pathogens, and should not be used as the sole basis for treatment or other patient management  decisions. Negative results must be combined with clinical observations, patient history, and epidemiological information. The expected result is Negative.  Fact Sheet for Patients: SugarRoll.be  Fact Sheet for Healthcare Providers: https://www.woods-mathews.com/  This test is not yet approved or cleared by the [place] FDA and  has been authorized for detection and/or diagnosis of SARS-CoV-2 by FDA under an Emergency Use Authorization (EUA). This EUA will remain  in effect (meaning this test can be used) for the duration of the COVID-19 declaration under Se ction 564(b)(1) of the Act, 21 U.S.C. section 360bbb-3(b)(1), unless the authorization is terminated or revoked sooner.  Performed at [facility], [place] [address] [place], [place] [address]      Labs: Basic Metabolic Panel: Recent Labs  Lab 05/21/20 1939 05/22/20 0513  NA 133* 139  K 4.0 3.7  CL 103 109  CO2 26 23  GLUCOSE 181* 100*  BUN 13 12  CREATININE 0.45 0.41*  CALCIUM 8.6* 8.4*   Liver Function Tests: Recent Labs  Lab 05/21/20 1939 05/22/20 0513  AST 48* 46*  ALT 20 19  ALKPHOS 91 77  BILITOT 3.9* 4.1*  PROT 6.0* 5.2*  ALBUMIN 2.2* 2.1*   Recent Labs  Lab 05/21/20 1939  LIPASE 38   Recent Labs  Lab 05/21/20 1939 05/22/20 0513  AMMONIA 140* 73*   CBC: Recent Labs  Lab 05/21/20 1939 05/22/20 0513  WBC 3.3* 2.8*  NEUTROABS 2.0  --   HGB 9.1* 8.5*  HCT 27.7* 26.3*  MCV 103.7* 104.0*  PLT 53* 44*   CBG: Recent Labs  Lab 05/22/20 0053 05/22/20 0745 05/22/20 1102 05/22/20 1531  GLUCAP 134* 134* 202* 243*    Signed:  Barton Dubois MD.  [facility] 05/22/2020, 4:19 PM

## 2020-05-22 NOTE — Discharge Instructions (Signed)
[id number] 

## 2020-05-22 NOTE — TOC Initial Note (Signed)
Transition of Care [facility]) - Initial/Assessment Note    Patient Details  Name: [name] MRN: [id number] Date of Birth: [birth year]  Transition of [facility]) CM/SW Contact:    Ihor Gully, LCSW Phone Number: 05/22/2020, 4:54 PM  Clinical Narrative:                 Patient from home. Has DME (multiple canes, rollator, walker, wheelchair, bsc, [place], hospital bed, oxygen, nebulizer, inhaler). Agreeable to [facility] (RN, PT, aide). Alvis Lemmings is accepting of patient's insurance and referral. TOC signing off.   Expected Discharge Plan: [place] Barriers to Discharge: No Barriers Identified   Patient Goals and CMS Choice Patient states their goals for this hospitalization and ongoing recovery are:: return home      Expected Discharge Plan and Services Expected Discharge Plan: [place]         Expected Discharge Date: 05/22/20                         HH Arranged: RN,PT,Nurse's Aide HH Agency: [place] Date [place]: 05/22/20 Time [place] Agency Contacted: 2 Representative spoke with at [place]: Tommi Rumps  Prior Living Arrangements/Services                  Current home services: DME    Activities of Daily Living Home Assistive Devices/Equipment: Radio producer (specify quad or straight),Walker (specify type) ADL Screening (condition at time of admission) Patient's cognitive ability adequate to safely complete daily activities?: Yes Is the patient deaf or have difficulty hearing?: No Does the patient have difficulty seeing, even when wearing glasses/contacts?: No Does the patient have difficulty concentrating, remembering, or making decisions?: No Patient able to express need for assistance with ADLs?: Yes Does the patient have difficulty dressing or bathing?: No Independently performs ADLs?: Yes (appropriate for developmental age) Does the patient have difficulty walking or climbing stairs?: No Weakness of Legs:  None Weakness of Arms/Hands: None  Permission Sought/Granted                  Emotional Assessment              Admission diagnosis:  Hepatic encephalopathy ([place]) [K72.90] Patient Active Problem List   Diagnosis Date Noted  . Chronic liver failure without hepatic coma ([place])   . Physical deconditioning   . Hepatic encephalopathy ([place]) 05/21/2020  . Anemia   . Coagulopathy ([place])   . Ascites 03/30/2020  . Abnormal weight gain 03/30/2020  . Severe recurrent major depressive disorder with psychotic features ([place]) 10/05/2019  . Major depressive disorder, single episode, severe with psychotic features ([place]) 10/04/2019  . Abnormal head CT 04/02/2019  . Multiple falls 04/02/2019  . Stroke-like symptoms 04/02/2019  . Panic attack 03/30/2019  . History of COPD 03/20/2019  . Chronic abdominal pain 02/05/2019  . DKA (diabetic ketoacidoses) 01/26/2019  . Sleep apnea   . Gastroesophageal reflux   . Hepatitis A 01/13/2019  . 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 COVID-19 01/12/2019  . Glycosuria 01/12/2019  . Hyperbilirubinemia 01/12/2019  . Hypoalbuminemia 01/12/2019  . Normocytic anemia, not due to blood loss 01/12/2019  . Uncontrolled type 2 diabetes mellitus with hyperglycemia, with long-term current use of insulin ([place]) 01/12/2019  . Low TSH level 01/12/2019  . Chest pain 12/16/2018  . Left-sided weakness 12/16/2018  . Weight loss 12/16/2018  . Schizoaffective disorder ([place]) 03/10/2018  . Essential  hypertension 03/09/2018  . H/O partial thyroidectomy 10/25/2017  . Thyroid nodule 10/25/2017  . Cricopharyngeal hypertrophy 09/26/2017  . Pharyngoesophageal dysphagia 09/26/2017  . Reaction to severe stress 09/11/2017  . Left hemiparesis ([place]) 04/18/2017  . Disorder of shoulder 03/03/2017  . Lactic acidosis 09/27/2016  . Pancytopenia ([place]) 09/27/2016  . Acquired hypothyroidism 09/27/2016  . Microalbuminuria due to type 2 diabetes mellitus ([place])  04/20/2016  . Diabetic peripheral neuropathy associated with type 2 diabetes mellitus ([place]) 12/03/2015  . Hepatic steatosis 12/03/2015  . Diabetes mellitus without complication ([place]) 50/15/8682  . Moderate episode of recurrent major depressive disorder ([place]) 03/20/2015  . Complicated UTI (urinary tract infection) 12/25/2014  . Vaginitis and vulvovaginitis 11/06/2014  . Blurry vision, bilateral 10/17/2014  . Health care maintenance 10/17/2014  . Nausea 10/17/2014  . Multinodular thyroid 07/01/2014  . Cirrhosis of liver ([place]) 03/05/2014  . Postoperative hypothyroidism 03/05/2014  . Type 2 diabetes mellitus ([place]) 03/05/2014  . Thrombocytopenia ([place]) 01/17/2014  . Alcoholic cirrhosis of liver without ascites ([place]) 01/17/2014  . Back problem 05/28/2013  . Low back pain 04/12/2013  . Borderline personality disorder ([place]) 08/25/2012  . Depression 08/25/2012  . Hypercholesterolemia 08/25/2012  . Osteoarthrosis 08/25/2012  . Gastro-esophageal reflux disease without esophagitis 08/25/2012  . Obstructive sleep apnea 08/25/2012   PCP:  [facility], [place]:   [place], [place] [address] [place] [address] Phone: [phone] Fax: [phone]     Social Determinants of Health (SDOH) Interventions    Readmission Risk Interventions No flowsheet data found.

## 2020-05-22 NOTE — Plan of Care (Signed)
  Problem: Acute Rehab PT Goals(only PT should resolve) Goal: Patient Will Transfer Sit To/From Stand Outcome: Progressing Flowsheets (Taken 05/22/2020 1220) Patient will transfer sit to/from stand: with modified independence Goal: Pt Will Transfer Bed To Chair/Chair To Bed Outcome: Progressing Flowsheets (Taken 05/22/2020 1220) Pt will Transfer Bed to Chair/Chair to Bed: with modified independence Goal: Pt Will Ambulate Outcome: Progressing Flowsheets (Taken 05/22/2020 1220) Pt will Ambulate:  100 feet  with modified independence  with least restrictive assistive device Goal: Pt/caregiver will Perform Home Exercise Program Outcome: Progressing Flowsheets (Taken 05/22/2020 1220) Pt/caregiver will Perform Home Exercise Program:  For increased strengthening  For improved balance  Independently    12:21 PM, 05/22/20 Mearl Latin PT, DPT Physical Therapist at [facility]

## 2020-05-22 NOTE — Evaluation (Signed)
Physical Therapy Evaluation Patient Details Name: [name] MRN: [id number] DOB: October 30, 1965 Today's Date: 05/22/2020   History of Present Illness  [name]  is a 55 y.o. female, with medical history significant for end-stage liver disease, alcoholic cirrhosis, pancreatitis, GERD, diabetes mellitus, who is no longer in outpatient hospice per her report, presents to the ED with a chief complaint of confusion.  Patient reports that she felt like her ammonia was high.  She describes her confusion is not doing what she was doing and forgetting how to do things she normally is able to do.  Patient reports that this started 5 days ago on Sunday.  She reports that she had a fall 3 days ago on Tuesday, and since that time she has had abdominal pain and nausea.  Patient reports that she is throwing up at least twice a day.  She reports that she is compliant with her lactulose but likely throwing it up.  Her emesis appears to be undigested food.  No hematemesis.  Patient had last bowel movement yesterday.  No bowel movements today despite lactulose 3 times daily.  Patient reports her abdominal pain is on the left side and feels like a soreness.  She also reports she was recently treated for UTI with Macrobid.       Her fall was described as a trip and fall, involving a dog in the yard.  She had no preceding symptoms.  No loss of consciousness.  She describes her landing as face first, landing on her forearms, then her whole abdomen.     Patient does not smoke.  She does not drink, and she claims that she was never a heavy drinker despite her diagnosis of alcoholic cirrhosis.  She reports that she started attempting to commit suicide at a young age by overdosing on Tylenol, not continued until she was 55 years old.  She believes that is why she has cirrhosis.  Patient is not suicidal at this time.  She leans on her face for her coping mechanism.  Patient does not use illicit drugs.  Patient is DNR.  [name] is no longer in hospice, because she "did not like the idea of laying here until I die."    Clinical Impression  Patient limited for functional mobility as stated below secondary to BLE weakness, fatigue and impaired standing balance. Patient does not require assist for bed mobility but requires use of bed rails with HOB elevated to transition to seated EOB. Patient demonstrates good sitting balance and sitting tolerance today. She transfers to standing with min assist for strength deficits along with RW and is unsteady upon standing. Patient ambulates with slow, labored cadence with RW with intermittent unsteadiness but no loss of balance or fall. Patient limited by fatigue and impaired activity tolerance. Patient returned to bed at end of session. Patient eager to improve function and decrease her risk for falls as she is currently a high risk for falling with history of frequent falls.  Patient will benefit from continued physical therapy in hospital and recommended venue below to increase strength, balance, endurance for safe ADLs and gait.     Follow Up Recommendations SNF    Equipment Recommendations  None recommended by PT    Recommendations for Other Services       Precautions / Restrictions Precautions Precautions: Fall Restrictions Weight Bearing Restrictions: No      Mobility  Bed Mobility Overal bed mobility: Modified Independent  General bed mobility comments: slow, labored transition to seated EOB with use of bed rails with HOB elevated    Transfers Overall transfer level: Needs assistance Equipment used: Rolling walker (2 wheeled) Transfers: Sit to/from [facility] Sit to Stand: Min assist Stand pivot transfers: Min assist       General transfer comment: assist for weakness to transfer to standing with RW, unsteady upon standing  Ambulation/Gait Ambulation/Gait assistance: Min assist Gait Distance (Feet): 50  Feet Assistive device: Rolling walker (2 wheeled) Gait Pattern/deviations: Decreased step length - left;Decreased step length - right;Decreased stride length;Trunk flexed Gait velocity: decrased   General Gait Details: patient ambulates with slow, labored candence with use of RW, limited by fatigue, intermittent unsteadiness  Stairs            Wheelchair Mobility    Modified Rankin (Stroke Patients Only)       Balance Overall balance assessment: Needs assistance Sitting-balance support: Feet supported Sitting balance-Leahy Scale: Good Sitting balance - Comments: seated EOB     Standing balance-Leahy Scale: Fair Standing balance comment: fair/good with RW                             Pertinent Vitals/Pain Pain Assessment: Faces Faces Pain Scale: Hurts little more Pain Location: back and legs Pain Descriptors / Indicators: Sore Pain Intervention(s): Limited activity within patient's tolerance;Monitored during session;Repositioned    Home Living Family/patient expects to be discharged to:: Private residence Living Arrangements: Alone Available Help at Discharge: Friend(s);Available PRN/intermittently Type of Home: House Home Access: Ramped entrance     Home Layout: One level Home Equipment: Walker - 2 wheels;Walker - 4 wheels;Cane - single point;Hospital bed;Bedside commode      Prior Function Level of Independence: Needs assistance   Gait / Transfers Assistance Needed: Ambulates with AD PRN  ADL's / Homemaking Assistance Needed: friend assists with increasing assist needed recently        Hand Dominance        Extremity/Trunk Assessment   Upper Extremity Assessment Upper Extremity Assessment: Generalized weakness    Lower Extremity Assessment Lower Extremity Assessment: Generalized weakness    Cervical / Trunk Assessment Cervical / Trunk Assessment: Normal  Communication   Communication: No difficulties  Cognition  Arousal/Alertness: Awake/alert Behavior During Therapy: WFL for tasks assessed/performed Overall Cognitive Status: Within Functional Limits for tasks assessed                                        General Comments      Exercises     Assessment/Plan    PT Assessment Patient needs continued PT services  PT Problem List Decreased strength;Decreased activity tolerance;Decreased balance;Decreased mobility;Decreased safety awareness       PT Treatment Interventions DME instruction;Balance training;Gait training;Neuromuscular re-education;Stair training;Functional mobility training;Patient/family education;Therapeutic activities;Therapeutic exercise;Manual techniques    PT Goals (Current goals can be found in the Care Plan section)  Acute Rehab PT Goals Patient Stated Goal: get stronger and go home PT Goal Formulation: With patient Time For Goal Achievement: 06/05/20 Potential to Achieve Goals: Good    Frequency Min 3X/week   Barriers to discharge        Co-evaluation               AM-PAC PT "6 Clicks" Mobility  Outcome Measure Help needed turning from your back to your  side while in a flat bed without using bedrails?: None Help needed moving from lying on your back to sitting on the side of a flat bed without using bedrails?: A Little Help needed moving to and from a bed to a chair (including a wheelchair)?: A Little Help needed standing up from a chair using your arms (e.g., wheelchair or bedside chair)?: A Little Help needed to walk in hospital room?: A Little Help needed climbing 3-5 steps with a railing? : A Lot 6 Click Score: 18    End of Session Equipment Utilized During Treatment: Gait belt Activity Tolerance: Patient tolerated treatment well;Patient limited by fatigue Patient left: in bed;with call bell/phone within reach Nurse Communication: Mobility status PT Visit Diagnosis: Unsteadiness on feet (R26.81);Other abnormalities of gait and  mobility (R26.89);Muscle weakness (generalized) (M62.81)    Time: 4332-9518 PT Time Calculation (min) (ACUTE ONLY): 20 min   Charges:   PT Evaluation $PT Eval Low Complexity: 1 Low PT Treatments $Therapeutic Activity: 8-22 mins        12:18 PM, 05/22/20 Mearl Latin PT, DPT Physical Therapist at [facility]

## 2020-05-22 NOTE — Progress Notes (Signed)
Discharge instructions reviewed with patient. Patient verbalized understanding of instructions. Patient discharged home in stable condition.

## 2020-06-05 ENCOUNTER — Other Ambulatory Visit: Payer: Self-pay

## 2020-06-05 ENCOUNTER — Encounter (HOSPITAL_COMMUNITY): Payer: Self-pay | Admitting: Emergency Medicine

## 2020-06-05 ENCOUNTER — Emergency Department (HOSPITAL_COMMUNITY)
Admission: EM | Admit: 2020-06-05 | Discharge: 2020-06-11 | Disposition: A | Discharge status: Home / Self Care | Payer: Medicare HMO | Attending: Emergency Medicine | Admitting: Emergency Medicine

## 2020-06-05 DIAGNOSIS — Z794 Long term (current) use of insulin: Secondary | ICD-10-CM | POA: Diagnosis not present

## 2020-06-05 DIAGNOSIS — R4589 Other symptoms and signs involving emotional state: Secondary | ICD-10-CM | POA: Insufficient documentation

## 2020-06-05 DIAGNOSIS — I10 Essential (primary) hypertension: Secondary | ICD-10-CM | POA: Insufficient documentation

## 2020-06-05 DIAGNOSIS — Z72820 Sleep deprivation: Secondary | ICD-10-CM | POA: Diagnosis not present

## 2020-06-05 DIAGNOSIS — R4584 Anhedonia: Secondary | ICD-10-CM | POA: Diagnosis not present

## 2020-06-05 DIAGNOSIS — E039 Hypothyroidism, unspecified: Secondary | ICD-10-CM | POA: Insufficient documentation

## 2020-06-05 DIAGNOSIS — Z20822 Contact with and (suspected) exposure to covid-19: Secondary | ICD-10-CM | POA: Insufficient documentation

## 2020-06-05 DIAGNOSIS — F339 Major depressive disorder, recurrent, unspecified: Secondary | ICD-10-CM | POA: Diagnosis not present

## 2020-06-05 DIAGNOSIS — Z7984 Long term (current) use of oral hypoglycemic drugs: Secondary | ICD-10-CM | POA: Insufficient documentation

## 2020-06-05 DIAGNOSIS — R45851 Suicidal ideations: Secondary | ICD-10-CM | POA: Diagnosis not present

## 2020-06-05 DIAGNOSIS — J449 Chronic obstructive pulmonary disease, unspecified: Secondary | ICD-10-CM | POA: Diagnosis not present

## 2020-06-05 DIAGNOSIS — E038 Other specified hypothyroidism: Secondary | ICD-10-CM | POA: Diagnosis not present

## 2020-06-05 DIAGNOSIS — Z79899 Other long term (current) drug therapy: Secondary | ICD-10-CM | POA: Insufficient documentation

## 2020-06-05 DIAGNOSIS — Z8616 Personal history of COVID-19: Secondary | ICD-10-CM | POA: Insufficient documentation

## 2020-06-05 DIAGNOSIS — E1140 Type 2 diabetes mellitus with diabetic neuropathy, unspecified: Secondary | ICD-10-CM | POA: Diagnosis not present

## 2020-06-05 DIAGNOSIS — F333 Major depressive disorder, recurrent, severe with psychotic symptoms: Secondary | ICD-10-CM | POA: Diagnosis not present

## 2020-06-05 LAB — COMPREHENSIVE METABOLIC PANEL
ALT: 18 U/L (ref 0–44)
AST: 45 U/L — ABNORMAL HIGH (ref 15–41)
Albumin: 2.3 g/dL — ABNORMAL LOW (ref 3.5–5.0)
Alkaline Phosphatase: 89 U/L (ref 38–126)
Anion gap: 4 — ABNORMAL LOW (ref 5–15)
BUN: 8 mg/dL (ref 6–20)
CO2: 26 mmol/L (ref 22–32)
Calcium: 8.7 mg/dL — ABNORMAL LOW (ref 8.9–10.3)
Chloride: 107 mmol/L (ref 98–111)
Creatinine, Ser: 0.56 mg/dL (ref 0.44–1.00)
GFR, Estimated: >60 mL/min (ref >60)
Glucose, Bld: 217 mg/dL — ABNORMAL HIGH (ref 70–99)
Potassium: 3.7 mmol/L (ref 3.5–5.1)
Sodium: 137 mmol/L (ref 135–145)
Total Bilirubin: 5.1 mg/dL — ABNORMAL HIGH (ref 0.3–1.2)
Total Protein: 6.1 g/dL — ABNORMAL LOW (ref 6.5–8.1)

## 2020-06-05 LAB — CBC
HCT: 28.9 % — ABNORMAL LOW (ref 36.0–46.0)
Hemoglobin: 9.8 g/dL — ABNORMAL LOW (ref 12.0–15.0)
MCH: 34.4 pg — ABNORMAL HIGH (ref 26.0–34.0)
MCHC: 33.9 g/dL (ref 30.0–36.0)
MCV: 101.4 fL — ABNORMAL HIGH (ref 80.0–100.0)
Platelets: 49 10*3/uL — ABNORMAL LOW (ref 150–400)
RBC: 2.85 MIL/uL — ABNORMAL LOW (ref 3.87–5.11)
RDW: 15.3 % (ref 11.5–15.5)
WBC: 4.3 10*3/uL (ref 4.0–10.5)
nRBC: 0 % (ref 0.0–0.2)

## 2020-06-05 LAB — RAPID URINE DRUG SCREEN, HOSP PERFORMED
Amphetamines: NOT DETECTED
Barbiturates: NOT DETECTED
Benzodiazepines: NOT DETECTED
Cocaine: NOT DETECTED
Opiates: POSITIVE — AB
Tetrahydrocannabinol: NOT DETECTED

## 2020-06-05 LAB — I-STAT BETA HCG BLOOD, ED (MC, WL, AP ONLY): I-stat hCG, quantitative: 5.1 m[IU]/mL — ABNORMAL HIGH (ref <5)

## 2020-06-05 NOTE — ED Triage Notes (Signed)
Patient reports suicidal ideation plans to overdose on medications with auditory and visual hallucinations .

## 2020-06-06 DIAGNOSIS — F339 Major depressive disorder, recurrent, unspecified: Secondary | ICD-10-CM | POA: Diagnosis not present

## 2020-06-06 LAB — SALICYLATE LEVEL: Salicylate Lvl: <7 mg/dL — ABNORMAL LOW (ref 7.0–30.0)

## 2020-06-06 LAB — CBG MONITORING, ED
Glucose-Capillary: 155 mg/dL — ABNORMAL HIGH (ref 70–99)
Glucose-Capillary: 96 mg/dL (ref 70–99)
Glucose-Capillary: 212 mg/dL — ABNORMAL HIGH (ref 70–99)
Glucose-Capillary: 150 mg/dL — ABNORMAL HIGH (ref 70–99)

## 2020-06-06 LAB — AMMONIA: Ammonia: 75 umol/L — ABNORMAL HIGH (ref 9–35)

## 2020-06-06 LAB — ACETAMINOPHEN LEVEL: Acetaminophen (Tylenol), Serum: <10 ug/mL — ABNORMAL LOW (ref 10–30)

## 2020-06-06 LAB — SARS CORONAVIRUS 2 (TAT 6-24 HRS): SARS Coronavirus 2: NEGATIVE

## 2020-06-06 LAB — ETHANOL: Alcohol, Ethyl (B): <10 mg/dL (ref <10)

## 2020-06-06 MED ORDER — FUROSEMIDE 20 MG PO TABS
20.0000 mg | ORAL_TABLET | Freq: Two times a day (BID) | ORAL | Status: DC
  Administered 2020-06-06 – 2020-06-11 (×11): 20 mg via ORAL
  Filled 2020-06-06 (×10): qty 1

## 2020-06-06 MED ORDER — LEVOTHYROXINE SODIUM 50 MCG PO TABS
50.0000 ug | ORAL_TABLET | Freq: Every day | ORAL | Status: DC
  Administered 2020-06-06 – 2020-06-11 (×6): 50 ug via ORAL
  Filled 2020-06-06 (×7): qty 1

## 2020-06-06 MED ORDER — ALBUTEROL SULFATE (2.5 MG/3ML) 0.083% IN NEBU
2.5000 mg | INHALATION_SOLUTION | Freq: Four times a day (QID) | RESPIRATORY_TRACT | Status: DC | PRN
Start: 2020-06-06 — End: 2020-06-11

## 2020-06-06 MED ORDER — OXYCODONE HCL 5 MG PO TABS
10.0000 mg | ORAL_TABLET | Freq: Four times a day (QID) | ORAL | Status: DC | PRN
  Administered 2020-06-06 – 2020-06-10 (×9): 10 mg via ORAL
  Filled 2020-06-06 (×10): qty 2

## 2020-06-06 MED ORDER — ONDANSETRON 4 MG PO TBDP
4.0000 mg | ORAL_TABLET | Freq: Once | ORAL | Status: AC
  Administered 2020-06-06: 4 mg via ORAL
  Filled 2020-06-06: qty 1

## 2020-06-06 MED ORDER — POTASSIUM CHLORIDE CRYS ER 20 MEQ PO TBCR
20.0000 meq | EXTENDED_RELEASE_TABLET | Freq: Two times a day (BID) | ORAL | Status: DC
  Administered 2020-06-06 – 2020-06-11 (×12): 20 meq via ORAL
  Filled 2020-06-06 (×12): qty 1

## 2020-06-06 MED ORDER — GABAPENTIN 100 MG PO CAPS
200.0000 mg | ORAL_CAPSULE | Freq: Two times a day (BID) | ORAL | Status: DC
  Administered 2020-06-06 – 2020-06-11 (×12): 200 mg via ORAL
  Filled 2020-06-06 (×13): qty 2

## 2020-06-06 MED ORDER — FLUOXETINE HCL 10 MG PO CAPS
10.0000 mg | ORAL_CAPSULE | Freq: Every day | ORAL | Status: DC
  Administered 2020-06-09 – 2020-06-11 (×2): 10 mg via ORAL
  Filled 2020-06-06 (×4): qty 1

## 2020-06-06 MED ORDER — PANCRELIPASE (LIP-PROT-AMYL) 12000-38000 UNITS PO CPEP
12000.0000 [IU] | ORAL_CAPSULE | Freq: Three times a day (TID) | ORAL | Status: DC
  Administered 2020-06-06 – 2020-06-11 (×15): 12000 [IU] via ORAL
  Filled 2020-06-06 (×23): qty 1

## 2020-06-06 MED ORDER — LACTULOSE 10 GM/15ML PO SOLN
30.0000 g | Freq: Three times a day (TID) | ORAL | Status: DC
  Administered 2020-06-06 – 2020-06-10 (×15): 30 g via ORAL
  Filled 2020-06-06 (×16): qty 60

## 2020-06-06 NOTE — Progress Notes (Signed)
Per Melbourne Abts, PA-C, patient meets criteria for inpatient treatment. There are no available or appropriate beds at [facility] today. CSW faxed referrals to the following facilities for review:  [facility] [place] [facility]  [facility]  TTS will continue to seek bed placement.  Crissie Reese, MSW, LCSW-A, LCAS-A Phone: [phone] Disposition/TOC

## 2020-06-06 NOTE — BH Assessment (Deleted)
Pt unable to participate in assessment; Per Colletta Maryland RN, TTS to see Pt at a later Time.  TTS was able to contact parent Ramonita Lab, mother, [phone] and gather ccollateral information.

## 2020-06-06 NOTE — ED Notes (Signed)
TTS in process 

## 2020-06-06 NOTE — ED Provider Notes (Signed)
[facility] EMERGENCY DEPARTMENT Provider Note  CSN: [id number] Arrival date & time: 06/05/20 2005  Chief Complaint(s) Suicidal  HPI [name] is a 55 y.o. female  With past medical history listed below who presents to the emergency department with suicidal ideations with a plan to overdose on medications. She reports prior times in the past. Reports that she is depressed because of all her medical complications and the fact that her doctors have told her that she does not have long to live due to her liver and pancreas shutting down on her. She reports having anhedonia, sleep deprivation, and being labile/tearful. She endorses auditory hallucinations that tell her she should not be here any longer. She denies any illicit drug use. No alcohol use.  Of note patient was recently admitted in the beginning of March for hepatic encephalopathy.  She reports that she has been compliant with all her medication. She is alert and oriented x4. She is here voluntarily.  HPI  Past Medical History Past Medical History:  Diagnosis Date  . Degenerative joint disease   . Diabetes mellitus without complication ([place])   . Gastroesophageal reflux   . Pancreatitis   . Panic attack   . Sleep apnea   . Thrombocytopenia [facility])    Patient Active Problem List   Diagnosis Date Noted  . Chronic liver failure without hepatic coma ([place])   . Physical deconditioning   . Hepatic encephalopathy ([place]) 05/21/2020  . Anemia   . Coagulopathy ([place])   . Ascites 03/30/2020  . Abnormal weight gain 03/30/2020  . Severe recurrent major depressive disorder with psychotic features ([place]) 10/05/2019  . Major depressive disorder, single episode, severe with psychotic features ([place]) 10/04/2019  . Abnormal head CT 04/02/2019  . Multiple falls 04/02/2019  . Stroke-like symptoms 04/02/2019  . Panic attack 03/30/2019  . History of COPD 03/20/2019  . Chronic abdominal pain 02/05/2019  . DKA  (diabetic ketoacidoses) 01/26/2019  . Sleep apnea   . Gastroesophageal reflux   . Hepatitis A 01/13/2019  . 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[place]) 01/12/2019  . COVID-19 01/12/2019  . Glycosuria 01/12/2019  . Hyperbilirubinemia 01/12/2019  . Hypoalbuminemia 01/12/2019  . Normocytic anemia, not due to blood loss 01/12/2019  . Uncontrolled type 2 diabetes mellitus with hyperglycemia, with long-term current use of insulin ([place]) 01/12/2019  . Low TSH level 01/12/2019  . Chest pain 12/16/2018  . Left-sided weakness 12/16/2018  . Weight loss 12/16/2018  . Schizoaffective disorder ([place]) 03/10/2018  . Essential hypertension 03/09/2018  . H/O partial thyroidectomy 10/25/2017  . Thyroid nodule 10/25/2017  . Cricopharyngeal hypertrophy 09/26/2017  . Pharyngoesophageal dysphagia 09/26/2017  . Reaction to severe stress 09/11/2017  . Left hemiparesis ([place]) 04/18/2017  . Disorder of shoulder 03/03/2017  . Lactic acidosis 09/27/2016  . Pancytopenia ([place]) 09/27/2016  . Acquired hypothyroidism 09/27/2016  . Microalbuminuria due to type 2 diabetes mellitus ([place]) 04/20/2016  . Diabetic peripheral neuropathy associated with type 2 diabetes mellitus ([place]) 12/03/2015  . Hepatic steatosis 12/03/2015  . Diabetes mellitus without complication ([place]) 94/58/5929  . Moderate episode of recurrent major depressive disorder ([place]) 03/20/2015  . Complicated UTI (urinary tract infection) 12/25/2014  . Vaginitis and vulvovaginitis 11/06/2014  . Blurry vision, bilateral 10/17/2014  . Health care maintenance 10/17/2014  . Nausea 10/17/2014  . Multinodular thyroid 07/01/2014  . Cirrhosis of liver ([place]) 03/05/2014  . Postoperative hypothyroidism 03/05/2014  . Type 2 diabetes mellitus ([place]) 03/05/2014  . Thrombocytopenia ([place]) 01/17/2014  . Alcoholic cirrhosis  of liver without ascites ([place]) 01/17/2014  . Back problem 05/28/2013  . Low back pain 04/12/2013  . Borderline personality disorder ([place])  08/25/2012  . Depression 08/25/2012  . Hypercholesterolemia 08/25/2012  . Osteoarthrosis 08/25/2012  . Gastro-esophageal reflux disease without esophagitis 08/25/2012  . Obstructive sleep apnea 08/25/2012   Home Medication(s) Prior to Admission medications   Medication Sig Start Date End Date Taking? Authorizing Provider  albuterol (PROVENTIL) (2.5 MG/3ML) 0.083% nebulizer solution Inhale 2.5 mg into the lungs every 6 (six) hours as needed for wheezing or shortness of breath.  04/19/19   [provider]  albuterol (VENTOLIN HFA) 108 (90 Base) MCG/ACT inhaler Inhale 2 puffs into the lungs every 4 (four) hours as needed for wheezing or shortness of breath. 04/02/20   Roxan Hockey, MD  furosemide (LASIX) 20 MG tablet Take 20 mg by mouth in the morning and at bedtime.    [provider]  gabapentin (NEURONTIN) 100 MG capsule Take 2 capsules (200 mg total) by mouth 2 (two) times daily. For agitation/neuropathy 05/22/20   Barton Dubois, MD  insulin aspart (NOVOLOG) 100 UNIT/ML injection Inject 10 Units into the skin 3 (three) times daily with meals. For diabetes management Patient taking differently: Inject into the skin. Via Insulin Pump 10/10/19   Lindell Spar I, NP  Insulin Human (INSULIN PUMP) SOLN Inject into the skin. Novolog    [provider]  lactulose (CHRONULAC) 10 GM/15ML solution Take 45 mLs (30 g total) by mouth 3 (three) times daily. 04/02/20   Roxan Hockey, MD  levothyroxine (SYNTHROID, LEVOTHROID) 50 MCG tablet Take 50 mcg by mouth daily before breakfast.  07/25/14   [provider]  lipase/protease/amylase (CREON) 12000-38000 units CPEP capsule Take 1 capsule (12,000 Units total) by mouth 3 (three) times daily before meals. 04/02/20   Roxan Hockey, MD  megestrol (MEGACE) 40 MG tablet Take 1 tablet (40 mg total) by mouth daily. For Cachexia Patient not taking: No sig reported 10/11/19   Lindell Spar I, NP  mirtazapine (REMERON) 15 MG tablet Take  1 tablet (15 mg total) by mouth at bedtime. For depression Patient not taking: No sig reported 10/10/19   Lindell Spar I, NP  ondansetron (ZOFRAN ODT) 4 MG disintegrating tablet Take 1 tablet (4 mg total) by mouth every 8 (eight) hours as needed. Patient taking differently: Take 4 mg by mouth every 8 (eight) hours as needed for nausea or vomiting. 05/09/20   Ripley Fraise, MD  Oxycodone HCl 10 MG TABS Take 10 mg by mouth every 6 (six) hours as needed for severe pain. 03/06/20   [provider]  potassium chloride SA (KLOR-CON) 20 MEQ tablet Take 1 tablet (20 mEq total) by mouth 2 (two) times daily. 04/02/20   Roxan Hockey, MD  lisinopril (ZESTRIL) 10 MG tablet Take 10 mg by mouth daily. Patient not taking: Reported on 10/02/2019 03/29/19 10/02/19  [provider]  metFORMIN (GLUCOPHAGE) 1000 MG tablet Take 1,000 mg by mouth 2 (two) times daily with a meal.  Patient not taking: Reported on 10/02/2019  10/02/19  [provider]  metoCLOPramide (REGLAN) 10 MG tablet Take 10 mg by mouth 4 (four) times daily.  Patient not taking: Reported on 10/02/2019  10/02/19  [provider]  sertraline (ZOLOFT) 100 MG tablet Take 150 mg by mouth daily. Patient not taking: Reported on 10/02/2019 09/10/16 10/02/19  [provider]  spironolactone (ALDACTONE) 50 MG tablet Take 50 mg by mouth daily. Patient not taking: Reported on 10/02/2019 03/27/19  10/02/19  [provider]  traZODone (DESYREL) 100 MG tablet Take 1 tablet by mouth daily. Patient not taking: Reported on 10/02/2019 09/15/16 10/02/19  [provider]                                                                                                                                    Past Surgical History Past Surgical History:  Procedure Laterality Date  . CHOLECYSTECTOMY    . SHOULDER SURGERY     left   . tongue sx. titanium screw placed to hold tongue down    . TOTAL THYROIDECTOMY     Family  History No family history on file.  Social History Social History   Tobacco Use  . Smoking status: Never Smoker  . Smokeless tobacco: Never Used  Vaping Use  . Vaping Use: Never used  Substance Use Topics  . Alcohol use: No  . Drug use: No   Allergies Acetaminophen, Cephalexin, Ciprofloxacin, Hydromorphone, and Sulfamethoxazole-trimethoprim  Review of Systems Review of Systems All other systems are reviewed and are negative for acute change except as noted in the HPI  Physical Exam Vital Signs  I have reviewed the triage vital signs BP (!) 144/62 (BP Location: Left Arm)   Pulse 86   Temp 98.5 F (36.9 C)   Resp 18   Ht 5' 3"  (1.6 m)   Wt 58 kg   SpO2 98%   BMI 22.65 kg/m   Physical Exam Vitals reviewed.  Constitutional:      General: She is not in acute distress.    Appearance: She is well-developed. She is not diaphoretic.  HENT:     Head: Normocephalic and atraumatic.     Nose: Nose normal.  Eyes:     General: No scleral icterus.       Right eye: No discharge.        Left eye: No discharge.     Conjunctiva/sclera: Conjunctivae normal.     Pupils: Pupils are equal, round, and reactive to light.  Cardiovascular:     Rate and Rhythm: Normal rate and regular rhythm.     Heart sounds: No murmur heard. No friction rub. No gallop.   Pulmonary:     Effort: Pulmonary effort is normal. No respiratory distress.     Breath sounds: Normal breath sounds. No stridor. No rales.  Abdominal:     General: There is no distension.     Palpations: Abdomen is soft.     Tenderness: There is no abdominal tenderness.  Musculoskeletal:        General: No tenderness.     Cervical back: Normal range of motion and neck supple.  Skin:    General: Skin is warm and dry.     Findings: No erythema or rash.  Neurological:     Mental Status: She is alert and oriented to person, place, and time.  Psychiatric:  Attention and Perception: Attention normal.        Mood and  Affect: Mood is depressed. Affect is tearful.        Speech: Speech normal.        Behavior: Behavior normal. Behavior is cooperative.        Thought Content: Thought content includes suicidal ideation. Thought content includes suicidal plan.     ED Results and Treatments Labs (all labs ordered are listed, but only abnormal results are displayed) Labs Reviewed  COMPREHENSIVE METABOLIC PANEL - Abnormal; Notable for the following components:      Result Value   Glucose, Bld 217 (*)    Calcium 8.7 (*)    Total Protein 6.1 (*)    Albumin 2.3 (*)    AST 45 (*)    Total Bilirubin 5.1 (*)    Anion gap 4 (*)    All other components within normal limits  CBC - Abnormal; Notable for the following components:   RBC 2.85 (*)    Hemoglobin 9.8 (*)    HCT 28.9 (*)    MCV 101.4 (*)    MCH 34.4 (*)    Platelets 49 (*)    All other components within normal limits  RAPID URINE DRUG SCREEN, HOSP PERFORMED - Abnormal; Notable for the following components:   Opiates POSITIVE (*)    All other components within normal limits  ACETAMINOPHEN LEVEL - Abnormal; Notable for the following components:   Acetaminophen (Tylenol), Serum <10 (*)    All other components within normal limits  SALICYLATE LEVEL - Abnormal; Notable for the following components:   Salicylate Lvl <1.6 (*)    All other components within normal limits  AMMONIA - Abnormal; Notable for the following components:   Ammonia 75 (*)    All other components within normal limits  I-STAT BETA HCG BLOOD, ED ([facility], [facility], [facility] ONLY) - Abnormal; Notable for the following components:   I-stat hCG, quantitative 5.1 (*)    All other components within normal limits  CBG MONITORING, ED - Abnormal; Notable for the following components:   Glucose-Capillary 155 (*)    All other components within normal limits  SARS CORONAVIRUS 2 (TAT 6-24 HRS)  ETHANOL                                                                                                                          EKG  EKG Interpretation  Date/Time:    Ventricular Rate:    PR Interval:    QRS Duration:   QT Interval:    QTC Calculation:   R Axis:     Text Interpretation:        Radiology No results found.  Pertinent labs & imaging results that were available during my care of the patient were reviewed by me and considered in my medical decision making (see chart for details).  Medications Ordered in ED Medications  albuterol (PROVENTIL) (2.5 MG/3ML) 0.083% nebulizer solution 2.5 mg (has no  administration in time range)  furosemide (LASIX) tablet 20 mg (has no administration in time range)  gabapentin (NEURONTIN) capsule 200 mg (200 mg Oral Given 06/06/20 0147)  lactulose (CHRONULAC) 10 GM/15ML solution 30 g (has no administration in time range)  levothyroxine (SYNTHROID) tablet 50 mcg (has no administration in time range)  lipase/protease/amylase (CREON) capsule 12,000 Units (has no administration in time range)  potassium chloride SA (KLOR-CON) CR tablet 20 mEq (20 mEq Oral Given 06/06/20 0147)  oxyCODONE (Oxy IR/ROXICODONE) immediate release tablet 10 mg (has no administration in time range)                                                                                                                                    Procedures Procedures  (including critical care time)  Medical Decision Making / ED Course I have reviewed the nursing notes for this encounter and the patient's prior records (if available in EHR or on provided paperwork).   CHAMILLE WERNTZ was evaluated in Emergency Department on 06/06/2020 for the symptoms described in the history of present illness. She was evaluated in the context of the global COVID-19 pandemic, which necessitated consideration that the patient might be at risk for infection with the SARS-CoV-2 virus that causes COVID-19. Institutional protocols and algorithms that pertain to the evaluation of patients at risk for COVID-19 are in a  state of rapid change based on information released by regulatory bodies including the CDC and federal and state organizations. These policies and algorithms were followed during the patient's care in the ED.  Suicidal ideation in the setting of depression with plan to overdose. Patient is alert and oriented x4.  She is cooperative and here voluntarily. Recent admission for hepatic encephalopathy which does not to be affecting her cognition during this presentation. Screening labs are close to the patient's baseline and grossly reassuring. Ammonia level stable.  Patient is medically cleared for behavioral health evaluation and dispo. Ordered patient's home meds. Glucose team management consulted.         Final Clinical Impression(s) / ED Diagnoses Final diagnoses:  Episode of recurrent major depressive disorder, unspecified depression episode severity ([place])  Suicidal ideation      This chart was dictated using voice recognition software.  Despite best efforts to proofread,  errors can occur which can change the documentation meaning.   Fatima Blank, MD 06/06/20 [phone]

## 2020-06-06 NOTE — ED Notes (Signed)
Pt asked for snacks, given 2 graham crackers and 2 peanut butter packets. Will continue to montior pt.

## 2020-06-06 NOTE — ED Notes (Signed)
Pt made a phonecall. Will continue to monitor pt.
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
## 2020-06-06 NOTE — ED Notes (Signed)
ED Provider at bedside. 

## 2020-06-06 NOTE — Progress Notes (Addendum)
Inpatient Diabetes Program Recommendations  AACE/ADA: New Consensus Statement on Inpatient Glycemic Control (2015)  Target Ranges:  Prepandial:   less than 140 mg/dL      Peak postprandial:   less than 180 mg/dL (1-2 hours)      Critically ill patients:  140 - 180 mg/dL   Lab Results  Component Value Date   GLUCAP 96 06/06/2020   HGBA1C 4.7 (L) 03/30/2020     Diabetes history: DM Outpatient Diabetes medications: Insulin pump-Settings from admission in January- Basal insulin  12A-12P-0.5 units/hr 12pm-12am-0.7 units/hr Basal settings were decreased in February by endocrinology due to frequent lows Correction 120-149 mg/dL 3 units  150-199 mg/dL 5  200-249 mg/dL 6  250-299 mg/dL 7  300-349 mg/dL 8     Current orders for Inpatient glycemic control: None  Inpatient Diabetes Program Recommendations:     Insulin pump should be removed and given to family member if patient is having suicidal ideation.  Please consider glycemic control order set-  -Novolog 0-9 units TID and 0-5 QHS -Lantus 6 units daily -Novolog 3 units TID  Will continue to follow while inpatient.  Thank you, Reche Dixon, RN, BSN Diabetes Coordinator Inpatient Diabetes Program [phone] (team pager from 8a-5p)

## 2020-06-06 NOTE — ED Notes (Signed)
Pt given a bible after requesting one. Will continue to monitor pt.

## 2020-06-06 NOTE — ED Notes (Signed)
Pt.s dinner has arrived. Pt sitting up and eating dinner now. Will continue to monitor pt.

## 2020-06-07 DIAGNOSIS — F339 Major depressive disorder, recurrent, unspecified: Secondary | ICD-10-CM | POA: Diagnosis not present

## 2020-06-07 LAB — CBG MONITORING, ED
Glucose-Capillary: 174 mg/dL — ABNORMAL HIGH (ref 70–99)
Glucose-Capillary: 154 mg/dL — ABNORMAL HIGH (ref 70–99)
Glucose-Capillary: 196 mg/dL — ABNORMAL HIGH (ref 70–99)
Glucose-Capillary: 243 mg/dL — ABNORMAL HIGH (ref 70–99)

## 2020-06-07 LAB — HEMOGLOBIN A1C
Hgb A1c MFr Bld: 5.1 % (ref 4.8–5.6)
Mean Plasma Glucose: 99.67 mg/dL

## 2020-06-07 MED ORDER — INSULIN GLARGINE 100 UNIT/ML ~~LOC~~ SOLN
5.0000 [IU] | Freq: Every day | SUBCUTANEOUS | Status: DC
  Administered 2020-06-07 – 2020-06-10 (×4): 5 [IU] via SUBCUTANEOUS
  Filled 2020-06-07 (×5): qty 0.05

## 2020-06-07 MED ORDER — INSULIN ASPART 100 UNIT/ML ~~LOC~~ SOLN
0.0000 [IU] | Freq: Three times a day (TID) | SUBCUTANEOUS | Status: DC
  Administered 2020-06-08: 2 [IU] via SUBCUTANEOUS
  Administered 2020-06-08 (×2): 3 [IU] via SUBCUTANEOUS
  Administered 2020-06-09: 5 [IU] via SUBCUTANEOUS
  Administered 2020-06-09: 7 [IU] via SUBCUTANEOUS
  Administered 2020-06-09: 1 [IU] via SUBCUTANEOUS
  Administered 2020-06-10: 3 [IU] via SUBCUTANEOUS
  Administered 2020-06-10 – 2020-06-11 (×3): 2 [IU] via SUBCUTANEOUS

## 2020-06-07 MED ORDER — RIFAXIMIN 550 MG PO TABS
550.0000 mg | ORAL_TABLET | Freq: Two times a day (BID) | ORAL | Status: DC
  Administered 2020-06-07 – 2020-06-11 (×8): 550 mg via ORAL
  Filled 2020-06-07 (×10): qty 1

## 2020-06-07 MED ORDER — INSULIN ASPART 100 UNIT/ML ~~LOC~~ SOLN
0.0000 [IU] | Freq: Every day | SUBCUTANEOUS | Status: DC
  Administered 2020-06-07 – 2020-06-10 (×2): 2 [IU] via SUBCUTANEOUS

## 2020-06-07 MED ORDER — INSULIN PUMP
Freq: Three times a day (TID) | SUBCUTANEOUS | Status: DC
  Filled 2020-06-07: qty 1

## 2020-06-07 NOTE — Progress Notes (Incomplete)
Per Ophelia Shoulder, NP, patient continues to meet criteria for inpatient treatment. There are no available or appropriate beds at [facility] today. CSW faxed referrals to the following facilities for review:    TTS will continue to seek bed placement.    Crissie Reese, MSW, LCSW-A, LCAS-A Phone: [phone] Disposition/TOC

## 2020-06-07 NOTE — Progress Notes (Incomplete)
Per ***, patient meets criteria for inpatient treatment. There are no available or appropriate beds at [facility] today. CSW faxed referrals to the following facilities for review:    TTS will continue to seek bed placement.    Crissie Reese, MSW, LCSW-A, LCAS-A Phone: [phone] Disposition/TOC

## 2020-06-07 NOTE — ED Notes (Signed)
Pt doesn't like anything on dinner tray and hasn't been eating much from previous meal tray today. Provided pt with a [place] sandwich and gingerale.

## 2020-06-07 NOTE — Progress Notes (Signed)
Per Merlyn Lot, NP, patient meets criteria for inpatient treatment. There are no available or appropriate beds at [facility] today. CSW re-faxed referrals to the following facilities for review:  [place]   TTS will continue to seek bed placement.  Glennie Isle, MSW, [place], LCAS-A Phone: [phone] Disposition/TOC

## 2020-06-07 NOTE — ED Notes (Signed)
Pt states her insulin pump is beeping and that it is time to remove it because it is expired. Diabetes RN previously recommended removal of insulin pump for the safety of the pt. Insulin pump and dexcom removed from pt and placed in patient belongings. Pt signed paperwork relating to belongings. Will continue with CBG monitoring.

## 2020-06-07 NOTE — ED Notes (Signed)
Pt ambulated to bathroom 

## 2020-06-07 NOTE — Discharge Instructions (Signed)
[facility]-will provide timely access to mental health services for children and adolescents (4-17) and adults presenting in a mental health crisis. The program is designed for those who need urgent Behavioral Health or Substance Use treatment and are not experiencing a medical crisis that would typically require an emergency room visit.    [place], [place] [address] Phone: [phone] [email or web address]   [facility] will also offer the following outpatient services: (Monday through Friday 8am-5pm)    Partial Hospitalization Program (PHP)  Substance Abuse Intensive Outpatient Program (SA-IOP)  Group Therapy  Medication Management  Peer Living Room   We also provide (24/7):    Assessments: Our mental health clinician and providers will conduct a focused mental health evaluation, assessing for immediate safety concerns and further mental health needs.   Referral: Our team will provide resources and help connect to community based mental health treatment, when indicated, including psychotherapy, psychiatry, and other specialized behavioral health or substance use disorder services (for those not already in treatment).   Transitional Care: Our team providers in person bridging and/or telphonic follow-up during the patient's transition to outpatient services.

## 2020-06-07 NOTE — BH Assessment (Addendum)
06/07/20 TTS Reassessment:   Patient states that she presented to the [facility] Emergency Department because she wanted to "give up". She was transported to the Emergency Department by her significant other. States that in December 2022, she was admitted to Intensive Care. She is unsure of how long she was in the hospital. She guestimate's being in the Intensive Care approximately 8 days. Patient reports not remembering a lot of event that occurred while she was in the hospital because she was sick. States that she is a DNR and the hospital went against her wishes to keep her alive. She was not expected to live long after being admitted to ICU. However, her health improved and she was released home. Patient verbalized her frustrations throughout today's assessment with the hospital not allowing her to die.   Since her return home she hasn't been able to sleep unless someone takes her for a ride in the car. Her anxiety is severe and she reports having 2-3 panic attacks per day. The anxiety makes her tearful. Patient avoids being around large crowds of people this intensifies her anxiety.  States that a lot of people ask her if she is afraid of dying and she reports having fear. She has accepted having End Stage Liver Disease and "I'm dealing with my reality".   She acknowledges upon arrival to the ED she had suicidal ideations. Today, she continues to endorse suicidal ideations. States, "If I go home I'll kill myself". Patient says that she doesn't ask for help often but she would like help with her anxiety. She is unable to report any protective factors that would prevent her from committing suicide.  Denies HI and AVH's. Denies alcohol and drug use.  Disposition: Per Merlyn Lot, NP, continues to recommend INPT treatment. Disposition LCSW to continue seeking appropriate placement.

## 2020-06-07 NOTE — ED Notes (Signed)
Pt given lunch tray.

## 2020-06-07 NOTE — ED Notes (Signed)
TTS talking to pt at this time.

## 2020-06-07 NOTE — Progress Notes (Addendum)
Inpatient Diabetes Program Recommendations  AACE/ADA: New Consensus Statement on Inpatient Glycemic Control (2015)  Target Ranges:  Prepandial:   less than 140 mg/dL      Peak postprandial:   less than 180 mg/dL (1-2 hours)      Critically ill patients:  140 - 180 mg/dL   Lab Results  Component Value Date   GLUCAP 154 (H) 06/07/2020   HGBA1C 4.7 (L) 03/30/2020    Review of Glycemic Control Results for [name], [name] (MRN [id number]) as of 06/07/2020 14:14  Ref. Range 06/06/2020 21:59 06/07/2020 09:59 06/07/2020 12:31  Glucose-Capillary Latest Ref Range: 70 - 99 mg/dL 150 (H) 174 (H) 154 (H)   Diabetes history: DM2 Outpatient Diabetes medications:  Omnipod DASH/Dexcom/Novolog Basal insulin 12A-12P-0.5 units/hr 12pm-12am-0.7 units/hr Basal settings were decreased in February by endocrinology due to frequent lows Correction 120-149 mg/dL 3 units  150-199 mg/dL 5  200-249 mg/dL 6  250-299 mg/dL 7  300-349 mg/dL 8  Current orders for Inpatient glycemic control: Insulin pump  Inpatient Diabetes Program Recommendations:     Spoke with sarah, RN.  She states patients transmitter that controls her insulin administration is locked with security.   Please remove pump and Dexcom and lock up with pump transmitter.   Start SQ insulin:  Lantus 5 units daily (to start 1 hour prior to removing pump) Novolog 0-9 units TID and 0-5 units QHS  Also spoke with patient.  She agrees with above plan.  Unsure if transmitter is delivering basal insulin at this point as it has been out of patients possession.     Will continue to follow while inpatient.  Thank you, Reche Dixon, RN, BSN Diabetes Coordinator Inpatient Diabetes Program [phone] (team pager from 8a-5p)

## 2020-06-07 NOTE — ED Notes (Signed)
Pt refused to take Prozac. She states, " I don't like the way it makes me feel and I don't like the doctor that prescribes it."

## 2020-06-07 NOTE — ED Notes (Signed)
Pt provided with dinner tray.

## 2020-06-08 ENCOUNTER — Encounter (HOSPITAL_COMMUNITY): Payer: Self-pay | Admitting: *Deleted

## 2020-06-08 DIAGNOSIS — F339 Major depressive disorder, recurrent, unspecified: Secondary | ICD-10-CM | POA: Diagnosis not present

## 2020-06-08 LAB — CBG MONITORING, ED
Glucose-Capillary: 166 mg/dL — ABNORMAL HIGH (ref 70–99)
Glucose-Capillary: 206 mg/dL — ABNORMAL HIGH (ref 70–99)
Glucose-Capillary: 216 mg/dL — ABNORMAL HIGH (ref 70–99)
Glucose-Capillary: 159 mg/dL — ABNORMAL HIGH (ref 70–99)

## 2020-06-08 NOTE — Progress Notes (Signed)
Patient information has been sent to [facility] via secure chat to review for potential admission. Patient meets inpatient criteria per Merlyn Lot, NP.   [place] was unable to accept patient at this time, patient's labs are not stable. CSW will continue to monitor patient disposition.    Signed:  Durenda Hurt, MSW, [place], LCASA 06/08/2020 1:29 PM

## 2020-06-08 NOTE — ED Notes (Signed)
Patient was given , Cheese sticks, Catheryn Bacon, and Peanut Butter w/ a Cup of Ginger Ale.

## 2020-06-08 NOTE — Progress Notes (Signed)
Inpatient Diabetes Program Recommendations  AACE/ADA: New Consensus Statement on Inpatient Glycemic Control (2015)  Target Ranges:  Prepandial:   less than 140 mg/dL      Peak postprandial:   less than 180 mg/dL (1-2 hours)      Critically ill patients:  140 - 180 mg/dL   Results for ACACIA, LATORRE (MRN [id number]) as of 06/08/2020 13:47  Ref. Range 06/07/2020 09:59 06/07/2020 12:31 06/07/2020 18:09 06/07/2020 21:43  Glucose-Capillary Latest Ref Range: 70 - 99 mg/dL 662 (H) 947 (H) 654 (H) 243 (H)  2 units NOVOLOG  5 units LANTUS   Results for AILIS, RIGAUD (MRN [id number]) as of 06/08/2020 13:47  Ref. Range 06/08/2020 07:41 06/08/2020 12:44  Glucose-Capillary Latest Ref Range: 70 - 99 mg/dL 812 (H)  2 units NOVOLOG  206 (H)  3 units NOVOLOG     Diabetes history: DM2  Outpatient Diabetes medications:  Omnipod DASH/Dexcom/Novolog Basal insulin 12A-12P-0.5 units/hr 12pm-12am-0.7 units/hr Basal settings were decreased in February by endocrinology due to frequent lows Correction 120-149 mg/dL 3 units  751-700 mg/dL 5  174-944 mg/dL 6  967-591 mg/dL 7  638-466 mg/dL 8   Current Hospital Orders: Lantus 5 units QHS            Novolog Sensitive Correction Scale/ SSI (0-9 units) TID AC + HS    MD- Note Lantus and Novolog SSI started last PM after pt removed home Insulin Pump.  CBGs so far today: 166-206  Please consider:  1. Increase Lantus slightly to 7 units QHS  2. Start low dose Novolog Meal Coverage: Novolog 2 units TID with meals Hold if pt eats <50% of meal, Hold if pt NPO     --Will follow patient during hospitalization--  Ambrose Finland RN, MSN, CDE Diabetes Coordinator Inpatient Glycemic Control Team Team Pager: [phone] (8a-5p)

## 2020-06-08 NOTE — ED Notes (Signed)
Patient refused Lunch Tray, But was given a [place] Sandwich Bag, and Drink.

## 2020-06-09 DIAGNOSIS — F339 Major depressive disorder, recurrent, unspecified: Secondary | ICD-10-CM | POA: Diagnosis not present

## 2020-06-09 LAB — CBG MONITORING, ED
Glucose-Capillary: 142 mg/dL — ABNORMAL HIGH (ref 70–99)
Glucose-Capillary: 282 mg/dL — ABNORMAL HIGH (ref 70–99)
Glucose-Capillary: 306 mg/dL — ABNORMAL HIGH (ref 70–99)
Glucose-Capillary: 175 mg/dL — ABNORMAL HIGH (ref 70–99)

## 2020-06-09 NOTE — ED Notes (Signed)
Pt currently standing at the desk making her 1st phone call for the day. Pt talking to family giving an update and checking on her "babies". Will continue to monitor.

## 2020-06-09 NOTE — BH Assessment (Signed)
TTS discontinued consult in error. This counselor put in another to consult to keep her on our list

## 2020-06-09 NOTE — Progress Notes (Addendum)
Inpatient Diabetes Program Recommendations  AACE/ADA: New Consensus Statement on Inpatient Glycemic Control (2015)  Target Ranges:  Prepandial:   less than 140 mg/dL      Peak postprandial:   less than 180 mg/dL (1-2 hours)      Critically ill patients:  140 - 180 mg/dL   Results for [name], [name] (MRN [id number]) as of 06/09/2020 12:22  Ref. Range 06/08/2020 07:41 06/08/2020 12:44 06/08/2020 17:46 06/08/2020 21:34  Glucose-Capillary Latest Ref Range: 70 - 99 mg/dL 166 (H)  2 units NOVOLOG  206 (H)  3 units NOVOLOG  216 (H)  3 units NOVOLOG  159 (H)    5 units LANTUS   Results for [name], [name] (MRN [id number]) as of 06/09/2020 12:22  Ref. Range 06/09/2020 08:04 06/09/2020 12:07  Glucose-Capillary Latest Ref Range: 70 - 99 mg/dL 142 (H)  1 units NOVOLOG  282 (H)     Diabetes history:DM2  Outpatient Diabetes medications:Omnipod DASH/Dexcom/Novolog Basal insulin 12A-12P-0.5 units/hr 12pm-12am-0.7 units/hr Basal settings were decreased in February by endocrinology due to frequent lows Correction 120-149 mg/dL 3 units  150-199 mg/dL 5  200-249 mg/dL 6  250-299 mg/dL 7  300-349 mg/dL 8   Current Hospital Orders: Lantus 5 units QHS                                          Novolog Sensitive Correction Scale/ SSI (0-9 units) TID AC + HS    MD- Please consider:  Start low dose Novolog Meal Coverage: Novolog 2 units TID with meals  Hold if pt eats <50% of meal, Hold if pt NPO     --Will follow patient during hospitalization--  Wyn Quaker RN, MSN, CDE Diabetes Coordinator Inpatient Glycemic Control Team Team Pager: [phone] (8a-5p)

## 2020-06-10 DIAGNOSIS — F339 Major depressive disorder, recurrent, unspecified: Secondary | ICD-10-CM | POA: Diagnosis not present

## 2020-06-10 LAB — CBG MONITORING, ED
Glucose-Capillary: 154 mg/dL — ABNORMAL HIGH (ref 70–99)
Glucose-Capillary: 267 mg/dL — ABNORMAL HIGH (ref 70–99)
Glucose-Capillary: 219 mg/dL — ABNORMAL HIGH (ref 70–99)
Glucose-Capillary: 170 mg/dL — ABNORMAL HIGH (ref 70–99)
Glucose-Capillary: 240 mg/dL — ABNORMAL HIGH (ref 70–99)

## 2020-06-10 NOTE — ED Provider Notes (Signed)
Emergency Medicine Observation Re-evaluation Note  [name] is a 55 y.o. female, seen on rounds today.  Pt initially presented to the ED for complaints of Suicidal Currently, the patient is sleeping comfortably no acute distress.  Physical Exam  BP (!) 120/57 (BP Location: Left Arm)   Pulse 78   Temp 99.1 F (37.3 C) (Oral)   Resp 16   Ht 5' 3"  (1.6 m)   Wt 58 kg   SpO2 92%   BMI 22.65 kg/m  Physical Exam General: No acute distress, sleeping comfortably Cardiac: Normal rate Lungs: No respiratory distress   ED Course / MDM  EKG:   I have reviewed the labs performed to date as well as medications administered while in observation.  Recent changes in the last 24 hours include none.  Plan  Current plan is for inpatient psychiatric admission per Merlyn Lot NP 3/20.  Per chart review patient unable to be accepted at [facility] due to labs not stable on 3/21.  CSW will continue to monitor patient disposition.  Diabetes coordinator has been consulted to assist with patient's diabetes and hypoglycemia.  Glucose obtained at 0738 was 154.  CSW continue looking for placement.  Patient is not under full IVC at this time.   Loni Beckwith, PA-C 06/10/20 1311    Valarie Merino, MD 06/10/20 [phone]

## 2020-06-10 NOTE — Progress Notes (Addendum)
ADDENDUM  [facility] unable to accept patient, no bed availability at this time. CSW will pursue other facilities.   Signed:  Durenda Hurt, MSW, [place], LCASA 06/10/2020 11:35 AM  Patient information has been sent to TTS at [facility] via secure chat to review for potential admission. Patient meets inpatient criteria per Merlyn Lot, NP.   Situation ongoing, CSW will continue to monitor progress.    Signed:  Durenda Hurt, MSW, [place], LCASA 06/10/2020 11:05 AM

## 2020-06-10 NOTE — ED Notes (Signed)
Meal tray delivered.

## 2020-06-10 NOTE — BH Assessment (Addendum)
Patient continues to meet criteria for inpatient treatment. [facility] unable to accept patient, no bed availability at this time. Disposition Counselor will pursue other facilities. Re-Faxed referrals to the following facilities:   [facility] Details  [facility] Details  [place] Details  [facility] Details  [facility] Details  [facility] Details [facility] Details  [facility] Details  [facility] Details  [facility] Details [place] Details  [facility] Details  [place] Details  [facility] Details  [facility] Details  [facility] Details  [place]

## 2020-06-10 NOTE — Progress Notes (Addendum)
Inpatient Diabetes Program Recommendations  AACE/ADA: New Consensus Statement on Inpatient Glycemic Control (2015)  Target Ranges:  Prepandial:   less than 140 mg/dL      Peak postprandial:   less than 180 mg/dL (1-2 hours)      Critically ill patients:  140 - 180 mg/dL   Lab Results  Component Value Date   GLUCAP 154 (H) 06/10/2020   HGBA1C 5.1 06/07/2020    Review of Glycemic Control Results for [name], [name] (MRN [id number]) as of 06/10/2020 11:34  Ref. Range 06/09/2020 08:04 06/09/2020 12:07 06/09/2020 17:02 06/09/2020 21:21 06/10/2020 07:38  Glucose-Capillary Latest Ref Range: 70 - 99 mg/dL 142 (H) 282 (H) 306 (H) 175 (H) 154 (H)    Inpatient Diabetes Program Recommendations:     Post prandials elevated.  Please consider;  Novolog 3 units TID with meals if eats at least 50% & changing diet to carb modified.  Will continue to follow while inpatient.  Thank you, Reche Dixon, RN, BSN Diabetes Coordinator Inpatient Diabetes Program [phone] (team pager from 8a-5p)

## 2020-06-10 NOTE — ED Notes (Signed)
Pt sitting on the end of bed clapping his hands. Security still sitting with PT.

## 2020-06-11 DIAGNOSIS — F333 Major depressive disorder, recurrent, severe with psychotic symptoms: Secondary | ICD-10-CM | POA: Diagnosis not present

## 2020-06-11 DIAGNOSIS — F339 Major depressive disorder, recurrent, unspecified: Secondary | ICD-10-CM | POA: Diagnosis not present

## 2020-06-11 LAB — CBG MONITORING, ED: Glucose-Capillary: 160 mg/dL — ABNORMAL HIGH (ref 70–99)

## 2020-06-11 MED ORDER — INSULIN ASPART 100 UNIT/ML ~~LOC~~ SOLN: 2.0000 [IU] | Freq: Three times a day (TID) | SUBCUTANEOUS | Status: DC

## 2020-06-11 NOTE — Progress Notes (Signed)
Inpatient Diabetes Program Recommendations  AACE/ADA: New Consensus Statement on Inpatient Glycemic Control  Target Ranges:  Prepandial:   less than 140 mg/dL      Peak postprandial:   less than 180 mg/dL (1-2 hours)      Critically ill patients:  140 - 180 mg/dL  Results for [name], [name] (MRN [id number]) as of 06/11/2020 07:48  Ref. Range 06/10/2020 07:38 06/10/2020 11:48 06/10/2020 12:49 06/10/2020 17:52 06/10/2020 21:13  Glucose-Capillary Latest Ref Range: 70 - 99 mg/dL 154 (H) 267 (H) 219 (H) 170 (H) 240 (H)    Review of Glycemic Control Diabetes history:DM2  Outpatient Diabetes medications:Omnipod DASH/Dexcom/Novolog Basal insulin 12A-12P-0.5 units/hr 12pm-12am-0.7 units/hr Basal settings were decreased in February by endocrinology due to frequent lows Correction 120-149 mg/dL 3 units  150-199 mg/dL 5  200-249 mg/dL 6  250-299 mg/dL 7  300-349 mg/dL 8   Current orders for Inpatient glycemic control: Lantus 5 units QHS, Novolog 0-9 units TID, Novolog 0-5 units QHS  Inpatient Diabetes Program Recommendations:    Insulin: Please consider ordering Novolog 2 units TID with meals for meal coverage if patient eats at least 50% of meals.  Thanks, Barnie Alderman, RN, MSN, CDE Diabetes Coordinator Inpatient Diabetes Program [phone] (Team Pager from 8am to 5pm)

## 2020-06-11 NOTE — ED Notes (Signed)
All personal belongings from locker and valuables envelope returned to patient. Patient dressed in personal clothing. Refused CBG check, insulin and CREON stating she would be using her insulin pump and take these medications at home.

## 2020-06-11 NOTE — Consult Note (Signed)
Telepsych Consultation   Reason for Consult: Psychiatry provider reassessment Referring Physician:  Dr Rubin Payor Location of Patient: [facility] emergency department Location of Provider: [facility]TS Department  Patient Identification: [name] MRN:  [id number] Principal Diagnosis: Severe recurrent major depressive disorder with psychotic features [facility]) Diagnosis:  Principal Problem:   Severe recurrent major depressive disorder with psychotic features (HCC)   Total Time spent with patient: 30 minutes  Subjective:   [name] is a 55 y.o. female patient.  Patient states "I am ready to go home today, I would like to follow-up with outpatient."  HPI:   Patient reports she came to the hospital because she had "been through a lot in the last couple of months, and I got to where I wanted to give up so wanted to talk to a psychiatrist and maybe a therapist to figure out what I can do to help myself."  Patient reports she has been diagnosed with depression as well as schizoaffective disorder.  [name] has been seen by outpatient at [facility] in [place] in the past, last seen in December 2021.  She reports she does not like the doctor and does not want to return to this provider.  She is willing to see a provider in [place] if no provider closer to her home can be located.  Patient reports readiness to discharge home.  Patient assessed by nurse practitioner.  Patient alert and oriented, answers appropriately.  [name] denies suicidal and homicidal ideations.  She reports history of "a lot" of suicide attempts, last attempt "has been a long time."  She states "I am pretty good at coming to get help."  Tava reports history of auditory and visual hallucinations, denies hallucinations currently.  There is no evidence of delusional thought content and no indication that patient is responding to internal stimuli.  She denies symptoms of paranoia.  [name] resides in [place], [place] with her significant other.  She denies access to weapons.  She reports she is disabled related to "psychiatric concerns."  She reports she enjoys speaking with her neighbor who is supportive as her significant other works night shift and sleeps during the day.  She endorses average sleep and appetite.  Patient offered support and encouragement.  She gives verbal consent to speak with significant other, Junious Silk phone number [phone].  Spoke with patient's significant other Sam, who denies concerns for patient safety.  He reports he will pick up patient from emergency department today around noon.  Sam also reports he will assist patient with transportation to outpatient psychiatry appointments.  Past Psychiatric History: Depression, schizoaffective disorder, alcohol use disorder  Risk to Self:  Denies Risk to Others:  Denies Prior Inpatient Therapy:   [facility] 2021 Prior Outpatient Therapy:  Most recently followed by [facility]  Past Medical History:  Past Medical History:  Diagnosis Date  . Degenerative joint disease   . Diabetes mellitus without complication (HCC)   . Gastroesophageal reflux   . Pancreatitis   . Panic attack   . Sleep apnea   . Thrombocytopenia (HCC)     Past Surgical History:  Procedure Laterality Date  . CHOLECYSTECTOMY    . SHOULDER SURGERY     left   . tongue sx. titanium screw placed to hold tongue down    . TOTAL THYROIDECTOMY     Family History: No family history on file. Family Psychiatric  History: None reported Social History:  Social History   Substance and Sexual  Activity  Alcohol Use No     Social History   Substance and Sexual Activity  Drug Use No    Social History   Socioeconomic History  . Marital status: Single    Spouse name: Not on file  . Number of children: Not on file  . Years of education: Not on file  . Highest education level: Not on file  Occupational History  . Not on file   Tobacco Use  . Smoking status: Never Smoker  . Smokeless tobacco: Never Used  Vaping Use  . Vaping Use: Never used  Substance and Sexual Activity  . Alcohol use: No  . Drug use: No  . Sexual activity: Not Currently  Other Topics Concern  . Not on file  Social History Narrative  . Not on file   Social Determinants of Health   Financial Resource Strain: Not on file  Food Insecurity: Not on file  Transportation Needs: Not on file  Physical Activity: Not on file  Stress: Not on file  Social Connections: Not on file   Additional Social History:    Allergies:   Allergies  Allergen Reactions  . Acetaminophen Hives and Other (See Comments)    Tolerates Fioricet  . Cephalexin Itching, Swelling and Other (See Comments)    Tongue swells and makes throat itchy- can tolerate Zosyn  . Ciprofloxacin Hives  . Hydromorphone Hives  . Sulfamethoxazole-Trimethoprim Shortness Of Breath, Swelling and Other (See Comments)    Tongue swells   . Abilify [Aripiprazole]     "I did noyt like the way this made me feel."    Labs:  Results for orders placed or performed during the hospital encounter of 06/05/20 (from the past 48 hour(s))  POC CBG, ED     Status: Abnormal   Collection Time: 06/09/20 12:07 PM  Result Value Ref Range   Glucose-Capillary 282 (H) 70 - 99 mg/dL    Comment: Glucose reference range applies only to samples taken after fasting for at least 8 hours.  POC CBG, ED     Status: Abnormal   Collection Time: 06/09/20  5:02 PM  Result Value Ref Range   Glucose-Capillary 306 (H) 70 - 99 mg/dL    Comment: Glucose reference range applies only to samples taken after fasting for at least 8 hours.   Comment 1 Document in Chart   POC CBG, ED     Status: Abnormal   Collection Time: 06/09/20  9:21 PM  Result Value Ref Range   Glucose-Capillary 175 (H) 70 - 99 mg/dL    Comment: Glucose reference range applies only to samples taken after fasting for at least 8 hours.   Comment 1 T    POC CBG, ED     Status: Abnormal   Collection Time: 06/10/20  7:38 AM  Result Value Ref Range   Glucose-Capillary 154 (H) 70 - 99 mg/dL    Comment: Glucose reference range applies only to samples taken after fasting for at least 8 hours.  POC CBG, ED     Status: Abnormal   Collection Time: 06/10/20 11:48 AM  Result Value Ref Range   Glucose-Capillary 267 (H) 70 - 99 mg/dL    Comment: Glucose reference range applies only to samples taken after fasting for at least 8 hours.  POC CBG, ED     Status: Abnormal   Collection Time: 06/10/20 12:49 PM  Result Value Ref Range   Glucose-Capillary 219 (H) 70 - 99 mg/dL    Comment: Glucose  reference range applies only to samples taken after fasting for at least 8 hours.   Comment 1 Notify RN    Comment 2 Document in Chart   POC CBG, ED     Status: Abnormal   Collection Time: 06/10/20  5:52 PM  Result Value Ref Range   Glucose-Capillary 170 (H) 70 - 99 mg/dL    Comment: Glucose reference range applies only to samples taken after fasting for at least 8 hours.  POC CBG, ED     Status: Abnormal   Collection Time: 06/10/20  9:13 PM  Result Value Ref Range   Glucose-Capillary 240 (H) 70 - 99 mg/dL    Comment: Glucose reference range applies only to samples taken after fasting for at least 8 hours.   Comment 1 Notify RN   POC CBG, ED     Status: Abnormal   Collection Time: 06/11/20  7:46 AM  Result Value Ref Range   Glucose-Capillary 160 (H) 70 - 99 mg/dL    Comment: Glucose reference range applies only to samples taken after fasting for at least 8 hours.    Medications:  Current Facility-Administered Medications  Medication Dose Route Frequency Provider Last Rate Last Admin  . albuterol (PROVENTIL) (2.5 MG/3ML) 0.083% nebulizer solution 2.5 mg  2.5 mg Inhalation Q6H PRN Cardama, Amadeo GarnetPedro Eduardo, MD      . FLUoxetine (PROZAC) capsule 10 mg  10 mg Oral Daily Ophelia ShoulderMills, Shnese E, NP   10 mg at 06/09/20 0950  . furosemide (LASIX) tablet 20 mg  20 mg Oral  BID Nira Connardama, Pedro Eduardo, MD   20 mg at 06/11/20 0747  . gabapentin (NEURONTIN) capsule 200 mg  200 mg Oral BID Nira Connardama, Pedro Eduardo, MD   200 mg at 06/10/20 2211  . insulin aspart (novoLOG) injection 0-5 Units  0-5 Units Subcutaneous QHS Arby BarrettePfeiffer, Marcy, MD   2 Units at 06/10/20 2213  . insulin aspart (novoLOG) injection 0-9 Units  0-9 Units Subcutaneous TID WC Arby BarrettePfeiffer, Marcy, MD   2 Units at 06/11/20 0750  . insulin aspart (novoLOG) injection 2 Units  2 Units Subcutaneous TID WC Benjiman CorePickering, Nathan, MD      . insulin glargine (LANTUS) injection 5 Units  5 Units Subcutaneous QHS Arby BarrettePfeiffer, Marcy, MD   5 Units at 06/10/20 2212  . lactulose (CHRONULAC) 10 GM/15ML solution 30 g  30 g Oral TID Nira Connardama, Pedro Eduardo, MD   30 g at 06/10/20 2212  . levothyroxine (SYNTHROID) tablet 50 mcg  50 mcg Oral QAC breakfast Cardama, Amadeo GarnetPedro Eduardo, MD   50 mcg at 06/11/20 0747  . lipase/protease/amylase (CREON) capsule 12,000 Units  12,000 Units Oral TID AC Nira Connardama, Pedro Eduardo, MD   12,000 Units at 06/11/20 0800  . oxyCODONE (Oxy IR/ROXICODONE) immediate release tablet 10 mg  10 mg Oral Q6H PRN Nira Connardama, Pedro Eduardo, MD   10 mg at 06/10/20 2211  . potassium chloride SA (KLOR-CON) CR tablet 20 mEq  20 mEq Oral BID Nira Connardama, Pedro Eduardo, MD   20 mEq at 06/10/20 2211  . rifaximin (XIFAXAN) tablet 550 mg  550 mg Oral BID Arby BarrettePfeiffer, Marcy, MD   550 mg at 06/11/20 0800   Current Outpatient Medications  Medication Sig Dispense Refill  . albuterol (PROVENTIL) (2.5 MG/3ML) 0.083% nebulizer solution Inhale 2.5 mg into the lungs every 6 (six) hours as needed for wheezing or shortness of breath.     Marland Kitchen. albuterol (VENTOLIN HFA) 108 (90 Base) MCG/ACT inhaler Inhale 2 puffs into the lungs every 4 (four)  hours as needed for wheezing or shortness of breath. 18 g 2  . Continuous Blood Gluc Sensor (DEXCOM G6 SENSOR) MISC Inject into the skin See admin instructions. Place a new sensor under the skin every 10 days    . CREON  24000-76000 units CPEP Take 1 capsule by mouth 3 (three) times daily before meals.    . diphenhydrAMINE (BENADRYL) 25 MG tablet Take 25 mg by mouth every 6 (six) hours as needed for allergies or itching.    . furosemide (LASIX) 20 MG tablet Take 20 mg by mouth in the morning and at bedtime.    . gabapentin (NEURONTIN) 100 MG capsule Take 2 capsules (200 mg total) by mouth 2 (two) times daily. For agitation/neuropathy (Patient taking differently: Take 200 mg by mouth 3 (three) times daily.)    . insulin aspart (NOVOLOG) 100 UNIT/ML injection Inject 10 Units into the skin 3 (three) times daily with meals. For diabetes management (Patient taking differently: Inject into the skin See admin instructions. Via Insulin Pump) 10 mL 0  . Insulin Human (INSULIN PUMP) SOLN Inject into the skin continuous.    Marland Kitchen lactulose (CHRONULAC) 10 GM/15ML solution Take 45 mLs (30 g total) by mouth 3 (three) times daily. (Patient taking differently: Take 30 g by mouth 3 (three) times daily. Take 30 grams (45 ml's) by mouth three to five times a day until 3 bowel movements a day are achieved, then as directed) 946 mL 4  . levothyroxine (SYNTHROID, LEVOTHROID) 50 MCG tablet Take 50 mcg by mouth daily before breakfast.     . lipase/protease/amylase (CREON) 12000-38000 units CPEP capsule Take 1 capsule (12,000 Units total) by mouth 3 (three) times daily before meals. (Patient taking differently: No sig reported) 270 capsule 4  . NITROGLYCERIN PO Place 1 tablet under the tongue every 5 (five) minutes as needed (as directed, for chest pain).    . ondansetron (ZOFRAN ODT) 4 MG disintegrating tablet Take 1 tablet (4 mg total) by mouth every 8 (eight) hours as needed. (Patient taking differently: Take 4 mg by mouth See admin instructions. Dissolve 4 mg orally one to two times a day) 8 tablet 0  . Oxycodone HCl 10 MG TABS Take 10 mg by mouth every 6 (six) hours.    . OXYGEN Inhale 2-4 L/min into the lungs as needed (for shortness of  breath).    . potassium chloride SA (KLOR-CON) 20 MEQ tablet Take 1 tablet (20 mEq total) by mouth 2 (two) times daily. 60 tablet 3  . megestrol (MEGACE) 40 MG tablet Take 1 tablet (40 mg total) by mouth daily. For Cachexia (Patient not taking: No sig reported) 15 tablet 0  . mirtazapine (REMERON) 15 MG tablet Take 1 tablet (15 mg total) by mouth at bedtime. For depression (Patient not taking: Reported on 06/06/2020) 30 tablet 0  . XIFAXAN 550 MG TABS tablet Take 550 mg by mouth 2 (two) times daily.      Musculoskeletal: Strength & Muscle Tone: within normal limits Gait & Station: normal Patient leans: N/A  Psychiatric Specialty Exam: Physical Exam Vitals and nursing note reviewed.  Constitutional:      Appearance: She is well-developed.  HENT:     Head: Normocephalic.  Cardiovascular:     Rate and Rhythm: Normal rate.  Pulmonary:     Effort: Pulmonary effort is normal.  Neurological:     Mental Status: She is alert and oriented to person, place, and time.  Psychiatric:  Attention and Perception: Attention and perception normal.        Mood and Affect: Mood and affect normal.        Speech: Speech normal.        Behavior: Behavior normal. Behavior is cooperative.     Review of Systems  Blood pressure (!) 99/46, pulse 64, temperature 98.8 F (37.1 C), temperature source Oral, resp. rate 17, height 5\' 3"  (1.6 m), weight 58 kg, SpO2 96 %.Body mass index is 22.65 kg/m.  General Appearance: Casual and Fairly Groomed  Eye Contact:  Good  Speech:  Clear and Coherent and Normal Rate  Volume:  Normal  Mood:  Depressed  Affect:  Appropriate and Congruent  Thought Process:  Coherent, Goal Directed and Descriptions of Associations: Intact  Orientation:  Full (Time, Place, and Person)  Thought Content:  WDL and Logical  Suicidal Thoughts:  No  Homicidal Thoughts:  No  Memory:  Immediate;   Good Recent;   Good Remote;   Good  Judgement:  Good  Insight:  Good  Psychomotor  Activity:  Normal  Concentration:  Concentration: Good and Attention Span: Good  Recall:  Good  Fund of Knowledge:  Good  Language:  Good  Akathisia:  No  Handed:  Right  AIMS (if indicated):     Assets:  Communication Skills Desire for Improvement Financial Resources/Insurance Housing Intimacy Leisure Time Physical Health Resilience Social Support  ADL's:  Intact  Cognition:  WNL  Sleep:        Treatment Plan Summary: Plan Patient reviewed with Dr. .  Follow-up with outpatient psychiatry, resources provided. Continue current medications including  -Prozac 10 mg daily -Gabapentin 200 mg twice daily  Disposition: No evidence of imminent risk to self or others at present.   Patient does not meet criteria for psychiatric inpatient admission. Supportive therapy provided about ongoing stressors. Discussed crisis plan, support from social network, calling 911, coming to the Emergency Department, and calling Suicide Hotline.  This service was provided via telemedicine using a 2-way, interactive audio and video technology.  Names of all persons participating in this telemedicine service and their role in this encounter. Name: [name] Role: Patient  Name: Zella Richer telephone Role: Patient significant other  Name: Sula Soda Role: FNP  Name: Dr. Berneice Heinrich Role: Psychiatry    Bronwen Betters, FNP 06/11/2020 10:06 AM

## 2020-06-11 NOTE — Progress Notes (Signed)
Pt was re-faxed out to the following facilities due to no appropriate beds at [facility].  [facility] Details  [facility] Details  [place] Details  [facility] Details  [facility] Details  [facility] Details [facility] Details  [facility] Details  [facility] Details  [facility] Details [place] Details  [facility] Details  [place] Details  [facility] Details  [facility] Details  [facility] Details  [place]  Situation ongoing CSW will follow.

## 2020-06-11 NOTE — ED Provider Notes (Addendum)
  Physical Exam  BP (!) 99/46 (BP Location: Left Arm)   Pulse 64   Temp 98.8 F (37.1 C) (Oral)   Resp 17   Ht 5' 3"  (1.6 m)   Wt 58 kg   SpO2 96%   BMI 22.65 kg/m   Physical Exam  ED Course/Procedures     Procedures  MDM  Patient still pending placement.  Sugar somewhat better controlled.  Followed up on diabetic management and diabetic team as requested adding on NovoLog 2 units 3 times daily with meals as long as she eats 50%.  This has been added.   Patient has now been seen by psychiatry and cleared for discharge.  Will discharge around noon.       Davonna Belling, MD 06/11/20 8307    Davonna Belling, MD 06/11/20 1021

## 2020-07-19 ENCOUNTER — Emergency Department (HOSPITAL_COMMUNITY): Payer: Medicare HMO

## 2020-07-19 ENCOUNTER — Encounter (HOSPITAL_COMMUNITY): Payer: Self-pay

## 2020-07-19 ENCOUNTER — Inpatient Hospital Stay (HOSPITAL_COMMUNITY)
Admission: EM | Admit: 2020-07-19 | Discharge: 2020-07-31 | DRG: 871 | Disposition: A | Discharge status: Home Health | Payer: Medicare HMO | Attending: Family Medicine | Admitting: Family Medicine

## 2020-07-19 ENCOUNTER — Other Ambulatory Visit: Payer: Self-pay

## 2020-07-19 DIAGNOSIS — K7581 Nonalcoholic steatohepatitis (NASH): Secondary | ICD-10-CM | POA: Diagnosis present

## 2020-07-19 DIAGNOSIS — Z9981 Dependence on supplemental oxygen: Secondary | ICD-10-CM | POA: Diagnosis not present

## 2020-07-19 DIAGNOSIS — R188 Other ascites: Secondary | ICD-10-CM | POA: Diagnosis not present

## 2020-07-19 DIAGNOSIS — R0902 Hypoxemia: Secondary | ICD-10-CM

## 2020-07-19 DIAGNOSIS — E876 Hypokalemia: Secondary | ICD-10-CM | POA: Diagnosis not present

## 2020-07-19 DIAGNOSIS — Z79899 Other long term (current) drug therapy: Secondary | ICD-10-CM

## 2020-07-19 DIAGNOSIS — D696 Thrombocytopenia, unspecified: Secondary | ICD-10-CM | POA: Diagnosis present

## 2020-07-19 DIAGNOSIS — G9340 Encephalopathy, unspecified: Secondary | ICD-10-CM | POA: Diagnosis not present

## 2020-07-19 DIAGNOSIS — E890 Postprocedural hypothyroidism: Secondary | ICD-10-CM | POA: Diagnosis present

## 2020-07-19 DIAGNOSIS — E039 Hypothyroidism, unspecified: Secondary | ICD-10-CM

## 2020-07-19 DIAGNOSIS — Z79890 Hormone replacement therapy (postmenopausal): Secondary | ICD-10-CM | POA: Diagnosis not present

## 2020-07-19 DIAGNOSIS — Z781 Physical restraint status: Secondary | ICD-10-CM

## 2020-07-19 DIAGNOSIS — K219 Gastro-esophageal reflux disease without esophagitis: Secondary | ICD-10-CM | POA: Diagnosis present

## 2020-07-19 DIAGNOSIS — Z794 Long term (current) use of insulin: Secondary | ICD-10-CM | POA: Diagnosis not present

## 2020-07-19 DIAGNOSIS — K7460 Unspecified cirrhosis of liver: Secondary | ICD-10-CM | POA: Diagnosis not present

## 2020-07-19 DIAGNOSIS — Z66 Do not resuscitate: Secondary | ICD-10-CM | POA: Diagnosis present

## 2020-07-19 DIAGNOSIS — D6959 Other secondary thrombocytopenia: Secondary | ICD-10-CM | POA: Diagnosis present

## 2020-07-19 DIAGNOSIS — R6520 Severe sepsis without septic shock: Secondary | ICD-10-CM | POA: Diagnosis not present

## 2020-07-19 DIAGNOSIS — E119 Type 2 diabetes mellitus without complications: Secondary | ICD-10-CM

## 2020-07-19 DIAGNOSIS — D689 Coagulation defect, unspecified: Secondary | ICD-10-CM | POA: Diagnosis not present

## 2020-07-19 DIAGNOSIS — J690 Pneumonitis due to inhalation of food and vomit: Secondary | ICD-10-CM | POA: Diagnosis not present

## 2020-07-19 DIAGNOSIS — L8990 Pressure ulcer of unspecified site, unspecified stage: Secondary | ICD-10-CM | POA: Insufficient documentation

## 2020-07-19 DIAGNOSIS — N390 Urinary tract infection, site not specified: Secondary | ICD-10-CM | POA: Diagnosis present

## 2020-07-19 DIAGNOSIS — E872 Acidosis, unspecified: Secondary | ICD-10-CM

## 2020-07-19 DIAGNOSIS — Z8616 Personal history of COVID-19: Secondary | ICD-10-CM

## 2020-07-19 DIAGNOSIS — J9621 Acute and chronic respiratory failure with hypoxia: Secondary | ICD-10-CM | POA: Diagnosis not present

## 2020-07-19 DIAGNOSIS — F410 Panic disorder [episodic paroxysmal anxiety] without agoraphobia: Secondary | ICD-10-CM | POA: Diagnosis present

## 2020-07-19 DIAGNOSIS — K8689 Other specified diseases of pancreas: Secondary | ICD-10-CM | POA: Diagnosis not present

## 2020-07-19 DIAGNOSIS — E1142 Type 2 diabetes mellitus with diabetic polyneuropathy: Secondary | ICD-10-CM | POA: Diagnosis present

## 2020-07-19 DIAGNOSIS — E8809 Other disorders of plasma-protein metabolism, not elsewhere classified: Secondary | ICD-10-CM | POA: Diagnosis not present

## 2020-07-19 DIAGNOSIS — Z9641 Presence of insulin pump (external) (internal): Secondary | ICD-10-CM | POA: Diagnosis present

## 2020-07-19 DIAGNOSIS — A4151 Sepsis due to Escherichia coli [E. coli]: Principal | ICD-10-CM | POA: Diagnosis present

## 2020-07-19 DIAGNOSIS — F411 Generalized anxiety disorder: Secondary | ICD-10-CM | POA: Diagnosis present

## 2020-07-19 DIAGNOSIS — Z9049 Acquired absence of other specified parts of digestive tract: Secondary | ICD-10-CM

## 2020-07-19 DIAGNOSIS — E11649 Type 2 diabetes mellitus with hypoglycemia without coma: Secondary | ICD-10-CM | POA: Diagnosis not present

## 2020-07-19 DIAGNOSIS — K861 Other chronic pancreatitis: Secondary | ICD-10-CM | POA: Diagnosis present

## 2020-07-19 DIAGNOSIS — E440 Moderate protein-calorie malnutrition: Secondary | ICD-10-CM | POA: Diagnosis present

## 2020-07-19 DIAGNOSIS — T17908D Unspecified foreign body in respiratory tract, part unspecified causing other injury, subsequent encounter: Secondary | ICD-10-CM | POA: Diagnosis not present

## 2020-07-19 DIAGNOSIS — I10 Essential (primary) hypertension: Secondary | ICD-10-CM | POA: Diagnosis present

## 2020-07-19 DIAGNOSIS — K7030 Alcoholic cirrhosis of liver without ascites: Secondary | ICD-10-CM | POA: Diagnosis not present

## 2020-07-19 DIAGNOSIS — G894 Chronic pain syndrome: Secondary | ICD-10-CM | POA: Diagnosis present

## 2020-07-19 DIAGNOSIS — G9341 Metabolic encephalopathy: Secondary | ICD-10-CM | POA: Diagnosis present

## 2020-07-19 DIAGNOSIS — R17 Unspecified jaundice: Secondary | ICD-10-CM

## 2020-07-19 DIAGNOSIS — K7682 Hepatic encephalopathy: Secondary | ICD-10-CM

## 2020-07-19 DIAGNOSIS — J9601 Acute respiratory failure with hypoxia: Secondary | ICD-10-CM | POA: Diagnosis not present

## 2020-07-19 DIAGNOSIS — R4701 Aphasia: Secondary | ICD-10-CM | POA: Diagnosis present

## 2020-07-19 DIAGNOSIS — K7040 Alcoholic hepatic failure without coma: Secondary | ICD-10-CM | POA: Diagnosis present

## 2020-07-19 DIAGNOSIS — R4182 Altered mental status, unspecified: Secondary | ICD-10-CM | POA: Diagnosis not present

## 2020-07-19 DIAGNOSIS — A419 Sepsis, unspecified organism: Secondary | ICD-10-CM | POA: Diagnosis present

## 2020-07-19 DIAGNOSIS — D649 Anemia, unspecified: Secondary | ICD-10-CM | POA: Diagnosis not present

## 2020-07-19 DIAGNOSIS — R0603 Acute respiratory distress: Secondary | ICD-10-CM | POA: Diagnosis not present

## 2020-07-19 DIAGNOSIS — D7589 Other specified diseases of blood and blood-forming organs: Secondary | ICD-10-CM | POA: Diagnosis present

## 2020-07-19 DIAGNOSIS — K7210 Chronic hepatic failure without coma: Secondary | ICD-10-CM

## 2020-07-19 DIAGNOSIS — Z7984 Long term (current) use of oral hypoglycemic drugs: Secondary | ICD-10-CM | POA: Diagnosis not present

## 2020-07-19 DIAGNOSIS — T17908A Unspecified foreign body in respiratory tract, part unspecified causing other injury, initial encounter: Secondary | ICD-10-CM | POA: Diagnosis not present

## 2020-07-19 DIAGNOSIS — L89312 Pressure ulcer of right buttock, stage 2: Secondary | ICD-10-CM | POA: Diagnosis present

## 2020-07-19 DIAGNOSIS — D638 Anemia in other chronic diseases classified elsewhere: Secondary | ICD-10-CM | POA: Diagnosis present

## 2020-07-19 LAB — COMPREHENSIVE METABOLIC PANEL
ALT: 22 U/L (ref 0–44)
AST: 59 U/L — ABNORMAL HIGH (ref 15–41)
Albumin: 2.3 g/dL — ABNORMAL LOW (ref 3.5–5.0)
Alkaline Phosphatase: 102 U/L (ref 38–126)
Anion gap: 6 (ref 5–15)
BUN: 12 mg/dL (ref 6–20)
CO2: 26 mmol/L (ref 22–32)
Calcium: 8.3 mg/dL — ABNORMAL LOW (ref 8.9–10.3)
Chloride: 108 mmol/L (ref 98–111)
Creatinine, Ser: 0.39 mg/dL — ABNORMAL LOW (ref 0.44–1.00)
GFR, Estimated: >60 mL/min (ref >60)
Glucose, Bld: 116 mg/dL — ABNORMAL HIGH (ref 70–99)
Potassium: 3.5 mmol/L (ref 3.5–5.1)
Sodium: 140 mmol/L (ref 135–145)
Total Bilirubin: 6.6 mg/dL — ABNORMAL HIGH (ref 0.3–1.2)
Total Protein: 5.7 g/dL — ABNORMAL LOW (ref 6.5–8.1)

## 2020-07-19 LAB — CBC WITH DIFFERENTIAL/PLATELET
Abs Immature Granulocytes: 0.03 10*3/uL (ref 0.00–0.07)
Basophils Absolute: 0 10*3/uL (ref 0.0–0.1)
Basophils Relative: 0 %
Eosinophils Absolute: 0.1 10*3/uL (ref 0.0–0.5)
Eosinophils Relative: 1 %
HCT: 32.9 % — ABNORMAL LOW (ref 36.0–46.0)
Hemoglobin: 10.8 g/dL — ABNORMAL LOW (ref 12.0–15.0)
Immature Granulocytes: 1 %
Lymphocytes Relative: 4 %
Lymphs Abs: 0.2 10*3/uL — ABNORMAL LOW (ref 0.7–4.0)
MCH: 34.2 pg — ABNORMAL HIGH (ref 26.0–34.0)
MCHC: 32.8 g/dL (ref 30.0–36.0)
MCV: 104.1 fL — ABNORMAL HIGH (ref 80.0–100.0)
Monocytes Absolute: 0.4 10*3/uL (ref 0.1–1.0)
Monocytes Relative: 7 %
Neutro Abs: 4.2 10*3/uL (ref 1.7–7.7)
Neutrophils Relative %: 87 %
Platelets: 48 10*3/uL — ABNORMAL LOW (ref 150–400)
RBC: 3.16 MIL/uL — ABNORMAL LOW (ref 3.87–5.11)
RDW: 15.8 % — ABNORMAL HIGH (ref 11.5–15.5)
WBC: 4.8 10*3/uL (ref 4.0–10.5)
nRBC: 0 % (ref 0.0–0.2)

## 2020-07-19 LAB — LACTIC ACID, PLASMA
Lactic Acid, Venous: 2 mmol/L (ref 0.5–1.9)
Lactic Acid, Venous: 2.8 mmol/L (ref 0.5–1.9)
Lactic Acid, Venous: 3.2 mmol/L (ref 0.5–1.9)
Lactic Acid, Venous: 3.2 mmol/L (ref 0.5–1.9)

## 2020-07-19 LAB — URINALYSIS, ROUTINE W REFLEX MICROSCOPIC
Glucose, UA: NEGATIVE mg/dL
Ketones, ur: 5 mg/dL — AB
Nitrite: NEGATIVE
Protein, ur: 100 mg/dL — AB
RBC / HPF: >50 RBC/hpf — ABNORMAL HIGH (ref 0–5)
Specific Gravity, Urine: 1.016 (ref 1.005–1.030)
WBC, UA: >50 WBC/hpf — ABNORMAL HIGH (ref 0–5)
pH: 6 (ref 5.0–8.0)

## 2020-07-19 LAB — BLOOD GAS, VENOUS
Acid-Base Excess: 2.6 mmol/L — ABNORMAL HIGH (ref 0.0–2.0)
Bicarbonate: 26.2 mmol/L (ref 20.0–28.0)
FIO2: 21
O2 Saturation: 66.2 %
Patient temperature: 39.2
pCO2, Ven: 43.3 mmHg — ABNORMAL LOW (ref 44.0–60.0)
pH, Ven: 7.413 (ref 7.250–7.430)
pO2, Ven: 42.3 mmHg (ref 32.0–45.0)

## 2020-07-19 LAB — RAPID URINE DRUG SCREEN, HOSP PERFORMED
Amphetamines: NOT DETECTED
Barbiturates: NOT DETECTED
Benzodiazepines: NOT DETECTED
Cocaine: NOT DETECTED
Opiates: POSITIVE — AB
Tetrahydrocannabinol: NOT DETECTED

## 2020-07-19 LAB — SALICYLATE LEVEL: Salicylate Lvl: <7 mg/dL — ABNORMAL LOW (ref 7.0–30.0)

## 2020-07-19 LAB — AMMONIA: Ammonia: 46 umol/L — ABNORMAL HIGH (ref 9–35)

## 2020-07-19 LAB — GLUCOSE, CAPILLARY
Glucose-Capillary: 148 mg/dL — ABNORMAL HIGH (ref 70–99)
Glucose-Capillary: 323 mg/dL — ABNORMAL HIGH (ref 70–99)
Glucose-Capillary: 202 mg/dL — ABNORMAL HIGH (ref 70–99)

## 2020-07-19 LAB — PROTIME-INR
INR: 1.7 — ABNORMAL HIGH (ref 0.8–1.2)
Prothrombin Time: 20 seconds — ABNORMAL HIGH (ref 11.4–15.2)

## 2020-07-19 LAB — APTT: aPTT: 38 seconds — ABNORMAL HIGH (ref 24–36)

## 2020-07-19 LAB — ACETAMINOPHEN LEVEL: Acetaminophen (Tylenol), Serum: <10 ug/mL — ABNORMAL LOW (ref 10–30)

## 2020-07-19 LAB — CBG MONITORING, ED: Glucose-Capillary: 113 mg/dL — ABNORMAL HIGH (ref 70–99)

## 2020-07-19 LAB — RESP PANEL BY RT-PCR (FLU A&B, COVID) ARPGX2
Influenza A by PCR: NEGATIVE
Influenza B by PCR: NEGATIVE
SARS Coronavirus 2 by RT PCR: NEGATIVE

## 2020-07-19 LAB — PROCALCITONIN: Procalcitonin: 4.94 ng/mL

## 2020-07-19 MED ORDER — PANCRELIPASE (LIP-PROT-AMYL) 12000-38000 UNITS PO CPEP
24000.0000 [IU] | ORAL_CAPSULE | Freq: Three times a day (TID) | ORAL | Status: DC
  Administered 2020-07-19 – 2020-07-28 (×22): 24000 [IU] via ORAL
  Filled 2020-07-19 (×26): qty 2

## 2020-07-19 MED ORDER — KETOROLAC TROMETHAMINE 30 MG/ML IJ SOLN
15.0000 mg | Freq: Once | INTRAMUSCULAR | Status: AC
  Administered 2020-07-19: 15 mg via INTRAVENOUS
  Filled 2020-07-19: qty 1

## 2020-07-19 MED ORDER — VANCOMYCIN HCL IN DEXTROSE 1-5 GM/200ML-% IV SOLN
1000.0000 mg | Freq: Once | INTRAVENOUS | Status: AC
  Administered 2020-07-19: 1000 mg via INTRAVENOUS
  Filled 2020-07-19: qty 200

## 2020-07-19 MED ORDER — IOHEXOL 350 MG/ML SOLN
100.0000 mL | Freq: Once | INTRAVENOUS | Status: AC | PRN
  Administered 2020-07-19: 100 mL via INTRAVENOUS

## 2020-07-19 MED ORDER — CHLORHEXIDINE GLUCONATE CLOTH 2 % EX PADS
6.0000 | MEDICATED_PAD | Freq: Every day | CUTANEOUS | Status: DC
  Administered 2020-07-20 – 2020-07-29 (×10): 6 via TOPICAL

## 2020-07-19 MED ORDER — VANCOMYCIN HCL 750 MG/150ML IV SOLN
750.0000 mg | Freq: Two times a day (BID) | INTRAVENOUS | Status: DC
  Administered 2020-07-20 – 2020-07-21 (×3): 750 mg via INTRAVENOUS
  Filled 2020-07-19 (×3): qty 150

## 2020-07-19 MED ORDER — METRONIDAZOLE 500 MG/100ML IV SOLN
500.0000 mg | Freq: Three times a day (TID) | INTRAVENOUS | Status: DC
  Administered 2020-07-19 – 2020-07-20 (×3): 500 mg via INTRAVENOUS
  Filled 2020-07-19 (×3): qty 100

## 2020-07-19 MED ORDER — ONDANSETRON HCL 4 MG/2ML IJ SOLN
4.0000 mg | Freq: Four times a day (QID) | INTRAMUSCULAR | Status: DC | PRN
Start: 2020-07-19 — End: 2020-07-31
  Administered 2020-07-20: 4 mg via INTRAVENOUS
  Filled 2020-07-19: qty 2

## 2020-07-19 MED ORDER — ONDANSETRON HCL 4 MG PO TABS: 4.0000 mg | ORAL_TABLET | Freq: Four times a day (QID) | ORAL | Status: DC | PRN

## 2020-07-19 MED ORDER — ENSURE ENLIVE PO LIQD
237.0000 mL | Freq: Two times a day (BID) | ORAL | Status: DC
  Administered 2020-07-20 – 2020-07-31 (×12): 237 mL via ORAL

## 2020-07-19 MED ORDER — INSULIN ASPART 100 UNIT/ML IJ SOLN
0.0000 [IU] | Freq: Three times a day (TID) | INTRAMUSCULAR | Status: DC
Start: 2020-07-19 — End: 2020-07-22
  Administered 2020-07-19: 2 [IU] via SUBCUTANEOUS
  Administered 2020-07-20: 8 [IU] via SUBCUTANEOUS
  Administered 2020-07-20: 3 [IU] via SUBCUTANEOUS
  Administered 2020-07-21: 15 [IU] via SUBCUTANEOUS
  Administered 2020-07-21 (×2): 3 [IU] via SUBCUTANEOUS
  Administered 2020-07-22: 11 [IU] via SUBCUTANEOUS
  Administered 2020-07-22: 3 [IU] via SUBCUTANEOUS

## 2020-07-19 MED ORDER — LACTATED RINGERS IV BOLUS (SEPSIS)
1000.0000 mL | Freq: Once | INTRAVENOUS | Status: AC
  Administered 2020-07-19: 1000 mL via INTRAVENOUS

## 2020-07-19 MED ORDER — ALBUTEROL SULFATE (2.5 MG/3ML) 0.083% IN NEBU
2.5000 mg | INHALATION_SOLUTION | Freq: Four times a day (QID) | RESPIRATORY_TRACT | Status: DC | PRN
  Administered 2020-07-20 – 2020-07-28 (×8): 2.5 mg via RESPIRATORY_TRACT
  Filled 2020-07-19 (×8): qty 3

## 2020-07-19 MED ORDER — SODIUM CHLORIDE 0.9 % IV SOLN
2.0000 g | Freq: Once | INTRAVENOUS | Status: AC
  Administered 2020-07-19: 2 g via INTRAVENOUS
  Filled 2020-07-19: qty 2

## 2020-07-19 MED ORDER — GABAPENTIN 100 MG PO CAPS
200.0000 mg | ORAL_CAPSULE | Freq: Three times a day (TID) | ORAL | Status: DC
  Administered 2020-07-19 – 2020-07-23 (×12): 200 mg via ORAL
  Filled 2020-07-19 (×12): qty 2

## 2020-07-19 MED ORDER — LACTATED RINGERS IV SOLN: INTRAVENOUS | Status: DC

## 2020-07-19 MED ORDER — INSULIN ASPART 100 UNIT/ML IJ SOLN
0.0000 [IU] | Freq: Every day | INTRAMUSCULAR | Status: DC
  Administered 2020-07-19 – 2020-07-21 (×3): 2 [IU] via SUBCUTANEOUS

## 2020-07-19 MED ORDER — SODIUM CHLORIDE 0.9 % IV SOLN: INTRAVENOUS | Status: DC

## 2020-07-19 MED ORDER — LEVOTHYROXINE SODIUM 50 MCG PO TABS
50.0000 ug | ORAL_TABLET | Freq: Every day | ORAL | Status: DC
  Administered 2020-07-20 – 2020-07-31 (×10): 50 ug via ORAL
  Filled 2020-07-19 (×2): qty 1
  Filled 2020-07-19: qty 2
  Filled 2020-07-19 (×3): qty 1
  Filled 2020-07-19 (×2): qty 2
  Filled 2020-07-19 (×2): qty 1

## 2020-07-19 MED ORDER — ONDANSETRON HCL 4 MG/2ML IJ SOLN
4.0000 mg | Freq: Once | INTRAMUSCULAR | Status: AC
  Administered 2020-07-19: 4 mg via INTRAVENOUS
  Filled 2020-07-19: qty 2

## 2020-07-19 MED ORDER — RIFAXIMIN 550 MG PO TABS
550.0000 mg | ORAL_TABLET | Freq: Two times a day (BID) | ORAL | Status: DC
  Administered 2020-07-19 – 2020-07-31 (×20): 550 mg via ORAL
  Filled 2020-07-19 (×22): qty 1

## 2020-07-19 MED ORDER — LACTULOSE 10 GM/15ML PO SOLN
30.0000 g | Freq: Three times a day (TID) | ORAL | Status: DC
  Administered 2020-07-19 – 2020-07-23 (×13): 30 g via ORAL
  Filled 2020-07-19 (×14): qty 60

## 2020-07-19 MED ORDER — ALBUMIN HUMAN 25 % IV SOLN
25.0000 g | Freq: Four times a day (QID) | INTRAVENOUS | Status: AC
  Administered 2020-07-19 – 2020-07-20 (×4): 25 g via INTRAVENOUS
  Filled 2020-07-19 (×8): qty 100

## 2020-07-19 MED ORDER — OXYCODONE HCL 5 MG PO TABS
10.0000 mg | ORAL_TABLET | Freq: Four times a day (QID) | ORAL | Status: DC | PRN
  Administered 2020-07-19 – 2020-07-23 (×6): 10 mg via ORAL
  Filled 2020-07-19 (×8): qty 2

## 2020-07-19 MED ORDER — PIPERACILLIN-TAZOBACTAM 3.375 G IVPB 30 MIN
3.3750 g | Freq: Once | INTRAVENOUS | Status: AC
  Administered 2020-07-19: 3.375 g via INTRAVENOUS
  Filled 2020-07-19: qty 50

## 2020-07-19 MED ORDER — SODIUM CHLORIDE 0.9 % IV SOLN
500.0000 mg | INTRAVENOUS | Status: DC
  Administered 2020-07-19: 500 mg via INTRAVENOUS
  Filled 2020-07-19: qty 500

## 2020-07-19 MED ORDER — SODIUM CHLORIDE 0.9 % IV SOLN
2.0000 g | Freq: Three times a day (TID) | INTRAVENOUS | Status: DC
  Administered 2020-07-20: 2 g via INTRAVENOUS
  Filled 2020-07-19 (×6): qty 2

## 2020-07-19 NOTE — Progress Notes (Signed)
Pharmacy Antibiotic Note  [name] is a 55 y.o. female admitted on 07/19/2020 with sepsis.  Pharmacy has been consulted for aztreonam and vancomycin dosing.  Plan: Vancomycin 73m IV every 12 hours.  Goal trough 15-20 mcg/mL. aztreonam 2gm iv q8h  Height: 5' 2"  (157.5 cm) Weight: 59 kg (130 lb) IBW/kg (Calculated) : 50.1  Temp (24hrs), Avg:100.2 F (37.9 C), Min:98.9 F (37.2 C), Max:102.4 F (39.1 C)  Recent Labs  Lab 07/19/20 1008 07/19/20 1151 07/19/20 1253 07/19/20 1455  WBC 4.8  --   --   --   CREATININE 0.39*  --   --   --   LATICACIDVEN 2.0* 2.8* 3.2* 3.2*    Estimated Creatinine Clearance: 61.4 mL/min (A) (by C-G formula based on SCr of 0.39 mg/dL (L)).    Allergies  Allergen Reactions  . Acetaminophen Hives and Other (See Comments)    Tolerates Fioricet  . Cephalexin Itching, Swelling and Other (See Comments)    Tongue swells and makes throat itchy- can tolerate Zosyn  . Ciprofloxacin Hives  . Hydromorphone Hives  . Sulfamethoxazole-Trimethoprim Shortness Of Breath, Swelling and Other (See Comments)    Tongue swells   . Abilify [Aripiprazole]     "I did noyt like the way this made me feel."    Antimicrobials this admission: 5/1 aztreonam >>  5/1 vancomycin >>  5/1 metronidazole >>  Microbiology results: 5/1 BCx: sent  5/1 UCx: sent   5/1 Resp Panel: negative   Thank you for allowing pharmacy to be a part of this patient's care.  GDonna ChristenCoffee 07/19/2020 5:41 PM

## 2020-07-19 NOTE — H&P (Signed)
History and Physical    [name] QVZ:[id number] DOB: [birth year] DOA: 07/19/2020  PCP: [facility] [place]  Patient coming from: home  I have personally briefly reviewed patient's old medical records in [place]  Chief Complaint: altered mental status  HPI: [name] is a 55 y.o. female with medical history significant of cirrhosis, insulin-dependent diabetes on insulin pump, chronic pancreatitis, thrombocytopenia, hypothyroidism who was admitted on admissions for hepatic encephalopathy.  She was brought to the hospital today with altered mental status.  According to records, patient was in her usual state of health last night.  Her patient had gone to be rechecked and she was noted to be lethargic/confused.  Patient did not have any recollection of these events.  She says that she has felt generally unwell for the last week.  She has noticed her urine has become more dark and more foul-smelling.  She does not have any dysuria.  She has chronic shortness of breath but does not really have a cough or sore throat.  She has nausea which is also a chronic issue.  She does have loose stools, but attributes this to her lactulose use which she has been using.  She denies any sores on her skin.  She does complain of neck pain that began this morning.  She denies having it yesterday.  She feels she may have slept in an uncomfortable way.  Denies any changes in her vision.  She has chronic abdominal which she feels is relatively changed  ED Course: On arrival to the emergency room she is medically agreeable.  She is mildly tachycardic, but blood pressure stable.  Imaging did not indicate any anemia.  CT of the abdomen pelvis did not show any clear focus of infection.  There is mention scattered areas of bowel thickening but this may also be present low-protein states.  CT head was also noted to be unremarkable.  CT angiogram of chest was negative for pulmonary embolism  Review of  Systems:  Review of Systems  Constitutional: Positive for chills and fever.  HENT: Negative for congestion and sore throat.   Eyes: Negative for blurred vision and double vision.  Respiratory: Positive for shortness of breath. Negative for cough.   Cardiovascular: Negative for chest pain.  Gastrointestinal: Positive for abdominal pain, nausea and vomiting. Negative for diarrhea.  Genitourinary: Positive for frequency. Negative for dysuria.  Musculoskeletal: Positive for neck pain.  Skin: Negative for rash.  Neurological: Positive for dizziness.       Past Medical History:  Diagnosis Date  . Degenerative joint disease   . Diabetes mellitus without complication ([place])   . Gastroesophageal reflux   . Pancreatitis   . Panic attack   . Sleep apnea   . Thrombocytopenia ([place])     Past Surgical History:  Procedure Laterality Date  . CHOLECYSTECTOMY    . SHOULDER SURGERY     left   . tongue sx. titanium screw placed to hold tongue down    . TOTAL THYROIDECTOMY      Social History:  reports that she has never smoked. She has never used smokeless tobacco. She reports that she does not drink alcohol and does not use drugs.  Allergies  Allergen Reactions  . Acetaminophen Hives and Other (See Comments)    Tolerates Fioricet  . Cephalexin Itching, Swelling and Other (See Comments)    Tongue swells and makes throat itchy- can tolerate Zosyn  . Ciprofloxacin Hives  . Hydromorphone Hives  .  Sulfamethoxazole-Trimethoprim Shortness Of Breath, Swelling and Other (See Comments)    Tongue swells   . Abilify [Aripiprazole]     "I did noyt like the way this made me feel."    Family history: Family history reviewed and not pertinent  Prior to Admission medications   Medication Sig Start Date End Date Taking? Authorizing Provider  albuterol (PROVENTIL) (2.5 MG/3ML) 0.083% nebulizer solution Inhale 2.5 mg into the lungs every 6 (six) hours as needed for wheezing or shortness of breath.   04/19/19   [provider]  albuterol (VENTOLIN HFA) 108 (90 Base) MCG/ACT inhaler Inhale 2 puffs into the lungs every 4 (four) hours as needed for wheezing or shortness of breath. 04/02/20   Roxan Hockey, MD  Continuous Blood Gluc Sensor (DEXCOM G6 SENSOR) MISC Inject into the skin See admin instructions. Place a new sensor under the skin every 10 days    [provider]  CREON 24000-76000 units CPEP Take 1 capsule by mouth 3 (three) times daily before meals.    [provider]  diphenhydrAMINE (BENADRYL) 25 MG tablet Take 25 mg by mouth every 6 (six) hours as needed for allergies or itching.    [provider]  furosemide (LASIX) 20 MG tablet Take 20 mg by mouth in the morning and at bedtime.    [provider]  gabapentin (NEURONTIN) 100 MG capsule Take 2 capsules (200 mg total) by mouth 2 (two) times daily. For agitation/neuropathy Patient taking differently: Take 200 mg by mouth 3 (three) times daily. 05/22/20   Barton Dubois, MD  insulin aspart (NOVOLOG) 100 UNIT/ML injection Inject 10 Units into the skin 3 (three) times daily with meals. For diabetes management Patient taking differently: Inject into the skin See admin instructions. Via Insulin Pump 10/10/19   Lindell Spar I, NP  Insulin Human (INSULIN PUMP) SOLN Inject into the skin continuous.    [provider]  lactulose (CHRONULAC) 10 GM/15ML solution Take 45 mLs (30 g total) by mouth 3 (three) times daily. Patient taking differently: Take 30 g by mouth 3 (three) times daily. Take 30 grams (45 ml's) by mouth three to five times a day until 3 bowel movements a day are achieved, then as directed 04/02/20   Roxan Hockey, MD  levothyroxine (SYNTHROID, LEVOTHROID) 50 MCG tablet Take 50 mcg by mouth daily before breakfast.  07/25/14   [provider]  lipase/protease/amylase (CREON) 12000-38000 units CPEP capsule Take 1 capsule (12,000 Units total) by mouth 3 (three) times daily  before meals. Patient taking differently: No sig reported 04/02/20   Roxan Hockey, MD  megestrol (MEGACE) 40 MG tablet Take 1 tablet (40 mg total) by mouth daily. For Cachexia Patient not taking: No sig reported 10/11/19   Lindell Spar I, NP  mirtazapine (REMERON) 15 MG tablet Take 1 tablet (15 mg total) by mouth at bedtime. For depression Patient not taking: Reported on 06/06/2020 10/10/19   Lindell Spar I, NP  NITROGLYCERIN PO Place 1 tablet under the tongue every 5 (five) minutes as needed (as directed, for chest pain).    [provider]  ondansetron (ZOFRAN ODT) 4 MG disintegrating tablet Take 1 tablet (4 mg total) by mouth every 8 (eight) hours as needed. Patient taking differently: Take 4 mg by mouth See admin instructions. Dissolve 4 mg orally one to two times a day 05/09/20   Ripley Fraise, MD  Oxycodone HCl 10 MG TABS Take 10 mg by mouth every 6 (six) hours. 03/06/20   [provider]  OXYGEN Inhale 2-4 L/min into the lungs as needed (for shortness of breath).    [provider]  potassium chloride SA (KLOR-CON) 20 MEQ tablet Take 1 tablet (20 mEq total) by mouth 2 (two) times daily. 04/02/20   Emokpae, Courage, MD  XIFAXAN 550 MG TABS tablet Take 550 mg by mouth 2 (two) times daily. 05/28/20   [provider]  lisinopril (ZESTRIL) 10 MG tablet Take 10 mg by mouth daily. Patient not taking: Reported on 10/02/2019 03/29/19 10/02/19  [provider]  metFORMIN (GLUCOPHAGE) 1000 MG tablet Take 1,000 mg by mouth 2 (two) times daily with a meal.  Patient not taking: Reported on 10/02/2019  10/02/19  [provider]  metoCLOPramide (REGLAN) 10 MG tablet Take 10 mg by mouth 4 (four) times daily.  Patient not taking: Reported on 10/02/2019  10/02/19  [provider]  sertraline (ZOLOFT) 100 MG tablet Take 150 mg by mouth daily. Patient not taking: Reported on 10/02/2019 09/10/16 10/02/19  [provider]  spironolactone (ALDACTONE)  50 MG tablet Take 50 mg by mouth daily. Patient not taking: Reported on 10/02/2019 03/27/19 10/02/19  [provider]  traZODone (DESYREL) 100 MG tablet Take 1 tablet by mouth daily. Patient not taking: Reported on 10/02/2019 09/15/16 10/02/19  [provider]    Physical Exam: Vitals:   07/19/20 1200 07/19/20 1230 07/19/20 1300 07/19/20 1316  BP: (!) 155/77 (!) 172/82 (!) 163/78   Pulse: (!) 102 (!) 103 100   Resp: (!) 21 (!) 26 (!) 21   Temp:    99.2 F (37.3 C)  TempSrc:    Oral  SpO2: 100% 100% 99%   Weight:      Height:        Constitutional: NAD, calm, comfortable Eyes: PERRL, lids and conjunctivae normal ENMT: Mucous membranes are moist. Posterior pharynx clear of any exudate or lesions.Normal dentition.  Neck: normal, supple, no masses, no thyromegaly Respiratory: clear to auscultation bilaterally, no wheezing, no crackles. Normal respiratory effort. No accessory muscle use.  Cardiovascular: Regular rate and rhythm, no murmurs / rubs / gallops. No extremity edema. 2+ pedal pulses. No carotid bruits.  Abdomen: no tenderness, no masses palpated. No hepatosplenomegaly. Bowel sounds positive.  Musculoskeletal: no clubbing / cyanosis. No joint deformity upper and lower extremities. Good ROM, no contractures. Normal muscle tone.  Skin: no rashes, lesions, ulcers. No induration Neurologic: CN 2-12 grossly intact. Sensation intact, DTR normal. Strength 5/5 in all 4.  Psychiatric: Normal judgment and insight. Alert and oriented x 3. Normal mood.    Labs on Admission: I have personally reviewed following labs and imaging studies  CBC: Recent Labs  Lab 07/19/20 1008  WBC 4.8  NEUTROABS 4.2  HGB 10.8*  HCT 32.9*  MCV 104.1*  PLT 48*   Basic Metabolic Panel: Recent Labs  Lab 07/19/20 1008  NA 140  K 3.5  CL 108  CO2 26  GLUCOSE 116*  BUN 12  CREATININE 0.39*  CALCIUM 8.3*   GFR: Estimated Creatinine Clearance: 61.4 mL/min (A) (by C-G formula based on  SCr of 0.39 mg/dL (L)). Liver Function Tests: Recent Labs  Lab 07/19/20 1008  AST 59*  ALT 22  ALKPHOS 102  BILITOT 6.6*  PROT 5.7*  ALBUMIN 2.3*   No results for input(s): LIPASE, AMYLASE in the last 168 hours. Recent Labs  Lab 07/19/20 1008  AMMONIA 46*   Coagulation Profile: Recent Labs  Lab 07/19/20 1008  INR 1.7*   Cardiac Enzymes:  No results for input(s): CKTOTAL, CKMB, CKMBINDEX, TROPONINI in the last 168 hours. BNP (last 3 results) No results for input(s): PROBNP in the last 8760 hours. HbA1C: No results for input(s): HGBA1C in the last 72 hours. CBG: Recent Labs  Lab 07/19/20 0955  GLUCAP 113*   Lipid Profile: No results for input(s): CHOL, HDL, LDLCALC, TRIG, CHOLHDL, LDLDIRECT in the last 72 hours. Thyroid Function Tests: No results for input(s): TSH, T4TOTAL, FREET4, T3FREE, THYROIDAB in the last 72 hours. Anemia Panel: No results for input(s): VITAMINB12, FOLATE, FERRITIN, TIBC, IRON, RETICCTPCT in the last 72 hours. Urine analysis:    Component Value Date/Time   COLORURINE AMBER (A) 07/19/2020 1130   APPEARANCEUR CLOUDY (A) 07/19/2020 1130   LABSPEC 1.016 07/19/2020 1130   PHURINE 6.0 07/19/2020 1130   GLUCOSEU NEGATIVE 07/19/2020 1130   HGBUR LARGE (A) 07/19/2020 1130   BILIRUBINUR SMALL (A) 07/19/2020 1130   KETONESUR 5 (A) 07/19/2020 1130   PROTEINUR 100 (A) 07/19/2020 1130   UROBILINOGEN 0.2 04/13/2007 1545   NITRITE NEGATIVE 07/19/2020 1130   LEUKOCYTESUR SMALL (A) 07/19/2020 1130    Radiological Exams on Admission: CT Head Wo Contrast  Result Date: 07/19/2020 CLINICAL DATA:  Mental status change EXAM: CT HEAD WITHOUT CONTRAST TECHNIQUE: Contiguous axial images were obtained from the base of the skull through the vertex without intravenous contrast. COMPARISON:  02/29/2020 FINDINGS: Brain: There is no acute intracranial hemorrhage, mass effect, or edema. Gray-white differentiation is preserved. There is no extra-axial fluid collection.  Ventricles and sulci are within normal limits in size and configuration. Vascular: There is atherosclerotic calcification at the skull base. Skull: Calvarium is unremarkable. Sinuses/Orbits: No acute finding. Other: None. IMPRESSION: No acute intracranial abnormality. Electronically Signed   By: Macy Mis M.D.   On: 07/19/2020 12:54   CT Angio Chest PE W/Cm &/Or Wo Cm  Result Date: 07/19/2020 CLINICAL DATA:  Mental status changes.  Lethargy. EXAM: CT ANGIOGRAPHY CHEST CT ABDOMEN AND PELVIS WITH CONTRAST TECHNIQUE: Multidetector CT imaging of the chest was performed using the standard protocol during bolus administration of intravenous contrast. Multiplanar CT image reconstructions and MIPs were obtained to evaluate the vascular anatomy. Multidetector CT imaging of the abdomen and pelvis was performed using the standard protocol during bolus administration of intravenous contrast. CONTRAST:  155m OMNIPAQUE IOHEXOL 350 MG/ML SOLN COMPARISON:  Abdomen and pelvis CT 05/21/2020 FINDINGS: CTA CHEST FINDINGS Cardiovascular: The heart is enlarged. No substantial pericardial effusion. Coronary artery calcification is evident. Atherosclerotic calcification is noted in the wall of the thoracic aorta. There is no filling defect within the opacified pulmonary arteries to suggest the presence of an acute pulmonary embolus. Mediastinum/Nodes: Mild mediastinal lymphadenopathy shows slight progression. 13 mm short axis subcarinal node was 9 mm previously. 12 mm short axis right paratracheal node on 86/7 with somewhat obscured previously but measured about 8 mm. No left hilar lymphadenopathy. No substantial lymphadenopathy in the right hilum. The esophagus has normal imaging features. There is no axillary lymphadenopathy. Lungs/Pleura: Subsegmental atelectasis noted in the lungs bilaterally, basilar predominant. No overt pulmonary edema. No pleural effusion. No suspicious pulmonary nodule or mass. Musculoskeletal: No  worrisome lytic or sclerotic osseous abnormality. Review of the MIP images confirms the above findings. CT ABDOMEN and PELVIS FINDINGS Hepatobiliary: Nodular hepatic contour again noted compatible with cirrhosis. Stable 8 mm subcapsular low-density lesion posterior right liver, too small to characterize but likely a cyst. Gallbladder is surgically absent with marked dilatation of the extrahepatic bile ducts. Common bile duct measures 14  mm diameter in the head of the pancreas, at the same level it measured 15 mm previously. Pancreas: No focal mass lesion. No dilatation of the main duct. No intraparenchymal cyst. No peripancreatic edema. Spleen: 15.5 cm craniocaudal length, enlarged. Adrenals/Urinary Tract: No adrenal nodule or mass. Tiny hypodensities lower pole right kidney are too small to characterize but likely benign. Similar probably benign tiny hypodensity noted upper po no hydronephrosis No evidence for hydroureter. The urinary bladder appears normal for the degree of distention. Stomach/Bowel: Stomach is unremarkable. No gastric wall thickening. No evidence of outlet obstruction. Duodenum is normally positioned as is the ligament of Treitz. Jejunal loops have an appearance suggesting wall thickening but are nondilated. Terminal ileum unremarkable. Low-density wall thickening noted in the cecum. Colon is decompressed. Scattered diverticuli are seen in the left colon without diverticulitis. Vascular/Lymphatic: There is abdominal aortic atherosclerosis without aneurysm. 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 Marked venous collateralization noted in the left upper abdomen with features suggesting spleno renal shunt. There is no gastrohepatic or hepatoduodenal ligament lymphadenopathy. No retroperitoneal or mesenteric lymphadenopathy. No pelvic sidewall lymphadenopathy. Reproductive: The uterus is unremarkable.  There is no adnexal mass. Other: Mesenteric  edema is associated with small volume free fluid in the pelvis. Multiple gas bubbles are seen in the extraperitoneal left pelvic floor, posterior to the bladder and adjacent to the vagina. These have a curvilinear/serpiginous character in some areas raising the question of gas within venous anatomy. No soft tissue or vascular gas elsewhere in the pelvis. As above, there is no evidence of diverticulitis. Musculoskeletal: Diffuse body wall edema. No worrisome lytic or sclerotic osseous abnormality. Review of the MIP images confirms the above findings. IMPRESSION: 1. No CT evidence for acute pulmonary embolus. 2. Mild mediastinal lymphadenopathy, potentially reactive. 3. 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 Splenomegaly with extensive collateralization noted in the left abdomen and spleno renal shunt anatomy is consistent with portal venous hypertension. 4. Low-density wall thickening in the cecum with apparent wall thickening and potential edema in proximal small bowel loops. Infectious/inflammatory etiology a consideration although this could also be seen in the setting of systemic disease (hypoproteinemia). 5. Multiple tiny gas bubbles are seen in the extraperitoneal left pelvic floor, posterior to the bladder and adjacent to the vagina. These have a curvilinear/serpiginous character in some areas raising the question of gas within venous anatomy. No soft tissue or vascular gas elsewhere in the pelvis. No findings to explain the presence of this gas. Follow-up could be used to ensure resolution as clinical picture dictates. 6. Marked dilatation of the extrahepatic bile ducts status post cholecystectomy. This may be related to the prior cholecystectomy and is similar to prior studies. Correlation with liver function test may prove helpful. 7. Diffuse body wall edema with small volume free fluid in the pelvis. 8. Aortic Atherosclerosis (ICD10-I70.0). Electronically Signed   By: Misty Stanley M.D.   On: 07/19/2020 13:17   CT Abdomen Pelvis W Contrast  Result Date: 07/19/2020 CLINICAL DATA:  Mental status changes.  Lethargy. EXAM: CT ANGIOGRAPHY CHEST CT ABDOMEN AND PELVIS WITH CONTRAST TECHNIQUE: Multidetector CT imaging of the chest was performed using the standard protocol during bolus administration of intravenous contrast. Multiplanar CT image reconstructions and MIPs were obtained to evaluate the vascular anatomy. Multidetector CT imaging of the abdomen and pelvis was performed using the standard protocol during bolus administration of intravenous contrast. CONTRAST:  186m OMNIPAQUE IOHEXOL 350 MG/ML SOLN COMPARISON:  Abdomen and pelvis CT 05/21/2020 FINDINGS: CTA CHEST FINDINGS Cardiovascular: The heart is enlarged. No substantial pericardial effusion. Coronary artery calcification is evident. Atherosclerotic calcification is noted in the wall of the thoracic aorta. There is no filling defect within the opacified pulmonary arteries to suggest the presence of an acute pulmonary embolus. Mediastinum/Nodes: Mild mediastinal lymphadenopathy shows slight progression. 13 mm short axis subcarinal node was 9 mm previously. 12 mm short axis right paratracheal node on 86/7 with somewhat obscured previously but measured about 8 mm. No left hilar lymphadenopathy. No substantial lymphadenopathy in the right hilum. The esophagus has normal imaging features. There is no axillary lymphadenopathy. Lungs/Pleura: Subsegmental atelectasis noted in the lungs bilaterally, basilar predominant. No overt pulmonary edema. No pleural effusion. No suspicious pulmonary nodule or mass. Musculoskeletal: No worrisome lytic or sclerotic osseous abnormality. Review of the MIP images confirms the above findings. CT ABDOMEN and PELVIS FINDINGS Hepatobiliary: Nodular hepatic contour again noted compatible with cirrhosis. Stable 8 mm subcapsular low-density lesion posterior right liver, too small to characterize but likely  a cyst. Gallbladder is surgically absent with marked dilatation of the extrahepatic bile ducts. Common bile duct measures 14 mm diameter in the head of the pancreas, at the same level it measured 15 mm previously. Pancreas: No focal mass lesion. No dilatation of the main duct. No intraparenchymal cyst. No peripancreatic edema. Spleen: 15.5 cm craniocaudal length, enlarged. Adrenals/Urinary Tract: No adrenal nodule or mass. Tiny hypodensities lower pole right kidney are too small to characterize but likely benign. Similar probably benign tiny hypodensity noted upper po no hydronephrosis No evidence for hydroureter. The urinary bladder appears normal for the degree of distention. Stomach/Bowel: Stomach is unremarkable. No gastric wall thickening. No evidence of outlet obstruction. Duodenum is normally positioned as is the ligament of Treitz. Jejunal loops have an appearance suggesting wall thickening but are nondilated. Terminal ileum unremarkable. Low-density wall thickening noted in the cecum. Colon is decompressed. Scattered diverticuli are seen in the left colon without diverticulitis. Vascular/Lymphatic: There is abdominal aortic atherosclerosis without aneurysm. 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 Marked venous collateralization noted in the left upper abdomen with features suggesting spleno renal shunt. There is no gastrohepatic or hepatoduodenal ligament lymphadenopathy. No retroperitoneal or mesenteric lymphadenopathy. No pelvic sidewall lymphadenopathy. Reproductive: The uterus is unremarkable.  There is no adnexal mass. Other: Mesenteric edema is associated with small volume free fluid in the pelvis. Multiple gas bubbles are seen in the extraperitoneal left pelvic floor, posterior to the bladder and adjacent to the vagina. These have a curvilinear/serpiginous character in some areas raising the question of gas within venous anatomy. No soft tissue  or vascular gas elsewhere in the pelvis. As above, there is no evidence of diverticulitis. Musculoskeletal: Diffuse body wall edema. No worrisome lytic or sclerotic osseous abnormality. Review of the MIP images confirms the above findings. IMPRESSION: 1. No CT evidence for acute pulmonary embolus. 2. Mild mediastinal lymphadenopathy, potentially reactive. 3. 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 Splenomegaly with extensive collateralization noted in the left abdomen and spleno renal shunt anatomy is consistent with portal venous hypertension. 4. Low-density wall thickening in the cecum with apparent wall thickening and potential edema in proximal small bowel loops. Infectious/inflammatory etiology a consideration although this could also be seen in the setting of systemic disease (hypoproteinemia). 5. Multiple tiny gas bubbles are seen in the extraperitoneal left pelvic floor, posterior to the bladder and adjacent to the vagina. These have  a curvilinear/serpiginous character in some areas raising the question of gas within venous anatomy. No soft tissue or vascular gas elsewhere in the pelvis. No findings to explain the presence of this gas. Follow-up could be used to ensure resolution as clinical picture dictates. 6. Marked dilatation of the extrahepatic bile ducts status post cholecystectomy. This may be related to the prior cholecystectomy and is similar to prior studies. Correlation with liver function test may prove helpful. 7. Diffuse body wall edema with small volume free fluid in the pelvis. 8. Aortic Atherosclerosis (ICD10-I70.0). Electronically Signed   By: Misty Stanley M.D.   On: 07/19/2020 13:17   DG Chest Port 1 View  Result Date: 07/19/2020 CLINICAL DATA:  Possible sepsis, altered mental status EXAM: PORTABLE CHEST 1 VIEW COMPARISON:  07/12/2020 FINDINGS: Similar cardiomediastinal contours. Chronic interstitial prominence with pulmonary vascular congestion. No  significant pleural effusion. No pneumothorax. IMPRESSION: Chronic interstitial prominence with vascular congestion. Electronically Signed   By: Macy Mis M.D.   On: 07/19/2020 11:12    EKG: Independently reviewed. Sinus tachycardia  Assessment/Plan Active Problems:   Thrombocytopenia ([place])   Alcoholic cirrhosis of liver without ascites ([facility])   Essential hypertension   Acquired hypothyroidism   Type 2 diabetes mellitus ([place])   Coagulopathy ([place])   Altered mental status   Sepsis ([place])   Acute lower UTI   DNR (do not resuscitate)   Pancreatic insufficiency     Fever with concerns for possible underlying sepsis -At this point,his infection has not been identified -Her urine could be a source of culture -Blood cultures have also been sent -Lactic acid is elevated although blood pressure stable -She is on broad-spectrum antibiotic with vancomycin, aztreonam and Flagyl -We will de-escalate aspirin condition evolves -She does not have any obvious pneumonia on imaging -CT of the abdomen does not show any clear focus of infection -Although she does complain of neck pain when distracted, she is noted to be moving her head relatively easily.  On passive motions, she does not appear to have any neck stiffness.  Mental status is intact.  Spinal infection would be less likely the situation  -I discussed the option of lumbar puncture -I explained that with her issues of coagulopathy, thrombocytopenia, risk of complications from bleeding would be high.  Even if the patient were to receive platelets and vitamin K, not sure if this was adequately mitigate her risk of bleeding -Since clinically, meningitis appears to be less likely, patient agrees that it would be reasonable to continue current antibiotics and monitor for progress.  If her mental status declines, certainly lumbar puncture after platelets/FFP may be pursued -At this point, she does not appear to have any significant ascites making  SBP less likely as well  Acute metabolic encephalopathy -Suspect this is related to fever and sepsis -Now that overall fever has improved, mental status appears to have improved and approaching baseline -Ammonia was checked and was not significantly elevated compared to prior values  Cirrhosis -continue on xifaxan and lactulose to prevent hepatic encephalopathy -holding diuretics for now in light of sepsis  Lactic acidosis -hemodynamics appear to be stabilizing -she is on metformin which could be contributing -her cirrhosis also puts her at higher risk for lactic acidosis -continue IV fluids and monitor for now  DM2 -she I chronically on insulin pump, but does not use it when she is admitted -start on sliding scale insulin -based on needs, can consider starting basal insulin -hold outpatient metformin -continue gabapentin for neuropathy  Hypothyroidism -continue on synthroid  Pancreatic insufficiency likely related to chronic pancreatitis -continue on creon with meals  Chronic pain syndrome -continue on outpatient dose of oxycodone  Thrombocytopenia -near baseline -related to cirrhosis   DVT prophylaxis: SCD  Code Status: DNR  Family Communication: left VM for significant other  Disposition Plan: pending hospital course, may need placement  Consults called:   Admission status: inpatient, stepdown   Kathie Dike MD [facility]   If 7PM-7AM, please contact night-coverage [email or web address]   07/19/2020, 3:50 PM

## 2020-07-19 NOTE — ED Triage Notes (Signed)
Pt brought to ED by fiance for AMS. He stated when he got home from work this morning she could just look at him and not respond. States when he left at 1900 last night she was oriented and alert and was up straightening the house. States he spoke with her at 0200 and she was acting sleepy.

## 2020-07-19 NOTE — ED Provider Notes (Signed)
[facility] Provider Note   CSN: [id number] Arrival date & time: 07/19/20  0937     History Chief Complaint  Patient presents with  . Altered Mental Status    [name] is a 55 y.o. female.  Level 5 caveat secondary to altered mental status.  History given by her husband who is bringing her here.  She has a history of psychiatric disorder hepatic encephalopathy diabetes.  Husband states she was normal at 7 PM last night and he went to work.  He talked to her at 2 AM and she was drowsy.  This morning he found her altered.  Blood sugars have been in the 160s.  She is on lactulose for encephalopathy and its unclear if she has been taking it.  She does complain of nausea and abdominal pain.  The history is provided by the patient and the spouse.  Altered Mental Status Presenting symptoms: confusion and lethargy   Severity:  Moderate Most recent episode:  Today Episode history:  Single Timing:  Constant Progression:  Unchanged Chronicity:  New Context: not alcohol use   Associated symptoms: abdominal pain, fever and nausea   Associated symptoms: no difficulty breathing and no vomiting   Abdominal pain:    Location:  Generalized   Severity:  Unable to specify   Onset quality:  Gradual   Timing:  Unable to specify   Progression:  Unable to specify      Past Medical History:  Diagnosis Date  . Degenerative joint disease   . Diabetes mellitus without complication ([place])   . Gastroesophageal reflux   . Pancreatitis   . Panic attack   . Sleep apnea   . Thrombocytopenia [facility])     Patient Active Problem List   Diagnosis Date Noted  . Chronic liver failure without hepatic coma ([place])   . Physical deconditioning   . Hepatic encephalopathy ([place]) 05/21/2020  . Anemia   . Coagulopathy ([place])   . Ascites 03/30/2020  . Abnormal weight gain 03/30/2020  . 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 Major  depressive disorder, single episode, severe with psychotic features ([place]) 10/04/2019  . Abnormal head CT 04/02/2019  . Multiple falls 04/02/2019  . Stroke-like symptoms 04/02/2019  . Panic attack 03/30/2019  . History of COPD 03/20/2019  . Chronic abdominal pain 02/05/2019  . DKA (diabetic ketoacidoses) 01/26/2019  . Sleep apnea   . Gastroesophageal reflux   . Hepatitis A 01/13/2019  . 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 COVID-19 01/12/2019  . Glycosuria 01/12/2019  . Hyperbilirubinemia 01/12/2019  . Hypoalbuminemia 01/12/2019  . Normocytic anemia, not due to blood loss 01/12/2019  . Uncontrolled type 2 diabetes mellitus with hyperglycemia, with long-term current use of insulin ([place]) 01/12/2019  . Low TSH level 01/12/2019  . Chest pain 12/16/2018  . Left-sided weakness 12/16/2018  . Weight loss 12/16/2018  . Schizoaffective disorder ([place]) 03/10/2018  . Essential hypertension 03/09/2018  . H/O partial thyroidectomy 10/25/2017  . Thyroid nodule 10/25/2017  . Cricopharyngeal hypertrophy 09/26/2017  . Pharyngoesophageal dysphagia 09/26/2017  . Reaction to severe stress 09/11/2017  . Left hemiparesis ([place]) 04/18/2017  . Disorder of shoulder 03/03/2017  . Lactic acidosis 09/27/2016  . Pancytopenia ([place]) 09/27/2016  . Acquired hypothyroidism 09/27/2016  . Microalbuminuria due to type 2 diabetes mellitus ([place]) 04/20/2016  . Diabetic peripheral neuropathy associated with type 2 diabetes mellitus ([place]) 12/03/2015  . Hepatic steatosis 12/03/2015  . Diabetes mellitus without  complication ([place]) 07/37/1062  . Moderate episode of recurrent major depressive disorder ([place]) 03/20/2015  . Complicated UTI (urinary tract infection) 12/25/2014  . Vaginitis and vulvovaginitis 11/06/2014  . Blurry vision, bilateral 10/17/2014  . Health care maintenance 10/17/2014  . Nausea 10/17/2014  . Multinodular thyroid 07/01/2014  . Cirrhosis of liver ([place]) 03/05/2014  .  Postoperative hypothyroidism 03/05/2014  . Type 2 diabetes mellitus ([place]) 03/05/2014  . Thrombocytopenia ([place]) 01/17/2014  . Alcoholic cirrhosis of liver without ascites ([place]) 01/17/2014  . Back problem 05/28/2013  . Low back pain 04/12/2013  . Borderline personality disorder ([place]) 08/25/2012  . Depression 08/25/2012  . Hypercholesterolemia 08/25/2012  . Osteoarthrosis 08/25/2012  . Gastro-esophageal reflux disease without esophagitis 08/25/2012  . Obstructive sleep apnea 08/25/2012    Past Surgical History:  Procedure Laterality Date  . CHOLECYSTECTOMY    . SHOULDER SURGERY     left   . tongue sx. titanium screw placed to hold tongue down    . TOTAL THYROIDECTOMY       OB History   No obstetric history on file.     No family history on file.  Social History   Tobacco Use  . Smoking status: Never Smoker  . Smokeless tobacco: Never Used  Vaping Use  . Vaping Use: Never used  Substance Use Topics  . Alcohol use: No  . Drug use: No    Home Medications Prior to Admission medications   Medication Sig Start Date End Date Taking? Authorizing Provider  albuterol (PROVENTIL) (2.5 MG/3ML) 0.083% nebulizer solution Inhale 2.5 mg into the lungs every 6 (six) hours as needed for wheezing or shortness of breath.  04/19/19   [provider]  albuterol (VENTOLIN HFA) 108 (90 Base) MCG/ACT inhaler Inhale 2 puffs into the lungs every 4 (four) hours as needed for wheezing or shortness of breath. 04/02/20   Roxan Hockey, MD  Continuous Blood Gluc Sensor (DEXCOM G6 SENSOR) MISC Inject into the skin See admin instructions. Place a new sensor under the skin every 10 days    [provider]  CREON 24000-76000 units CPEP Take 1 capsule by mouth 3 (three) times daily before meals.    [provider]  diphenhydrAMINE (BENADRYL) 25 MG tablet Take 25 mg by mouth every 6 (six) hours as needed for allergies or itching.    [provider]  furosemide (LASIX) 20  MG tablet Take 20 mg by mouth in the morning and at bedtime.    [provider]  gabapentin (NEURONTIN) 100 MG capsule Take 2 capsules (200 mg total) by mouth 2 (two) times daily. For agitation/neuropathy Patient taking differently: Take 200 mg by mouth 3 (three) times daily. 05/22/20   Barton Dubois, MD  insulin aspart (NOVOLOG) 100 UNIT/ML injection Inject 10 Units into the skin 3 (three) times daily with meals. For diabetes management Patient taking differently: Inject into the skin See admin instructions. Via Insulin Pump 10/10/19   Lindell Spar I, NP  Insulin Human (INSULIN PUMP) SOLN Inject into the skin continuous.    [provider]  lactulose (CHRONULAC) 10 GM/15ML solution Take 45 mLs (30 g total) by mouth 3 (three) times daily. Patient taking differently: Take 30 g by mouth 3 (three) times daily. Take 30 grams (45 ml's) by mouth three to five times a day until 3 bowel movements a day are achieved, then as directed 04/02/20   Roxan Hockey, MD  levothyroxine (SYNTHROID, LEVOTHROID) 50 MCG tablet Take 50 mcg by mouth daily before breakfast.  07/25/14   [provider]  lipase/protease/amylase (CREON) 12000-38000 units CPEP capsule Take 1 capsule (12,000 Units total) by mouth 3 (three) times daily before meals. Patient taking differently: No sig reported 04/02/20   Roxan Hockey, MD  megestrol (MEGACE) 40 MG tablet Take 1 tablet (40 mg total) by mouth daily. For Cachexia Patient not taking: No sig reported 10/11/19   Lindell Spar I, NP  mirtazapine (REMERON) 15 MG tablet Take 1 tablet (15 mg total) by mouth at bedtime. For depression Patient not taking: Reported on 06/06/2020 10/10/19   Lindell Spar I, NP  NITROGLYCERIN PO Place 1 tablet under the tongue every 5 (five) minutes as needed (as directed, for chest pain).    [provider]  ondansetron (ZOFRAN ODT) 4 MG disintegrating tablet Take 1 tablet (4 mg total) by mouth every 8 (eight) hours as  needed. Patient taking differently: Take 4 mg by mouth See admin instructions. Dissolve 4 mg orally one to two times a day 05/09/20   Ripley Fraise, MD  Oxycodone HCl 10 MG TABS Take 10 mg by mouth every 6 (six) hours. 03/06/20   [provider]  OXYGEN Inhale 2-4 L/min into the lungs as needed (for shortness of breath).    [provider]  potassium chloride SA (KLOR-CON) 20 MEQ tablet Take 1 tablet (20 mEq total) by mouth 2 (two) times daily. 04/02/20   Emokpae, Courage, MD  XIFAXAN 550 MG TABS tablet Take 550 mg by mouth 2 (two) times daily. 05/28/20   [provider]  lisinopril (ZESTRIL) 10 MG tablet Take 10 mg by mouth daily. Patient not taking: Reported on 10/02/2019 03/29/19 10/02/19  [provider]  metFORMIN (GLUCOPHAGE) 1000 MG tablet Take 1,000 mg by mouth 2 (two) times daily with a meal.  Patient not taking: Reported on 10/02/2019  10/02/19  [provider]  metoCLOPramide (REGLAN) 10 MG tablet Take 10 mg by mouth 4 (four) times daily.  Patient not taking: Reported on 10/02/2019  10/02/19  [provider]  sertraline (ZOLOFT) 100 MG tablet Take 150 mg by mouth daily. Patient not taking: Reported on 10/02/2019 09/10/16 10/02/19  [provider]  spironolactone (ALDACTONE) 50 MG tablet Take 50 mg by mouth daily. Patient not taking: Reported on 10/02/2019 03/27/19 10/02/19  [provider]  traZODone (DESYREL) 100 MG tablet Take 1 tablet by mouth daily. Patient not taking: Reported on 10/02/2019 09/15/16 10/02/19  [provider]    Allergies    Acetaminophen, Cephalexin, Ciprofloxacin, Hydromorphone, Sulfamethoxazole-trimethoprim, and Abilify [aripiprazole]  Review of Systems   Review of Systems  Unable to perform ROS: Mental status change  Constitutional: Positive for fever.  Gastrointestinal: Positive for abdominal pain and nausea. Negative for vomiting.  Psychiatric/Behavioral: Positive for confusion.     Physical Exam Updated Vital Signs BP (!) 164/71   Pulse (!) 111   Temp (!) 102.4 F (39.1 C) (Oral)   Resp (!) 23   Ht 5' 2"  (1.575 m)   Wt 59 kg   SpO2 92%   BMI 23.78 kg/m   Physical Exam Vitals and nursing note reviewed.  Constitutional:      General: She is awake. She is not in acute distress.    Appearance: She is well-developed and underweight.  HENT:     Head: Normocephalic and atraumatic.  Eyes:     Conjunctiva/sclera: Conjunctivae normal.  Cardiovascular:     Rate and Rhythm: Regular rhythm. Tachycardia present.     Pulses: Normal pulses.  Heart sounds: No murmur heard.   Pulmonary:     Effort: Pulmonary effort is normal. No respiratory distress.     Breath sounds: Normal breath sounds.  Abdominal:     Palpations: Abdomen is soft. There is no mass.     Tenderness: There is abdominal tenderness (generalized). There is no guarding or rebound.     Hernia: No hernia is present.  Musculoskeletal:        General: No deformity. Normal range of motion.     Cervical back: Neck supple.     Right lower leg: No edema.     Left lower leg: No edema.  Skin:    General: Skin is warm and dry.  Neurological:     General: No focal deficit present.     Mental Status: She is lethargic.     Comments: She is awake and will answer some simple questions.  She is moving all extremities nonfocaly     ED Results / Procedures / Treatments   Labs (all labs ordered are listed, but only abnormal results are displayed) Labs Reviewed  CBG MONITORING, ED - Abnormal; Notable for the following components:      Result Value   Glucose-Capillary 113 (*)    All other components within normal limits  RESP PANEL BY RT-PCR (FLU A&B, COVID) ARPGX2  CULTURE, BLOOD (ROUTINE X 2)  CULTURE, BLOOD (ROUTINE X 2)  URINE CULTURE  LACTIC ACID, PLASMA  LACTIC ACID, PLASMA  COMPREHENSIVE METABOLIC PANEL  CBC WITH DIFFERENTIAL/PLATELET  PROTIME-INR  APTT  URINALYSIS, ROUTINE W REFLEX  MICROSCOPIC  BLOOD GAS, VENOUS    EKG None  Radiology No results found.  Procedures .Critical Care Performed by: Hayden Rasmussen, MD Authorized by: Hayden Rasmussen, MD   Critical care provider statement:    Critical care time (minutes):  45   Critical care time was exclusive of:  Separately billable procedures and treating other patients   Critical care was necessary to treat or prevent imminent or life-threatening deterioration of the following conditions:  Sepsis   Critical care was time spent personally by me on the following activities:  Discussions with consultants, evaluation of patient's response to treatment, examination of patient, ordering and performing treatments and interventions, ordering and review of laboratory studies, ordering and review of radiographic studies, pulse oximetry, re-evaluation of patient's condition, obtaining history from patient or surrogate, review of old charts and development of treatment plan with patient or surrogate     Medications Ordered in ED Medications  oxyCODONE (Oxy IR/ROXICODONE) immediate release tablet 10 mg (has no administration in time range)  rifaximin (XIFAXAN) tablet 550 mg (has no administration in time range)  levothyroxine (SYNTHROID) tablet 50 mcg (has no administration in time range)  lipase/protease/amylase (CREON) capsule 24,000 Units (24,000 Units Oral Given 07/19/20 1703)  lactulose (CHRONULAC) 10 GM/15ML solution 30 g (30 g Oral Given 07/19/20 1706)  gabapentin (NEURONTIN) capsule 200 mg (200 mg Oral Given 07/19/20 1703)  albuterol (PROVENTIL) (2.5 MG/3ML) 0.083% nebulizer solution 2.5 mg (has no administration in time range)  insulin aspart (novoLOG) injection 0-15 Units (has no administration in time range)  insulin aspart (novoLOG) injection 0-5 Units (has no administration in time range)  0.9 %  sodium chloride infusion ( Intravenous New Bag/Given 07/19/20 1656)  albumin human 25 % solution 25 g (25 g Intravenous  New Bag/Given 07/19/20 1702)  ondansetron (ZOFRAN) tablet 4 mg (has no administration in time range)    Or  ondansetron (ZOFRAN) injection 4  mg (has no administration in time range)  aztreonam (AZACTAM) 2 g in sodium chloride 0.9 % 100 mL IVPB (has no administration in time range)  metroNIDAZOLE (FLAGYL) IVPB 500 mg (500 mg Intravenous New Bag/Given 07/19/20 1658)  vancomycin (VANCOCIN) IVPB 1000 mg/200 mL premix (has no administration in time range)  Chlorhexidine Gluconate Cloth 2 % PADS 6 each (has no administration in time range)  lactated ringers bolus 1,000 mL (0 mLs Intravenous Stopped 07/19/20 1126)    And  lactated ringers bolus 1,000 mL (0 mLs Intravenous Stopped 07/19/20 1236)  ondansetron (ZOFRAN) injection 4 mg (4 mg Intravenous Given 07/19/20 1015)  ketorolac (TORADOL) 30 MG/ML injection 15 mg (15 mg Intravenous Given 07/19/20 1017)  piperacillin-tazobactam (ZOSYN) IVPB 3.375 g (0 g Intravenous Stopped 07/19/20 1301)  iohexol (OMNIPAQUE) 350 MG/ML injection 100 mL (100 mLs Intravenous Contrast Given 07/19/20 1222)    ED Course  I have reviewed the triage vital signs and the nursing notes.  Pertinent labs & imaging results that were available during my care of the patient were reviewed by me and considered in my medical decision making (see chart for details).  Clinical Course as of 07/19/20 1727  Sun Jul 19, 2020  1244 Second lactate is rising.  Infectious source appears to be urine.  Nurse wants to redraw the lactate as she is wondering if the tourniquet was on too long. [MB]  1427 Discussed with Dr. Roderic Palau from [facility] who will evaluate the patient for admission. [MB]    Clinical Course User Index [MB] Hayden Rasmussen, MD   MDM Rules/Calculators/A&P                         [name] was evaluated in Emergency Department on 07/19/2020 for the symptoms described in the history of present illness. She was evaluated in the context of the global COVID-19 pandemic, which  necessitated consideration that the patient might be at risk for infection with the SARS-CoV-2 virus that causes COVID-19. Institutional protocols and algorithms that pertain to the evaluation of patients at risk for COVID-19 are in a state of rapid change based on information released by regulatory bodies including the CDC and federal and state organizations. These policies and algorithms were followed during the patient's care in the ED.  This patient complains of altered mental status, fever, lethargy; this involves an extensive number of treatment Options and is a complaint that carries with it a high risk of complications and Morbidity. The differential includes sepsis, Sirs, pneumonia, hepatic encephalopathy, pyelonephritis, intra-abdominal catastrophe, stroke, bleed  I ordered, reviewed and interpreted labs, which included CBC with normal white count, hemoglobin low stable from priors, platelets low chronic, chemistries fairly unremarkable, LFTs mildly elevated consistent with priors, urinalysis concerning for infection, ammonia mildly elevated, aspirin and Tylenol negative, VBG without acute acidosis or significant CO2 retention, COVID testing negative, lactate is elevated and rising I ordered medication IV fluids for sepsis protocol, IV antibiotics I ordered imaging studies which included chest x-ray, CT head CT angio chest CT abdomen and pelvis with contrast and I independently    visualized and interpreted imaging which showed no clear indication for septic source. Additional history obtained from patient's husband Previous records obtained and reviewed in epic, last ED visit was for psychiatric issues I consulted [facility] Dr. Roderic Palau and discussed lab and imaging findings  Critical Interventions: Evaluation and management of patient's sepsis with aggressive antibiotics and IV fluids  After the interventions  stated above, I reevaluated the patient and found patient to be  hemodynamically improved.  Lactates unfortunately are rising.  Will need admission to the hospital for further management.  Patient in agreement with plan for admission.   Final Clinical Impression(s) / ED Diagnoses Final diagnoses:  Sepsis with encephalopathy without septic shock, due to unspecified organism ([place])  Lactic acidosis  Thrombocytopenia ([place])    Rx / DC Orders ED Discharge Orders    None       Hayden Rasmussen, MD 07/19/20 1733

## 2020-07-19 NOTE — ED Notes (Signed)
Per VO by Dr. Melina Copa, patient dexcom 6 monitor and dexcom insulin infuser removed from patient.

## 2020-07-19 NOTE — Sepsis Progress Note (Signed)
elink following code sepsis

## 2020-07-20 DIAGNOSIS — R4182 Altered mental status, unspecified: Secondary | ICD-10-CM

## 2020-07-20 LAB — GLUCOSE, CAPILLARY
Glucose-Capillary: 95 mg/dL (ref 70–99)
Glucose-Capillary: 178 mg/dL — ABNORMAL HIGH (ref 70–99)
Glucose-Capillary: 265 mg/dL — ABNORMAL HIGH (ref 70–99)
Glucose-Capillary: 207 mg/dL — ABNORMAL HIGH (ref 70–99)

## 2020-07-20 LAB — LACTIC ACID, PLASMA: Lactic Acid, Venous: 1.2 mmol/L (ref 0.5–1.9)

## 2020-07-20 LAB — COMPREHENSIVE METABOLIC PANEL
ALT: 16 U/L (ref 0–44)
AST: 42 U/L — ABNORMAL HIGH (ref 15–41)
Albumin: 2.5 g/dL — ABNORMAL LOW (ref 3.5–5.0)
Alkaline Phosphatase: 64 U/L (ref 38–126)
Anion gap: 4 — ABNORMAL LOW (ref 5–15)
BUN: 18 mg/dL (ref 6–20)
CO2: 26 mmol/L (ref 22–32)
Calcium: 7.8 mg/dL — ABNORMAL LOW (ref 8.9–10.3)
Chloride: 109 mmol/L (ref 98–111)
Creatinine, Ser: 0.58 mg/dL (ref 0.44–1.00)
GFR, Estimated: >60 mL/min (ref >60)
Glucose, Bld: 109 mg/dL — ABNORMAL HIGH (ref 70–99)
Potassium: 3.7 mmol/L (ref 3.5–5.1)
Sodium: 139 mmol/L (ref 135–145)
Total Bilirubin: 6.1 mg/dL — ABNORMAL HIGH (ref 0.3–1.2)
Total Protein: 5 g/dL — ABNORMAL LOW (ref 6.5–8.1)

## 2020-07-20 LAB — CBC
HCT: 23.2 % — ABNORMAL LOW (ref 36.0–46.0)
Hemoglobin: 7.6 g/dL — ABNORMAL LOW (ref 12.0–15.0)
MCH: 34.9 pg — ABNORMAL HIGH (ref 26.0–34.0)
MCHC: 32.8 g/dL (ref 30.0–36.0)
MCV: 106.4 fL — ABNORMAL HIGH (ref 80.0–100.0)
Platelets: 32 10*3/uL — ABNORMAL LOW (ref 150–400)
RBC: 2.18 MIL/uL — ABNORMAL LOW (ref 3.87–5.11)
RDW: 16.5 % — ABNORMAL HIGH (ref 11.5–15.5)
WBC: 2.6 10*3/uL — ABNORMAL LOW (ref 4.0–10.5)
nRBC: 0 % (ref 0.0–0.2)

## 2020-07-20 LAB — MRSA PCR SCREENING: MRSA by PCR: POSITIVE — AB

## 2020-07-20 LAB — IRON AND TIBC
Iron: 67 ug/dL (ref 28–170)
Saturation Ratios: 45 % — ABNORMAL HIGH (ref 10.4–31.8)
TIBC: 150 ug/dL — ABNORMAL LOW (ref 250–450)
UIBC: 83 ug/dL

## 2020-07-20 LAB — FOLATE: Folate: 20.5 ng/mL (ref >5.9)

## 2020-07-20 LAB — RETICULOCYTES
Immature Retic Fract: 25.3 % — ABNORMAL HIGH (ref 2.3–15.9)
RBC.: 2.22 MIL/uL — ABNORMAL LOW (ref 3.87–5.11)
Retic Count, Absolute: 155.8 10*3/uL (ref 19.0–186.0)
Retic Ct Pct: 7 % — ABNORMAL HIGH (ref 0.4–3.1)

## 2020-07-20 LAB — PROTIME-INR
INR: 2.2 — ABNORMAL HIGH (ref 0.8–1.2)
Prothrombin Time: 24.7 seconds — ABNORMAL HIGH (ref 11.4–15.2)

## 2020-07-20 LAB — OCCULT BLOOD X 1 CARD TO LAB, STOOL: Fecal Occult Bld: POSITIVE — AB

## 2020-07-20 LAB — FERRITIN: Ferritin: 33 ng/mL (ref 11–307)

## 2020-07-20 LAB — VITAMIN B12: Vitamin B-12: 611 pg/mL (ref 180–914)

## 2020-07-20 MED ORDER — IBUPROFEN 400 MG PO TABS
400.0000 mg | ORAL_TABLET | Freq: Four times a day (QID) | ORAL | Status: DC | PRN
  Administered 2020-07-20 – 2020-07-21 (×3): 400 mg via ORAL
  Filled 2020-07-20 (×3): qty 1

## 2020-07-20 MED ORDER — ADULT MULTIVITAMIN W/MINERALS CH
1.0000 | ORAL_TABLET | Freq: Every day | ORAL | Status: DC
  Administered 2020-07-20 – 2020-07-31 (×9): 1 via ORAL
  Filled 2020-07-20 (×10): qty 1

## 2020-07-20 MED ORDER — PROSOURCE PLUS PO LIQD
30.0000 mL | Freq: Two times a day (BID) | ORAL | Status: DC
  Administered 2020-07-20 – 2020-07-31 (×11): 30 mL via ORAL
  Filled 2020-07-20 (×14): qty 30

## 2020-07-20 MED ORDER — MUPIROCIN 2 % EX OINT
1.0000 "application " | TOPICAL_OINTMENT | Freq: Two times a day (BID) | CUTANEOUS | Status: AC
  Administered 2020-07-20 – 2020-07-24 (×9): 1 via NASAL
  Filled 2020-07-20 (×4): qty 22

## 2020-07-20 MED ORDER — PIPERACILLIN-TAZOBACTAM 3.375 G IVPB
3.3750 g | Freq: Once | INTRAVENOUS | Status: AC
  Administered 2020-07-20: 3.375 g via INTRAVENOUS
  Filled 2020-07-20: qty 50

## 2020-07-20 MED ORDER — METHOCARBAMOL 500 MG PO TABS
750.0000 mg | ORAL_TABLET | Freq: Three times a day (TID) | ORAL | Status: DC | PRN
  Administered 2020-07-20: 750 mg via ORAL
  Filled 2020-07-20: qty 2

## 2020-07-20 MED ORDER — PIPERACILLIN-TAZOBACTAM 3.375 G IVPB
3.3750 g | Freq: Three times a day (TID) | INTRAVENOUS | Status: DC
  Administered 2020-07-20 – 2020-07-22 (×5): 3.375 g via INTRAVENOUS
  Filled 2020-07-20 (×5): qty 50

## 2020-07-20 MED ORDER — ORAL CARE MOUTH RINSE
15.0000 mL | Freq: Two times a day (BID) | OROMUCOSAL | Status: DC
  Administered 2020-07-20 – 2020-07-30 (×15): 15 mL via OROMUCOSAL

## 2020-07-20 NOTE — Progress Notes (Signed)
PROGRESS NOTE    [name]  ZJI:[id number] DOB: 1966/02/25 DOA: 07/19/2020 PCP: [facility]   Chief Complaint  Patient presents with  . Altered Mental Status    Brief Narrative: [name] is a 55 y.o. female with medical history significant of cirrhosis, insulin-dependent diabetes on insulin pump, chronic pancreatitis, thrombocytopenia (usually in the 60s), and hypothyroidism who was admitted on 5/1 for hepatic encephalopathy. See HPI and H&P for details. On admission, pt continued to have confusion and noted new-onset neck pain. Mentation has improved, in that she is alert, oriented with choices, and able to follow commands albeit slowly processing before responding. Thrombocytopenia is persisting despite platelet transfusion.  Assessment & Plan:   Active Problems:   Thrombocytopenia ([place])   Alcoholic cirrhosis of liver without ascites (HCC)   Essential hypertension   Acquired hypothyroidism   Type 2 diabetes mellitus ([place])   Coagulopathy (HCC)   Altered mental status   Sepsis ([place])   Acute lower UTI   DNR (do not resuscitate)   Pancreatic insufficiency   Fever with Concern for Underlying Sepsis - On presentation, 102.5 F, HR 110, AMS, sepsis protocol initiated - UCx and BCx sent - Started on broad spectrum Vanc, Aztreonam, and Flagyl - CXR no signs of obvious PNA - CT Abdomen does not show any foci of infection - No ascites or acute abdomen on exam, making SBP less likely - Pt complains of neck pain, though passive neck flexion and psoas signs were negative, making meningitis less likely. Discussion w/patient, patient agrees that it would be reasonable to continue current antibiotics and monitor for progress.  If her mental status declines, LP could be considered. - Continue to monitor fevers, mental status, and f/u on BCx and UCx  Acute Metabolic Encephalopathy - Given unremarkable imaging and roughly baseline ammonia levels, AMS appears to be  from fever and sepsis - As fever is improving, mental status is improving. She is now oriented with choices but slow to respond in an expressive-aphasia pattern - Continue to check ammonia and trend  Thrombocytopenia - Per prior records, baseline low 2/2 cirrhosis - Pt 48 on admission, now dropped to 32  Anemia - Macrocytic, though may be mixed etiology - Hb dropped from 10.8 to 7.6 in 1 day - B12 elevated, Folate nl - Ferritin nl, Fe nl, TIBC low, Saturation Ratio high (ddx includes anemia of chronic disease)   Cirrhosis of Liver - Holding home diuretics in setting of possible sepsis - Continue home xifaxan and lactulose to prevent hepatic encephalopathy  Lactic Acidosis - Elevated to _____ on admission, quickly resolved. - Continue to trend  DM2 - Chronically on insulin pump, hold while admitted - Hold home metformin - Continue SSI and consider starting basal meds - Continue home gabapentin for peripheral neuropathy  Hypothyroidism - Continue home levothyroxine  Pancreatic Insufficiency, likely 2/2 Chronic Pancreatitis - Continue home Creon during meals   DVT prophylaxis: SCDs Code Status: DNR Family Communication: (Specify name, relationship & date discussed. NO "discussed with patient") Disposition:   Status is: Inpatient, Stepdown  Remains inpatient appropriate because:Altered mental status and Thrombocytopenia   Dispo: The patient is from: Home              Anticipated d/c is to: Home              Patient currently is not medically stable to d/c.   Difficult to place patient No       Consultants:   None  at this time  Procedures:   None at this time  Antimicrobials:  Vanc, Aztreonam, and Flagyl   Subjective: [name] is a 55 y.o. female with medical history significant of cirrhosis, insulin-dependent diabetes on insulin pump, chronic pancreatitis, thrombocytopenia (usually in the 60s), and hypothyroidism who was admitted on 5/1 for  hepatic encephalopathy. Briefly, the week before admission she felt generally unwell, noted darker and more foul-swelling urine but no dysuria, and the night before admission developed lethargy, confusion, fevers, chills, nausea, and nonbilious vomiting. In the ED, pt had no memory of the prior evening, continued to have AMS, and was mildly tachycardic. Other ssx, including bowel pain, SOB, urinary frequency and diarrhea 2/2 lactulose therapy were unchanged from her baseline. CT head unremarkable for acute processes, CTA chest r/o PE, and CT Abdomen/Pelvis mentioned scattered areas of thickening.   5/1 - On admission, pt continued to have confusion and noted new-onset neck pain.  5/2 - Mentation has improved, in that she is alert, oriented with choices, and able to follow commands albeit slowly processing before responding. SOB has decreased and pt feels more comfortable.   Objective: Vitals:   07/20/20 1000 07/20/20 1100 07/20/20 1113 07/20/20 1200  BP: (!) 151/61 (!) 147/54  (!) 149/72  Pulse: 76 82 87 84  Resp: 16 19 (!) 21 (!) 21  Temp:   97.8 F (36.6 C)   TempSrc:   Oral   SpO2: 94% 94% 95% 92%  Weight:      Height:        Intake/Output Summary (Last 24 hours) at 07/20/2020 1223 Last data filed at 07/20/2020 0926 Gross per 24 hour  Intake 4663.86 ml  Output 350 ml  Net 4313.86 ml   Filed Weights   07/19/20 0951  Weight: 59 kg    Examination:  General exam: Appears calm and comfortable. Mildly confused, with some retrograde amnesia regarding admission. Respiratory system: Clear to auscultation. Respiratory effort normal. Cardiovascular system: S1 & S2 heard, RRR. No JVD, murmurs, rubs, gallops or clicks. No pedal edema. Gastrointestinal system: Abdomen is tender to light palpation in LUQ and only slightly tender in other quadrants to light palpation. Normal bowel sounds heard. Central nervous system: Alert and oriented x3 but response time was slow. Sensation to light touch on L  side grossly diminished, per her baseline. Passive flexion of neck negative for pain, Psoas sign negative bilaterally. Extremities: Symmetric 5 x 5 power. Skin: No lesions or ulcers. No petechiae, purpura, or ecchymoses. Psychiatry: Judgement and insight appear normal. Mood & affect appropriate. Slow to respond.    Data Reviewed:   CBC: Recent Labs  Lab 07/19/20 1008 07/20/20 0635  WBC 4.8 2.6*  NEUTROABS 4.2  --   HGB 10.8* 7.6*  HCT 32.9* 23.2*  MCV 104.1* 106.4*  PLT 48* 32*    Basic Metabolic Panel: Recent Labs  Lab 07/19/20 1008 07/20/20 0508  NA 140 139  K 3.5 3.7  CL 108 109  CO2 26 26  GLUCOSE 116* 109*  BUN 12 18  CREATININE 0.39* 0.58  CALCIUM 8.3* 7.8*    GFR: Estimated Creatinine Clearance: 61.4 mL/min (by C-G formula based on SCr of 0.58 mg/dL).  Liver Function Tests: Recent Labs  Lab 07/19/20 1008 07/20/20 0508  AST 59* 42*  ALT 22 16  ALKPHOS 102 64  BILITOT 6.6* 6.1*  PROT 5.7* 5.0*  ALBUMIN 2.3* 2.5*    CBG: Recent Labs  Lab 07/19/20 1657 07/19/20 2010 07/19/20 2205 07/20/20 0725 07/20/20  1114  GLUCAP 148* 323* 202* 95 178*     Recent Results (from the past 240 hour(s))  Resp Panel by RT-PCR (Flu A&B, Covid) Nasopharyngeal Swab     Status: None   Collection Time: 07/19/20  9:59 AM   Specimen: Nasopharyngeal Swab; Nasopharyngeal(NP) swabs in vial transport medium  Result Value Ref Range Status   SARS Coronavirus 2 by RT PCR NEGATIVE NEGATIVE Final    Comment: (NOTE) SARS-CoV-2 target nucleic acids are NOT DETECTED.  The SARS-CoV-2 RNA is generally detectable in upper respiratory specimens during the acute phase of infection. The lowest concentration of SARS-CoV-2 viral copies this assay can detect is 138 copies/mL. A negative result does not preclude SARS-Cov-2 infection and should not be used as the sole basis for treatment or other patient management decisions. A negative result may occur with  improper specimen  collection/handling, submission of specimen other than nasopharyngeal swab, presence of viral mutation(s) within the areas targeted by this assay, and inadequate number of viral copies(<138 copies/mL). A negative result must be combined with clinical observations, patient history, and epidemiological information. The expected result is Negative.  Fact Sheet for Patients:  EntrepreneurPulse.com.au  Fact Sheet for Healthcare Providers:  IncredibleEmployment.be  This test is no t yet approved or cleared by the [place] FDA and  has been authorized for detection and/or diagnosis of SARS-CoV-2 by FDA under an Emergency Use Authorization (EUA). This EUA will remain  in effect (meaning this test can be used) for the duration of the COVID-19 declaration under Section 564(b)(1) of the Act, 21 U.S.C.section 360bbb-3(b)(1), unless the authorization is terminated  or revoked sooner.       Influenza A by PCR NEGATIVE NEGATIVE Final   Influenza B by PCR NEGATIVE NEGATIVE Final    Comment: (NOTE) The Xpert Xpress SARS-CoV-2/FLU/RSV plus assay is intended as an aid in the diagnosis of influenza from Nasopharyngeal swab specimens and should not be used as a sole basis for treatment. Nasal washings and aspirates are unacceptable for Xpert Xpress SARS-CoV-2/FLU/RSV testing.  Fact Sheet for Patients: EntrepreneurPulse.com.au  Fact Sheet for Healthcare Providers: IncredibleEmployment.be  This test is not yet approved or cleared by the [place] FDA and has been authorized for detection and/or diagnosis of SARS-CoV-2 by FDA under an Emergency Use Authorization (EUA). This EUA will remain in effect (meaning this test can be used) for the duration of the COVID-19 declaration under Section 564(b)(1) of the Act, 21 U.S.C. section 360bbb-3(b)(1), unless the authorization is terminated or revoked.  Performed at [facility], [address], [place], [place] [address]   Blood Culture (routine x 2)     Status: None (Preliminary result)   Collection Time: 07/19/20 10:10 AM   Specimen: BLOOD RIGHT FOREARM  Result Value Ref Range Status   Specimen Description   Final    BLOOD RIGHT FOREARM BOTTLES DRAWN AEROBIC AND ANAEROBIC   Special Requests Blood Culture adequate volume  Final   Culture   Final    NO GROWTH < 24 HOURS Performed at [facility], [address] [place], [place] [address]    Report Status PENDING  Incomplete  Blood Culture (routine x 2)     Status: None (Preliminary result)   Collection Time: 07/19/20 10:10 AM   Specimen: Left Antecubital; Blood  Result Value Ref Range Status   Specimen Description   Final    LEFT ANTECUBITAL BOTTLES DRAWN AEROBIC AND ANAEROBIC   Special Requests   Final    Blood Culture  results may not be optimal due to an excessive volume of blood received in culture bottles   Culture   Final    NO GROWTH < 24 HOURS Performed at [facility], [address], [place], [place] [address]    Report Status PENDING  Incomplete  MRSA PCR Screening     Status: Abnormal   Collection Time: 07/19/20  5:18 PM   Specimen: Nasopharyngeal  Result Value Ref Range Status   MRSA by PCR POSITIVE (A) NEGATIVE Final    Comment:        The GeneXpert MRSA Assay (FDA approved for NASAL specimens only), is one component of a comprehensive MRSA colonization surveillance program. It is not intended to diagnose MRSA infection nor to guide or monitor treatment for MRSA infections. RESULT CALLED TO, READ BACK BY AND VERIFIED WITH: LEDWELL, L@1145  by [email or web address],b 5.2.22 Performed at [facility], [address] [place], [place] [address]          Radiology Studies: CT Head Wo Contrast  Result Date: 07/19/2020 CLINICAL DATA:  Mental status change EXAM: CT HEAD WITHOUT CONTRAST TECHNIQUE: Contiguous axial images were obtained from the base of the skull through the vertex without  intravenous contrast. COMPARISON:  02/29/2020 FINDINGS: Brain: There is no acute intracranial hemorrhage, mass effect, or edema. Gray-white differentiation is preserved. There is no extra-axial fluid collection. Ventricles and sulci are within normal limits in size and configuration. Vascular: There is atherosclerotic calcification at the skull base. Skull: Calvarium is unremarkable. Sinuses/Orbits: No acute finding. Other: None. IMPRESSION: No acute intracranial abnormality. Electronically Signed   By: Macy Mis M.D.   On: 07/19/2020 12:54   CT Angio Chest PE W/Cm &/Or Wo Cm  Result Date: 07/19/2020 CLINICAL DATA:  Mental status changes.  Lethargy. EXAM: CT ANGIOGRAPHY CHEST CT ABDOMEN AND PELVIS WITH CONTRAST TECHNIQUE: Multidetector CT imaging of the chest was performed using the standard protocol during bolus administration of intravenous contrast. Multiplanar CT image reconstructions and MIPs were obtained to evaluate the vascular anatomy. Multidetector CT imaging of the abdomen and pelvis was performed using the standard protocol during bolus administration of intravenous contrast. CONTRAST:  179m OMNIPAQUE IOHEXOL 350 MG/ML SOLN COMPARISON:  Abdomen and pelvis CT 05/21/2020 FINDINGS: CTA CHEST FINDINGS Cardiovascular: The heart is enlarged. No substantial pericardial effusion. Coronary artery calcification is evident. Atherosclerotic calcification is noted in the wall of the thoracic aorta. There is no filling defect within the opacified pulmonary arteries to suggest the presence of an acute pulmonary embolus. Mediastinum/Nodes: Mild mediastinal lymphadenopathy shows slight progression. 13 mm short axis subcarinal node was 9 mm previously. 12 mm short axis right paratracheal node on 86/7 with somewhat obscured previously but measured about 8 mm. No left hilar lymphadenopathy. No substantial lymphadenopathy in the right hilum. The esophagus has normal imaging features. There is no axillary  lymphadenopathy. Lungs/Pleura: Subsegmental atelectasis noted in the lungs bilaterally, basilar predominant. No overt pulmonary edema. No pleural effusion. No suspicious pulmonary nodule or mass. Musculoskeletal: No worrisome lytic or sclerotic osseous abnormality. Review of the MIP images confirms the above findings. CT ABDOMEN and PELVIS FINDINGS Hepatobiliary: Nodular hepatic contour again noted compatible with cirrhosis. Stable 8 mm subcapsular low-density lesion posterior right liver, too small to characterize but likely a cyst. Gallbladder is surgically absent with marked dilatation of the extrahepatic bile ducts. Common bile duct measures 14 mm diameter in the head of the pancreas, at the same level it measured 15 mm previously. Pancreas: No focal mass lesion. No dilatation of  the main duct. No intraparenchymal cyst. No peripancreatic edema. Spleen: 15.5 cm craniocaudal length, enlarged. Adrenals/Urinary Tract: No adrenal nodule or mass. Tiny hypodensities lower pole right kidney are too small to characterize but likely benign. Similar probably benign tiny hypodensity noted upper po no hydronephrosis No evidence for hydroureter. The urinary bladder appears normal for the degree of distention. Stomach/Bowel: Stomach is unremarkable. No gastric wall thickening. No evidence of outlet obstruction. Duodenum is normally positioned as is the ligament of Treitz. Jejunal loops have an appearance suggesting wall thickening but are nondilated. Terminal ileum unremarkable. Low-density wall thickening noted in the cecum. Colon is decompressed. Scattered diverticuli are seen in the left colon without diverticulitis. Vascular/Lymphatic: There is abdominal aortic atherosclerosis without aneurysm. 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 Marked venous collateralization noted in the left upper abdomen with features suggesting spleno renal shunt. There is no gastrohepatic  or hepatoduodenal ligament lymphadenopathy. No retroperitoneal or mesenteric lymphadenopathy. No pelvic sidewall lymphadenopathy. Reproductive: The uterus is unremarkable.  There is no adnexal mass. Other: Mesenteric edema is associated with small volume free fluid in the pelvis. Multiple gas bubbles are seen in the extraperitoneal left pelvic floor, posterior to the bladder and adjacent to the vagina. These have a curvilinear/serpiginous character in some areas raising the question of gas within venous anatomy. No soft tissue or vascular gas elsewhere in the pelvis. As above, there is no evidence of diverticulitis. Musculoskeletal: Diffuse body wall edema. No worrisome lytic or sclerotic osseous abnormality. Review of the MIP images confirms the above findings. IMPRESSION: 1. No CT evidence for acute pulmonary embolus. 2. Mild mediastinal lymphadenopathy, potentially reactive. 3. 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 Splenomegaly with extensive collateralization noted in the left abdomen and spleno renal shunt anatomy is consistent with portal venous hypertension. 4. Low-density wall thickening in the cecum with apparent wall thickening and potential edema in proximal small bowel loops. Infectious/inflammatory etiology a consideration although this could also be seen in the setting of systemic disease (hypoproteinemia). 5. Multiple tiny gas bubbles are seen in the extraperitoneal left pelvic floor, posterior to the bladder and adjacent to the vagina. These have a curvilinear/serpiginous character in some areas raising the question of gas within venous anatomy. No soft tissue or vascular gas elsewhere in the pelvis. No findings to explain the presence of this gas. Follow-up could be used to ensure resolution as clinical picture dictates. 6. Marked dilatation of the extrahepatic bile ducts status post cholecystectomy. This may be related to the prior cholecystectomy and is similar to  prior studies. Correlation with liver function test may prove helpful. 7. Diffuse body wall edema with small volume free fluid in the pelvis. 8. Aortic Atherosclerosis (ICD10-I70.0). Electronically Signed   By: Misty Stanley M.D.   On: 07/19/2020 13:17   CT Abdomen Pelvis W Contrast  Result Date: 07/19/2020 CLINICAL DATA:  Mental status changes.  Lethargy. EXAM: CT ANGIOGRAPHY CHEST CT ABDOMEN AND PELVIS WITH CONTRAST TECHNIQUE: Multidetector CT imaging of the chest was performed using the standard protocol during bolus administration of intravenous contrast. Multiplanar CT image reconstructions and MIPs were obtained to evaluate the vascular anatomy. Multidetector CT imaging of the abdomen and pelvis was performed using the standard protocol during bolus administration of intravenous contrast. CONTRAST:  140m OMNIPAQUE IOHEXOL 350 MG/ML SOLN COMPARISON:  Abdomen and pelvis CT 05/21/2020 FINDINGS: CTA CHEST FINDINGS Cardiovascular: The heart is enlarged. No substantial pericardial effusion. Coronary artery calcification is evident. Atherosclerotic  calcification is noted in the wall of the thoracic aorta. There is no filling defect within the opacified pulmonary arteries to suggest the presence of an acute pulmonary embolus. Mediastinum/Nodes: Mild mediastinal lymphadenopathy shows slight progression. 13 mm short axis subcarinal node was 9 mm previously. 12 mm short axis right paratracheal node on 86/7 with somewhat obscured previously but measured about 8 mm. No left hilar lymphadenopathy. No substantial lymphadenopathy in the right hilum. The esophagus has normal imaging features. There is no axillary lymphadenopathy. Lungs/Pleura: Subsegmental atelectasis noted in the lungs bilaterally, basilar predominant. No overt pulmonary edema. No pleural effusion. No suspicious pulmonary nodule or mass. Musculoskeletal: No worrisome lytic or sclerotic osseous abnormality. Review of the MIP images confirms the above  findings. CT ABDOMEN and PELVIS FINDINGS Hepatobiliary: Nodular hepatic contour again noted compatible with cirrhosis. Stable 8 mm subcapsular low-density lesion posterior right liver, too small to characterize but likely a cyst. Gallbladder is surgically absent with marked dilatation of the extrahepatic bile ducts. Common bile duct measures 14 mm diameter in the head of the pancreas, at the same level it measured 15 mm previously. Pancreas: No focal mass lesion. No dilatation of the main duct. No intraparenchymal cyst. No peripancreatic edema. Spleen: 15.5 cm craniocaudal length, enlarged. Adrenals/Urinary Tract: No adrenal nodule or mass. Tiny hypodensities lower pole right kidney are too small to characterize but likely benign. Similar probably benign tiny hypodensity noted upper po no hydronephrosis No evidence for hydroureter. The urinary bladder appears normal for the degree of distention. Stomach/Bowel: Stomach is unremarkable. No gastric wall thickening. No evidence of outlet obstruction. Duodenum is normally positioned as is the ligament of Treitz. Jejunal loops have an appearance suggesting wall thickening but are nondilated. Terminal ileum unremarkable. Low-density wall thickening noted in the cecum. Colon is decompressed. Scattered diverticuli are seen in the left colon without diverticulitis. Vascular/Lymphatic: There is abdominal aortic atherosclerosis without aneurysm. 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 Marked venous collateralization noted in the left upper abdomen with features suggesting spleno renal shunt. There is no gastrohepatic or hepatoduodenal ligament lymphadenopathy. No retroperitoneal or mesenteric lymphadenopathy. No pelvic sidewall lymphadenopathy. Reproductive: The uterus is unremarkable.  There is no adnexal mass. Other: Mesenteric edema is associated with small volume free fluid in the pelvis. Multiple gas bubbles are seen  in the extraperitoneal left pelvic floor, posterior to the bladder and adjacent to the vagina. These have a curvilinear/serpiginous character in some areas raising the question of gas within venous anatomy. No soft tissue or vascular gas elsewhere in the pelvis. As above, there is no evidence of diverticulitis. Musculoskeletal: Diffuse body wall edema. No worrisome lytic or sclerotic osseous abnormality. Review of the MIP images confirms the above findings. IMPRESSION: 1. No CT evidence for acute pulmonary embolus. 2. Mild mediastinal lymphadenopathy, potentially reactive. 3. 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 Splenomegaly with extensive collateralization noted in the left abdomen and spleno renal shunt anatomy is consistent with portal venous hypertension. 4. Low-density wall thickening in the cecum with apparent wall thickening and potential edema in proximal small bowel loops. Infectious/inflammatory etiology a consideration although this could also be seen in the setting of systemic disease (hypoproteinemia). 5. Multiple tiny gas bubbles are seen in the extraperitoneal left pelvic floor, posterior to the bladder and adjacent to the vagina. These have a curvilinear/serpiginous character in some areas raising the question of gas within venous anatomy. No soft tissue or vascular gas elsewhere in the pelvis.  No findings to explain the presence of this gas. Follow-up could be used to ensure resolution as clinical picture dictates. 6. Marked dilatation of the extrahepatic bile ducts status post cholecystectomy. This may be related to the prior cholecystectomy and is similar to prior studies. Correlation with liver function test may prove helpful. 7. Diffuse body wall edema with small volume free fluid in the pelvis. 8. Aortic Atherosclerosis (ICD10-I70.0). Electronically Signed   By: Misty Stanley M.D.   On: 07/19/2020 13:17   DG Chest Port 1 View  Result Date: 07/19/2020 CLINICAL  DATA:  Possible sepsis, altered mental status EXAM: PORTABLE CHEST 1 VIEW COMPARISON:  07/12/2020 FINDINGS: Similar cardiomediastinal contours. Chronic interstitial prominence with pulmonary vascular congestion. No significant pleural effusion. No pneumothorax. IMPRESSION: Chronic interstitial prominence with vascular congestion. Electronically Signed   By: Macy Mis M.D.   On: 07/19/2020 11:12        Scheduled Meds: . Chlorhexidine Gluconate Cloth  6 each Topical Q0600  . feeding supplement  237 mL Oral BID BM  . gabapentin  200 mg Oral TID  . insulin aspart  0-15 Units Subcutaneous TID WC  . insulin aspart  0-5 Units Subcutaneous QHS  . lactulose  30 g Oral TID  . levothyroxine  50 mcg Oral QAC breakfast  . lipase/protease/amylase  24,000 Units Oral TID AC  . mupirocin ointment  1 application Nasal BID  . rifaximin  550 mg Oral BID   Continuous Infusions: . sodium chloride Stopped (07/20/20 0529)  . piperacillin-tazobactam 3.375 g (07/20/20 1219)  . piperacillin-tazobactam (ZOSYN)  IV    . vancomycin Stopped (07/20/20 0512)     LOS: 1 day    Time spent: 65 min    Modesta Messing, MS4 [facility]   To contact the attending provider between 7A-7P or the covering provider during after hours 7P-7A, please log into the web site [email or web address] and access using universal [place] password for that web site. If you do not have the password, please call the hospital operator.  07/20/2020, 12:23 PM

## 2020-07-20 NOTE — Progress Notes (Signed)
Pt called out for RN stating "I am having a panic attack". RN to room, pt attempting to get out of bed stating "I need to get up, I can't breathe". RN calmed patient and asked her to sit back in bed. Pt coughing and crying. RN educated patient on coughing and deep breathing and increased oxygen to 5 liters. Pt has progressively become more anxious this evening. Pt requested breathing treatment.

## 2020-07-20 NOTE — Progress Notes (Signed)
RN from dayshift informed me that she was about to call me for patient.  Stated patient was having difficulty breathing and had a PRN that she could get.  I listed to BS, there was a slight wheeze in RLL and clear and diminished everywhere else.  Patient is normally on 2 to 3L at home and has been on 3L here but RN had to go up on flow for patient. Giving PRN and will continue to monitor.

## 2020-07-20 NOTE — Progress Notes (Addendum)
Initial Nutrition Assessment  DOCUMENTATION CODES:   Non-severe (moderate) malnutrition in context of chronic illness  INTERVENTION:  Ensure Enlive po BID, each supplement provides 350 kcal and 20 grams of protein. Patient prefers [employer].   ProSource Plus BID between meals (200 kcal, 30 gr protein) daily   Heart Healthy / CHO modified diet  NUTRITION DIAGNOSIS:   Moderate Malnutrition related to chronic illness (alchololic cirrhosis, pancreatitis) as evidenced by mild muscle depletion,mild fat depletion,energy intake < 75% for > or equal to 1 month.   GOAL:  Patient will meet greater than or equal to 90% of their needs  MONITOR:  PO intake,Supplement acceptance,Labs,I & O's,Weight trends,Skin  REASON FOR ASSESSMENT:   Malnutrition Screening Tool    ASSESSMENT: Patient is a chronically ill 55 yo. Hx of DM2, Alcoholic Cirrhosis, Pancreatitis and chronic nausea. She presents with altered mental status and acute UTI.   Patient is awake and family member present but sleeping in room. Meal intake breakfast 50%. Her lunch tray is here but untouched and she is not hungry. At home pt supplements with nutrition shakes and usually eats 1-2 meals daily. Patient says at times she will go as long as 2-3 days without eating. She has peanut butter cheese crackers here that family brought. Tearful as she talks about the state of her health.   Patient weight has fluctuated between 55-63 kg the past 4 months. She reports fluid status changes and nursing reports upper extremity edema.   Medications: Lactulose, Creon (24k units TID), Insulin  IVF- NS@ 100 ml/hr. Drips: Antibiotics and Albumin   Labs reviewed: Glucose 109, Hgb, 7.6 (L), B-12:  611 wnl, Iron- 67 wnl, ALb. 2.5 (L).  NUTRITION - FOCUSED PHYSICAL EXAM: Nutrition-Focused physical exam completed. Findings are mild upper arms and buccal fat depletion, mild muscle depletion clavicle, clavicle /acoromion region, and bilateral  upper extremity mild edema.     Diet Order:   Diet Order            Diet heart healthy/carb modified Room service appropriate? Yes; Fluid consistency: Thin  Diet effective now                 EDUCATION NEEDS:  Education needs have been addressed  Skin:  Skin Assessment: Skin Integrity Issues: Skin Integrity Issues:: Stage II Stage II: buttock  Last BM:  5/2 type 6  Height:   Ht Readings from Last 1 Encounters:  07/19/20 5' 2"  (1.575 m)    Weight:   Wt Readings from Last 1 Encounters:  07/19/20 59 kg    Ideal Body Weight:   50 kg  BMI:  Body mass index is 23.78 kg/m.  Estimated Nutritional Needs:   Kcal:  1700-1900  Protein:  88-95 gr  Fluid:  > 1500 ml daily   Colman Cater MS,RD,CSG,LDN Contact: [email or web address]

## 2020-07-20 NOTE — Progress Notes (Signed)
Pharmacy Antibiotic Note  [name] is a 55 y.o. female admitted on 07/19/2020 with sepsis.  Pharmacy has been consulted for Zosyn and vancomycin dosing.  Plan: Zosyn 3.375g IV every 8 hours. Vancomycin 750 mg IV every 12 hours. Monitor labs, c/s, and vanco level as indicated.  Height: 5' 2"  (157.5 cm) Weight: 59 kg (130 lb) IBW/kg (Calculated) : 50.1  Temp (24hrs), Avg:99.5 F (37.5 C), Min:97.7 F (36.5 C), Max:102.3 F (39.1 C)  Recent Labs  Lab 07/19/20 1008 07/19/20 1151 07/19/20 1253 07/19/20 1455 07/20/20 0508 07/20/20 0635  WBC 4.8  --   --   --   --  2.6*  CREATININE 0.39*  --   --   --  0.58  --   LATICACIDVEN 2.0* 2.8* 3.2* 3.2* 1.2  --     Estimated Creatinine Clearance: 61.4 mL/min (by C-G formula based on SCr of 0.58 mg/dL).    Allergies  Allergen Reactions  . Acetaminophen Hives and Other (See Comments)    Tolerates Fioricet  . Cephalexin Itching, Swelling and Other (See Comments)    Tongue swells and makes throat itchy- can tolerate Zosyn  . Ciprofloxacin Hives  . Hydromorphone Hives  . Sulfamethoxazole-Trimethoprim Shortness Of Breath, Swelling and Other (See Comments)    Tongue swells   . Abilify [Aripiprazole]     "I did noyt like the way this made me feel."    Antimicrobials this admission: 5/1 aztreonam >> 5/2 Zosyn 5/2 >> 5/1 vancomycin >>  5/1 metronidazole >> 5/2  Microbiology results: 5/1 BCx: ngtd 5/1 UCx: sent     Thank you for allowing pharmacy to be a part of this patient's care.  Ramond Craver 07/20/2020 11:18 AM

## 2020-07-20 NOTE — TOC Initial Note (Addendum)
Transition of Care [facility]) - Initial/Assessment Note    Patient Details  Name: [name] MRN: [id number] Date of Birth: [birth year]  [facility]) CM/SW Contact:    Salome Arnt, LCSW Phone Number: 07/20/2020, 11:01 AM  Clinical Narrative:   Pt admitted due to possible sepsis. TOC completed assessment due to high risk readmission score. Pt reports she lives with her significant other and her nephew. She indicates on a good day she needs assist with dressing and bathing, but on a bad day she will need help with all ADLs. Pt primarily uses a walker, but also has a cane and wheelchair. Pt has home O2 through [employer]. She plans to return home when medically stable. Discussed possibility of home health. Pt states she would rather have outpatient PT if follow up is needed and requests [facility] in [place]. She also requests [facility]ator. Will refer when appropriate. TOC will continue to follow.                    Barriers to Discharge: Continued Medical Work up   Patient Goals and CMS Choice Patient states their goals for this hospitalization and ongoing recovery are:: return home   Choice offered to / list presented to : Patient  Expected Discharge Plan and Services   In-house Referral: Clinical Social Work     Living arrangements for the past 2 months: Single Family Home                                      Prior Living Arrangements/Services Living arrangements for the past 2 months: Single Family Home Lives with:: Relatives,Significant Other Patient language and need for interpreter reviewed:: Yes Do you feel safe going back to the place where you live?: Yes      Need for Family Participation in Patient Care: Yes (Comment) Care giver support system in place?: Yes (comment) Current home services: DME (walker, cane, wheelchair, BSC, grab bars, ramp, home O2) Criminal Activity/Legal Involvement Pertinent to Current  Situation/Hospitalization: No - Comment as needed  Activities of Daily Living Home Assistive Devices/Equipment: None ADL Screening (condition at time of admission) Patient's cognitive ability adequate to safely complete daily activities?: Yes Is the patient deaf or have difficulty hearing?: No Does the patient have difficulty seeing, even when wearing glasses/contacts?: No Does the patient have difficulty concentrating, remembering, or making decisions?: Yes Patient able to express need for assistance with ADLs?: Yes Does the patient have difficulty dressing or bathing?: Yes Independently performs ADLs?: No Communication: Independent Dressing (OT): Needs assistance Is this a change from baseline?: Pre-admission baseline Grooming: Needs assistance Is this a change from baseline?: Pre-admission baseline Feeding: Independent Bathing: Needs assistance Is this a change from baseline?: Pre-admission baseline Toileting: Needs assistance Is this a change from baseline?: Pre-admission baseline In/Out Bed: Needs assistance Is this a change from baseline?: Pre-admission baseline Walks in Home: Needs assistance Does the patient have difficulty walking or climbing stairs?: No Weakness of Legs: Both Weakness of Arms/Hands: None  Permission Sought/Granted                  Emotional Assessment   Attitude/Demeanor/Rapport: Engaged Affect (typically observed): Accepting Orientation: : Oriented to Self,Oriented to Place,Oriented to  Time,Oriented to Situation Alcohol / Substance Use: Not Applicable Psych Involvement: No (comment)  Admission diagnosis:  Altered mental status [R41.82] Sepsis with encephalopathy without septic shock, due  to unspecified organism ([place]) [A41.9, R65.20, G93.40] Patient Active Problem List   Diagnosis Date Noted  . Altered mental status 07/19/2020  . Sepsis ([place]) 07/19/2020  . Acute lower UTI 07/19/2020  . DNR (do not resuscitate) 07/19/2020  . Pancreatic  insufficiency 07/19/2020  . Chronic liver failure without hepatic coma ([place])   . Physical deconditioning   . Hepatic encephalopathy ([place]) 05/21/2020  . Anemia   . Coagulopathy ([place])   . Ascites 03/30/2020  . Abnormal weight gain 03/30/2020  . Severe recurrent major depressive disorder with psychotic features ([place]) 10/05/2019  . Major depressive disorder, single episode, severe with psychotic features ([place]) 10/04/2019  . Abnormal head CT 04/02/2019  . Multiple falls 04/02/2019  . Stroke-like symptoms 04/02/2019  . Panic attack 03/30/2019  . History of COPD 03/20/2019  . Chronic abdominal pain 02/05/2019  . DKA (diabetic ketoacidoses) 01/26/2019  . Sleep apnea   . Gastroesophageal reflux   . Hepatitis A 01/13/2019  . 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[place]) 01/12/2019  . COVID-19 01/12/2019  . Glycosuria 01/12/2019  . Hyperbilirubinemia 01/12/2019  . Hypoalbuminemia 01/12/2019  . Normocytic anemia, not due to blood loss 01/12/2019  . Uncontrolled type 2 diabetes mellitus with hyperglycemia, with long-term current use of insulin ([place]) 01/12/2019  . Low TSH level 01/12/2019  . Chest pain 12/16/2018  . Left-sided weakness 12/16/2018  . Weight loss 12/16/2018  . Schizoaffective disorder ([place]) 03/10/2018  . Essential hypertension 03/09/2018  . H/O partial thyroidectomy 10/25/2017  . Thyroid nodule 10/25/2017  . Cricopharyngeal hypertrophy 09/26/2017  . Pharyngoesophageal dysphagia 09/26/2017  . Reaction to severe stress 09/11/2017  . Left hemiparesis ([place]) 04/18/2017  . Disorder of shoulder 03/03/2017  . Lactic acidosis 09/27/2016  . Pancytopenia ([place]) 09/27/2016  . Acquired hypothyroidism 09/27/2016  . Microalbuminuria due to type 2 diabetes mellitus ([place]) 04/20/2016  . Diabetic peripheral neuropathy associated with type 2 diabetes mellitus ([place]) 12/03/2015  . Hepatic steatosis 12/03/2015  . Diabetes mellitus without complication ([place]) 88/82/8003  . Moderate episode  of recurrent major depressive disorder ([place]) 03/20/2015  . Complicated UTI (urinary tract infection) 12/25/2014  . Vaginitis and vulvovaginitis 11/06/2014  . Blurry vision, bilateral 10/17/2014  . Health care maintenance 10/17/2014  . Nausea 10/17/2014  . Multinodular thyroid 07/01/2014  . Cirrhosis of liver ([place]) 03/05/2014  . Postoperative hypothyroidism 03/05/2014  . Type 2 diabetes mellitus ([place]) 03/05/2014  . Thrombocytopenia ([place]) 01/17/2014  . Alcoholic cirrhosis of liver without ascites ([place]) 01/17/2014  . Back problem 05/28/2013  . Low back pain 04/12/2013  . Borderline personality disorder ([place]) 08/25/2012  . Depression 08/25/2012  . Hypercholesterolemia 08/25/2012  . Osteoarthrosis 08/25/2012  . Gastro-esophageal reflux disease without esophagitis 08/25/2012  . Obstructive sleep apnea 08/25/2012   PCP:  [facility] [place]:   [place], [place] [address] [place] [address] Phone: [phone] Fax: [phone]     Social Determinants of Health (SDOH) Interventions    Readmission Risk Interventions Readmission Risk Prevention Plan 07/20/2020  Transportation Screening Complete  HRI or Home Care Consult Complete  Social Work Consult for [place] Planning/Counseling Complete  Palliative Care Screening Not Applicable  Medication Review Press photographer) Complete  Some recent data might be hidden

## 2020-07-21 ENCOUNTER — Inpatient Hospital Stay (HOSPITAL_COMMUNITY): Payer: Medicare HMO

## 2020-07-21 DIAGNOSIS — R4182 Altered mental status, unspecified: Secondary | ICD-10-CM | POA: Diagnosis not present

## 2020-07-21 DIAGNOSIS — E440 Moderate protein-calorie malnutrition: Secondary | ICD-10-CM | POA: Insufficient documentation

## 2020-07-21 LAB — CBC
HCT: 26.1 % — ABNORMAL LOW (ref 36.0–46.0)
Hemoglobin: 8.5 g/dL — ABNORMAL LOW (ref 12.0–15.0)
MCH: 34.7 pg — ABNORMAL HIGH (ref 26.0–34.0)
MCHC: 32.6 g/dL (ref 30.0–36.0)
MCV: 106.5 fL — ABNORMAL HIGH (ref 80.0–100.0)
Platelets: 34 10*3/uL — ABNORMAL LOW (ref 150–400)
RBC: 2.45 MIL/uL — ABNORMAL LOW (ref 3.87–5.11)
RDW: 16.2 % — ABNORMAL HIGH (ref 11.5–15.5)
WBC: 3.5 10*3/uL — ABNORMAL LOW (ref 4.0–10.5)
nRBC: 0 % (ref 0.0–0.2)

## 2020-07-21 LAB — GLUCOSE, CAPILLARY
Glucose-Capillary: 160 mg/dL — ABNORMAL HIGH (ref 70–99)
Glucose-Capillary: 164 mg/dL — ABNORMAL HIGH (ref 70–99)
Glucose-Capillary: 352 mg/dL — ABNORMAL HIGH (ref 70–99)
Glucose-Capillary: 241 mg/dL — ABNORMAL HIGH (ref 70–99)

## 2020-07-21 LAB — COMPREHENSIVE METABOLIC PANEL
ALT: 16 U/L (ref 0–44)
AST: 55 U/L — ABNORMAL HIGH (ref 15–41)
Albumin: 2.9 g/dL — ABNORMAL LOW (ref 3.5–5.0)
Alkaline Phosphatase: 66 U/L (ref 38–126)
Anion gap: 5 (ref 5–15)
BUN: 19 mg/dL (ref 6–20)
CO2: 24 mmol/L (ref 22–32)
Calcium: 8.3 mg/dL — ABNORMAL LOW (ref 8.9–10.3)
Chloride: 110 mmol/L (ref 98–111)
Creatinine, Ser: 0.48 mg/dL (ref 0.44–1.00)
GFR, Estimated: >60 mL/min (ref >60)
Glucose, Bld: 154 mg/dL — ABNORMAL HIGH (ref 70–99)
Potassium: 3.7 mmol/L (ref 3.5–5.1)
Sodium: 139 mmol/L (ref 135–145)
Total Bilirubin: 6.2 mg/dL — ABNORMAL HIGH (ref 0.3–1.2)
Total Protein: 5.4 g/dL — ABNORMAL LOW (ref 6.5–8.1)

## 2020-07-21 LAB — PROTIME-INR
INR: 2.1 — ABNORMAL HIGH (ref 0.8–1.2)
Prothrombin Time: 23.6 seconds — ABNORMAL HIGH (ref 11.4–15.2)

## 2020-07-21 LAB — MAGNESIUM: Magnesium: 1.8 mg/dL (ref 1.7–2.4)

## 2020-07-21 LAB — PROCALCITONIN: Procalcitonin: 3.78 ng/mL

## 2020-07-21 MED ORDER — ALPRAZOLAM 0.5 MG PO TABS
0.5000 mg | ORAL_TABLET | Freq: Three times a day (TID) | ORAL | Status: DC | PRN
  Administered 2020-07-21 – 2020-07-23 (×4): 0.5 mg via ORAL
  Filled 2020-07-21 (×4): qty 1

## 2020-07-21 MED ORDER — METHYLPREDNISOLONE SODIUM SUCC 125 MG IJ SOLR
125.0000 mg | Freq: Once | INTRAMUSCULAR | Status: AC
  Administered 2020-07-21: 125 mg via INTRAVENOUS
  Filled 2020-07-21: qty 2

## 2020-07-21 MED ORDER — GUAIFENESIN-DM 100-10 MG/5ML PO SYRP
5.0000 mL | ORAL_SOLUTION | ORAL | Status: DC | PRN
  Administered 2020-07-21: 5 mL via ORAL
  Filled 2020-07-21: qty 5

## 2020-07-21 MED ORDER — ALBUTEROL (5 MG/ML) CONTINUOUS INHALATION SOLN
5.0000 mg/h | INHALATION_SOLUTION | RESPIRATORY_TRACT | Status: DC
  Filled 2020-07-21: qty 20

## 2020-07-21 MED ORDER — SALINE SPRAY 0.65 % NA SOLN
1.0000 | NASAL | Status: DC | PRN
  Administered 2020-07-21: 1 via NASAL
  Filled 2020-07-21: qty 44

## 2020-07-21 MED ORDER — MORPHINE SULFATE (PF) 4 MG/ML IV SOLN
4.0000 mg | Freq: Once | INTRAVENOUS | Status: AC
  Administered 2020-07-21: 4 mg via INTRAVENOUS
  Filled 2020-07-21: qty 1

## 2020-07-21 NOTE — Progress Notes (Signed)
PROGRESS NOTE    [name]  AJG:[id number] DOB: Aug 01, 1965 DOA: 07/19/2020 PCP: [facility] [place]   Chief Complaint  Patient presents with  . Altered Mental Status    Brief Narrative: [name] is a 55 y.o. female with medical history significant of cirrhosis, insulin-dependent diabetes on insulin pump, chronic pancreatitis, thrombocytopenia (usually in the 60s), and hypothyroidism who was admitted on 5/1 for hepatic encephalopathy. See HPI and H&P for details. On admission, pt continued to have confusion and noted new-onset neck pain. Mentation has improved, in that she is alert, oriented with choices, and able to follow commands albeit slowly processing before responding. Pt has ongoing anxiety/panic attacks regarding respiration.  Assessment & Plan:   Active Problems:   Thrombocytopenia (HCC)   Alcoholic cirrhosis of liver without ascites (HCC)   Essential hypertension   Acquired hypothyroidism   Type 2 diabetes mellitus ([place])   Coagulopathy (HCC)   Altered mental status   Sepsis ([place])   Acute lower UTI   DNR (do not resuscitate)   Pancreatic insufficiency   Sepsis secondary to UTI, no bacteremia - On presentation, 102.5 F, HR 110, AMS, sepsis protocol initiated      - CXR no signs of obvious PNA      - CT Abdomen does not show any foci of infection      - No ascites or acute abdomen on exam, making SBP or GI etiology less likely - Pt continues to complain of neck pain, though passive neck flexion and psoas signs continue to be negative, AMS is improving, and she remains afebrile - making meningitis less likely.      - Avoid LP for now - Pt currently afebrile - UCx revealed >100k GNR @1d       - d/c Vanc, continue Zosyn      - Tailor/change Abx once UCx sensitivities return - BCx revealed no growth @ 2d, continue to follow - Continue to monitor fevers or fluctuations in mental status  Acute Metabolic Encephalopathy - Given unremarkable imaging and  roughly baseline ammonia levels, AMS appears to be from fever and sepsis - As fever is improving, mental status is improving.  - She is now roughly at her baseline - Now having episodes of acute anxiety regarding fears ventilation - Continue to monitor  Anxiety Attack, first noted hospital day 2 - As mental status resolved, pt stated "I'm having a panic attack and I can't hold my breath" - Pt stated she had a "bad experience with ventilators" during a hospitalization last Fall, and she was having flashbacks - During episode she became hypertensive and tachycardic, requiring 12L O2 compared to 2-3L at home and 3-5L on admission - PRN Xanax calmed patient, and vitals were stable on 6L O2 - Continue PRN Xanax  Thrombocytopenia / Elevated INR 2/2 Cirrhosis - Per prior records, baseline low 2/2 cirrhosis - Platelets 48 on admission, dropped hospital day 1 to 32 and has been stable at 34 - No clinical signs of coagulopathy (purpura, petechiae, ecchymoses) - Continue to trend PPT and INR  Anemia - Hb on admission 10.8, now 8.5 - Etiology is likely mixed given workup:       - Macrocytic               - B12 and Folate WNL                - Retic count high with elevated % Immature (ddx includes destruction/sequestration/loss of RBCs)       -  Ferritin nl, Fe nl, TIBC low, Saturation Ratio high (ddx includes anemia of chronic disease)       - Pain with RUQ palpation (ddx includes splenic sequestration)       - FOBT positive (ddx includes RBC loss)       - Bilirubin elevated in serum and urine (ddx includes RBC destruction)               - Direct Bilirubin, LDH, and Haptoglobin ordered to differentiate RBC destruction from hepatocyte dysfunction - Continue to trend with daily CBCs  Cirrhosis of Liver - Holding home diuretics in setting of possible sepsis - Continue home xifaxan and lactulose to prevent hepatic encephalopathy  Lactic Acidosis - Elevated to 3.2 on admission, quickly  resolved. - Continue to trend  DM2 - Chronically on insulin pump, hold while admitted - Hold home metformin - Continue SSI and consider starting basal meds - Continue home gabapentin for peripheral neuropathy  Hypothyroidism - Continue home levothyroxine  Pancreatic Insufficiency, likely 2/2 Chronic Pancreatitis - Continue home Creon during meals   DVT prophylaxis: SCDs Code Status: DNR Family Communication: (Specify name, relationship & date discussed. NO "discussed with patient") Disposition:   Status is: Inpatient, Stepdown  Remains inpatient appropriate because:Altered mental status and Thrombocytopenia   Dispo: The patient is from: Home              Anticipated d/c is to: Home              Patient currently is not medically stable to d/c.   Difficult to place patient No       Consultants:   None at this time  Procedures:   None at this time  Antimicrobials:  Zosyn   Subjective: [name] is a 55 y.o. female with medical history significant of cirrhosis, insulin-dependent diabetes on insulin pump, chronic pancreatitis, thrombocytopenia (usually in the 60s), and hypothyroidism who was admitted on 5/1 for hepatic encephalopathy. Briefly, the week before admission she felt generally unwell, noted darker and more foul-swelling urine but no dysuria, and the night before admission developed lethargy, confusion, fevers, chills, nausea, and nonbilious vomiting. In the ED, pt had no memory of the prior evening, continued to have AMS, and was mildly tachycardic. Other ssx, including bowel pain, SOB, urinary frequency and diarrhea 2/2 lactulose therapy were unchanged from her baseline. CT head unremarkable for acute processes, CTA chest r/o PE, and CT Abdomen/Pelvis mentioned scattered areas of thickening.   5/1 - On admission, pt continued to have confusion and noted new-onset neck pain.  5/2 - Mentation has improved, in that she is alert, oriented with choices,  and able to follow commands albeit slowly processing before responding. SOB has decreased and pt feels more comfortable.  5/3 - Mentation almost back to baseline, though pt had an "anxiety attack" regarding ventilation, requiring Xanax before calming down.  Objective: Vitals:   07/21/20 0800 07/21/20 0803 07/21/20 0900 07/21/20 1000  BP:      Pulse: (!) 106 99 68 85  Resp: (!) 28 (!) 29 (!) 21 16  Temp:  97.6 F (36.4 C)    TempSrc:  Oral    SpO2: (!) 89% 90% 95% (!) 86%  Weight:      Height:        Intake/Output Summary (Last 24 hours) at 07/21/2020 1047 Last data filed at 07/21/2020 0900 Gross per 24 hour  Intake 1087.91 ml  Output --  Net 1087.91 ml   [employer]  07/19/20 0951  Weight: 59 kg    Examination:  General exam: Appears calm and comfortable. Marked increase in mentation, almost baseline Respiratory system: Clear to auscultation. Respiratory effort normal. Cardiovascular system: S1 & S2 heard, RRR. No JVD, murmurs, rubs, gallops or clicks. No pedal edema. Gastrointestinal system: Abdomen is tender to light palpation in LUQ and RUQ. Normal bowel sounds heard. Central nervous system: Alert and oriented x3. Sensation to light touch on L side grossly diminished, per her baseline. Passive flexion of neck negative for pain, Psoas sign negative bilaterally. Extremities: Symmetric 5 x 5 power. Skin: No lesions or ulcers. No petechiae, purpura, or ecchymoses. Psychiatry: Judgement and insight appear normal. Mood & affect appropriate. Slow to respond.    Data Reviewed:   CBC: Recent Labs  Lab 07/19/20 1008 07/20/20 0635 07/21/20 0611  WBC 4.8 2.6* 3.5*  NEUTROABS 4.2  --   --   HGB 10.8* 7.6* 8.5*  HCT 32.9* 23.2* 26.1*  MCV 104.1* 106.4* 106.5*  PLT 48* 32* 34*    Basic Metabolic Panel: Recent Labs  Lab 07/19/20 1008 07/20/20 0508 07/21/20 0611  NA 140 139 139  K 3.5 3.7 3.7  CL 108 109 110  CO2 26 26 24   GLUCOSE 116* 109* 154*  BUN 12 18 19    CREATININE 0.39* 0.58 0.48  CALCIUM 8.3* 7.8* 8.3*  MG  --   --  1.8    GFR: Estimated Creatinine Clearance: 61.4 mL/min (by C-G formula based on SCr of 0.48 mg/dL).  Liver Function Tests: Recent Labs  Lab 07/19/20 1008 07/20/20 0508 07/21/20 0611  AST 59* 42* 55*  ALT 22 16 16   ALKPHOS 102 64 66  BILITOT 6.6* 6.1* 6.2*  PROT 5.7* 5.0* 5.4*  ALBUMIN 2.3* 2.5* 2.9*    CBG: Recent Labs  Lab 07/20/20 0725 07/20/20 1114 07/20/20 1611 07/20/20 2239 07/21/20 0802  GLUCAP 95 178* 265* 207* 160*     Recent Results (from the past 240 hour(s))  Resp Panel by RT-PCR (Flu A&B, Covid) Nasopharyngeal Swab     Status: None   Collection Time: 07/19/20  9:59 AM   Specimen: Nasopharyngeal Swab; Nasopharyngeal(NP) swabs in vial transport medium  Result Value Ref Range Status   SARS Coronavirus 2 by RT PCR NEGATIVE NEGATIVE Final    Comment: (NOTE) SARS-CoV-2 target nucleic acids are NOT DETECTED.  The SARS-CoV-2 RNA is generally detectable in upper respiratory specimens during the acute phase of infection. The lowest concentration of SARS-CoV-2 viral copies this assay can detect is 138 copies/mL. A negative result does not preclude SARS-Cov-2 infection and should not be used as the sole basis for treatment or other patient management decisions. A negative result may occur with  improper specimen collection/handling, submission of specimen other than nasopharyngeal swab, presence of viral mutation(s) within the areas targeted by this assay, and inadequate number of viral copies(<138 copies/mL). A negative result must be combined with clinical observations, patient history, and epidemiological information. The expected result is Negative.  Fact Sheet for Patients:  EntrepreneurPulse.com.au  Fact Sheet for Healthcare Providers:  IncredibleEmployment.be  This test is no t yet approved or cleared by the [place] FDA and  has been  authorized for detection and/or diagnosis of SARS-CoV-2 by FDA under an Emergency Use Authorization (EUA). This EUA will remain  in effect (meaning this test can be used) for the duration of the COVID-19 declaration under Section 564(b)(1) of the Act, 21 U.S.C.section 360bbb-3(b)(1), unless the authorization is terminated  or revoked sooner.  Influenza A by PCR NEGATIVE NEGATIVE Final   Influenza B by PCR NEGATIVE NEGATIVE Final    Comment: (NOTE) The Xpert Xpress SARS-CoV-2/FLU/RSV plus assay is intended as an aid in the diagnosis of influenza from Nasopharyngeal swab specimens and should not be used as a sole basis for treatment. Nasal washings and aspirates are unacceptable for Xpert Xpress SARS-CoV-2/FLU/RSV testing.  Fact Sheet for Patients: EntrepreneurPulse.com.au  Fact Sheet for Healthcare Providers: IncredibleEmployment.be  This test is not yet approved or cleared by the [place] FDA and has been authorized for detection and/or diagnosis of SARS-CoV-2 by FDA under an Emergency Use Authorization (EUA). This EUA will remain in effect (meaning this test can be used) for the duration of the COVID-19 declaration under Section 564(b)(1) of the Act, 21 U.S.C. section 360bbb-3(b)(1), unless the authorization is terminated or revoked.  Performed at [facility], [address] [place], [place] [address]   Blood Culture (routine x 2)     Status: None (Preliminary result)   Collection Time: 07/19/20 10:10 AM   Specimen: BLOOD RIGHT FOREARM  Result Value Ref Range Status   Specimen Description   Final    BLOOD RIGHT FOREARM BOTTLES DRAWN AEROBIC AND ANAEROBIC   Special Requests Blood Culture adequate volume  Final   Culture   Final    NO GROWTH 2 DAYS Performed at [facility], [address] [place], [place] [address]    Report Status PENDING  Incomplete  Blood Culture (routine x 2)     Status: None (Preliminary result)    Collection Time: 07/19/20 10:10 AM   Specimen: Left Antecubital; Blood  Result Value Ref Range Status   Specimen Description   Final    LEFT ANTECUBITAL BOTTLES DRAWN AEROBIC AND ANAEROBIC   Special Requests   Final    Blood Culture results may not be optimal due to an excessive volume of blood received in culture bottles   Culture   Final    NO GROWTH 2 DAYS Performed at [facility], [address], [place], [place] [address]    Report Status PENDING  Incomplete  Urine culture     Status: Abnormal (Preliminary result)   Collection Time: 07/19/20 11:30 AM   Specimen: In/Out Cath Urine  Result Value Ref Range Status   Specimen Description   Final    IN/OUT CATH URINE Performed at [facility], [address], [place], [place] [address]    Special Requests   Final    NONE Performed at [facility], [address], [place], [place] [address]    Culture >=100,000 COLONIES/mL GRAM NEGATIVE RODS (A)  Final   Report Status PENDING  Incomplete  MRSA PCR Screening     Status: Abnormal   Collection Time: 07/19/20  5:18 PM   Specimen: Nasopharyngeal  Result Value Ref Range Status   MRSA by PCR POSITIVE (A) NEGATIVE Final    Comment:        The GeneXpert MRSA Assay (FDA approved for NASAL specimens only), is one component of a comprehensive MRSA colonization surveillance program. It is not intended to diagnose MRSA infection nor to guide or monitor treatment for MRSA infections. RESULT CALLED TO, READ BACK BY AND VERIFIED WITH: LEDWELL, L@1145  by [email or web address],b 5.2.22 Performed at [facility], [address], [place], [place] [address]          Radiology Studies: CT Head Wo Contrast  Result Date: 07/19/2020 CLINICAL DATA:  Mental status change EXAM: CT HEAD WITHOUT CONTRAST TECHNIQUE: Contiguous axial  images were obtained from the base of the skull through the vertex without intravenous contrast. COMPARISON:  02/29/2020 FINDINGS: Brain: There is no acute intracranial hemorrhage,  mass effect, or edema. Gray-white differentiation is preserved. There is no extra-axial fluid collection. Ventricles and sulci are within normal limits in size and configuration. Vascular: There is atherosclerotic calcification at the skull base. Skull: Calvarium is unremarkable. Sinuses/Orbits: No acute finding. Other: None. IMPRESSION: No acute intracranial abnormality. Electronically Signed   By: Macy Mis M.D.   On: 07/19/2020 12:54   CT Angio Chest PE W/Cm &/Or Wo Cm  Result Date: 07/19/2020 CLINICAL DATA:  Mental status changes.  Lethargy. EXAM: CT ANGIOGRAPHY CHEST CT ABDOMEN AND PELVIS WITH CONTRAST TECHNIQUE: Multidetector CT imaging of the chest was performed using the standard protocol during bolus administration of intravenous contrast. Multiplanar CT image reconstructions and MIPs were obtained to evaluate the vascular anatomy. Multidetector CT imaging of the abdomen and pelvis was performed using the standard protocol during bolus administration of intravenous contrast. CONTRAST:  165m OMNIPAQUE IOHEXOL 350 MG/ML SOLN COMPARISON:  Abdomen and pelvis CT 05/21/2020 FINDINGS: CTA CHEST FINDINGS Cardiovascular: The heart is enlarged. No substantial pericardial effusion. Coronary artery calcification is evident. Atherosclerotic calcification is noted in the wall of the thoracic aorta. There is no filling defect within the opacified pulmonary arteries to suggest the presence of an acute pulmonary embolus. Mediastinum/Nodes: Mild mediastinal lymphadenopathy shows slight progression. 13 mm short axis subcarinal node was 9 mm previously. 12 mm short axis right paratracheal node on 86/7 with somewhat obscured previously but measured about 8 mm. No left hilar lymphadenopathy. No substantial lymphadenopathy in the right hilum. The esophagus has normal imaging features. There is no axillary lymphadenopathy. Lungs/Pleura: Subsegmental atelectasis noted in the lungs bilaterally, basilar predominant. No overt  pulmonary edema. No pleural effusion. No suspicious pulmonary nodule or mass. Musculoskeletal: No worrisome lytic or sclerotic osseous abnormality. Review of the MIP images confirms the above findings. CT ABDOMEN and PELVIS FINDINGS Hepatobiliary: Nodular hepatic contour again noted compatible with cirrhosis. Stable 8 mm subcapsular low-density lesion posterior right liver, too small to characterize but likely a cyst. Gallbladder is surgically absent with marked dilatation of the extrahepatic bile ducts. Common bile duct measures 14 mm diameter in the head of the pancreas, at the same level it measured 15 mm previously. Pancreas: No focal mass lesion. No dilatation of the main duct. No intraparenchymal cyst. No peripancreatic edema. Spleen: 15.5 cm craniocaudal length, enlarged. Adrenals/Urinary Tract: No adrenal nodule or mass. Tiny hypodensities lower pole right kidney are too small to characterize but likely benign. Similar probably benign tiny hypodensity noted upper po no hydronephrosis No evidence for hydroureter. The urinary bladder appears normal for the degree of distention. Stomach/Bowel: Stomach is unremarkable. No gastric wall thickening. No evidence of outlet obstruction. Duodenum is normally positioned as is the ligament of Treitz. Jejunal loops have an appearance suggesting wall thickening but are nondilated. Terminal ileum unremarkable. Low-density wall thickening noted in the cecum. Colon is decompressed. Scattered diverticuli are seen in the left colon without diverticulitis. Vascular/Lymphatic: There is abdominal aortic atherosclerosis without aneurysm. 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 Marked venous collateralization noted in the left upper abdomen with features suggesting spleno renal shunt. There is no gastrohepatic or hepatoduodenal ligament lymphadenopathy. No retroperitoneal or mesenteric lymphadenopathy. No pelvic sidewall  lymphadenopathy. Reproductive: The uterus is unremarkable.  There is no adnexal mass. Other: Mesenteric edema is associated with small volume free  fluid in the pelvis. Multiple gas bubbles are seen in the extraperitoneal left pelvic floor, posterior to the bladder and adjacent to the vagina. These have a curvilinear/serpiginous character in some areas raising the question of gas within venous anatomy. No soft tissue or vascular gas elsewhere in the pelvis. As above, there is no evidence of diverticulitis. Musculoskeletal: Diffuse body wall edema. No worrisome lytic or sclerotic osseous abnormality. Review of the MIP images confirms the above findings. IMPRESSION: 1. No CT evidence for acute pulmonary embolus. 2. Mild mediastinal lymphadenopathy, potentially reactive. 3. 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 Splenomegaly with extensive collateralization noted in the left abdomen and spleno renal shunt anatomy is consistent with portal venous hypertension. 4. Low-density wall thickening in the cecum with apparent wall thickening and potential edema in proximal small bowel loops. Infectious/inflammatory etiology a consideration although this could also be seen in the setting of systemic disease (hypoproteinemia). 5. Multiple tiny gas bubbles are seen in the extraperitoneal left pelvic floor, posterior to the bladder and adjacent to the vagina. These have a curvilinear/serpiginous character in some areas raising the question of gas within venous anatomy. No soft tissue or vascular gas elsewhere in the pelvis. No findings to explain the presence of this gas. Follow-up could be used to ensure resolution as clinical picture dictates. 6. Marked dilatation of the extrahepatic bile ducts status post cholecystectomy. This may be related to the prior cholecystectomy and is similar to prior studies. Correlation with liver function test may prove helpful. 7. Diffuse body wall edema with small volume  free fluid in the pelvis. 8. Aortic Atherosclerosis (ICD10-I70.0). Electronically Signed   By: Misty Stanley M.D.   On: 07/19/2020 13:17   CT Abdomen Pelvis W Contrast  Result Date: 07/19/2020 CLINICAL DATA:  Mental status changes.  Lethargy. EXAM: CT ANGIOGRAPHY CHEST CT ABDOMEN AND PELVIS WITH CONTRAST TECHNIQUE: Multidetector CT imaging of the chest was performed using the standard protocol during bolus administration of intravenous contrast. Multiplanar CT image reconstructions and MIPs were obtained to evaluate the vascular anatomy. Multidetector CT imaging of the abdomen and pelvis was performed using the standard protocol during bolus administration of intravenous contrast. CONTRAST:  174m OMNIPAQUE IOHEXOL 350 MG/ML SOLN COMPARISON:  Abdomen and pelvis CT 05/21/2020 FINDINGS: CTA CHEST FINDINGS Cardiovascular: The heart is enlarged. No substantial pericardial effusion. Coronary artery calcification is evident. Atherosclerotic calcification is noted in the wall of the thoracic aorta. There is no filling defect within the opacified pulmonary arteries to suggest the presence of an acute pulmonary embolus. Mediastinum/Nodes: Mild mediastinal lymphadenopathy shows slight progression. 13 mm short axis subcarinal node was 9 mm previously. 12 mm short axis right paratracheal node on 86/7 with somewhat obscured previously but measured about 8 mm. No left hilar lymphadenopathy. No substantial lymphadenopathy in the right hilum. The esophagus has normal imaging features. There is no axillary lymphadenopathy. Lungs/Pleura: Subsegmental atelectasis noted in the lungs bilaterally, basilar predominant. No overt pulmonary edema. No pleural effusion. No suspicious pulmonary nodule or mass. Musculoskeletal: No worrisome lytic or sclerotic osseous abnormality. Review of the MIP images confirms the above findings. CT ABDOMEN and PELVIS FINDINGS Hepatobiliary: Nodular hepatic contour again noted compatible with cirrhosis.  Stable 8 mm subcapsular low-density lesion posterior right liver, too small to characterize but likely a cyst. Gallbladder is surgically absent with marked dilatation of the extrahepatic bile ducts. Common bile duct measures 14 mm diameter in the head of the pancreas, at the same level it measured 15 mm previously.  Pancreas: No focal mass lesion. No dilatation of the main duct. No intraparenchymal cyst. No peripancreatic edema. Spleen: 15.5 cm craniocaudal length, enlarged. Adrenals/Urinary Tract: No adrenal nodule or mass. Tiny hypodensities lower pole right kidney are too small to characterize but likely benign. Similar probably benign tiny hypodensity noted upper po no hydronephrosis No evidence for hydroureter. The urinary bladder appears normal for the degree of distention. Stomach/Bowel: Stomach is unremarkable. No gastric wall thickening. No evidence of outlet obstruction. Duodenum is normally positioned as is the ligament of Treitz. Jejunal loops have an appearance suggesting wall thickening but are nondilated. Terminal ileum unremarkable. Low-density wall thickening noted in the cecum. Colon is decompressed. Scattered diverticuli are seen in the left colon without diverticulitis. Vascular/Lymphatic: There is abdominal aortic atherosclerosis without aneurysm. 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 Marked venous collateralization noted in the left upper abdomen with features suggesting spleno renal shunt. There is no gastrohepatic or hepatoduodenal ligament lymphadenopathy. No retroperitoneal or mesenteric lymphadenopathy. No pelvic sidewall lymphadenopathy. Reproductive: The uterus is unremarkable.  There is no adnexal mass. Other: Mesenteric edema is associated with small volume free fluid in the pelvis. Multiple gas bubbles are seen in the extraperitoneal left pelvic floor, posterior to the bladder and adjacent to the vagina. These have a  curvilinear/serpiginous character in some areas raising the question of gas within venous anatomy. No soft tissue or vascular gas elsewhere in the pelvis. As above, there is no evidence of diverticulitis. Musculoskeletal: Diffuse body wall edema. No worrisome lytic or sclerotic osseous abnormality. Review of the MIP images confirms the above findings. IMPRESSION: 1. No CT evidence for acute pulmonary embolus. 2. Mild mediastinal lymphadenopathy, potentially reactive. 3. 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 Splenomegaly with extensive collateralization noted in the left abdomen and spleno renal shunt anatomy is consistent with portal venous hypertension. 4. Low-density wall thickening in the cecum with apparent wall thickening and potential edema in proximal small bowel loops. Infectious/inflammatory etiology a consideration although this could also be seen in the setting of systemic disease (hypoproteinemia). 5. Multiple tiny gas bubbles are seen in the extraperitoneal left pelvic floor, posterior to the bladder and adjacent to the vagina. These have a curvilinear/serpiginous character in some areas raising the question of gas within venous anatomy. No soft tissue or vascular gas elsewhere in the pelvis. No findings to explain the presence of this gas. Follow-up could be used to ensure resolution as clinical picture dictates. 6. Marked dilatation of the extrahepatic bile ducts status post cholecystectomy. This may be related to the prior cholecystectomy and is similar to prior studies. Correlation with liver function test may prove helpful. 7. Diffuse body wall edema with small volume free fluid in the pelvis. 8. Aortic Atherosclerosis (ICD10-I70.0). Electronically Signed   By: Misty Stanley M.D.   On: 07/19/2020 13:17   DG Chest Port 1 View  Result Date: 07/19/2020 CLINICAL DATA:  Possible sepsis, altered mental status EXAM: PORTABLE CHEST 1 VIEW COMPARISON:  07/12/2020 FINDINGS:  Similar cardiomediastinal contours. Chronic interstitial prominence with pulmonary vascular congestion. No significant pleural effusion. No pneumothorax. IMPRESSION: Chronic interstitial prominence with vascular congestion. Electronically Signed   By: Macy Mis M.D.   On: 07/19/2020 11:12        Scheduled Meds: . (feeding supplement) PROSource Plus  30 mL Oral BID BM  . Chlorhexidine Gluconate Cloth  6 each Topical Q0600  . feeding supplement  237 mL Oral BID BM  .  gabapentin  200 mg Oral TID  . insulin aspart  0-15 Units Subcutaneous TID WC  . insulin aspart  0-5 Units Subcutaneous QHS  . lactulose  30 g Oral TID  . levothyroxine  50 mcg Oral QAC breakfast  . lipase/protease/amylase  24,000 Units Oral TID AC  . mouth rinse  15 mL Mouth Rinse BID  . multivitamin with minerals  1 tablet Oral Daily  . mupirocin ointment  1 application Nasal BID  . rifaximin  550 mg Oral BID   Continuous Infusions: . sodium chloride Stopped (07/20/20 1654)  . piperacillin-tazobactam (ZOSYN)  IV 3.375 g (07/21/20 0640)     LOS: 2 days    Time spent: 70 min    Modesta Messing, MS4 [facility]   To contact the attending provider between 7A-7P or the covering provider during after hours 7P-7A, please log into the web site [email or web address] and access using universal [place] password for that web site. If you do not have the password, please call the hospital operator.  07/21/2020, 10:47 AM

## 2020-07-21 NOTE — Progress Notes (Signed)
Pt called for treatment. Pt sats 78% on 5lpm cann pt in distress with wheezes and crackles and tachypneic. Pt o2 changed to 8lpm salter high flow nasal cann. spo2 increase so far to 90% nurse informed

## 2020-07-21 NOTE — Progress Notes (Signed)
Report called to Nurse on Dept. 300. Pt transported via wheelchair with RN and NT.

## 2020-07-21 NOTE — Progress Notes (Signed)
At shift change this morning pt was experiencing similar episode to yesterday evening where she is stating "I am having a panic attack" and "I can't get my breath". Patient was tachycardic and hypertensive, oxygen saturation was in the 80's on 12 liters of oxygen. This RN to room, sat with patient and calmed her down. We talked about goals of care and coping mechanisms. Pt's significant other at bedside and assisted in calming the patient. Dr. Manuella Ghazi to bedside and placed PRN orders for Xanax. Xanax given and shortly after patient relaxed and was resting peacefully. Vitals returned to WNL and oxygen was decreased to 6 liters [name].

## 2020-07-22 DIAGNOSIS — R4182 Altered mental status, unspecified: Secondary | ICD-10-CM | POA: Diagnosis not present

## 2020-07-22 LAB — COMPREHENSIVE METABOLIC PANEL
ALT: 16 U/L (ref 0–44)
AST: 46 U/L — ABNORMAL HIGH (ref 15–41)
Albumin: 2.8 g/dL — ABNORMAL LOW (ref 3.5–5.0)
Alkaline Phosphatase: 74 U/L (ref 38–126)
Anion gap: 5 (ref 5–15)
BUN: 26 mg/dL — ABNORMAL HIGH (ref 6–20)
CO2: 24 mmol/L (ref 22–32)
Calcium: 8.7 mg/dL — ABNORMAL LOW (ref 8.9–10.3)
Chloride: 111 mmol/L (ref 98–111)
Creatinine, Ser: 0.52 mg/dL (ref 0.44–1.00)
GFR, Estimated: >60 mL/min (ref >60)
Glucose, Bld: 188 mg/dL — ABNORMAL HIGH (ref 70–99)
Potassium: 4.1 mmol/L (ref 3.5–5.1)
Sodium: 140 mmol/L (ref 135–145)
Total Bilirubin: 6.9 mg/dL — ABNORMAL HIGH (ref 0.3–1.2)
Total Protein: 5.6 g/dL — ABNORMAL LOW (ref 6.5–8.1)

## 2020-07-22 LAB — URINE CULTURE: Culture: >=100000 — AB

## 2020-07-22 LAB — BASIC METABOLIC PANEL
Anion gap: 5 (ref 5–15)
BUN: 30 mg/dL — ABNORMAL HIGH (ref 6–20)
CO2: 26 mmol/L (ref 22–32)
Calcium: 9 mg/dL (ref 8.9–10.3)
Chloride: 104 mmol/L (ref 98–111)
Creatinine, Ser: 0.66 mg/dL (ref 0.44–1.00)
GFR, Estimated: >60 mL/min (ref >60)
Glucose, Bld: 518 mg/dL (ref 70–99)
Potassium: 3.8 mmol/L (ref 3.5–5.1)
Sodium: 135 mmol/L (ref 135–145)

## 2020-07-22 LAB — LACTATE DEHYDROGENASE: LDH: 304 U/L — ABNORMAL HIGH (ref 98–192)

## 2020-07-22 LAB — CBC
HCT: 28.3 % — ABNORMAL LOW (ref 36.0–46.0)
Hemoglobin: 9 g/dL — ABNORMAL LOW (ref 12.0–15.0)
MCH: 34.6 pg — ABNORMAL HIGH (ref 26.0–34.0)
MCHC: 31.8 g/dL (ref 30.0–36.0)
MCV: 108.8 fL — ABNORMAL HIGH (ref 80.0–100.0)
Platelets: 36 10*3/uL — ABNORMAL LOW (ref 150–400)
RBC: 2.6 MIL/uL — ABNORMAL LOW (ref 3.87–5.11)
RDW: 16.6 % — ABNORMAL HIGH (ref 11.5–15.5)
WBC: 4 10*3/uL (ref 4.0–10.5)
nRBC: 0 % (ref 0.0–0.2)

## 2020-07-22 LAB — GLUCOSE, CAPILLARY
Glucose-Capillary: 176 mg/dL — ABNORMAL HIGH (ref 70–99)
Glucose-Capillary: 336 mg/dL — ABNORMAL HIGH (ref 70–99)
Glucose-Capillary: 446 mg/dL — ABNORMAL HIGH (ref 70–99)
Glucose-Capillary: 489 mg/dL — ABNORMAL HIGH (ref 70–99)
Glucose-Capillary: 426 mg/dL — ABNORMAL HIGH (ref 70–99)

## 2020-07-22 LAB — MAGNESIUM: Magnesium: 1.9 mg/dL (ref 1.7–2.4)

## 2020-07-22 LAB — LACTIC ACID, PLASMA: Lactic Acid, Venous: 1.8 mmol/L (ref 0.5–1.9)

## 2020-07-22 LAB — BILIRUBIN, DIRECT: Bilirubin, Direct: 2.5 mg/dL — ABNORMAL HIGH (ref 0.0–0.2)

## 2020-07-22 MED ORDER — INSULIN ASPART 100 UNIT/ML IJ SOLN
25.0000 [IU] | Freq: Once | INTRAMUSCULAR | Status: AC
  Administered 2020-07-22: 25 [IU] via SUBCUTANEOUS

## 2020-07-22 MED ORDER — FUROSEMIDE 10 MG/ML IJ SOLN
40.0000 mg | Freq: Once | INTRAMUSCULAR | Status: AC
  Administered 2020-07-22: 40 mg via INTRAVENOUS
  Filled 2020-07-22: qty 4

## 2020-07-22 MED ORDER — INSULIN ASPART 100 UNIT/ML IJ SOLN
20.0000 [IU] | Freq: Once | INTRAMUSCULAR | Status: AC
  Administered 2020-07-22: 20 [IU] via SUBCUTANEOUS

## 2020-07-22 MED ORDER — INSULIN ASPART 100 UNIT/ML IJ SOLN
3.0000 [IU] | Freq: Three times a day (TID) | INTRAMUSCULAR | Status: DC
  Administered 2020-07-23 – 2020-07-31 (×16): 3 [IU] via SUBCUTANEOUS

## 2020-07-22 MED ORDER — SODIUM CHLORIDE 0.9 % IV SOLN: 1.0000 g | INTRAVENOUS | Status: DC

## 2020-07-22 MED ORDER — INSULIN ASPART 100 UNIT/ML IJ SOLN
0.0000 [IU] | INTRAMUSCULAR | Status: DC
  Administered 2020-07-23 (×3): 4 [IU] via SUBCUTANEOUS

## 2020-07-22 MED ORDER — AMOXICILLIN-POT CLAVULANATE 875-125 MG PO TABS
1.0000 | ORAL_TABLET | Freq: Two times a day (BID) | ORAL | Status: DC
  Administered 2020-07-22 – 2020-07-25 (×6): 1 via ORAL
  Filled 2020-07-22 (×7): qty 1

## 2020-07-22 NOTE — Progress Notes (Signed)
In/out catheter, one attempt. 400 mL retrieved. Patient tolerated procedure well.

## 2020-07-22 NOTE — Progress Notes (Deleted)
Patient left via [facility] to [facility] cone.

## 2020-07-22 NOTE — Progress Notes (Signed)
Inpatient Diabetes Program Recommendations  AACE/ADA: New Consensus Statement on Inpatient Glycemic Control   Target Ranges:  Prepandial:   less than 140 mg/dL      Peak postprandial:   less than 180 mg/dL (1-2 hours)      Critically ill patients:  140 - 180 mg/dL   Results for [name], [name] (MRN [id number]) as of 07/22/2020 07:41  Ref. Range 07/21/2020 08:02 07/21/2020 11:29 07/21/2020 16:27 07/21/2020 20:30 07/21/2020 23:38 07/22/2020 07:25  Glucose-Capillary Latest Ref Range: 70 - 99 mg/dL 160 (H)  Novolog 3 units 164 (H)  Novolog 3 units 352 (H)  Novolog 15 units  241 (H)  Novolog 2 units      Solumedrol 125 mg 176 (H)   Review of Glycemic Control  Diabetes history: DM2 Outpatient Diabetes medications:OmniPod Insulin Pump with Novolog Current orders for Inpatient glycemic control: Novolog 0-15 units TID with meals, Novolog 0-5 units QHS  Inpatient Diabetes Program Recommendations:    Insulin: Please consider ordering Novolog 3 units TID with meals for meal coverage if patient eats at least 50% of meals.  NOTE: Per chart, patient has DM2 and uses an insulin pump for DM control. Patient sees Dr. Bernie Covey with [facility] and was last seen on 06/30/20. Per office note on 06/30/20, Current pump basal settings should be: BASAL:  12A - 12P: 0.5 units/hr 12P-12A: 0.8 units/hr Total basal 15.6 units/day  Patient is currently only ordered Novolog correction and post prandial glucose is consistently elevated. Recommend adding meal coverage insulin.  Thanks, Barnie Alderman, RN, MSN, CDE Diabetes Coordinator Inpatient Diabetes Program [phone] (Team Pager from 8am to 5pm)  Thanks, Barnie Alderman, RN, MSN, CDE Diabetes Coordinator Inpatient Diabetes Program [phone] (Team Pager from 8am to 5pm)

## 2020-07-22 NOTE — Progress Notes (Signed)
Patient has not had any urine in canister this shift from [employer]. Bladder scanner showed 418 ml in bladder. Notify MD

## 2020-07-22 NOTE — Progress Notes (Signed)
PROGRESS NOTE    HAYDEE JABBOUR  JQG:[id number] DOB: 03-06-1966 DOA: 07/19/2020 PCP: [facility] [place]   Chief Complaint  Patient presents with  . Altered Mental Status    Brief Narrative: KAVERI PERRAS is a 55 y.o. female with medical history significant of cirrhosis, insulin-dependent diabetes on insulin pump, chronic pancreatitis, thrombocytopenia (usually in the 60s), and hypothyroidism who was admitted on 5/1 for hepatic encephalopathy. See HPI and H&P for details. On admission, pt continued to have confusion and noted new-onset neck pain. Mentation has improved, in that she is alert, oriented with choices, and able to follow commands albeit slowly processing before responding. Pt has ongoing anxiety/panic attacks regarding respiration. On hospital day 3 pt became more oriented but was less alert and seemed more edematous on exam prompting a bolus of iv Lasix.  Assessment & Plan:   Active Problems:   Thrombocytopenia (HCC)   Alcoholic cirrhosis of liver without ascites (HCC)   Essential hypertension   Acquired hypothyroidism   Type 2 diabetes mellitus ([place])   Coagulopathy (HCC)   Altered mental status   Sepsis ([place])   Acute lower UTI   DNR (do not resuscitate)   Pancreatic insufficiency   Malnutrition of moderate degree   Sepsis secondary to UTI, no bacteremia - On presentation, 102.5 F, HR 110, AMS, sepsis protocol initiated      - CXR no signs of obvious PNA      - CT Abdomen does not show any foci of infection      - No ascites or acute abdomen on exam, making SBP or GI etiology less likely - Pt continues to complain of neck pain, though passive neck flexion and psoas signs continue to be negative, AMS is improving, and she remains afebrile - making meningitis less likely.      - Avoid LP for now - Pt currently afebrile - UCx revealed >100k E.Coli @1d       - Switched from Pip-Tazo to Augmentin      - BCx revealed no growth @ 3, continue to follow - Given  67m iv Lasix because of edema noted on exam today - Continue to monitor fevers or fluctuations in mental status  Acute Metabolic Encephalopathy - Given unremarkable imaging and roughly baseline ammonia levels, AMS appears to be from fever and sepsis - As fever is improving, mental status is improving.  - She is now roughly at her baseline though more tired than yesterday - Now having episodes of acute anxiety regarding fears ventilation - Continue to monitor  Anxiety Attack, noted hospital day 2 and 3 - As mental status resolved, pt stated "I'm having a panic attack and I can't hold my breath" - Pt stated she had a "bad experience with ventilators" during a hospitalization last Fall, and she was having flashbacks - During episode she became hypertensive and tachycardic, requiring 12L O2 compared to 2-3L at home and 3-5L on admission - PRN Xanax calmed patient, and vitals were stable on 6L O2. - Plan to wean O2 as possible - Continue PRN Xanax  Edema Noted Hospital Day 3 - Given 436miv Lasix - Pending Urine Output, will trend ssx  Thrombocytopenia / Elevated INR 2/2 Cirrhosis - Per prior records, baseline low 2/2 cirrhosis - Platelets 48 on admission, dropped hospital day 1 to 32 and has been steadily increasing, currently 36 - No clinical signs of coagulopathy (purpura, petechiae, ecchymoses) - Continue to trend PPT and INR  Anemia - Hb on admission 10.8,  now 9.0 - Etiology is likely mixed given workup:       - Macrocytic               - B12 and Folate WNL                - Retic count high with elevated % Immature (ddx includes destruction/sequestration/loss of RBCs)       - Ferritin nl, Fe nl, TIBC low, Saturation Ratio high (ddx includes anemia of chronic disease)       - Pain with RUQ palpation (ddx includes splenic sequestration)       - FOBT positive (ddx includes RBC loss)       - Bilirubin elevated in serum and urine (ddx includes RBC destruction vs hepatocyte  dysfunction)               - Total and Direct bili elevated (hepatocyte dysfunction)               - LDH elevated, indirect bili elevated, haptoglobin pending (ddx includes RBC intravascular hemolysis) - Continue to trend with daily CBCs  Cirrhosis of Liver - Holding home diuretics in setting of possible sepsis - Continue home xifaxan and lactulose to prevent hepatic encephalopathy  Lactic Acidosis - Elevated to 3.2 on admission, quickly resolved, rechecked 5/4 WNL - Continue to trend  DM2 - Chronically on insulin pump, hold while admitted - Hold home metformin - Continue SSI and consider starting basal meds - Continue home gabapentin for peripheral neuropathy  Hypothyroidism - Continue home levothyroxine  Pancreatic Insufficiency, likely 2/2 Chronic Pancreatitis - Continue home Creon during meals   DVT prophylaxis: SCDs Code Status: DNR Family Communication: Husband present at bedside Disposition:   Status is: Inpatient, Stepdown  Remains inpatient appropriate because:Altered mental status and Thrombocytopenia   Dispo: The patient is from: Home              Anticipated d/c is to: Home              Patient currently is not medically stable to d/c.   Difficult to place patient No       Consultants:   None at this time  Procedures:   None at this time  Antimicrobials:  d/c Zosyn today, switched to Augmentin   Subjective: INGRA ROTHER is a 55 y.o. female with medical history significant of cirrhosis, insulin-dependent diabetes on insulin pump, chronic pancreatitis, thrombocytopenia (usually in the 60s), and hypothyroidism who was admitted on 5/1 for hepatic encephalopathy. Briefly, the week before admission she felt generally unwell, noted darker and more foul-swelling urine but no dysuria, and the night before admission developed lethargy, confusion, fevers, chills, nausea, and nonbilious vomiting. In the ED, pt had no memory of the prior evening,  continued to have AMS, and was mildly tachycardic. Other ssx, including bowel pain, SOB, urinary frequency and diarrhea 2/2 lactulose therapy were unchanged from her baseline. CT head unremarkable for acute processes, CTA chest r/o PE, and CT Abdomen/Pelvis mentioned scattered areas of thickening.   5/1 - On admission, pt continued to have confusion and noted new-onset neck pain.  5/2 - Mentation has improved, in that she is alert, oriented with choices, and able to follow commands albeit slowly processing before responding. SOB has decreased and pt feels more comfortable.  5/3 - Mentation almost back to baseline, though pt had an "anxiety attack" regarding ventilation, requiring Xanax before calming down. 5/4 - Mentation roughly back to baseline though speaking slowly.  More oriented, but less arousable.   Objective: Vitals:   07/21/20 2028 07/21/20 2256 07/22/20 0437 07/22/20 1343  BP: 124/64  135/83 134/79  Pulse: 82  84 95  Resp: 19  19 18   Temp: 98 F (36.7 C)  97.6 F (36.4 C) 98.7 F (37.1 C)  TempSrc:      SpO2: 91% (!) 78% 96% 100%  Weight:      Height:        Intake/Output Summary (Last 24 hours) at 07/22/2020 1511 Last data filed at 07/22/2020 1300 Gross per 24 hour  Intake 1574.6 ml  Output 400 ml  Net 1174.6 ml   Filed Weights   07/19/20 0951  Weight: 59 kg    Examination:  General exam: Appears calm and comfortable. Marked increase in mentation, almost baseline, AAOx4 but pt was lethargic during exam and stated she felt tired but not confused. Respiratory system: Clear to auscultation. Respiratory effort normal. Cardiovascular system: S1 & S2 heard, RRR. No JVD, murmurs, rubs, gallops or clicks. Mild increase in edema. Gastrointestinal system: Abdomen is tender to light palpation in LUQ and RUQ. Normal bowel sounds heard. Central nervous system: Alert and oriented x4. Sensation to light touch on L side grossly diminished, per her baseline. Passive flexion of neck  negative for pain, Psoas sign negative bilaterally. Extremities: Symmetric 5 x 5 power. Skin: No lesions or ulcers. No petechiae, purpura, or ecchymoses. Psychiatry: Judgement and insight appear normal. Mood & affect appropriate, improved from yesterday but still slow to respond.    Data Reviewed:   CBC: Recent Labs  Lab 07/19/20 1008 07/20/20 0635 07/21/20 0611 07/22/20 0604  WBC 4.8 2.6* 3.5* 4.0  NEUTROABS 4.2  --   --   --   HGB 10.8* 7.6* 8.5* 9.0*  HCT 32.9* 23.2* 26.1* 28.3*  MCV 104.1* 106.4* 106.5* 108.8*  PLT 48* 32* 34* 36*    Basic Metabolic Panel: Recent Labs  Lab 07/19/20 1008 07/20/20 0508 07/21/20 0611 07/22/20 0604  NA 140 139 139 140  K 3.5 3.7 3.7 4.1  CL 108 109 110 111  CO2 26 26 24 24   GLUCOSE 116* 109* 154* 188*  BUN 12 18 19  26*  CREATININE 0.39* 0.58 0.48 0.52  CALCIUM 8.3* 7.8* 8.3* 8.7*  MG  --   --  1.8 1.9    GFR: Estimated Creatinine Clearance: 61.4 mL/min (by C-G formula based on SCr of 0.52 mg/dL).  Liver Function Tests: Recent Labs  Lab 07/19/20 1008 07/20/20 0508 07/21/20 0611 07/22/20 0604  AST 59* 42* 55* 46*  ALT 22 16 16 16   ALKPHOS 102 64 66 74  BILITOT 6.6* 6.1* 6.2* 6.9*  PROT 5.7* 5.0* 5.4* 5.6*  ALBUMIN 2.3* 2.5* 2.9* 2.8*    CBG: Recent Labs  Lab 07/21/20 1129 07/21/20 1627 07/21/20 2030 07/22/20 0725 07/22/20 1107  GLUCAP 164* 352* 241* 176* 336*     Recent Results (from the past 240 hour(s))  Resp Panel by RT-PCR (Flu A&B, Covid) Nasopharyngeal Swab     Status: None   Collection Time: 07/19/20  9:59 AM   Specimen: Nasopharyngeal Swab; Nasopharyngeal(NP) swabs in vial transport medium  Result Value Ref Range Status   SARS Coronavirus 2 by RT PCR NEGATIVE NEGATIVE Final    Comment: (NOTE) SARS-CoV-2 target nucleic acids are NOT DETECTED.  The SARS-CoV-2 RNA is generally detectable in upper respiratory specimens during the acute phase of infection. The lowest concentration of SARS-CoV-2 viral  copies this assay can detect is 138  copies/mL. A negative result does not preclude SARS-Cov-2 infection and should not be used as the sole basis for treatment or other patient management decisions. A negative result may occur with  improper specimen collection/handling, submission of specimen other than nasopharyngeal swab, presence of viral mutation(s) within the areas targeted by this assay, and inadequate number of viral copies(<138 copies/mL). A negative result must be combined with clinical observations, patient history, and epidemiological information. The expected result is Negative.  Fact Sheet for Patients:  EntrepreneurPulse.com.au  Fact Sheet for Healthcare Providers:  IncredibleEmployment.be  This test is no t yet approved or cleared by the [place] FDA and  has been authorized for detection and/or diagnosis of SARS-CoV-2 by FDA under an Emergency Use Authorization (EUA). This EUA will remain  in effect (meaning this test can be used) for the duration of the COVID-19 declaration under Section 564(b)(1) of the Act, 21 U.S.C.section 360bbb-3(b)(1), unless the authorization is terminated  or revoked sooner.       Influenza A by PCR NEGATIVE NEGATIVE Final   Influenza B by PCR NEGATIVE NEGATIVE Final    Comment: (NOTE) The Xpert Xpress SARS-CoV-2/FLU/RSV plus assay is intended as an aid in the diagnosis of influenza from Nasopharyngeal swab specimens and should not be used as a sole basis for treatment. Nasal washings and aspirates are unacceptable for Xpert Xpress SARS-CoV-2/FLU/RSV testing.  Fact Sheet for Patients: EntrepreneurPulse.com.au  Fact Sheet for Healthcare Providers: IncredibleEmployment.be  This test is not yet approved or cleared by the [place] FDA and has been authorized for detection and/or diagnosis of SARS-CoV-2 by FDA under an Emergency Use Authorization (EUA). This  EUA will remain in effect (meaning this test can be used) for the duration of the COVID-19 declaration under Section 564(b)(1) of the Act, 21 U.S.C. section 360bbb-3(b)(1), unless the authorization is terminated or revoked.  Performed at [facility], [address], [place], [place] [address]   Blood Culture (routine x 2)     Status: None (Preliminary result)   Collection Time: 07/19/20 10:10 AM   Specimen: BLOOD RIGHT FOREARM  Result Value Ref Range Status   Specimen Description   Final    BLOOD RIGHT FOREARM BOTTLES DRAWN AEROBIC AND ANAEROBIC   Special Requests Blood Culture adequate volume  Final   Culture   Final    NO GROWTH 3 DAYS Performed at [facility], [address], [place], [place] [address]    Report Status PENDING  Incomplete  Blood Culture (routine x 2)     Status: None (Preliminary result)   Collection Time: 07/19/20 10:10 AM   Specimen: Left Antecubital; Blood  Result Value Ref Range Status   Specimen Description   Final    LEFT ANTECUBITAL BOTTLES DRAWN AEROBIC AND ANAEROBIC   Special Requests   Final    Blood Culture results may not be optimal due to an excessive volume of blood received in culture bottles   Culture   Final    NO GROWTH 3 DAYS Performed at [facility], [address] [place], [place] [address]    Report Status PENDING  Incomplete  Urine culture     Status: Abnormal   Collection Time: 07/19/20 11:30 AM   Specimen: In/Out Cath Urine  Result Value Ref Range Status   Specimen Description   Final    IN/OUT CATH URINE Performed at [facility], [address], [place], [place] [address]    Special Requests   Final    NONE Performed at [facility], [address], [place], [place] [address]    Culture >=100,000 COLONIES/mL ESCHERICHIA COLI (A)  Final   Report Status 07/22/2020 FINAL  Final   Organism ID, Bacteria ESCHERICHIA COLI (A)  Final      Susceptibility   Escherichia coli - MIC*    AMPICILLIN <=2 SENSITIVE Sensitive      CEFAZOLIN <=4 SENSITIVE Sensitive     CEFEPIME <=0.12 SENSITIVE Sensitive     CEFTRIAXONE <=0.25 SENSITIVE Sensitive     CIPROFLOXACIN <=0.25 SENSITIVE Sensitive     GENTAMICIN <=1 SENSITIVE Sensitive     IMIPENEM <=0.25 SENSITIVE Sensitive     NITROFURANTOIN <=16 SENSITIVE Sensitive     TRIMETH/SULFA <=20 SENSITIVE Sensitive     AMPICILLIN/SULBACTAM <=2 SENSITIVE Sensitive     PIP/TAZO <=4 SENSITIVE Sensitive     * >=100,000 COLONIES/mL ESCHERICHIA COLI  MRSA PCR Screening     Status: Abnormal   Collection Time: 07/19/20  5:18 PM   Specimen: Nasopharyngeal  Result Value Ref Range Status   MRSA by PCR POSITIVE (A) NEGATIVE Final    Comment:        The GeneXpert MRSA Assay (FDA approved for NASAL specimens only), is one component of a comprehensive MRSA colonization surveillance program. It is not intended to diagnose MRSA infection nor to guide or monitor treatment for MRSA infections. RESULT CALLED TO, READ BACK BY AND VERIFIED WITH: LEDWELL, L@1145  by [email or web address],b 5.2.22 Performed at [facility], [address], [place], [place] [address]          Radiology Studies: DG CHEST PORT 1 VIEW  Result Date: 07/21/2020 CLINICAL DATA:  Hypoxia EXAM: PORTABLE CHEST 1 VIEW COMPARISON:  07/19/2020 FINDINGS: Since the prior examination, extensive bilateral slightly asymmetric diffuse pulmonary infiltrate has developed, most in keeping with acute infection or, given its relatively rapid development, asymmetric pulmonary edema. Probable tiny left pleural effusion. No pneumothorax. Cardiac size is mildly enlarged. No acute bone abnormality. IMPRESSION: Interval development of extensive bilateral slightly asymmetric pulmonary infiltrate and small left pleural effusion, most likely representing moderate cardiogenic failure given its relatively rapid development. Acute infection, however, could appear similarly. Electronically Signed   By: Fidela Salisbury MD   On: 07/21/2020 23:54         Scheduled Meds: . (feeding supplement) PROSource Plus  30 mL Oral BID BM  . amoxicillin-clavulanate  1 tablet Oral Q12H  . Chlorhexidine Gluconate Cloth  6 each Topical Q0600  . feeding supplement  237 mL Oral BID BM  . gabapentin  200 mg Oral TID  . insulin aspart  0-15 Units Subcutaneous TID WC  . insulin aspart  0-5 Units Subcutaneous QHS  . lactulose  30 g Oral TID  . levothyroxine  50 mcg Oral QAC breakfast  . lipase/protease/amylase  24,000 Units Oral TID AC  . mouth rinse  15 mL Mouth Rinse BID  . multivitamin with minerals  1 tablet Oral Daily  . mupirocin ointment  1 application Nasal BID  . rifaximin  550 mg Oral BID   Continuous Infusions: . albuterol       LOS: 3 days    Time spent: 45 min    Modesta Messing, MS4 [facility]   To contact the attending provider between 7A-7P or the covering provider during after hours 7P-7A, please log into the web site [email or web address] and access using universal [place] password for that web site. If you do not have the password, please call the hospital operator.  07/22/2020, 3:11 PM

## 2020-07-23 DIAGNOSIS — R4182 Altered mental status, unspecified: Secondary | ICD-10-CM | POA: Diagnosis not present

## 2020-07-23 LAB — AMMONIA: Ammonia: 47 umol/L — ABNORMAL HIGH (ref 9–35)

## 2020-07-23 LAB — COMPREHENSIVE METABOLIC PANEL
ALT: 17 U/L (ref 0–44)
AST: 38 U/L (ref 15–41)
Albumin: 2.4 g/dL — ABNORMAL LOW (ref 3.5–5.0)
Alkaline Phosphatase: 72 U/L (ref 38–126)
Anion gap: 4 — ABNORMAL LOW (ref 5–15)
BUN: 32 mg/dL — ABNORMAL HIGH (ref 6–20)
CO2: 28 mmol/L (ref 22–32)
Calcium: 9 mg/dL (ref 8.9–10.3)
Chloride: 107 mmol/L (ref 98–111)
Creatinine, Ser: 0.38 mg/dL — ABNORMAL LOW (ref 0.44–1.00)
GFR, Estimated: >60 mL/min (ref >60)
Glucose, Bld: 189 mg/dL — ABNORMAL HIGH (ref 70–99)
Potassium: 3.7 mmol/L (ref 3.5–5.1)
Sodium: 139 mmol/L (ref 135–145)
Total Bilirubin: 5.2 mg/dL — ABNORMAL HIGH (ref 0.3–1.2)
Total Protein: 5 g/dL — ABNORMAL LOW (ref 6.5–8.1)

## 2020-07-23 LAB — BLOOD GAS, VENOUS
Acid-Base Excess: 4.9 mmol/L — ABNORMAL HIGH (ref 0.0–2.0)
Bicarbonate: 28.2 mmol/L — ABNORMAL HIGH (ref 20.0–28.0)
FIO2: 64
O2 Saturation: 63.1 %
Patient temperature: 37
pCO2, Ven: 45.3 mmHg (ref 44.0–60.0)
pH, Ven: 7.425 (ref 7.250–7.430)
pO2, Ven: 37.2 mmHg (ref 32.0–45.0)

## 2020-07-23 LAB — GLUCOSE, CAPILLARY
Glucose-Capillary: 139 mg/dL — ABNORMAL HIGH (ref 70–99)
Glucose-Capillary: 166 mg/dL — ABNORMAL HIGH (ref 70–99)
Glucose-Capillary: 175 mg/dL — ABNORMAL HIGH (ref 70–99)
Glucose-Capillary: 182 mg/dL — ABNORMAL HIGH (ref 70–99)
Glucose-Capillary: 184 mg/dL — ABNORMAL HIGH (ref 70–99)
Glucose-Capillary: 188 mg/dL — ABNORMAL HIGH (ref 70–99)
Glucose-Capillary: 239 mg/dL — ABNORMAL HIGH (ref 70–99)
Glucose-Capillary: 248 mg/dL — ABNORMAL HIGH (ref 70–99)
Glucose-Capillary: 314 mg/dL — ABNORMAL HIGH (ref 70–99)
Glucose-Capillary: 398 mg/dL — ABNORMAL HIGH (ref 70–99)

## 2020-07-23 LAB — HAPTOGLOBIN: Haptoglobin: <10 mg/dL — ABNORMAL LOW (ref 33–346)

## 2020-07-23 LAB — MAGNESIUM: Magnesium: 1.9 mg/dL (ref 1.7–2.4)

## 2020-07-23 MED ORDER — OXYCODONE HCL 5 MG PO TABS
5.0000 mg | ORAL_TABLET | Freq: Four times a day (QID) | ORAL | Status: DC | PRN
  Administered 2020-07-26 – 2020-07-30 (×3): 5 mg via ORAL
  Filled 2020-07-23 (×4): qty 1

## 2020-07-23 MED ORDER — INSULIN ASPART 100 UNIT/ML IJ SOLN
0.0000 [IU] | Freq: Three times a day (TID) | INTRAMUSCULAR | Status: DC
  Administered 2020-07-23: 2 [IU] via SUBCUTANEOUS
  Administered 2020-07-24: 3 [IU] via SUBCUTANEOUS
  Administered 2020-07-24: 2 [IU] via SUBCUTANEOUS
  Administered 2020-07-25 – 2020-07-26 (×2): 3 [IU] via SUBCUTANEOUS
  Administered 2020-07-26: 5 [IU] via SUBCUTANEOUS
  Administered 2020-07-27 (×2): 3 [IU] via SUBCUTANEOUS
  Administered 2020-07-28: 5 [IU] via SUBCUTANEOUS
  Administered 2020-07-29: 3 [IU] via SUBCUTANEOUS
  Administered 2020-07-29: 8 [IU] via SUBCUTANEOUS
  Administered 2020-07-30: 3 [IU] via SUBCUTANEOUS
  Administered 2020-07-30: 5 [IU] via SUBCUTANEOUS
  Administered 2020-07-31: 2 [IU] via SUBCUTANEOUS
  Administered 2020-07-31: 5 [IU] via SUBCUTANEOUS

## 2020-07-23 MED ORDER — FUROSEMIDE 10 MG/ML IJ SOLN
40.0000 mg | Freq: Two times a day (BID) | INTRAMUSCULAR | Status: DC
  Administered 2020-07-23 (×2): 40 mg via INTRAVENOUS
  Filled 2020-07-23 (×3): qty 4

## 2020-07-23 MED ORDER — INSULIN ASPART 100 UNIT/ML IJ SOLN
0.0000 [IU] | Freq: Every day | INTRAMUSCULAR | Status: DC
  Administered 2020-07-25: 3 [IU] via SUBCUTANEOUS
  Administered 2020-07-30: 2 [IU] via SUBCUTANEOUS

## 2020-07-23 MED ORDER — INSULIN DETEMIR 100 UNIT/ML ~~LOC~~ SOLN
10.0000 [IU] | Freq: Two times a day (BID) | SUBCUTANEOUS | Status: DC
  Administered 2020-07-23 – 2020-07-28 (×9): 10 [IU] via SUBCUTANEOUS
  Filled 2020-07-23 (×13): qty 0.1

## 2020-07-23 NOTE — Progress Notes (Signed)
Nutrition Follow-up  DOCUMENTATION CODES:   Non-severe (moderate) malnutrition in context of chronic illness  INTERVENTION:   -Continue Ensure Enlive po BID, each supplement provides 350 kcal and 20 grams of protein -Continue 30 ml Prosource Plus BID, each supplement provides 100 kcals and 15 grams protein -Magic cup TID with meals, each supplement provides 290 kcal and 9 grams of protein  NUTRITION DIAGNOSIS:   Moderate Malnutrition related to chronic illness (alchololic cirrhosis, pancreatitis) as evidenced by mild muscle depletion,mild fat depletion,energy intake < 75% for > or equal to 1 month.  Ongoing  GOAL:   Patient will meet greater than or equal to 90% of their needs  Progressing   MONITOR:   PO intake,Supplement acceptance,Labs,I & O's,Weight trends,Skin  REASON FOR ASSESSMENT:   Malnutrition Screening Tool    ASSESSMENT:   Patient is a chronically ill 55 yo. Hx of DM2, Alcoholic Cirrhosis, Pancreatitis and chronic nausea. She presents with altered mental status and acute UTI.  Reviewed I/O's: +720 ml x 24 hours and +7.8 L x 24 hours  Spoke with pt at bedside, who was very lethargic at time of visit. Pt opened eyes and greeted RD, but was unable to answer further questions. Observed breakfast tray- pt consumed about 25% of french toast. Documented meal completion 10-50%.   Medications reviewed and include lasix, lactulose, and creon.   Pt with poor oral intake and would benefit from nutrient dense supplement. One Ensure Enlive supplement provides 350 kcals, 20 grams protein, and 44-45 grams of carbohydrate vs one Glucerna shake supplement, which provides 220 kcals, 10 grams of protein, and 26 grams of carbohydrate. Given pt's hx of DM, RD will reassess adequacy of PO intake, CBGS, and adjust supplement regimen as appropriate at follow-up.   Labs reviewed: CBGS: 267-124 (inpatient orders for glycemic control are 0-20 units insulin aspart every 4 hours and 3  units insulin aspart TID with meals).   Diet Order:   Diet Order            Diet heart healthy/carb modified Room service appropriate? Yes; Fluid consistency: Thin; Fluid restriction: 1500 mL Fluid  Diet effective now                 EDUCATION NEEDS:   Education needs have been addressed  Skin:  Skin Assessment: Skin Integrity Issues: Skin Integrity Issues:: Stage II Stage II: buttock  Last BM:  07/22/20  Height:   Ht Readings from Last 1 Encounters:  07/19/20 5' 2"  (1.575 m)    Weight:   Wt Readings from Last 1 Encounters:  07/23/20 66 kg   BMI:  Body mass index is 26.61 kg/m.  Estimated Nutritional Needs:   Kcal:  2000-2200  Protein:  115-130 grams  Fluid:  1.5 L    Loistine Chance, RD, LDN, [place] Registered Dietitian II Certified Diabetes Care and Education Specialist Please refer to [facility] for RD and/or RD on-call/weekend/after hours pager

## 2020-07-23 NOTE — Plan of Care (Signed)

## 2020-07-23 NOTE — Progress Notes (Signed)
PROGRESS NOTE    [name]  [id number]:[id number] DOB: 06/04/1965 DOA: 07/19/2020 PCP: [facility]   Chief Complaint  Patient presents with  . Altered Mental Status    Brief Narrative: [name] is a 55 y.o. female with medical history significant of cirrhosis, insulin-dependent diabetes on insulin pump, chronic pancreatitis, thrombocytopenia (usually in the 60s), and hypothyroidism who was admitted on 5/1 for hepatic encephalopathy. See HPI and H&P for details. On admission, pt continued to have confusion and noted new-onset neck pain. Mentation has improved, in that she is alert, oriented with choices, and able to follow commands albeit slowly processing before responding. Pt has ongoing anxiety/panic attacks regarding respiration. On hospital day 3-4 pt became less oriented and less alert. Pt also became edematous and is improving with iv Lasix bid.   Assessment & Plan:   Active Problems:   Thrombocytopenia (HCC)   Alcoholic cirrhosis of liver without ascites (HCC)   Essential hypertension   Acquired hypothyroidism   Type 2 diabetes mellitus ([place])   Coagulopathy (HCC)   Altered mental status   Sepsis ([place])   Acute lower UTI   DNR (do not resuscitate)   Pancreatic insufficiency   Malnutrition of moderate degree   Sepsis secondary to UTI, no bacteremia - On presentation, 102.5 F, HR 110, AMS, sepsis protocol initiated      - CXR no signs of obvious PNA      - CT Abdomen does not show any foci of infection      - No ascites or acute abdomen on exam, making SBP or GI etiology less likely - Pt continues to complain of neck pain, though passive neck flexion and psoas signs continue to be negative, AMS is improving, and she remains afebrile - making meningitis less likely.      - Avoid LP for now - UCx revealed >100k E.Coli @1d       - Continue Augmentin      - BCx revealed no growth @ 3, continue to follow - Pt currently afebrile - Pt currently SpO2 95% on  12L O2 [place], try and wean as tolerated - Continue to monitor fevers or fluctuations in mental status  Acute Metabolic Encephalopathy - Given unremarkable imaging and roughly baseline ammonia levels, AMS appears to be from fever and sepsis - As fever is improving, mental status is improving.  - She is no longer at her baseline - today less oriented, more tired and lethargic than yesterday - With worsening AMS she is not currently having episodes of acute anxiety regarding fears ventilation - Continue to monitor  Anxiety Attack, noted hospital day 2 and 3 - As mental status resolved, pt stated "I'm having a panic attack and I can't hold my breath" - Pt stated she had a "bad experience with ventilators" during a hospitalization last Fall, and she was having flashbacks - During episode she became hypertensive and tachycardic, requiring 12L O2 compared to 2-3L at home and 3-5L on admission - Continue PRN Xanax, not needed since 2 nights ago  Edema [place] Hospital Day 3 - Edema first noted on hospital day 3, CXR demonstrated vol overload (full impression below)  - Given 100m iv Lasix bid yesterday, good urine output but still slightly edematous - Continue Lasix 463mbid  Thrombocytopenia / Elevated INR 2/2 Cirrhosis - Per prior records, baseline low 2/2 cirrhosis - Platelets 48 on admission, dropped hospital day 1 to 32 and has been steadily increasing, currently 36 - No clinical signs  of coagulopathy (purpura, petechiae, ecchymoses) - Repeat CBC tomorrow AM - Continue to trend PPT and INR  Anemia - Hb on admission 10.8, now 9.0 - Etiology is likely mixed given workup:       - Macrocytic               - B12 and Folate WNL                - Retic count high with elevated % Immature (ddx includes destruction/sequestration/loss of RBCs)       - Ferritin nl, Fe nl, TIBC low, Saturation Ratio high (ddx includes anemia of chronic disease)       - Pain with deep LUQ palpation (ddx includes splenic  sequestration)       - FOBT positive (ddx includes RBC loss)       - Bilirubin elevated in serum and urine (ddx includes RBC destruction vs hepatocyte dysfunction)               - Total, Direct, and Indirect bili elevated, Indirect >> Direct (ddx includes both RBC hemolysis and to a lesser extent liver hepatocyte dysfunction regarding capacity for conjugation)               - LDH elevated, indirect bili elevated, haptoglobin decreased (ddx includes RBC intravascular hemolysis) - Repeat CBC tomorrow AM  Cirrhosis of Liver - Holding home diuretics in setting of possible sepsis - Continue home xifaxan and lactulose to prevent hepatic encephalopathy  Lactic Acidosis - Elevated to 3.2 on admission, quickly resolved, rechecked 5/4 WNL - Continue to trend  DM2 - Chronically on insulin pump, hold while admitted - Hold home metformin - Continue SSI and consider starting basal meds - Continue home gabapentin for peripheral neuropathy  Hypothyroidism - Continue home levothyroxine  Pancreatic Insufficiency, likely 2/2 Chronic Pancreatitis - Continue home Creon during meals   DVT prophylaxis: SCDs Code Status: DNR Family Communication: No family at bedside Disposition:   Status is: Inpatient, Stepdown  Remains inpatient appropriate because:Altered mental status and Thrombocytopenia   Dispo: The patient is from: Home              Anticipated d/c is to: Home              Patient currently is not medically stable to d/c.   Difficult to place patient No       Consultants:   None at this time  Procedures:   None at this time  Antimicrobials:  d/c Zosyn today, switched to Augmentin   Subjective: [name] is a 55 y.o. female with medical history significant of cirrhosis, insulin-dependent diabetes on insulin pump, chronic pancreatitis, thrombocytopenia (usually in the 60s), and hypothyroidism who was admitted on 5/1 for hepatic encephalopathy. Briefly, the week  before admission she felt generally unwell, noted darker and more foul-swelling urine but no dysuria, and the night before admission developed lethargy, confusion, fevers, chills, nausea, and nonbilious vomiting. In the ED, pt had no memory of the prior evening, continued to have AMS, and was mildly tachycardic. Other ssx, including bowel pain, SOB, urinary frequency and diarrhea 2/2 lactulose therapy were unchanged from her baseline. CT head unremarkable for acute processes, CTA chest r/o PE, and CT Abdomen/Pelvis mentioned scattered areas of thickening.   5/1 - On admission, pt continued to have confusion and noted new-onset neck pain.  5/2 - Mentation has improved, in that she is alert, oriented with choices, and able to follow commands albeit slowly  processing before responding. SOB has decreased and pt feels more comfortable.  5/3 - Mentation almost back to baseline, though pt had an "anxiety attack" regarding ventilation, requiring Xanax before calming down. 5/4 - Mentation roughly back to baseline though speaking slowly. More oriented, but less arousable.  5/5 - Mentation worse than yesterday. Pt seemed more lethargic and was only oriented to self, needing to be constantly awoken during exam.   Objective: Vitals:   07/22/20 2038 07/22/20 2120 07/23/20 0242 07/23/20 0500  BP:  140/62 136/62   Pulse:  (!) 101 89   Resp:  19 19   Temp:  98 F (36.7 C) 98.3 F (36.8 C)   TempSrc:  Oral    SpO2: 95% 97% 95%   Weight:    66 kg  Height:        Intake/Output Summary (Last 24 hours) at 07/23/2020 1034 Last data filed at 07/22/2020 1818 Gross per 24 hour  Intake 480 ml  Output --  Net 480 ml   Filed Weights   07/19/20 0951 07/23/20 0500  Weight: 59 kg 66 kg    Examination:  General exam: Appears calm and comfortable. Marked decrease in mentation, Only alert to self, but pt was very lethargic during exam and stated she felt tired and confused. Respiratory system: Clear to auscultation.  Respiratory effort normal. Cardiovascular system: S1 & S2 heard, RRR. No JVD, 2+ diastolic murmur loudest at L upper sternal border. No rubs, gallops or clicks. No edema noted. Gastrointestinal system: Abdomen is not tender in any quadrants. Normal bowel sounds heard. Central nervous system: Pt lethargic and able to wiggle toes to command but otherwise fell asleep during every other attempt. Extremities: Symmetric 5 x 5 power. Skin: No lesions or ulcers. No petechiae, purpura, or ecchymoses. Psychiatry: Judgement and insight UTA. Mood & affect UTA, able to respond quickly with appropriate content but profoundly lethargic.    Data Reviewed:   CBC: Recent Labs  Lab 07/19/20 1008 07/20/20 0635 07/21/20 0611 07/22/20 0604  WBC 4.8 2.6* 3.5* 4.0  NEUTROABS 4.2  --   --   --   HGB 10.8* 7.6* 8.5* 9.0*  HCT 32.9* 23.2* 26.1* 28.3*  MCV 104.1* 106.4* 106.5* 108.8*  PLT 48* 32* 34* 36*    Basic Metabolic Panel: Recent Labs  Lab 07/20/20 0508 07/21/20 0611 07/22/20 0604 07/22/20 2140 07/23/20 0634  NA 139 139 140 135 139  K 3.7 3.7 4.1 3.8 3.7  CL 109 110 111 104 107  CO2 26 24 24 26 28   GLUCOSE 109* 154* 188* 518* 189*  BUN 18 19 26* 30* 32*  CREATININE 0.58 0.48 0.52 0.66 0.38*  CALCIUM 7.8* 8.3* 8.7* 9.0 9.0  MG  --  1.8 1.9  --  1.9    GFR: Estimated Creatinine Clearance: 69.2 mL/min (A) (by C-G formula based on SCr of 0.38 mg/dL (L)).  Liver Function Tests: Recent Labs  Lab 07/19/20 1008 07/20/20 0508 07/21/20 0611 07/22/20 0604 07/23/20 0634  AST 59* 42* 55* 46* 38  ALT 22 16 16 16 17   ALKPHOS 102 64 66 74 72  BILITOT 6.6* 6.1* 6.2* 6.9* 5.2*  PROT 5.7* 5.0* 5.4* 5.6* 5.0*  ALBUMIN 2.3* 2.5* 2.9* 2.8* 2.4*    CBG: Recent Labs  Lab 07/23/20 0208 07/23/20 0247 07/23/20 0414 07/23/20 0538 07/23/20 0731  GLUCAP 248* 239* 175* 184* 166*     Recent Results (from the past 240 hour(s))  Resp Panel by RT-PCR (Flu A&B, Covid) Nasopharyngeal Swab  Status: None   Collection Time: 07/19/20  9:59 AM   Specimen: Nasopharyngeal Swab; Nasopharyngeal(NP) swabs in vial transport medium  Result Value Ref Range Status   SARS Coronavirus 2 by RT PCR NEGATIVE NEGATIVE Final    Comment: (NOTE) SARS-CoV-2 target nucleic acids are NOT DETECTED.  The SARS-CoV-2 RNA is generally detectable in upper respiratory specimens during the acute phase of infection. The lowest concentration of SARS-CoV-2 viral copies this assay can detect is 138 copies/mL. A negative result does not preclude SARS-Cov-2 infection and should not be used as the sole basis for treatment or other patient management decisions. A negative result may occur with  improper specimen collection/handling, submission of specimen other than nasopharyngeal swab, presence of viral mutation(s) within the areas targeted by this assay, and inadequate number of viral copies(<138 copies/mL). A negative result must be combined with clinical observations, patient history, and epidemiological information. The expected result is Negative.  Fact Sheet for Patients:  EntrepreneurPulse.com.au  Fact Sheet for Healthcare Providers:  IncredibleEmployment.be  This test is no t yet approved or cleared by the [place] FDA and  has been authorized for detection and/or diagnosis of SARS-CoV-2 by FDA under an Emergency Use Authorization (EUA). This EUA will remain  in effect (meaning this test can be used) for the duration of the COVID-19 declaration under Section 564(b)(1) of the Act, 21 U.S.C.section 360bbb-3(b)(1), unless the authorization is terminated  or revoked sooner.       Influenza A by PCR NEGATIVE NEGATIVE Final   Influenza B by PCR NEGATIVE NEGATIVE Final    Comment: (NOTE) The Xpert Xpress SARS-CoV-2/FLU/RSV plus assay is intended as an aid in the diagnosis of influenza from Nasopharyngeal swab specimens and should not be used as a sole basis  for treatment. Nasal washings and aspirates are unacceptable for Xpert Xpress SARS-CoV-2/FLU/RSV testing.  Fact Sheet for Patients: EntrepreneurPulse.com.au  Fact Sheet for Healthcare Providers: IncredibleEmployment.be  This test is not yet approved or cleared by the [place] FDA and has been authorized for detection and/or diagnosis of SARS-CoV-2 by FDA under an Emergency Use Authorization (EUA). This EUA will remain in effect (meaning this test can be used) for the duration of the COVID-19 declaration under Section 564(b)(1) of the Act, 21 U.S.C. section 360bbb-3(b)(1), unless the authorization is terminated or revoked.  Performed at [facility], [address] [place], [place] [address]   Blood Culture (routine x 2)     Status: None (Preliminary result)   Collection Time: 07/19/20 10:10 AM   Specimen: BLOOD RIGHT FOREARM  Result Value Ref Range Status   Specimen Description   Final    BLOOD RIGHT FOREARM BOTTLES DRAWN AEROBIC AND ANAEROBIC   Special Requests Blood Culture adequate volume  Final   Culture   Final    NO GROWTH 3 DAYS Performed at [facility], [address] [place], [place] [address]    Report Status PENDING  Incomplete  Blood Culture (routine x 2)     Status: None (Preliminary result)   Collection Time: 07/19/20 10:10 AM   Specimen: Left Antecubital; Blood  Result Value Ref Range Status   Specimen Description   Final    LEFT ANTECUBITAL BOTTLES DRAWN AEROBIC AND ANAEROBIC   Special Requests   Final    Blood Culture results may not be optimal due to an excessive volume of blood received in culture bottles   Culture   Final    NO GROWTH 3 DAYS Performed at [facility], [address] [place], [place] [address]    Report Status PENDING  Incomplete  Urine culture     Status: Abnormal   Collection Time: 07/19/20 11:30 AM   Specimen: In/Out Cath Urine  Result Value Ref Range Status   Specimen Description   Final     IN/OUT CATH URINE Performed at [facility], [address] [place], [place] [address]    Special Requests   Final    NONE Performed at [facility], [address], [place], [place] [address]    Culture >=100,000 COLONIES/mL ESCHERICHIA COLI (A)  Final   Report Status 07/22/2020 FINAL  Final   Organism ID, Bacteria ESCHERICHIA COLI (A)  Final      Susceptibility   Escherichia coli - MIC*    AMPICILLIN <=2 SENSITIVE Sensitive     CEFAZOLIN <=4 SENSITIVE Sensitive     CEFEPIME <=0.12 SENSITIVE Sensitive     CEFTRIAXONE <=0.25 SENSITIVE Sensitive     CIPROFLOXACIN <=0.25 SENSITIVE Sensitive     GENTAMICIN <=1 SENSITIVE Sensitive     IMIPENEM <=0.25 SENSITIVE Sensitive     NITROFURANTOIN <=16 SENSITIVE Sensitive     TRIMETH/SULFA <=20 SENSITIVE Sensitive     AMPICILLIN/SULBACTAM <=2 SENSITIVE Sensitive     PIP/TAZO <=4 SENSITIVE Sensitive     * >=100,000 COLONIES/mL ESCHERICHIA COLI  MRSA PCR Screening     Status: Abnormal   Collection Time: 07/19/20  5:18 PM   Specimen: Nasopharyngeal  Result Value Ref Range Status   MRSA by PCR POSITIVE (A) NEGATIVE Final    Comment:        The GeneXpert MRSA Assay (FDA approved for NASAL specimens only), is one component of a comprehensive MRSA colonization surveillance program. It is not intended to diagnose MRSA infection nor to guide or monitor treatment for MRSA infections. RESULT CALLED TO, READ BACK BY AND VERIFIED WITH: LEDWELL, L@1145  by [email or web address],b 5.2.22 Performed at [facility], [address] [place],  [address]          Radiology Studies: DG CHEST PORT 1 VIEW  Result Date: 07/21/2020 CLINICAL DATA:  Hypoxia EXAM: PORTABLE CHEST 1 VIEW COMPARISON:  07/19/2020 FINDINGS: Since the prior examination, extensive bilateral slightly asymmetric diffuse pulmonary infiltrate has developed, most in keeping with acute infection or, given its relatively rapid development, asymmetric pulmonary edema. Probable tiny left  pleural effusion. No pneumothorax. Cardiac size is mildly enlarged. No acute bone abnormality. IMPRESSION: Interval development of extensive bilateral slightly asymmetric pulmonary infiltrate and small left pleural effusion, most likely representing moderate cardiogenic failure given its relatively rapid development. Acute infection, however, could appear similarly. Electronically Signed   By: Fidela Salisbury MD   On: 07/21/2020 23:54        Scheduled Meds: . (feeding supplement) PROSource Plus  30 mL Oral BID BM  . amoxicillin-clavulanate  1 tablet Oral Q12H  . Chlorhexidine Gluconate Cloth  6 each Topical Q0600  . feeding supplement  237 mL Oral BID BM  . furosemide  40 mg Intravenous Q12H  . gabapentin  200 mg Oral TID  . insulin aspart  0-20 Units Subcutaneous Q4H  . insulin aspart  3 Units Subcutaneous TID WC  . lactulose  30 g Oral TID  . levothyroxine  50 mcg Oral QAC breakfast  . lipase/protease/amylase  24,000 Units Oral TID AC  . mouth rinse  15 mL Mouth Rinse BID  . multivitamin with minerals  1 tablet Oral Daily  . mupirocin ointment  1 application Nasal BID  .  rifaximin  550 mg Oral BID   Continuous Infusions: . albuterol       LOS: 4 days    Time spent: 45 min    Modesta Messing, MS4 [facility]   To contact the attending provider between 7A-7P or the covering provider during after hours 7P-7A, please log into the web site [email or web address] and access using universal [place] password for that web site. If you do not have the password, please call the hospital operator.  07/23/2020, 10:34 AM

## 2020-07-23 NOTE — Progress Notes (Signed)
Inpatient Diabetes Program Recommendations  AACE/ADA: New Consensus Statement on Inpatient Glycemic Control (2015)  Target Ranges:  Prepandial:   less than 140 mg/dL      Peak postprandial:   less than 180 mg/dL (1-2 hours)      Critically ill patients:  140 - 180 mg/dL   Lab Results  Component Value Date   GLUCAP 182 (H) 07/23/2020   HGBA1C 5.1 06/07/2020    Review of Glycemic Control Results for NOVALYNN, BRANAMAN (MRN [id number]) as of 07/23/2020 12:16  Ref. Range 07/22/2020 21:15 07/22/2020 23:03 07/23/2020 00:00 07/23/2020 01:07 07/23/2020 02:08 07/23/2020 02:47 07/23/2020 04:14 07/23/2020 05:38 07/23/2020 07:31 07/23/2020 11:37  Glucose-Capillary Latest Ref Range: 70 - 99 mg/dL 489 (H) 426 (H) 398 (H) 314 (H) 248 (H) 239 (H) 175 (H) 184 (H) 166 (H) 182 (H)   Diabetes history:  DM 2 Outpatient Diabetes medications:  NOTE: Per chart, patient has DM2 and uses an insulin pump for DM control. Patient sees Dr. Bernie Covey with [facility] and was last seen on 06/30/20. Per office note on 06/30/20, Current pump basal settings should be: BASAL:  12A - 12P: 0.5 units/hr 12P-12A: 0.8 units/hr Total basal 15.6 units/day Current orders for Inpatient glycemic control:  Novolog resistant q 4 hours Novolog 3 units tid with meals   Inpatient Diabetes Program Recommendations:    Consider adding Levemir 10 units bid and reduce Novolog correction to tid with meals and HS. Received Solumedrol on 5/3 which possibly increased blood sugars on 5/4.    Thanks,  Adah Perl, RN, BC-ADM Inpatient Diabetes Coordinator Pager [phone] (8a-5p)

## 2020-07-24 DIAGNOSIS — R4182 Altered mental status, unspecified: Secondary | ICD-10-CM | POA: Diagnosis not present

## 2020-07-24 LAB — CULTURE, BLOOD (ROUTINE X 2)
Culture: NO GROWTH
Culture: NO GROWTH
Special Requests: ADEQUATE

## 2020-07-24 LAB — COMPREHENSIVE METABOLIC PANEL
ALT: 19 U/L (ref 0–44)
AST: 53 U/L — ABNORMAL HIGH (ref 15–41)
Albumin: 2.6 g/dL — ABNORMAL LOW (ref 3.5–5.0)
Alkaline Phosphatase: 77 U/L (ref 38–126)
Anion gap: 8 (ref 5–15)
BUN: 29 mg/dL — ABNORMAL HIGH (ref 6–20)
CO2: 28 mmol/L (ref 22–32)
Calcium: 9.1 mg/dL (ref 8.9–10.3)
Chloride: 106 mmol/L (ref 98–111)
Creatinine, Ser: 0.34 mg/dL — ABNORMAL LOW (ref 0.44–1.00)
GFR, Estimated: >60 mL/min (ref >60)
Glucose, Bld: 171 mg/dL — ABNORMAL HIGH (ref 70–99)
Potassium: 2.8 mmol/L — ABNORMAL LOW (ref 3.5–5.1)
Sodium: 142 mmol/L (ref 135–145)
Total Bilirubin: 7.8 mg/dL — ABNORMAL HIGH (ref 0.3–1.2)
Total Protein: 5.4 g/dL — ABNORMAL LOW (ref 6.5–8.1)

## 2020-07-24 LAB — C DIFFICILE (CDIFF) QUICK SCRN (NO PCR REFLEX)
C Diff antigen: NEGATIVE
C Diff interpretation: NOT DETECTED
C Diff toxin: NEGATIVE

## 2020-07-24 LAB — CBC
HCT: 28.2 % — ABNORMAL LOW (ref 36.0–46.0)
Hemoglobin: 8.9 g/dL — ABNORMAL LOW (ref 12.0–15.0)
MCH: 34 pg (ref 26.0–34.0)
MCHC: 31.6 g/dL (ref 30.0–36.0)
MCV: 107.6 fL — ABNORMAL HIGH (ref 80.0–100.0)
Platelets: 49 10*3/uL — ABNORMAL LOW (ref 150–400)
RBC: 2.62 MIL/uL — ABNORMAL LOW (ref 3.87–5.11)
RDW: 17.2 % — ABNORMAL HIGH (ref 11.5–15.5)
WBC: 6 10*3/uL (ref 4.0–10.5)
nRBC: 0 % (ref 0.0–0.2)

## 2020-07-24 LAB — GLUCOSE, CAPILLARY
Glucose-Capillary: 111 mg/dL — ABNORMAL HIGH (ref 70–99)
Glucose-Capillary: 145 mg/dL — ABNORMAL HIGH (ref 70–99)
Glucose-Capillary: 162 mg/dL — ABNORMAL HIGH (ref 70–99)
Glucose-Capillary: 98 mg/dL (ref 70–99)

## 2020-07-24 LAB — MAGNESIUM: Magnesium: 1.6 mg/dL — ABNORMAL LOW (ref 1.7–2.4)

## 2020-07-24 LAB — PATHOLOGIST SMEAR REVIEW

## 2020-07-24 MED ORDER — POTASSIUM CHLORIDE 10 MEQ/100ML IV SOLN
10.0000 meq | INTRAVENOUS | Status: AC
  Administered 2020-07-24 (×4): 10 meq via INTRAVENOUS
  Filled 2020-07-24 (×2): qty 100

## 2020-07-24 MED ORDER — POTASSIUM CHLORIDE CRYS ER 20 MEQ PO TBCR
40.0000 meq | EXTENDED_RELEASE_TABLET | Freq: Once | ORAL | Status: DC
  Filled 2020-07-24: qty 2

## 2020-07-24 MED ORDER — ALPRAZOLAM 0.5 MG PO TABS
0.5000 mg | ORAL_TABLET | Freq: Three times a day (TID) | ORAL | Status: DC | PRN
  Administered 2020-07-26 – 2020-07-29 (×6): 0.5 mg via ORAL
  Filled 2020-07-24 (×7): qty 1

## 2020-07-24 MED ORDER — MAGNESIUM SULFATE 2 GM/50ML IV SOLN
2.0000 g | Freq: Once | INTRAVENOUS | Status: AC
  Administered 2020-07-24: 2 g via INTRAVENOUS
  Filled 2020-07-24: qty 50

## 2020-07-24 NOTE — Care Management Important Message (Signed)
Important Message  Patient Details  Name: [name] MRN: [id number] Date of Birth: [birth year]   Medicare Important Message Given:  Yes     Tommy Medal 07/24/2020, 11:34 AM

## 2020-07-24 NOTE — Progress Notes (Signed)
Pt has had constant liquid stool all night long, perenium is mascerated, pt is very confused. Whenever she it touched for cleaning (or anything) pt screams to the top of her lungs "please" in addition to expletives. This morning pt transferred self to chair beside her bed, dislodging IV, and trailing stool. Pt needed her arm held down in order to obtain labs because she would not be still. Pt has had bath; blood, stool, and urine cleaned from patient, bedrails, floor, chair, and wall. Tele reattached to pt's back r/t pt refusing to turn. Pt is currently resting comfortably, will continue to monitor.

## 2020-07-24 NOTE — Plan of Care (Signed)

## 2020-07-24 NOTE — Progress Notes (Signed)
PROGRESS NOTE    [name]  YNW:[id number] DOB: 1965/12/20 DOA: 07/19/2020 PCP: [facility]   Chief Complaint  Patient presents with  . Altered Mental Status    Brief Narrative: [name] is a 55 y.o. female with medical history significant of cirrhosis, insulin-dependent diabetes on insulin pump, chronic pancreatitis, thrombocytopenia (usually in the 60s), and hypothyroidism who was admitted on 5/1 for hepatic encephalopathy. See HPI and H&P for details. On admission, pt continued to have confusion and noted new-onset neck pain. Mentation has improved, in that she is alert, oriented with choices, and able to follow commands albeit slowly processing before responding. Pt has ongoing anxiety/panic attacks regarding respiration. On 5/5 pt became less oriented and less alert, so gabapentin and Xanax were discontinued with presumed etiology of polypharmacy. O/n, pt became agitated, ripped out iv line, was uncooperative, and had profound liquid diarrhea. Restarting Xanax and holding Lasix today 5/6.   Assessment & Plan:   Active Problems:   Thrombocytopenia ([place])   Alcoholic cirrhosis of liver without ascites (H[facility])   Essential hypertension   Acquired hypothyroidism   Type 2 diabetes mellitus ([place])   Coagulopathy ([place])   Altered mental status   Sepsis ([place])   Acute lower UTI   DNR (do not resuscitate)   Pancreatic insufficiency   Malnutrition of moderate degree   Sepsis secondary to E.Coli UTI, no bacteremia - On presentation, 102.5 F, HR 110, AMS, sepsis protocol initiated      - CXR no signs of obvious PNA      - CT Abdomen does not show any foci of infection      - No ascites or acute abdomen on exam, making SBP or GI etiology less likely - Pt continues to complain of neck pain, though passive neck flexion and psoas signs continue to be negative, AMS was improving but pt worsening over past several days.       - Avoid LP for now - UCx revealed >100k  E.Coli @1d       - Path labs revealed pan-sensitivity and susceptibility to all Abx in panel      - Continue Augmentin      - BCx revealed no growth @ 5d, continue to follow  - Pt currently afebrile - Pt currently SpO2 93-97%, agitated and taking off [place] - Continue to monitor fevers or fluctuations in mental status  Acute Metabolic Encephalopathy - Given unremarkable imaging and roughly baseline ammonia levels, AMS appeared initially to be from fever and sepsis - Pt was improving with resolution of fever and sepsis, but  ago became more obtunded. With the assumption of polypharmacy as the cause, gabapentin and Xanax were discontinued. Since then pt has become agitated, ripped out iv line, and is uncooperative.  - Xanax restarted 5/6 - Continue to monitor  Anxiety Attack, noted hospital day 2 and 3 - When mental status initially resolved, pt stated "I'm having a panic attack and I can't hold my breath" - Pt stated she had a "bad experience with ventilators" during a hospitalization last Fall, and she was having flashbacks - During episode she became hypertensive and tachycardic, requiring 12L O2 compared to 2-3L at home and 3-5L on admission - For this and worsening agitation/encephalopathy per above, restart PRN Xanax  Edema Noted Hospital Day 3 - Edema first noted on hospital day 3, CXR demonstrated vol overload (full impression below)  - Pt had good urine output on 40 mg Lasix bid but 5/5-5/6 pt had near-continuous  liquid diarrhea - Mg and K slightly low on AM labs 5/6, given Mg and K, likely due to diuretic use - Holding Lasix for the time being - Recheck BMP and Mg on 5/7 AM  Thrombocytopenia / Elevated INR 2/2 Cirrhosis - Platelets 48 on admission, dropped to 32, now 49 - No clinical signs of coagulopathy (purpura, petechiae, ecchymoses) - Repeat CBC tomorrow AM - Continue to trend PPT and INR as needed  Anemia - Hb on admission 10.8, now 8.9 - Etiology is likely mixed given  workup:       - Macrocytic               - B12 and Folate WNL                - Retic count high with elevated % Immature (ddx includes destruction/sequestration/loss of RBCs)       - Ferritin nl, Fe nl, TIBC low, Saturation Ratio high (ddx includes anemia of chronic disease)       - Pain with deep LUQ palpation (ddx includes splenic sequestration)       - FOBT positive (ddx includes RBC loss)       - Bilirubin elevated in serum and urine (ddx includes RBC destruction vs hepatocyte dysfunction)               - Total, Direct, and Indirect bili elevated, Indirect >> Direct (ddx includes both RBC hemolysis and to a lesser extent liver hepatocyte dysfunction regarding capacity for conjugation)               - LDH elevated, indirect bili elevated, haptoglobin decreased (ddx includes RBC intravascular hemolysis) - Repeat CBC 5/7 AM  Cirrhosis of Liver - Holding home diuretics in setting of possible sepsis - Ammonia WNL - Continue home xifaxan and lactulose to prevent hepatic encephalopathy  Lactic Acidosis, Resolved - Elevated to 3.2 on admission, quickly resolved, rechecked 5/4 WNL - Continue to trend if needed  DM2 - Chronically on insulin pump, hold while admitted - Hold home metformin - Per DM consult on 5/5, appreciate recs   - "Consider adding Levemir 10 units bid and reduce Novolog correction to tid with meals and HS. Received Solumedrol on 5/3 which possibly increased blood sugars on 5/4."  - Started Levemir 10 and Novolog per recs on 5/5, continue  Hypothyroidism - Continue home levothyroxine  Pancreatic Insufficiency, likely 2/2 Chronic Pancreatitis - Continue home Creon during meals   DVT prophylaxis: SCDs Code Status: DNR Family Communication: No family at bedside Disposition:   Status is: Inpatient, Stepdown  Remains inpatient appropriate because:Altered mental status and Thrombocytopenia   Dispo: The patient is from: Home              Anticipated d/c is to: Home               Patient currently is not medically stable to d/c.   Difficult to place patient No       Consultants:   Inpatient DM consult on 5/5, appreciate recs  No consultants ordered today 5/6  Procedures:   None at this time  Antimicrobials:  Continue Augmentin   Subjective: [name] is a 55 y.o. female with medical history significant of cirrhosis, insulin-dependent diabetes on insulin pump, chronic pancreatitis, thrombocytopenia (usually in the 60s), and hypothyroidism who was admitted on 5/1 for hepatic encephalopathy. Briefly, the week before admission she felt generally unwell, noted darker and more foul-swelling urine but no dysuria, and  the night before admission developed lethargy, confusion, fevers, chills, nausea, and nonbilious vomiting. In the ED, pt had no memory of the prior evening, continued to have AMS, and was mildly tachycardic. Other ssx, including bowel pain, SOB, urinary frequency and diarrhea 2/2 lactulose therapy were unchanged from her baseline. CT head unremarkable for acute processes, CTA chest r/o PE, and CT Abdomen/Pelvis mentioned scattered areas of thickening.   5/1 - On admission, pt continued to have confusion and noted new-onset neck pain.  5/2 - Mentation has improved, in that she is alert, oriented with choices, and able to follow commands albeit slowly processing before responding. SOB has decreased and pt feels more comfortable.  5/3 - Mentation almost back to baseline, though pt had an "anxiety attack" regarding ventilation, requiring Xanax before calming down. 5/4 - Mentation roughly back to baseline though speaking slowly. More oriented, but less arousable.  5/5 - Mentation worse than yesterday. Pt seemed more lethargic and was only oriented to self, needing to be constantly awoken during exam.  5/6 - Pt experienced consistent liquid stool o/n, and agitation worsened with cessation of Xanax. Pt ripped out iv line and was uncooperative.  Pt generally uncooperative and mentation seems worse.  Objective: Vitals:   07/23/20 1432 07/23/20 2105 07/23/20 2300 07/24/20 0521  BP: 134/65 134/66  (!) 167/99  Pulse: 81 (!) 117 86 100  Resp: 17 18  18   Temp: 98.4 F (36.9 C) 99.3 F (37.4 C)  98.7 F (37.1 C)  TempSrc:    Axillary  SpO2: 97% 98%  94%  Weight:      Height:        Intake/Output Summary (Last 24 hours) at 07/24/2020 1114 Last data filed at 07/24/2020 1010 Gross per 24 hour  Intake 900 ml  Output --  Net 900 ml   Filed Weights   07/19/20 0951 07/23/20 0500  Weight: 59 kg 66 kg    Examination:  General exam: Appears calm and comfortable. Marked decrease in mentation, unwilling to cooperate to most commands but was alert to self.  Respiratory system: Clear to auscultation. Respiratory effort normal. Cardiovascular system: S1 & S2 heard, RRR. No JVD. No rubs, gallops or clicks. No edema noted. Gastrointestinal system: Abdomen is not tender in any quadrants, though pt was agitated, but no guarding or rebound tenderness. Normal bowel sounds heard. Central nervous system: Pt lethargic and able to wiggle toes to command but otherwise grossly nl; UTA due to patient's AMS and agitation Extremities: Grossly antigravity and symmetric with spontaneous movement. Skin: No lesions or ulcers. No petechiae, purpura, or ecchymoses. Psychiatry: Judgement and insight UTA. Mood & affect UTA but a mix of lethargy and agitation, able to respond quickly with mostly appropriate content but profoundly lethargic.    Data Reviewed:   CBC: Recent Labs  Lab 07/19/20 1008 07/20/20 0635 07/21/20 0611 07/22/20 0604 07/24/20 0635  WBC 4.8 2.6* 3.5* 4.0 6.0  NEUTROABS 4.2  --   --   --   --   HGB 10.8* 7.6* 8.5* 9.0* 8.9*  HCT 32.9* 23.2* 26.1* 28.3* 28.2*  MCV 104.1* 106.4* 106.5* 108.8* 107.6*  PLT 48* 32* 34* 36* 49*    Basic Metabolic Panel: Recent Labs  Lab 07/21/20 0611 07/22/20 0604 07/22/20 2140 07/23/20 0634  07/24/20 0635  NA 139 140 135 139 142  K 3.7 4.1 3.8 3.7 2.8*  CL 110 111 104 107 106  CO2 24 24 26 28 28   GLUCOSE 154* 188* 518* 189* 171*  BUN 19 26* 30* 32* 29*  CREATININE 0.48 0.52 0.66 0.38* 0.34*  CALCIUM 8.3* 8.7* 9.0 9.0 9.1  MG 1.8 1.9  --  1.9 1.6*    GFR: Estimated Creatinine Clearance: 69.2 mL/min (A) (by C-G formula based on SCr of 0.34 mg/dL (L)).  Liver Function Tests: Recent Labs  Lab 07/20/20 0508 07/21/20 0611 07/22/20 0604 07/23/20 0634 07/24/20 0635  AST 42* 55* 46* 38 53*  ALT 16 16 16 17 19   ALKPHOS 64 66 74 72 77  BILITOT 6.1* 6.2* 6.9* 5.2* 7.8*  PROT 5.0* 5.4* 5.6* 5.0* 5.4*  ALBUMIN 2.5* 2.9* 2.8* 2.4* 2.6*    CBG: Recent Labs  Lab 07/23/20 0731 07/23/20 1137 07/23/20 1612 07/23/20 2104 07/24/20 0731  GLUCAP 166* 182* 139* 188* 162*     Recent Results (from the past 240 hour(s))  Resp Panel by RT-PCR (Flu A&B, Covid) Nasopharyngeal Swab     Status: None   Collection Time: 07/19/20  9:59 AM   Specimen: Nasopharyngeal Swab; Nasopharyngeal(NP) swabs in vial transport medium  Result Value Ref Range Status   SARS Coronavirus 2 by RT PCR NEGATIVE NEGATIVE Final    Comment: (NOTE) SARS-CoV-2 target nucleic acids are NOT DETECTED.  The SARS-CoV-2 RNA is generally detectable in upper respiratory specimens during the acute phase of infection. The lowest concentration of SARS-CoV-2 viral copies this assay can detect is 138 copies/mL. A negative result does not preclude SARS-Cov-2 infection and should not be used as the sole basis for treatment or other patient management decisions. A negative result may occur with  improper specimen collection/handling, submission of specimen other than nasopharyngeal swab, presence of viral mutation(s) within the areas targeted by this assay, and inadequate number of viral copies(<138 copies/mL). A negative result must be combined with clinical observations, patient history, and  epidemiological information. The expected result is Negative.  Fact Sheet for Patients:  EntrepreneurPulse.com.au  Fact Sheet for Healthcare Providers:  IncredibleEmployment.be  This test is no t yet approved or cleared by the [place] FDA and  has been authorized for detection and/or diagnosis of SARS-CoV-2 by FDA under an Emergency Use Authorization (EUA). This EUA will remain  in effect (meaning this test can be used) for the duration of the COVID-19 declaration under Section 564(b)(1) of the Act, 21 U.S.C.section 360bbb-3(b)(1), unless the authorization is terminated  or revoked sooner.       Influenza A by PCR NEGATIVE NEGATIVE Final   Influenza B by PCR NEGATIVE NEGATIVE Final    Comment: (NOTE) The Xpert Xpress SARS-CoV-2/FLU/RSV plus assay is intended as an aid in the diagnosis of influenza from Nasopharyngeal swab specimens and should not be used as a sole basis for treatment. Nasal washings and aspirates are unacceptable for Xpert Xpress SARS-CoV-2/FLU/RSV testing.  Fact Sheet for Patients: EntrepreneurPulse.com.au  Fact Sheet for Healthcare Providers: IncredibleEmployment.be  This test is not yet approved or cleared by the [place] FDA and has been authorized for detection and/or diagnosis of SARS-CoV-2 by FDA under an Emergency Use Authorization (EUA). This EUA will remain in effect (meaning this test can be used) for the duration of the COVID-19 declaration under Section 564(b)(1) of the Act, 21 U.S.C. section 360bbb-3(b)(1), unless the authorization is terminated or revoked.  Performed at [facility], [address], [place], [place] [address]   Blood Culture (routine x 2)     Status: None   Collection Time: 07/19/20 10:10 AM   Specimen: BLOOD RIGHT FOREARM  Result Value Ref Range Status  Specimen Description   Final    BLOOD RIGHT FOREARM BOTTLES DRAWN AEROBIC AND ANAEROBIC    Special Requests Blood Culture adequate volume  Final   Culture   Final    NO GROWTH 5 DAYS Performed at [facility], [address], [place], [place] [address]    Report Status 07/24/2020 FINAL  Final  Blood Culture (routine x 2)     Status: None   Collection Time: 07/19/20 10:10 AM   Specimen: Left Antecubital; Blood  Result Value Ref Range Status   Specimen Description   Final    LEFT ANTECUBITAL BOTTLES DRAWN AEROBIC AND ANAEROBIC   Special Requests   Final    Blood Culture results may not be optimal due to an excessive volume of blood received in culture bottles   Culture   Final    NO GROWTH 5 DAYS Performed at [facility], [address] [place], [place] [address]    Report Status 07/24/2020 FINAL  Final  Urine culture     Status: Abnormal   Collection Time: 07/19/20 11:30 AM   Specimen: In/Out Cath Urine  Result Value Ref Range Status   Specimen Description   Final    IN/OUT CATH URINE Performed at [facility], [address], [place], [place] [address]    Special Requests   Final    NONE Performed at [facility], [address] [place], [place] [address]    Culture >=100,000 COLONIES/mL ESCHERICHIA COLI (A)  Final   Report Status 07/22/2020 FINAL  Final   Organism ID, Bacteria ESCHERICHIA COLI (A)  Final      Susceptibility   Escherichia coli - MIC*    AMPICILLIN <=2 SENSITIVE Sensitive     CEFAZOLIN <=4 SENSITIVE Sensitive     CEFEPIME <=0.12 SENSITIVE Sensitive     CEFTRIAXONE <=0.25 SENSITIVE Sensitive     CIPROFLOXACIN <=0.25 SENSITIVE Sensitive     GENTAMICIN <=1 SENSITIVE Sensitive     IMIPENEM <=0.25 SENSITIVE Sensitive     NITROFURANTOIN <=16 SENSITIVE Sensitive     TRIMETH/SULFA <=20 SENSITIVE Sensitive     AMPICILLIN/SULBACTAM <=2 SENSITIVE Sensitive     PIP/TAZO <=4 SENSITIVE Sensitive     * >=100,000 COLONIES/mL ESCHERICHIA COLI  MRSA PCR Screening     Status: Abnormal   Collection Time: 07/19/20  5:18 PM   Specimen: Nasopharyngeal  Result  Value Ref Range Status   MRSA by PCR POSITIVE (A) NEGATIVE Final    Comment:        The GeneXpert MRSA Assay (FDA approved for NASAL specimens only), is one component of a comprehensive MRSA colonization surveillance program. It is not intended to diagnose MRSA infection nor to guide or monitor treatment for MRSA infections. RESULT CALLED TO, READ BACK BY AND VERIFIED WITH: LEDWELL, L@1145  by [email or web address],b 5.2.22 Performed at [facility], [address] [place], [place] [address]          Radiology Studies: No results found.      Scheduled Meds: . (feeding supplement) PROSource Plus  30 mL Oral BID BM  . amoxicillin-clavulanate  1 tablet Oral Q12H  . Chlorhexidine Gluconate Cloth  6 each Topical Q0600  . feeding supplement  237 mL Oral BID BM  . insulin aspart  0-15 Units Subcutaneous TID WC  . insulin aspart  0-5 Units Subcutaneous QHS  . insulin aspart  3 Units Subcutaneous TID WC  . insulin detemir  10 Units Subcutaneous BID  . levothyroxine  50 mcg Oral QAC breakfast  .  lipase/protease/amylase  24,000 Units Oral TID AC  . mouth rinse  15 mL Mouth Rinse BID  . multivitamin with minerals  1 tablet Oral Daily  . mupirocin ointment  1 application Nasal BID  . potassium chloride  40 mEq Oral Once  . rifaximin  550 mg Oral BID   Continuous Infusions: . albuterol    . potassium chloride       LOS: 5 days    Time spent: 45 min    Modesta Messing, MS4 [facility]   To contact the attending provider between 7A-7P or the covering provider during after hours 7P-7A, please log into the web site [email or web address] and access using universal [place] password for that web site. If you do not have the password, please call the hospital operator.  07/24/2020, 11:14 AM

## 2020-07-25 ENCOUNTER — Inpatient Hospital Stay (HOSPITAL_COMMUNITY): Payer: Medicare HMO

## 2020-07-25 DIAGNOSIS — R0603 Acute respiratory distress: Secondary | ICD-10-CM

## 2020-07-25 DIAGNOSIS — R4182 Altered mental status, unspecified: Secondary | ICD-10-CM | POA: Diagnosis not present

## 2020-07-25 LAB — GASTROINTESTINAL PANEL BY PCR, STOOL (REPLACES STOOL CULTURE)

## 2020-07-25 LAB — BASIC METABOLIC PANEL
Anion gap: 7 (ref 5–15)
BUN: 28 mg/dL — ABNORMAL HIGH (ref 6–20)
CO2: 29 mmol/L (ref 22–32)
Calcium: 8.8 mg/dL — ABNORMAL LOW (ref 8.9–10.3)
Chloride: 109 mmol/L (ref 98–111)
Creatinine, Ser: <0.3 mg/dL — ABNORMAL LOW (ref 0.44–1.00)
Glucose, Bld: 78 mg/dL (ref 70–99)
Potassium: 2.9 mmol/L — ABNORMAL LOW (ref 3.5–5.1)
Sodium: 145 mmol/L (ref 135–145)

## 2020-07-25 LAB — ECHOCARDIOGRAM COMPLETE
AR max vel: 1.24 cm2
AV Area VTI: 1.31 cm2
AV Area mean vel: 1.26 cm2
AV Mean grad: 14 mmHg
AV Peak grad: 25 mmHg
Ao pk vel: 2.5 m/s
Area-P 1/2: 3.85 cm2
Height: 62 in
S' Lateral: 3.27 cm
Weight: 2148.16 oz

## 2020-07-25 LAB — CBC
HCT: 29.4 % — ABNORMAL LOW (ref 36.0–46.0)
Hemoglobin: 9.4 g/dL — ABNORMAL LOW (ref 12.0–15.0)
MCH: 34.3 pg — ABNORMAL HIGH (ref 26.0–34.0)
MCHC: 32 g/dL (ref 30.0–36.0)
MCV: 107.3 fL — ABNORMAL HIGH (ref 80.0–100.0)
Platelets: 57 10*3/uL — ABNORMAL LOW (ref 150–400)
RBC: 2.74 MIL/uL — ABNORMAL LOW (ref 3.87–5.11)
RDW: 17.2 % — ABNORMAL HIGH (ref 11.5–15.5)
WBC: 7.3 10*3/uL (ref 4.0–10.5)
nRBC: 0 % (ref 0.0–0.2)

## 2020-07-25 LAB — BLOOD GAS, ARTERIAL
Acid-Base Excess: 4 mmol/L — ABNORMAL HIGH (ref 0.0–2.0)
Bicarbonate: 28.2 mmol/L — ABNORMAL HIGH (ref 20.0–28.0)
FIO2: 60
O2 Saturation: 95.3 %
Patient temperature: 37.2
pCO2 arterial: 35.9 mmHg (ref 32.0–48.0)
pH, Arterial: 7.494 — ABNORMAL HIGH (ref 7.350–7.450)
pO2, Arterial: 69.9 mmHg — ABNORMAL LOW (ref 83.0–108.0)

## 2020-07-25 LAB — GLUCOSE, CAPILLARY
Glucose-Capillary: 73 mg/dL (ref 70–99)
Glucose-Capillary: 115 mg/dL — ABNORMAL HIGH (ref 70–99)
Glucose-Capillary: 193 mg/dL — ABNORMAL HIGH (ref 70–99)
Glucose-Capillary: 255 mg/dL — ABNORMAL HIGH (ref 70–99)

## 2020-07-25 LAB — PROCALCITONIN: Procalcitonin: 1.26 ng/mL

## 2020-07-25 LAB — MAGNESIUM: Magnesium: 1.9 mg/dL (ref 1.7–2.4)

## 2020-07-25 MED ORDER — IPRATROPIUM-ALBUTEROL 0.5-2.5 (3) MG/3ML IN SOLN
3.0000 mL | Freq: Four times a day (QID) | RESPIRATORY_TRACT | Status: DC
  Administered 2020-07-25 – 2020-07-26 (×6): 3 mL via RESPIRATORY_TRACT
  Filled 2020-07-25 (×7): qty 3

## 2020-07-25 MED ORDER — LOPERAMIDE HCL 2 MG PO CAPS
4.0000 mg | ORAL_CAPSULE | Freq: Two times a day (BID) | ORAL | Status: DC
  Administered 2020-07-25 – 2020-07-27 (×5): 4 mg via ORAL
  Filled 2020-07-25 (×6): qty 2

## 2020-07-25 MED ORDER — LACTATED RINGERS IV SOLN: INTRAVENOUS | Status: AC

## 2020-07-25 MED ORDER — POTASSIUM CHLORIDE 10 MEQ/100ML IV SOLN
10.0000 meq | INTRAVENOUS | Status: DC
  Administered 2020-07-25 (×4): 10 meq via INTRAVENOUS
  Filled 2020-07-25 (×5): qty 100

## 2020-07-25 MED ORDER — VANCOMYCIN HCL 1250 MG/250ML IV SOLN
1250.0000 mg | Freq: Once | INTRAVENOUS | Status: AC
  Administered 2020-07-25: 1250 mg via INTRAVENOUS
  Filled 2020-07-25: qty 250

## 2020-07-25 MED ORDER — POTASSIUM CHLORIDE CRYS ER 20 MEQ PO TBCR
40.0000 meq | EXTENDED_RELEASE_TABLET | Freq: Once | ORAL | Status: AC
  Administered 2020-07-25: 40 meq via ORAL
  Filled 2020-07-25: qty 2

## 2020-07-25 MED ORDER — ACETYLCYSTEINE 20 % IN SOLN
1.0000 mL | Freq: Two times a day (BID) | RESPIRATORY_TRACT | Status: DC
  Administered 2020-07-25 – 2020-07-29 (×2): 1 mL via RESPIRATORY_TRACT
  Filled 2020-07-25 (×6): qty 4

## 2020-07-25 MED ORDER — PIPERACILLIN-TAZOBACTAM 3.375 G IVPB
3.3750 g | Freq: Three times a day (TID) | INTRAVENOUS | Status: DC
  Administered 2020-07-25 – 2020-07-30 (×15): 3.375 g via INTRAVENOUS
  Filled 2020-07-25 (×15): qty 50

## 2020-07-25 MED ORDER — VANCOMYCIN HCL 750 MG/150ML IV SOLN
750.0000 mg | Freq: Three times a day (TID) | INTRAVENOUS | Status: DC
  Administered 2020-07-26 – 2020-07-28 (×7): 750 mg via INTRAVENOUS
  Filled 2020-07-25 (×7): qty 150

## 2020-07-25 NOTE — Progress Notes (Signed)
  Echocardiogram 2D Echocardiogram has been performed.  Marijke Guadiana G Grizel Vesely 07/25/2020, 1:28 PM

## 2020-07-25 NOTE — Progress Notes (Addendum)
PROGRESS NOTE    [name]  ZMO:[id number] DOB: 1965/10/24 DOA: 07/19/2020 PCP: [facility]   Brief Narrative:   Joan Chandra Hernandezis a 55 y.o.femalewith medical history significant ofcirrhosis, insulin-dependent diabetes on insulin pump, chronic pancreatitis, thrombocytopenia (usually in the 60s), and hypothyroidism who was admitted on 5/1 for hepatic encephalopathy. See HPI and H&P for details. On admission, pt continued to have confusion and noted new-onset neck pain.  She was admitted with sepsis secondary to E. coli UTI without findings of bacteria, but now appears to have worsening mentation with potential sepsis related to multifocal pneumonia.  Her antibiotic coverage will be broadened to vancomycin and Zosyn today.  Assessment & Plan:   Active Problems:   Thrombocytopenia ([place])   Alcoholic cirrhosis of liver without ascites (HCC)   Essential hypertension   Acquired hypothyroidism   Type 2 diabetes mellitus ([place])   Coagulopathy (HCC)   Altered mental status   Sepsis ([place])   Acute lower UTI   DNR (do not resuscitate)   Pancreatic insufficiency   Malnutrition of moderate degree   Acute metabolic encephalopathy-multifactorial -Appears to be related to sepsis with multifocal pneumonia, initially with E. coli UTI -Possibly related to hypoxemia she continues to pull oxygen off -Expand coverage to vancomycin and Zosyn 5/7, discontinue Augmentin -Follow-up chest x-ray and ABG -Xanax as needed for anxiety episodes -We will consider palliative consultation if she does not begin to improve as she may have a component of failure to thrive  Acute on chronic hypoxemic respiratory failure with multifocal pneumonia- likely HCAP -Question if she is actually aspirating given her worsening mentation -Dysphagia 1 diet and SLP evaluation ordered -Vancomycin and Zosyn initiated today -MRSA PCR positive -DuoNeb scheduled every 6 hours -Mucomyst -Incentive  spirometry/flutter valve -2D echo ordered 5/7 for further evaluation  Persistent hypokalemia with noted diarrhea -Lactulose currently held -Continue repletion and monitor -Stool C. difficile negative -GI panel pending  Thrombocytopenia/elevated INR secondary to alcoholic liver cirrhosis-stable -Continue to monitor -Holding lactulose secondary to above  Anemia-stable -Monitor repeat CBC  Type 2 diabetes -Holding Levemir until appetite improves -Continue SSI  Hypothyroidism -Continue levothyroxine  Pancreatic insufficiency in the setting of chronic pancreatitis -Continue home Creon during meals  DVT prophylaxis: SCDs Code Status: DNR Family Communication: Discussed with husband at bedside 5/7 Disposition Plan:  Status is: Inpatient  Remains inpatient appropriate because:Altered mental status, IV treatments appropriate due to intensity of illness or inability to take PO and Inpatient level of care appropriate due to severity of illness   Dispo: The patient is from: Home              Anticipated d/c is to: Home              Patient currently is not medically stable to d/c.   Difficult to place patient No   Nutritional Assessment:  The patient's BMI is: Body mass index is 24.56 kg/m.Marland Kitchen  Seen by dietician.  I agree with the assessment and plan as outlined below:  Nutrition Status: Nutrition Problem: Moderate Malnutrition Etiology: chronic illness (alchololic cirrhosis, pancreatitis) Signs/Symptoms: mild muscle depletion,mild fat depletion,energy intake < 75% for > or equal to 1 month Interventions: Ensure Enlive (each supplement provides 350kcal and 20 grams of protein),MVI,Prostat  Skin Assessment:  I have examined the patient's skin and I agree with the wound assessment as performed by the wound care RN as outlined below:  Pressure Injury 07/19/20 Buttocks Right Stage 2 -  Partial thickness loss of dermis presenting  as a shallow open injury with a red, pink wound  bed without slough. (Active)  07/19/20 1905  Location: Buttocks  Location Orientation: Right  Staging: Stage 2 -  Partial thickness loss of dermis presenting as a shallow open injury with a red, pink wound bed without slough.  Wound Description (Comments):   Present on Admission: Yes    Consultants:   None  Procedures:   See below  Antimicrobials:  Anti-infectives (From admission, onward)   Start     Dose/Rate Route Frequency Ordered Stop   07/25/20 1415  piperacillin-tazobactam (ZOSYN) IVPB 3.375 g        3.375 g 12.5 mL/hr over 240 Minutes Intravenous Every 8 hours 07/25/20 1318     07/25/20 1415  vancomycin (VANCOREADY) IVPB 1250 mg/250 mL        1,250 mg 166.7 mL/hr over 90 Minutes Intravenous  Once 07/25/20 1318     07/22/20 1000  amoxicillin-clavulanate (AUGMENTIN) 875-125 MG per tablet 1 tablet  Status:  Discontinued        1 tablet Oral Every 12 hours 07/22/20 0805 07/25/20 1307   07/22/20 0830  cefTRIAXone (ROCEPHIN) 1 g in sodium chloride 0.9 % 100 mL IVPB  Status:  Discontinued        1 g 200 mL/hr over 30 Minutes Intravenous Every 24 hours 07/22/20 0731 07/22/20 0802   07/20/20 2000  piperacillin-tazobactam (ZOSYN) IVPB 3.375 g  Status:  Discontinued        3.375 g 12.5 mL/hr over 240 Minutes Intravenous Every 8 hours 07/20/20 1116 07/22/20 0731   07/20/20 1200  piperacillin-tazobactam (ZOSYN) IVPB 3.375 g        3.375 g 100 mL/hr over 30 Minutes Intravenous  Once 07/20/20 1115 07/20/20 1249   07/20/20 0500  vancomycin (VANCOREADY) IVPB 750 mg/150 mL  Status:  Discontinued        750 mg 150 mL/hr over 60 Minutes Intravenous Every 12 hours 07/19/20 1740 07/21/20 1005   07/20/20 0300  aztreonam (AZACTAM) 2 g in sodium chloride 0.9 % 100 mL IVPB  Status:  Discontinued        2 g 200 mL/hr over 30 Minutes Intravenous Every 8 hours 07/19/20 1740 07/20/20 1114   07/19/20 2200  rifaximin (XIFAXAN) tablet 550 mg        550 mg Oral 2 times daily 07/19/20 1620      07/19/20 1630  aztreonam (AZACTAM) 2 g in sodium chloride 0.9 % 100 mL IVPB        2 g 200 mL/hr over 30 Minutes Intravenous  Once 07/19/20 1620 07/19/20 1835   07/19/20 1630  metroNIDAZOLE (FLAGYL) IVPB 500 mg  Status:  Discontinued        500 mg 100 mL/hr over 60 Minutes Intravenous Every 8 hours 07/19/20 1620 07/20/20 1114   07/19/20 1630  vancomycin (VANCOCIN) IVPB 1000 mg/200 mL premix        1,000 mg 200 mL/hr over 60 Minutes Intravenous  Once 07/19/20 1620 07/19/20 2000   07/19/20 1145  piperacillin-tazobactam (ZOSYN) IVPB 3.375 g        3.375 g 100 mL/hr over 30 Minutes Intravenous  Once 07/19/20 1130 07/19/20 1301   07/19/20 1145  azithromycin (ZITHROMAX) 500 mg in sodium chloride 0.9 % 250 mL IVPB  Status:  Discontinued        500 mg 250 mL/hr over 60 Minutes Intravenous Every 24 hours 07/19/20 1130 07/19/20 1620       Subjective: Patient seen and evaluated  today with ongoing confusion noted.  She continues to pull off her oxygen and becomes hypoxemic.  Objective: Vitals:   07/24/20 1440 07/24/20 2335 07/25/20 0401 07/25/20 0500  BP: (!) 157/94 (!) 163/71 138/65   Pulse: (!) 107 100 96   Resp: 17 19 20    Temp: 99.4 F (37.4 C) 98.6 F (37 C) 99.1 F (37.3 C)   TempSrc:  Oral Oral   SpO2: 97% (!) 82% 100%   Weight:    60.9 kg  Height:        Intake/Output Summary (Last 24 hours) at 07/25/2020 1311 Last data filed at 07/25/2020 0031 Gross per 24 hour  Intake 550 ml  Output --  Net 550 ml   Filed Weights   07/19/20 0951 07/23/20 0500 07/25/20 0500  Weight: 59 kg 66 kg 60.9 kg    Examination:  General exam: Appears calm and comfortable  Respiratory system: Clear to auscultation. Respiratory effort normal. Cardiovascular system: S1 & S2 heard, RRR.  Gastrointestinal system: Abdomen is soft Central nervous system: Alert and awake Extremities: No edema Skin: No significant lesions noted Psychiatry: Flat affect.    Data Reviewed: I have personally reviewed  following labs and imaging studies  CBC: Recent Labs  Lab 07/19/20 1008 07/20/20 0635 07/21/20 0611 07/22/20 0604 07/24/20 0635 07/25/20 0705  WBC 4.8 2.6* 3.5* 4.0 6.0 7.3  NEUTROABS 4.2  --   --   --   --   --   HGB 10.8* 7.6* 8.5* 9.0* 8.9* 9.4*  HCT 32.9* 23.2* 26.1* 28.3* 28.2* 29.4*  MCV 104.1* 106.4* 106.5* 108.8* 107.6* 107.3*  PLT 48* 32* 34* 36* 49* 57*   Basic Metabolic Panel: Recent Labs  Lab 07/21/20 0611 07/22/20 0604 07/22/20 2140 07/23/20 0634 07/24/20 0635 07/25/20 0705  NA 139 140 135 139 142 145  K 3.7 4.1 3.8 3.7 2.8* 2.9*  CL 110 111 104 107 106 109  CO2 24 24 26 28 28 29   GLUCOSE 154* 188* 518* 189* 171* 78  BUN 19 26* 30* 32* 29* 28*  CREATININE 0.48 0.52 0.66 0.38* 0.34* <0.30*  CALCIUM 8.3* 8.7* 9.0 9.0 9.1 8.8*  MG 1.8 1.9  --  1.9 1.6* 1.9   GFR: CrCl cannot be calculated (This lab value cannot be used to calculate CrCl because it is not a number: <0.30). Liver Function Tests: Recent Labs  Lab 07/20/20 0508 07/21/20 0611 07/22/20 0604 07/23/20 0634 07/24/20 0635  AST 42* 55* 46* 38 53*  ALT 16 16 16 17 19   ALKPHOS 64 66 74 72 77  BILITOT 6.1* 6.2* 6.9* 5.2* 7.8*  PROT 5.0* 5.4* 5.6* 5.0* 5.4*  ALBUMIN 2.5* 2.9* 2.8* 2.4* 2.6*   No results for input(s): LIPASE, AMYLASE in the last 168 hours. Recent Labs  Lab 07/19/20 1008 07/23/20 1323  AMMONIA 46* 47*   Coagulation Profile: Recent Labs  Lab 07/19/20 1008 07/20/20 0508 07/21/20 0611  INR 1.7* 2.2* 2.1*   Cardiac Enzymes: No results for input(s): CKTOTAL, CKMB, CKMBINDEX, TROPONINI in the last 168 hours. BNP (last 3 results) No results for input(s): PROBNP in the last 8760 hours. HbA1C: No results for input(s): HGBA1C in the last 72 hours. CBG: Recent Labs  Lab 07/24/20 1140 07/24/20 1651 07/24/20 2331 07/25/20 0802 07/25/20 1123  GLUCAP 145* 98 111* 73 115*   Lipid Profile: No results for input(s): CHOL, HDL, LDLCALC, TRIG, CHOLHDL, LDLDIRECT in the last  72 hours. Thyroid Function Tests: No results for input(s): TSH, T4TOTAL, FREET4, T3FREE,  THYROIDAB in the last 72 hours. Anemia Panel: No results for input(s): VITAMINB12, FOLATE, FERRITIN, TIBC, IRON, RETICCTPCT in the last 72 hours. Sepsis Labs: Recent Labs  Lab 07/19/20 1253 07/19/20 1455 07/19/20 1632 07/20/20 0508 07/21/20 0611 07/22/20 0604  PROCALCITON  --   --  4.94  --  3.78  --   LATICACIDVEN 3.2* 3.2*  --  1.2  --  1.8    Recent Results (from the past 240 hour(s))  Resp Panel by RT-PCR (Flu A&B, Covid) Nasopharyngeal Swab     Status: None   Collection Time: 07/19/20  9:59 AM   Specimen: Nasopharyngeal Swab; Nasopharyngeal(NP) swabs in vial transport medium  Result Value Ref Range Status   SARS Coronavirus 2 by RT PCR NEGATIVE NEGATIVE Final    Comment: (NOTE) SARS-CoV-2 target nucleic acids are NOT DETECTED.  The SARS-CoV-2 RNA is generally detectable in upper respiratory specimens during the acute phase of infection. The lowest concentration of SARS-CoV-2 viral copies this assay can detect is 138 copies/mL. A negative result does not preclude SARS-Cov-2 infection and should not be used as the sole basis for treatment or other patient management decisions. A negative result may occur with  improper specimen collection/handling, submission of specimen other than nasopharyngeal swab, presence of viral mutation(s) within the areas targeted by this assay, and inadequate number of viral copies(<138 copies/mL). A negative result must be combined with clinical observations, patient history, and epidemiological information. The expected result is Negative.  Fact Sheet for Patients:  EntrepreneurPulse.com.au  Fact Sheet for Healthcare Providers:  IncredibleEmployment.be  This test is no t yet approved or cleared by the [place] FDA and  has been authorized for detection and/or diagnosis of SARS-CoV-2 by FDA under an Emergency  Use Authorization (EUA). This EUA will remain  in effect (meaning this test can be used) for the duration of the COVID-19 declaration under Section 564(b)(1) of the Act, 21 U.S.C.section 360bbb-3(b)(1), unless the authorization is terminated  or revoked sooner.       Influenza A by PCR NEGATIVE NEGATIVE Final   Influenza B by PCR NEGATIVE NEGATIVE Final    Comment: (NOTE) The Xpert Xpress SARS-CoV-2/FLU/RSV plus assay is intended as an aid in the diagnosis of influenza from Nasopharyngeal swab specimens and should not be used as a sole basis for treatment. Nasal washings and aspirates are unacceptable for Xpert Xpress SARS-CoV-2/FLU/RSV testing.  Fact Sheet for Patients: EntrepreneurPulse.com.au  Fact Sheet for Healthcare Providers: IncredibleEmployment.be  This test is not yet approved or cleared by the [place] FDA and has been authorized for detection and/or diagnosis of SARS-CoV-2 by FDA under an Emergency Use Authorization (EUA). This EUA will remain in effect (meaning this test can be used) for the duration of the COVID-19 declaration under Section 564(b)(1) of the Act, 21 U.S.C. section 360bbb-3(b)(1), unless the authorization is terminated or revoked.  Performed at [facility], [address] [place], [place] [address]   Blood Culture (routine x 2)     Status: None   Collection Time: 07/19/20 10:10 AM   Specimen: BLOOD RIGHT FOREARM  Result Value Ref Range Status   Specimen Description   Final    BLOOD RIGHT FOREARM BOTTLES DRAWN AEROBIC AND ANAEROBIC   Special Requests Blood Culture adequate volume  Final   Culture   Final    NO GROWTH 5 DAYS Performed at [facility], [address], [place], [place] [address]    Report Status 07/24/2020 FINAL  Final  Blood Culture (routine x  2)     Status: None   Collection Time: 07/19/20 10:10 AM   Specimen: Left Antecubital; Blood  Result Value Ref Range Status   Specimen  Description   Final    LEFT ANTECUBITAL BOTTLES DRAWN AEROBIC AND ANAEROBIC   Special Requests   Final    Blood Culture results may not be optimal due to an excessive volume of blood received in culture bottles   Culture   Final    NO GROWTH 5 DAYS Performed at [facility], [address] [place], [place] [address]    Report Status 07/24/2020 FINAL  Final  Urine culture     Status: Abnormal   Collection Time: 07/19/20 11:30 AM   Specimen: In/Out Cath Urine  Result Value Ref Range Status   Specimen Description   Final    IN/OUT CATH URINE Performed at [facility], [address] [place], [place] [address]    Special Requests   Final    NONE Performed at [facility], [address] [place], [place] [address]    Culture >=100,000 COLONIES/mL ESCHERICHIA COLI (A)  Final   Report Status 07/22/2020 FINAL  Final   Organism ID, Bacteria ESCHERICHIA COLI (A)  Final      Susceptibility   Escherichia coli - MIC*    AMPICILLIN <=2 SENSITIVE Sensitive     CEFAZOLIN <=4 SENSITIVE Sensitive     CEFEPIME <=0.12 SENSITIVE Sensitive     CEFTRIAXONE <=0.25 SENSITIVE Sensitive     CIPROFLOXACIN <=0.25 SENSITIVE Sensitive     GENTAMICIN <=1 SENSITIVE Sensitive     IMIPENEM <=0.25 SENSITIVE Sensitive     NITROFURANTOIN <=16 SENSITIVE Sensitive     TRIMETH/SULFA <=20 SENSITIVE Sensitive     AMPICILLIN/SULBACTAM <=2 SENSITIVE Sensitive     PIP/TAZO <=4 SENSITIVE Sensitive     * >=100,000 COLONIES/mL ESCHERICHIA COLI  MRSA PCR Screening     Status: Abnormal   Collection Time: 07/19/20  5:18 PM   Specimen: Nasopharyngeal  Result Value Ref Range Status   MRSA by PCR POSITIVE (A) NEGATIVE Final    Comment:        The GeneXpert MRSA Assay (FDA approved for NASAL specimens only), is one component of a comprehensive MRSA colonization surveillance program. It is not intended to diagnose MRSA infection nor to guide or monitor treatment for MRSA infections. RESULT CALLED TO, READ BACK BY AND  VERIFIED WITH: LEDWELL, L@[email or web address]  by [email or web address],b 5.2.22 Performed at [facility], [address], [place], [place] [address]   C Difficile Quick Screen (NO PCR Reflex)     Status: None   Collection Time: 07/24/20  4:51 PM   Specimen: Stool  Result Value Ref Range Status   C Diff antigen NEGATIVE NEGATIVE Final   C Diff toxin NEGATIVE NEGATIVE Final   C Diff interpretation No C. difficile detected.  Final    Comment: Performed at [facility], [address] [place], [place] [address]         Radiology Studies: DG Chest 1 View  Result Date: 07/25/2020 CLINICAL DATA:  Acute hypoxia with respiratory failure.  Diabetes. EXAM: CHEST  1 VIEW COMPARISON:  07/21/2020 FINDINGS: Lungs are adequately inflated and demonstrate slight interval worsening bilateral airspace consolidation most prominent over the upper lobes. Findings most suggestive of multifocal infection. No evidence of effusion. Improvement to resolution of small effusions. Cardiomediastinal silhouette and remainder of the exam is unchanged. IMPRESSION: Slight interval worsening bilateral airspace process most prominent over the upper lobes likely multifocal infection. Electronically  Signed   By: Marin Olp M.D.   On: 07/25/2020 10:40        Scheduled Meds: . (feeding supplement) PROSource Plus  30 mL Oral BID BM  . acetylcysteine  2 mL Nebulization BID  . Chlorhexidine Gluconate Cloth  6 each Topical Q0600  . feeding supplement  237 mL Oral BID BM  . insulin aspart  0-15 Units Subcutaneous TID WC  . insulin aspart  0-5 Units Subcutaneous QHS  . insulin aspart  3 Units Subcutaneous TID WC  . insulin detemir  10 Units Subcutaneous BID  . ipratropium-albuterol  3 mL Nebulization Q6H  . levothyroxine  50 mcg Oral QAC breakfast  . lipase/protease/amylase  24,000 Units Oral TID AC  . mouth rinse  15 mL Mouth Rinse BID  . multivitamin with minerals  1 tablet Oral Daily  . potassium chloride  40 mEq Oral Once  . rifaximin  550 mg  Oral BID   Continuous Infusions: . albuterol    . potassium chloride 10 mEq (07/25/20 1238)     LOS: 6 days    Time spent: 35 minutes    Joan Larrivee D Manuella Ghazi, DO [facility]  If 7PM-7AM, please contact night-coverage [email or web address] 07/25/2020, 1:11 PM

## 2020-07-25 NOTE — Progress Notes (Signed)
Pharmacy Antibiotic Note  [name] is a 55 y.o. female admitted on 07/19/2020 with pneumonia.  Pharmacy has been consulted for zosyn dosing.  Plan: Vancomycin 726m IV every 8 hours.  Goal trough 15-20 mcg/mL. Zosyn 3.375g IV q8h (4 hour infusion).  Height: 5' 2"  (157.5 cm) Weight: 60.9 kg (134 lb 4.2 oz) IBW/kg (Calculated) : 50.1  Temp (24hrs), Avg:99 F (37.2 C), Min:98.6 F (37 C), Max:99.4 F (37.4 C)  Recent Labs  Lab 07/19/20 1151 07/19/20 1253 07/19/20 1455 07/20/20 0508 07/20/20 0916905/03/22 0450305/04/22 0604 07/22/20 2140 07/23/20 0888205/06/22 0635 07/25/20 0705  WBC  --   --   --   --  2.6* 3.5* 4.0  --   --  6.0 7.3  CREATININE  --   --   --  0.58  --  0.48 0.52 0.66 0.38* 0.34* <0.30*  LATICACIDVEN 2.8* 3.2* 3.2* 1.2  --   --  1.8  --   --   --   --     CrCl cannot be calculated (This lab value cannot be used to calculate CrCl because it is not a number: <0.30).    Allergies  Allergen Reactions  . Acetaminophen Hives and Other (See Comments)    Tolerates Fioricet  . Cephalexin Itching, Swelling and Other (See Comments)    Tongue swells and makes throat itchy- can tolerate Zosyn  . Ciprofloxacin Hives  . Hydromorphone Hives  . Sulfamethoxazole-Trimethoprim Shortness Of Breath, Swelling and Other (See Comments)    Tongue swells   . Abilify [Aripiprazole]     "I did noyt like the way this made me feel."    Antimicrobials this admission: 5/1-5/3, 5/7 vancomycin >>  5/1 x 1, 5/7 zosyn 5/4-5/7 augmentin  xifaxan PTA >>   Microbiology results: 5/4 Ucx: pan sensitive e coli  5/2 MRSA PCR: positive 5/1 Bcx: NGTD  5/6 C Diff: negative   Thank you for allowing pharmacy to be a part of this patient's care.  GDonna ChristenCoffee 07/25/2020 1:19 PM

## 2020-07-26 ENCOUNTER — Inpatient Hospital Stay: Payer: Self-pay

## 2020-07-26 DIAGNOSIS — R4182 Altered mental status, unspecified: Secondary | ICD-10-CM | POA: Diagnosis not present

## 2020-07-26 LAB — PROCALCITONIN: Procalcitonin: 0.6 ng/mL

## 2020-07-26 LAB — CBC
HCT: 24.6 % — ABNORMAL LOW (ref 36.0–46.0)
Hemoglobin: 7.9 g/dL — ABNORMAL LOW (ref 12.0–15.0)
MCH: 34.5 pg — ABNORMAL HIGH (ref 26.0–34.0)
MCHC: 32.1 g/dL (ref 30.0–36.0)
MCV: 107.4 fL — ABNORMAL HIGH (ref 80.0–100.0)
Platelets: 61 10*3/uL — ABNORMAL LOW (ref 150–400)
RBC: 2.29 MIL/uL — ABNORMAL LOW (ref 3.87–5.11)
RDW: 17.2 % — ABNORMAL HIGH (ref 11.5–15.5)
WBC: 7.1 10*3/uL (ref 4.0–10.5)
nRBC: 0.3 % — ABNORMAL HIGH (ref 0.0–0.2)

## 2020-07-26 LAB — COMPREHENSIVE METABOLIC PANEL
ALT: 18 U/L (ref 0–44)
AST: 59 U/L — ABNORMAL HIGH (ref 15–41)
Albumin: 2.2 g/dL — ABNORMAL LOW (ref 3.5–5.0)
Alkaline Phosphatase: 62 U/L (ref 38–126)
Anion gap: 4 — ABNORMAL LOW (ref 5–15)
BUN: 24 mg/dL — ABNORMAL HIGH (ref 6–20)
CO2: 30 mmol/L (ref 22–32)
Calcium: 7.7 mg/dL — ABNORMAL LOW (ref 8.9–10.3)
Chloride: 105 mmol/L (ref 98–111)
Creatinine, Ser: 0.35 mg/dL — ABNORMAL LOW (ref 0.44–1.00)
GFR, Estimated: >60 mL/min (ref >60)
Glucose, Bld: 97 mg/dL (ref 70–99)
Potassium: 2.4 mmol/L — CL (ref 3.5–5.1)
Sodium: 139 mmol/L (ref 135–145)
Total Bilirubin: 10.6 mg/dL — ABNORMAL HIGH (ref 0.3–1.2)
Total Protein: 4.5 g/dL — ABNORMAL LOW (ref 6.5–8.1)

## 2020-07-26 LAB — AMMONIA: Ammonia: 13 umol/L (ref 9–35)

## 2020-07-26 LAB — GLUCOSE, CAPILLARY
Glucose-Capillary: 95 mg/dL (ref 70–99)
Glucose-Capillary: 222 mg/dL — ABNORMAL HIGH (ref 70–99)
Glucose-Capillary: 144 mg/dL — ABNORMAL HIGH (ref 70–99)
Glucose-Capillary: 161 mg/dL — ABNORMAL HIGH (ref 70–99)
Glucose-Capillary: 146 mg/dL — ABNORMAL HIGH (ref 70–99)

## 2020-07-26 LAB — BRAIN NATRIURETIC PEPTIDE: B Natriuretic Peptide: 216 pg/mL — ABNORMAL HIGH (ref 0.0–100.0)

## 2020-07-26 LAB — MAGNESIUM: Magnesium: 1.8 mg/dL (ref 1.7–2.4)

## 2020-07-26 MED ORDER — POTASSIUM CHLORIDE CRYS ER 20 MEQ PO TBCR
40.0000 meq | EXTENDED_RELEASE_TABLET | Freq: Once | ORAL | Status: AC
  Administered 2020-07-26: 40 meq via ORAL
  Filled 2020-07-26: qty 2

## 2020-07-26 MED ORDER — IPRATROPIUM-ALBUTEROL 0.5-2.5 (3) MG/3ML IN SOLN
3.0000 mL | Freq: Three times a day (TID) | RESPIRATORY_TRACT | Status: DC
  Administered 2020-07-27 – 2020-07-31 (×13): 3 mL via RESPIRATORY_TRACT
  Filled 2020-07-26 (×14): qty 3

## 2020-07-26 MED ORDER — POTASSIUM CHLORIDE 10 MEQ/100ML IV SOLN
10.0000 meq | INTRAVENOUS | Status: AC
  Administered 2020-07-26 (×5): 10 meq via INTRAVENOUS
  Filled 2020-07-26 (×5): qty 100

## 2020-07-26 NOTE — Progress Notes (Signed)
[facility] called and said someone would be here at 1415 to place a PICC line.

## 2020-07-26 NOTE — Plan of Care (Signed)
  Problem: Education: Goal: Knowledge of General Education information will improve Description Including pain rating scale, medication(s)/side effects and non-pharmacologic comfort measures Outcome: Progressing   Problem: Health Behavior/Discharge Planning: Goal: Ability to manage health-related needs will improve Outcome: Progressing   

## 2020-07-26 NOTE — Progress Notes (Signed)
Called [facility] for PICC placement.

## 2020-07-26 NOTE — Evaluation (Signed)
Clinical/Bedside Swallow Evaluation Patient Details  Name: [name] MRN: [id number] Date of Birth: 01-06-1966  Today's Date: 07/26/2020 Time: SLP Start Time (ACUTE ONLY): 65 SLP Stop Time (ACUTE ONLY): 1242 SLP Time Calculation (min) (ACUTE ONLY): 22.05 min  Past Medical History:  Past Medical History:  Diagnosis Date  . Degenerative joint disease   . Diabetes mellitus without complication ([place])   . Gastroesophageal reflux   . Pancreatitis   . Panic attack   . Sleep apnea   . Thrombocytopenia ([place])    Past Surgical History:  Past Surgical History:  Procedure Laterality Date  . CHOLECYSTECTOMY    . SHOULDER SURGERY     left   . tongue sx. titanium screw placed to hold tongue down    . TOTAL THYROIDECTOMY     HPI:  [name] is a 55 y.o. female with medical history significant of cirrhosis, insulin-dependent diabetes on insulin pump, chronic pancreatitis, thrombocytopenia (usually in the 60s), and hypothyroidism who was admitted on 5/1 for hepatic encephalopathy. See HPI and H&P for details. On admission, pt continued to have confusion and noted new-onset neck pain.  She was admitted with sepsis secondary to E. coli UTI without findings of bacteria, but now appears to have worsening mentation with potential sepsis related to multifocal pneumonia.  Her antibiotic coverage has been broadened to vancomycin and Zosyn with improvement noted.  Oxygen requirements are diminishing, and diarrhea is improving as well with negative work-up. Continue to wean O2 requirements and then work with PT prior to DC.   Assessment / Plan / Recommendation Clinical Impression  Clinical swallowing evaluation completed while Pt was sitting upright in bed, Pt consumed thin liquids, regular textures and puree textures without overt s/sx of aspiration. Pt did require additional time and a liquid wash to clear oral cavity during/after solid trials. She reports it "takes her a while" to thoroughly  masticate secondary to her edentulous status. SLP reviewed universal aspiration precautions. Recommend continue with D3/Mech soft diet and thin liquids; meds are ok whole with liquids. There are no further ST needs noted at this time, ST will sign off. Thank you, SLP Visit Diagnosis: Dysphagia, unspecified (R13.10)    Aspiration Risk  Mild aspiration risk    Diet Recommendation Dysphagia 3 (Mech soft);Thin liquid   Liquid Administration via: Cup;Straw Medication Administration: Whole meds with liquid Supervision: Patient able to self feed;Intermittent supervision to cue for compensatory strategies Compensations: Minimize environmental distractions;Slow rate;Small sips/bites Postural Changes: Seated upright at 90 degrees    Other  Recommendations Oral Care Recommendations: Oral care BID   Follow up Recommendations None      Frequency and Duration            Prognosis        Swallow Study   General Date of Onset: 07/19/20 HPI: [name] is a 55 y.o. female with medical history significant of cirrhosis, insulin-dependent diabetes on insulin pump, chronic pancreatitis, thrombocytopenia (usually in the 60s), and hypothyroidism who was admitted on 5/1 for hepatic encephalopathy. See HPI and H&P for details. On admission, pt continued to have confusion and noted new-onset neck pain.  She was admitted with sepsis secondary to E. coli UTI without findings of bacteria, but now appears to have worsening mentation with potential sepsis related to multifocal pneumonia.  Her antibiotic coverage has been broadened to vancomycin and Zosyn with improvement noted.  Oxygen requirements are diminishing, and diarrhea is improving as well with negative work-up. Continue to wean O2  requirements and then work with PT prior to DC. Type of Study: Bedside Swallow Evaluation Previous Swallow Assessment: none in chart Diet Prior to this Study: Dysphagia 3 (soft);Thin liquids Temperature Spikes Noted:  No Respiratory Status: Nasal cannula (HFNC 7L/mn) History of Recent Intubation: No Behavior/Cognition: Alert;Cooperative;Pleasant mood Oral Cavity Assessment: Within Functional Limits Oral Care Completed by SLP: Recent completion by staff Oral Cavity - Dentition: Edentulous Vision: Functional for self-feeding Self-Feeding Abilities: Able to feed self Patient Positioning: Upright in bed Baseline Vocal Quality: Normal Volitional Cough: Strong Volitional Swallow: Able to elicit    Oral/Motor/Sensory Function Overall Oral Motor/Sensory Function: Within functional limits   Ice Chips     Thin Liquid Thin Liquid: Within functional limits    Nectar Thick Nectar Thick Liquid: Not tested   Honey Thick Honey Thick Liquid: Not tested   Puree Puree: Within functional limits   Solid     Solid: Within functional limits     [name] H. Roddie Mc, CCC-SLP Speech Language Pathologist  Wende Bushy 07/26/2020,12:42 PM

## 2020-07-26 NOTE — Progress Notes (Signed)
Patients oxygen has been reduced to 6 liters. She tends to not wear cannula properly in her nose. Her oxygen saturation drops quickly off oxygen.

## 2020-07-26 NOTE — Progress Notes (Signed)
Date and time results received: 07/26/20 8:50 AM  (use smartphrase ".now" to insert current time)  Test: Potassium Critical Value: 2.4  Name of Provider Notified: Dr. Manuella Ghazi  Orders Received? Or Actions Taken?: Actions Taken: Awaiting orders

## 2020-07-26 NOTE — Progress Notes (Signed)
PROGRESS NOTE    [name]  UQJ:[id number] DOB: [birth year] DOA: 07/19/2020 PCP: [facility]   Brief Narrative:   Joan Vadala Hernandezis a 55 y.o.femalewith medical history significant ofcirrhosis, insulin-dependent diabetes on insulin pump, chronic pancreatitis, thrombocytopenia (usually in the 60s), and hypothyroidism who was admitted on 5/1 for hepatic encephalopathy. See HPI and H&P for details. On admission, pt continued to have confusion and noted new-onset neck pain.  She was admitted with sepsis secondary to E. coli UTI without findings of bacteria, but now appears to have worsening mentation with potential sepsis related to multifocal pneumonia.  Her antibiotic coverage has been broadened to vancomycin and Zosyn with improvement noted.  Oxygen requirements are diminishing, and diarrhea is improving as well with negative work-up. Continue to wean O2 requirements and then work with PT prior to DC.  Assessment & Plan:   Active Problems:   Thrombocytopenia ([place])   Alcoholic cirrhosis of liver without ascites (HCC)   Essential hypertension   Acquired hypothyroidism   Type 2 diabetes mellitus ([place])   Coagulopathy (HCC)   Altered mental status   Sepsis ([place])   Acute lower UTI   DNR (do not resuscitate)   Pancreatic insufficiency   Malnutrition of moderate degree  Acute metabolic encephalopathy-multifactorial -Appears to be related to sepsis with multifocal pneumonia, initially with E. coli UTI -Possibly related to hypoxemia she continues to pull oxygen off -Expand coverage to vancomycin and Zosyn 5/7, discontinue Augmentin -Follow-up chest x-ray and ABG -Xanax as needed for anxiety episodes -We will consider palliative consultation if she does not begin to improve as she may have a component of failure to thrive  Acute on chronic hypoxemic respiratory failure with multifocal pneumonia- likely HCAP -Improving, continue to wean O2 -Question if she is actually  aspirating given her worsening mentation -Dysphagia 1 diet and SLP evaluation ordered, but now to soft with improved mentation on 5/8 -Vancomycin and Zosyn initiated 5/7 -MRSA PCR positive -DuoNeb scheduled every 6 hours -Mucomyst -Incentive spirometry/flutter valve -2D echo ordered 5/7 with no acute findings; LVEF 65-70%  Persistent hypokalemia with noted diarrhea -Lactulose currently held -Continue repletion and monitor -Stool C. difficile negative -GI panel negative  Thrombocytopenia/elevated INR secondary to alcoholic liver cirrhosis-stable -Continue to monitor -Holding lactulose secondary to above  Anemia-stable -Monitor repeat CBC  Type 2 diabetes -Holding Levemir until appetite improves -Advance to soft 5/8 -Continue SSI  Hypothyroidism -Continue levothyroxine  Pancreatic insufficiency in the setting of chronic pancreatitis -Continue home Creon during meals  DVT prophylaxis: SCDs Code Status: DNR Family Communication: Discussed with husband at bedside 5/7 Disposition Plan:  Status is: Inpatient  Remains inpatient appropriate because:Altered mental status, IV treatments appropriate due to intensity of illness or inability to take PO and Inpatient level of care appropriate due to severity of illness   Dispo: The patient is from: Home  Anticipated d/c is to: Home  Patient currently is not medically stable to d/c.              Difficult to place patient No   Nutritional Assessment:  The patient's BMI is: Body mass index is 24.56 kg/m.Marland Kitchen  Seen by dietician.  I agree with the assessment and plan as outlined below:  Nutrition Status: Nutrition Problem: Moderate Malnutrition Etiology: chronic illness (alchololic cirrhosis, pancreatitis) Signs/Symptoms: mild muscle depletion,mild fat depletion,energy intake < 75% for > or equal to 1 month Interventions: Ensure Enlive (each supplement provides 350kcal and 20 grams of  protein),MVI,Prostat  Skin Assessment:  I have  examined the patient's skin and I agree with the wound assessment as performed by the wound care RN as outlined below:  Pressure Injury 07/19/20 Buttocks Right Stage 2 -  Partial thickness loss of dermis presenting as a shallow open injury with a red, pink wound bed without slough. (Active)  07/19/20 1905  Location: Buttocks  Location Orientation: Right  Staging: Stage 2 -  Partial thickness loss of dermis presenting as a shallow open injury with a red, pink wound bed without slough.  Wound Description (Comments):   Present on Admission: Yes    Consultants:   None  Procedures:   See below  Antimicrobials:  Anti-infectives (From admission, onward)   Start     Dose/Rate Route Frequency Ordered Stop   07/26/20 0000  vancomycin (VANCOREADY) IVPB 750 mg/150 mL        750 mg 150 mL/hr over 60 Minutes Intravenous Every 8 hours 07/25/20 1332     07/25/20 1415  piperacillin-tazobactam (ZOSYN) IVPB 3.375 g        3.375 g 12.5 mL/hr over 240 Minutes Intravenous Every 8 hours 07/25/20 1318     07/25/20 1415  vancomycin (VANCOREADY) IVPB 1250 mg/250 mL        1,250 mg 166.7 mL/hr over 90 Minutes Intravenous  Once 07/25/20 1318 07/25/20 1751   07/22/20 1000  amoxicillin-clavulanate (AUGMENTIN) 875-125 MG per tablet 1 tablet  Status:  Discontinued        1 tablet Oral Every 12 hours 07/22/20 0805 07/25/20 1307   07/22/20 0830  cefTRIAXone (ROCEPHIN) 1 g in sodium chloride 0.9 % 100 mL IVPB  Status:  Discontinued        1 g 200 mL/hr over 30 Minutes Intravenous Every 24 hours 07/22/20 0731 07/22/20 0802   07/20/20 2000  piperacillin-tazobactam (ZOSYN) IVPB 3.375 g  Status:  Discontinued        3.375 g 12.5 mL/hr over 240 Minutes Intravenous Every 8 hours 07/20/20 1116 07/22/20 0731   07/20/20 1200  piperacillin-tazobactam (ZOSYN) IVPB 3.375 g        3.375 g 100 mL/hr over 30 Minutes Intravenous  Once 07/20/20 1115 07/20/20 1249    07/20/20 0500  vancomycin (VANCOREADY) IVPB 750 mg/150 mL  Status:  Discontinued        750 mg 150 mL/hr over 60 Minutes Intravenous Every 12 hours 07/19/20 1740 07/21/20 1005   07/20/20 0300  aztreonam (AZACTAM) 2 g in sodium chloride 0.9 % 100 mL IVPB  Status:  Discontinued        2 g 200 mL/hr over 30 Minutes Intravenous Every 8 hours 07/19/20 1740 07/20/20 1114   07/19/20 2200  rifaximin (XIFAXAN) tablet 550 mg        550 mg Oral 2 times daily 07/19/20 1620     07/19/20 1630  aztreonam (AZACTAM) 2 g in sodium chloride 0.9 % 100 mL IVPB        2 g 200 mL/hr over 30 Minutes Intravenous  Once 07/19/20 1620 07/19/20 1835   07/19/20 1630  metroNIDAZOLE (FLAGYL) IVPB 500 mg  Status:  Discontinued        500 mg 100 mL/hr over 60 Minutes Intravenous Every 8 hours 07/19/20 1620 07/20/20 1114   07/19/20 1630  vancomycin (VANCOCIN) IVPB 1000 mg/200 mL premix        1,000 mg 200 mL/hr over 60 Minutes Intravenous  Once 07/19/20 1620 07/19/20 2000   07/19/20 1145  piperacillin-tazobactam (ZOSYN) IVPB 3.375 g  3.375 g 100 mL/hr over 30 Minutes Intravenous  Once 07/19/20 1130 07/19/20 1301   07/19/20 1145  azithromycin (ZITHROMAX) 500 mg in sodium chloride 0.9 % 250 mL IVPB  Status:  Discontinued        500 mg 250 mL/hr over 60 Minutes Intravenous Every 24 hours 07/19/20 1130 07/19/20 1620       Subjective: Patient seen and evaluated today with no new acute complaints or concerns. No acute concerns or events noted overnight.  Her delirium has improved and she is more alert and awake.  Objective: Vitals:   07/25/20 2159 07/26/20 0449 07/26/20 0602 07/26/20 0835  BP: (!) 132/57  (!) 121/48   Pulse: 94  96   Resp: 15  19   Temp: 99 F (37.2 C)  98.7 F (37.1 C)   TempSrc:      SpO2: 100% 92% 92% 94%  Weight:   61.2 kg   Height:        Intake/Output Summary (Last 24 hours) at 07/26/2020 1152 Last data filed at 07/26/2020 0648 Gross per 24 hour  Intake 1510.7 ml  Output 1 ml  Net  1509.7 ml   Filed Weights   07/23/20 0500 07/25/20 0500 07/26/20 0602  Weight: 66 kg 60.9 kg 61.2 kg    Examination:  General exam: Appears calm and comfortable  Respiratory system: Clear to auscultation. Respiratory effort normal. On 7L [place]. Cardiovascular system: S1 & S2 heard, RRR.  Gastrointestinal system: Abdomen is soft Central nervous system: Alert and awake Extremities: No edema Skin: No significant lesions noted Psychiatry: Flat affect.    Data Reviewed: I have personally reviewed following labs and imaging studies  CBC: Recent Labs  Lab 07/21/20 0611 07/22/20 0604 07/24/20 0635 07/25/20 0705 07/26/20 0618  WBC 3.5* 4.0 6.0 7.3 7.1  HGB 8.5* 9.0* 8.9* 9.4* 7.9*  HCT 26.1* 28.3* 28.2* 29.4* 24.6*  MCV 106.5* 108.8* 107.6* 107.3* 107.4*  PLT 34* 36* 49* 57* 61*   Basic Metabolic Panel: Recent Labs  Lab 07/22/20 0604 07/22/20 2140 07/23/20 0634 07/24/20 0635 07/25/20 0705 07/26/20 0618  NA 140 135 139 142 145 139  K 4.1 3.8 3.7 2.8* 2.9* 2.4*  CL 111 104 107 106 109 105  CO2 24 26 28 28 29 30   GLUCOSE 188* 518* 189* 171* 78 97  BUN 26* 30* 32* 29* 28* 24*  CREATININE 0.52 0.66 0.38* 0.34* <0.30* 0.35*  CALCIUM 8.7* 9.0 9.0 9.1 8.8* 7.7*  MG 1.9  --  1.9 1.6* 1.9 1.8   GFR: Estimated Creatinine Clearance: 66.8 mL/min (A) (by C-G formula based on SCr of 0.35 mg/dL (L)). Liver Function Tests: Recent Labs  Lab 07/21/20 0611 07/22/20 0604 07/23/20 0634 07/24/20 0635 07/26/20 0618  AST 55* 46* 38 53* 59*  ALT 16 16 17 19 18   ALKPHOS 66 74 72 77 62  BILITOT 6.2* 6.9* 5.2* 7.8* 10.6*  PROT 5.4* 5.6* 5.0* 5.4* 4.5*  ALBUMIN 2.9* 2.8* 2.4* 2.6* 2.2*   No results for input(s): LIPASE, AMYLASE in the last 168 hours. Recent Labs  Lab 07/23/20 1323 07/26/20 0618  AMMONIA 47* 13   Coagulation Profile: Recent Labs  Lab 07/20/20 0508 07/21/20 0611  INR 2.2* 2.1*   Cardiac Enzymes: No results for input(s): CKTOTAL, CKMB, CKMBINDEX, TROPONINI  in the last 168 hours. BNP (last 3 results) No results for input(s): PROBNP in the last 8760 hours. HbA1C: No results for input(s): HGBA1C in the last 72 hours. CBG: Recent Labs  Lab 07/25/20  1123 07/25/20 1617 07/25/20 2203 07/26/20 0741 07/26/20 1139  GLUCAP 115* 193* 255* 95 222*   Lipid Profile: No results for input(s): CHOL, HDL, LDLCALC, TRIG, CHOLHDL, LDLDIRECT in the last 72 hours. Thyroid Function Tests: No results for input(s): TSH, T4TOTAL, FREET4, T3FREE, THYROIDAB in the last 72 hours. Anemia Panel: No results for input(s): VITAMINB12, FOLATE, FERRITIN, TIBC, IRON, RETICCTPCT in the last 72 hours. Sepsis Labs: Recent Labs  Lab 07/19/20 1253 07/19/20 1455 07/19/20 1632 07/20/20 0508 07/21/20 0626 07/22/20 0604 07/25/20 0705 07/26/20 0618  PROCALCITON  --   --  4.94  --  3.78  --  1.26 0.60  LATICACIDVEN 3.2* 3.2*  --  1.2  --  1.8  --   --     Recent Results (from the past 240 hour(s))  Resp Panel by RT-PCR (Flu A&B, Covid) Nasopharyngeal Swab     Status: None   Collection Time: 07/19/20  9:59 AM   Specimen: Nasopharyngeal Swab; Nasopharyngeal(NP) swabs in vial transport medium  Result Value Ref Range Status   SARS Coronavirus 2 by RT PCR NEGATIVE NEGATIVE Final    Comment: (NOTE) SARS-CoV-2 target nucleic acids are NOT DETECTED.  The SARS-CoV-2 RNA is generally detectable in upper respiratory specimens during the acute phase of infection. The lowest concentration of SARS-CoV-2 viral copies this assay can detect is 138 copies/mL. A negative result does not preclude SARS-Cov-2 infection and should not be used as the sole basis for treatment or other patient management decisions. A negative result may occur with  improper specimen collection/handling, submission of specimen other than nasopharyngeal swab, presence of viral mutation(s) within the areas targeted by this assay, and inadequate number of viral copies(<138 copies/mL). A negative result must  be combined with clinical observations, patient history, and epidemiological information. The expected result is Negative.  Fact Sheet for Patients:  EntrepreneurPulse.com.au  Fact Sheet for Healthcare Providers:  IncredibleEmployment.be  This test is no t yet approved or cleared by the [place] FDA and  has been authorized for detection and/or diagnosis of SARS-CoV-2 by FDA under an Emergency Use Authorization (EUA). This EUA will remain  in effect (meaning this test can be used) for the duration of the COVID-19 declaration under Section 564(b)(1) of the Act, 21 U.S.C.section 360bbb-3(b)(1), unless the authorization is terminated  or revoked sooner.       Influenza A by PCR NEGATIVE NEGATIVE Final   Influenza B by PCR NEGATIVE NEGATIVE Final    Comment: (NOTE) The Xpert Xpress SARS-CoV-2/FLU/RSV plus assay is intended as an aid in the diagnosis of influenza from Nasopharyngeal swab specimens and should not be used as a sole basis for treatment. Nasal washings and aspirates are unacceptable for Xpert Xpress SARS-CoV-2/FLU/RSV testing.  Fact Sheet for Patients: EntrepreneurPulse.com.au  Fact Sheet for Healthcare Providers: IncredibleEmployment.be  This test is not yet approved or cleared by the [place] FDA and has been authorized for detection and/or diagnosis of SARS-CoV-2 by FDA under an Emergency Use Authorization (EUA). This EUA will remain in effect (meaning this test can be used) for the duration of the COVID-19 declaration under Section 564(b)(1) of the Act, 21 U.S.C. section 360bbb-3(b)(1), unless the authorization is terminated or revoked.  Performed at [facility], [address] [place], [place] [address]   Blood Culture (routine x 2)     Status: None   Collection Time: 07/19/20 10:10 AM   Specimen: BLOOD RIGHT FOREARM  Result Value Ref Range Status   Specimen Description    Final  BLOOD RIGHT FOREARM BOTTLES DRAWN AEROBIC AND ANAEROBIC   Special Requests Blood Culture adequate volume  Final   Culture   Final    NO GROWTH 5 DAYS Performed at [facility], [address], [place], [place] [address]    Report Status 07/24/2020 FINAL  Final  Blood Culture (routine x 2)     Status: None   Collection Time: 07/19/20 10:10 AM   Specimen: Left Antecubital; Blood  Result Value Ref Range Status   Specimen Description   Final    LEFT ANTECUBITAL BOTTLES DRAWN AEROBIC AND ANAEROBIC   Special Requests   Final    Blood Culture results may not be optimal due to an excessive volume of blood received in culture bottles   Culture   Final    NO GROWTH 5 DAYS Performed at [facility], [address] [place], [place] [address]    Report Status 07/24/2020 FINAL  Final  Urine culture     Status: Abnormal   Collection Time: 07/19/20 11:30 AM   Specimen: In/Out Cath Urine  Result Value Ref Range Status   Specimen Description   Final    IN/OUT CATH URINE Performed at [facility], [address], [place], [place] [address]    Special Requests   Final    NONE Performed at [facility], [address] [place], [place] [address]    Culture >=100,000 COLONIES/mL ESCHERICHIA COLI (A)  Final   Report Status 07/22/2020 FINAL  Final   Organism ID, Bacteria ESCHERICHIA COLI (A)  Final      Susceptibility   Escherichia coli - MIC*    AMPICILLIN <=2 SENSITIVE Sensitive     CEFAZOLIN <=4 SENSITIVE Sensitive     CEFEPIME <=0.12 SENSITIVE Sensitive     CEFTRIAXONE <=0.25 SENSITIVE Sensitive     CIPROFLOXACIN <=0.25 SENSITIVE Sensitive     GENTAMICIN <=1 SENSITIVE Sensitive     IMIPENEM <=0.25 SENSITIVE Sensitive     NITROFURANTOIN <=16 SENSITIVE Sensitive     TRIMETH/SULFA <=20 SENSITIVE Sensitive     AMPICILLIN/SULBACTAM <=2 SENSITIVE Sensitive     PIP/TAZO <=4 SENSITIVE Sensitive     * >=100,000 COLONIES/mL ESCHERICHIA COLI  MRSA PCR Screening     Status: Abnormal    Collection Time: 07/19/20  5:18 PM   Specimen: Nasopharyngeal  Result Value Ref Range Status   MRSA by PCR POSITIVE (A) NEGATIVE Final    Comment:        The GeneXpert MRSA Assay (FDA approved for NASAL specimens only), is one component of a comprehensive MRSA colonization surveillance program. It is not intended to diagnose MRSA infection nor to guide or monitor treatment for MRSA infections. RESULT CALLED TO, READ BACK BY AND VERIFIED WITH: LEDWELL, L@1145  by [email or web address],b 5.2.22 Performed at [facility], [address], [place], [place] [address]   Gastrointestinal Panel by PCR , Stool     Status: None   Collection Time: 07/24/20  4:51 PM   Specimen: Stool  Result Value Ref Range Status   Campylobacter species NOT DETECTED NOT DETECTED Final   Plesimonas shigelloides NOT DETECTED NOT DETECTED Final   Salmonella species NOT DETECTED NOT DETECTED Final   Yersinia enterocolitica NOT DETECTED NOT DETECTED Final   Vibrio species NOT DETECTED NOT DETECTED Final   Vibrio cholerae NOT DETECTED NOT DETECTED Final   Enteroaggregative E coli (EAEC) NOT DETECTED NOT DETECTED Final   Enteropathogenic E coli (EPEC) NOT DETECTED NOT DETECTED Final   Enterotoxigenic E coli (ETEC) NOT DETECTED NOT  DETECTED Final   Shiga like toxin producing E coli (STEC) NOT DETECTED NOT DETECTED Final   Shigella/Enteroinvasive E coli (EIEC) NOT DETECTED NOT DETECTED Final   Cryptosporidium NOT DETECTED NOT DETECTED Final   Cyclospora cayetanensis NOT DETECTED NOT DETECTED Final   Entamoeba histolytica NOT DETECTED NOT DETECTED Final   Giardia lamblia NOT DETECTED NOT DETECTED Final   Adenovirus F40/41 NOT DETECTED NOT DETECTED Final   Astrovirus NOT DETECTED NOT DETECTED Final   Norovirus GI/GII NOT DETECTED NOT DETECTED Final   Rotavirus A NOT DETECTED NOT DETECTED Final   Sapovirus (I, II, IV, and V) NOT DETECTED NOT DETECTED Final    Comment: Performed at [facility], [address]  [place], [place] [address]  C Difficile Quick Screen (NO PCR Reflex)     Status: None   Collection Time: 07/24/20  4:51 PM   Specimen: Stool  Result Value Ref Range Status   C Diff antigen NEGATIVE NEGATIVE Final   C Diff toxin NEGATIVE NEGATIVE Final   C Diff interpretation No C. difficile detected.  Final    Comment: Performed at [facility], [address], [place], [place] [address]         Radiology Studies: DG Chest 1 View  Result Date: 07/25/2020 CLINICAL DATA:  Acute hypoxia with respiratory failure.  Diabetes. EXAM: CHEST  1 VIEW COMPARISON:  07/21/2020 FINDINGS: Lungs are adequately inflated and demonstrate slight interval worsening bilateral airspace consolidation most prominent over the upper lobes. Findings most suggestive of multifocal infection. No evidence of effusion. Improvement to resolution of small effusions. Cardiomediastinal silhouette and remainder of the exam is unchanged. IMPRESSION: Slight interval worsening bilateral airspace process most prominent over the upper lobes likely multifocal infection. Electronically Signed   By: Marin Olp M.D.   On: 07/25/2020 10:40   ECHOCARDIOGRAM COMPLETE  Result Date: 07/25/2020    ECHOCARDIOGRAM REPORT   Patient Name:   NEELA ZECCA Date of Exam: 07/25/2020 Medical Rec #:  [id number]         Height:       62.0 in Accession #:    [id number]        Weight:       134.3 lb Date of Birth:  29-Oct-1965         BSA:          1.614 m Patient Age:    61 years          BP:           138/65 mmHg Patient Gender: F                 HR:           100 bpm. Exam Location:  [facility] Procedure: 2D Echo, Cardiac Doppler and Color Doppler Indications:    Acute Respiratory Distress R06.03  History:        Patient has no prior history of Echocardiogram examinations.                 Risk Factors:Diabetes, Sleep Apnea and GERD.  Sonographer:    Jonelle Sidle Dance Referring Phys: [id number] [place]  1. Left ventricular ejection fraction, by  estimation, is 65 to 70%. The left ventricle has normal function. The left ventricle has no regional wall motion abnormalities. There is mild left ventricular hypertrophy. Indeterminate diastolic filling due to E-A fusion.  2. Right ventricular systolic function is normal. The right ventricular size is normal. There is normal pulmonary  artery systolic pressure. The estimated right ventricular systolic pressure is 46.5 mmHg.  3. Left atrial size was severely dilated.  4. The mitral valve is normal in structure. No evidence of mitral valve regurgitation. No evidence of mitral stenosis.  5. The aortic valve is grossly normal. There is mild calcification of the aortic valve. Aortic valve regurgitation is not visualized. Mild to moderate aortic valve sclerosis/calcification is present, with borderline findings of aortic stenosis, if present it is mild.  6. Increased flow velocities may be secondary to anemia, thyrotoxicosis, hyperdynamic or high flow state.  7. The inferior vena cava is dilated in size with >50% respiratory variability, suggesting right atrial pressure of 8 mmHg. FINDINGS  Left Ventricle: Left ventricular ejection fraction, by estimation, is 65 to 70%. The left ventricle has normal function. The left ventricle has no regional wall motion abnormalities. The left ventricular internal cavity size was normal in size. There is  mild left ventricular hypertrophy. Indeterminate diastolic filling due to E-A fusion. Right Ventricle: The right ventricular size is normal. No increase in right ventricular wall thickness. Right ventricular systolic function is normal. There is normal pulmonary artery systolic pressure. The tricuspid regurgitant velocity is 2.59 m/s, and  with an assumed right atrial pressure of 8 mmHg, the estimated right ventricular systolic pressure is 68.1 mmHg. Left Atrium: Left atrial size was severely dilated. Right Atrium: Right atrial size was normal in size. Pericardium: There is no evidence  of pericardial effusion. Mitral Valve: The mitral valve is normal in structure. Mild mitral annular calcification. No evidence of mitral valve regurgitation. No evidence of mitral valve stenosis. Tricuspid Valve: The tricuspid valve is normal in structure. Tricuspid valve regurgitation is trivial. No evidence of tricuspid stenosis. Aortic Valve: The aortic valve is grossly normal. There is mild calcification of the aortic valve. Aortic valve regurgitation is not visualized. Mild to moderate aortic valve sclerosis/calcification is present, without any evidence of aortic stenosis. Aortic valve mean gradient measures 14.0 mmHg. Aortic valve peak gradient measures 25.0 mmHg. Aortic valve area, by VTI measures 1.31 cm. Pulmonic Valve: The pulmonic valve was normal in structure. Pulmonic valve regurgitation is trivial. No evidence of pulmonic stenosis. Aorta: The aortic root is normal in size and structure. Venous: The inferior vena cava is dilated in size with greater than 50% respiratory variability, suggesting right atrial pressure of 8 mmHg. IAS/Shunts: The interatrial septum was not well visualized.  LEFT VENTRICLE PLAX 2D LVIDd:         5.01 cm  Diastology LVIDs:         3.27 cm  LV e' medial:    8.59 cm/s LV PW:         1.21 cm  LV E/e' medial:  17.8 LV IVS:        0.88 cm  LV e' lateral:   12.60 cm/s LVOT diam:     1.50 cm  LV E/e' lateral: 12.1 LV SV:         67 LV SV Index:   42 LVOT Area:     1.77 cm  RIGHT VENTRICLE RV Basal diam:  2.91 cm RV S prime:     22.70 cm/s TAPSE (M-mode): 3.0 cm LEFT ATRIUM             Index       RIGHT ATRIUM           Index LA diam:        4.70 cm 2.91 cm/m  RA Area:  14.60 cm LA Vol (A2C):   75.0 ml 46.47 ml/m RA Volume:   37.10 ml  22.99 ml/m LA Vol (A4C):   89.8 ml 55.64 ml/m LA Biplane Vol: 85.0 ml 52.67 ml/m  AORTIC VALVE AV Area (Vmax):    1.24 cm AV Area (Vmean):   1.26 cm AV Area (VTI):     1.31 cm AV Vmax:           250.00 cm/s AV Vmean:          175.000 cm/s  AV VTI:            0.513 m AV Peak Grad:      25.0 mmHg AV Mean Grad:      14.0 mmHg LVOT Vmax:         176.00 cm/s LVOT Vmean:        125.000 cm/s LVOT VTI:          0.380 m LVOT/AV VTI ratio: 0.74  AORTA Ao Root diam: 2.70 cm Ao Asc diam:  2.70 cm MITRAL VALVE                TRICUSPID VALVE MV Area (PHT): 3.85 cm     TR Peak grad:   26.8 mmHg MV Decel Time: 197 msec     TR Vmax:        259.00 cm/s MV E velocity: 153.00 cm/s MV A velocity: 123.00 cm/s  SHUNTS MV E/A ratio:  1.24         Systemic VTI:  0.38 m                             Systemic Diam: 1.50 cm Cherlynn Kaiser MD Electronically signed by Cherlynn Kaiser MD Signature Date/Time: 07/25/2020/1:56:25 PM    Final    [place] EKG SITE RITE  Result Date: 07/26/2020 If Site Rite image not attached, placement could not be confirmed due to current cardiac rhythm.       Scheduled Meds: . (feeding supplement) PROSource Plus  30 mL Oral BID BM  . acetylcysteine  1 mL Nebulization BID  . Chlorhexidine Gluconate Cloth  6 each Topical Q0600  . feeding supplement  237 mL Oral BID BM  . insulin aspart  0-15 Units Subcutaneous TID WC  . insulin aspart  0-5 Units Subcutaneous QHS  . insulin aspart  3 Units Subcutaneous TID WC  . insulin detemir  10 Units Subcutaneous BID  . ipratropium-albuterol  3 mL Nebulization Q6H  . levothyroxine  50 mcg Oral QAC breakfast  . lipase/protease/amylase  24,000 Units Oral TID AC  . loperamide  4 mg Oral BID  . mouth rinse  15 mL Mouth Rinse BID  . multivitamin with minerals  1 tablet Oral Daily  . rifaximin  550 mg Oral BID   Continuous Infusions: . albuterol    . piperacillin-tazobactam (ZOSYN)  IV 12.5 mL/hr at 07/26/20 0648  . potassium chloride    . vancomycin 750 mg (07/26/20 0922)     LOS: 7 days    Time spent: 35 minutes    Trevor Wilkie D Manuella Ghazi, DO [facility]  If 7PM-7AM, please contact night-coverage [email or web address] 07/26/2020, 11:52 AM

## 2020-07-27 DIAGNOSIS — R4182 Altered mental status, unspecified: Secondary | ICD-10-CM | POA: Diagnosis not present

## 2020-07-27 LAB — COMPREHENSIVE METABOLIC PANEL
ALT: 21 U/L (ref 0–44)
AST: 55 U/L — ABNORMAL HIGH (ref 15–41)
Albumin: 2 g/dL — ABNORMAL LOW (ref 3.5–5.0)
Alkaline Phosphatase: 57 U/L (ref 38–126)
Anion gap: 4 — ABNORMAL LOW (ref 5–15)
BUN: 20 mg/dL (ref 6–20)
CO2: 27 mmol/L (ref 22–32)
Calcium: 7.6 mg/dL — ABNORMAL LOW (ref 8.9–10.3)
Chloride: 107 mmol/L (ref 98–111)
Creatinine, Ser: 0.33 mg/dL — ABNORMAL LOW (ref 0.44–1.00)
GFR, Estimated: >60 mL/min (ref >60)
Glucose, Bld: 86 mg/dL (ref 70–99)
Potassium: 3.5 mmol/L (ref 3.5–5.1)
Sodium: 138 mmol/L (ref 135–145)
Total Bilirubin: 9.4 mg/dL — ABNORMAL HIGH (ref 0.3–1.2)
Total Protein: 4.4 g/dL — ABNORMAL LOW (ref 6.5–8.1)

## 2020-07-27 LAB — CBC
HCT: 23.5 % — ABNORMAL LOW (ref 36.0–46.0)
Hemoglobin: 7.5 g/dL — ABNORMAL LOW (ref 12.0–15.0)
MCH: 34.9 pg — ABNORMAL HIGH (ref 26.0–34.0)
MCHC: 31.9 g/dL (ref 30.0–36.0)
MCV: 109.3 fL — ABNORMAL HIGH (ref 80.0–100.0)
Platelets: 55 10*3/uL — ABNORMAL LOW (ref 150–400)
RBC: 2.15 MIL/uL — ABNORMAL LOW (ref 3.87–5.11)
RDW: 17.8 % — ABNORMAL HIGH (ref 11.5–15.5)
WBC: 5.8 10*3/uL (ref 4.0–10.5)
nRBC: 0.3 % — ABNORMAL HIGH (ref 0.0–0.2)

## 2020-07-27 LAB — MAGNESIUM: Magnesium: 1.9 mg/dL (ref 1.7–2.4)

## 2020-07-27 LAB — GLUCOSE, CAPILLARY
Glucose-Capillary: 71 mg/dL (ref 70–99)
Glucose-Capillary: 152 mg/dL — ABNORMAL HIGH (ref 70–99)
Glucose-Capillary: 153 mg/dL — ABNORMAL HIGH (ref 70–99)
Glucose-Capillary: 162 mg/dL — ABNORMAL HIGH (ref 70–99)

## 2020-07-27 LAB — PROCALCITONIN: Procalcitonin: 0.43 ng/mL

## 2020-07-27 MED ORDER — POTASSIUM CHLORIDE CRYS ER 20 MEQ PO TBCR
40.0000 meq | EXTENDED_RELEASE_TABLET | Freq: Once | ORAL | Status: AC
  Administered 2020-07-27: 40 meq via ORAL
  Filled 2020-07-27: qty 2

## 2020-07-27 NOTE — Progress Notes (Signed)
[name]  BWL:[id number] DOB: 06-13-1965 DOA: 07/19/2020 PCP: [facility]    Chief Complaint  Patient presents with  . Altered Mental Status    Brief Narrative:   Joan Olivier Hernandezis a 55 y.o.femalewith medical history significant ofcirrhosis, insulin-dependent diabetes on insulin pump, chronic pancreatitis, thrombocytopenia (usually in the 60s), and hypothyroidism who was admitted on 5/1 for hepatic encephalopathy. See HPI and H&P for details. On admission, pt continued to have confusion and noted new-onset neck pain.She was admitted with sepsis secondary to E. coli UTI without findings of bacteria, but now appears to have worsening mentation with potential sepsis related to multifocal pneumonia. Her antibiotic coverage has been broadened to vancomycin and Zosyn with improvement noted.  Oxygen requirements are diminishing, and she is no longer having diarrhea as well with negative GI work-up. Per ST on 5/8 she can continue her normal diet dys 3 and passed her swallow exam. Continue to wean O2 requirements and switch from iv Abx to PO Abx prior to DC, which will likely be tomorrow 5/10. SW and TOC team aware and are planning for discharge.   Assessment & Plan:   Active Problems:   Thrombocytopenia ([place])   Alcoholic cirrhosis of liver without ascites (HCC)   Essential hypertension   Acquired hypothyroidism   Type 2 diabetes mellitus ([place])   Coagulopathy (HCC)   Altered mental status   Sepsis ([place])   Acute lower UTI   DNR (do not resuscitate)   Pancreatic insufficiency   Malnutrition of moderate degree   Acute metabolic encephalopathy-multifactorial -Appears to be related tosepsis with multifocal pneumonia, initially with E. coli UTI -Possibly related to hypoxemia she continues to pull oxygen off -Expand coverage to vancomycin and Zosyn5/7, Augmentin discontinued.   - Will need to switch to PO Abx for HCAP at discharge, likely  Levofloxacin 12d -Follow-up chest x-rayandABG -Xanax as needed for anxiety episodes  Acute on chronic hypoxemic respiratory failure with multifocal pneumonia-likely HCAP -Improving, continue to wean O2. Currently on 7 L [place], compared to 2-4 L at home. -CXR with diffuse multifocal PNA (full impression below) -Vancomycin and Zosyn initiated 5/7, will need to switch to PO Abx at discharge       -EKG order placed to eval QTc in case Levo is chosen as outpt Abx. -Given concern for HCAP vs Aspiration PNA, SLP consulted, appreciate recs on 5/8:       -Passed swallow eval       -Switch to Dysphagia 3 diet given improved mentation -MRSA PCR positive -DuoNeb scheduled every 6 hours -Mucomyst -Incentive spirometry/flutter valve -2D echo ordered 5/7 with no acute findings; LVEF 65-70% -No leukocytosis since 5/3. Will recheck CBC tomorrow AM 5/10 before d/c.  Persistent hypokalemia with noted diarrhea -Lactulose currently held -Stool C. difficile negative -GI panel negative -Pt no longer reporting loose stools -K currently 3.5 compared to 2.4 yesterday 5/8 -Will recheck BMP tomorrow AM 5/10 before discharge  Thrombocytopenia/elevated INR secondary to alcoholic liver cirrhosis-stable -Continue to monitor -Holding lactulose secondary to above -Will recheck PTT/INR tomorrow AM 5/10 before discharge  Anemia-stable -Multifactorial, given Fe studies, Ovalocytes, and Macrocytosis despite nl B12 and Folate levels -Monitor repeat CBC  Type2 diabetes -Holding Levemir until appetite improves.  -Advanced to soft 5/8 -Pt near-normal appetite as of 5/9. -Continue SSI  Hypothyroidism -Continue levothyroxine  Pancreatic insufficiency in the setting of chronic pancreatitis -Continue home Cre[place]uring meals   DVT prophylaxis: SCDs  Code Status: DNR (confirmed on presentation) Family  Communication: Discussed with husband at bedside 5/9  Disposition:   Status is: Inpatient  Remains  inpatient appropriate because:Inpatient level of care appropriate due to severity of illness and planning to discharge tomorrow 5/10   Dispo: The patient is from: Home              Anticipated d/c is to: Home              Patient currently is medically stable to d/c.   Difficult to place patient No       Consultants:   None today  Procedures:   See below  Antimicrobials:   Currently on Vanc/Zosyn since 5/7, will switch to PO abx on discharge, will clarify precise medication, dosage, frequency, and duration at that time.    Subjective: Patient seen today 5/9 sitting comfortably at bedside with husband. Pt alert, conversant, with a good mood and positive affect. Does not report diarrhea, nausea, pains, headaches, or confusion. She had no concerns overnight. States she feels she's ready to go home.  Objective: Vitals:   07/26/20 2354 07/27/20 0537 07/27/20 0737 07/27/20 0758  BP:  (!) 122/57 118/62   Pulse:  84 71   Resp:  17 18   Temp:  98.5 F (36.9 C) 98.9 F (37.2 C)   TempSrc:   Oral   SpO2: 94% 94% 90% 91%  Weight:  64.7 kg    Height:        Intake/Output Summary (Last 24 hours) at 07/27/2020 1103 Last data filed at 07/27/2020 0829 Gross per 24 hour  Intake 1234.1 ml  Output 1 ml  Net 1233.1 ml   Filed Weights   07/25/20 0500 07/26/20 0602 07/27/20 0537  Weight: 60.9 kg 61.2 kg 64.7 kg    Examination:  General exam: Appears calm and comfortable, sitting on bedside and fully awake with appropriate speech content and fluency despite baseline dysarthria. Respiratory system: Clear to auscultation. Respiratory effort normal. Currently on 7L [place] in room. Cardiovascular system: S1 & S2 heard, RRR. No JVD, no significant lower extremity edema. Gastrointestinal system: Abdomen is soft and nontender.  Central nervous system: Alert and oriented, awake and alert. No focal neurological deficits. Extremities: Grossly symmetric strength, moving all limbs spontaneously.  Baseline R>L sensory exam to light touch. No edema. Skin: No lesions or ulcers noted. Psychiatry: Judgement and insight appear normal. Mood positive and forward-thinking.    Data Reviewed: I have personally reviewed the following labs and imaging studies.  CBC: Recent Labs  Lab 07/22/20 0604 07/24/20 0635 07/25/20 0705 07/26/20 0618 07/27/20 0513  WBC 4.0 6.0 7.3 7.1 5.8  HGB 9.0* 8.9* 9.4* 7.9* 7.5*  HCT 28.3* 28.2* 29.4* 24.6* 23.5*  MCV 108.8* 107.6* 107.3* 107.4* 109.3*  PLT 36* 49* 57* 61* 55*    Basic Metabolic Panel: Recent Labs  Lab 07/23/20 0634 07/24/20 0635 07/25/20 0705 07/26/20 0618 07/27/20 0513  NA 139 142 145 139 138  K 3.7 2.8* 2.9* 2.4* 3.5  CL 107 106 109 105 107  CO2 28 28 29 30 27   GLUCOSE 189* 171* 78 97 86  BUN 32* 29* 28* 24* 20  CREATININE 0.38* 0.34* <0.30* 0.35* 0.33*  CALCIUM 9.0 9.1 8.8* 7.7* 7.6*  MG 1.9 1.6* 1.9 1.8 1.9    GFR: Estimated Creatinine Clearance: 68.5 mL/min (A) (by C-G formula based on SCr of 0.33 mg/dL (L)).  Liver Function Tests: Recent Labs  Lab 07/22/20 0604 07/23/20 3976 07/24/20 0635 07/26/20 0618 07/27/20 0513  AST 46* 38  53* 59* 55*  ALT 16 17 19 18 21   ALKPHOS 74 72 77 62 57  BILITOT 6.9* 5.2* 7.8* 10.6* 9.4*  PROT 5.6* 5.0* 5.4* 4.5* 4.4*  ALBUMIN 2.8* 2.4* 2.6* 2.2* 2.0*    CBG: Recent Labs  Lab 07/26/20 1139 07/26/20 1603 07/26/20 1604 07/26/20 2147 07/27/20 0736  GLUCAP 222* 144* 161* 146* 71     Recent Results (from the past 240 hour(s))  Resp Panel by RT-PCR (Flu A&B, Covid) Nasopharyngeal Swab     Status: None   Collection Time: 07/19/20  9:59 AM   Specimen: Nasopharyngeal Swab; Nasopharyngeal(NP) swabs in vial transport medium  Result Value Ref Range Status   SARS Coronavirus 2 by RT PCR NEGATIVE NEGATIVE Final    Comment: (NOTE) SARS-CoV-2 target nucleic acids are NOT DETECTED.  The SARS-CoV-2 RNA is generally detectable in upper respiratory specimens during the acute  phase of infection. The lowest concentration of SARS-CoV-2 viral copies this assay can detect is 138 copies/mL. A negative result does not preclude SARS-Cov-2 infection and should not be used as the sole basis for treatment or other patient management decisions. A negative result may occur with  improper specimen collection/handling, submission of specimen other than nasopharyngeal swab, presence of viral mutation(s) within the areas targeted by this assay, and inadequate number of viral copies(<138 copies/mL). A negative result must be combined with clinical observations, patient history, and epidemiological information. The expected result is Negative.  Fact Sheet for Patients:  EntrepreneurPulse.com.au  Fact Sheet for Healthcare Providers:  IncredibleEmployment.be  This test is no t yet approved or cleared by the [place] FDA and  has been authorized for detection and/or diagnosis of SARS-CoV-2 by FDA under an Emergency Use Authorization (EUA). This EUA will remain  in effect (meaning this test can be used) for the duration of the COVID-19 declaration under Section 564(b)(1) of the Act, 21 U.S.C.section 360bbb-3(b)(1), unless the authorization is terminated  or revoked sooner.       Influenza A by PCR NEGATIVE NEGATIVE Final   Influenza B by PCR NEGATIVE NEGATIVE Final    Comment: (NOTE) The Xpert Xpress SARS-CoV-2/FLU/RSV plus assay is intended as an aid in the diagnosis of influenza from Nasopharyngeal swab specimens and should not be used as a sole basis for treatment. Nasal washings and aspirates are unacceptable for Xpert Xpress SARS-CoV-2/FLU/RSV testing.  Fact Sheet for Patients: EntrepreneurPulse.com.au  Fact Sheet for Healthcare Providers: IncredibleEmployment.be  This test is not yet approved or cleared by the [place] FDA and has been authorized for detection and/or diagnosis of  SARS-CoV-2 by FDA under an Emergency Use Authorization (EUA). This EUA will remain in effect (meaning this test can be used) for the duration of the COVID-19 declaration under Section 564(b)(1) of the Act, 21 U.S.C. section 360bbb-3(b)(1), unless the authorization is terminated or revoked.  Performed at [facility], [address] [place], [place] [address]   Blood Culture (routine x 2)     Status: None   Collection Time: 07/19/20 10:10 AM   Specimen: BLOOD RIGHT FOREARM  Result Value Ref Range Status   Specimen Description   Final    BLOOD RIGHT FOREARM BOTTLES DRAWN AEROBIC AND ANAEROBIC   Special Requests Blood Culture adequate volume  Final   Culture   Final    NO GROWTH 5 DAYS Performed at [facility], [address] [place], [place] [address]    Report Status 07/24/2020 FINAL  Final  Blood Culture (routine x 2)  Status: None   Collection Time: 07/19/20 10:10 AM   Specimen: Left Antecubital; Blood  Result Value Ref Range Status   Specimen Description   Final    LEFT ANTECUBITAL BOTTLES DRAWN AEROBIC AND ANAEROBIC   Special Requests   Final    Blood Culture results may not be optimal due to an excessive volume of blood received in culture bottles   Culture   Final    NO GROWTH 5 DAYS Performed at [facility], [address] [place], [place] [address]    Report Status 07/24/2020 FINAL  Final  Urine culture     Status: Abnormal   Collection Time: 07/19/20 11:30 AM   Specimen: In/Out Cath Urine  Result Value Ref Range Status   Specimen Description   Final    IN/OUT CATH URINE Performed at [facility], [address] [place], [place] [address]    Special Requests   Final    NONE Performed at [facility], [address], [place], [place] [address]    Culture >=100,000 COLONIES/mL ESCHERICHIA COLI (A)  Final   Report Status 07/22/2020 FINAL  Final   Organism ID, Bacteria ESCHERICHIA COLI (A)  Final      Susceptibility   Escherichia coli - MIC*    AMPICILLIN <=2  SENSITIVE Sensitive     CEFAZOLIN <=4 SENSITIVE Sensitive     CEFEPIME <=0.12 SENSITIVE Sensitive     CEFTRIAXONE <=0.25 SENSITIVE Sensitive     CIPROFLOXACIN <=0.25 SENSITIVE Sensitive     GENTAMICIN <=1 SENSITIVE Sensitive     IMIPENEM <=0.25 SENSITIVE Sensitive     NITROFURANTOIN <=16 SENSITIVE Sensitive     TRIMETH/SULFA <=20 SENSITIVE Sensitive     AMPICILLIN/SULBACTAM <=2 SENSITIVE Sensitive     PIP/TAZO <=4 SENSITIVE Sensitive     * >=100,000 COLONIES/mL ESCHERICHIA COLI  MRSA PCR Screening     Status: Abnormal   Collection Time: 07/19/20  5:18 PM   Specimen: Nasopharyngeal  Result Value Ref Range Status   MRSA by PCR POSITIVE (A) NEGATIVE Final    Comment:        The GeneXpert MRSA Assay (FDA approved for NASAL specimens only), is one component of a comprehensive MRSA colonization surveillance program. It is not intended to diagnose MRSA infection nor to guide or monitor treatment for MRSA infections. RESULT CALLED TO, READ BACK BY AND VERIFIED WITH: LEDWELL, L@1145  by [email or web address],b 5.2.22 Performed at [facility], [address], [place], [place] [address]   Gastrointestinal Panel by PCR , Stool     Status: None   Collection Time: 07/24/20  4:51 PM   Specimen: Stool  Result Value Ref Range Status   Campylobacter species NOT DETECTED NOT DETECTED Final   Plesimonas shigelloides NOT DETECTED NOT DETECTED Final   Salmonella species NOT DETECTED NOT DETECTED Final   Yersinia enterocolitica NOT DETECTED NOT DETECTED Final   Vibrio species NOT DETECTED NOT DETECTED Final   Vibrio cholerae NOT DETECTED NOT DETECTED Final   Enteroaggregative E coli (EAEC) NOT DETECTED NOT DETECTED Final   Enteropathogenic E coli (EPEC) NOT DETECTED NOT DETECTED Final   Enterotoxigenic E coli (ETEC) NOT DETECTED NOT DETECTED Final   Shiga like toxin producing E coli (STEC) NOT DETECTED NOT DETECTED Final   Shigella/Enteroinvasive E coli (EIEC) NOT DETECTED NOT DETECTED Final    Cryptosporidium NOT DETECTED NOT DETECTED Final   Cyclospora cayetanensis NOT DETECTED NOT DETECTED Final   Entamoeba histolytica NOT DETECTED NOT DETECTED Final   Giardia lamblia NOT DETECTED NOT  DETECTED Final   Adenovirus F40/41 NOT DETECTED NOT DETECTED Final   Astrovirus NOT DETECTED NOT DETECTED Final   Norovirus GI/GII NOT DETECTED NOT DETECTED Final   Rotavirus A NOT DETECTED NOT DETECTED Final   Sapovirus (I, II, IV, and V) NOT DETECTED NOT DETECTED Final    Comment: Performed at [facility], [place], [place] [address]  C Difficile Quick Screen (NO PCR Reflex)     Status: None   Collection Time: 07/24/20  4:51 PM   Specimen: Stool  Result Value Ref Range Status   C Diff antigen NEGATIVE NEGATIVE Final   C Diff toxin NEGATIVE NEGATIVE Final   C Diff interpretation No C. difficile detected.  Final    Comment: Performed at [facility], [address] [place], [place] [address]         Radiology Studies: ECHOCARDIOGRAM COMPLETE  Result Date: 07/25/2020    ECHOCARDIOGRAM REPORT   Patient Name:   SHERISE GEERDES Date of Exam: 07/25/2020 Medical Rec #:  [id number]         Height:       62.0 in Accession #:    [id number]        Weight:       134.3 lb Date of Birth:  12/26/1965         BSA:          1.614 m Patient Age:    50 years          BP:           138/65 mmHg Patient Gender: F                 HR:           100 bpm. Exam Location:  [facility] Procedure: 2D Echo, Cardiac Doppler and Color Doppler Indications:    Acute Respiratory Distress R06.03  History:        Patient has no prior history of Echocardiogram examinations.                 Risk Factors:Diabetes, Sleep Apnea and GERD.  Sonographer:    Jonelle Sidle Dance Referring Phys: [id number] [place]  1. Left ventricular ejection fraction, by estimation, is 65 to 70%. The left ventricle has normal function. The left ventricle has no regional wall motion abnormalities. There is mild left ventricular  hypertrophy. Indeterminate diastolic filling due to E-A fusion.  2. Right ventricular systolic function is normal. The right ventricular size is normal. There is normal pulmonary artery systolic pressure. The estimated right ventricular systolic pressure is 84.1 mmHg.  3. Left atrial size was severely dilated.  4. The mitral valve is normal in structure. No evidence of mitral valve regurgitation. No evidence of mitral stenosis.  5. The aortic valve is grossly normal. There is mild calcification of the aortic valve. Aortic valve regurgitation is not visualized. Mild to moderate aortic valve sclerosis/calcification is present, with borderline findings of aortic stenosis, if present it is mild.  6. Increased flow velocities may be secondary to anemia, thyrotoxicosis, hyperdynamic or high flow state.  7. The inferior vena cava is dilated in size with >50% respiratory variability, suggesting right atrial pressure of 8 mmHg. FINDINGS  Left Ventricle: Left ventricular ejection fraction, by estimation, is 65 to 70%. The left ventricle has normal function. The left ventricle has no regional wall motion abnormalities. The left ventricular internal cavity size was normal in size. There is  mild left ventricular  hypertrophy. Indeterminate diastolic filling due to E-A fusion. Right Ventricle: The right ventricular size is normal. No increase in right ventricular wall thickness. Right ventricular systolic function is normal. There is normal pulmonary artery systolic pressure. The tricuspid regurgitant velocity is 2.59 m/s, and  with an assumed right atrial pressure of 8 mmHg, the estimated right ventricular systolic pressure is 62.3 mmHg. Left Atrium: Left atrial size was severely dilated. Right Atrium: Right atrial size was normal in size. Pericardium: There is no evidence of pericardial effusion. Mitral Valve: The mitral valve is normal in structure. Mild mitral annular calcification. No evidence of mitral valve regurgitation.  No evidence of mitral valve stenosis. Tricuspid Valve: The tricuspid valve is normal in structure. Tricuspid valve regurgitation is trivial. No evidence of tricuspid stenosis. Aortic Valve: The aortic valve is grossly normal. There is mild calcification of the aortic valve. Aortic valve regurgitation is not visualized. Mild to moderate aortic valve sclerosis/calcification is present, without any evidence of aortic stenosis. Aortic valve mean gradient measures 14.0 mmHg. Aortic valve peak gradient measures 25.0 mmHg. Aortic valve area, by VTI measures 1.31 cm. Pulmonic Valve: The pulmonic valve was normal in structure. Pulmonic valve regurgitation is trivial. No evidence of pulmonic stenosis. Aorta: The aortic root is normal in size and structure. Venous: The inferior vena cava is dilated in size with greater than 50% respiratory variability, suggesting right atrial pressure of 8 mmHg. IAS/Shunts: The interatrial septum was not well visualized.  LEFT VENTRICLE PLAX 2D LVIDd:         5.01 cm  Diastology LVIDs:         3.27 cm  LV e' medial:    8.59 cm/s LV PW:         1.21 cm  LV E/e' medial:  17.8 LV IVS:        0.88 cm  LV e' lateral:   12.60 cm/s LVOT diam:     1.50 cm  LV E/e' lateral: 12.1 LV SV:         67 LV SV Index:   42 LVOT Area:     1.77 cm  RIGHT VENTRICLE RV Basal diam:  2.91 cm RV S prime:     22.70 cm/s TAPSE (M-mode): 3.0 cm LEFT ATRIUM             Index       RIGHT ATRIUM           Index LA diam:        4.70 cm 2.91 cm/m  RA Area:     14.60 cm LA Vol (A2C):   75.0 ml 46.47 ml/m RA Volume:   37.10 ml  22.99 ml/m LA Vol (A4C):   89.8 ml 55.64 ml/m LA Biplane Vol: 85.0 ml 52.67 ml/m  AORTIC VALVE AV Area (Vmax):    1.24 cm AV Area (Vmean):   1.26 cm AV Area (VTI):     1.31 cm AV Vmax:           250.00 cm/s AV Vmean:          175.000 cm/s AV VTI:            0.513 m AV Peak Grad:      25.0 mmHg AV Mean Grad:      14.0 mmHg LVOT Vmax:         176.00 cm/s LVOT Vmean:        125.000 cm/s LVOT VTI:           0.380  m LVOT/AV VTI ratio: 0.74  AORTA Ao Root diam: 2.70 cm Ao Asc diam:  2.70 cm MITRAL VALVE                TRICUSPID VALVE MV Area (PHT): 3.85 cm     TR Peak grad:   26.8 mmHg MV Decel Time: 197 msec     TR Vmax:        259.00 cm/s MV E velocity: 153.00 cm/s MV A velocity: 123.00 cm/s  SHUNTS MV E/A ratio:  1.24         Systemic VTI:  0.38 m                             Systemic Diam: 1.50 cm Cherlynn Kaiser MD Electronically signed by Cherlynn Kaiser MD Signature Date/Time: 07/25/2020/1:56:25 PM    Final    [place] EKG SITE RITE  Result Date: 07/26/2020 If Site Rite image not attached, placement could not be confirmed due to current cardiac rhythm.       Scheduled Meds: . (feeding supplement) PROSource Plus  30 mL Oral BID BM  . acetylcysteine  1 mL Nebulization BID  . Chlorhexidine Gluconate Cloth  6 each Topical Q0600  . feeding supplement  237 mL Oral BID BM  . insulin aspart  0-15 Units Subcutaneous TID WC  . insulin aspart  0-5 Units Subcutaneous QHS  . insulin aspart  3 Units Subcutaneous TID WC  . insulin detemir  10 Units Subcutaneous BID  . ipratropium-albuterol  3 mL Nebulization TID  . levothyroxine  50 mcg Oral QAC breakfast  . lipase/protease/amylase  24,000 Units Oral TID AC  . loperamide  4 mg Oral BID  . mouth rinse  15 mL Mouth Rinse BID  . multivitamin with minerals  1 tablet Oral Daily  . rifaximin  550 mg Oral BID   Continuous Infusions: . piperacillin-tazobactam (ZOSYN)  IV 3.375 g (07/27/20 0542)  . vancomycin 750 mg (07/27/20 0027)     LOS: 8 days    Time spent: [place], MS4 [facility]   To contact the attending provider between 7A-7P or the covering provider during after hours 7P-7A, please log into the web site [email or web address] and access using universal [place] password for that web site. If you do not have the password, please call the hospital operator.  07/27/2020, 11:03 AM

## 2020-07-27 NOTE — Evaluation (Signed)
Physical Therapy Evaluation Patient Details Name: [name] MRN: [id number] DOB: 06/16/1965 Today's Date: 07/27/2020   History of Present Illness  [name] is a 55 y.o. female with medical history significant of cirrhosis, insulin-dependent diabetes on insulin pump, chronic pancreatitis, thrombocytopenia, hypothyroidism who was admitted on admissions for hepatic encephalopathy.  She was brought to the hospital today with altered mental status.  According to records, patient was in her usual state of health last night.  Her patient had gone to be rechecked and she was noted to be lethargic/confused.  Patient did not have any recollection of these events.  She says that she has felt generally unwell for the last week.  She has noticed her urine has become more dark and more foul-smelling.  She does not have any dysuria.  She has chronic shortness of breath but does not really have a cough or sore throat.  She has nausea which is also a chronic issue.  She does have loose stools, but attributes this to her lactulose use which she has been using.  She denies any sores on her skin.  She does complain of neck pain that began this morning.  She denies having it yesterday.  She feels she may have slept in an uncomfortable way.  Denies any changes in her vision.  She has chronic abdominal which she feels is relatively changed    Clinical Impression  Patient functioning near baseline for functional mobility and gait demonstrating slow labored cadence without loss of balance, on 4 LPM with SpO2 between 88-92% while walking, once resting at bedside, SpO2 increased to 94% and left on 4 LPM - RN notified.  Patient will benefit from continued physical therapy in hospital and recommended venue below to increase strength, balance, endurance for safe ADLs and gait.     Follow Up Recommendations Home health PT;Supervision for mobility/OOB;Supervision - Intermittent    Equipment Recommendations  Other  (comment) (Rollator walker with seat and brakes)    Recommendations for Other Services       Precautions / Restrictions Precautions Precautions: Fall Restrictions Weight Bearing Restrictions: No      Mobility  Bed Mobility Overal bed mobility: Modified Independent             General bed mobility comments: HOB raised    Transfers Overall transfer level: Needs assistance Equipment used: Rolling walker (2 wheeled) Transfers: Sit to/from [facility] Sit to Stand: Min guard;Min assist Stand pivot transfers: Min guard;Min assist       General transfer comment: increased time, labored movement, had to use RW for safety  Ambulation/Gait Ambulation/Gait assistance: Min guard;Min assist Gait Distance (Feet): 45 Feet Assistive device: Rolling walker (2 wheeled) Gait Pattern/deviations: Decreased step length - right;Decreased step length - left;Decreased stride length Gait velocity: decreased   General Gait Details: slow labored cadence without loss of balance, on 4 LPM with SpO2 at 88-93%  Stairs            Wheelchair Mobility    Modified Rankin (Stroke Patients Only)       Balance Overall balance assessment: Needs assistance Sitting-balance support: Feet supported;No upper extremity supported Sitting balance-Leahy Scale: Good Sitting balance - Comments: seated at EOB   Standing balance support: During functional activity;No upper extremity supported Standing balance-Leahy Scale: Poor Standing balance comment: fair using RW  Pertinent Vitals/Pain Pain Assessment: No/denies pain    Home Living Family/patient expects to be discharged to:: Private residence Living Arrangements: Spouse/significant other Available Help at Discharge: Family;Available 24 hours/day Type of Home: House Home Access: Ramped entrance     Home Layout: One level Home Equipment: Walker - 2 wheels;Walker - 4 wheels;Cane -  single point;Hospital bed;Bedside commode;Wheelchair - manual Additional Comments: has borrowed Teaching laboratory technician    Prior Function Level of Independence: Needs assistance   Gait / Transfers Assistance Needed: household ambulator using RW  ADL's / Homemaking Assistance Needed: assisted by family        Hand Dominance        Extremity/Trunk Assessment   Upper Extremity Assessment Upper Extremity Assessment: Generalized weakness    Lower Extremity Assessment Lower Extremity Assessment: Generalized weakness    Cervical / Trunk Assessment Cervical / Trunk Assessment: Normal  Communication   Communication: No difficulties  Cognition Arousal/Alertness: Awake/alert Behavior During Therapy: WFL for tasks assessed/performed Overall Cognitive Status: Within Functional Limits for tasks assessed                                        General Comments      Exercises     Assessment/Plan    PT Assessment Patient needs continued PT services  PT Problem List Decreased strength;Decreased activity tolerance;Decreased balance;Decreased mobility       PT Treatment Interventions DME instruction;Gait training;Stair training;Functional mobility training;Therapeutic activities;Therapeutic exercise;Balance training;Patient/family education    PT Goals (Current goals can be found in the Care Plan section)  Acute Rehab PT Goals Patient Stated Goal: return home with family to assist PT Goal Formulation: With patient/family Time For Goal Achievement: 07/30/20 Potential to Achieve Goals: Good    Frequency Min 3X/week   Barriers to discharge        Co-evaluation               AM-PAC PT "6 Clicks" Mobility  Outcome Measure Help needed turning from your back to your side while in a flat bed without using bedrails?: None Help needed moving from lying on your back to sitting on the side of a flat bed without using bedrails?: A Little Help needed moving to and  from a bed to a chair (including a wheelchair)?: A Little Help needed standing up from a chair using your arms (e.g., wheelchair or bedside chair)?: A Little Help needed to walk in hospital room?: A Little Help needed climbing 3-5 steps with a railing? : A Lot 6 Click Score: 18    End of Session Equipment Utilized During Treatment: Oxygen Activity Tolerance: Patient tolerated treatment well;Patient limited by fatigue Patient left: in bed;with call bell/phone within reach;with family/visitor present Nurse Communication: Mobility status PT Visit Diagnosis: Unsteadiness on feet (R26.81);Other abnormalities of gait and mobility (R26.89);Muscle weakness (generalized) (M62.81)    Time: 9509-3267 PT Time Calculation (min) (ACUTE ONLY): 30 min   Charges:   PT Evaluation $PT Eval Moderate Complexity: 1 Mod PT Treatments $Therapeutic Activity: 23-37 mins        12:31 PM, 07/27/20 Lonell Grandchild, MPT Physical Therapist with [facility] [phone] office [phone] mobile phone

## 2020-07-27 NOTE — TOC Progression Note (Signed)
Transition of Care [facility]) - Progression Note    Patient Details  Name: [name] MRN: [id number] Date of Birth: January 15, 1966  [facility] CM/SW Contact  Shade Flood, LCSW Phone Number: 07/27/2020, 10:38 AM  Clinical Narrative:     TOC following. Per MD, pt will likely be stable for dc home tomorrow. PT recommending HH PT and a rollator. Spoke with pt to discuss dc planning. Pt now reports that she would like [facility] PT rather than outpatient. Discussed providers and referred as requested.  Arranged hospital follow up appointment with pt's PCP and added to AVS.  Will follow up in AM to further assist with dc planning.  Expected Discharge Plan: [place] Barriers to Discharge: Continued Medical Work up  Expected Discharge Plan and Services Expected Discharge Plan: [place] In-house Referral: Clinical Social Work   Post Acute Care Choice: Durable Medical [place] arrangements for the past 2 months: [place]                   DME Agency: [employer] Date DME Agency Contacted: 07/27/20     HH Arranged: PT Cut Off Agency: [place] Date [place] Agency Contacted: 07/27/20   Representative spoke with at [place]: [place] ([place]) Interventions    Readmission Risk Interventions Readmission Risk Prevention Plan 07/27/2020 07/20/2020  Transportation Screening - Complete  PCP or Specialist Appt within 3-5 Days Complete -  [place]RI or [place] - Complete  Social Work Consult for [place] Planning/Counseling - Complete  Palliative Care Screening - Not Applicable  Medication Review Press photographer) - Complete  Some recent data might be hidden

## 2020-07-27 NOTE — Plan of Care (Signed)
  Problem: Acute Rehab PT Goals(only PT should resolve) Goal: Pt Will Go Supine/Side To Sit Outcome: Progressing Flowsheets (Taken 07/27/2020 1233) Pt will go Supine/Side to Sit:  Independently  with modified independence Goal: Patient Will Transfer Sit To/From Stand Outcome: Progressing Flowsheets (Taken 07/27/2020 1233) Patient will transfer sit to/from stand: with supervision Goal: Pt Will Transfer Bed To Chair/Chair To Bed Outcome: Progressing Flowsheets (Taken 07/27/2020 1233) Pt will Transfer Bed to Chair/Chair to Bed: with supervision Goal: Pt Will Ambulate Outcome: Progressing Flowsheets (Taken 07/27/2020 1233) Pt will Ambulate:  75 feet  with supervision  with rolling walker   12:33 PM, 07/27/20 Lonell Grandchild, MPT Physical Therapist with [facility] [phone] office [phone] mobile phone

## 2020-07-27 NOTE — Plan of Care (Signed)
  Problem: Education: Goal: Knowledge of General Education information will improve Description: Including pain rating scale, medication(s)/side effects and non-pharmacologic comfort measures Outcome: Progressing   Problem: Health Behavior/Discharge Planning: Goal: Ability to manage health-related needs will improve Outcome: Progressing   Problem: Coping: Goal: Level of anxiety will decrease Outcome: Progressing   Problem: Elimination: Goal: Will not experience complications related to bowel motility Outcome: Progressing Goal: Will not experience complications related to urinary retention Outcome: Progressing   Problem: Pain Managment: Goal: General experience of comfort will improve Outcome: Progressing   Problem: Safety: Goal: Ability to remain free from injury will improve Outcome: Progressing   Problem: Skin Integrity: Goal: Risk for impaired skin integrity will decrease Outcome: Progressing

## 2020-07-28 ENCOUNTER — Inpatient Hospital Stay (HOSPITAL_COMMUNITY): Payer: Medicare HMO

## 2020-07-28 DIAGNOSIS — J9621 Acute and chronic respiratory failure with hypoxia: Secondary | ICD-10-CM

## 2020-07-28 DIAGNOSIS — G9340 Encephalopathy, unspecified: Secondary | ICD-10-CM

## 2020-07-28 DIAGNOSIS — R4182 Altered mental status, unspecified: Secondary | ICD-10-CM | POA: Diagnosis not present

## 2020-07-28 DIAGNOSIS — L8990 Pressure ulcer of unspecified site, unspecified stage: Secondary | ICD-10-CM | POA: Insufficient documentation

## 2020-07-28 LAB — BASIC METABOLIC PANEL
Anion gap: 5 (ref 5–15)
BUN: 13 mg/dL (ref 6–20)
CO2: 26 mmol/L (ref 22–32)
Calcium: 7.9 mg/dL — ABNORMAL LOW (ref 8.9–10.3)
Chloride: 106 mmol/L (ref 98–111)
Creatinine, Ser: <0.3 mg/dL — ABNORMAL LOW (ref 0.44–1.00)
Glucose, Bld: 124 mg/dL — ABNORMAL HIGH (ref 70–99)
Potassium: 3.7 mmol/L (ref 3.5–5.1)
Sodium: 137 mmol/L (ref 135–145)

## 2020-07-28 LAB — CBC
HCT: 26.8 % — ABNORMAL LOW (ref 36.0–46.0)
Hemoglobin: 8.4 g/dL — ABNORMAL LOW (ref 12.0–15.0)
MCH: 34.6 pg — ABNORMAL HIGH (ref 26.0–34.0)
MCHC: 31.3 g/dL (ref 30.0–36.0)
MCV: 110.3 fL — ABNORMAL HIGH (ref 80.0–100.0)
Platelets: 59 10*3/uL — ABNORMAL LOW (ref 150–400)
RBC: 2.43 MIL/uL — ABNORMAL LOW (ref 3.87–5.11)
RDW: 18.5 % — ABNORMAL HIGH (ref 11.5–15.5)
WBC: 8.9 10*3/uL (ref 4.0–10.5)
nRBC: 0 % (ref 0.0–0.2)

## 2020-07-28 LAB — GLUCOSE, CAPILLARY
Glucose-Capillary: 111 mg/dL — ABNORMAL HIGH (ref 70–99)
Glucose-Capillary: 198 mg/dL — ABNORMAL HIGH (ref 70–99)
Glucose-Capillary: 235 mg/dL — ABNORMAL HIGH (ref 70–99)
Glucose-Capillary: 106 mg/dL — ABNORMAL HIGH (ref 70–99)
Glucose-Capillary: 48 mg/dL — ABNORMAL LOW (ref 70–99)
Glucose-Capillary: 160 mg/dL — ABNORMAL HIGH (ref 70–99)
Glucose-Capillary: 115 mg/dL — ABNORMAL HIGH (ref 70–99)

## 2020-07-28 LAB — BRAIN NATRIURETIC PEPTIDE: B Natriuretic Peptide: 185 pg/mL — ABNORMAL HIGH (ref 0.0–100.0)

## 2020-07-28 LAB — PROTIME-INR
INR: 2 — ABNORMAL HIGH (ref 0.8–1.2)
Prothrombin Time: 22.8 seconds — ABNORMAL HIGH (ref 11.4–15.2)

## 2020-07-28 LAB — MAGNESIUM: Magnesium: 1.7 mg/dL (ref 1.7–2.4)

## 2020-07-28 MED ORDER — DEXTROSE 50 % IV SOLN
INTRAVENOUS | Status: AC
  Administered 2020-07-28: 50 mL
  Filled 2020-07-28: qty 50

## 2020-07-28 MED ORDER — FUROSEMIDE 10 MG/ML IJ SOLN
40.0000 mg | Freq: Two times a day (BID) | INTRAMUSCULAR | Status: DC
  Administered 2020-07-28 – 2020-07-29 (×3): 40 mg via INTRAVENOUS
  Filled 2020-07-28 (×3): qty 4

## 2020-07-28 MED ORDER — VANCOMYCIN HCL 1250 MG/250ML IV SOLN
1250.0000 mg | INTRAVENOUS | Status: DC
  Administered 2020-07-28 – 2020-07-29 (×2): 1250 mg via INTRAVENOUS
  Filled 2020-07-28 (×2): qty 250

## 2020-07-28 NOTE — Consult Note (Addendum)
NAME:  [name], MRN:  [id number], DOB:  1965-05-07, LOS: 9 ADMISSION DATE:  07/19/2020, CONSULTATION DATE:  07/28/2020  REFERRING MD:  Lanice Shirts, CHIEF COMPLAINT: Hypoxia, increased oxygen requirements  History of Present Illness:  [name] is a 55 year old with nonalcoholic cirrhosis and multiple prior episodes of hepatic encephalopathy was admitted on 5/1 for altered mental status and fever.  CT imaging of head, chest abdomen and pelvis did not reveal any source of infection except for some gas bubbles noted in the pelvic floor.  Ammonia was normal, she was given broad-spectrum antibiotics-aztreonam, Flagyl and vancomycin, ceftriaxone and lactulose was continued. She was found to have E. coli UTI, pansensitive On 5/3 she developed bilateral infiltrates and increased hypoxia requiring up to 8 L oxygen , she was diuresed for pulmonary edema , Xanax was reintroduced for anxiety and panic attacks .  Her mental status continued to fluctuate over the next few days, gabapentin and Xanax were initially discontinued but then due to increased agitation and concern for benzodiazepine withdrawal, this was resumed .  Antibiotics which had been narrowed down to Augmentin was broadened again to Zosyn and vancomycin on 5/7. On 5/9 she was noted to require 6 L oxygen and was able to ambulate with PT On 5/10 she had increased oxygen requirements and was transferred to stepdown and PCCM consulted  Pertinent  Medical History  Cirrhosis, NASH , not a transplant candidate , history of repeated admissions for hepatic encephalopathy Chronic pancreatitis on replacement therapy IDDM on insulin pump Thrombocytopenia Depression,?  Schizoaffective disorder Anxiety on Xanax Chronic pain on oxycodone  Significant Hospital Events: Including procedures, antibiotic start and stop dates in addition to other pertinent events   . Echo 07/25/2020 normal LVEF, RVSP 35, mild AS  Interim History / Subjective:    Objective    Blood pressure (!) 156/61, pulse 90, temperature 99.1 F (37.3 C), temperature source Oral, resp. rate (!) 29, height 5' 2"  (1.575 m), weight 63.7 kg, SpO2 100 %.        Intake/Output Summary (Last 24 hours) at 07/28/2020 1343 Last data filed at 07/28/2020 3710 Gross per 24 hour  Intake 1159.84 ml  Output 300 ml  Net 859.84 ml   Filed Weights   07/27/20 0537 07/28/20 0500 07/28/20 1110  Weight: 64.7 kg 63.7 kg 63.7 kg    Examination: General: Chronically ill-appearing woman lying in bed, wrist restraints, no distress HENT: Mild pallor, icterus ++, no JVD Lungs: Mild accessory muscle use, clear breath sounds bilateral, no rhonchi Cardiovascular: S1-S2 regular, ejection systolic murmur 2/6 at base Abdomen: Soft, nontender, no guarding, no organomegaly Extremities: No edema, no deformity Neuro: Lethargic, follows one-step commands, grossly nonfocal     Labs/imaging that I havepersonally reviewed  (right click and "Reselect all SmartList Selections" daily)  CT angiogram chest/abdomen/pelvis 5/1 >> mild mediastinal lymphadenopathy, cirrhosis with changes of portal hypertension , thickening of small bowel and cecum?  Low protein, multiple tiny gas bubbles in the left pelvic floor?  Etiology  Serial chest x-rays independently reviewed which Cherina bilateral upper lobe airspace disease which first appeared on 5/3 and a stable since. New airspace disease noted right base and left base   Resolved Hospital Problem list     Assessment & Plan:  Acute on chronic hypoxic respiratory failure -Bilateral infiltrates that seem to have developed after admission -Differential diagnosis includes aspiration pneumonia versus ALI from sepsis, favor former since infiltrates are persistent .  Infiltrate seems to the first occurred on 5/3 and  worsened on 5/10  -Not a great candidate for BiPAP -We were able to dial down to 8 L high flow nasal cannula at bedside -Obtain swallow evaluation -Would  decrease benzodiazepines and narcotics -Agree with empiric HCAP coverage with Zosyn   Acute decompensated liver failure -bilirubin is increased from 5.2-9.4   E. coli UTI -appears to have been treated adequately, procalcitonin is decreased to normal ?  Significance of gas bubbles noted on CT abdomen on admission, no evidence of acute abdomen or viscus perforation on exam  Acute encephalopathy -related to hypoxia and liver failure On focal exam, head CT negative on admit -If mental status worsens, will need NG tube insertion and lactulose -dc loperamide   Updated S/O. Sam in the room and confirmed DNR status  Best practice (right click and "Reselect all SmartList Selections" daily)  Diet:  NPO Pain/Anxiety/Delirium protocol (if indicated): Yes (RASS goal 0) VAP protocol (if indicated): Not indicated DVT prophylaxis: SCD GI prophylaxis: PPI Glucose control:  SSI Yes Central venous access:  Yes, and it is still needed Arterial line:  N/A Foley:  N/A Mobility:  bed rest  PT consulted: Yes Last date of multidisciplinary goals of care discussion [per TRH] Code Status:  DNR ,  Disposition: ICU  Labs   CBC: Recent Labs  Lab 07/24/20 0635 07/25/20 0705 07/26/20 0618 07/27/20 0513 07/28/20 0558  WBC 6.0 7.3 7.1 5.8 8.9  HGB 8.9* 9.4* 7.9* 7.5* 8.4*  HCT 28.2* 29.4* 24.6* 23.5* 26.8*  MCV 107.6* 107.3* 107.4* 109.3* 110.3*  PLT 49* 57* 61* 55* 59*    Basic Metabolic Panel: Recent Labs  Lab 07/24/20 0635 07/25/20 0705 07/26/20 0618 07/27/20 0513 07/28/20 0558  NA 142 145 139 138 137  K 2.8* 2.9* 2.4* 3.5 3.7  CL 106 109 105 107 106  CO2 28 29 30 27 26   GLUCOSE 171* 78 97 86 124*  BUN 29* 28* 24* 20 13  CREATININE 0.34* <0.30* 0.35* 0.33* <0.30*  CALCIUM 9.1 8.8* 7.7* 7.6* 7.9*  MG 1.6* 1.9 1.8 1.9 1.7   GFR: CrCl cannot be calculated (This lab value cannot be used to calculate CrCl because it is not a number: <0.30). Recent Labs  Lab 07/22/20 0604  07/24/20 7262 07/25/20 0705 07/26/20 0618 07/27/20 0513 07/28/20 0558  PROCALCITON  --   --  1.26 0.60 0.43  --   WBC 4.0   < > 7.3 7.1 5.8 8.9  LATICACIDVEN 1.8  --   --   --   --   --    < > = values in this interval not displayed.    Liver Function Tests: Recent Labs  Lab 07/22/20 0604 07/23/20 0355 07/24/20 0635 07/26/20 0618 07/27/20 0513  AST 46* 38 53* 59* 55*  ALT 16 17 19 18 21   ALKPHOS 74 72 77 62 57  BILITOT 6.9* 5.2* 7.8* 10.6* 9.4*  PROT 5.6* 5.0* 5.4* 4.5* 4.4*  ALBUMIN 2.8* 2.4* 2.6* 2.2* 2.0*   No results for input(s): LIPASE, AMYLASE in the last 168 hours. Recent Labs  Lab 07/23/20 1323 07/26/20 0618  AMMONIA 47* 13    ABG    Component Value Date/Time   PHART 7.494 (H) 07/25/2020 1355   PCO2ART 35.9 07/25/2020 1355   PO2ART 69.9 (L) 07/25/2020 1355   HCO3 28.2 (H) 07/25/2020 1355   TCO2 26 05/09/2020 0427   O2SAT 95.3 07/25/2020 1355     Coagulation Profile: Recent Labs  Lab 07/28/20 0558  INR 2.0*  Cardiac Enzymes: No results for input(s): CKTOTAL, CKMB, CKMBINDEX, TROPONINI in the last 168 hours.  HbA1C: Hgb A1c MFr Bld  Date/Time Value Ref Range Status  06/07/2020 07:34 PM 5.1 4.8 - 5.6 % Final    Comment:    (NOTE) Pre diabetes:          5.7%-6.4%  Diabetes:              >6.4%  Glycemic control for   <7.0% adults with diabetes   03/30/2020 07:10 AM 4.7 (L) 4.8 - 5.6 % Final    Comment:    (NOTE) Pre diabetes:          5.7%-6.4%  Diabetes:              >6.4%  Glycemic control for   <7.0% adults with diabetes     CBG: Recent Labs  Lab 07/27/20 1635 07/27/20 2130 07/28/20 0738 07/28/20 1211 07/28/20 1300  GLUCAP 153* 162* 111* 198* 235*    Review of Systems:   Unable to obtain due to poor mental status  Past Medical History:  She,  has a past medical history of Degenerative joint disease, Diabetes mellitus without complication ([place]), Gastroesophageal reflux, Pancreatitis, Panic attack, Sleep apnea, and  Thrombocytopenia ([place]).   Surgical History:   Past Surgical History:  Procedure Laterality Date  . CHOLECYSTECTOMY    . SHOULDER SURGERY     left   . tongue sx. titanium screw placed to hold tongue down    . TOTAL THYROIDECTOMY       Social History:   reports that she has never smoked. She has never used smokeless tobacco. She reports that she does not drink alcohol and does not use drugs.   Family History:  Her family history is not on file.   Allergies Allergies  Allergen Reactions  . Acetaminophen Hives and Other (See Comments)    Tolerates Fioricet  . Cephalexin Itching, Swelling and Other (See Comments)    Tongue swells and makes throat itchy- can tolerate Zosyn  . Ciprofloxacin Hives  . Hydromorphone Hives  . Sulfamethoxazole-Trimethoprim Shortness Of Breath, Swelling and Other (See Comments)    Tongue swells   . Abilify [Aripiprazole]     "I did noyt like the way this made me feel."     Home Medications  Prior to Admission medications   Medication Sig Start Date End Date Taking? Authorizing Provider  albuterol (PROVENTIL) (2.5 MG/3ML) 0.083% nebulizer solution Inhale 2.5 mg into the lungs every 6 (six) hours as needed for wheezing or shortness of breath.  04/19/19  Yes [provider]  albuterol (VENTOLIN HFA) 108 (90 Base) MCG/ACT inhaler Inhale 2 puffs into the lungs every 4 (four) hours as needed for wheezing or shortness of breath. 04/02/20  Yes Emokpae, Courage, MD  Continuous Blood Gluc Sensor (DEXCOM G6 SENSOR) MISC Inject into the skin See admin instructions. Place a new sensor under the skin every 10 days   Yes [provider]  CREON 24000-76000 units CPEP Take 1 capsule by mouth 3 (three) times daily before meals.   Yes [provider]  diphenhydrAMINE (BENADRYL) 25 MG tablet Take 25 mg by mouth every 6 (six) hours as needed for allergies or itching.   Yes [provider]  furosemide (LASIX) 20 MG tablet Take 20 mg by mouth  in the morning and at bedtime.   Yes [provider]  gabapentin (NEURONTIN) 100 MG capsule Take 2 capsules (200 mg total) by mouth 2 (two)  times daily. For agitation/neuropathy Patient taking differently: Take 200 mg by mouth 3 (three) times daily. 05/22/20  Yes Barton Dubois, MD  insulin aspart (NOVOLOG) 100 UNIT/ML injection Inject 10 Units into the skin 3 (three) times daily with meals. For diabetes management Patient taking differently: Inject into the skin See admin instructions. Via Insulin Pump 10/10/19  Yes Nwoko, Herbert Pun I, NP  Insulin Human (INSULIN PUMP) SOLN Inject into the skin continuous.   Yes [provider]  lactulose (CHRONULAC) 10 GM/15ML solution Take 45 mLs (30 g total) by mouth 3 (three) times daily. Patient taking differently: Take 30 g by mouth 3 (three) times daily. Take 30 grams (45 ml's) by mouth three to five times a day until 3 bowel movements a day are achieved, then as directed 04/02/20  Yes Emokpae, Courage, MD  levothyroxine (SYNTHROID, LEVOTHROID) 50 MCG tablet Take 50 mcg by mouth daily before breakfast.  07/25/14  Yes [provider]  NITROGLYCERIN PO Place 1 tablet under the tongue every 5 (five) minutes as needed (as directed, for chest pain).   Yes [provider]  ondansetron (ZOFRAN ODT) 4 MG disintegrating tablet Take 1 tablet (4 mg total) by mouth every 8 (eight) hours as needed. Patient taking differently: Take 4 mg by mouth See admin instructions. Dissolve 4 mg orally one to two times a day 05/09/20  Yes Ripley Fraise, MD  Oxycodone HCl 10 MG TABS Take 10 mg by mouth every 6 (six) hours. 03/06/20  Yes [provider]  OXYGEN Inhale 2-4 L/min into the lungs as needed (for shortness of breath).   Yes [provider]  potassium chloride SA (KLOR-CON) 20 MEQ tablet Take 1 tablet (20 mEq total) by mouth 2 (two) times daily. 04/02/20  Yes Emokpae, Courage, MD  Vitamin D, Ergocalciferol, (DRISDOL) 1.25 MG (50000  UNIT) CAPS capsule Take 1 capsule by mouth once a week. 06/29/20  Yes [provider]  XIFAXAN 550 MG TABS tablet Take 550 mg by mouth 2 (two) times daily. 05/28/20  Yes [provider]  lipase/protease/amylase (CREON) 12000-38000 units CPEP capsule Take 1 capsule (12,000 Units total) by mouth 3 (three) times daily before meals. Patient not taking: No sig reported 04/02/20   Roxan Hockey, MD  megestrol (MEGACE) 40 MG tablet Take 1 tablet (40 mg total) by mouth daily. For Cachexia Patient not taking: No sig reported 10/11/19   Lindell Spar I, NP  mirtazapine (REMERON) 15 MG tablet Take 1 tablet (15 mg total) by mouth at bedtime. For depression Patient not taking: No sig reported 10/10/19   Lindell Spar I, NP  lisinopril (ZESTRIL) 10 MG tablet Take 10 mg by mouth daily. Patient not taking: Reported on 10/02/2019 03/29/19 10/02/19  [provider]  metFORMIN (GLUCOPHAGE) 1000 MG tablet Take 1,000 mg by mouth 2 (two) times daily with a meal.  Patient not taking: Reported on 10/02/2019  10/02/19  [provider]  metoCLOPramide (REGLAN) 10 MG tablet Take 10 mg by mouth 4 (four) times daily.  Patient not taking: Reported on 10/02/2019  10/02/19  [provider]  sertraline (ZOLOFT) 100 MG tablet Take 150 mg by mouth daily. Patient not taking: Reported on 10/02/2019 09/10/16 10/02/19  [provider]  spironolactone (ALDACTONE) 50 MG tablet Take 50 mg by mouth daily. Patient not taking: Reported on 10/02/2019 03/27/19 10/02/19  [provider]  traZODone (DESYREL) 100 MG tablet Take 1 tablet by mouth daily. Patient not taking: Reported on 10/02/2019 09/15/16 10/02/19  [provider]     Kara Mead MD. FCCP. [facility] Pager : [phone]  If no response to pager , please call [phone] until 7 pm After 7:00 pm call [place]  [phone]   07/28/2020

## 2020-07-28 NOTE — Progress Notes (Signed)
PROGRESS NOTE    [name]  RUE:[id number] DOB: 29-May-1965 DOA: 07/19/2020 PCP: [facility] [place]    Chief Complaint  Patient presents with  . Altered Mental Status    Brief Narrative:   Joan Hora Hernandezis a 55 y.o.femalewith medical history significant ofcirrhosis, insulin-dependent diabetes on insulin pump, chronic pancreatitis, thrombocytopenia (usually in the 60s), and hypothyroidism who was admitted on 5/1 for hepatic encephalopathy. See HPI and H&P for details. On admission, pt continued to have confusion and noted new-onset neck pain.She was admitted with sepsis secondary to E. coli UTI without findings of bacteria, but now appears to have worsening mentation with potential sepsis related to multifocal pneumonia. Her antibiotic coverage has been broadened to vancomycin and Zosyn with improvement noted.  Pt had been steadily improving and on 5/9 we anticipated d/c, but overnight 5/9 to 5/10 pt became increasingly confused, disoriented, and was repeatedly stating she "needed to go home". In the AM she was delirious, had increased O2 requirements, was only oriented to self and place, and became somewhat combative with staff while continually ripping off [place]. Bedside O2 sats dropped to upper 50s to mid-80s depending on whether she had the [place] in place. Due to increased O2 requirements and worsening AMS, pt was transferred to stepdown and PCCM consulted.   Assessment & Plan:   Active Problems:   Thrombocytopenia ([place])   Alcoholic cirrhosis of liver without ascites (HCC)   Essential hypertension   Acquired hypothyroidism   Type 2 diabetes mellitus ([place])   Coagulopathy (HCC)   Altered mental status   Sepsis ([place])   Acute lower UTI   DNR (do not resuscitate)   Pancreatic insufficiency   Malnutrition of moderate degree   Pressure injury of skin   Acute metabolic encephalopathy-multifactorial -Initially appeared to be related tosepsis with multifocal apical  pneumonia, initially with E. coli UTI -Pt was improving after initial abx (Vanc and Zosyn) and diuresis from volume overload -UCx revealed E.Coli was pan-sensitive, so pt was switched to augmentin -Some bouts of confusion ensued, and Xanax and gabapentin were discontinued with improving mentation -Mentation was improved to baseline by 5/9, but acutely dropped within 24hr with concurrent increase in O2 requirements - 5/10 Pt became confused, combative, demanded to go home, was not oriented, and made several attempts to pull herself out of the bed. Pt was unable to answer simple questions and refused to wear [place] despite SpO2 occasionally dropping to upper 50s, so the decision to admit her to stepdown status was made       - CXR on 5/10 revealed worsening multifocal PNA in bibasilar regions, new since CXR on admission      - Acute AMS likely related to increased O2 requirements in setting of HCAP      - Pt currently in ICU in soft non-violent restraints, in NAD per ICU note   Acute on chronic hypoxemic respiratory failure with multifocal pneumonia-now likely HCAP -On admission from ED, pt was MRSA PCR+, and received DuoNebs q6, mucomyst, and 2D echo w no acute findings and normal EF -Found to have E.Coli UTI with sepsis and some apical multifocal areas of consolidation on CXR    - Pt was on broad spectrum antibiotics, and once UCx revealed pansensitive E.Coli, pt was switched to Augmentin    - Pt was improving in both O2 requirements and mentation with Abx and diuresis but 5/10 had a significant increase in O2 requirements and profound drop in mentation -Repeat CXR today 5/10 now noted  to have new bibasilar infiltrates compared to admission -Physical exam and CXR this AM were both c/w acute increase in volume overload     - Pt started on iv Lasix for diuresis and resolution of pulmonary congestion     - BNP today 5/10 was elevated to 185 but lower than 2d ago -[place] consulted 5/10,  appreciate recs:     -Given concern for HCAP vs Aspiration PNA, switched from Augmentin back to broad-spectrum Vanc and Zosyn     -Pt not a good candidate for BiPAP, use [place] as permitted      -Pt currently in ICU on on 7 L [place], compared to 2-4 L at home. -Leukocytosis had been downtrending since 5/3, but WBC jumped from 6 to 9 last night (5/9 - 5/10)    -Repeat CBC in AM   Persistent hypokalemia with noted diarrhea, resolved -Lactulose currently held -Stool C. difficile negative -GI panel negative -Continuing to follow with daily BMPs  Thrombocytopenia/elevated INR secondary to alcoholic liver cirrhosis-stable -Continue to monitor -Holding lactulose secondary to above -Platelets stable ~60 -Since admission PTT has been increased and stable ~20 and INR has ranged 1.7-2.1  -No clinical signs of bleeding including bruising, ecchymoses, or petechiae -Will recheck PTT/INR in AM  Anemia-stable -Multifactorial, given Fe studies, Ovalocytes, and Macrocytosis despite nl B12 and Folate levels -Monitor w repeat CBCs  Type2 diabetes -Holding Levemir until appetite improves.  -Advanced to soft 5/8 -Continue SSI -Decrease in mentation on 5/10 required diet changed to NPO  Hypothyroidism -Continue levothyroxine  Pancreatic insufficiency in the setting of chronic pancreatitis -Continue home [place] meals   DVT prophylaxis: SCDs  Code Status: DNR (confirmed on presentation) Family Communication: Discussed with husband at bedside 5/10  Disposition:   Status is: Inpatient  Remains inpatient appropriate because:Altered mental status and Inpatient level of care appropriate due to severity of illness   Dispo: The patient is from: Home              Anticipated d/c is to: Home              Patient currently is not medically stable to d/c.   Difficult to place patient No       Consultants:   PCCM  PT (cancelled due to acute AMS today)  Pharmacy  Procedures:   See  below  Antimicrobials:   Today 5/10 pt switched to broad spectrum Vanc and Zosyn for empiric coverage of HCAP, per PCCM and Pharm recs   Subjective: Patient seen today 5/10. Overnight 5/9 to 5/10 pt became increasingly confused, disoriented, and was repeatedly stating she "needed to go home". In the AM she was delirious, had increased O2 requirements, was only oriented to self and place, and became somewhat combative with staff while continually ripping off [place]. Bedside O2 sats dropped to upper 50s to mid-80s depending on whether she had the [place] in place. Pt tried to pull herself off bed and was unable to converse appropriately or cooperate in any physical exam. Pt later ripped her clothes off in an attempt to convince her husband to put on her normal clothes so she could go home. Given level of severity in mentation, pt transferred to stepdown.  Objective: Vitals:   07/28/20 1100 07/28/20 1110 07/28/20 1300 07/28/20 1340  BP:   (!) 156/61   Pulse:   90   Resp:   (!) 29   Temp:      TempSrc:      SpO2: 95%  100% 100% 96%  Weight:  63.7 kg    Height:  5' 2"  (1.575 m)      Intake/Output Summary (Last 24 hours) at 07/28/2020 1527 Last data filed at 07/28/2020 2423 Gross per 24 hour  Intake 919.84 ml  Output 300 ml  Net 619.84 ml   Filed Weights   07/27/20 0537 07/28/20 0500 07/28/20 1110  Weight: 64.7 kg 63.7 kg 63.7 kg    Examination in AM:  General exam: Appears confused, agitated, and sitting at bedside stating she "wants to go home" repeatedly while ignoring or unable to follow commands. Speech was slow but understandable. Respiratory system: UTA auscultation due to pt's combative AMS. Respiratory effort normal. Pt refusing [place]. Cardiovascular system: UTA cardiac exam. Lower extremity non-pitting edema increased today. Gastrointestinal system: UTA abdominal exam beyond noting non-distention.  Central nervous system: Only oriented to self and place. Dysarthric and confused. No  focal neurological deficits. Extremities: Moving all limbs spontaneously, occasionally intentionally leaning on examiner while trying to ambulate out of bed with uncoordinated movements. Skin: No new lesions or ulcers noted. Psychiatry: Judgement and insight do not appear appropriate. Pt unable to respond to questions regarding how she would do at home if she left today. Her goals did not match her decision-making, demonstrating a significant lack of capacity. Mood is tired and combative.    Data Reviewed: I have personally reviewed the following labs and imaging studies.  CBC: Recent Labs  Lab 07/24/20 0635 07/25/20 0705 07/26/20 0618 07/27/20 0513 07/28/20 0558  WBC 6.0 7.3 7.1 5.8 8.9  HGB 8.9* 9.4* 7.9* 7.5* 8.4*  HCT 28.2* 29.4* 24.6* 23.5* 26.8*  MCV 107.6* 107.3* 107.4* 109.3* 110.3*  PLT 49* 57* 61* 55* 59*    Basic Metabolic Panel: Recent Labs  Lab 07/24/20 0635 07/25/20 0705 07/26/20 0618 07/27/20 0513 07/28/20 0558  NA 142 145 139 138 137  K 2.8* 2.9* 2.4* 3.5 3.7  CL 106 109 105 107 106  CO2 28 29 30 27 26   GLUCOSE 171* 78 97 86 124*  BUN 29* 28* 24* 20 13  CREATININE 0.34* <0.30* 0.35* 0.33* <0.30*  CALCIUM 9.1 8.8* 7.7* 7.6* 7.9*  MG 1.6* 1.9 1.8 1.9 1.7    GFR: CrCl cannot be calculated (This lab value cannot be used to calculate CrCl because it is not a number: <0.30).  Liver Function Tests: Recent Labs  Lab 07/22/20 0604 07/23/20 0634 07/24/20 0635 07/26/20 0618 07/27/20 0513  AST 46* 38 53* 59* 55*  ALT 16 17 19 18 21   ALKPHOS 74 72 77 62 57  BILITOT 6.9* 5.2* 7.8* 10.6* 9.4*  PROT 5.6* 5.0* 5.4* 4.5* 4.4*  ALBUMIN 2.8* 2.4* 2.6* 2.2* 2.0*    CBG: Recent Labs  Lab 07/27/20 1635 07/27/20 2130 07/28/20 0738 07/28/20 1211 07/28/20 1300  GLUCAP 153* 162* 111* 198* 235*     Recent Results (from the past 240 hour(s))  Resp Panel by RT-PCR (Flu A&B, Covid) Nasopharyngeal Swab     Status: None   Collection Time: 07/19/20  9:59 AM    Specimen: Nasopharyngeal Swab; Nasopharyngeal(NP) swabs in vial transport medium  Result Value Ref Range Status   SARS Coronavirus 2 by RT PCR NEGATIVE NEGATIVE Final    Comment: (NOTE) SARS-CoV-2 target nucleic acids are NOT DETECTED.  The SARS-CoV-2 RNA is generally detectable in upper respiratory specimens during the acute phase of infection. The lowest concentration of SARS-CoV-2 viral copies this assay can detect is 138 copies/mL. A negative result does not preclude  SARS-Cov-2 infection and should not be used as the sole basis for treatment or other patient management decisions. A negative result may occur with  improper specimen collection/handling, submission of specimen other than nasopharyngeal swab, presence of viral mutation(s) within the areas targeted by this assay, and inadequate number of viral copies(<138 copies/mL). A negative result must be combined with clinical observations, patient history, and epidemiological information. The expected result is Negative.  Fact Sheet for Patients:  EntrepreneurPulse.com.au  Fact Sheet for Healthcare Providers:  IncredibleEmployment.be  This test is no t yet approved or cleared by the [place] FDA and  has been authorized for detection and/or diagnosis of SARS-CoV-2 by FDA under an Emergency Use Authorization (EUA). This EUA will remain  in effect (meaning this test can be used) for the duration of the COVID-19 declaration under Section 564(b)(1) of the Act, 21 U.S.C.section 360bbb-3(b)(1), unless the authorization is terminated  or revoked sooner.       Influenza A by PCR NEGATIVE NEGATIVE Final   Influenza B by PCR NEGATIVE NEGATIVE Final    Comment: (NOTE) The Xpert Xpress SARS-CoV-2/FLU/RSV plus assay is intended as an aid in the diagnosis of influenza from Nasopharyngeal swab specimens and should not be used as a sole basis for treatment. Nasal washings and aspirates are  unacceptable for Xpert Xpress SARS-CoV-2/FLU/RSV testing.  Fact Sheet for Patients: EntrepreneurPulse.com.au  Fact Sheet for Healthcare Providers: IncredibleEmployment.be  This test is not yet approved or cleared by the [place] FDA and has been authorized for detection and/or diagnosis of SARS-CoV-2 by FDA under an Emergency Use Authorization (EUA). This EUA will remain in effect (meaning this test can be used) for the duration of the COVID-19 declaration under Section 564(b)(1) of the Act, 21 U.S.C. section 360bbb-3(b)(1), unless the authorization is terminated or revoked.  Performed at [facility], [address], [place], [place] [address]   Blood Culture (routine x 2)     Status: None   Collection Time: 07/19/20 10:10 AM   Specimen: BLOOD RIGHT FOREARM  Result Value Ref Range Status   Specimen Description   Final    BLOOD RIGHT FOREARM BOTTLES DRAWN AEROBIC AND ANAEROBIC   Special Requests Blood Culture adequate volume  Final   Culture   Final    NO GROWTH 5 DAYS Performed at [facility], [address] [place], [place] [address]    Report Status 07/24/2020 FINAL  Final  Blood Culture (routine x 2)     Status: None   Collection Time: 07/19/20 10:10 AM   Specimen: Left Antecubital; Blood  Result Value Ref Range Status   Specimen Description   Final    LEFT ANTECUBITAL BOTTLES DRAWN AEROBIC AND ANAEROBIC   Special Requests   Final    Blood Culture results may not be optimal due to an excessive volume of blood received in culture bottles   Culture   Final    NO GROWTH 5 DAYS Performed at [facility], [address], [place], [place] [address]    Report Status 07/24/2020 FINAL  Final  Urine culture     Status: Abnormal   Collection Time: 07/19/20 11:30 AM   Specimen: In/Out Cath Urine  Result Value Ref Range Status   Specimen Description   Final    IN/OUT CATH URINE Performed at [facility], [address] [place],  [place] [address]    Special Requests   Final    NONE Performed at [facility], [address], [place],  [address]  Culture >=100,000 COLONIES/mL ESCHERICHIA COLI (A)  Final   Report Status 07/22/2020 FINAL  Final   Organism ID, Bacteria ESCHERICHIA COLI (A)  Final      Susceptibility   Escherichia coli - MIC*    AMPICILLIN <=2 SENSITIVE Sensitive     CEFAZOLIN <=4 SENSITIVE Sensitive     CEFEPIME <=0.12 SENSITIVE Sensitive     CEFTRIAXONE <=0.25 SENSITIVE Sensitive     CIPROFLOXACIN <=0.25 SENSITIVE Sensitive     GENTAMICIN <=1 SENSITIVE Sensitive     IMIPENEM <=0.25 SENSITIVE Sensitive     NITROFURANTOIN <=16 SENSITIVE Sensitive     TRIMETH/SULFA <=20 SENSITIVE Sensitive     AMPICILLIN/SULBACTAM <=2 SENSITIVE Sensitive     PIP/TAZO <=4 SENSITIVE Sensitive     * >=100,000 COLONIES/mL ESCHERICHIA COLI  MRSA PCR Screening     Status: Abnormal   Collection Time: 07/19/20  5:18 PM   Specimen: Nasopharyngeal  Result Value Ref Range Status   MRSA by PCR POSITIVE (A) NEGATIVE Final    Comment:        The GeneXpert MRSA Assay (FDA approved for NASAL specimens only), is one component of a comprehensive MRSA colonization surveillance program. It is not intended to diagnose MRSA infection nor to guide or monitor treatment for MRSA infections. RESULT CALLED TO, READ BACK BY AND VERIFIED WITH: LEDWELL, L@1145  by [email or web address],b 5.2.22 Performed at [facility], [address], [place], [place] [address]   Gastrointestinal Panel by PCR , Stool     Status: None   Collection Time: 07/24/20  4:51 PM   Specimen: Stool  Result Value Ref Range Status   Campylobacter species NOT DETECTED NOT DETECTED Final   Plesimonas shigelloides NOT DETECTED NOT DETECTED Final   Salmonella species NOT DETECTED NOT DETECTED Final   Yersinia enterocolitica NOT DETECTED NOT DETECTED Final   Vibrio species NOT DETECTED NOT DETECTED Final   Vibrio cholerae NOT DETECTED NOT DETECTED Final   Enteroaggregative  E coli (EAEC) NOT DETECTED NOT DETECTED Final   Enteropathogenic E coli (EPEC) NOT DETECTED NOT DETECTED Final   Enterotoxigenic E coli (ETEC) NOT DETECTED NOT DETECTED Final   Shiga like toxin producing E coli (STEC) NOT DETECTED NOT DETECTED Final   Shigella/Enteroinvasive E coli (EIEC) NOT DETECTED NOT DETECTED Final   Cryptosporidium NOT DETECTED NOT DETECTED Final   Cyclospora cayetanensis NOT DETECTED NOT DETECTED Final   Entamoeba histolytica NOT DETECTED NOT DETECTED Final   Giardia lamblia NOT DETECTED NOT DETECTED Final   Adenovirus F40/41 NOT DETECTED NOT DETECTED Final   Astrovirus NOT DETECTED NOT DETECTED Final   Norovirus GI/GII NOT DETECTED NOT DETECTED Final   Rotavirus A NOT DETECTED NOT DETECTED Final   Sapovirus (I, II, IV, and V) NOT DETECTED NOT DETECTED Final    Comment: Performed at [facility], [address] [place], [place] [address]  C Difficile Quick Screen (NO PCR Reflex)     Status: None   Collection Time: 07/24/20  4:51 PM   Specimen: Stool  Result Value Ref Range Status   C Diff antigen NEGATIVE NEGATIVE Final   C Diff toxin NEGATIVE NEGATIVE Final   C Diff interpretation No C. difficile detected.  Final    Comment: Performed at [facility], [address], [place],  [address]         Radiology Studies: [facility] [place] 1 View  Result Date: 07/28/2020 CLINICAL DATA:  Shortness of breath EXAM: PORTABLE CHEST 1 VIEW COMPARISON:  Jul 25, 2020 FINDINGS: Central catheter  tip in superior vena cava. No appreciable pneumothorax. There is a left pleural effusion with consolidation in the left lower lobe, new from most recent chest radiograph. Extensive airspace opacity in each upper lobe is stable. New patchy airspace opacity noted in right base. Heart is upper normal in size with pulmonary vascularity normal. No adenopathy. No bone lesions. IMPRESSION: Central catheter as described without appreciable pneumothorax. New infiltrate left base with  small left pleural effusion. Extensive upper lobe airspace opacity bilaterally essentially stable. New patchy airspace opacity right base. Appearance consistent with multifocal pneumonia, progressed from most recent study. Stable cardiac silhouette. Electronically Signed   By: Lowella Grip III M.D.   On: 07/28/2020 11:14        Scheduled Meds: . (feeding supplement) PROSource Plus  30 mL Oral BID BM  . acetylcysteine  1 mL Nebulization BID  . Chlorhexidine Gluconate Cloth  6 each Topical Q0600  . feeding supplement  237 mL Oral BID BM  . furosemide  40 mg Intravenous Q12H  . insulin aspart  0-15 Units Subcutaneous TID WC  . insulin aspart  0-5 Units Subcutaneous QHS  . insulin aspart  3 Units Subcutaneous TID WC  . insulin detemir  10 Units Subcutaneous BID  . ipratropium-albuterol  3 mL Nebulization TID  . levothyroxine  50 mcg Oral QAC breakfast  . lipase/protease/amylase  24,000 Units Oral TID AC  . mouth rinse  15 mL Mouth Rinse BID  . multivitamin with minerals  1 tablet Oral Daily  . rifaximin  550 mg Oral BID   Continuous Infusions: . piperacillin-tazobactam (ZOSYN)  IV 3.375 g (07/28/20 1443)  . vancomycin       LOS: 9 days    Time spent: [place], MS4 [facility]   To contact the attending provider between 7A-7P or the covering provider during after hours 7P-7A, please log into the web site [email or web address] and access using universal [place] password for that web site. If you do not have the password, please call the hospital operator.  07/28/2020, 3:27 PM

## 2020-07-28 NOTE — Progress Notes (Addendum)
Pharmacy Antibiotic Note  [name] is a 55 y.o. female admitted on 07/19/2020 with pneumonia.  Pharmacy has been consulted for Vancomycin and zosyn dosing. Patient with multifocal PNA. Afebrile, some concern with aspiration. She has been hypoxic and continued need for increased O2 from baseline.   Plan: Change Vancomycin 124m IV every 24 hours. Expected AUC 496 Continue Zosyn 3.375g IV q8h (4 hour infusion). F/U cultures and clinical progress Monitor V/S, labs and levels as indicated  Height: 5' 2"  (157.5 cm) Weight: 63.7 kg (140 lb 6.9 oz) IBW/kg (Calculated) : 50.1  Temp (24hrs), Avg:98.8 F (37.1 C), Min:98.5 F (36.9 C), Max:99.1 F (37.3 C)  Recent Labs  Lab 07/22/20 0604 07/22/20 2140 07/24/20 0635 07/25/20 0705 07/26/20 0618 07/27/20 0513 07/28/20 0558  WBC 4.0  --  6.0 7.3 7.1 5.8 8.9  CREATININE 0.52   < > 0.34* <0.30* 0.35* 0.33* <0.30*  LATICACIDVEN 1.8  --   --   --   --   --   --    < > = values in this interval not displayed.    CrCl cannot be calculated (This lab value cannot be used to calculate CrCl because it is not a number: <0.30).    Allergies  Allergen Reactions  . Acetaminophen Hives and Other (See Comments)    Tolerates Fioricet  . Cephalexin Itching, Swelling and Other (See Comments)    Tongue swells and makes throat itchy- can tolerate Zosyn  . Ciprofloxacin Hives  . Hydromorphone Hives  . Sulfamethoxazole-Trimethoprim Shortness Of Breath, Swelling and Other (See Comments)    Tongue swells   . Abilify [Aripiprazole]     "I did noyt like the way this made me feel."   Normalized CrCl ~ 877m.min  Antimicrobials this admission: 5/1 aztreonam >> 5/2 Zosyn 5/2 >> 5/3, restarted 5/7>> 5/1 vancomycin >> 5/3, restarted 5/7 >> 5/1 metronidazole >>5/3 5/4 augmentin>> 5/7 xifaxan PTA >>   Microbiology results: 5/4 Ucx: pan sensitive e coli  5/2 MRSA PCR: positive 5/1 Bcx: NGTD  5/6 C Diff: negative   Thank you for allowing  pharmacy to be a part of this patient's care.  [name]

## 2020-07-28 NOTE — Progress Notes (Signed)
PT Cancellation Note  Patient Details Name: NICKEY CANEDO MRN: [id number] DOB: 1965/04/14   Cancelled Treatment:    Reason Eval/Treat Not Completed: Patient not medically ready. Pt transferred to ICU requiring higher level of care. PT will sign off at this time, please re-consult when appropriate.    Talbot Grumbling PT, DPT 07/28/20, 1:13 PM

## 2020-07-29 ENCOUNTER — Inpatient Hospital Stay (HOSPITAL_COMMUNITY): Payer: Medicare HMO

## 2020-07-29 DIAGNOSIS — J9601 Acute respiratory failure with hypoxia: Secondary | ICD-10-CM

## 2020-07-29 DIAGNOSIS — E440 Moderate protein-calorie malnutrition: Secondary | ICD-10-CM

## 2020-07-29 DIAGNOSIS — K7030 Alcoholic cirrhosis of liver without ascites: Secondary | ICD-10-CM | POA: Diagnosis not present

## 2020-07-29 DIAGNOSIS — D689 Coagulation defect, unspecified: Secondary | ICD-10-CM

## 2020-07-29 DIAGNOSIS — T17908D Unspecified foreign body in respiratory tract, part unspecified causing other injury, subsequent encounter: Secondary | ICD-10-CM

## 2020-07-29 DIAGNOSIS — R4182 Altered mental status, unspecified: Secondary | ICD-10-CM | POA: Diagnosis not present

## 2020-07-29 DIAGNOSIS — I10 Essential (primary) hypertension: Secondary | ICD-10-CM

## 2020-07-29 DIAGNOSIS — E039 Hypothyroidism, unspecified: Secondary | ICD-10-CM | POA: Diagnosis not present

## 2020-07-29 DIAGNOSIS — E872 Acidosis: Secondary | ICD-10-CM

## 2020-07-29 LAB — CBC
HCT: 24.8 % — ABNORMAL LOW (ref 36.0–46.0)
Hemoglobin: 7.7 g/dL — ABNORMAL LOW (ref 12.0–15.0)
MCH: 34.5 pg — ABNORMAL HIGH (ref 26.0–34.0)
MCHC: 31 g/dL (ref 30.0–36.0)
MCV: 111.2 fL — ABNORMAL HIGH (ref 80.0–100.0)
Platelets: 51 10*3/uL — ABNORMAL LOW (ref 150–400)
RBC: 2.23 MIL/uL — ABNORMAL LOW (ref 3.87–5.11)
RDW: 18.7 % — ABNORMAL HIGH (ref 11.5–15.5)
WBC: 8.4 10*3/uL (ref 4.0–10.5)
nRBC: 0 % (ref 0.0–0.2)

## 2020-07-29 LAB — COMPREHENSIVE METABOLIC PANEL
ALT: 22 U/L (ref 0–44)
AST: 58 U/L — ABNORMAL HIGH (ref 15–41)
Albumin: 1.9 g/dL — ABNORMAL LOW (ref 3.5–5.0)
Alkaline Phosphatase: 59 U/L (ref 38–126)
Anion gap: 5 (ref 5–15)
BUN: 12 mg/dL (ref 6–20)
CO2: 28 mmol/L (ref 22–32)
Calcium: 7.9 mg/dL — ABNORMAL LOW (ref 8.9–10.3)
Chloride: 105 mmol/L (ref 98–111)
Creatinine, Ser: 0.36 mg/dL — ABNORMAL LOW (ref 0.44–1.00)
GFR, Estimated: >60 mL/min (ref >60)
Glucose, Bld: 70 mg/dL (ref 70–99)
Potassium: 3.5 mmol/L (ref 3.5–5.1)
Sodium: 138 mmol/L (ref 135–145)
Total Bilirubin: 9.4 mg/dL — ABNORMAL HIGH (ref 0.3–1.2)
Total Protein: 4.7 g/dL — ABNORMAL LOW (ref 6.5–8.1)

## 2020-07-29 LAB — BILIRUBIN, FRACTIONATED(TOT/DIR/INDIR)
Bilirubin, Direct: 4.9 mg/dL — ABNORMAL HIGH (ref 0.0–0.2)
Indirect Bilirubin: 5.4 mg/dL — ABNORMAL HIGH (ref 0.3–0.9)
Total Bilirubin: 10.3 mg/dL — ABNORMAL HIGH (ref 0.3–1.2)

## 2020-07-29 LAB — GLUCOSE, CAPILLARY
Glucose-Capillary: 86 mg/dL (ref 70–99)
Glucose-Capillary: 272 mg/dL — ABNORMAL HIGH (ref 70–99)
Glucose-Capillary: 185 mg/dL — ABNORMAL HIGH (ref 70–99)
Glucose-Capillary: 230 mg/dL — ABNORMAL HIGH (ref 70–99)

## 2020-07-29 LAB — VANCOMYCIN, TROUGH: Vancomycin Tr: 5 ug/mL — ABNORMAL LOW (ref 15–20)

## 2020-07-29 LAB — PROTIME-INR
INR: 2.4 — ABNORMAL HIGH (ref 0.8–1.2)
Prothrombin Time: 25.9 seconds — ABNORMAL HIGH (ref 11.4–15.2)

## 2020-07-29 LAB — AMMONIA: Ammonia: 56 umol/L — ABNORMAL HIGH (ref 9–35)

## 2020-07-29 LAB — MAGNESIUM: Magnesium: 1.5 mg/dL — ABNORMAL LOW (ref 1.7–2.4)

## 2020-07-29 MED ORDER — PANCRELIPASE (LIP-PROT-AMYL) 12000-38000 UNITS PO CPEP
48000.0000 [IU] | ORAL_CAPSULE | Freq: Three times a day (TID) | ORAL | Status: DC
  Administered 2020-07-29 – 2020-07-31 (×5): 48000 [IU] via ORAL
  Filled 2020-07-29: qty 4
  Filled 2020-07-29 (×2): qty 1
  Filled 2020-07-29: qty 4
  Filled 2020-07-29: qty 1

## 2020-07-29 MED ORDER — LACTULOSE 10 GM/15ML PO SOLN
30.0000 g | Freq: Two times a day (BID) | ORAL | Status: DC
  Administered 2020-07-29 – 2020-07-31 (×4): 30 g via ORAL
  Filled 2020-07-29 (×4): qty 60

## 2020-07-29 MED ORDER — PANCRELIPASE (LIP-PROT-AMYL) 12000-38000 UNITS PO CPEP
24000.0000 [IU] | ORAL_CAPSULE | ORAL | Status: DC
  Administered 2020-07-30: 24000 [IU] via ORAL
  Filled 2020-07-29: qty 2

## 2020-07-29 MED ORDER — FUROSEMIDE 10 MG/ML IJ SOLN: 40.0000 mg | Freq: Every day | INTRAMUSCULAR | Status: DC

## 2020-07-29 MED ORDER — VITAMIN K1 10 MG/ML IJ SOLN
10.0000 mg | Freq: Once | INTRAMUSCULAR | Status: AC
  Administered 2020-07-29: 10 mg via SUBCUTANEOUS
  Filled 2020-07-29: qty 1

## 2020-07-29 MED ORDER — INSULIN DETEMIR 100 UNIT/ML ~~LOC~~ SOLN
8.0000 [IU] | Freq: Two times a day (BID) | SUBCUTANEOUS | Status: DC
  Administered 2020-07-29 – 2020-07-31 (×5): 8 [IU] via SUBCUTANEOUS
  Filled 2020-07-29 (×11): qty 0.08

## 2020-07-29 MED ORDER — VANCOMYCIN HCL 1250 MG/250ML IV SOLN: 1250.0000 mg | Freq: Two times a day (BID) | INTRAVENOUS | Status: DC

## 2020-07-29 MED ORDER — MAGNESIUM SULFATE 4 GM/100ML IV SOLN
4.0000 g | Freq: Once | INTRAVENOUS | Status: AC
  Administered 2020-07-29: 4 g via INTRAVENOUS
  Filled 2020-07-29: qty 100

## 2020-07-29 MED ORDER — ALBUTEROL SULFATE (2.5 MG/3ML) 0.083% IN NEBU
INHALATION_SOLUTION | RESPIRATORY_TRACT | Status: AC
  Administered 2020-07-29: 2.5 mg
  Filled 2020-07-29: qty 3

## 2020-07-29 NOTE — Progress Notes (Signed)
Pharmacy Antibiotic Note  [name] is a 55 y.o. female admitted on 07/19/2020 with pneumonia.  Pharmacy has been consulted for Vancomycin and Zosyn dosing.   Vancomycin trough 5 mcg/ml (subtherapeutic). Pt on 1233m IV q24h but trough was drawn only before second dose on this schedule so unlikely pt is at steady state. SCr 0.36, UOP 1350 ml past 18 hours.  Plan: Change Vancomycin to 12525mIV every 12 hours Continue Zosyn 3.375g IV q8h (4 hour infusion). F/U cultures and clinical progress Will check Vanc trough at [facility]  Height: 5' 2"  (157.5 cm) Weight: 60.3 kg (132 lb 15 oz) IBW/kg (Calculated) : 50.1  Temp (24hrs), Avg:98.3 F (36.8 C), Min:97.6 F (36.4 C), Max:98.7 F (37.1 C)  Recent Labs  Lab 07/25/20 0705 07/26/20 0618 07/27/20 0513 07/28/20 0558 07/29/20 0424 07/29/20 2137  WBC 7.3 7.1 5.8 8.9 8.4  --   CREATININE <0.30* 0.35* 0.33* <0.30* 0.36*  --   VANCOTROUGH  --   --   --   --   --  5*    Estimated Creatinine Clearance: 66.4 mL/min (A) (by C-G formula based on SCr of 0.36 mg/dL (L)).    Allergies  Allergen Reactions  . Acetaminophen Hives and Other (See Comments)    Tolerates Fioricet  . Cephalexin Itching, Swelling and Other (See Comments)    Tongue swells and makes throat itchy- can tolerate Zosyn  . Ciprofloxacin Hives  . Hydromorphone Hives  . Sulfamethoxazole-Trimethoprim Shortness Of Breath, Swelling and Other (See Comments)    Tongue swells   . Abilify [Aripiprazole]     "I did noyt like the way this made me feel."    Antimicrobials this admission: 5/1 aztreonam >> 5/2 Zosyn 5/2 >> 5/3, restarted 5/7>> 5/1 vancomycin >> 5/3, restarted 5/7 >> 5/1 metronidazole >>5/3 5/4 augmentin>> 5/7 xifaxan PTA >>   Microbiology results: 5/4 Ucx: pan sensitive e coli  5/2 MRSA PCR: positive 5/1 Bcx: Negative 5/6 C Diff: negative   Thank you for allowing pharmacy to be a part of this patient's care.  CaSherlon HandingPharmD, BCPS Please see  amion for complete clinical pharmacist phone list 07/29/2020 11:38 PM

## 2020-07-29 NOTE — Consult Note (Signed)
@LOGO @   Referring Provider: [facility] Primary Care Physician:  [facility] [place] Primary Gastroenterologist:  [place]  Date of Admission: 07/19/2020 Date of Consultation: 07/29/2020  Reason for Consultation: Decompensated cirrhosis, hyperbilirubinemia.  HPI:  [name] is a 55 y.o. year old female with medical history significant ofcirrhosis suspected primarily NASH and somewhat a previous history of alcohol use, insulin-dependent diabetes on insulin pump, chronic pancreatitis, thrombocytopenia (usually in the 60s), and hypothyroidism who was admitted on 5/1 for altered mental status,  sepsis secondary to E. coli UTI and multifocal apical pneumonia.  CT head negative on admission.  CT A/P with contrast did not reveal a source of infection except for some gas bubbles noted in the pelvic floor, nonspecific.  There was low-density wall thickening in the cecum and apparent wall thickening and potential edema in proximal small bowel loops with considerations including infectious/inflammatory etiology versus secondary to hypoproteinemia.  Also with marked dilation of extrahepatic bile ducts s/p cholecystectomy similar to prior studies. Ammonia slightly elevated at 46 on admission. She was clinically improving with broad-spectrum IV antibiotics, diuretics, lactulose, and xifaxan.  Xanax and gabapentin were also discontinued with improving mentation, later resumed due to concern for benzo withdrawal.  On 5/10, she developed worsening mentation, lethargy, some combativeness, and required increased O2 requirements yesterday.  Repeat chest x-ray with some worsening multifocal PNA in bibasilar regions.  Concern for aspiration.  She was restarted on Lasix, IV antibiotics, and order for SLP placed.  GI was consulted today due to hyperbilirubinemia with concerns for decompensated cirrhosis.  Her liver enzymes have been stable since admission, but total bilirubin has been  increasing.  Chronic hyperbilirubinemia as outpatient, up to 5.1 on 06/05/2020.  On admission, bilirubin 6.6.  Increased to 10.6 on 5/8, back down to 9.4 on 5/9 where it has remained.  INR chronically elevated and fairly stable ranging from 1.7- 2.2 this admission.  INR 2.0 yesterday.  Ammonia increased to 56 today.  It had normalized to 13, 3 days ago.  Notably, lactulose was discontinued on 5/6.  She continues on Xifaxan 550 twice daily.  She follows with [facility] liver clinic and was last seen in March 2022.  Noted multiple hospitalizations previously for uncontrolled hepatic encephalopathy.  She was not taking Xifaxan which had been previously prescribed.  Did not recommend liver transplant due to comorbidities.    Today's he states she is feeling better.  Does not remember much about the last 24 hours but is alert and oriented x4 today. Denies shortness of breath.  Still on 7 L nasal cannula.  Regarding cirrhosis, she reports taking lactulose 3 times daily and Xifaxan twice daily with at least 2+ bowel movements daily.  Denies any history of overt GI bleeding.  Also takes Lasix at home to prevent swelling in the lower extremities.  Reports chronic history of abdominal pain in the setting of chronic pancreatitis.  She takes Creon, 2 capsules with meals and 1 capsule with snacks.  Abdominal pain primarily in the upper abdomen, postprandial, lasting about 30 minutes.  Denies NSAIDs, alcohol use, or Tylenol use.  Denies nausea or vomiting.  States she eats well in general despite her abdominal pain.  Denies any worsening abdominal pain since presenting to the emergency room.  States she has not having bowel movements.  She is hungry and asking to eat.  Denies dysphagia or coughing while eating.   Past Medical History:  Diagnosis Date  . Degenerative joint disease   .  Diabetes mellitus without complication ([place])   . Gastroesophageal reflux   . Pancreatitis   . Panic attack   . Sleep apnea   .  Thrombocytopenia ([place])     Past Surgical History:  Procedure Laterality Date  . CHOLECYSTECTOMY    . SHOULDER SURGERY     left   . tongue sx. titanium screw placed to hold tongue down    . TOTAL THYROIDECTOMY      Prior to Admission medications   Medication Sig Start Date End Date Taking? Authorizing Provider  albuterol (PROVENTIL) (2.5 MG/3ML) 0.083% nebulizer solution Inhale 2.5 mg into the lungs every 6 (six) hours as needed for wheezing or shortness of breath.  04/19/19  Yes [provider]  albuterol (VENTOLIN HFA) 108 (90 Base) MCG/ACT inhaler Inhale 2 puffs into the lungs every 4 (four) hours as needed for wheezing or shortness of breath. 04/02/20  Yes Emokpae, Courage, MD  Continuous Blood Gluc Sensor (DEXCOM G6 SENSOR) MISC Inject into the skin See admin instructions. Place a new sensor under the skin every 10 days   Yes [provider]  CREON 24000-76000 units CPEP Take 1 capsule by mouth 3 (three) times daily before meals.   Yes [provider]  diphenhydrAMINE (BENADRYL) 25 MG tablet Take 25 mg by mouth every 6 (six) hours as needed for allergies or itching.   Yes [provider]  furosemide (LASIX) 20 MG tablet Take 20 mg by mouth in the morning and at bedtime.   Yes [provider]  gabapentin (NEURONTIN) 100 MG capsule Take 2 capsules (200 mg total) by mouth 2 (two) times daily. For agitation/neuropathy Patient taking differently: Take 200 mg by mouth 3 (three) times daily. 05/22/20  Yes Barton Dubois, MD  insulin aspart (NOVOLOG) 100 UNIT/ML injection Inject 10 Units into the skin 3 (three) times daily with meals. For diabetes management Patient taking differently: Inject into the skin See admin instructions. Via Insulin Pump 10/10/19  Yes Nwoko, Herbert Pun I, NP  Insulin Human (INSULIN PUMP) SOLN Inject into the skin continuous.   Yes [provider]  lactulose (CHRONULAC) 10 GM/15ML solution Take 45 mLs (30 g total) by mouth 3  (three) times daily. Patient taking differently: Take 30 g by mouth 3 (three) times daily. Take 30 grams (45 ml's) by mouth three to five times a day until 3 bowel movements a day are achieved, then as directed 04/02/20  Yes Emokpae, Courage, MD  levothyroxine (SYNTHROID, LEVOTHROID) 50 MCG tablet Take 50 mcg by mouth daily before breakfast.  07/25/14  Yes [provider]  NITROGLYCERIN PO Place 1 tablet under the tongue every 5 (five) minutes as needed (as directed, for chest pain).   Yes [provider]  ondansetron (ZOFRAN ODT) 4 MG disintegrating tablet Take 1 tablet (4 mg total) by mouth every 8 (eight) hours as needed. Patient taking differently: Take 4 mg by mouth See admin instructions. Dissolve 4 mg orally one to two times a day 05/09/20  Yes Ripley Fraise, MD  Oxycodone HCl 10 MG TABS Take 10 mg by mouth every 6 (six) hours. 03/06/20  Yes [provider]  OXYGEN Inhale 2-4 L/min into the lungs as needed (for shortness of breath).   Yes [provider]  potassium chloride SA (KLOR-CON) 20 MEQ tablet Take 1 tablet (20 mEq total) by mouth 2 (two) times daily. 04/02/20  Yes Emokpae, Courage, MD  Vitamin D, Ergocalciferol, (DRISDOL) 1.25 MG (50000 UNIT) CAPS capsule Take 1 capsule by  mouth once a week. 06/29/20  Yes [provider]  XIFAXAN 550 MG TABS tablet Take 550 mg by mouth 2 (two) times daily. 05/28/20  Yes [provider]  lipase/protease/amylase (CREON) 12000-38000 units CPEP capsule Take 1 capsule (12,000 Units total) by mouth 3 (three) times daily before meals. Patient not taking: No sig reported 04/02/20   Roxan Hockey, MD  megestrol (MEGACE) 40 MG tablet Take 1 tablet (40 mg total) by mouth daily. For Cachexia Patient not taking: No sig reported 10/11/19   Lindell Spar I, NP  mirtazapine (REMERON) 15 MG tablet Take 1 tablet (15 mg total) by mouth at bedtime. For depression Patient not taking: No sig reported 10/10/19   Lindell Spar  I, NP  lisinopril (ZESTRIL) 10 MG tablet Take 10 mg by mouth daily. Patient not taking: Reported on 10/02/2019 03/29/19 10/02/19  [provider]  metFORMIN (GLUCOPHAGE) 1000 MG tablet Take 1,000 mg by mouth 2 (two) times daily with a meal.  Patient not taking: Reported on 10/02/2019  10/02/19  [provider]  metoCLOPramide (REGLAN) 10 MG denies nausea or vomiting.  Generally, states she eats well tablet Take 10 mg by mouth 4 (four) times daily.  Patient not taking: Reported on 10/02/2019  10/02/19  [provider]  sertraline (ZOLOFT) 100 MG tablet Take 150 mg by mouth daily. Patient not taking: Reported on 10/02/2019 09/10/16 10/02/19  [provider]  spironolactone (ALDACTONE) 50 MG tablet Take 50 mg by mouth daily. Patient not taking: Reported on 10/02/2019 03/27/19 10/02/19  [provider]  traZODone (DESYREL) 100 MG tablet Take 1 tablet by mouth daily. Patient not taking: Reported on 10/02/2019 09/15/16 10/02/19  [provider]    Current Facility-Administered Medications  Medication Dose Route Frequency Provider Last Rate Last Admin  . (feeding supplement) PROSource Plus liquid 30 mL  30 mL Oral BID BM Shah, Pratik D, DO   30 mL at 07/26/20 0914  . acetylcysteine (MUCOMYST) 20 % nebulizer / oral solution 1 mL  1 mL Nebulization BID Manuella Ghazi, Pratik D, DO   1 mL at 07/29/20 0750  . albuterol (PROVENTIL) (2.5 MG/3ML) 0.083% nebulizer solution 2.5 mg  2.5 mg Inhalation Q6H PRN Kathie Dike, MD   2.5 mg at 07/28/20 0154  . ALPRAZolam Duanne Moron) tablet 0.5 mg  0.5 mg Oral TID PRN Manuella Ghazi, Pratik D, DO   0.5 mg at 07/28/20 0901  . Chlorhexidine Gluconate Cloth 2 % PADS 6 each  6 each Topical Q0600 Kathie Dike, MD   6 each at 07/29/20 [phone]  . feeding supplement (ENSURE ENLIVE / ENSURE PLUS) liquid 237 mL  237 mL Oral BID BM Kathie Dike, MD   237 mL at 07/27/20 1500  . furosemide (LASIX) injection 40 mg  40 mg Intravenous Q12H Shah, Pratik D, DO   40 mg  at 07/28/20 2124  . guaiFENesin-dextromethorphan (ROBITUSSIN DM) 100-10 MG/5ML syrup 5 mL  5 mL Oral Q4H PRN Manuella Ghazi, Pratik D, DO   5 mL at 07/21/20 1152  . ibuprofen (ADVIL) tablet 400 mg  400 mg Oral Q6H PRN Zierle-Ghosh, Asia B, DO   400 mg at 07/21/20 1152  . insulin aspart (novoLOG) injection 0-15 Units  0-15 Units Subcutaneous TID WC Shah, Pratik D, DO   5 Units at 07/28/20 1342  . insulin aspart (novoLOG) injection 0-5 Units  0-5 Units Subcutaneous QHS Heath Lark D, DO   3 Units at 07/25/20 2239  . insulin aspart (novoLOG) injection 3 Units  3 Units  Subcutaneous TID WC Shah, Pratik D, DO   3 Units at 07/28/20 0900  . insulin detemir (LEVEMIR) injection 8 Units  8 Units Subcutaneous BID Johnson, Clanford L, MD      . ipratropium-albuterol (DUONEB) 0.5-2.5 (3) MG/3ML nebulizer solution 3 mL  3 mL Nebulization TID Manuella Ghazi, Pratik D, DO   3 mL at 07/29/20 0749  . levothyroxine (SYNTHROID) tablet 50 mcg  50 mcg Oral QAC breakfast Kathie Dike, MD   50 mcg at 07/28/20 0517  . lipase/protease/amylase (CREON) capsule 24,000 Units  24,000 Units Oral TID York Spaniel, MD   24,000 Units at 07/28/20 0901  . magnesium sulfate IVPB 4 g 100 mL  4 g Intravenous Once Johnson, Clanford L, MD      . MEDLINE mouth rinse  15 mL Mouth Rinse BID Manuella Ghazi, Pratik D, DO   15 mL at 07/28/20 1315  . multivitamin with minerals tablet 1 tablet  1 tablet Oral Daily Manuella Ghazi, Pratik D, DO   1 tablet at 07/27/20 5366  . ondansetron (ZOFRAN) tablet 4 mg  4 mg Oral Q6H PRN Kathie Dike, MD       Or  . ondansetron (ZOFRAN) injection 4 mg  4 mg Intravenous Q6H PRN Kathie Dike, MD   4 mg at 07/20/20 1747  . oxyCODONE (Oxy IR/ROXICODONE) immediate release tablet 5 mg  5 mg Oral Q6H PRN Manuella Ghazi, Pratik D, DO   5 mg at 07/28/20 0608  . piperacillin-tazobactam (ZOSYN) IVPB 3.375 g  3.375 g Intravenous Q8H Coffee, Safia Christen, RPH 12.5 mL/hr at 07/29/20 0620 3.375 g at 07/29/20 0620  . rifaximin (XIFAXAN) tablet 550 mg  550 mg Oral  BID Kathie Dike, MD   550 mg at 07/27/20 2209  . sodium chloride (OCEAN) 0.65 % nasal spray 1 spray  1 spray Each Nare PRN Manuella Ghazi, Pratik D, DO   1 spray at 07/21/20 1152  . vancomycin (VANCOREADY) IVPB 1250 mg/250 mL  1,250 mg Intravenous Q24H Heath Lark D, DO 166.7 mL/hr at 07/28/20 2235 1,250 mg at 07/28/20 2235    Allergies as of 07/19/2020 - Review Complete 07/19/2020  Allergen Reaction Noted  . Acetaminophen Hives and Other (See Comments) 08/10/2012  . Cephalexin Itching, Swelling, and Other (See Comments) 12/25/2014  . Ciprofloxacin Hives 08/10/2012  . Hydromorphone Hives 01/17/2014  . Sulfamethoxazole-trimethoprim Shortness Of Breath, Swelling, and Other (See Comments) 08/10/2012  . Abilify [aripiprazole]  06/06/2020    No family history on file.  Social History   Socioeconomic History  . Marital status: Single    Spouse name: Not on file  . Number of children: Not on file  . Years of education: Not on file  . Highest education level: Not on file  Occupational History  . Not on file  Tobacco Use  . Smoking status: Never Smoker  . Smokeless tobacco: Never Used  Vaping Use  . Vaping Use: Never used  Substance and Sexual Activity  . Alcohol use: No  . Drug use: No  . Sexual activity: Not Currently  Other Topics Concern  . Not on file  Social History Narrative  . Not on file   Social Determinants of Health   Financial Resource Strain: Not on file  Food Insecurity: Not on file  Transportation Needs: Not on file  Physical Activity: Not on file  Stress: Not on file  Social Connections: Not on file  Intimate Partner Violence: Not on file    Review of Systems: Gen: Denies fever, chills, lightheadedness, syncope. CV:  Denies chest pain or palpitations. Resp: See HPI GI: See HPI MS: Denies joint pain Derm: Denies rash Psych: Denies depression or anxiety.  Heme: See HPI  Physical Exam: Vital signs in last 24 hours: Temp:  [97.3 F (36.3 C)-98.9 F (37.2  C)] 97.6 F (36.4 C) (05/11 0735) Pulse Rate:  [73-95] 77 (05/11 0800) Resp:  [20-33] 22 (05/11 0800) BP: (96-156)/(32-61) 111/32 (05/11 0800) SpO2:  [83 %-100 %] 95 % (05/11 0800) Weight:  [60.3 kg-63.7 kg] 60.3 kg (05/11 0416) Last BM Date: 07/28/20 General:   Alert,  Well-developed, well-nourished, pleasant and cooperative in NAD. Jaundiced.  Head:  Normocephalic and atraumatic. Eyes:  + scleral icterus. Ears:  Normal auditory acuity. Lungs:  Clear throughout to auscultation. No acute distress. [place] in place on 7L.  Heart:  Regular rate and rhythm; no murmurs, clicks, rubs,  or gallops. Abdomen:  Soft and nondistended.  Mild tenderness palpation across the upper abdomen, worse in the epigastric and LUQ region.  Minimal TTP across the lower abdomen. No masses, hepatosplenomegaly or hernias noted. Normal bowel sounds, without guarding, and without rebound.   Rectal:  Deferred  Msk:  Symmetrical without gross deformities. Normal posture. Extremities:  Without edema. Neurologic:  Alert and  oriented x4;  grossly normal neurologically.  No asterixis. Skin:  Intact without significant lesions or rashes. Psych:  Normal mood and affect.  Intake/Output from previous day: 05/10 0701 - 05/11 0700 In: 708.2 [IV Piggyback:708.2] Out: 1750 [Urine:1750] Intake/Output this shift: No intake/output data recorded.  Lab Results: Recent Labs    07/27/20 0513 07/28/20 0558 07/29/20 0424  WBC 5.8 8.9 8.4  HGB 7.5* 8.4* 7.7*  HCT 23.5* 26.8* 24.8*  PLT 55* 59* 51*   BMET Recent Labs    07/27/20 0513 07/28/20 0558 07/29/20 0424  NA 138 137 138  K 3.5 3.7 3.5  CL 107 106 105  CO2 27 26 28   GLUCOSE 86 124* 70  BUN 20 13 12   CREATININE 0.33* <0.30* 0.36*  CALCIUM 7.6* 7.9* 7.9*   LFT Recent Labs    07/27/20 0513 07/29/20 0424  PROT 4.4* 4.7*  ALBUMIN 2.0* 1.9*  AST 55* 58*  ALT 21 22  ALKPHOS 57 59  BILITOT 9.4* 9.4*   PT/INR Recent Labs    07/28/20 0558  LABPROT 22.8*   INR 2.0*   Studies/Results: DG Chest Port 1 View  Result Date: 07/28/2020 CLINICAL DATA:  Shortness of breath EXAM: PORTABLE CHEST 1 VIEW COMPARISON:  Jul 25, 2020 FINDINGS: Central catheter tip in superior vena cava. No appreciable pneumothorax. There is a left pleural effusion with consolidation in the left lower lobe, new from most recent chest radiograph. Extensive airspace opacity in each upper lobe is stable. New patchy airspace opacity noted in right base. Heart is upper normal in size with pulmonary vascularity normal. No adenopathy. No bone lesions. IMPRESSION: Central catheter as described without appreciable pneumothorax. New infiltrate left base with small left pleural effusion. Extensive upper lobe airspace opacity bilaterally essentially stable. New patchy airspace opacity right base. Appearance consistent with multifocal pneumonia, progressed from most recent study. Stable cardiac silhouette. Electronically Signed   By: Lowella Grip III M.D.   On: 07/28/2020 11:14    Impression: 55 y.o. year old female with medical history significant ofcirrhosis suspected primarily NASH and somewhat a previous history of alcohol use, insulin-dependent diabetes on insulin pump, chronic pancreatitis, thrombocytopenia (usually in the 60s), and hypothyroidism who was admitted on 5/1 for altered mental status, sepsis secondary  to E. coli UTI and multifocal apical pneumonia. CT head negative on admission.  CT A/P with contrast did not reveal a source of infection except for some gas bubbles noted in the pelvic floor, nonspecific.  There was low-density wall thickening in the cecum and apparent wall thickening and potential edema in proximal small bowel loops with considerations including infectious/inflammatory etiology versus secondary to hypoproteinemia.  Also with marked dilation of extrahepatic bile ducts s/p cholecystectomy similar to prior studies. Ammonia slightly elevated at 46 on admission. She  was clinically improving with broad-spectrum IV antibiotics, diuretics, lactulose, and xifaxan.  Xanax and gabapentin were also discontinued with improving mentation, later resumed due to concern for benzo withdrawal.  On 5/10, she developed worsening mentation, lethargy, some combativeness, and required increased O2 requirements yesterday.  Repeat chest x-ray with some worsening multifocal PNA in bibasilar regions.  Concern for aspiration.  She was restarted on Lasix, IV antibiotics, and order for SLP placed.  GI was consulted due to decompensated cirrhosis.   Hyperbilirubinemia: Chronic hyperbilirubinemia as outpatient up to 5.1 on 06/05/20.  Bilirubin 6.6 on admission.  Increased to 10.6 on 5/8, back down to 9.4 on 5/9 and remains at 9.4 today.  Notably, indirect bilirubin has previously been higher than direct bilirubin. LFTs stable with slight elevation of AST.  Chronic extrahepatic biliary dilation on CT this admission s/p cholecystectomy, stable.  Admits to chronic mild postprandial upper abdominal pain in the setting of chronic pancreatitis without change, on Creon.  On exam, she has mild TTP across the entire upper abdomen which she also reports is chronic and unchanged. Denies nausea or vomiting, BRBPR, or melena.  Acute on chronic hyperbilirubinemia likely multifactorial in the setting of sepsis, cirrhosis, and possibly component of Gilbert's disease.  Will fractionate bilirubin and obtain RUQ [place] to re-evaluate her bile ducts.   Cirrhosis: Suspected to be secondary to NASH though with some history of alcohol use in the past, complicated by thrombocytopenia and hepatic encephalopathy on lactulose and Xifaxan at home. Follows with [facility] liver clinic who previously recommended against liver transplant considering other comorbidities.  MELD 23 today, primarily driven by hyperbilirubinemia and elevated INR.  INR has been within baseline fluctuations this admission.  2.0 yesterday.  We will recheck today.   Abdomen remains soft and nondistended.  No lower extremity edema.  Altered mental status:  Much improved today, alert and oriented x4.  Likely secondary to acute illness with sepsis in the setting of UTI and multifocal pneumonia.  Also likely with component of hepatic encephalopathy.  Mildly elevated ammonia at 56 today. She has not been on lactulose since 5/6 due to some diarrhea while taking lactulose.  Discussed with nursing staff today who reports no bowel movement has been documented since 5/2.  Patient denies having bowel movements.  Will resuming lactulose 30g twice daily.   Abnormal CT small bowel: Likely secondary to cirrhosis with hypoalbuminemia.  She does not have symptoms to suggest colitis at this time.  She has also been on broad-spectrum antibiotics since admission.    Plan: 1.  RUQ ultrasound today. 2.  Fractionate bilirubin, update INR. 3.  Resume lactulose 30 mg twice daily. 4.  Creon 48,000 units with meals and 24,000 units with snacks. 5.  Management of sepsis/multifocal pneumonia per hospitalist.     LOS: 10 days    07/29/2020, 8:43 AM   Aliene Altes, PA-C [facility]

## 2020-07-29 NOTE — Progress Notes (Signed)
NAME:  [name], MRN:  [id number], DOB:  [birth year], LOS: 91 ADMISSION DATE:  07/19/2020, CONSULTATION DATE:  07/29/2020  REFERRING MD:  Manuella Ghazi TR[facility], CHIEF COMPLAINT: Hypoxia, increased oxygen requirements  History of Present Illness:  [name] is a 55 year old with nonalcoholic cirrhosis and multiple prior episodes of hepatic encephalopathy was admitted on 5/1 for altered mental status and fever.  CT imaging of head, chest abdomen and pelvis did not reveal any source of infection except for some gas bubbles noted in the pelvic floor.  Ammonia was normal, she was given broad-spectrum antibiotics-aztreonam, Flagyl and vancomycin, ceftriaxone and lactulose was continued. She was found to have E. coli UTI, pansensitive On 5/3 she developed bilateral infiltrates and increased hypoxia requiring up to 8 L oxygen , she was diuresed for pulmonary edema , Xanax was reintroduced for anxiety and panic attacks .  Her mental status continued to fluctuate over the next few days, gabapentin and Xanax were initially discontinued but then due to increased agitation and concern for benzodiazepine withdrawal, this was resumed .  Antibiotics which had been narrowed down to Augmentin was broadened again to Zosyn and vancomycin on 5/7. On 5/9 she was noted to require 6 L oxygen and was able to ambulate with PT On 5/10 she had increased oxygen requirements and was transferred to stepdown and PCCM consulted  Pertinent  Medical History  Cirrhosis, NASH , not a transplant candidate , history of repeated admissions for hepatic encephalopathy Chronic pancreatitis on replacement therapy IDDM on insulin pump Thrombocytopenia Depression,?  Schizoaffective disorder Anxiety on Xanax Chronic pain on oxycodone  Significant Hospital Events: Including procedures, antibiotic start and stop dates in addition to other pertinent events   . Echo 07/25/2020 normal LVEF, RVSP 35, mild AS  Interim History / Subjective:   Awake and  alert. Oxygen needs were as high as 14 L but titrated down this morning to 7 L Bedside swallow evaluation in progress  Objective   Blood pressure (!) 111/32, pulse 77, temperature 97.6 F (36.4 C), temperature source Oral, resp. rate (!) 22, height 5' 2"  (1.575 m), weight 60.3 kg, SpO2 95 %.        Intake/Output Summary (Last 24 hours) at 07/29/2020 0845 Last data filed at 07/29/2020 0400 Gross per 24 hour  Intake 708.24 ml  Output 1450 ml  Net -741.76 ml   Filed Weights   07/28/20 0500 07/28/20 1110 07/29/20 0416  Weight: 63.7 kg 63.7 kg 60.3 kg    Examination: General: Chronically ill-appearing woman sitting up in bed, no distress HENT: Mild pallor, icterus ++, no JVD Lungs: No accessory muscle use, clear breath sounds bilateral, no rhonchi Cardiovascular: S1-S2 regular, ejection systolic murmur 2/6 at base Abdomen: Soft, nontender, no guarding, no organomegaly Extremities: No edema, no deformity Neuro: Awake, interactive, no asterixis     Labs/imaging that I havepersonally reviewed  (right click and "Reselect all SmartList Selections" daily)  CT angiogram chest/abdomen/pelvis 5/1 >> mild mediastinal lymphadenopathy, cirrhosis with changes of portal hypertension , thickening of small bowel and cecum?  Low protein, multiple tiny gas bubbles in the left pelvic floor?  Etiology  Serial chest x-rays independently reviewed which Cherina bilateral upper lobe airspace disease which first appeared on 5/3 and a stable since. New airspace disease noted right base and left base  Labs 5/11 show mild hypokalemia and hypomagnesemia, no leukocytosis, stable anemia and thrombocytopenia Resolved Hospital Problem list     Assessment & Plan:  Acute on chronic hypoxic respiratory failure -Bilateral infiltrates  that seem to have developed after admission - Infiltrates first appear on 5/3 and worsen on 5/10 .  I would favor aspiration as the most likely cause in the setting of fluctuating  mental status rather than acute lung injury  -Not a great candidate for BiPAP -We were able to dial down to 7 L high flow nasal cannula at bedside -Agree with empiric HCAP coverage with Zosyn 5/7 >>   Acute decompensated liver failure -bilirubin is increased from 5.2-9.4   E. coli UTI -appears to have been treated adequately, procalcitonin is decreased to normal ?  Significance of gas bubbles noted on CT abdomen on admission, no evidence of acute abdomen or viscus perforation on exam  Acute encephalopathy -related to hypoxia and liver failure non focal exam, head CT negative on admit - Agree with lower doses of benzodiazepines and narcotics to prevent withdrawal with hold orders for lethargy   - DNR status  Best practice (right click and "Reselect all SmartList Selections" daily)  Diet:  NPO Pain/Anxiety/Delirium protocol (if indicated): Yes (RASS goal 0) VAP protocol (if indicated): Not indicated DVT prophylaxis: SCD GI prophylaxis: PPI Glucose control:  SSI Yes Central venous access:  Yes, and it is still needed Arterial line:  N/A Foley:  N/A Mobility:  bed rest  PT consulted: Yes Last date of multidisciplinary goals of care discussion [per TRH] Code Status:  DNR ,  Disposition: ICU  Labs   CBC: Recent Labs  Lab 07/25/20 0705 07/26/20 0618 07/27/20 0513 07/28/20 0558 07/29/20 0424  WBC 7.3 7.1 5.8 8.9 8.4  HGB 9.4* 7.9* 7.5* 8.4* 7.7*  HCT 29.4* 24.6* 23.5* 26.8* 24.8*  MCV 107.3* 107.4* 109.3* 110.3* 111.2*  PLT 57* 61* 55* 59* 51*    Basic Metabolic Panel: Recent Labs  Lab 07/25/20 0705 07/26/20 0618 07/27/20 0513 07/28/20 0558 07/29/20 0424  NA 145 139 138 137 138  K 2.9* 2.4* 3.5 3.7 3.5  CL 109 105 107 106 105  CO2 29 30 27 26 28   GLUCOSE 78 97 86 124* 70  BUN 28* 24* 20 13 12   CREATININE <0.30* 0.35* 0.33* <0.30* 0.36*  CALCIUM 8.8* 7.7* 7.6* 7.9* 7.9*  MG 1.9 1.8 1.9 1.7 1.5*   GFR: Estimated Creatinine Clearance: 66.4 mL/min (A) (by  C-G formula based on SCr of 0.36 mg/dL (L)). Recent Labs  Lab 07/25/20 0705 07/26/20 0618 07/27/20 0513 07/28/20 0558 07/29/20 0424  PROCALCITON 1.26 0.60 0.43  --   --   WBC 7.3 7.1 5.8 8.9 8.4    Liver Function Tests: Recent Labs  Lab 07/23/20 0634 07/24/20 0635 07/26/20 0618 07/27/20 0513 07/29/20 0424  AST 38 53* 59* 55* 58*  ALT 17 19 18 21 22   ALKPHOS 72 77 62 57 59  BILITOT 5.2* 7.8* 10.6* 9.4* 9.4*  PROT 5.0* 5.4* 4.5* 4.4* 4.7*  ALBUMIN 2.4* 2.6* 2.2* 2.0* 1.9*   No results for input(s): LIPASE, AMYLASE in the last 168 hours. Recent Labs  Lab 07/23/20 1323 07/26/20 0618 07/29/20 0424  AMMONIA 47* 13 56*    ABG    Component Value Date/Time   PHART 7.494 (H) 07/25/2020 1355   PCO2ART 35.9 07/25/2020 1355   PO2ART 69.9 (L) 07/25/2020 1355   HCO3 28.2 (H) 07/25/2020 1355   TCO2 26 05/09/2020 0427   O2SAT 95.3 07/25/2020 1355     Coagulation Profile: Recent Labs  Lab 07/28/20 0558  INR 2.0*       Kara Mead MD. FCCP. [facility] Pager : [phone]  If no response to pager , please call [phone] until 7 pm After 7:00 pm call Elink  [phone]   07/29/2020

## 2020-07-29 NOTE — Progress Notes (Signed)
PROGRESS NOTE   [name]  FXO:[id number] DOB: 08-27-1965 DOA: 07/19/2020 [facility]CP: [facility] [place]   Chief Complaint  Patient presents with  . Altered Mental Status   Level of care: Stepdown  Brief Admission History:  55 year old female with nonalcoholic cirrhosis, chronic recurrent hepatic encephalopathy, decompensated liver disease presented with altered mentation and fever.  Assessment & Plan:   Active Problems:   Thrombocytopenia ([place])   Alcoholic cirrhosis of liver without ascites (HCC)   Essential hypertension   Acquired hypothyroidism   Type 2 diabetes mellitus ([place])   Coagulopathy ([place])   Altered mental status   Sepsis ([place])   Acute lower UTI   DNR (do not resuscitate)   Pancreatic insufficiency   Malnutrition of moderate degree   Pressure injury of skin   Hepatic encephalopathy -I worry that she is developing decompensated liver disease that is not responding to therapy - with marked rise in bilirubin and ammonia I have asked for inpatient GI consultation.  Further recommendations to follow.   Hyperbilirubinemia - requesting inpatient GI consultation.    Acute respiratory failure with hypoxia secondary to multifocal/aspiration pneumonia - Continue IV antibiotics and supportive measures for now. PT remains DNR/DNI.  PCCM consultation appreciated.    Hypomagnesemia - IV replacement given, recheck in AM.   Thrombocytopenia - due to chronic liver disease/cirrhosis - stable, no active bleeding.   Anemia of chronic disease- Following Hg, currently stable.   Hypothryoidism - resumed home levothyroxine.   Chronic pancreatitis - stable on home Creon.   Type 2 DM with hypoglycemia - reduced levemir to 8 units.  Follow CBG closely.     DVT prophylaxis: SCD Code Status: DNR  Family Communication:   Disposition: TBD Status is: Inpatient  Remains inpatient appropriate because:Persistent severe electrolyte disturbances, Altered mental status, IV  treatments appropriate due to intensity of illness or inability to take PO and Inpatient level of care appropriate due to severity of illness   Dispo: The patient is from: Home              Anticipated d/c is to: Home              Patient currently is not medically stable to d/c.   Difficult to place patient No   Consultants:   GI   PCCM   Procedures:     Antimicrobials:  Zosyn 5/7>>> Vancomycin 5/10>  Subjective: Pt reportedly has been less confused today but not back to baseline.  Biliurubin remains high.   Objective: Vitals:   07/29/20 0800 07/29/20 0900 07/29/20 1000 07/29/20 1122  BP: (!) 111/32 (!) 117/52 (!) 134/52   Pulse: 77 79 80 81  Resp: (!) 22 (!) 32 (!) 29 (!) 28  Temp:    98.7 F (37.1 C)  TempSrc:    Oral  SpO2: 95% 93% 94% 95%  Weight:      Height:        Intake/Output Summary (Last 24 hours) at 07/29/2020 1130 Last data filed at 07/29/2020 0918 Gross per 24 hour  Intake 708.24 ml  Output 1450 ml  Net -741.76 ml   Filed Weights   07/28/20 0500 07/28/20 1110 07/29/20 0416  Weight: 63.7 kg 63.7 kg 60.3 kg    Examination:  General exam: jaundiced, chronically ill appearing, emaciated, Appears calm and comfortable  Respiratory system: Clear to auscultation. Respiratory effort normal. Cardiovascular system: normal S1 & S2 heard. No JVD, murmurs, rubs, gallops or clicks. No pedal edema. Gastrointestinal system: Abdomen is mildly  distended, soft and nontender. No organomegaly or masses felt. Normal bowel sounds heard. Central nervous system: Alert and oriented to person. No focal neurological deficits. Extremities: Symmetric 5 x 5 power. Skin: No rashes, lesions or ulcers Psychiatry: Judgement and insight appear poor. Mood & affect appropriate.   Data Reviewed: I have personally reviewed following labs and imaging studies  CBC: Recent Labs  Lab 07/25/20 0705 07/26/20 0618 07/27/20 0513 07/28/20 0558 07/29/20 0424  WBC 7.3 7.1 5.8 8.9  8.4  HGB 9.4* 7.9* 7.5* 8.4* 7.7*  HCT 29.4* 24.6* 23.5* 26.8* 24.8*  MCV 107.3* 107.4* 109.3* 110.3* 111.2*  PLT 57* 61* 55* 59* 51*    Basic Metabolic Panel: Recent Labs  Lab 07/25/20 0705 07/26/20 0618 07/27/20 0513 07/28/20 0558 07/29/20 0424  NA 145 139 138 137 138  K 2.9* 2.4* 3.5 3.7 3.5  CL 109 105 107 106 105  CO2 29 30 27 26 28   GLUCOSE 78 97 86 124* 70  BUN 28* 24* 20 13 12   CREATININE <0.30* 0.35* 0.33* <0.30* 0.36*  CALCIUM 8.8* 7.7* 7.6* 7.9* 7.9*  MG 1.9 1.8 1.9 1.7 1.5*    GFR: Estimated Creatinine Clearance: 66.4 mL/min (A) (by C-G formula based on SCr of 0.36 mg/dL (L)).  Liver Function Tests: Recent Labs  Lab 07/23/20 1638 07/24/20 0635 07/26/20 0618 07/27/20 0513 07/29/20 0424 07/29/20 1007  AST 38 53* 59* 55* 58*  --   ALT 17 19 18 21 22   --   ALKPHOS 72 77 62 57 59  --   BILITOT 5.2* 7.8* 10.6* 9.4* 9.4* 10.3*  PROT 5.0* 5.4* 4.5* 4.4* 4.7*  --   ALBUMIN 2.4* 2.6* 2.2* 2.0* 1.9*  --     CBG: Recent Labs  Lab 07/28/20 1629 07/28/20 2114 07/28/20 2139 07/28/20 2255 07/29/20 0737  GLUCAP 106* 48* 160* 115* 86    Recent Results (from the past 240 hour(s))  MRSA PCR Screening     Status: Abnormal   Collection Time: 07/19/20  5:18 PM   Specimen: Nasopharyngeal  Result Value Ref Range Status   MRSA by PCR POSITIVE (A) NEGATIVE Final    Comment:        The GeneXpert MRSA Assay (FDA approved for NASAL specimens only), is one component of a comprehensive MRSA colonization surveillance program. It is not intended to diagnose MRSA infection nor to guide or monitor treatment for MRSA infections. RESULT CALLED TO, READ BACK BY AND VERIFIED WITH: LEDWELL, L@1145  by [email or web address],b 5.2.22 Performed at [facility], [address], [place], [place] [address]   Gastrointestinal Panel by PCR , Stool     Status: None   Collection Time: 07/24/20  4:51 PM   Specimen: Stool  Result Value Ref Range Status   Campylobacter species NOT DETECTED  NOT DETECTED Final   Plesimonas shigelloides NOT DETECTED NOT DETECTED Final   Salmonella species NOT DETECTED NOT DETECTED Final   Yersinia enterocolitica NOT DETECTED NOT DETECTED Final   Vibrio species NOT DETECTED NOT DETECTED Final   Vibrio cholerae NOT DETECTED NOT DETECTED Final   Enteroaggregative E coli (EAEC) NOT DETECTED NOT DETECTED Final   Enteropathogenic E coli (EPEC) NOT DETECTED NOT DETECTED Final   Enterotoxigenic E coli (ETEC) NOT DETECTED NOT DETECTED Final   Shiga like toxin producing E coli (STEC) NOT DETECTED NOT DETECTED Final   Shigella/Enteroinvasive E coli (EIEC) NOT DETECTED NOT DETECTED Final   Cryptosporidium NOT DETECTED NOT DETECTED Final   Cyclospora cayetanensis NOT DETECTED NOT DETECTED Final  Entamoeba histolytica NOT DETECTED NOT DETECTED Final   Giardia lamblia NOT DETECTED NOT DETECTED Final   Adenovirus F40/41 NOT DETECTED NOT DETECTED Final   Astrovirus NOT DETECTED NOT DETECTED Final   Norovirus GI/GII NOT DETECTED NOT DETECTED Final   Rotavirus A NOT DETECTED NOT DETECTED Final   Sapovirus (I, II, IV, and V) NOT DETECTED NOT DETECTED Final    Comment: Performed at [facility], [address], [place], [place] [address]  C Difficile Quick Screen (NO PCR Reflex)     Status: None   Collection Time: 07/24/20  4:51 PM   Specimen: Stool  Result Value Ref Range Status   C Diff antigen NEGATIVE NEGATIVE Final   C Diff toxin NEGATIVE NEGATIVE Final   C Diff interpretation No C. difficile detected.  Final    Comment: Performed at [facility], [address], [place], [place] [address]     Radiology Studies: [facility] Chest [facility] 1 View  Result Date: 07/28/2020 CLINICAL DATA:  Shortness of breath EXAM: PORTABLE CHEST 1 VIEW COMPARISON:  Jul 25, 2020 FINDINGS: Central catheter tip in superior vena cava. No appreciable pneumothorax. There is a left pleural effusion with consolidation in the left lower lobe, new from most recent chest radiograph.  Extensive airspace opacity in each upper lobe is stable. New patchy airspace opacity noted in right base. Heart is upper normal in size with pulmonary vascularity normal. No adenopathy. No bone lesions. IMPRESSION: Central catheter as described without appreciable pneumothorax. New infiltrate left base with small left pleural effusion. Extensive upper lobe airspace opacity bilaterally essentially stable. New patchy airspace opacity right base. Appearance consistent with multifocal pneumonia, progressed from most recent study. Stable cardiac silhouette. Electronically Signed   By: Lowella Grip III M.D.   On: 07/28/2020 11:14    Scheduled Meds: . (feeding supplement) PROSource Plus  30 mL Oral BID BM  . acetylcysteine  1 mL Nebulization BID  . Chlorhexidine Gluconate Cloth  6 each Topical Q0600  . feeding supplement  237 mL Oral BID BM  . furosemide  40 mg Intravenous Q12H  . insulin aspart  0-15 Units Subcutaneous TID WC  . insulin aspart  0-5 Units Subcutaneous QHS  . insulin aspart  3 Units Subcutaneous TID WC  . insulin detemir  8 Units Subcutaneous BID  . ipratropium-albuterol  3 mL Nebulization TID  . lactulose  30 g Oral BID  . levothyroxine  50 mcg Oral QAC breakfast  . lipase/protease/amylase  24,000 Units Oral TID AC  . mouth rinse  15 mL Mouth Rinse BID  . multivitamin with minerals  1 tablet Oral Daily  . rifaximin  550 mg Oral BID   Continuous Infusions: . magnesium sulfate bolus IVPB 4 g (07/29/20 1027)  . piperacillin-tazobactam (ZOSYN)  IV 3.375 g (07/29/20 0620)  . vancomycin Stopped (07/29/20 0000)     LOS: 10 days   Time spent:40 mins   Irwin Brakeman, MD How to contact the [facility] Attending or Consulting provider [place] or covering provider during after hours [place], for this patient?  1. Check the care team in [facility] and look for a) attending/consulting [facility] provider listed and b) the [facility] team listed 2. Log into [email or web address] and use [place]'s universal  password to access. If you do not have the password, please contact the hospital operator. 3. Locate the [facility] provider you are looking for under [facility] and page to a number that you can be directly reached. 4. If  you still have difficulty reaching the provider, please page the [facility] (Director on Call) for the Hospitalists listed on amion for assistance.  07/29/2020, 11:30 AM

## 2020-07-29 NOTE — Plan of Care (Signed)
  Problem: Education: Goal: Knowledge of General Education information will improve Description: Including pain rating scale, medication(s)/side effects and non-pharmacologic comfort measures Outcome: Progressing   Problem: Clinical Measurements: Goal: Diagnostic test results will improve Outcome: Progressing Goal: Respiratory complications will improve Outcome: Progressing Goal: Cardiovascular complication will be avoided Outcome: Progressing   Problem: Activity: Goal: Risk for activity intolerance will decrease Outcome: Progressing   Problem: Elimination: Goal: Will not experience complications related to urinary retention Outcome: Progressing   Problem: Pain Managment: Goal: General experience of comfort will improve Outcome: Progressing   Problem: Safety: Goal: Ability to remain free from injury will improve Outcome: Progressing   Problem: Skin Integrity: Goal: Risk for impaired skin integrity will decrease Outcome: Progressing   Problem: Safety: Goal: Non-violent Restraint(s) Outcome: Progressing

## 2020-07-29 NOTE — Evaluation (Signed)
Clinical/Bedside Swallow Evaluation Patient Details  Name: [name] MRN: [id number] Date of Birth: 05/21/1965  Today's Date: 07/29/2020 Time: SLP Start Time (ACUTE ONLY): 7902 SLP Stop Time (ACUTE ONLY): 0845 SLP Time Calculation (min) (ACUTE ONLY): 23 min  Past Medical History:  Past Medical History:  Diagnosis Date  . Degenerative joint disease   . Diabetes mellitus without complication ([place])   . Gastroesophageal reflux   . Pancreatitis   . Panic attack   . Sleep apnea   . Thrombocytopenia ([place])    Past Surgical History:  Past Surgical History:  Procedure Laterality Date  . CHOLECYSTECTOMY    . SHOULDER SURGERY     left   . tongue sx. titanium screw placed to hold tongue down    . TOTAL THYROIDECTOMY     HPI:  [name] is a 55 y.o. female with medical history significant of cirrhosis, insulin-dependent diabetes on insulin pump, chronic pancreatitis, thrombocytopenia (usually in the 60s), and hypothyroidism who was admitted on 5/1 for hepatic encephalopathy. See HPI and H&P for details. On admission, pt continued to have confusion and noted new-onset neck pain.  She was admitted with sepsis secondary to E. coli UTI without findings of bacteria, but now appears to have worsening mentation with potential sepsis related to multifocal pneumonia.  Her antibiotic coverage has been broadened to vancomycin and Zosyn with improvement noted.  Pt had been steadily improving and on 5/9 we anticipated d/c, but overnight 5/9 to 5/10 pt became increasingly confused, disoriented, and was repeatedly stating she "needed to go home". In the AM she was delirious, had increased O2 requirements, was only oriented to self and place, and became somewhat combative with staff while continually ripping off [place]. Bedside O2 sats dropped to upper 50s to mid-80s depending on whether she had the [place] in place. Due to increased O2 requirements and worsening AMS, pt was transferred to stepdown and PCCM  consulted. BSE completed on 07/26/20 with recommendation for D3/thin. New BSE requested due to suspected aspiration and moved to ICU.   Assessment / Plan / Recommendation Clinical Impression  Repeat clinical swallow evaluation completed due to Pt with change in respiratory status upstairs and suspected aspiration during periods of AMS. Today, Pt is alert, coherent, oriented, and cooperative. She self presented cup/straw sips thin liquids, container of applesauce, and a package of graham crackers without signs or symptoms of aspiration no reports of globus sensation. Pt appears to be at very low risk for aspiration when alert. There was some question as to AMS while upstairs due to medication effect, which may have increased likelihood of aspiration event. SLP discussed with Dr. Elsworth Soho. Will resume D3/mech soft and thin liquids with aspiration and reflux precautions and po only when alert and upright, preferably while sitting up in chair. Pt does indicate that she has reflux and has a hospital bed at home and sleeps with HOB elevated. Pt expresses that she wants to go home and has an event in [place] (gospel concert) on Monday that she hopes to be home for. SLP will sign off. Can complete MBSS if occult process is suspected, however risk for aspiration at this time appears low.  SLP Visit Diagnosis: Dysphagia, unspecified (R13.10)    Aspiration Risk  Mild aspiration risk    Diet Recommendation Dysphagia 3 (Mech soft);Thin liquid   Liquid Administration via: Cup;Straw Medication Administration: Whole meds with liquid Supervision: Patient able to self feed Postural Changes: Seated upright at 90 degrees;Remain upright for at least  30 minutes after po intake    Other  Recommendations Oral Care Recommendations: Oral care BID Other Recommendations: Clarify dietary restrictions   Follow up Recommendations None      Frequency and Duration            Prognosis Prognosis for Safe Diet Advancement:  Good Barriers to Reach Goals: Medication Barriers/Prognosis Comment: consider AMS with medication effects impacting safe swallow      Swallow Study   General Date of Onset: 07/28/20 HPI: [name] is a 55 y.o. female with medical history significant of cirrhosis, insulin-dependent diabetes on insulin pump, chronic pancreatitis, thrombocytopenia (usually in the 60s), and hypothyroidism who was admitted on 5/1 for hepatic encephalopathy. See HPI and H&P for details. On admission, pt continued to have confusion and noted new-onset neck pain.  She was admitted with sepsis secondary to E. coli UTI without findings of bacteria, but now appears to have worsening mentation with potential sepsis related to multifocal pneumonia.  Her antibiotic coverage has been broadened to vancomycin and Zosyn with improvement noted.  Pt had been steadily improving and on 5/9 we anticipated d/c, but overnight 5/9 to 5/10 pt became increasingly confused, disoriented, and was repeatedly stating she "needed to go home". In the AM she was delirious, had increased O2 requirements, was only oriented to self and place, and became somewhat combative with staff while continually ripping off [place]. Bedside O2 sats dropped to upper 50s to mid-80s depending on whether she had the  in place. Due to increased O2 requirements and worsening AMS, pt was transferred to stepdown and PCCM consulted. BSE completed on 07/26/20 with recommendation for D3/thin. New BSE requested due to suspected aspiration and moved to ICU. Type of Study: Bedside Swallow Evaluation Previous Swallow Assessment: BSE 07/26/20 D3/thin Diet Prior to this Study: NPO Temperature Spikes Noted: No Respiratory Status: Nasal cannula (6L, usually on 2-4 at home) History of Recent Intubation: No Behavior/Cognition: Alert;Cooperative;Pleasant mood Oral Cavity Assessment: Within Functional Limits Oral Care Completed by SLP: Recent completion by staff Oral Cavity - Dentition:  Edentulous Vision: Functional for self-feeding Self-Feeding Abilities: Able to feed self Patient Positioning: Upright in bed Baseline Vocal Quality: Normal Volitional Cough: Strong Volitional Swallow: Able to elicit    Oral/Motor/Sensory Function Overall Oral Motor/Sensory Function: Within functional limits   Ice Chips Ice chips: Within functional limits Presentation: Spoon   Thin Liquid Thin Liquid: Within functional limits Presentation: Cup;Self Fed;Straw    Nectar Thick Nectar Thick Liquid: Not tested   Honey Thick Honey Thick Liquid: Not tested   Puree Puree: Within functional limits Presentation: Self Fed;Spoon   Solid     Solid: Within functional limits Presentation: Self Fed     Thank you,  Genene Churn, [place]  [place] 07/29/2020,9:13 AM

## 2020-07-30 DIAGNOSIS — K7460 Unspecified cirrhosis of liver: Secondary | ICD-10-CM

## 2020-07-30 DIAGNOSIS — J9601 Acute respiratory failure with hypoxia: Secondary | ICD-10-CM | POA: Diagnosis not present

## 2020-07-30 DIAGNOSIS — R188 Other ascites: Secondary | ICD-10-CM

## 2020-07-30 DIAGNOSIS — T17908A Unspecified foreign body in respiratory tract, part unspecified causing other injury, initial encounter: Secondary | ICD-10-CM

## 2020-07-30 DIAGNOSIS — R0902 Hypoxemia: Secondary | ICD-10-CM

## 2020-07-30 DIAGNOSIS — D649 Anemia, unspecified: Secondary | ICD-10-CM

## 2020-07-30 DIAGNOSIS — D696 Thrombocytopenia, unspecified: Secondary | ICD-10-CM | POA: Diagnosis not present

## 2020-07-30 DIAGNOSIS — E872 Acidosis: Secondary | ICD-10-CM | POA: Diagnosis not present

## 2020-07-30 DIAGNOSIS — R6520 Severe sepsis without septic shock: Secondary | ICD-10-CM

## 2020-07-30 DIAGNOSIS — A419 Sepsis, unspecified organism: Secondary | ICD-10-CM | POA: Diagnosis not present

## 2020-07-30 LAB — COMPREHENSIVE METABOLIC PANEL
ALT: 21 U/L (ref 0–44)
AST: 53 U/L — ABNORMAL HIGH (ref 15–41)
Albumin: 1.7 g/dL — ABNORMAL LOW (ref 3.5–5.0)
Alkaline Phosphatase: 67 U/L (ref 38–126)
Anion gap: 4 — ABNORMAL LOW (ref 5–15)
BUN: 10 mg/dL (ref 6–20)
CO2: 27 mmol/L (ref 22–32)
Calcium: 7.1 mg/dL — ABNORMAL LOW (ref 8.9–10.3)
Chloride: 104 mmol/L (ref 98–111)
Creatinine, Ser: 0.36 mg/dL — ABNORMAL LOW (ref 0.44–1.00)
GFR, Estimated: >60 mL/min (ref >60)
Glucose, Bld: 151 mg/dL — ABNORMAL HIGH (ref 70–99)
Potassium: 2.6 mmol/L — CL (ref 3.5–5.1)
Sodium: 135 mmol/L (ref 135–145)
Total Bilirubin: 7.6 mg/dL — ABNORMAL HIGH (ref 0.3–1.2)
Total Protein: 4.4 g/dL — ABNORMAL LOW (ref 6.5–8.1)

## 2020-07-30 LAB — CBC WITH DIFFERENTIAL/PLATELET
Abs Immature Granulocytes: 0.2 10*3/uL — ABNORMAL HIGH (ref 0.00–0.07)
Basophils Absolute: 0 10*3/uL (ref 0.0–0.1)
Basophils Relative: 0 %
Eosinophils Absolute: 0.1 10*3/uL (ref 0.0–0.5)
Eosinophils Relative: 2 %
HCT: 21.5 % — ABNORMAL LOW (ref 36.0–46.0)
Hemoglobin: 6.9 g/dL — CL (ref 12.0–15.0)
Immature Granulocytes: 3 %
Lymphocytes Relative: 13 %
Lymphs Abs: 0.9 10*3/uL (ref 0.7–4.0)
MCH: 35.9 pg — ABNORMAL HIGH (ref 26.0–34.0)
MCHC: 32.1 g/dL (ref 30.0–36.0)
MCV: 112 fL — ABNORMAL HIGH (ref 80.0–100.0)
Monocytes Absolute: 0.6 10*3/uL (ref 0.1–1.0)
Monocytes Relative: 9 %
Neutro Abs: 5.4 10*3/uL (ref 1.7–7.7)
Neutrophils Relative %: 73 %
Platelets: 49 10*3/uL — ABNORMAL LOW (ref 150–400)
RBC: 1.92 MIL/uL — ABNORMAL LOW (ref 3.87–5.11)
RDW: 19.2 % — ABNORMAL HIGH (ref 11.5–15.5)
WBC: 7.3 10*3/uL (ref 4.0–10.5)
nRBC: 0.3 % — ABNORMAL HIGH (ref 0.0–0.2)

## 2020-07-30 LAB — GLUCOSE, CAPILLARY
Glucose-Capillary: 214 mg/dL — ABNORMAL HIGH (ref 70–99)
Glucose-Capillary: 99 mg/dL (ref 70–99)
Glucose-Capillary: 214 mg/dL — ABNORMAL HIGH (ref 70–99)
Glucose-Capillary: 189 mg/dL — ABNORMAL HIGH (ref 70–99)
Glucose-Capillary: 184 mg/dL — ABNORMAL HIGH (ref 70–99)

## 2020-07-30 LAB — HEMOGLOBIN AND HEMATOCRIT, BLOOD
HCT: 27.1 % — ABNORMAL LOW (ref 36.0–46.0)
Hemoglobin: 9 g/dL — ABNORMAL LOW (ref 12.0–15.0)

## 2020-07-30 LAB — PREPARE RBC (CROSSMATCH)

## 2020-07-30 LAB — PROTIME-INR
INR: 2.1 — ABNORMAL HIGH (ref 0.8–1.2)
Prothrombin Time: 23.4 seconds — ABNORMAL HIGH (ref 11.4–15.2)

## 2020-07-30 LAB — AMMONIA: Ammonia: 39 umol/L — ABNORMAL HIGH (ref 9–35)

## 2020-07-30 LAB — ABO/RH: ABO/RH(D): A POS

## 2020-07-30 LAB — MAGNESIUM: Magnesium: 1.6 mg/dL — ABNORMAL LOW (ref 1.7–2.4)

## 2020-07-30 MED ORDER — POTASSIUM CHLORIDE CRYS ER 20 MEQ PO TBCR
40.0000 meq | EXTENDED_RELEASE_TABLET | ORAL | Status: AC
  Administered 2020-07-30 (×2): 40 meq via ORAL
  Filled 2020-07-30: qty 2

## 2020-07-30 MED ORDER — ALPRAZOLAM 0.25 MG PO TABS
0.2500 mg | ORAL_TABLET | Freq: Three times a day (TID) | ORAL | Status: DC | PRN
  Administered 2020-07-30: 0.25 mg via ORAL
  Filled 2020-07-30: qty 1

## 2020-07-30 MED ORDER — FUROSEMIDE 20 MG PO TABS
20.0000 mg | ORAL_TABLET | Freq: Two times a day (BID) | ORAL | Status: DC
  Administered 2020-07-30 – 2020-07-31 (×3): 20 mg via ORAL
  Filled 2020-07-30 (×3): qty 1

## 2020-07-30 MED ORDER — MAGNESIUM SULFATE 2 GM/50ML IV SOLN
2.0000 g | Freq: Once | INTRAVENOUS | Status: AC
  Administered 2020-07-30: 2 g via INTRAVENOUS
  Filled 2020-07-30: qty 50

## 2020-07-30 MED ORDER — SODIUM CHLORIDE 0.9% IV SOLUTION: Freq: Once | INTRAVENOUS | Status: AC

## 2020-07-30 NOTE — Progress Notes (Signed)
Nutrition Follow-up  DOCUMENTATION CODES:   Non-severe (moderate) malnutrition in context of chronic illness  INTERVENTION:   -Continue Ensure Enlive po BID, each supplement provides 350 kcal and 20 grams of protein -Continue 30 ml Prosource Plus BID, each supplement provides 100 kcals and 15 grams protein -Magic cup TID with meals, each supplement provides 290 kcal and 9 grams of protein  NUTRITION DIAGNOSIS:   Moderate Malnutrition related to chronic illness (alchololic cirrhosis, pancreatitis) as evidenced by mild muscle depletion,mild fat depletion,energy intake < 75% for > or equal to 1 month.  Ongoing  GOAL:   Patient will meet greater than or equal to 90% of their needs  Progressing   MONITOR:   PO intake,Supplement acceptance,Labs,I & O's,Weight trends,Skin  REASON FOR ASSESSMENT:   Malnutrition Screening Tool    ASSESSMENT:   Patient is a chronically ill 55 yo. Hx of DM2, Alcoholic Cirrhosis, Pancreatitis and chronic nausea. She presents with altered mental status and acute UTI.  5/8- s/p BSE- advanced to dysphagia 3 diet with thin liquids 5/10- transferred to ICU secondary to increased O2 requirements 5/11- s/p BSE- advanced to regular consistency diet  Reviewed I/O's: -1.4 and +10.9 L since admission  UOP: 2.3 L x 24 hours  Pt agitated at time of visit, noted yelling "help me". Pt on HFNC, plan to wean down to 4L/m or less.   Pt with improved oral intake; noted meal completion 80-100%. Pt intermittently accepting of Ensure Enlive supplements, but is consistently taking Prosource Plus.   Medications reviewed and include lasix, creon, lactulose, and potassium chloride.  Labs reviewed: K: 2.6 (on PO supplementation), CBGS: 99-214 (inpatient orders for glycemic control are 0-15 units insulin aspart TID with meals, 0-5 units insulin aspart daily at bedtime, 3 units insulin aspart TID with meals, and 8 units insulin detemir BID).   Diet Order:   Diet Order             Diet heart healthy/carb modified Room service appropriate? Yes; Fluid consistency: Thin  Diet effective now                 EDUCATION NEEDS:   Education needs have been addressed  Skin:  Skin Assessment: Skin Integrity Issues: Skin Integrity Issues:: Stage II Stage II: buttock  Last BM:  07/22/20  Height:   Ht Readings from Last 1 Encounters:  07/28/20 5' 2"  (1.575 m)    Weight:   Wt Readings from Last 1 Encounters:  07/29/20 60.3 kg   BMI:  Body mass index is 24.31 kg/m.  Estimated Nutritional Needs:   Kcal:  2000-2200  Protein:  115-130 grams  Fluid:  1.5 L    Loistine Chance, RD, LDN, [place] Registered Dietitian II Certified Diabetes Care and Education Specialist Please refer to [facility] for RD and/or RD on-call/weekend/after hours pager

## 2020-07-30 NOTE — Progress Notes (Signed)
Patient ambulated around ICU hallway x1 on 4L via [place] and tolerated well. o2 saturation remained greater than 91% with no reports of SOB

## 2020-07-30 NOTE — Progress Notes (Signed)
NAME:  [name], MRN:  [id number], DOB:  08-08-1965, LOS: 8 ADMISSION DATE:  07/19/2020, CONSULTATION DATE:  07/30/2020  REFERRING MD:  Lanice Shirts, CHIEF COMPLAINT: Hypoxia, increased oxygen requirements  History of Present Illness:  [name] is a 55 year old with nonalcoholic cirrhosis and multiple prior episodes of hepatic encephalopathy was admitted on 5/1 for altered mental status and fever.  CT imaging of head, chest abdomen and pelvis did not reveal any source of infection except for some gas bubbles noted in the pelvic floor.  Ammonia was normal, she was given broad-spectrum antibiotics-aztreonam, Flagyl and vancomycin, ceftriaxone and lactulose was continued. She was found to have E. coli UTI, pansensitive On 5/3 she developed bilateral infiltrates and increased hypoxia requiring up to 8 L oxygen , she was diuresed for pulmonary edema , Xanax was reintroduced for anxiety and panic attacks .  Her mental status continued to fluctuate over the next few days, gabapentin and Xanax were initially discontinued but then due to increased agitation and concern for benzodiazepine withdrawal, this was resumed .  Antibiotics which had been narrowed down to Augmentin was broadened again to Zosyn and vancomycin on 5/7. On 5/9 she was noted to require 6 L oxygen and was able to ambulate with PT On 5/10 she had increased oxygen requirements and was transferred to stepdown and PCCM consulted  Pertinent  Medical History  Cirrhosis, NASH , not a transplant candidate , history of repeated admissions for hepatic encephalopathy Chronic pancreatitis on replacement therapy IDDM on insulin pump Thrombocytopenia Depression,?  Schizoaffective disorder Anxiety on Xanax Chronic pain on oxycodone  Significant Hospital Events: Including procedures, antibiotic start and stop dates in addition to other pertinent events   . Echo 07/25/2020 normal LVEF, RVSP 35, mild AS . 5/11 down to 7 L oxygen, clear to swallow  eval  Interim History / Subjective:   Sitting up in bed, having breakfast. On 8 L nasal cannula, no distress Fianc at bedside Afebrile  Objective   Blood pressure 131/69, pulse 82, temperature 98.1 F (36.7 C), temperature source Oral, resp. rate (!) 29, height 5' 2"  (1.575 m), weight 60.3 kg, SpO2 90 %.        Intake/Output Summary (Last 24 hours) at 07/30/2020 0836 Last data filed at 07/30/2020 0645 Gross per 24 hour  Intake 824.17 ml  Output 2250 ml  Net -1425.83 ml   Filed Weights   07/28/20 0500 07/28/20 1110 07/29/20 0416  Weight: 63.7 kg 63.7 kg 60.3 kg    Examination: General: Chronically ill-appearing woman sitting up in bed, no distress HENT: Mild pallor, icterus ++, no JVD Lungs: Clear breath sounds bilateral, no rhonchi, no accessory muscle use Cardiovascular: S1-S2 regular, ejection systolic murmur 2/6 at base Abdomen: Soft, nontender, no organomegaly Extremities: No edema, no deformity Neuro: Awake, interactive, nonfocal, no asterixis     Labs/imaging that I havepersonally reviewed  (right click and "Reselect all SmartList Selections" daily)  CT angiogram chest/abdomen/pelvis 5/1 >> mild mediastinal lymphadenopathy, cirrhosis with changes of portal hypertension , thickening of small bowel and cecum?  Low protein, multiple tiny gas bubbles in the left pelvic floor?  Etiology  Serial chest x-rays independently reviewed >> bilateral upper lobe airspace disease which first appeared on 5/3 and stable since. New airspace disease noted right base and left base  Labs 5/12 show hypokalemia, hypomagnesemia, no leukocytosis, worsening anemia, stable thrombocytopenia Resolved Hospital Problem list     Assessment & Plan:  Acute on chronic hypoxic respiratory failure -Bilateral infiltrates that seem  to have developed after admission - Infiltrates first appear on 5/3 and worsen on 5/10 .  I would favor aspiration as the most likely cause in the setting of  fluctuating mental status rather than acute lung injury  -Not a great candidate for BiPAP -Dial down FiO2 as saturation improves -Agree with empiric HCAP coverage with Zosyn 5/7 >> 5/14, vancomycinx 5 days should suffice   Acute decompensated liver failure -bilirubin  increased from 5.2-9.4, dropping back down Hemoglobin drifting down, will need 1 unit blood transfusion today, defer to primary No evidence of GI bleeding Macrocytosis -no history of significant EtOH, [name] to check B12 folate levels   E. coli UTI -appears to have been treated adequately, procalcitonin is decreased to normal ?  Significance of gas bubbles noted on CT abdomen on admission, no evidence of acute abdomen or viscus perforation on exam  Acute encephalopathy -related to hypoxia and liver failure , complicated by chronic benzodiazepine and opiate use non focal exam, head CT negative on admit - Agree with lower doses of benzodiazepines and narcotics to prevent withdrawal with hold orders for lethargy  Hypokalemia and hypomagnesemia will be repleted`  - DNR status  Best practice (right click and "Reselect all SmartList Selections" daily)  Diet:  NPO Pain/Anxiety/Delirium protocol (if indicated): Yes (RASS goal 0) VAP protocol (if indicated): Not indicated DVT prophylaxis: SCD GI prophylaxis: PPI Glucose control:  SSI Yes Central venous access:  Yes, and it is still needed Arterial line:  N/A Foley:  N/A Mobility:  bed rest  PT consulted: Yes Last date of multidisciplinary goals of care discussion [per TRH] Code Status:  DNR ,  Disposition: ICU  Labs   CBC: Recent Labs  Lab 07/26/20 0618 07/27/20 0513 07/28/20 0558 07/29/20 0424 07/30/20 0436  WBC 7.1 5.8 8.9 8.4 7.3  NEUTROABS  --   --   --   --  5.4  HGB 7.9* 7.5* 8.4* 7.7* 6.9*  HCT 24.6* 23.5* 26.8* 24.8* 21.5*  MCV 107.4* 109.3* 110.3* 111.2* 112.0*  PLT 61* 55* 59* 51* 49*    Basic Metabolic Panel: Recent Labs  Lab 07/26/20 0618  07/27/20 0513 07/28/20 0558 07/29/20 0424 07/30/20 0436  NA 139 138 137 138 135  K 2.4* 3.5 3.7 3.5 2.6*  CL 105 107 106 105 104  CO2 30 27 26 28 27   GLUCOSE 97 86 124* 70 151*  BUN 24* 20 13 12 10   CREATININE 0.35* 0.33* <0.30* 0.36* 0.36*  CALCIUM 7.7* 7.6* 7.9* 7.9* 7.1*  MG 1.8 1.9 1.7 1.5* 1.6*   GFR: Estimated Creatinine Clearance: 66.4 mL/min (A) (by C-G formula based on SCr of 0.36 mg/dL (L)). Recent Labs  Lab 07/25/20 0705 07/26/20 0618 07/27/20 0513 07/28/20 0558 07/29/20 0424 07/30/20 0436  PROCALCITON 1.26 0.60 0.43  --   --   --   WBC 7.3 7.1 5.8 8.9 8.4 7.3    Liver Function Tests: Recent Labs  Lab 07/24/20 0635 07/26/20 0618 07/27/20 0513 07/29/20 0424 07/29/20 1007 07/30/20 0436  AST 53* 59* 55* 58*  --  53*  ALT 19 18 21 22   --  21  ALKPHOS 77 62 57 59  --  67  BILITOT 7.8* 10.6* 9.4* 9.4* 10.3* 7.6*  PROT 5.4* 4.5* 4.4* 4.7*  --  4.4*  ALBUMIN 2.6* 2.2* 2.0* 1.9*  --  1.7*   No results for input(s): LIPASE, AMYLASE in the last 168 hours. Recent Labs  Lab 07/23/20 1323 07/26/20 0618 07/29/20 0424  AMMONIA  47* 13 56*    ABG    Component Value Date/Time   PHART 7.494 (H) 07/25/2020 1355   PCO2ART 35.9 07/25/2020 1355   PO2ART 69.9 (L) 07/25/2020 1355   HCO3 28.2 (H) 07/25/2020 1355   TCO2 26 05/09/2020 0427   O2SAT 95.3 07/25/2020 1355     Coagulation Profile: Recent Labs  Lab 07/28/20 0558 07/29/20 1006 07/30/20 0436  INR 2.0* 2.4* 2.1*       Kara Mead MD. Shade Flood. [place] Pager : [phone]  If no response to pager , please call [phone] until 7 pm After 7:00 pm call Elink  [phone]   07/30/2020

## 2020-07-30 NOTE — Progress Notes (Signed)
PROGRESS NOTE   [name]  IWL:[id number] DOB: [birth year] DOA: 07/19/2020 PCP: [facility]   Chief Complaint  Patient presents with  . Altered Mental Status   Level of care: Stepdown  Brief Admission History:  55 year old female with nonalcoholic cirrhosis, chronic recurrent hepatic encephalopathy, decompensated liver disease presented with altered mentation and fever.  Assessment & Plan:   Active Problems:   Thrombocytopenia ([place])   Alcoholic cirrhosis of liver without ascites (HCC)   Essential hypertension   Acquired hypothyroidism   Type 2 diabetes mellitus ([place])   Coagulopathy ([place])   Altered mental status   Sepsis ([place])   Acute lower UTI   DNR (do not resuscitate)   Pancreatic insufficiency   Malnutrition of moderate degree   Pressure injury of skin   Acute hypoxemic respiratory failure ([place])   Hepatic encephalopathy - improving. Appreciate GI consult and recommendations.  Ammonia down today.   Hyperbilirubinemia - total bilirubin down to 7.6 today.     Acute respiratory failure with hypoxia secondary to multifocal/aspiration pneumonia - Completed IV antibiotics.  Continue supportive measures for now. Pt remains DNR/DNI.  PCCM consultation appreciated.   Goal now is to [facility] oxygen down to goal of 4L/m or less.    Hypomagnesemia - IV replacement given and repleted.   Hypokalemia - oral replacement given.   Thrombocytopenia - due to chronic liver disease/cirrhosis - stable, no active bleeding.   Anemia of chronic disease- Hg down to 6.9.  Transfuse 1 unit PRBC today, recheck CBC in AM.    Hypothryoidism - resumed home levothyroxine.   Chronic pancreatitis - stable on home Creon.   Type 2 DM with hypoglycemia - reduced levemir to 8 units with no further hypoglycemia seen.  Follow CBG closely.     DVT prophylaxis: SCD Code Status: DNR  Family Communication:  Spouse/fiance at bedside  Disposition: anticipating home with [facility] Status is:  Inpatient  Remains inpatient appropriate because:Persistent severe electrolyte disturbances, Altered mental status, IV treatments appropriate due to intensity of illness or inability to take PO and Inpatient level of care appropriate due to severity of illness  Dispo: The patient is from: Home              Anticipated d/c is to: Home              Patient currently is not medically stable to d/c.   Difficult to place patient No  Consultants:   GI   PCCM   Procedures:     Antimicrobials:  Zosyn 5/7>>>5/12 Vancomycin 5/10>5/12  Subjective: Pt threatening to leave AMA today.  She remains on 8L/min [place].     Objective: Vitals:   07/30/20 0000 07/30/20 0400 07/30/20 0741 07/30/20 0810  BP:      Pulse:    82  Resp:    (!) 29  Temp: 98 F (36.7 C) 97.8 F (36.6 C)  98.1 F (36.7 C)  TempSrc: Oral   Oral  SpO2:   96% 90%  Weight:      Height:        Intake/Output Summary (Last 24 hours) at 07/30/2020 1116 Last data filed at 07/30/2020 0645 Gross per 24 hour  Intake 824.17 ml  Output 2250 ml  Net -1425.83 ml   Filed Weights   07/28/20 0500 07/28/20 1110 07/29/20 0416  Weight: 63.7 kg 63.7 kg 60.3 kg    Examination:  General exam: jaundiced, chronically ill appearing, emaciated, Appears calm and comfortable  Respiratory system: shallow breath  sounds bilaterally. Cardiovascular system: normal S1 & S2 heard. No JVD, murmurs, rubs, gallops or clicks. No pedal edema. Gastrointestinal system: Abdomen is mildly distended, soft and nontender. No organomegaly or masses felt. Normal bowel sounds heard. Central nervous system: Alert and oriented to person. No focal neurological deficits. Extremities: Symmetric 5 x 5 power. Skin: No rashes, lesions or ulcers Psychiatry: Judgement and insight appear poor. Mood & affect appropriate.   Data Reviewed: I have personally reviewed following labs and imaging studies  CBC: Recent Labs  Lab 07/26/20 0618 07/27/20 0513  07/28/20 0558 07/29/20 0424 07/30/20 0436  WBC 7.1 5.8 8.9 8.4 7.3  NEUTROABS  --   --   --   --  5.4  HGB 7.9* 7.5* 8.4* 7.7* 6.9*  HCT 24.6* 23.5* 26.8* 24.8* 21.5*  MCV 107.4* 109.3* 110.3* 111.2* 112.0*  PLT 61* 55* 59* 51* 49*    Basic Metabolic Panel: Recent Labs  Lab 07/26/20 0618 07/27/20 0513 07/28/20 0558 07/29/20 0424 07/30/20 0436  NA 139 138 137 138 135  K 2.4* 3.5 3.7 3.5 2.6*  CL 105 107 106 105 104  CO2 30 27 26 28 27   GLUCOSE 97 86 124* 70 151*  BUN 24* 20 13 12 10   CREATININE 0.35* 0.33* <0.30* 0.36* 0.36*  CALCIUM 7.7* 7.6* 7.9* 7.9* 7.1*  MG 1.8 1.9 1.7 1.5* 1.6*    GFR: Estimated Creatinine Clearance: 66.4 mL/min (A) (by C-G formula based on SCr of 0.36 mg/dL (L)).  Liver Function Tests: Recent Labs  Lab 07/24/20 2563 07/26/20 0618 07/27/20 0513 07/29/20 0424 07/29/20 1007 07/30/20 0436  AST 53* 59* 55* 58*  --  53*  ALT 19 18 21 22   --  21  ALKPHOS 77 62 57 59  --  67  BILITOT 7.8* 10.6* 9.4* 9.4* 10.3* 7.6*  PROT 5.4* 4.5* 4.4* 4.7*  --  4.4*  ALBUMIN 2.6* 2.2* 2.0* 1.9*  --  1.7*    CBG: Recent Labs  Lab 07/29/20 1122 07/29/20 1607 07/29/20 2133 07/30/20 0259 07/30/20 0810  GLUCAP 272* 185* 230* 214* 99    Recent Results (from the past 240 hour(s))  Gastrointestinal Panel by PCR , Stool     Status: None   Collection Time: 07/24/20  4:51 PM   Specimen: Stool  Result Value Ref Range Status   Campylobacter species NOT DETECTED NOT DETECTED Final   Plesimonas shigelloides NOT DETECTED NOT DETECTED Final   Salmonella species NOT DETECTED NOT DETECTED Final   Yersinia enterocolitica NOT DETECTED NOT DETECTED Final   Vibrio species NOT DETECTED NOT DETECTED Final   Vibrio cholerae NOT DETECTED NOT DETECTED Final   Enteroaggregative E coli (EAEC) NOT DETECTED NOT DETECTED Final   Enteropathogenic E coli (EPEC) NOT DETECTED NOT DETECTED Final   Enterotoxigenic E coli (ETEC) NOT DETECTED NOT DETECTED Final   Shiga like toxin  producing E coli (STEC) NOT DETECTED NOT DETECTED Final   Shigella/Enteroinvasive E coli (EIEC) NOT DETECTED NOT DETECTED Final   Cryptosporidium NOT DETECTED NOT DETECTED Final   Cyclospora cayetanensis NOT DETECTED NOT DETECTED Final   Entamoeba histolytica NOT DETECTED NOT DETECTED Final   Giardia lamblia NOT DETECTED NOT DETECTED Final   Adenovirus F40/41 NOT DETECTED NOT DETECTED Final   Astrovirus NOT DETECTED NOT DETECTED Final   Norovirus GI/GII NOT DETECTED NOT DETECTED Final   Rotavirus A NOT DETECTED NOT DETECTED Final   Sapovirus (I, II, IV, and V) NOT DETECTED NOT DETECTED Final    Comment: Performed at  [facility], [place], [place] [address]  C Difficile Quick Screen (NO PCR Reflex)     Status: None   Collection Time: 07/24/20  4:51 PM   Specimen: Stool  Result Value Ref Range Status   C Diff antigen NEGATIVE NEGATIVE Final   C Diff toxin NEGATIVE NEGATIVE Final   C Diff interpretation No C. difficile detected.  Final    Comment: Performed at [facility], [address] [place], [place] [address]     Radiology Studies: US Abdomen Limited RUQ (LIVER/GB)  Result Date: 07/29/2020 CLINICAL DATA:  Elevated liver function tests EXAM: ULTRASOUND ABDOMEN LIMITED RIGHT UPPER QUADRANT COMPARISON:  07/19/2020 CT FINDINGS: Gallbladder: Surgically absent Common bile duct: Diameter: Measures up to 10 mm today. On the order of 1.6 cm on the 07/19/2020 CT (when remeasured). No obstructive stone identified. Liver: Cirrhosis. No focal liver lesion. Cavernous transformation of the portal vein, as on recent CT. Other: Small right pleural effusion. IMPRESSION: 1. Cholecystectomy with biliary duct dilatation, as on recent CT. No cause identified. 2. 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 3. Small right pleural effusion. Electronically Signed   By: Abigail Miyamoto M.D.   On: 07/29/2020 13:52    Scheduled Meds: . (feeding supplement) PROSource Plus  30  mL Oral BID BM  . sodium chloride   Intravenous Once  . acetylcysteine  1 mL Nebulization BID  . Chlorhexidine Gluconate Cloth  6 each Topical Q0600  . feeding supplement  237 mL Oral BID BM  . furosemide  20 mg Oral BID  . insulin aspart  0-15 Units Subcutaneous TID WC  . insulin aspart  0-5 Units Subcutaneous QHS  . insulin aspart  3 Units Subcutaneous TID WC  . insulin detemir  8 Units Subcutaneous BID  . ipratropium-albuterol  3 mL Nebulization TID  . lactulose  30 g Oral BID  . levothyroxine  50 mcg Oral QAC breakfast  . lipase/protease/amylase  48,000 Units Oral TID WC   And  . lipase/protease/amylase  24,000 Units Oral With snacks  . mouth rinse  15 mL Mouth Rinse BID  . multivitamin with minerals  1 tablet Oral Daily  . potassium chloride  40 mEq Oral Q4H  . rifaximin  550 mg Oral BID   Continuous Infusions: . piperacillin-tazobactam (ZOSYN)  IV 3.375 g (07/30/20 0645)  . vancomycin      LOS: 11 days   Time spent: 37 mins   Skyylar Kopf Wynetta Emery, MD How to contact the [facility] Attending or Consulting provider [place] or covering provider during after hours [place], for this patient?  1. Check the care team in [facility] and look for a) attending/consulting [facility] provider listed and b) the [facility] team listed 2. Log into [email or web address] and use [place]'s universal password to access. If you do not have the password, please contact the hospital operator. 3. Locate the [facility] provider you are looking for under [facility] and page to a number that you can be directly reached. 4. If you still have difficulty reaching the provider, please page the [facility] (Director on Call) for the Hospitalists listed on amion for assistance.  07/30/2020, 11:16 AM

## 2020-07-30 NOTE — Progress Notes (Signed)
Patients continues to tolerate decrease in O2 delivery. Weaned down to 3L O2 [place]. Patient tolerated decreased O2 flow rate well with no complaints. No increased work of breathing or distress noted. Patient resting comfortable with O2 sats ranging from 94-96%.

## 2020-07-30 NOTE — Progress Notes (Signed)
Patient assisted back to bed from chair. Patient O2 sats mid 90's on 8L O2 [place]. O2 decreased to 5L [place] and sat probe assessed for placement. Patient currently sating at 95% with no increased work of breathing and no distress noted. Will continue to monitor and attempt further weaning to home O2 demands.

## 2020-07-30 NOTE — Progress Notes (Signed)
Patient refusing to take AM meds, she took 34mq of K+, and lasix po this am. But refused her creon and all other PO meds. MD notified. Patient education provided. Patient assisted up to chair.

## 2020-07-30 NOTE — Progress Notes (Addendum)
Report called to   RN on 300. Patient assisted to room 330 with all belongings accounted for.

## 2020-07-30 NOTE — Progress Notes (Addendum)
Subjective:  Resting.  Easily awakens.  Denies abdominal pain.  Ate half of a bacon lettuce tomato biscuit her son brought her.  She is aware of where she is and who she is.  According to patient's family, she wants to leave AMA.    Objective: Vital signs in last 24 hours: Temp:  [97.8 F (36.6 C)-99.2 F (37.3 C)] 98.1 F (36.7 C) (05/12 0810) Pulse Rate:  [79-84] 82 (05/12 0810) Resp:  [24-32] 29 (05/12 0810) BP: (117-134)/(42-69) 131/69 (05/11 1300) SpO2:  [90 %-96 %] 90 % (05/12 0810) Last BM Date: 07/28/20 General:   Chronically ill-appearing.  No acute distress.  Appears comfortable. Head:  Normocephalic and atraumatic. Eyes:  Sclera clear, + icterus.  Chest: CTA bilaterally without rales, rhonchi, crackles.    Heart:  Regular rate and rhythm; no murmurs, clicks, rubs,  or gallops. Abdomen:  Soft, nontender and nondistendedNormal bowel sounds, without guarding, and without rebound.   Extremities:  Without clubbing, deformity or edema. Neurologic:  Alert and  oriented to person and place.  Did not specifically ask about time as patient quickly falls back to sleep. Skin: Jaundiced  Intake/Output from previous day: 05/11 0701 - 05/12 0700 In: 824.2 [P.O.:720; IV Piggyback:104.2] Out: 2250 [Urine:2250] Intake/Output this shift: No intake/output data recorded.  Lab Results: CBC Recent Labs    07/28/20 0558 07/29/20 0424 07/30/20 0436  WBC 8.9 8.4 7.3  HGB 8.4* 7.7* 6.9*  HCT 26.8* 24.8* 21.5*  MCV 110.3* 111.2* 112.0*  PLT 59* 51* 49*   BMET Recent Labs    07/28/20 0558 07/29/20 0424 07/30/20 0436  NA 137 138 135  K 3.7 3.5 2.6*  CL 106 105 104  CO2 26 28 27   GLUCOSE 124* 70 151*  BUN 13 12 10   CREATININE <0.30* 0.36* 0.36*  CALCIUM 7.9* 7.9* 7.1*   LFTs Recent Labs    07/29/20 0424 07/29/20 1007 07/30/20 0436  BILITOT 9.4* 10.3* 7.6*  BILIDIR  --  4.9*  --   IBILI  --  5.4*  --   ALKPHOS 59  --  67  AST 58*  --  53*  ALT 22  --  21  PROT 4.7*   --  4.4*  ALBUMIN 1.9*  --  1.7*   No results for input(s): LIPASE in the last 72 hours. PT/INR Recent Labs    07/28/20 0558 07/29/20 1006 07/30/20 0436  LABPROT 22.8* 25.9* 23.4*  INR 2.0* 2.4* 2.1*      Imaging Studies: DG Chest 1 View  Result Date: 07/25/2020 CLINICAL DATA:  Acute hypoxia with respiratory failure.  Diabetes. EXAM: CHEST  1 VIEW COMPARISON:  07/21/2020 FINDINGS: Lungs are adequately inflated and demonstrate slight interval worsening bilateral airspace consolidation most prominent over the upper lobes. Findings most suggestive of multifocal infection. No evidence of effusion. Improvement to resolution of small effusions. Cardiomediastinal silhouette and remainder of the exam is unchanged. IMPRESSION: Slight interval worsening bilateral airspace process most prominent over the upper lobes likely multifocal infection. Electronically Signed   By: Marin Olp M.D.   On: 07/25/2020 10:40   CT Head Wo Contrast  Result Date: 07/19/2020 CLINICAL DATA:  Mental status change EXAM: CT HEAD WITHOUT CONTRAST TECHNIQUE: Contiguous axial images were obtained from the base of the skull through the vertex without intravenous contrast. COMPARISON:  02/29/2020 FINDINGS: Brain: There is no acute intracranial hemorrhage, mass effect, or edema. Gray-white differentiation is preserved. There is no extra-axial fluid collection. Ventricles and sulci are within normal limits  in size and configuration. Vascular: There is atherosclerotic calcification at the skull base. Skull: Calvarium is unremarkable. Sinuses/Orbits: No acute finding. Other: None. IMPRESSION: No acute intracranial abnormality. Electronically Signed   By: Macy Mis M.D.   On: 07/19/2020 12:54   CT Angio Chest PE W/Cm &/Or Wo Cm  Result Date: 07/19/2020 CLINICAL DATA:  Mental status changes.  Lethargy. EXAM: CT ANGIOGRAPHY CHEST CT ABDOMEN AND PELVIS WITH CONTRAST TECHNIQUE: Multidetector CT imaging of the chest was performed  using the standard protocol during bolus administration of intravenous contrast. Multiplanar CT image reconstructions and MIPs were obtained to evaluate the vascular anatomy. Multidetector CT imaging of the abdomen and pelvis was performed using the standard protocol during bolus administration of intravenous contrast. CONTRAST:  1106m OMNIPAQUE IOHEXOL 350 MG/ML SOLN COMPARISON:  Abdomen and pelvis CT 05/21/2020 FINDINGS: CTA CHEST FINDINGS Cardiovascular: The heart is enlarged. No substantial pericardial effusion. Coronary artery calcification is evident. Atherosclerotic calcification is noted in the wall of the thoracic aorta. There is no filling defect within the opacified pulmonary arteries to suggest the presence of an acute pulmonary embolus. Mediastinum/Nodes: Mild mediastinal lymphadenopathy shows slight progression. 13 mm short axis subcarinal node was 9 mm previously. 12 mm short axis right paratracheal node on 86/7 with somewhat obscured previously but measured about 8 mm. No left hilar lymphadenopathy. No substantial lymphadenopathy in the right hilum. The esophagus has normal imaging features. There is no axillary lymphadenopathy. Lungs/Pleura: Subsegmental atelectasis noted in the lungs bilaterally, basilar predominant. No overt pulmonary edema. No pleural effusion. No suspicious pulmonary nodule or mass. Musculoskeletal: No worrisome lytic or sclerotic osseous abnormality. Review of the MIP images confirms the above findings. CT ABDOMEN and PELVIS FINDINGS Hepatobiliary: Nodular hepatic contour again noted compatible with cirrhosis. Stable 8 mm subcapsular low-density lesion posterior right liver, too small to characterize but likely a cyst. Gallbladder is surgically absent with marked dilatation of the extrahepatic bile ducts. Common bile duct measures 14 mm diameter in the head of the pancreas, at the same level it measured 15 mm previously. Pancreas: No focal mass lesion. No dilatation of the main  duct. No intraparenchymal cyst. No peripancreatic edema. Spleen: 15.5 cm craniocaudal length, enlarged. Adrenals/Urinary Tract: No adrenal nodule or mass. Tiny hypodensities lower pole right kidney are too small to characterize but likely benign. Similar probably benign tiny hypodensity noted upper po no hydronephrosis No evidence for hydroureter. The urinary bladder appears normal for the degree of distention. Stomach/Bowel: Stomach is unremarkable. No gastric wall thickening. No evidence of outlet obstruction. Duodenum is normally positioned as is the ligament of Treitz. Jejunal loops have an appearance suggesting wall thickening but are nondilated. Terminal ileum unremarkable. Low-density wall thickening noted in the cecum. Colon is decompressed. Scattered diverticuli are seen in the left colon without diverticulitis. Vascular/Lymphatic: There is abdominal aortic atherosclerosis without aneurysm. 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 Marked venous collateralization noted in the left upper abdomen with features suggesting spleno renal shunt. There is no gastrohepatic or hepatoduodenal ligament lymphadenopathy. No retroperitoneal or mesenteric lymphadenopathy. No pelvic sidewall lymphadenopathy. Reproductive: The uterus is unremarkable.  There is no adnexal mass. Other: Mesenteric edema is associated with small volume free fluid in the pelvis. Multiple gas bubbles are seen in the extraperitoneal left pelvic floor, posterior to the bladder and adjacent to the vagina. These have a curvilinear/serpiginous character in some areas raising the question of gas within venous anatomy. No soft tissue or vascular gas elsewhere in  the pelvis. As above, there is no evidence of diverticulitis. Musculoskeletal: Diffuse body wall edema. No worrisome lytic or sclerotic osseous abnormality. Review of the MIP images confirms the above findings. IMPRESSION: 1. No CT evidence  for acute pulmonary embolus. 2. Mild mediastinal lymphadenopathy, potentially reactive. 3. 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 Splenomegaly with extensive collateralization noted in the left abdomen and spleno renal shunt anatomy is consistent with portal venous hypertension. 4. Low-density wall thickening in the cecum with apparent wall thickening and potential edema in proximal small bowel loops. Infectious/inflammatory etiology a consideration although this could also be seen in the setting of systemic disease (hypoproteinemia). 5. Multiple tiny gas bubbles are seen in the extraperitoneal left pelvic floor, posterior to the bladder and adjacent to the vagina. These have a curvilinear/serpiginous character in some areas raising the question of gas within venous anatomy. No soft tissue or vascular gas elsewhere in the pelvis. No findings to explain the presence of this gas. Follow-up could be used to ensure resolution as clinical picture dictates. 6. Marked dilatation of the extrahepatic bile ducts status post cholecystectomy. This may be related to the prior cholecystectomy and is similar to prior studies. Correlation with liver function test may prove helpful. 7. Diffuse body wall edema with small volume free fluid in the pelvis. 8. Aortic Atherosclerosis (ICD10-I70.0). Electronically Signed   By: Misty Stanley M.D.   On: 07/19/2020 13:17   CT Abdomen Pelvis W Contrast  Result Date: 07/19/2020 CLINICAL DATA:  Mental status changes.  Lethargy. EXAM: CT ANGIOGRAPHY CHEST CT ABDOMEN AND PELVIS WITH CONTRAST TECHNIQUE: Multidetector CT imaging of the chest was performed using the standard protocol during bolus administration of intravenous contrast. Multiplanar CT image reconstructions and MIPs were obtained to evaluate the vascular anatomy. Multidetector CT imaging of the abdomen and pelvis was performed using the standard protocol during bolus administration of intravenous  contrast. CONTRAST:  134m OMNIPAQUE IOHEXOL 350 MG/ML SOLN COMPARISON:  Abdomen and pelvis CT 05/21/2020 FINDINGS: CTA CHEST FINDINGS Cardiovascular: The heart is enlarged. No substantial pericardial effusion. Coronary artery calcification is evident. Atherosclerotic calcification is noted in the wall of the thoracic aorta. There is no filling defect within the opacified pulmonary arteries to suggest the presence of an acute pulmonary embolus. Mediastinum/Nodes: Mild mediastinal lymphadenopathy shows slight progression. 13 mm short axis subcarinal node was 9 mm previously. 12 mm short axis right paratracheal node on 86/7 with somewhat obscured previously but measured about 8 mm. No left hilar lymphadenopathy. No substantial lymphadenopathy in the right hilum. The esophagus has normal imaging features. There is no axillary lymphadenopathy. Lungs/Pleura: Subsegmental atelectasis noted in the lungs bilaterally, basilar predominant. No overt pulmonary edema. No pleural effusion. No suspicious pulmonary nodule or mass. Musculoskeletal: No worrisome lytic or sclerotic osseous abnormality. Review of the MIP images confirms the above findings. CT ABDOMEN and PELVIS FINDINGS Hepatobiliary: Nodular hepatic contour again noted compatible with cirrhosis. Stable 8 mm subcapsular low-density lesion posterior right liver, too small to characterize but likely a cyst. Gallbladder is surgically absent with marked dilatation of the extrahepatic bile ducts. Common bile duct measures 14 mm diameter in the head of the pancreas, at the same level it measured 15 mm previously. Pancreas: No focal mass lesion. No dilatation of the main duct. No intraparenchymal cyst. No peripancreatic edema. Spleen: 15.5 cm craniocaudal length, enlarged. Adrenals/Urinary Tract: No adrenal nodule or mass. Tiny hypodensities lower pole right kidney are too small to characterize but likely benign. Similar probably benign tiny  hypodensity noted upper po no  hydronephrosis No evidence for hydroureter. The urinary bladder appears normal for the degree of distention. Stomach/Bowel: Stomach is unremarkable. No gastric wall thickening. No evidence of outlet obstruction. Duodenum is normally positioned as is the ligament of Treitz. Jejunal loops have an appearance suggesting wall thickening but are nondilated. Terminal ileum unremarkable. Low-density wall thickening noted in the cecum. Colon is decompressed. Scattered diverticuli are seen in the left colon without diverticulitis. Vascular/Lymphatic: There is abdominal aortic atherosclerosis without aneurysm. 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 Marked venous collateralization noted in the left upper abdomen with features suggesting spleno renal shunt. There is no gastrohepatic or hepatoduodenal ligament lymphadenopathy. No retroperitoneal or mesenteric lymphadenopathy. No pelvic sidewall lymphadenopathy. Reproductive: The uterus is unremarkable.  There is no adnexal mass. Other: Mesenteric edema is associated with small volume free fluid in the pelvis. Multiple gas bubbles are seen in the extraperitoneal left pelvic floor, posterior to the bladder and adjacent to the vagina. These have a curvilinear/serpiginous character in some areas raising the question of gas within venous anatomy. No soft tissue or vascular gas elsewhere in the pelvis. As above, there is no evidence of diverticulitis. Musculoskeletal: Diffuse body wall edema. No worrisome lytic or sclerotic osseous abnormality. Review of the MIP images confirms the above findings. IMPRESSION: 1. No CT evidence for acute pulmonary embolus. 2. Mild mediastinal lymphadenopathy, potentially reactive. 3. 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 Splenomegaly with extensive collateralization noted in the left abdomen and spleno renal shunt anatomy is consistent with portal venous hypertension.  4. Low-density wall thickening in the cecum with apparent wall thickening and potential edema in proximal small bowel loops. Infectious/inflammatory etiology a consideration although this could also be seen in the setting of systemic disease (hypoproteinemia). 5. Multiple tiny gas bubbles are seen in the extraperitoneal left pelvic floor, posterior to the bladder and adjacent to the vagina. These have a curvilinear/serpiginous character in some areas raising the question of gas within venous anatomy. No soft tissue or vascular gas elsewhere in the pelvis. No findings to explain the presence of this gas. Follow-up could be used to ensure resolution as clinical picture dictates. 6. Marked dilatation of the extrahepatic bile ducts status post cholecystectomy. This may be related to the prior cholecystectomy and is similar to prior studies. Correlation with liver function test may prove helpful. 7. Diffuse body wall edema with small volume free fluid in the pelvis. 8. Aortic Atherosclerosis (ICD10-I70.0). Electronically Signed   By: Misty Stanley M.D.   On: 07/19/2020 13:17   DG Chest Port 1 View  Result Date: 07/28/2020 CLINICAL DATA:  Shortness of breath EXAM: PORTABLE CHEST 1 VIEW COMPARISON:  Jul 25, 2020 FINDINGS: Central catheter tip in superior vena cava. No appreciable pneumothorax. There is a left pleural effusion with consolidation in the left lower lobe, new from most recent chest radiograph. Extensive airspace opacity in each upper lobe is stable. New patchy airspace opacity noted in right base. Heart is upper normal in size with pulmonary vascularity normal. No adenopathy. No bone lesions. IMPRESSION: Central catheter as described without appreciable pneumothorax. New infiltrate left base with small left pleural effusion. Extensive upper lobe airspace opacity bilaterally essentially stable. New patchy airspace opacity right base. Appearance consistent with multifocal pneumonia, progressed from most  recent study. Stable cardiac silhouette. Electronically Signed   By: Lowella Grip III M.D.   On: 07/28/2020 11:14   DG CHEST PORT 1  VIEW  Result Date: 07/21/2020 CLINICAL DATA:  Hypoxia EXAM: PORTABLE CHEST 1 VIEW COMPARISON:  07/19/2020 FINDINGS: Since the prior examination, extensive bilateral slightly asymmetric diffuse pulmonary infiltrate has developed, most in keeping with acute infection or, given its relatively rapid development, asymmetric pulmonary edema. Probable tiny left pleural effusion. No pneumothorax. Cardiac size is mildly enlarged. No acute bone abnormality. IMPRESSION: Interval development of extensive bilateral slightly asymmetric pulmonary infiltrate and small left pleural effusion, most likely representing moderate cardiogenic failure given its relatively rapid development. Acute infection, however, could appear similarly. Electronically Signed   By: Fidela Salisbury MD   On: 07/21/2020 23:54   DG Chest Port 1 View  Result Date: 07/19/2020 CLINICAL DATA:  Possible sepsis, altered mental status EXAM: PORTABLE CHEST 1 VIEW COMPARISON:  07/12/2020 FINDINGS: Similar cardiomediastinal contours. Chronic interstitial prominence with pulmonary vascular congestion. No significant pleural effusion. No pneumothorax. IMPRESSION: Chronic interstitial prominence with vascular congestion. Electronically Signed   By: Macy Mis M.D.   On: 07/19/2020 11:12   ECHOCARDIOGRAM COMPLETE  Result Date: 07/25/2020    ECHOCARDIOGRAM REPORT   Patient Name:   FRANCY MCILVAINE Date of Exam: 07/25/2020 Medical Rec #:  [id number]         Height:       62.0 in Accession #:    [id number]        Weight:       134.3 lb Date of Birth:  04/24/1965         BSA:          1.614 m Patient Age:    58 years          BP:           138/65 mmHg Patient Gender: F                 HR:           100 bpm. Exam Location:  [facility] Procedure: 2D Echo, Cardiac Doppler and Color Doppler Indications:    Acute Respiratory Distress  R06.03  History:        Patient has no prior history of Echocardiogram examinations.                 Risk Factors:Diabetes, Sleep Apnea and GERD.  Sonographer:    Jonelle Sidle Dance Referring Phys: [id number] [place]  1. Left ventricular ejection fraction, by estimation, is 65 to 70%. The left ventricle has normal function. The left ventricle has no regional wall motion abnormalities. There is mild left ventricular hypertrophy. Indeterminate diastolic filling due to E-A fusion.  2. Right ventricular systolic function is normal. The right ventricular size is normal. There is normal pulmonary artery systolic pressure. The estimated right ventricular systolic pressure is 65.0 mmHg.  3. Left atrial size was severely dilated.  4. The mitral valve is normal in structure. No evidence of mitral valve regurgitation. No evidence of mitral stenosis.  5. The aortic valve is grossly normal. There is mild calcification of the aortic valve. Aortic valve regurgitation is not visualized. Mild to moderate aortic valve sclerosis/calcification is present, with borderline findings of aortic stenosis, if present it is mild.  6. Increased flow velocities may be secondary to anemia, thyrotoxicosis, hyperdynamic or high flow state.  7. The inferior vena cava is dilated in size with >50% respiratory variability, suggesting right atrial pressure of 8 mmHg. FINDINGS  Left Ventricle: Left ventricular ejection fraction, by estimation, is 65 to 70%. The left ventricle has normal function.  The left ventricle has no regional wall motion abnormalities. The left ventricular internal cavity size was normal in size. There is  mild left ventricular hypertrophy. Indeterminate diastolic filling due to E-A fusion. Right Ventricle: The right ventricular size is normal. No increase in right ventricular wall thickness. Right ventricular systolic function is normal. There is normal pulmonary artery systolic pressure. The tricuspid regurgitant  velocity is 2.59 m/s, and  with an assumed right atrial pressure of 8 mmHg, the estimated right ventricular systolic pressure is 29.5 mmHg. Left Atrium: Left atrial size was severely dilated. Right Atrium: Right atrial size was normal in size. Pericardium: There is no evidence of pericardial effusion. Mitral Valve: The mitral valve is normal in structure. Mild mitral annular calcification. No evidence of mitral valve regurgitation. No evidence of mitral valve stenosis. Tricuspid Valve: The tricuspid valve is normal in structure. Tricuspid valve regurgitation is trivial. No evidence of tricuspid stenosis. Aortic Valve: The aortic valve is grossly normal. There is mild calcification of the aortic valve. Aortic valve regurgitation is not visualized. Mild to moderate aortic valve sclerosis/calcification is present, without any evidence of aortic stenosis. Aortic valve mean gradient measures 14.0 mmHg. Aortic valve peak gradient measures 25.0 mmHg. Aortic valve area, by VTI measures 1.31 cm. Pulmonic Valve: The pulmonic valve was normal in structure. Pulmonic valve regurgitation is trivial. No evidence of pulmonic stenosis. Aorta: The aortic root is normal in size and structure. Venous: The inferior vena cava is dilated in size with greater than 50% respiratory variability, suggesting right atrial pressure of 8 mmHg. IAS/Shunts: The interatrial septum was not well visualized.  LEFT VENTRICLE PLAX 2D LVIDd:         5.01 cm  Diastology LVIDs:         3.27 cm  LV e' medial:    8.59 cm/s LV PW:         1.21 cm  LV E/e' medial:  17.8 LV IVS:        0.88 cm  LV e' lateral:   12.60 cm/s LVOT diam:     1.50 cm  LV E/e' lateral: 12.1 LV SV:         67 LV SV Index:   42 LVOT Area:     1.77 cm  RIGHT VENTRICLE RV Basal diam:  2.91 cm RV S prime:     22.70 cm/s TAPSE (M-mode): 3.0 cm LEFT ATRIUM             Index       RIGHT ATRIUM           Index LA diam:        4.70 cm 2.91 cm/m  RA Area:     14.60 cm LA Vol (A2C):   75.0 ml  46.47 ml/m RA Volume:   37.10 ml  22.99 ml/m LA Vol (A4C):   89.8 ml 55.64 ml/m LA Biplane Vol: 85.0 ml 52.67 ml/m  AORTIC VALVE AV Area (Vmax):    1.24 cm AV Area (Vmean):   1.26 cm AV Area (VTI):     1.31 cm AV Vmax:           250.00 cm/s AV Vmean:          175.000 cm/s AV VTI:            0.513 m AV Peak Grad:      25.0 mmHg AV Mean Grad:      14.0 mmHg LVOT Vmax:  176.00 cm/s LVOT Vmean:        125.000 cm/s LVOT VTI:          0.380 m LVOT/AV VTI ratio: 0.74  AORTA Ao Root diam: 2.70 cm Ao Asc diam:  2.70 cm MITRAL VALVE                TRICUSPID VALVE MV Area (PHT): 3.85 cm     TR Peak grad:   26.8 mmHg MV Decel Time: 197 msec     TR Vmax:        259.00 cm/s MV E velocity: 153.00 cm/s MV A velocity: 123.00 cm/s  SHUNTS MV E/A ratio:  1.24         Systemic VTI:  0.38 m                             Systemic Diam: 1.50 cm Cherlynn Kaiser MD Electronically signed by Cherlynn Kaiser MD Signature Date/Time: 07/25/2020/1:56:25 PM    Final    [place] EKG SITE RITE  Result Date: 07/26/2020 If Site Rite image not attached, placement could not be confirmed due to current cardiac rhythm.  US Abdomen Limited RUQ (LIVER/GB)  Result Date: 07/29/2020 CLINICAL DATA:  Elevated liver function tests EXAM: ULTRASOUND ABDOMEN LIMITED RIGHT UPPER QUADRANT COMPARISON:  07/19/2020 CT FINDINGS: Gallbladder: Surgically absent Common bile duct: Diameter: Measures up to 10 mm today. On the order of 1.6 cm on the 07/19/2020 CT (when remeasured). No obstructive stone identified. Liver: Cirrhosis. No focal liver lesion. Cavernous transformation of the portal vein, as on recent CT. Other: Small right pleural effusion. IMPRESSION: 1. Cholecystectomy with biliary duct dilatation, as on recent CT. No cause identified. 2. 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 3. Small right pleural effusion. Electronically Signed   By: Abigail Miyamoto M.D.   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 coli UTI and multifocal apical pneumonia.  CT negative on admission.  CT abdomen pelvis with contrast did not reveal a source of infection except for some gas bubbles noted in the pelvic floor, nonspecific.  Low-density wall thickening of the cecum and apparent wall thickening and potential edema and proximal small bowel loops with considerations including infectious/inflammatory etiology versus secondary to hypoproteinemia.  Also with marked dilation of extrahepatic bile duct status postcholecystectomy similar to prior studies.  Ammonia slightly elevated at 46 on admission.  She is clinically improving with broad-spectrum IV antibiotics, diuretics, lactulose, Xifaxan.  Xanax and gabapentin were also discontinued with improving mentation, later resumed due to concern for benzo withdrawal.  On May 10 she developed worsening mentation, lethargy, combativeness, requiring increased O2 requirements.  Repeat chest x-ray with some worsening multifocal pneumonia in bibasilar regions.  Concern for aspiration.  She was restarted on Lasix, IV antibiotics, speech therapy evaluation was placed (felt to be low risk for aspiration unless altered mental status).  GI consulted for decompensated cirrhosis.  Hyperbilirubinemia: Chronic finding, as outpatient up to 5.1 back in March.  Bilirubin 6.6 this admission.  Increased to 10.6 on 5/8, down to 7.6 today.  AST mildly elevated but stable.  Chronic extrahepatic biliary dilation on CT this admission status post cholecystectomy, stable.  Acute on chronic hyperbilirubinemia likely multifactorial in the setting of sepsis, cirrhosis.  Chronic epigastric pain in the setting of  chronic pancreatitis, unchanged. RUQ U/S yesterday with CBD of 10 mm nonobstructive stone, Transformation of the portal vein as on recent CT.  Cirrhosis.  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  Follows with [facility] liver clinic who previously recommended against liver transplant considering other comorbidities.  MELD 23 yesterday, primarily driven by elevated total bilirubin and INR.  Abdominal exam is unremarkable.  No lower extremity edema.  INR slightly improved with vitamin K yesterday.  Albumin 1.7. MELD 22 today.  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  Altered mental status: Improved.  Likely multifactorial in the setting of acute illness with sepsis, component of hepatic encephalopathy.  Abnormal CT small bowel: Likely secondary to cirrhosis with hypoalbuminemia.  Anemia: B12, folate, iron, ferritin all normal.  No overt GI bleeding.  Hemoglobin trickled down to 6.9 today.  1 unit of packed red blood cells planned for today.   Plan: 1. Management of hypokalemia, low magnesium per attending. 2. 1 unit of packed red blood cells planned today. 3. LFTs, INR tomorrow. 4. We will continue to follow with you.  Laureen Ochs. Bernarda Caffey [facility] [phone] 5/12/202211:45 AM     LOS: 11 days

## 2020-07-31 DIAGNOSIS — R17 Unspecified jaundice: Secondary | ICD-10-CM

## 2020-07-31 DIAGNOSIS — E8809 Other disorders of plasma-protein metabolism, not elsewhere classified: Secondary | ICD-10-CM

## 2020-07-31 LAB — COMPREHENSIVE METABOLIC PANEL
ALT: 24 U/L (ref 0–44)
AST: 61 U/L — ABNORMAL HIGH (ref 15–41)
Albumin: 1.8 g/dL — ABNORMAL LOW (ref 3.5–5.0)
Alkaline Phosphatase: 80 U/L (ref 38–126)
Anion gap: 8 (ref 5–15)
BUN: 10 mg/dL (ref 6–20)
CO2: 28 mmol/L (ref 22–32)
Calcium: 7.6 mg/dL — ABNORMAL LOW (ref 8.9–10.3)
Chloride: 98 mmol/L (ref 98–111)
Creatinine, Ser: 0.32 mg/dL — ABNORMAL LOW (ref 0.44–1.00)
GFR, Estimated: >60 mL/min (ref >60)
Glucose, Bld: 151 mg/dL — ABNORMAL HIGH (ref 70–99)
Potassium: 2.9 mmol/L — ABNORMAL LOW (ref 3.5–5.1)
Sodium: 134 mmol/L — ABNORMAL LOW (ref 135–145)
Total Bilirubin: 7.7 mg/dL — ABNORMAL HIGH (ref 0.3–1.2)
Total Protein: 5 g/dL — ABNORMAL LOW (ref 6.5–8.1)

## 2020-07-31 LAB — CBC WITH DIFFERENTIAL/PLATELET
Abs Immature Granulocytes: 0.18 10*3/uL — ABNORMAL HIGH (ref 0.00–0.07)
Basophils Absolute: 0.1 10*3/uL (ref 0.0–0.1)
Basophils Relative: 1 %
Eosinophils Absolute: 0.2 10*3/uL (ref 0.0–0.5)
Eosinophils Relative: 3 %
HCT: 26.7 % — ABNORMAL LOW (ref 36.0–46.0)
Hemoglobin: 8.6 g/dL — ABNORMAL LOW (ref 12.0–15.0)
Immature Granulocytes: 3 %
Lymphocytes Relative: 13 %
Lymphs Abs: 0.9 10*3/uL (ref 0.7–4.0)
MCH: 34.1 pg — ABNORMAL HIGH (ref 26.0–34.0)
MCHC: 32.2 g/dL (ref 30.0–36.0)
MCV: 106 fL — ABNORMAL HIGH (ref 80.0–100.0)
Monocytes Absolute: 0.7 10*3/uL (ref 0.1–1.0)
Monocytes Relative: 10 %
Neutro Abs: 4.8 10*3/uL (ref 1.7–7.7)
Neutrophils Relative %: 70 %
Platelets: 57 10*3/uL — ABNORMAL LOW (ref 150–400)
RBC: 2.52 MIL/uL — ABNORMAL LOW (ref 3.87–5.11)
RDW: 20.6 % — ABNORMAL HIGH (ref 11.5–15.5)
WBC: 6.8 10*3/uL (ref 4.0–10.5)
nRBC: 0 % (ref 0.0–0.2)

## 2020-07-31 LAB — PROTIME-INR
INR: 1.8 — ABNORMAL HIGH (ref 0.8–1.2)
Prothrombin Time: 20.4 seconds — ABNORMAL HIGH (ref 11.4–15.2)

## 2020-07-31 LAB — TYPE AND SCREEN
ABO/RH(D): A POS
Antibody Screen: NEGATIVE
Unit division: 0

## 2020-07-31 LAB — BPAM RBC
Blood Product Expiration Date: 202206022359
ISSUE DATE / TIME: 202205121519
Unit Type and Rh: 6200

## 2020-07-31 LAB — GLUCOSE, CAPILLARY
Glucose-Capillary: 145 mg/dL — ABNORMAL HIGH (ref 70–99)
Glucose-Capillary: 296 mg/dL — ABNORMAL HIGH (ref 70–99)

## 2020-07-31 LAB — MAGNESIUM: Magnesium: 1.6 mg/dL — ABNORMAL LOW (ref 1.7–2.4)

## 2020-07-31 LAB — BILIRUBIN, DIRECT: Bilirubin, Direct: 3 mg/dL — ABNORMAL HIGH (ref 0.0–0.2)

## 2020-07-31 MED ORDER — ONDANSETRON 4 MG PO TBDP: 4.0000 mg | ORAL_TABLET | ORAL | Status: AC

## 2020-07-31 MED ORDER — PANTOPRAZOLE SODIUM 40 MG PO TBEC
40.0000 mg | DELAYED_RELEASE_TABLET | Freq: Every day | ORAL | Status: DC
  Administered 2020-07-31: 40 mg via ORAL
  Filled 2020-07-31: qty 1

## 2020-07-31 MED ORDER — MAGNESIUM SULFATE 4 GM/100ML IV SOLN
4.0000 g | Freq: Once | INTRAVENOUS | Status: AC
  Administered 2020-07-31: 4 g via INTRAVENOUS
  Filled 2020-07-31: qty 100

## 2020-07-31 MED ORDER — POTASSIUM CHLORIDE CRYS ER 20 MEQ PO TBCR: 40.0000 meq | EXTENDED_RELEASE_TABLET | Freq: Once | ORAL | Status: DC

## 2020-07-31 MED ORDER — LACTULOSE 10 GM/15ML PO SOLN: 30.0000 g | Freq: Three times a day (TID) | ORAL | Status: AC

## 2020-07-31 MED ORDER — PANCRELIPASE (LIP-PROT-AMYL) 12000-38000 UNITS PO CPEP: 48000.0000 [IU] | ORAL_CAPSULE | Freq: Three times a day (TID) | ORAL | 4 refills | Status: DC

## 2020-07-31 MED ORDER — POTASSIUM CHLORIDE CRYS ER 20 MEQ PO TBCR
60.0000 meq | EXTENDED_RELEASE_TABLET | Freq: Once | ORAL | Status: AC
  Administered 2020-07-31: 60 meq via ORAL
  Filled 2020-07-31: qty 3

## 2020-07-31 MED ORDER — INSULIN ASPART 100 UNIT/ML ~~LOC~~ SOLN: SUBCUTANEOUS | Status: AC

## 2020-07-31 NOTE — Care Management Important Message (Signed)
Important Message  Patient Details  Name: [name] MRN: [id number] Date of Birth: [birth year]   Medicare Important Message Given:  Yes     Tommy Medal 07/31/2020, 12:57 PM

## 2020-07-31 NOTE — Progress Notes (Signed)
Subjective: Sitting on the side of the bed. Feeling better. No confusion. A&O x4. No swelling in the abdomen or LE. Continues with chronic abdominal pain without change. No nausea or vomiting. Reports chronic daily reflux, not on a PPI. No significant swallowing problems. No NSAIDs. Had 2 BMs yesterday. Just had a large BM that was loose. No brbpr or melena.   Objective: Vital signs in last 24 hours: Temp:  [97.1 F (36.2 C)-98.5 F (36.9 C)] 97.6 F (36.4 C) (05/13 0407) Pulse Rate:  [69-89] 81 (05/13 0407) Resp:  [8-32] 20 (05/13 0407) BP: (117-141)/(41-58) 120/48 (05/13 0407) SpO2:  [82 %-99 %] 91 % (05/13 0804) Weight:  [62.1 kg] 62.1 kg (05/13 0500) Last BM Date: 07/30/20 General:   Alert and oriented, pleasant, NAD Head:  Normocephalic and atraumatic. Eyes:  Slight scleral icterus  Abdomen:  Bowel sounds present. Abdomen is protuberant but soft. Mild to moderate TTP across the upper abdomen without change. Denies any worsening. No rebound or guarding.  Msk:  Symmetrical without gross deformities. Normal posture. Extremities:  Without edema. Neurologic:  Alert and  oriented x4;  grossly normal neurologically. No asterixis.  Skin:  Warm and dry, intact without significant lesions. Psych:  Normal mood and affect.  Intake/Output from previous day: 05/12 0701 - 05/13 0700 In: 1302.5 [P.O.:916; I.V.:72.5; Blood:314] Out: 1000 [Urine:1000] Intake/Output this shift: Total I/O In: 240 [P.O.:240] Out: -   Lab Results: Recent Labs    07/29/20 0424 07/30/20 0436 07/30/20 2020 07/31/20 0556  WBC 8.4 7.3  --  6.8  HGB 7.7* 6.9* 9.0* 8.6*  HCT 24.8* 21.5* 27.1* 26.7*  PLT 51* 49*  --  57*   BMET Recent Labs    07/29/20 0424 07/30/20 0436 07/31/20 0556  NA 138 135 134*  K 3.5 2.6* 2.9*  CL 105 104 98  CO2 28 27 28   GLUCOSE 70 151* 151*  BUN 12 10 10   CREATININE 0.36* 0.36* 0.32*  CALCIUM 7.9* 7.1* 7.6*   LFT Recent Labs    07/29/20 0424 07/29/20 1007  07/30/20 0436 07/31/20 0556  PROT 4.7*  --  4.4* 5.0*  ALBUMIN 1.9*  --  1.7* 1.8*  AST 58*  --  53* 61*  ALT 22  --  21 24  ALKPHOS 59  --  67 80  BILITOT 9.4* 10.3* 7.6* 7.7*  BILIDIR  --  4.9*  --  3.0*  IBILI  --  5.4*  --   --    PT/INR Recent Labs    07/30/20 0436 07/31/20 0556  LABPROT 23.4* 20.4*  INR 2.1* 1.8*    Studies/Results: US Abdomen Limited RUQ (LIVER/GB)  Result Date: 07/29/2020 CLINICAL DATA:  Elevated liver function tests EXAM: ULTRASOUND ABDOMEN LIMITED RIGHT UPPER QUADRANT COMPARISON:  07/19/2020 CT FINDINGS: Gallbladder: Surgically absent Common bile duct: Diameter: Measures up to 10 mm today. On the order of 1.6 cm on the 07/19/2020 CT (when remeasured). No obstructive stone identified. Liver: Cirrhosis. No focal liver lesion. Cavernous transformation of the portal vein, as on recent CT. Other: Small right pleural effusion. IMPRESSION: 1. Cholecystectomy with biliary duct dilatation, as on recent CT. No cause identified. 2. 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 3. Small right pleural effusion. Electronically Signed   By: Abigail Miyamoto M.D.   On: 07/29/2020 13:52    Assessment: 55 year old female with past medical history significant of cirrhosis suspected primary NASH and somewhat of a previous history of alcohol use, insulin-dependent diabetes on insulin  pump, chronic pancreatitis, thrombocytopenia (usually in the 60,000 range), hypothyroidism admitted May 1 for altered mental status, sepsis secondary to E. coli UTI and multifocal apical pneumonia.  CT negative on admission.  CT abdomen pelvis with contrast did not reveal a source of infection except for some gas bubbles noted in the pelvic floor, nonspecific.  Low-density wall thickening of the cecum and apparent wall thickening and potential edema and proximal small bowel loops with considerations including infectious/inflammatory etiology versus secondary to hypoproteinemia.  Also with  marked dilation of extrahepatic bile duct status postcholecystectomy similar to prior studies.  Ammonia slightly elevated at 46 on admission.  She is clinically improving with broad-spectrum IV antibiotics, diuretics, lactulose, Xifaxan.  Xanax and gabapentin were also discontinued with improving mentation, later resumed due to concern for benzo withdrawal.  On May 10 she developed worsening mentation, lethargy, combativeness, requiring increased O2 requirements.  Repeat chest x-ray with some worsening multifocal pneumonia in bibasilar regions.  Concern for aspiration.  She was restarted on Lasix, IV antibiotics, speech therapy evaluation was placed (felt to be low risk for aspiration unless altered mental status).  GI consulted for decompensated cirrhosis.  Hyperbilirubinemia: Chronic finding, as outpatient up to 5.1 back in March.  Bilirubin 6.6 this admission.  Increased to 10.6 on 5/8, down to 7.7 today, direct 3.0. AST mildly elevated but stable.  Chronic extrahepatic biliary dilation on CT this admission status post cholecystectomy, stable.  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 Chronic upper abdominal pain in the setting of chronic pancreatitis unchanged. Etiology of chronic pancreatitis unknown. F/u RUQ [place] on 5/11  with cirrhosis, CBD of 10 mm, no obstructive stone, cavernous transformation of the portal vein as on recent CT.   Abdominal Pain: Chronic upper abdominal pain. Multifactorial in the setting of chronic pancreatitis and chronic GERD, not on a PPI. Will start daily PPI at this time. May need EGD outpatient with primary GI team for further evaluation.   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 Chronic thrombosis of the portal vein noted with cavernous  transformation of the porta hepatis on CT.  Follows with [facility] liver clinic who previously recommended against liver transplant considering other comorbidities.  This admission, she has had increasing INR, decreasing albumin, and altered mental status which has been multifactorial in the setting of acute illness and mildly elevated ammonia.  No encephalopathy today.  MELD 23. She received vitamin K on 5/11 with INR improved to 1.8 today. Notably, albumin is quite low at 1.8 today. Will discuss IV albumin with Dr. Laural Golden. Will need to be careful with this considering O2 requirements and pneumonia.  Altered mental status: Improved.  Likely multifactorial in the setting of acute illness with sepsis, component of hepatic encephalopathy. Maintaining on lactulose and Xifaxan for hepatic encephalopathy.  Abnormal CT small bowel: Likely secondary to cirrhosis with hypoalbuminemia.  Anemia: B12, folate, iron, ferritin all normal.  No overt GI bleeding.  Hemoglobin trickled down to 6.9 yesterday likely secondary to acute illness.  S/p 1 unit PRBCs with hemoglobin 8.6 today.  Electrolyte abnormalities: Management per hospitalist.  Plan: 1.  Continue Xifaxan 550 mg twice daily. 2.  Continue lactulose 30 mL twice daily. Can decrease dose if needed. Titrate to 2-3 BMs daily. 3.  HFP and INR daily.  4.  Needs IV albumin.  Will discuss with Dr. Laural Golden in light of pneumonia and O2 requirements.  5.  Monitor for overt GI bleeding.  6.  Start Protonix 40 mg daily.  7.  Continue pancreatic enzymes.  8.  Management of electrolytes per hospitalist.   LOS: 12 days    07/31/2020, 10:00 AM   Aliene Altes, [facility]

## 2020-07-31 NOTE — Discharge Instructions (Signed)
Cirrhosis  Cirrhosis is long-term (chronic) liver injury. The liver is the body's largest internal organ, and it performs many functions. It converts food into energy, removes toxic material from the blood, makes important proteins, and absorbs necessary vitamins from food. In cirrhosis, healthy liver cells are replaced by scar tissue. This prevents blood from flowing through the liver and makes it difficult for the liver to complete its functions. What are the causes? Common causes of this condition are hepatitis C and long-term alcohol abuse. Other causes include:  Nonalcoholic fatty liver disease (NAFLD). This happens when fat is deposited in the liver by causes other than alcohol.  Hepatitis B infection.  Autoimmune hepatitis. In this condition, the body's defense system (immune system) mistakenly attacks the liver cells, causing inflammation.  Diseases that cause blockage of ducts inside the liver.  Inherited liver diseases, such as hemochromatosis. This is one of the most common inherited liver diseases. In this disease, deposits of iron collect in the liver and other organs.  Reactions to certain long-term medicines, such as amiodarone, a heart medicine.  Parasitic infections. These include schistosomiasis, which is caused by a flatworm.  Long-term contact to certain toxins. These toxins include certain organic solvents, such as toluene and chloroform. What increases the risk? You are more likely to develop this condition if:  You have certain types of viral hepatitis.  You abuse alcohol, especially if you are female.  You are overweight.  You use IV drugs and share needles.  You have unprotected sex with someone who has viral hepatitis. What are the signs or symptoms? You may not have any signs and symptoms at first. Symptoms may not develop until the damage to your liver starts to get worse. Early symptoms may include:  Weakness and tiredness (fatigue).  Changes in  sleep patterns or having trouble sleeping.  Itchiness.  Tenderness in the right-upper part of your abdomen.  Weight loss and muscle loss.  Nausea.  Loss of appetite. Later symptoms may include:  Fatigue or weakness that is getting worse.  Yellow skin and eyes (jaundice).  Buildup of fluid in the abdomen (ascites). You may notice that your clothes are tight around your waist.  Weight gain and swelling of the feet and ankles (edema).  Trouble breathing.  Easy bruising and bleeding.  Vomiting blood, or black or bloody stool.  Mental confusion. How is this diagnosed? Your health care provider may suspect cirrhosis based on your symptoms and medical history, especially if you have other medical conditions or a history of alcohol abuse. Your health care provider will do a physical exam to feel your liver and to check for signs of cirrhosis. Tests may include:  Blood tests to check: ? For hepatitis B or C. ? Kidney function. ? Liver function.  Imaging tests such as: ? MRI or CT scan to look for changes seen in advanced cirrhosis. ? Ultrasound to see if normal liver tissue is being replaced by scar tissue.  A procedure in which a long needle is used to take a sample of liver tissue to be checked in a lab (biopsy). Liver biopsy can confirm the diagnosis of cirrhosis. How is this treated? Treatment for this condition depends on how damaged your liver is and what caused the damage. It may include treating the symptoms of cirrhosis, or treating the underlying causes to slow the damage. Treatment may include:  Making lifestyle changes, such as: ? Eating a healthy diet. You may need to work with your health care  provider or a dietitian to develop an eating plan. ? Restricting salt intake. ? Maintaining a healthy weight. ? Not abusing drugs or alcohol.  Taking medicines to: ? Treat liver infections or other infections. ? Control itching. ? Reduce fluid buildup. ? Reduce certain  blood toxins. ? Reduce risk of bleeding from enlarged blood vessels in the stomach or esophagus (varices).  Liver transplant. In this procedure, a liver from a donor is used to replace your diseased liver. This is done if cirrhosis has caused liver failure. Other treatments and procedures may be done depending on the problems that you get from cirrhosis. Common problems include liver-related kidney failure (hepatorenal syndrome). Follow these instructions at home:  Take medicines only as told by your health care provider. Do not use medicines that are toxic to your liver. Ask your health care provider before taking any new medicines, including over-the-counter medicines such as NSAIDs.  Rest as needed.  Eat a well-balanced diet.  Limit your salt or water intake, if your health care provider asks you to do this.  Do not drink alcohol. This is especially important if you routinely take acetaminophen.  Keep all follow-up visits. This is important.   Contact a health care provider if you:  Have fatigue or weakness that is getting worse.  Develop swelling of the hands, feet, or legs, or a buildup of fluid in the abdomen (ascites).  Have a fever or chills.  Develop loss of appetite.  Have nausea or vomiting.  Develop jaundice.  Develop easy bruising or bleeding. Get help right away if you:  Vomit bright red blood or a material that looks like coffee grounds.  Have blood in your stools.  Notice that your stools appear black and tarry.  Become confused.  Have chest pain or trouble breathing. These symptoms may represent a serious problem that is an emergency. Do not wait to see if the symptoms will go away. Get medical help right away. Call your local emergency services (911 in the [place] Do not drive yourself to the hospital. Summary  Cirrhosis is chronic liver injury. Common causes are hepatitis C and long-term alcohol abuse.  Tests used to diagnose cirrhosis include blood  tests, imaging tests, and liver biopsy.  Treatment for this condition involves treating the underlying cause. Avoid alcohol, drugs, salt, and medicines that may damage your liver.  Get help right away if you vomit bright red blood or a material that looks like coffee grounds. This information is not intended to replace advice given to you by your health care provider. Make sure you discuss any questions you have with your health care provider. Document Revised: 12/19/2019 Document Reviewed: 12/19/2019 Elsevier Patient Education  2021 [place].   Hypoxia Hypoxia is a condition that happens when there is a lack of oxygen in the body's tissues and organs. When there is not enough oxygen, organs cannot work as they should. This causes serious problems throughout the body and in the brain. What are the causes? This condition may be caused by:  Exposure to high altitude.  A collapsed lung (pneumothorax).  Lung infection (pneumonia).  Lung injury.  Long-term (chronic) lung disease, such as COPD (chronic obstructive pulmonary disease).  Blood collecting in the chest cavity (hemothorax).  Food, saliva, or vomit getting into the airway (aspiration).  Reduced blood flow (ischemia).  Severe blood loss.  Slow or shallow breathing (hypoventilation).  Blood disorders, such as anemia.  Carbon monoxide poisoning.  The heart suddenly stopping (cardiac arrest).  Anesthetic medicines.  Drowning.  Choking. What are the signs or symptoms? Symptoms of this condition include:  Headache.  Fatigue.  Drowsiness.  Forgetfulness.  Nausea.  Confusion.  Shortness of breath.  Dizziness.  Bluish color of the skin, lips, or nail beds (cyanosis).  Change in consciousness or awareness. If hypoxia is not treated, it can lead to convulsions, loss of consciousness (coma), or brain damage. How is this diagnosed? This condition may be diagnosed based on:  A physical exam.  Blood  tests.  A test that measures how much oxygen is in your blood (pulse oximetry). This is done with a sensor that is placed on your finger, toe, or earlobe.  Chest X-ray.  Tests to check your lung function (pulmonary function tests).  A test to check the electrical activity of your heart (electrocardiogram, ECG). You may have other tests to determine the cause of your hypoxia. How is this treated? Treatment for this condition depends on what is causing the hypoxia. You will likely be treated with oxygen therapy. This may be done by giving you oxygen through a face mask or through tubes in your nose. Your health care provider may also recommend other therapies to treat the underlying cause of your hypoxia.   Follow these instructions at home:  Take over-the-counter and prescription medicines only as told by your health care provider.  Do not use any products that contain nicotine or tobacco, such as cigarettes and e-cigarettes. If you need help quitting, ask your health care provider.  Avoid secondhand smoke.  Work with your health care provider to manage any chronic conditions you have that may be causing hypoxia, such as COPD.  Keep all follow-up visits as told by your health care provider. This is important. Contact a health care provider if:  You have a fever.  You have trouble breathing, even after treatment.  You become extremely short of breath when you exercise. Get help right away if:  Your shortness of breath gets worse, especially with normal or very little activity.  Your skin, lips, or nail beds have a bluish color.  You become confused or you cannot think properly.  You have chest pain. Summary  Hypoxia is a condition that happens when there is a lack of oxygen in the body's tissues and organs.  If hypoxia is not treated, it can lead to convulsions, loss of consciousness (coma), or brain damage.  Symptoms of hypoxia can include a headache, shortness of breath,  confusion, nausea, and a bluish skin color.  Hypoxia has many possible causes, including exposure to high altitude, carbon monoxide poisoning, or other health issues, such as blood disorders or cardiac arrest.  Hypoxia is usually treated with oxygen therapy. This information is not intended to replace advice given to you by your health care provider. Make sure you discuss any questions you have with your health care provider. Document Revised: 11/07/2019 Document Reviewed: 11/07/2019 Elsevier Patient Education  2021 [place].   Thrombocytopenia Thrombocytopenia is a condition in which you have a low number of platelets in your blood. Platelets are also called thrombocytes. Platelets are tiny cells in the blood. When you bleed, they clump together at the cut or injury to stop the bleeding. This is called blood clotting. Not having enough platelets can cause bleeding problems. Some cases of thrombocytopenia are mild while others are more severe. What are the causes? This condition may be caused by:  Decreased production of platelets. This may be caused by: ? Aplastic anemia.  This is when your bone marrow stops making blood cells. ? Cancer in the bone marrow. ? Certain medicines, including chemotherapy. ? Infection in the bone marrow. ? Drinking a lot of alcohol.  Increased destruction of platelets. This may be caused by: ? Certain immune diseases. ? Certain medicines. ? Certain blood clotting disorders. ? Certain inherited disorders. ? Certain bleeding disorders. ? Pregnancy. ? Having an enlarged spleen (hypersplenism). In hypersplenism, the spleen gathers up platelets from circulation. This means that the platelets are not available to help with blood clotting. The spleen can be enlarged because of cirrhosis or other conditions. What are the signs or symptoms? Symptoms of this condition are the result of poor blood clotting. They will vary depending on how low the platelet counts  are. Symptoms may include:  Abnormal bleeding.  Nosebleeds.  Heavy menstrual periods.  Blood in the urine or stool (feces).  A purplish discoloration in the skin (purpura).  Bruising.  A rash that looks like pinpoint, purplish-red spots (petechiae) on the skin and mucous membranes. How is this diagnosed? This condition may be diagnosed with blood tests and a physical exam. Sometimes, a sample of bone marrow may be removed to look for the original cells (megakaryocytes) that make platelets. Other tests may be needed depending on the cause.   How is this treated? Treatment for this condition depends on the cause. Treatment options may include:  Treatment of another condition that is causing the low platelet count.  Medicines to help protect your platelets from being destroyed.  A replacement (transfusion) of platelets to stop or prevent bleeding.  Surgery to remove the spleen. Follow these instructions at home: Activity  Avoid activities that could cause injury or bruising, and follow instructions about how to prevent falls.  Take extra care not to cut yourself when you shave or when you use scissors, needles, knives, and other tools.  Take extra care to protect yourself from burns when ironing or cooking. General instructions  Check your skin and the inside of your mouth for bruising or bleeding as told by your health care provider.  Check your spit (sputum), urine, and stool for blood as told by your health care provider.  Do not drink alcohol.  Take over-the-counter and prescription medicines only as told by your health care provider.  Do not take any medicines that have aspirin or NSAIDs in them. These medicines can thin your blood and cause you to bleed more easily.  Tell all your health care providers, including dentists and eye doctors, about your condition.   Contact a health care provider if you have:  Unexplained bruising. Get help right away if you  have:  Active bleeding from anywhere on your body.  Blood in your sputum, urine, or stool. Summary  Thrombocytopenia is a condition in which you have a low number of platelets in your blood.  Platelets are needed for blood clotting.  Symptoms of this condition are the result of poor blood clotting and may include abnormal bleeding, nosebleeds, and bruising.  This condition may be diagnosed with blood tests and a physical exam.  Treatment for this condition depends on the cause. This information is not intended to replace advice given to you by your health care provider. Make sure you discuss any questions you have with your health care provider. Document Revised: 12/07/2017 Document Reviewed: 12/07/2017 Elsevier Patient Education  2021 [place].    IMPORTANT INFORMATION: PAY CLOSE ATTENTION   PHYSICIAN DISCHARGE INSTRUCTIONS  Follow with Primary  care provider  [facility]  and other consultants as instructed by your Hospitalist Physician  [place] IF SYMPTOMS COME BACK, WORSEN OR NEW PROBLEM DEVELOPS   Please note: You were cared for by a hospitalist during your hospital stay. Every effort will be made to forward records to your primary care provider.  You can request that your primary care provider send for your hospital records if they have not received them.  Once you are discharged, your primary care physician will handle any further medical issues. Please note that NO REFILLS for any discharge medications will be authorized once you are discharged, as it is imperative that you return to your primary care physician (or establish a relationship with a primary care physician if you do not have one) for your post hospital discharge needs so that they can reassess your need for medications and monitor your lab values.  Please get a complete blood count and chemistry panel checked by your Primary MD at your next visit, and again as  instructed by your Primary MD.  Get Medicines reviewed and adjusted: Please take all your medications with you for your next visit with your Primary MD  Laboratory/radiological data: Please request your Primary MD to go over all hospital tests and procedure/radiological results at the follow up, please ask your primary care provider to get all Hospital records sent to his/her office.  In some cases, they will be blood work, cultures and biopsy results pending at the time of your discharge. Please request that your primary care provider follow up on these results.  If you are diabetic, please bring your blood sugar readings with you to your follow up appointment with primary care.    Please call and make your follow up appointments as soon as possible.    Also Note the following: If you experience worsening of your admission symptoms, develop shortness of breath, life threatening emergency, suicidal or homicidal thoughts you must seek medical attention immediately by calling 911 or calling your MD immediately  if symptoms less severe.  You must read complete instructions/literature along with all the possible adverse reactions/side effects for all the Medicines you take and that have been prescribed to you. Take any new Medicines after you have completely understood and accpet all the possible adverse reactions/side effects.   Do not drive when taking Pain medications or sleeping medications (Benzodiazepines)  Do not take more than prescribed Pain, Sleep and Anxiety Medications. It is not advisable to combine anxiety,sleep and pain medications without talking with your primary care practitioner  Special Instructions: If you have smoked or chewed Tobacco  in the last 2 yrs please stop smoking, stop any regular Alcohol  and or any Recreational drug use.  Wear Seat belts while driving.  Do not drive if taking any narcotic, mind altering or controlled substances or recreational drugs or alcohol.

## 2020-07-31 NOTE — Progress Notes (Signed)
Has been oriented x 4 today and had a loose stool this morning.  PICC line removed and vasoline gauze and clear dressing applied.  Scant bleeding.  DC papers reviewed with family and patient.  Rollator delivered to room Family to drive home

## 2020-07-31 NOTE — Discharge Summary (Signed)
Physician Discharge Summary  QUINNIE BARCELO INO:[id number] DOB: [birth year] DOA: 07/19/2020  PCP: [facility]  Admit date: 07/19/2020 Discharge date: 07/31/2020  Admitted From:  HOME  Disposition: [place]  Recommendations for Outpatient Follow-up:  1. Follow up with PCP in 1 weeks 2. Follow up with GI at [facility] in 2 weeks  3. Avoid sedating medication as much as possible 4. Ambulatory referral to palliative care.    Home Health: RN, PT   Discharge Condition: STABLE   CODE STATUS: DNR   DIET: soft foods diet heart/carb modified   Brief Hospitalization Summary: Please see all hospital notes, images, labs for full details of the hospitalization. ADMISSION HPI: [name] is a 55 y.o. female with medical history significant of cirrhosis, insulin-dependent diabetes on insulin pump, chronic pancreatitis, thrombocytopenia, hypothyroidism who was admitted on admissions for hepatic encephalopathy.  She was brought to the hospital today with altered mental status.  According to records, patient was in her usual state of health last night.  Her patient had gone to be rechecked and she was noted to be lethargic/confused.  Patient did not have any recollection of these events.  She says that she has felt generally unwell for the last week.  She has noticed her urine has become more dark and more foul-smelling.  She does not have any dysuria.  She has chronic shortness of breath but does not really have a cough or sore throat.  She has nausea which is also a chronic issue.  She does have loose stools, but attributes this to her lactulose use which she has been using.  She denies any sores on her skin.  She does complain of neck pain that began this morning.  She denies having it yesterday.  She feels she may have slept in an uncomfortable way.  Denies any changes in her vision.  She has chronic abdominal which she feels is relatively changed  ED Course: On arrival to the emergency  room she is medically agreeable.  She is mildly tachycardic, but blood pressure stable.  Imaging did not indicate any anemia.  CT of the abdomen pelvis did not show any clear focus of infection.  There is mention scattered areas of bowel thickening but this may also be present low-protein states.  CT head was also noted to be unremarkable.  CT angiogram of chest was negative for pulmonary embolism.    HOSPITAL COURSE:   Hepatic encephalopathy - resolved now. Appreciate GI consult and recommendations.  Ammonia down today.  Outpatient follow up with her GI at [facility] recommended.  Continue lactulose and rifaximin to goal of 3 stools per day.     Hyperbilirubinemia - total bilirubin trending down.      Acute respiratory failure with hypoxia secondary to multifocal/aspiration pneumonia - Completed IV antibiotics.  Continue supportive measures for now. Pt remains DNR/DNI.  PCCM consultation appreciated.   Goal now is to [facility] oxygen down to goal of 4L/m or less which is her baseline.  She will remain on supplemental oxygen at discharge.    Hypomagnesemia - IV replacement given and repleted.   Hypokalemia - oral replacement given and repleted.    Thrombocytopenia - due to chronic liver disease/cirrhosis - stable, no active bleeding.   Anemia of chronic disease- Hg down to 6.9.  Transfuse 1 unit PRBC today, recheck CBC in AM.    Hypothryoidism - resumed home levothyroxine.   Chronic pancreatitis - stable on home Creon.   Type  2 DM with hypoglycemia - resume home therapy treatment program.  Follow [facility] closely.    DVT prophylaxis: SCD Code Status: DNR  Family Communication:  Spouse/fiance at bedside  Disposition: home with [facility]   Discharge Diagnoses:  Active Problems:   Thrombocytopenia (HCC)   Alcoholic cirrhosis of liver without ascites (HCC)   Cirrhosis ([place])   Essential hypertension   Serum total bilirubin elevated   Acquired hypothyroidism   Type 2 diabetes mellitus ([place])    Coagulopathy (HCC)   Altered mental status   Sepsis ([place])   Acute lower UTI   DNR (do not resuscitate)   Pancreatic insufficiency   Malnutrition of moderate degree   Pressure injury of skin   Acute hypoxemic respiratory failure ([place])   Hypoxia   Discharge Instructions:  Allergies as of 07/31/2020      Reactions   Acetaminophen Hives, Other (See Comments)   Tolerates Fioricet   Cephalexin Itching, Swelling, Other (See Comments)   Tongue swells and makes throat itchy- can tolerate Zosyn   Ciprofloxacin Hives   Hydromorphone Hives   Sulfamethoxazole-trimethoprim Shortness Of Breath, Swelling, Other (See Comments)   Tongue swells   Abilify [aripiprazole]    "I did noyt like the way this made me feel."      Medication List    STOP taking these medications   diphenhydrAMINE 25 MG tablet Commonly known as: BENADRYL   gabapentin 100 MG capsule Commonly known as: NEURONTIN   megestrol 40 MG tablet Commonly known as: MEGACE   mirtazapine 15 MG tablet Commonly known as: REMERON   Oxycodone HCl 10 MG Tabs   potassium chloride SA 20 MEQ tablet Commonly known as: KLOR-CON     TAKE these medications   albuterol (2.5 MG/3ML) 0.083% nebulizer solution Commonly known as: PROVENTIL Inhale 2.5 mg into the lungs every 6 (six) hours as needed for wheezing or shortness of breath.   albuterol 108 (90 Base) MCG/ACT inhaler Commonly known as: VENTOLIN HFA Inhale 2 puffs into the lungs every 4 (four) hours as needed for wheezing or shortness of breath.   Dexcom G6 Sensor Misc Inject into the skin See admin instructions. Place a new sensor under the skin every 10 days   furosemide 20 MG tablet Commonly known as: LASIX Take 20 mg by mouth in the morning and at bedtime.   insulin aspart 100 UNIT/ML injection Commonly known as: novoLOG Via Insulin Pump What changed:   how much to take  how to take this  when to take this  additional instructions   insulin pump  Soln Inject into the skin continuous.   lactulose 10 GM/15ML solution Commonly known as: CHRONULAC Take 45 mLs (30 g total) by mouth 3 (three) times daily. Take 30 grams (45 ml's) by mouth three to five times a day until 3 bowel movements a day are achieved, then as directed   levothyroxine 50 MCG tablet Commonly known as: SYNTHROID Take 50 mcg by mouth daily before breakfast.   lipase/protease/amylase 12000-38000 units Cpep capsule Commonly known as: CREON Take 4 capsules (48,000 Units total) by mouth 3 (three) times daily before meals. What changed:   how much to take  Another medication with the same name was removed. Continue taking this medication, and follow the directions you see here.   NITROGLYCERIN PO Place 1 tablet under the tongue every 5 (five) minutes as needed (as directed, for chest pain).   ondansetron 4 MG disintegrating tablet Commonly known as: Zofran ODT Take 1 tablet (4  mg total) by mouth See admin instructions. Dissolve 4 mg orally one to two times a day   OXYGEN Inhale 2-4 L/min into the lungs as needed (for shortness of breath).   Vitamin D (Ergocalciferol) 1.25 MG (50000 UNIT) Caps capsule Commonly known as: DRISDOL Take 1 capsule by mouth once a week.   Xifaxan 550 MG Tabs tablet Generic drug: rifaximin Take 550 mg by mouth 2 (two) times daily.       Follow-up Information    Sanchez-Brugal, Myer Peer, MD Follow up in 5 day(s).   Specialty: Internal Medicine Why: hospital follow up appointment Contact information: [place] [id number] [phone]              Allergies  Allergen Reactions  . Acetaminophen Hives and Other (See Comments)    Tolerates Fioricet  . Cephalexin Itching, Swelling and Other (See Comments)    Tongue swells and makes throat itchy- can tolerate Zosyn  . Ciprofloxacin Hives  . Hydromorphone Hives  . Sulfamethoxazole-Trimethoprim Shortness Of Breath, Swelling and Other (See Comments)     Tongue swells   . Abilify [Aripiprazole]     "I did noyt like the way this made me feel."   Allergies as of 07/31/2020      Reactions   Acetaminophen Hives, Other (See Comments)   Tolerates Fioricet   Cephalexin Itching, Swelling, Other (See Comments)   Tongue swells and makes throat itchy- can tolerate Zosyn   Ciprofloxacin Hives   Hydromorphone Hives   Sulfamethoxazole-trimethoprim Shortness Of Breath, Swelling, Other (See Comments)   Tongue swells   Abilify [aripiprazole]    "I did noyt like the way this made me feel."      Medication List    STOP taking these medications   diphenhydrAMINE 25 MG tablet Commonly known as: BENADRYL   gabapentin 100 MG capsule Commonly known as: NEURONTIN   megestrol 40 MG tablet Commonly known as: MEGACE   mirtazapine 15 MG tablet Commonly known as: REMERON   Oxycodone HCl 10 MG Tabs   potassium chloride SA 20 MEQ tablet Commonly known as: KLOR-CON     TAKE these medications   albuterol (2.5 MG/3ML) 0.083% nebulizer solution Commonly known as: PROVENTIL Inhale 2.5 mg into the lungs every 6 (six) hours as needed for wheezing or shortness of breath.   albuterol 108 (90 Base) MCG/ACT inhaler Commonly known as: VENTOLIN HFA Inhale 2 puffs into the lungs every 4 (four) hours as needed for wheezing or shortness of breath.   Dexcom G6 Sensor Misc Inject into the skin See admin instructions. Place a new sensor under the skin every 10 days   furosemide 20 MG tablet Commonly known as: LASIX Take 20 mg by mouth in the morning and at bedtime.   insulin aspart 100 UNIT/ML injection Commonly known as: novoLOG Via Insulin Pump What changed:   how much to take  how to take this  when to take this  additional instructions   insulin pump Soln Inject into the skin continuous.   lactulose 10 GM/15ML solution Commonly known as: CHRONULAC Take 45 mLs (30 g total) by mouth 3 (three) times daily. Take 30 grams (45 ml's) by mouth  three to five times a day until 3 bowel movements a day are achieved, then as directed   levothyroxine 50 MCG tablet Commonly known as: SYNTHROID Take 50 mcg by mouth daily before breakfast.   lipase/protease/amylase 12000-38000 units Cpep capsule Commonly known as: CREON Take 4 capsules (48,000  Units total) by mouth 3 (three) times daily before meals. What changed:   how much to take  Another medication with the same name was removed. Continue taking this medication, and follow the directions you see here.   NITROGLYCERIN PO Place 1 tablet under the tongue every 5 (five) minutes as needed (as directed, for chest pain).   ondansetron 4 MG disintegrating tablet Commonly known as: Zofran ODT Take 1 tablet (4 mg total) by mouth See admin instructions. Dissolve 4 mg orally one to two times a day   OXYGEN Inhale 2-4 L/min into the lungs as needed (for shortness of breath).   Vitamin D (Ergocalciferol) 1.25 MG (50000 UNIT) Caps capsule Commonly known as: DRISDOL Take 1 capsule by mouth once a week.   Xifaxan 550 MG Tabs tablet Generic drug: rifaximin Take 550 mg by mouth 2 (two) times daily.       Procedures/Studies: DG Chest 1 View  Result Date: 07/25/2020 CLINICAL DATA:  Acute hypoxia with respiratory failure.  Diabetes. EXAM: CHEST  1 VIEW COMPARISON:  07/21/2020 FINDINGS: Lungs are adequately inflated and demonstrate slight interval worsening bilateral airspace consolidation most prominent over the upper lobes. Findings most suggestive of multifocal infection. No evidence of effusion. Improvement to resolution of small effusions. Cardiomediastinal silhouette and remainder of the exam is unchanged. IMPRESSION: Slight interval worsening bilateral airspace process most prominent over the upper lobes likely multifocal infection. Electronically Signed   By: Marin Olp M.D.   On: 07/25/2020 10:40   CT Head Wo Contrast  Result Date: 07/19/2020 CLINICAL DATA:  Mental status change  EXAM: CT HEAD WITHOUT CONTRAST TECHNIQUE: Contiguous axial images were obtained from the base of the skull through the vertex without intravenous contrast. COMPARISON:  02/29/2020 FINDINGS: Brain: There is no acute intracranial hemorrhage, mass effect, or edema. Gray-white differentiation is preserved. There is no extra-axial fluid collection. Ventricles and sulci are within normal limits in size and configuration. Vascular: There is atherosclerotic calcification at the skull base. Skull: Calvarium is unremarkable. Sinuses/Orbits: No acute finding. Other: None. IMPRESSION: No acute intracranial abnormality. Electronically Signed   By: Macy Mis M.D.   On: 07/19/2020 12:54   CT Angio Chest PE W/Cm &/Or Wo Cm  Result Date: 07/19/2020 CLINICAL DATA:  Mental status changes.  Lethargy. EXAM: CT ANGIOGRAPHY CHEST CT ABDOMEN AND PELVIS WITH CONTRAST TECHNIQUE: Multidetector CT imaging of the chest was performed using the standard protocol during bolus administration of intravenous contrast. Multiplanar CT image reconstructions and MIPs were obtained to evaluate the vascular anatomy. Multidetector CT imaging of the abdomen and pelvis was performed using the standard protocol during bolus administration of intravenous contrast. CONTRAST:  143m OMNIPAQUE IOHEXOL 350 MG/ML SOLN COMPARISON:  Abdomen and pelvis CT 05/21/2020 FINDINGS: CTA CHEST FINDINGS Cardiovascular: The heart is enlarged. No substantial pericardial effusion. Coronary artery calcification is evident. Atherosclerotic calcification is noted in the wall of the thoracic aorta. There is no filling defect within the opacified pulmonary arteries to suggest the presence of an acute pulmonary embolus. Mediastinum/Nodes: Mild mediastinal lymphadenopathy shows slight progression. 13 mm short axis subcarinal node was 9 mm previously. 12 mm short axis right paratracheal node on 86/7 with somewhat obscured previously but measured about 8 mm. No left hilar  lymphadenopathy. No substantial lymphadenopathy in the right hilum. The esophagus has normal imaging features. There is no axillary lymphadenopathy. Lungs/Pleura: Subsegmental atelectasis noted in the lungs bilaterally, basilar predominant. No overt pulmonary edema. No pleural effusion. No suspicious pulmonary nodule or mass. Musculoskeletal: No  worrisome lytic or sclerotic osseous abnormality. Review of the MIP images confirms the above findings. CT ABDOMEN and PELVIS FINDINGS Hepatobiliary: Nodular hepatic contour again noted compatible with cirrhosis. Stable 8 mm subcapsular low-density lesion posterior right liver, too small to characterize but likely a cyst. Gallbladder is surgically absent with marked dilatation of the extrahepatic bile ducts. Common bile duct measures 14 mm diameter in the head of the pancreas, at the same level it measured 15 mm previously. Pancreas: No focal mass lesion. No dilatation of the main duct. No intraparenchymal cyst. No peripancreatic edema. Spleen: 15.5 cm craniocaudal length, enlarged. Adrenals/Urinary Tract: No adrenal nodule or mass. Tiny hypodensities lower pole right kidney are too small to characterize but likely benign. Similar probably benign tiny hypodensity noted upper po no hydronephrosis No evidence for hydroureter. The urinary bladder appears normal for the degree of distention. Stomach/Bowel: Stomach is unremarkable. No gastric wall thickening. No evidence of outlet obstruction. Duodenum is normally positioned as is the ligament of Treitz. Jejunal loops have an appearance suggesting wall thickening but are nondilated. Terminal ileum unremarkable. Low-density wall thickening noted in the cecum. Colon is decompressed. Scattered diverticuli are seen in the left colon without diverticulitis. Vascular/Lymphatic: There is abdominal aortic atherosclerosis without aneurysm. 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  Marked venous collateralization noted in the left upper abdomen with features suggesting spleno renal shunt. There is no gastrohepatic or hepatoduodenal ligament lymphadenopathy. No retroperitoneal or mesenteric lymphadenopathy. No pelvic sidewall lymphadenopathy. Reproductive: The uterus is unremarkable.  There is no adnexal mass. Other: Mesenteric edema is associated with small volume free fluid in the pelvis. Multiple gas bubbles are seen in the extraperitoneal left pelvic floor, posterior to the bladder and adjacent to the vagina. These have a curvilinear/serpiginous character in some areas raising the question of gas within venous anatomy. No soft tissue or vascular gas elsewhere in the pelvis. As above, there is no evidence of diverticulitis. Musculoskeletal: Diffuse body wall edema. No worrisome lytic or sclerotic osseous abnormality. Review of the MIP images confirms the above findings. IMPRESSION: 1. No CT evidence for acute pulmonary embolus. 2. Mild mediastinal lymphadenopathy, potentially reactive. 3. 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 Splenomegaly with extensive collateralization noted in the left abdomen and spleno renal shunt anatomy is consistent with portal venous hypertension. 4. Low-density wall thickening in the cecum with apparent wall thickening and potential edema in proximal small bowel loops. Infectious/inflammatory etiology a consideration although this could also be seen in the setting of systemic disease (hypoproteinemia). 5. Multiple tiny gas bubbles are seen in the extraperitoneal left pelvic floor, posterior to the bladder and adjacent to the vagina. These have a curvilinear/serpiginous character in some areas raising the question of gas within venous anatomy. No soft tissue or vascular gas elsewhere in the pelvis. No findings to explain the presence of this gas. Follow-up could be used to ensure resolution as clinical picture dictates. 6. Marked  dilatation of the extrahepatic bile ducts status post cholecystectomy. This may be related to the prior cholecystectomy and is similar to prior studies. Correlation with liver function test may prove helpful. 7. Diffuse body wall edema with small volume free fluid in the pelvis. 8. Aortic Atherosclerosis (ICD10-I70.0). Electronically Signed   By: Misty Stanley M.D.   On: 07/19/2020 13:17   CT Abdomen Pelvis W Contrast  Result Date: 07/19/2020 CLINICAL DATA:  Mental status changes.  Lethargy. EXAM: CT ANGIOGRAPHY CHEST CT ABDOMEN AND PELVIS  WITH CONTRAST TECHNIQUE: Multidetector CT imaging of the chest was performed using the standard protocol during bolus administration of intravenous contrast. Multiplanar CT image reconstructions and MIPs were obtained to evaluate the vascular anatomy. Multidetector CT imaging of the abdomen and pelvis was performed using the standard protocol during bolus administration of intravenous contrast. CONTRAST:  147m OMNIPAQUE IOHEXOL 350 MG/ML SOLN COMPARISON:  Abdomen and pelvis CT 05/21/2020 FINDINGS: CTA CHEST FINDINGS Cardiovascular: The heart is enlarged. No substantial pericardial effusion. Coronary artery calcification is evident. Atherosclerotic calcification is noted in the wall of the thoracic aorta. There is no filling defect within the opacified pulmonary arteries to suggest the presence of an acute pulmonary embolus. Mediastinum/Nodes: Mild mediastinal lymphadenopathy shows slight progression. 13 mm short axis subcarinal node was 9 mm previously. 12 mm short axis right paratracheal node on 86/7 with somewhat obscured previously but measured about 8 mm. No left hilar lymphadenopathy. No substantial lymphadenopathy in the right hilum. The esophagus has normal imaging features. There is no axillary lymphadenopathy. Lungs/Pleura: Subsegmental atelectasis noted in the lungs bilaterally, basilar predominant. No overt pulmonary edema. No pleural effusion. No suspicious  pulmonary nodule or mass. Musculoskeletal: No worrisome lytic or sclerotic osseous abnormality. Review of the MIP images confirms the above findings. CT ABDOMEN and PELVIS FINDINGS Hepatobiliary: Nodular hepatic contour again noted compatible with cirrhosis. Stable 8 mm subcapsular low-density lesion posterior right liver, too small to characterize but likely a cyst. Gallbladder is surgically absent with marked dilatation of the extrahepatic bile ducts. Common bile duct measures 14 mm diameter in the head of the pancreas, at the same level it measured 15 mm previously. Pancreas: No focal mass lesion. No dilatation of the main duct. No intraparenchymal cyst. No peripancreatic edema. Spleen: 15.5 cm craniocaudal length, enlarged. Adrenals/Urinary Tract: No adrenal nodule or mass. Tiny hypodensities lower pole right kidney are too small to characterize but likely benign. Similar probably benign tiny hypodensity noted upper po no hydronephrosis No evidence for hydroureter. The urinary bladder appears normal for the degree of distention. Stomach/Bowel: Stomach is unremarkable. No gastric wall thickening. No evidence of outlet obstruction. Duodenum is normally positioned as is the ligament of Treitz. Jejunal loops have an appearance suggesting wall thickening but are nondilated. Terminal ileum unremarkable. Low-density wall thickening noted in the cecum. Colon is decompressed. Scattered diverticuli are seen in the left colon without diverticulitis. Vascular/Lymphatic: There is abdominal aortic atherosclerosis without aneurysm. 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 Marked venous collateralization noted in the left upper abdomen with features suggesting spleno renal shunt. There is no gastrohepatic or hepatoduodenal ligament lymphadenopathy. No retroperitoneal or mesenteric lymphadenopathy. No pelvic sidewall lymphadenopathy. Reproductive: The uterus is unremarkable.   There is no adnexal mass. Other: Mesenteric edema is associated with small volume free fluid in the pelvis. Multiple gas bubbles are seen in the extraperitoneal left pelvic floor, posterior to the bladder and adjacent to the vagina. These have a curvilinear/serpiginous character in some areas raising the question of gas within venous anatomy. No soft tissue or vascular gas elsewhere in the pelvis. As above, there is no evidence of diverticulitis. Musculoskeletal: Diffuse body wall edema. No worrisome lytic or sclerotic osseous abnormality. Review of the MIP images confirms the above findings. IMPRESSION: 1. No CT evidence for acute pulmonary embolus. 2. Mild mediastinal lymphadenopathy, potentially reactive. 3. 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 Splenomegaly with extensive collateralization noted in the left abdomen and spleno renal shunt anatomy is consistent  with portal venous hypertension. 4. Low-density wall thickening in the cecum with apparent wall thickening and potential edema in proximal small bowel loops. Infectious/inflammatory etiology a consideration although this could also be seen in the setting of systemic disease (hypoproteinemia). 5. Multiple tiny gas bubbles are seen in the extraperitoneal left pelvic floor, posterior to the bladder and adjacent to the vagina. These have a curvilinear/serpiginous character in some areas raising the question of gas within venous anatomy. No soft tissue or vascular gas elsewhere in the pelvis. No findings to explain the presence of this gas. Follow-up could be used to ensure resolution as clinical picture dictates. 6. Marked dilatation of the extrahepatic bile ducts status post cholecystectomy. This may be related to the prior cholecystectomy and is similar to prior studies. Correlation with liver function test may prove helpful. 7. Diffuse body wall edema with small volume free fluid in the pelvis. 8. Aortic Atherosclerosis  (ICD10-I70.0). Electronically Signed   By: Misty Stanley M.D.   On: 07/19/2020 13:17   DG Chest Port 1 View  Result Date: 07/28/2020 CLINICAL DATA:  Shortness of breath EXAM: PORTABLE CHEST 1 VIEW COMPARISON:  Jul 25, 2020 FINDINGS: Central catheter tip in superior vena cava. No appreciable pneumothorax. There is a left pleural effusion with consolidation in the left lower lobe, new from most recent chest radiograph. Extensive airspace opacity in each upper lobe is stable. New patchy airspace opacity noted in right base. Heart is upper normal in size with pulmonary vascularity normal. No adenopathy. No bone lesions. IMPRESSION: Central catheter as described without appreciable pneumothorax. New infiltrate left base with small left pleural effusion. Extensive upper lobe airspace opacity bilaterally essentially stable. New patchy airspace opacity right base. Appearance consistent with multifocal pneumonia, progressed from most recent study. Stable cardiac silhouette. Electronically Signed   By: Lowella Grip III M.D.   On: 07/28/2020 11:14   DG CHEST PORT 1 VIEW  Result Date: 07/21/2020 CLINICAL DATA:  Hypoxia EXAM: PORTABLE CHEST 1 VIEW COMPARISON:  07/19/2020 FINDINGS: Since the prior examination, extensive bilateral slightly asymmetric diffuse pulmonary infiltrate has developed, most in keeping with acute infection or, given its relatively rapid development, asymmetric pulmonary edema. Probable tiny left pleural effusion. No pneumothorax. Cardiac size is mildly enlarged. No acute bone abnormality. IMPRESSION: Interval development of extensive bilateral slightly asymmetric pulmonary infiltrate and small left pleural effusion, most likely representing moderate cardiogenic failure given its relatively rapid development. Acute infection, however, could appear similarly. Electronically Signed   By: Fidela Salisbury MD   On: 07/21/2020 23:54   DG Chest Port 1 View  Result Date: 07/19/2020 CLINICAL DATA:   Possible sepsis, altered mental status EXAM: PORTABLE CHEST 1 VIEW COMPARISON:  07/12/2020 FINDINGS: Similar cardiomediastinal contours. Chronic interstitial prominence with pulmonary vascular congestion. No significant pleural effusion. No pneumothorax. IMPRESSION: Chronic interstitial prominence with vascular congestion. Electronically Signed   By: Macy Mis M.D.   On: 07/19/2020 11:12   ECHOCARDIOGRAM COMPLETE  Result Date: 07/25/2020    ECHOCARDIOGRAM REPORT   Patient Name:   CHARLYN VIALPANDO Date of Exam: 07/25/2020 Medical Rec #:  [id number]         Height:       62.0 in Accession #:    [id number]        Weight:       134.3 lb Date of Birth:  [birth year]         BSA:          1.614 m Patient Age:  55 years          BP:           138/65 mmHg Patient Gender: F                 HR:           100 bpm. Exam Location:  [facility] Procedure: 2D Echo, Cardiac Doppler and Color Doppler Indications:    Acute Respiratory Distress R06.03  History:        Patient has no prior history of Echocardiogram examinations.                 Risk Factors:Diabetes, Sleep Apnea and GERD.  Sonographer:    Jonelle Sidle Dance Referring Phys: [id number] [place] D [place]  1. Left ventricular ejection fraction, by estimation, is 65 to 70%. The left ventricle has normal function. The left ventricle has no regional wall motion abnormalities. There is mild left ventricular hypertrophy. Indeterminate diastolic filling due to E-A fusion.  2. Right ventricular systolic function is normal. The right ventricular size is normal. There is normal pulmonary artery systolic pressure. The estimated right ventricular systolic pressure is 34.2 mmHg.  3. Left atrial size was severely dilated.  4. The mitral valve is normal in structure. No evidence of mitral valve regurgitation. No evidence of mitral stenosis.  5. The aortic valve is grossly normal. There is mild calcification of the aortic valve. Aortic valve regurgitation is not visualized. Mild to  moderate aortic valve sclerosis/calcification is present, with borderline findings of aortic stenosis, if present it is mild.  6. Increased flow velocities may be secondary to anemia, thyrotoxicosis, hyperdynamic or high flow state.  7. The inferior vena cava is dilated in size with >50% respiratory variability, suggesting right atrial pressure of 8 mmHg. FINDINGS  Left Ventricle: Left ventricular ejection fraction, by estimation, is 65 to 70%. The left ventricle has normal function. The left ventricle has no regional wall motion abnormalities. The left ventricular internal cavity size was normal in size. There is  mild left ventricular hypertrophy. Indeterminate diastolic filling due to E-A fusion. Right Ventricle: The right ventricular size is normal. No increase in right ventricular wall thickness. Right ventricular systolic function is normal. There is normal pulmonary artery systolic pressure. The tricuspid regurgitant velocity is 2.59 m/s, and  with an assumed right atrial pressure of 8 mmHg, the estimated right ventricular systolic pressure is 87.6 mmHg. Left Atrium: Left atrial size was severely dilated. Right Atrium: Right atrial size was normal in size. Pericardium: There is no evidence of pericardial effusion. Mitral Valve: The mitral valve is normal in structure. Mild mitral annular calcification. No evidence of mitral valve regurgitation. No evidence of mitral valve stenosis. Tricuspid Valve: The tricuspid valve is normal in structure. Tricuspid valve regurgitation is trivial. No evidence of tricuspid stenosis. Aortic Valve: The aortic valve is grossly normal. There is mild calcification of the aortic valve. Aortic valve regurgitation is not visualized. Mild to moderate aortic valve sclerosis/calcification is present, without any evidence of aortic stenosis. Aortic valve mean gradient measures 14.0 mmHg. Aortic valve peak gradient measures 25.0 mmHg. Aortic valve area, by VTI measures 1.31 cm. Pulmonic  Valve: The pulmonic valve was normal in structure. Pulmonic valve regurgitation is trivial. No evidence of pulmonic stenosis. Aorta: The aortic root is normal in size and structure. Venous: The inferior vena cava is dilated in size with greater than 50% respiratory variability, suggesting right atrial pressure of 8 mmHg. IAS/Shunts: The interatrial septum was not  well visualized.  LEFT VENTRICLE PLAX 2D LVIDd:         5.01 cm  Diastology LVIDs:         3.27 cm  LV e' medial:    8.59 cm/s LV PW:         1.21 cm  LV E/e' medial:  17.8 LV IVS:        0.88 cm  LV e' lateral:   12.60 cm/s LVOT diam:     1.50 cm  LV E/e' lateral: 12.1 LV SV:         67 LV SV Index:   42 LVOT Area:     1.77 cm  RIGHT VENTRICLE RV Basal diam:  2.91 cm RV S prime:     22.70 cm/s TAPSE (M-mode): 3.0 cm LEFT ATRIUM             Index       RIGHT ATRIUM           Index LA diam:        4.70 cm 2.91 cm/m  RA Area:     14.60 cm LA Vol (A2C):   75.0 ml 46.47 ml/m RA Volume:   37.10 ml  22.99 ml/m LA Vol (A4C):   89.8 ml 55.64 ml/m LA Biplane Vol: 85.0 ml 52.67 ml/m  AORTIC VALVE AV Area (Vmax):    1.24 cm AV Area (Vmean):   1.26 cm AV Area (VTI):     1.31 cm AV Vmax:           250.00 cm/s AV Vmean:          175.000 cm/s AV VTI:            0.513 m AV Peak Grad:      25.0 mmHg AV Mean Grad:      14.0 mmHg LVOT Vmax:         176.00 cm/s LVOT Vmean:        125.000 cm/s LVOT VTI:          0.380 m LVOT/AV VTI ratio: 0.74  AORTA Ao Root diam: 2.70 cm Ao Asc diam:  2.70 cm MITRAL VALVE                TRICUSPID VALVE MV Area (PHT): 3.85 cm     TR Peak grad:   26.8 mmHg MV Decel Time: 197 msec     TR Vmax:        259.00 cm/s MV E velocity: 153.00 cm/s MV A velocity: 123.00 cm/s  SHUNTS MV E/A ratio:  1.24         Systemic VTI:  0.38 m                             Systemic Diam: 1.50 cm Cherlynn Kaiser MD Electronically signed by Cherlynn Kaiser MD Signature Date/Time: 07/25/2020/1:56:25 PM    Final    [place] EKG SITE RITE  Result Date: 07/26/2020 If  Site Rite image not attached, placement could not be confirmed due to current cardiac rhythm.  US Abdomen Limited RUQ (LIVER/GB)  Result Date: 07/29/2020 CLINICAL DATA:  Elevated liver function tests EXAM: ULTRASOUND ABDOMEN LIMITED RIGHT UPPER QUADRANT COMPARISON:  07/19/2020 CT FINDINGS: Gallbladder: Surgically absent Common bile duct: Diameter: Measures up to 10 mm today. On the order of 1.6 cm on the 07/19/2020 CT (when remeasured). No obstructive stone identified. Liver: Cirrhosis. No focal liver lesion. Cavernous transformation of the portal  vein, as on recent CT. Other: Small right pleural effusion. IMPRESSION: 1. Cholecystectomy with biliary duct dilatation, as on recent CT. No cause identified. 2. 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 3. Small right pleural effusion. Electronically Signed   By: Abigail Miyamoto M.D.   On: 07/29/2020 13:52      Subjective:   Discharge Exam: Vitals:   07/31/20 0407 07/31/20 0804  BP: (!) 120/48   Pulse: 81   Resp: 20   Temp: 97.6 F (36.4 C)   SpO2: 91% 91%   Vitals:   07/31/20 0000 07/31/20 0407 07/31/20 0500 07/31/20 0804  BP:  (!) 120/48    Pulse:  81    Resp:  20    Temp:  97.6 F (36.4 C)    TempSrc:      SpO2: 92% 91%  91%  Weight:   62.1 kg   Height:        General: Pt is alert, awake, not in acute distress Cardiovascular: RRR, S1/S2 +, no rubs, no gallops Respiratory: CTA bilaterally, no wheezing, no rhonchi Abdominal: Soft, NT, ND, bowel sounds + Extremities: no edema, no cyanosis   The results of significant diagnostics from this hospitalization (including imaging, microbiology, ancillary and laboratory) are listed below for reference.     Microbiology: Recent Results (from the past 240 hour(s))  Gastrointestinal Panel by PCR , Stool     Status: None   Collection Time: 07/24/20  4:51 PM   Specimen: Stool  Result Value Ref Range Status   Campylobacter species NOT DETECTED NOT DETECTED Final    Plesimonas shigelloides NOT DETECTED NOT DETECTED Final   Salmonella species NOT DETECTED NOT DETECTED Final   Yersinia enterocolitica NOT DETECTED NOT DETECTED Final   Vibrio species NOT DETECTED NOT DETECTED Final   Vibrio cholerae NOT DETECTED NOT DETECTED Final   Enteroaggregative E coli (EAEC) NOT DETECTED NOT DETECTED Final   Enteropathogenic E coli (EPEC) NOT DETECTED NOT DETECTED Final   Enterotoxigenic E coli (ETEC) NOT DETECTED NOT DETECTED Final   Shiga like toxin producing E coli (STEC) NOT DETECTED NOT DETECTED Final   Shigella/Enteroinvasive E coli (EIEC) NOT DETECTED NOT DETECTED Final   Cryptosporidium NOT DETECTED NOT DETECTED Final   Cyclospora cayetanensis NOT DETECTED NOT DETECTED Final   Entamoeba histolytica NOT DETECTED NOT DETECTED Final   Giardia lamblia NOT DETECTED NOT DETECTED Final   Adenovirus F40/41 NOT DETECTED NOT DETECTED Final   Astrovirus NOT DETECTED NOT DETECTED Final   Norovirus GI/GII NOT DETECTED NOT DETECTED Final   Rotavirus A NOT DETECTED NOT DETECTED Final   Sapovirus (I, II, IV, and V) NOT DETECTED NOT DETECTED Final    Comment: Performed at [facility], [address] [place], [place] [address]  C Difficile Quick Screen (NO PCR Reflex)     Status: None   Collection Time: 07/24/20  4:51 PM   Specimen: Stool  Result Value Ref Range Status   C Diff antigen NEGATIVE NEGATIVE Final   C Diff toxin NEGATIVE NEGATIVE Final   C Diff interpretation No C. difficile detected.  Final    Comment: Performed at [facility], [address] [place], [place] [address]     Labs: BNP (last 3 results) Recent Labs    07/26/20 0618 07/28/20 0558  BNP 216.0* 062.3*   Basic Metabolic Panel: Recent Labs  Lab 07/27/20 0513 07/28/20 0558 07/29/20 0424 07/30/20 0436 07/31/20 0556  NA 138 137 138 135 134*  K 3.5 3.7  3.5 2.6* 2.9*  CL 107 106 105 104 98  CO2 27 26 28 27 28   GLUCOSE 86 124* 70 151* 151*  BUN 20 13 12 10 10   CREATININE  0.33* <0.30* 0.36* 0.36* 0.32*  CALCIUM 7.6* 7.9* 7.9* 7.1* 7.6*  MG 1.9 1.7 1.5* 1.6* 1.6*   Liver Function Tests: Recent Labs  Lab 07/26/20 0618 07/27/20 0513 07/29/20 0424 07/29/20 1007 07/30/20 0436 07/31/20 0556  AST 59* 55* 58*  --  53* 61*  ALT 18 21 22   --  21 24  ALKPHOS 62 57 59  --  67 80  BILITOT 10.6* 9.4* 9.4* 10.3* 7.6* 7.7*  PROT 4.5* 4.4* 4.7*  --  4.4* 5.0*  ALBUMIN 2.2* 2.0* 1.9*  --  1.7* 1.8*   No results for input(s): LIPASE, AMYLASE in the last 168 hours. Recent Labs  Lab 07/26/20 0618 07/29/20 0424 07/30/20 0822  AMMONIA 13 56* 39*   CBC: Recent Labs  Lab 07/27/20 0513 07/28/20 0558 07/29/20 0424 07/30/20 0436 07/30/20 2020 07/31/20 0556  WBC 5.8 8.9 8.4 7.3  --  6.8  NEUTROABS  --   --   --  5.4  --  4.8  HGB 7.5* 8.4* 7.7* 6.9* 9.0* 8.6*  HCT 23.5* 26.8* 24.8* 21.5* 27.1* 26.7*  MCV 109.3* 110.3* 111.2* 112.0*  --  106.0*  PLT 55* 59* 51* 49*  --  57*   Cardiac Enzymes: No results for input(s): CKTOTAL, CKMB, CKMBINDEX, TROPONINI in the last 168 hours. BNP: Invalid input(s): POCBNP CBG: Recent Labs  Lab 07/30/20 1223 07/30/20 1622 07/30/20 2132 07/31/20 0757 07/31/20 1134  GLUCAP 214* 189* 184* 145* 296*   D-Dimer No results for input(s): DDIMER in the last 72 hours. Hgb A1c No results for input(s): HGBA1C in the last 72 hours. Lipid Profile No results for input(s): CHOL, HDL, LDLCALC, TRIG, CHOLHDL, LDLDIRECT in the last 72 hours. Thyroid function studies No results for input(s): TSH, T4TOTAL, T3FREE, THYROIDAB in the last 72 hours.  Invalid input(s): FREET3 Anemia work up No results for input(s): VITAMINB12, FOLATE, FERRITIN, TIBC, IRON, RETICCTPCT in the last 72 hours. Urinalysis    Component Value Date/Time   COLORURINE AMBER (A) 07/19/2020 1130   APPEARANCEUR CLOUDY (A) 07/19/2020 1130   LABSPEC 1.016 07/19/2020 1130   PHURINE 6.0 07/19/2020 1130   GLUCOSEU NEGATIVE 07/19/2020 1130   HGBUR LARGE (A)  07/19/2020 1130   BILIRUBINUR SMALL (A) 07/19/2020 1130   KETONESUR 5 (A) 07/19/2020 1130   PROTEINUR 100 (A) 07/19/2020 1130   UROBILINOGEN 0.2 04/13/2007 1545   NITRITE NEGATIVE 07/19/2020 1130   LEUKOCYTESUR SMALL (A) 07/19/2020 1130   Sepsis Labs Invalid input(s): PROCALCITONIN,  WBC,  LACTICIDVEN Microbiology Recent Results (from the past 240 hour(s))  Gastrointestinal Panel by PCR , Stool     Status: None   Collection Time: 07/24/20  4:51 PM   Specimen: Stool  Result Value Ref Range Status   Campylobacter species NOT DETECTED NOT DETECTED Final   Plesimonas shigelloides NOT DETECTED NOT DETECTED Final   Salmonella species NOT DETECTED NOT DETECTED Final   Yersinia enterocolitica NOT DETECTED NOT DETECTED Final   Vibrio species NOT DETECTED NOT DETECTED Final   Vibrio cholerae NOT DETECTED NOT DETECTED Final   Enteroaggregative E coli (EAEC) NOT DETECTED NOT DETECTED Final   Enteropathogenic E coli (EPEC) NOT DETECTED NOT DETECTED Final   Enterotoxigenic E coli (ETEC) NOT DETECTED NOT DETECTED Final   Shiga like toxin producing E coli (STEC) NOT DETECTED NOT  DETECTED Final   Shigella/Enteroinvasive E coli (EIEC) NOT DETECTED NOT DETECTED Final   Cryptosporidium NOT DETECTED NOT DETECTED Final   Cyclospora cayetanensis NOT DETECTED NOT DETECTED Final   Entamoeba histolytica NOT DETECTED NOT DETECTED Final   Giardia lamblia NOT DETECTED NOT DETECTED Final   Adenovirus F40/41 NOT DETECTED NOT DETECTED Final   Astrovirus NOT DETECTED NOT DETECTED Final   Norovirus GI/GII NOT DETECTED NOT DETECTED Final   Rotavirus A NOT DETECTED NOT DETECTED Final   Sapovirus (I, II, IV, and V) NOT DETECTED NOT DETECTED Final    Comment: Performed at [facility], [address] [place], [place] [address]  C Difficile Quick Screen (NO PCR Reflex)     Status: None   Collection Time: 07/24/20  4:51 PM   Specimen: Stool  Result Value Ref Range Status   C Diff antigen NEGATIVE  NEGATIVE Final   C Diff toxin NEGATIVE NEGATIVE Final   C Diff interpretation No C. difficile detected.  Final    Comment: Performed at [facility], [address], [place], [place] [address]   Time coordinating discharge: 38 mins  SIGNED:  Irwin Brakeman, MD  [facility] 07/31/2020, 12:59 PM How to contact the [facility] Attending or Consulting provider [place] or covering provider during after hours [place], for this patient?  1. Check the care team in [facility] and look for a) attending/consulting TRH provider listed and b) the [facility] team listed 2. Log into [email or web address] and use [place] universal password to access. If you do not have the password, please contact the hospital operator. 3. Locate the [facility] provider you are looking for under [facility] and page to a number that you can be directly reached. 4. If you still have difficulty reaching the provider, please page the [facility] (Director on Call) for the Hospitalists listed on amion for assistance.

## 2020-07-31 NOTE — TOC Transition Note (Addendum)
Transition of Care [facility]) - CM/SW Discharge Note   Patient Details  Name: [name] MRN: [id number] Date of Birth: March 14, 1966  Transition of Care [facility]) CM/SW Contact:  Natasha Bence, LCSW Phone Number: 07/31/2020, 1:26 PM   Clinical Narrative:    CSW notified of patient's readiness for discharge. CSW notified Georgina Snell with [facility] of patient's readiness for discharge. Georgina Snell agreeable provide services. CSW delivered rollator to patient's room and had patient sign [employer] form. TOC signing off.   Final next level of care: [place] Barriers to Discharge: Barriers Resolved   Patient Goals and CMS Choice Patient states their goals for this hospitalization and ongoing recovery are:: Return home with [facility] CMS Medicare.gov Compare Post Acute Care list provided to:: Patient Choice offered to / list presented to : Patient  Discharge Placement                    Patient and family notified of of transfer: 07/31/20  Discharge Plan and Services In-house Referral: Clinical Social Work   Post Acute Care Choice: [place]          DME Arranged: N/A DME Agency: NA Date DME Agency Contacted: 07/27/20     HH Arranged: PT,RN [place]: [place] Date [facility] Agency Contacted: 07/27/20   Representative spoke with at [place]: [place]) Interventions     Readmission Risk Interventions Readmission Risk Prevention Plan 07/27/2020 07/20/2020  Transportation Screening - Complete  PCP or Specialist Appt within 3-5 Days Complete -  [place] or [facility] - Complete  Social Work Consult for [place] Planning/Counseling - Complete  Palliative Care Screening - Not Applicable  Medication Review Press photographer) - Complete  Some recent data might be hidden

## 2020-08-02 ENCOUNTER — Encounter (HOSPITAL_COMMUNITY): Payer: Self-pay

## 2020-08-02 ENCOUNTER — Emergency Department (HOSPITAL_COMMUNITY)
Admission: EM | Admit: 2020-08-02 | Discharge: 2020-08-02 | Disposition: A | Discharge status: Home / Self Care | Payer: Medicare HMO | Attending: Emergency Medicine | Admitting: Emergency Medicine

## 2020-08-02 ENCOUNTER — Emergency Department (HOSPITAL_COMMUNITY): Payer: Medicare HMO

## 2020-08-02 ENCOUNTER — Other Ambulatory Visit: Payer: Self-pay

## 2020-08-02 DIAGNOSIS — E1140 Type 2 diabetes mellitus with diabetic neuropathy, unspecified: Secondary | ICD-10-CM | POA: Insufficient documentation

## 2020-08-02 DIAGNOSIS — Z8616 Personal history of COVID-19: Secondary | ICD-10-CM | POA: Insufficient documentation

## 2020-08-02 DIAGNOSIS — Z7984 Long term (current) use of oral hypoglycemic drugs: Secondary | ICD-10-CM | POA: Diagnosis not present

## 2020-08-02 DIAGNOSIS — I10 Essential (primary) hypertension: Secondary | ICD-10-CM | POA: Diagnosis not present

## 2020-08-02 DIAGNOSIS — R059 Cough, unspecified: Secondary | ICD-10-CM | POA: Diagnosis not present

## 2020-08-02 DIAGNOSIS — R0600 Dyspnea, unspecified: Secondary | ICD-10-CM | POA: Diagnosis not present

## 2020-08-02 DIAGNOSIS — G8929 Other chronic pain: Secondary | ICD-10-CM | POA: Insufficient documentation

## 2020-08-02 DIAGNOSIS — E1110 Type 2 diabetes mellitus with ketoacidosis without coma: Secondary | ICD-10-CM | POA: Insufficient documentation

## 2020-08-02 DIAGNOSIS — K7460 Unspecified cirrhosis of liver: Secondary | ICD-10-CM | POA: Diagnosis not present

## 2020-08-02 DIAGNOSIS — E119 Type 2 diabetes mellitus without complications: Secondary | ICD-10-CM | POA: Insufficient documentation

## 2020-08-02 DIAGNOSIS — R0602 Shortness of breath: Secondary | ICD-10-CM

## 2020-08-02 DIAGNOSIS — R109 Unspecified abdominal pain: Secondary | ICD-10-CM | POA: Diagnosis present

## 2020-08-02 DIAGNOSIS — Z79899 Other long term (current) drug therapy: Secondary | ICD-10-CM | POA: Diagnosis not present

## 2020-08-02 DIAGNOSIS — Z794 Long term (current) use of insulin: Secondary | ICD-10-CM | POA: Diagnosis not present

## 2020-08-02 DIAGNOSIS — J449 Chronic obstructive pulmonary disease, unspecified: Secondary | ICD-10-CM | POA: Insufficient documentation

## 2020-08-02 LAB — CBC
HCT: 28.9 % — ABNORMAL LOW (ref 36.0–46.0)
Hemoglobin: 9.4 g/dL — ABNORMAL LOW (ref 12.0–15.0)
MCH: 34.1 pg — ABNORMAL HIGH (ref 26.0–34.0)
MCHC: 32.5 g/dL (ref 30.0–36.0)
MCV: 104.7 fL — ABNORMAL HIGH (ref 80.0–100.0)
Platelets: 63 10*3/uL — ABNORMAL LOW (ref 150–400)
RBC: 2.76 MIL/uL — ABNORMAL LOW (ref 3.87–5.11)
RDW: 18.5 % — ABNORMAL HIGH (ref 11.5–15.5)
WBC: 5.3 10*3/uL (ref 4.0–10.5)
nRBC: 0 % (ref 0.0–0.2)

## 2020-08-02 LAB — COMPREHENSIVE METABOLIC PANEL
ALT: 24 U/L (ref 0–44)
AST: 65 U/L — ABNORMAL HIGH (ref 15–41)
Albumin: 1.9 g/dL — ABNORMAL LOW (ref 3.5–5.0)
Alkaline Phosphatase: 91 U/L (ref 38–126)
Anion gap: 3 — ABNORMAL LOW (ref 5–15)
BUN: 9 mg/dL (ref 6–20)
CO2: 27 mmol/L (ref 22–32)
Calcium: 7.9 mg/dL — ABNORMAL LOW (ref 8.9–10.3)
Chloride: 102 mmol/L (ref 98–111)
Creatinine, Ser: 0.48 mg/dL (ref 0.44–1.00)
GFR, Estimated: >60 mL/min (ref >60)
Glucose, Bld: 286 mg/dL — ABNORMAL HIGH (ref 70–99)
Potassium: 3.6 mmol/L (ref 3.5–5.1)
Sodium: 132 mmol/L — ABNORMAL LOW (ref 135–145)
Total Bilirubin: 7.7 mg/dL — ABNORMAL HIGH (ref 0.3–1.2)
Total Protein: 6 g/dL — ABNORMAL LOW (ref 6.5–8.1)

## 2020-08-02 LAB — AMMONIA: Ammonia: 73 umol/L — ABNORMAL HIGH (ref 9–35)

## 2020-08-02 LAB — LIPASE, BLOOD: Lipase: 42 U/L (ref 11–51)

## 2020-08-02 LAB — I-STAT BETA HCG BLOOD, ED (MC, WL, AP ONLY): I-stat hCG, quantitative: <5 m[IU]/mL (ref <5)

## 2020-08-02 NOTE — ED Provider Notes (Signed)
MSE was initiated and I personally evaluated the patient and placed orders (if any) at  2:01 AM on Aug 02, 2020.  Patient here with CC of back pain and abdominal pain.  Discharged on 5/13 after having been admitted for hepatic encephalopathy and ARF.  She is DNR/DNI.  ROS: As listed above  PE: Alert and oriented Answers questions appropriately No respiratory distress Generalized abdominal tenderness  Discussed with patient that their care has been initiated.   They are counseled that they will need to remain in the ED until the completion of their workup, including full H&P and results of any tests.  Risks of leaving the emergency department prior to completion of treatment were discussed. Patient was advised to inform ED staff if they are leaving before their treatment is complete. The patient acknowledged these risks and time was allowed for questions.    The patient appears stable so that the remainder of the MSE may be completed by another provider.    Montine Circle, PA-C 08/02/20 [id number]    Ripley Fraise, MD 08/02/20 [phone]

## 2020-08-02 NOTE — Discharge Instructions (Addendum)
Be sure to take your lactulose every day and have at least 3 stools per day.

## 2020-08-02 NOTE — ED Provider Notes (Signed)
[facility] EMERGENCY DEPARTMENT Provider Note   CSN: [id number] Arrival date & time: 08/02/20  0150     History Chief Complaint  Patient presents with  . Abdominal Pain    [name] is a 55 y.o. female.  HPI    Patient w/ history of cirrhosis, diabetes, chronic pancreatitis presents for reevaluation after recent hospitalization.  Patient was admitted for hepatic encephalopathy, multifocal pneumonia and electrolyte abnormalities.  She was discharged on her home oxygen.  She reports over the past day she had increasing shortness of breath.  Patient mitts that her abdominal pain is chronic and not new.  She reports persistent cough.  No chest pain.  No fevers or vomiting.  Her course is stable.  Nothing worsens her symptoms Patient is DNR/DNI Past Medical History:  Diagnosis Date  . Degenerative joint disease   . Diabetes mellitus without complication ([place])   . Gastroesophageal reflux   . Pancreatitis   . Panic attack   . Sleep apnea   . Thrombocytopenia [facility])     Patient Active Problem List   Diagnosis Date Noted  . Hypoxia   . Acute hypoxemic respiratory failure ([place])   . Pressure injury of skin 07/28/2020  . Malnutrition of moderate degree 07/21/2020  . Altered mental status 07/19/2020  . Sepsis ([place]) 07/19/2020  . Acute lower UTI 07/19/2020  . DNR (do not resuscitate) 07/19/2020  . Pancreatic insufficiency 07/19/2020  . Chronic liver failure without hepatic coma ([place])   . Physical deconditioning   . Hepatic encephalopathy ([place]) 05/21/2020  . Anemia   . Coagulopathy ([place])   . Ascites 03/30/2020  . Abnormal weight gain 03/30/2020  . Severe recurrent major depressive disorder with psychotic features ([place]) 10/05/2019  . Major depressive disorder, single episode, severe with psychotic features ([place]) 10/04/2019  . Abnormal head CT 04/02/2019  . Multiple falls 04/02/2019  . Stroke-like symptoms 04/02/2019  . Panic attack 03/30/2019  .  History of COPD 03/20/2019  . Chronic abdominal pain 02/05/2019  . DKA (diabetic ketoacidoses) 01/26/2019  . Sleep apnea   . Gastroesophageal reflux   . Hepatitis A 01/13/2019  . 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 COVID-19 01/12/2019  . Glycosuria 01/12/2019  . Serum total bilirubin elevated 01/12/2019  . Hypoalbuminemia 01/12/2019  . Normocytic anemia, not due to blood loss 01/12/2019  . Uncontrolled type 2 diabetes mellitus with hyperglycemia, with long-term current use of insulin ([place]) 01/12/2019  . Low TSH level 01/12/2019  . Chest pain 12/16/2018  . Left-sided weakness 12/16/2018  . Weight loss 12/16/2018  . Schizoaffective disorder ([place]) 03/10/2018  . Essential hypertension 03/09/2018  . H/O partial thyroidectomy 10/25/2017  . Thyroid nodule 10/25/2017  . Cricopharyngeal hypertrophy 09/26/2017  . Pharyngoesophageal dysphagia 09/26/2017  . Reaction to severe stress 09/11/2017  . Left hemiparesis ([place]) 04/18/2017  . Disorder of shoulder 03/03/2017  . Lactic acidosis 09/27/2016  . Pancytopenia ([place]) 09/27/2016  . Acquired hypothyroidism 09/27/2016  . Microalbuminuria due to type 2 diabetes mellitus ([place]) 04/20/2016  . Diabetic peripheral neuropathy associated with type 2 diabetes mellitus ([place]) 12/03/2015  . Hepatic steatosis 12/03/2015  . Diabetes mellitus without complication ([place]) 17/51/0258  . Moderate episode of recurrent major depressive disorder ([place]) 03/20/2015  . Complicated UTI (urinary tract infection) 12/25/2014  . Vaginitis and vulvovaginitis 11/06/2014  . Blurry vision, bilateral 10/17/2014  . Health care maintenance 10/17/2014  . Nausea 10/17/2014  . Multinodular thyroid 07/01/2014  . Cirrhosis ([place]) 03/05/2014  . Postoperative  hypothyroidism 03/05/2014  . Type 2 diabetes mellitus ([place]) 03/05/2014  . Thrombocytopenia ([place]) 01/17/2014  . Alcoholic cirrhosis of liver without ascites ([place]) 01/17/2014  . Back problem 05/28/2013   . Low back pain 04/12/2013  . Borderline personality disorder ([place]) 08/25/2012  . Depression 08/25/2012  . Hypercholesterolemia 08/25/2012  . Osteoarthrosis 08/25/2012  . Gastro-esophageal reflux disease without esophagitis 08/25/2012  . Obstructive sleep apnea 08/25/2012    Past Surgical History:  Procedure Laterality Date  . CHOLECYSTECTOMY    . SHOULDER SURGERY     left   . tongue sx. titanium screw placed to hold tongue down    . TOTAL THYROIDECTOMY       OB History   No obstetric history on file.     History reviewed. No pertinent family history.  Social History   Tobacco Use  . Smoking status: Never Smoker  . Smokeless tobacco: Never Used  Vaping Use  . Vaping Use: Never used  Substance Use Topics  . Alcohol use: No  . Drug use: No    Home Medications Prior to Admission medications   Medication Sig Start Date End Date Taking? Authorizing Provider  albuterol (PROVENTIL) (2.5 MG/3ML) 0.083% nebulizer solution Inhale 2.5 mg into the lungs every 6 (six) hours as needed for wheezing or shortness of breath.  04/19/19   [provider]  albuterol (VENTOLIN HFA) 108 (90 Base) MCG/ACT inhaler Inhale 2 puffs into the lungs every 4 (four) hours as needed for wheezing or shortness of breath. 04/02/20   Roxan Hockey, MD  Continuous Blood Gluc Sensor (DEXCOM G6 SENSOR) MISC Inject into the skin See admin instructions. Place a new sensor under the skin every 10 days    [provider]  furosemide (LASIX) 20 MG tablet Take 20 mg by mouth in the morning and at bedtime.    [provider]  insulin aspart (NOVOLOG) 100 UNIT/ML injection Via Insulin Pump 07/31/20   Johnson, Clanford L, MD  Insulin Human (INSULIN PUMP) SOLN Inject into the skin continuous.    [provider]  lactulose (CHRONULAC) 10 GM/15ML solution Take 45 mLs (30 g total) by mouth 3 (three) times daily. Take 30 grams (45 ml's) by mouth three to five times a day until 3 bowel  movements a day are achieved, then as directed 07/31/20   Murlean Iba, MD  levothyroxine (SYNTHROID, LEVOTHROID) 50 MCG tablet Take 50 mcg by mouth daily before breakfast.  07/25/14   [provider]  lipase/protease/amylase (CREON) 12000-38000 units CPEP capsule Take 4 capsules (48,000 Units total) by mouth 3 (three) times daily before meals. 07/31/20   Johnson, Clanford L, MD  NITROGLYCERIN PO Place 1 tablet under the tongue every 5 (five) minutes as needed (as directed, for chest pain).    [provider]  ondansetron (ZOFRAN ODT) 4 MG disintegrating tablet Take 1 tablet (4 mg total) by mouth See admin instructions. Dissolve 4 mg orally one to two times a day 07/31/20   Irwin Brakeman L, MD  OXYGEN Inhale 2-4 L/min into the lungs as needed (for shortness of breath).    [provider]  Vitamin D, Ergocalciferol, (DRISDOL) 1.25 MG (50000 UNIT) CAPS capsule Take 1 capsule by mouth once a week. 06/29/20   [provider]  XIFAXAN 550 MG TABS tablet Take 550 mg by mouth 2 (two) times daily. 05/28/20   [provider]  lisinopril (ZESTRIL) 10 MG tablet Take 10 mg by mouth daily. Patient not taking: Reported on 10/02/2019  03/29/19 10/02/19  [provider]  metFORMIN (GLUCOPHAGE) 1000 MG tablet Take 1,000 mg by mouth 2 (two) times daily with a meal.  Patient not taking: Reported on 10/02/2019  10/02/19  [provider]  metoCLOPramide (REGLAN) 10 MG tablet Take 10 mg by mouth 4 (four) times daily.  Patient not taking: Reported on 10/02/2019  10/02/19  [provider]  sertraline (ZOLOFT) 100 MG tablet Take 150 mg by mouth daily. Patient not taking: Reported on 10/02/2019 09/10/16 10/02/19  [provider]  spironolactone (ALDACTONE) 50 MG tablet Take 50 mg by mouth daily. Patient not taking: Reported on 10/02/2019 03/27/19 10/02/19  [provider]  traZODone (DESYREL) 100 MG tablet Take 1 tablet by mouth daily. Patient  not taking: Reported on 10/02/2019 09/15/16 10/02/19  [provider]    Allergies    Acetaminophen, Cephalexin, Ciprofloxacin, Hydromorphone, Sulfamethoxazole-trimethoprim, and Abilify [aripiprazole]  Review of Systems   Review of Systems  Constitutional: Positive for fatigue. Negative for fever.  Respiratory: Positive for cough and shortness of breath.   Cardiovascular: Negative for chest pain.  Gastrointestinal:       Chronic abdominal pain  All other systems reviewed and are negative.   Physical Exam Updated Vital Signs BP (!) 136/55   Pulse 83   Temp 98.8 F (37.1 C) (Oral)   Resp (!) 24   SpO2 90%   Physical Exam  CONSTITUTIONAL: Chronically ill-appearing HEAD: Normocephalic/atraumatic EYES: EOMI/PERRL ENMT: Mucous membranes moist NECK: supple no meningeal signs SPINE/BACK:entire spine nontender CV: S1/S2 noted, no murmurs/rubs/gallops noted LUNGS: Lungs are clear to auscultation bilaterally, no apparent distress ABDOMEN: soft, nontender, no rebound or guarding, bowel sounds noted throughout abdomen, nondistended GU:no cva tenderness NEURO: Pt is awake/alert/appropriate, moves all extremitiesx4.  No facial droop.   EXTREMITIES: pulses normal/equal, full ROM SKIN: warm, color normal PSYCH: no abnormalities of mood noted, alert and oriented to situation  ED Results / Procedures / Treatments   Labs (all labs ordered are listed, but only abnormal results are displayed) Labs Reviewed  COMPREHENSIVE METABOLIC PANEL - Abnormal; Notable for the following components:      Result Value   Sodium 132 (*)    Glucose, Bld 286 (*)    Calcium 7.9 (*)    Total Protein 6.0 (*)    Albumin 1.9 (*)    AST 65 (*)    Total Bilirubin 7.7 (*)    Anion gap 3 (*)    All other components within normal limits  CBC - Abnormal; Notable for the following components:   RBC 2.76 (*)    Hemoglobin 9.4 (*)    HCT 28.9 (*)    MCV 104.7 (*)    MCH 34.1 (*)    RDW 18.5 (*)     Platelets 63 (*)    All other components within normal limits  AMMONIA - Abnormal; Notable for the following components:   Ammonia 73 (*)    All other components within normal limits  LIPASE, BLOOD  I-STAT BETA HCG BLOOD, ED ([facility], [facility], [facility] ONLY)    EKG EKG Interpretation  Date/Time:  Sunday Aug 02 2020 02:04:20 EDT Ventricular Rate:  88 PR Interval:  98 QRS Duration: 100 QT Interval:  360 QTC Calculation: 435 R Axis:   107 Text Interpretation: Sinus rhythm with short PR Incomplete right bundle branch block Possible Right ventricular hypertrophy ST & T wave abnormality, consider inferolateral ischemia Abnormal ECG Confirmed by Ripley Fraise [phone]) on 08/02/2020 2:52:52 AM   Radiology DG Chest  2 View  Result Date: 08/02/2020 CLINICAL DATA:  Recent hospital discharge for pneumonia. Shortness of breath. EXAM: CHEST - 2 VIEW COMPARISON:  Radiograph 07/28/2020.  CT 07/19/2020 FINDINGS: Right upper extremity PICC is been removed. Persistent but improved bilateral patchy upper lobe opacities. Significant improvement in bibasilar opacities with clearing of pleural fluid. Trace blunting of both costophrenic angles may represent small residual. No new airspace disease. Upper normal heart size. Aortic atherosclerosis IMPRESSION: 1. Persistent but improved bilateral upper lobe opacities consistent with resolving pneumonia. 2. Significant improvement in bibasilar opacities and pleural effusions. Electronically Signed   By: Keith Rake M.D.   On: 08/02/2020 02:42    Procedures Procedures   Medications Ordered in ED Medications - No data to display  ED Course  I have reviewed the triage vital signs and the nursing notes.  Pertinent labs & imaging results that were available during my care of the patient were reviewed by me and considered in my medical decision making (see chart for details).    MDM Rules/Calculators/A&P                          Patient with history of cirrhosis  presents for reevaluation after recent admission.  Patient was mostly concerned that she felt more short of breath despite using her home oxygen.  Fortunately her x-ray is actually improved.  When I entered the room her room air pulse ox was 95% and she was in no acute distress. She denies any chest pain. EKG was abnormal, though similar to previous EKG She denied any new abdominal pain aside in her chronic pain. Patient would like to be discharged.  Her ammonia level was elevated, but she reports taking lactulose and having at least 3 bowel movements per day.  I encouraged her to continue this Patient will be discharged home Final Clinical Impression(s) / ED Diagnoses Final diagnoses:  Cirrhosis of liver without ascites, unspecified hepatic cirrhosis type ([place])  Dyspnea, unspecified type    Rx / DC Orders ED Discharge Orders    None       Ripley Fraise, MD 08/02/20 [phone]

## 2020-08-02 NOTE — ED Triage Notes (Signed)
Pt was recently discharged from hospital  With a diagnosis of pneumonia and abdominal pain has worsened.

## 2020-08-13 ENCOUNTER — Telehealth: Payer: Self-pay

## 2020-08-13 NOTE — Telephone Encounter (Signed)
Spoke with patient and scheduled an in-person Palliative Consult for 08/28/20 @ 9:30AM  COVID screening was negative. No pets in home.   Consent obtained; updated Outlook/Netsmart/Team List and Epic.   Family is aware they may be receiving a call from NP the day before or day of to confirm appointment.

## 2020-08-28 ENCOUNTER — Other Ambulatory Visit: Payer: Self-pay

## 2020-08-28 ENCOUNTER — Other Ambulatory Visit: Payer: Medicaid Other | Admitting: Nurse Practitioner

## 2020-08-28 VITALS — BP 130/40 | HR 78 | Resp 18

## 2020-08-28 DIAGNOSIS — G894 Chronic pain syndrome: Secondary | ICD-10-CM

## 2020-08-28 DIAGNOSIS — E108 Type 1 diabetes mellitus with unspecified complications: Secondary | ICD-10-CM

## 2020-08-28 DIAGNOSIS — L299 Pruritus, unspecified: Secondary | ICD-10-CM

## 2020-08-28 DIAGNOSIS — Z515 Encounter for palliative care: Secondary | ICD-10-CM

## 2020-08-28 DIAGNOSIS — K7460 Unspecified cirrhosis of liver: Secondary | ICD-10-CM

## 2020-08-28 DIAGNOSIS — K861 Other chronic pancreatitis: Secondary | ICD-10-CM

## 2020-08-28 NOTE — Progress Notes (Signed)
[facility] Palliative Care Consult Note Telephone: [phone]  Fax: [phone]    Date of encounter: 08/28/20 PATIENT NAME: [name] [address] [place] [address]   [phone] (home)  DOB: 19-Feb-1966 MRN: [id number]  PRIMARY CARE PROVIDER:    Center, [facility],  [place] [address] [phone]  REFERRING PROVIDER:   Center, [facility] [address] [place],  [place] [address] [phone]  RESPONSIBLE PARTY:    Contact Information     Name Relation Home Work Mobile   [place],Samuel Significant other [phone]  [phone]     I met face to face with patient in home. Significant other Samue present during visit.  Palliative Care was asked to follow this patient by consultation request of  Center, [facility] to address advance care planning and complex medical decision making. This is the initial visit.                                   ASSESSMENT AND PLAN / RECOMMENDATIONS:   Advance Care Planning/Goals of Care: Goals include to maximize quality of life and symptom management. Our advance care planning conversation included a discussion about:    The value and importance of advance care planning  Experiences with loved ones who have been seriously ill or have died  Exploration of personal, cultural or spiritual beliefs that might influence medical decisions  Exploration of goals of care in the event of a sudden injury or illness  Identification and preparation of a healthcare agent  Review and updating or creation of an  advance directive document . Decision not to resuscitate or to de-escalate disease focused treatments due to poor prognosis. CODE STATUS: DNR Goal of care: Goal of care is comfort while preserving function. Patient verbalized desire to fight and keep pushing as she is not ready to give up on maintaining her function. Directives: Patient reiterated decision to not be  resuscitated in the event of cardiac or respiratory arrest. Patient has a signed DNR form in home. The need to complete a MOST form was discussed, patient verbalized interest in completing a MOST form today. Discussed and reviewed sections of the form in detail, opportunity for questions given, all questions answered. Form completed and signed with patient, signed form given to patient to keep in home, copy of MOST and DNR forms uploaded to [facility] EMR. Palliative care will continue to provide support to patient, family and the medical team.  I spent 30 minutes providing this consultation. More than 50% of the time in this consultation was spent in counseling and care coordination. ---------------------------------------------------------------------------------  Symptom Management/Plan: Purities: Purities secondary to liver disease. Patient at risk for sedation due to liver disease. Encourage use of lotion and emollient, consider use of anti-itch creams and lotion. Liver Cirrhosis: No report of acute bleed, no evidence of ascites. Patient at risk for rise in Ammonia level. Continue Lactulose and Xifaxan as prescribed. Encouraged consistent use of Lactulose to prevent hospitalization due to altered mental status change. Discussed how increase in ammonia leads to change in mental status. Patient verbalized understanding.  Chronic Pancreatitis: Patient denied uncontrolled diarrhea. Continue Creon as prescribed.  Chronic pain syndrome: Denied pain during visit today. Continue current regimen with Oxycodone 87m every 6hrs as needed. Type 2 diabetes: A1c 5.8 on 06/30/2020. Continue current plan of care, follow up with Endocrinology as scheduled.  Follow  up Palliative Care Visit: Palliative care will continue to follow for complex medical decision making, advance care planning, and clarification of goals. Return 6 weeks or prn.  PPS: 60% weak  HOSPICE ELIGIBILITY/DIAGNOSIS: TBD  Chief Complaint:  generalized itching  History obtained from review of Epic EMR, discussion with Ms. Dondlinger and her significant other.  HISTORY OF PRESENT ILLNESS:  [name] is a 55 y.o. year old female  with liver cirrhosis, insulin-dependent diabetes on insulin pump,(A1c 5.8 on 06/30/2020), chronic pancreatitis, thrombocytopenia, hypothyroidism, HTN. Patient with hx of frequent hospital visit, she has had 12 ED visits in the last 6 months. Most of her ED visits are related to complication of her liver disease, she is deemed not a good surgical candidate for a liver transplant.  Patient complained of generalized purities in the context of liver disease. Purities not associated with rash or any leision. Report condition is ongoing and worsening. Patient report using hydroxyzine in the past with some relief but was discontinued during one of her ED visit due to sedation. Patient lives with her significant other of 12 years who is also her POA. She is independent with her ADLs , ambulates with a Walker outside her home, no report of recent falls. She report feeling well today, denied fever, denied chills.   I reviewed available labs, medications, imaging, studies and related documents from the EMR.  Records reviewed and summarized above.   ROS EYES: denies acute vision changes ENMT: denies dysphagia Cardiovascular: denies chest pain, denies DOE Pulmonary: denies cough, denies increased SOB Abdomen: endorses fair appetite, denies constipation, endorses continence of bowel GU: denies dysuria, endorses continence of urine MSK:  endorsed weakness, no falls reported Skin: denies rashes or wounds Neurological: denies uncontrolled pain, denies insomnia Psych: Endorses positive mood Heme/lymph/immuno: denies bruises, abnormal bleeding  Physical Exam: Constitutional: NAD General: frail appearing, thin, sitting in her couch in NAD EYES: anicteric sclera, no discharge  ENMT: intact hearing, oral mucous  membranes moist CV: S1S2 normal, no LE edema Pulmonary: LCTA, no increased work of breathing, no cough, room air Abdomen: soft and non tender, no ascites GU: deferred MSK: sarcopenia, moves all extremities, ambulatory Skin: warm and dry, no rashes or wounds on visible skin Neuro: generalized weakness, no cognitive impairment Psych: non-anxious affect, A and O x 3 Hem/lymph/immuno: no widespread bruising  CURRENT PROBLEM LIST:  Patient Active Problem List   Diagnosis Date Noted   Hypoxia    Acute hypoxemic respiratory failure (HCC)    Pressure injury of skin 07/28/2020   Malnutrition of moderate degree 07/21/2020   Altered mental status 07/19/2020   Sepsis ([place]) 07/19/2020   Acute lower UTI 07/19/2020   DNR (do not resuscitate) 07/19/2020   Pancreatic insufficiency 07/19/2020   Chronic liver failure without hepatic coma ([place])    Physical deconditioning    Hepatic encephalopathy ([place]) 05/21/2020   Anemia    Coagulopathy (HCC)    Ascites 03/30/2020   Abnormal weight gain 03/30/2020   Severe recurrent major depressive disorder with psychotic features ([place]) 10/05/2019   Major depressive disorder, single episode, severe with psychotic features ([place]) 10/04/2019   Abnormal head CT 04/02/2019   Multiple falls 04/02/2019   Stroke-like symptoms 04/02/2019   Panic attack 03/30/2019   History of COPD 03/20/2019   Chronic abdominal pain 02/05/2019   DKA (diabetic ketoacidoses) 01/26/2019   Sleep apnea    Gastroesophageal reflux    Hepatitis A 01/13/2019   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[place]) 01/12/2019  COVID-19 01/12/2019   Glycosuria 01/12/2019   Serum total bilirubin elevated 01/12/2019   Hypoalbuminemia 01/12/2019   Normocytic anemia, not due to blood loss 01/12/2019   Uncontrolled type 2 diabetes mellitus with hyperglycemia, with long-term current use of insulin ([place]) 01/12/2019   Low TSH level 01/12/2019   Chest pain 12/16/2018   Left-sided weakness 12/16/2018    Weight loss 12/16/2018   Schizoaffective disorder ([place]) 03/10/2018   Essential hypertension 03/09/2018   H/O partial thyroidectomy 10/25/2017   Thyroid nodule 10/25/2017   Cricopharyngeal hypertrophy 09/26/2017   Pharyngoesophageal dysphagia 09/26/2017   Reaction to severe stress 09/11/2017   Left hemiparesis ([place]) 04/18/2017   Disorder of shoulder 03/03/2017   Lactic acidosis 09/27/2016   Pancytopenia ([place]) 09/27/2016   Acquired hypothyroidism 09/27/2016   Microalbuminuria due to type 2 diabetes mellitus ([place]) 04/20/2016   Diabetic peripheral neuropathy associated with type 2 diabetes mellitus ([place]) 12/03/2015   Hepatic steatosis 12/03/2015   Diabetes mellitus without complication ([place]) 15/94/5859   Moderate episode of recurrent major depressive disorder ([place]) 29/24/4628   Complicated UTI (urinary tract infection) 12/25/2014   Vaginitis and vulvovaginitis 11/06/2014   Blurry vision, bilateral 10/17/2014   Health care maintenance 10/17/2014   Nausea 10/17/2014   Multinodular thyroid 07/01/2014   Cirrhosis ([place]) 03/05/2014   Postoperative hypothyroidism 03/05/2014   Type 2 diabetes mellitus ([place]) 03/05/2014   Thrombocytopenia ([place]) 63/81/7711   Alcoholic cirrhosis of liver without ascites ([place]) 01/17/2014   Back problem 05/28/2013   Low back pain 04/12/2013   Borderline personality disorder ([place]) 08/25/2012   Depression 08/25/2012   Hypercholesterolemia 08/25/2012   Osteoarthrosis 08/25/2012   Gastro-esophageal reflux disease without esophagitis 08/25/2012   Obstructive sleep apnea 08/25/2012   PAST MEDICAL HISTORY:  Active Ambulatory Problems    Diagnosis Date Noted   DKA (diabetic ketoacidoses) 01/26/2019   Diabetes mellitus without complication ([place]) 65/79/0383   Sleep apnea    Gastroesophageal reflux    Panic attack 03/30/2019   Thrombocytopenia ([place]) 01/17/2014   Abnormal head CT 04/02/2019   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[place]) 01/12/2019   Back  problem 05/28/2013   Borderline personality disorder ([place]) 08/25/2012   Blurry vision, bilateral 33/83/2919   Alcoholic cirrhosis of liver without ascites ([place]) 01/17/2014   Chronic abdominal pain 02/05/2019   Chest pain 12/16/2018   Cirrhosis ([place]) 16/60/6004   Complicated UTI (urinary tract infection) 12/25/2014   COVID-19 01/12/2019   Cricopharyngeal hypertrophy 09/26/2017   Diabetic peripheral neuropathy associated with type 2 diabetes mellitus ([place]) 12/03/2015   Depression 08/25/2012   Pharyngoesophageal dysphagia 09/26/2017   Disorder of shoulder 03/03/2017   Essential hypertension 03/09/2018   Glycosuria 01/12/2019   H/O partial thyroidectomy 10/25/2017   Health care maintenance 10/17/2014   Hepatic steatosis 12/03/2015   Hepatitis A 01/13/2019   History of COPD 03/20/2019   Serum total bilirubin elevated 01/12/2019   Hypercholesterolemia 08/25/2012   Hypoalbuminemia 01/12/2019   Lactic acidosis 09/27/2016   Left hemiparesis ([place]) 04/18/2017   Microalbuminuria due to type 2 diabetes mellitus ([place]) 04/20/2016   Low back pain 04/12/2013   Left-sided weakness 12/16/2018   Moderate episode of recurrent major depressive disorder ([place]) 03/20/2015   Multinodular thyroid 07/01/2014   Multiple falls 04/02/2019   Nausea 10/17/2014   Normocytic anemia, not due to blood loss 01/12/2019   Osteoarthrosis 08/25/2012   Pancytopenia ([place]) 09/27/2016   Acquired hypothyroidism 09/27/2016   Postoperative hypothyroidism 03/05/2014   Reaction to severe stress 09/11/2017   Schizoaffective disorder ([place]) 03/10/2018  Stroke-like symptoms 04/02/2019   Thyroid nodule 10/25/2017   Uncontrolled type 2 diabetes mellitus with hyperglycemia, with long-term current use of insulin ([place]) 01/12/2019   Vaginitis and vulvovaginitis 11/06/2014   Weight loss 12/16/2018   Type 2 diabetes mellitus ([place]) 03/05/2014   Gastro-esophageal reflux disease without esophagitis 08/25/2012   Obstructive sleep  apnea 08/25/2012   Low TSH level 01/12/2019   Major depressive disorder, single episode, severe with psychotic features ([place]) 10/04/2019   Severe recurrent major depressive disorder with psychotic features ([place]) 10/05/2019   Ascites 03/30/2020   Abnormal weight gain 03/30/2020   Anemia    Coagulopathy (HCC)    Hepatic encephalopathy ([place]) 05/21/2020   Chronic liver failure without hepatic coma (HCC)    Physical deconditioning    Altered mental status 07/19/2020   Sepsis ([place]) 07/19/2020   Acute lower UTI 07/19/2020   DNR (do not resuscitate) 07/19/2020   Pancreatic insufficiency 07/19/2020   Malnutrition of moderate degree 07/21/2020   Pressure injury of skin 07/28/2020   Acute hypoxemic respiratory failure ([place])    Hypoxia    Resolved Ambulatory Problems    Diagnosis Date Noted   Newly diagnosed infection due to multidrug resistant organism 05/14/2017   Pyelonephritis 09/27/2016   Past Medical History:  Diagnosis Date   Degenerative joint disease    Pancreatitis    SOCIAL HX:  Social History   Tobacco Use   Smoking status: Never   Smokeless tobacco: Never  Substance Use Topics   Alcohol use: No   FAMILY HX: No family history on file.    ALLERGIES:  Allergies  Allergen Reactions   Acetaminophen Hives and Other (See Comments)    Tolerates Fioricet   Cephalexin Itching, Swelling and Other (See Comments)    Tongue swells and makes throat itchy- can tolerate Zosyn   Ciprofloxacin Hives   Hydromorphone Hives   Sulfamethoxazole-Trimethoprim Shortness Of Breath, Swelling and Other (See Comments)    Tongue swells    Abilify [Aripiprazole]     "I did noyt like the way this made me feel."     PERTINENT MEDICATIONS:   albuterol (PROVENTIL) (2.5 MG/3ML) 0.083% nebulizer solution Inhale 2.5 mg into the lungs every 6 (six) hours as needed for wheezing or shortness of breath.    albuterol (VENTOLIN HFA) 108 (90 Base) MCG/ACT inhaler Inhale 2 puffs into the lungs every 4  (four) hours as needed for wheezing or shortness of breath.   Continuous Blood Gluc Sensor (DEXCOM G6 SENSOR) MISC Inject into the skin See admin instructions. Place a new sensor under the skin every 10 days   furosemide (LASIX) 20 MG tablet Take 20 mg by mouth in the morning and at bedtime.   insulin aspart (NOVOLOG) 100 UNIT/ML injection Via Insulin Pump   Insulin Human (INSULIN PUMP) SOLN Inject into the skin continuous.   lactulose (CHRONULAC) 10 GM/15ML solution Take 45 mLs (30 g total) by mouth 3 (three) times daily. Take 30 grams (45 ml's) by mouth three to five times a day until 3 bowel movements a day are achieved, then as directed   levothyroxine (SYNTHROID, LEVOTHROID) 50 MCG tablet Take 50 mcg by mouth daily before breakfast.    lipase/protease/amylase (CREON) 12000-38000 units CPEP capsule Take 4 capsules (48,000 Units total) by mouth 3 (three) times daily before meals.   Magnesium 400 MG TABS Take 400 mg by mouth daily.   NITROGLYCERIN PO Place 1 tablet under the tongue every 5 (five) minutes as needed (as directed, for chest pain).  ondansetron (ZOFRAN ODT) 4 MG disintegrating tablet Take 1 tablet (4 mg total) by mouth See admin instructions. Dissolve 4 mg orally one to two times a day   Oxycodone HCl 10 MG TABS Take 10 mg by mouth every 6 (six) hours as needed (pain).   OXYGEN Inhale 2-4 L/min into the lungs as needed (for shortness of breath).   potassium chloride SA (KLOR-CON) 20 MEQ tablet Take 20 mEq by mouth 2 (two) times daily.   Vitamin D, Ergocalciferol, (DRISDOL) 1.25 MG (50000 UNIT) CAPS capsule Take 50,000 Units by mouth every Monday.   XIFAXAN 550 MG TABS tablet Take 550 mg by mouth 2 (two) times daily.   Thank you for the opportunity to participate in the care of Ms. Jerilee Hoh.  The palliative care team will continue to follow. Please call our office at [phone] if we can be of additional assistance.   Shalayne Leach DNP, AGPCNP-BC  COVID-19 PATIENT SCREENING  TOOL Asked and negative response unless otherwise noted:   Have you had symptoms of covid, tested positive or been in contact with someone with symptoms/positive test in the past 5-10 days?

## 2020-10-16 ENCOUNTER — Other Ambulatory Visit: Payer: Medicaid Other | Admitting: *Deleted

## 2020-10-16 ENCOUNTER — Other Ambulatory Visit: Payer: Medicaid Other

## 2020-10-16 ENCOUNTER — Other Ambulatory Visit: Payer: Self-pay

## 2020-10-16 VITALS — BP 131/54 | HR 78 | Temp 98.4°F | Resp 18

## 2020-10-16 DIAGNOSIS — Z515 Encounter for palliative care: Secondary | ICD-10-CM

## 2020-10-16 NOTE — Progress Notes (Signed)
[facility] PALLIATIVE CARE RN NOTE  PATIENT NAME: [name] DOB: 07/10/1965 MRN: [id number]  PRIMARY CARE PROVIDER: [facility]  RESPONSIBLE PARTY:  Acct ID - Guarantor Home Phone Work Phone Relationship Acct Type  [id number] JHERI, [phone]  Self P/F     [address] [place], [place] [address]   Covid-19 Pre-screening Negative  PLAN OF CARE and INTERVENTION:  ADVANCE CARE PLANNING/GOALS OF CARE: Goal is for patient to remain in her home and do what she can to keep her condition as stable as possible.  PATIENT/CAREGIVER EDUCATION: Symptom management, pain management, safe mobility DISEASE STATUS: Joint follow-up palliative care visit made with Katheren Puller, MSW. Met with patient and her fiance in their home. Upon arrival patient is sweeping her front porch. She reports pain and muscle spasms in her arms, legs, feet, back and abdomen. She says she lives in "constant pain." She currently takes Oxycodone which does make pain more tolerable. On a good day she averages Oxycodone 2 tablets daily and 4 tablets on a bad day. She was recently had an ED visit she says for c/o joint pain, low potassium and constipation. She was treated and released back to her home. She continues on a potassium supplement. She says that nutrients don't absorb well in her body. She drinks Nesquik, Glucerna and protein drinks for nutritional supplementation. For constipation she will drink additional Lactulose and sometimes add apple juice to help. She is on Lactulose due to her cirrhosis. She has a bowel movement every other day. She is a diabetic and has an insulin pump. She does have some shortness of breath on exertion at times. She has inhalers and nebulizer treatments she takes on an as needed basis and oxygen as needed usually 4L or less. She does need some assistance at times during baths. No recent falls. Denies pruritis today.   CODE STATUS: DNR ADVANCED DIRECTIVES: Y MOST FORM:  yes PPS: 50%   PHYSICAL EXAM:   VITALS: Today's Vitals   10/16/20 1108  BP: (!) 131/54  Pulse: 78  Resp: 18  Temp: 98.4 F (36.9 C)  TempSrc: Temporal  SpO2: 93%  PainSc: 3   PainLoc: Generalized    LUNGS: clear to auscultation  CARDIAC: Cor RRR EXTREMITIES: trace edema to bilateral feet SKIN:  Exposed skin is dry and intact   NEURO:  Alert and oriented x 4, pleasant mood, ambulatory   (Duration of visit and documentation 45 minutes)   Daryl Eastern, RN BSN

## 2020-10-16 NOTE — Progress Notes (Signed)
[facility] SW NOTE  PATIENT NAME: [name] DOB: 05/11/1965 MRN: [id number]  PRIMARY CARE PROVIDER: [facility]  RESPONSIBLE PARTY:  Acct ID - Guarantor Home Phone Work Phone Relationship Acct Type  [id number] LAINE, [phone]  Self P/F     [place], [place] [address]     PLAN OF CARE and INTERVENTIONS:             GOALS OF CARE/ ADVANCE CARE PLANNING:  Goal is for patient to remain in her home. SOCIAL/EMOTIONAL/SPIRITUAL ASSESSMENT/ INTERVENTIONS:  SW and RN-M. Nadara Mustard completed a joint follow-up visit with patient at her home. Patient greeted the team outside and walked the team inside her home where the visit took place.  Her significant other-Sam arrived during the visit. Patient appears thin in appearance, but was cordial, talkative and engaged with the team. Patient report that she has constant pain and muscle spasms to her arms, legs, back, feet and stomach. She takes pain medication and tolerates the pain. She has a recent ED visit due to low potassium and constipation. She stayed overnight in the hospital. She continues on a potassium supplement. Because she does not absorb nutrients well, patient take Glucerna and Nesquik supplements for nutrition. Patient also takes Lactulose, which apple juice added cor her cirrhosis. Her bowel movements are not regular as she has a BM about every other day. She is a diabetic and has an insulin pump. She reports some SOB with exertion that is treated with nebulizer, inhalers and PRN 4L of o2. She independent for ADL's, but require stand-by assistance for showers. Patient denied any recent falls.Patient reports that she is also anemic and is always cold. Patient appears to be in good spirits despite ongoing medical issues. Patient has several animals that provide safety and comfort to her. She reports that she and Sam are currently engaged. Patient remains open to ongoing palliative/SW support.   PATIENT/CAREGIVER EDUCATION/ COPING:  Patient appears to be coping well. Her significant other is very supportive along with her brother.  PERSONAL EMERGENCY PLAN:  911 can be activated for emergencies.  COMMUNITY RESOURCES COORDINATION/ HEALTH CARE NAVIGATION:  None, patient is medically disabled.  FINANCIAL/LEGAL CONCERNS/INTERVENTIONS:  None.      SOCIAL HX:  Social History   Tobacco Use   Smoking status: Never   Smokeless tobacco: Never  Substance Use Topics   Alcohol use: No    CODE STATUS: To be assessed. ADVANCED DIRECTIVES: No MOST FORM COMPLETE: Yes HOSPICE EDUCATION PROVIDED: No  PPS: Patient is alert and oriented x3. Patient is independent for ADL's.   Duration of visit and documentation: 60 minutes.   [address] [place], [place]

## 2020-11-29 ENCOUNTER — Encounter (HOSPITAL_COMMUNITY): Payer: Self-pay

## 2020-11-29 ENCOUNTER — Emergency Department (HOSPITAL_COMMUNITY): Payer: Medicare HMO

## 2020-11-29 ENCOUNTER — Other Ambulatory Visit: Payer: Self-pay

## 2020-11-29 ENCOUNTER — Emergency Department (HOSPITAL_COMMUNITY)
Admission: EM | Admit: 2020-11-29 | Discharge: 2020-11-30 | Disposition: A | Discharge status: Home / Self Care | Payer: Medicare HMO | Attending: Emergency Medicine | Admitting: Emergency Medicine

## 2020-11-29 DIAGNOSIS — N390 Urinary tract infection, site not specified: Secondary | ICD-10-CM | POA: Diagnosis not present

## 2020-11-29 DIAGNOSIS — R4182 Altered mental status, unspecified: Secondary | ICD-10-CM | POA: Insufficient documentation

## 2020-11-29 DIAGNOSIS — Z7984 Long term (current) use of oral hypoglycemic drugs: Secondary | ICD-10-CM | POA: Diagnosis not present

## 2020-11-29 DIAGNOSIS — M79605 Pain in left leg: Secondary | ICD-10-CM | POA: Diagnosis not present

## 2020-11-29 DIAGNOSIS — Z8616 Personal history of COVID-19: Secondary | ICD-10-CM | POA: Diagnosis not present

## 2020-11-29 DIAGNOSIS — E1110 Type 2 diabetes mellitus with ketoacidosis without coma: Secondary | ICD-10-CM | POA: Insufficient documentation

## 2020-11-29 DIAGNOSIS — Z794 Long term (current) use of insulin: Secondary | ICD-10-CM | POA: Diagnosis not present

## 2020-11-29 DIAGNOSIS — I10 Essential (primary) hypertension: Secondary | ICD-10-CM | POA: Diagnosis not present

## 2020-11-29 DIAGNOSIS — Z79899 Other long term (current) drug therapy: Secondary | ICD-10-CM | POA: Insufficient documentation

## 2020-11-29 DIAGNOSIS — E1140 Type 2 diabetes mellitus with diabetic neuropathy, unspecified: Secondary | ICD-10-CM | POA: Diagnosis not present

## 2020-11-29 DIAGNOSIS — J449 Chronic obstructive pulmonary disease, unspecified: Secondary | ICD-10-CM | POA: Diagnosis not present

## 2020-11-29 NOTE — ED Triage Notes (Signed)
Pt arrived via POV from home for original c/o left leg from last night describing it as throbbing when she stood up. Pt uses a walker at home and sometimes a wheelchair to ambulate at home. Pt has symptoms of confusion and prominent jaundice in triage with cramping in her hands and admits taking lots of tylenol as a child when she was SI, but now is better, but just confused.

## 2020-11-29 NOTE — ED Provider Notes (Signed)
[facility] EMERGENCY DEPARTMENT Provider Note   CSN: [id number] Arrival date & time: 11/29/20  2119     History Chief Complaint  Patient presents with   Altered Mental Status    [name] is a 56 y.o. female.  Patient presents to the emergency department for evaluation of left leg pain.  Patient reports that the pain began yesterday.  She has noticed swelling and tenderness of the calf area.  She reports that it looks bruised.  She is concerned because she has a history of blood clots.  She has not currently on anticoagulation.  Patient also is concerned that her ammonia might be elevated.  She reports that she is having some trouble with concentration and confusion.      Past Medical History:  Diagnosis Date   Degenerative joint disease    Diabetes mellitus without complication ([place])    Gastroesophageal reflux    Pancreatitis    Panic attack    Sleep apnea    Thrombocytopenia [facility])     Patient Active Problem List   Diagnosis Date Noted   Hypoxia    Acute hypoxemic respiratory failure (HCC)    Pressure injury of skin 07/28/2020   Malnutrition of moderate degree 07/21/2020   Sepsis ([place]) 07/19/2020   Acute lower UTI 07/19/2020   DNR (do not resuscitate) 07/19/2020   Pancreatic insufficiency 07/19/2020   Chronic liver failure without hepatic coma ([place])    Physical deconditioning    Anemia    Coagulopathy ([place])    Ascites 03/30/2020   Abnormal weight gain 03/30/2020   Acute on chronic alteration in mental status 02/23/2020   Heart murmur, systolic 08/65/7846   Pneumonia involving right lung 02/23/2020   Distended abdomen 02/18/2020   Increased ammonia level 02/18/2020   Uncontrolled diabetes mellitus ([place]) 12/05/2019   Valvular heart disease 11/02/2019   Major depressive disorder, single episode, severe with psychotic features ([place]) 10/04/2019   Severe recurrent major depressive disorder with psychotic features ([place]) 10/04/2019   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   Encephalopathy, portal systemic ([place]) 08/18/2019   Abnormal head CT 04/02/2019   Multiple falls 04/02/2019   Stroke-like symptoms 04/02/2019   Panic attack 03/30/2019   History of COPD 03/20/2019   Chronic abdominal pain 02/05/2019   DKA (diabetic ketoacidoses) 01/26/2019   DKA (diabetic ketoacidosis) ([place]) 01/26/2019   Sleep apnea    Gastroesophageal reflux    Hepatitis A 01/13/2019   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[place]) 01/12/2019   COVID-19 01/12/2019   Glycosuria 01/12/2019   Serum total bilirubin elevated 01/12/2019   Hypoalbuminemia 01/12/2019   Normocytic anemia, not due to blood loss 01/12/2019   Uncontrolled type 2 diabetes mellitus with hyperglycemia, with long-term current use of insulin ([place]) 01/12/2019   Low TSH level 01/12/2019   Chest pain 12/16/2018   Left-sided weakness 12/16/2018   Weight loss 12/16/2018   Schizoaffective disorder ([place]) 03/10/2018   Essential hypertension 03/09/2018   H/O partial thyroidectomy 10/25/2017   Thyroid nodule 10/25/2017   Disease of thyroid gland 10/25/2017   Cricopharyngeal hypertrophy 09/26/2017   Pharyngoesophageal dysphagia 09/26/2017   Reaction to severe stress 09/11/2017   Left hemiparesis ([place]) 04/18/2017   Disorder of shoulder 03/03/2017   Lactic acidosis 09/27/2016   Pancytopenia ([place]) 09/27/2016   Acquired hypothyroidism 09/27/2016   Microalbuminuria due to type 2 diabetes mellitus ([place]) 04/20/2016   Diabetic peripheral neuropathy associated with type 2 diabetes mellitus ([place]) 12/03/2015   Hepatic steatosis 12/03/2015   Diabetes mellitus without complication (  [place]) 03/20/2015   Moderate episode of recurrent major depressive disorder ([place]) 03/20/2015   UTI (urinary tract infection) 12/25/2014   Vaginitis and vulvovaginitis 11/06/2014   Blurry vision, bilateral 10/17/2014   Health care maintenance 10/17/2014   Nausea 10/17/2014   Multinodular thyroid 07/01/2014   Unspecified cirrhosis of  liver ([place]) 03/05/2014   Postoperative hypothyroidism 03/05/2014   Type 2 diabetes mellitus ([place]) 03/05/2014   Thrombocytopenia ([place]) 28/41/3244   Alcoholic cirrhosis ([place]) 03/23/7251   Back problem 05/28/2013   Low back pain 04/12/2013   Borderline personality disorder ([place]) 08/25/2012   Depression 08/25/2012   Hypercholesterolemia 08/25/2012   Osteoarthrosis 08/25/2012   Gastro-esophageal reflux disease without esophagitis 08/25/2012   Obstructive sleep apnea 08/25/2012    Past Surgical History:  Procedure Laterality Date   CHOLECYSTECTOMY     SHOULDER SURGERY     left    tongue sx. titanium screw placed to hold tongue down     TOTAL THYROIDECTOMY       OB History   No obstetric history on file.     History reviewed. No pertinent family history.  Social History   Tobacco Use   Smoking status: Never   Smokeless tobacco: Never  Vaping Use   Vaping Use: Never used  Substance Use Topics   Alcohol use: No   Drug use: No    Home Medications Prior to Admission medications   Medication Sig Start Date End Date Taking? Authorizing Provider  albuterol (PROVENTIL) (2.5 MG/3ML) 0.083% nebulizer solution Inhale 2.5 mg into the lungs every 6 (six) hours as needed for wheezing or shortness of breath.  04/19/19   [provider]  albuterol (VENTOLIN HFA) 108 (90 Base) MCG/ACT inhaler Inhale 2 puffs into the lungs every 4 (four) hours as needed for wheezing or shortness of breath. 04/02/20   Roxan Hockey, MD  Continuous Blood Gluc Sensor (DEXCOM G6 SENSOR) MISC Inject into the skin See admin instructions. Place a new sensor under the skin every 10 days    [provider]  furosemide (LASIX) 20 MG tablet Take 20 mg by mouth in the morning and at bedtime.    [provider]  insulin aspart (NOVOLOG) 100 UNIT/ML injection Via Insulin Pump 07/31/20   Johnson, Clanford L, MD  Insulin Human (INSULIN PUMP) SOLN Inject into the skin continuous.    [provider]  lactulose (CHRONULAC) 10 GM/15ML solution Take 45 mLs (30 g total) by mouth 3 (three) times daily. Take 30 grams (45 ml's) by mouth three to five times a day until 3 bowel movements a day are achieved, then as directed 07/31/20   Murlean Iba, MD  levothyroxine (SYNTHROID, LEVOTHROID) 50 MCG tablet Take 50 mcg by mouth daily before breakfast.  07/25/14   [provider]  lipase/protease/amylase (CREON) 12000-38000 units CPEP capsule Take 4 capsules (48,000 Units total) by mouth 3 (three) times daily before meals. 07/31/20   Johnson, Clanford L, MD  Magnesium 400 MG TABS Take 400 mg by mouth daily.    [provider]  NITROGLYCERIN PO Place 1 tablet under the tongue every 5 (five) minutes as needed (as directed, for chest pain).    [provider]  ondansetron (ZOFRAN ODT) 4 MG disintegrating tablet Take 1 tablet (4 mg total) by mouth See admin instructions. Dissolve 4 mg orally one to two times a day 07/31/20   Irwin Brakeman L, MD  Oxycodone HCl 10 MG TABS Take 10 mg by mouth every 6 (six) hours  as needed (pain).    [provider]  OXYGEN Inhale 2-4 L/min into the lungs as needed (for shortness of breath).    [provider]  potassium chloride SA (KLOR-CON) 20 MEQ tablet Take 20 mEq by mouth 2 (two) times daily.    [provider]  Vitamin D, Ergocalciferol, (DRISDOL) 1.25 MG (50000 UNIT) CAPS capsule Take 50,000 Units by mouth every Monday. 06/29/20   [provider]  XIFAXAN 550 MG TABS tablet Take 550 mg by mouth 2 (two) times daily. 05/28/20   [provider]  lisinopril (ZESTRIL) 10 MG tablet Take 10 mg by mouth daily. Patient not taking: Reported on 10/02/2019 03/29/19 10/02/19  [provider]  metFORMIN (GLUCOPHAGE) 1000 MG tablet Take 1,000 mg by mouth 2 (two) times daily with a meal.  Patient not taking: Reported on 10/02/2019  10/02/19  [provider]  metoCLOPramide (REGLAN) 10 MG  tablet Take 10 mg by mouth 4 (four) times daily.  Patient not taking: Reported on 10/02/2019  10/02/19  [provider]  sertraline (ZOLOFT) 100 MG tablet Take 150 mg by mouth daily. Patient not taking: Reported on 10/02/2019 09/10/16 10/02/19  [provider]  spironolactone (ALDACTONE) 50 MG tablet Take 50 mg by mouth daily. Patient not taking: Reported on 10/02/2019 03/27/19 10/02/19  [provider]  traZODone (DESYREL) 100 MG tablet Take 1 tablet by mouth daily. Patient not taking: Reported on 10/02/2019 09/15/16 10/02/19  [provider]    Allergies    Acetaminophen, Cephalexin, Ciprofloxacin, Hydromorphone, Sulfamethoxazole-trimethoprim, and Abilify [aripiprazole]  Review of Systems   Review of Systems  Musculoskeletal:  Positive for myalgias.  Psychiatric/Behavioral:  Positive for confusion.   All other systems reviewed and are negative.  Physical Exam Updated Vital Signs BP (!) 128/57   Pulse 74   Temp 99.1 F (37.3 C)   Resp (!) 22   Ht 5' 3"  (1.6 m)   Wt 54 kg   SpO2 100%   BMI 21.08 kg/m   Physical Exam Vitals and nursing note reviewed.  Constitutional:      General: She is not in acute distress.    Appearance: Normal appearance. She is well-developed.  HENT:     Head: Normocephalic and atraumatic.     Right Ear: Hearing normal.     Left Ear: Hearing normal.     Nose: Nose normal.  Eyes:     General: Scleral icterus present.     Conjunctiva/sclera: Conjunctivae normal.     Pupils: Pupils are equal, round, and reactive to light.  Cardiovascular:     Rate and Rhythm: Regular rhythm.     Heart sounds: S1 normal and S2 normal. No murmur heard.   No friction rub. No gallop.  Pulmonary:     Effort: Pulmonary effort is normal. No respiratory distress.     Breath sounds: Normal breath sounds.  Chest:     Chest wall: No tenderness.  Abdominal:     General: Bowel sounds are normal.     Palpations: Abdomen is soft.     Tenderness:  There is no abdominal tenderness. There is no guarding or rebound. Negative signs include Murphy's sign and McBurney's sign.     Hernia: No hernia is present.  Musculoskeletal:        General: Normal range of motion.     Cervical back: Normal range of motion and neck supple.  Skin:    General: Skin is warm and dry.     Findings:  No rash.  Neurological:     Mental Status: She is alert and oriented to person, place, and time.     GCS: GCS eye subscore is 4. GCS verbal subscore is 5. GCS motor subscore is 6.     Cranial Nerves: No cranial nerve deficit.     Sensory: No sensory deficit.     Coordination: Coordination normal.  Psychiatric:        Speech: Speech normal.        Behavior: Behavior normal.        Thought Content: Thought content normal.    ED Results / Procedures / Treatments   Labs (all labs ordered are listed, but only abnormal results are displayed) Labs Reviewed  CBC WITH DIFFERENTIAL/PLATELET - Abnormal; Notable for the following components:      Result Value   RBC 2.87 (*)    Hemoglobin 10.4 (*)    HCT 31.7 (*)    MCV 110.5 (*)    MCH 36.2 (*)    Platelets 51 (*)    All other components within normal limits  COMPREHENSIVE METABOLIC PANEL - Abnormal; Notable for the following components:   Glucose, Bld 131 (*)    Calcium 8.0 (*)    Total Protein 5.9 (*)    Albumin 2.5 (*)    AST 68 (*)    Total Bilirubin 6.8 (*)    Anion gap 1 (*)    All other components within normal limits  AMMONIA - Abnormal; Notable for the following components:   Ammonia 54 (*)    All other components within normal limits  URINALYSIS, ROUTINE W REFLEX MICROSCOPIC - Abnormal; Notable for the following components:   APPearance HAZY (*)    Glucose, UA 100 (*)    Hgb urine dipstick LARGE (*)    Bilirubin Urine MODERATE (*)    Protein, ur 30 (*)    Nitrite POSITIVE (*)    Leukocytes,Ua SMALL (*)    All other components within normal limits  PROTIME-INR - Abnormal; Notable for the  following components:   Prothrombin Time 20.5 (*)    INR 1.8 (*)    All other components within normal limits  D-DIMER, QUANTITATIVE - Abnormal; Notable for the following components:   D-Dimer, Quant 2.14 (*)    All other components within normal limits  URINALYSIS, MICROSCOPIC (REFLEX) - Abnormal; Notable for the following components:   Bacteria, UA MANY (*)    All other components within normal limits    EKG EKG Interpretation  Date/Time:  Sunday November 29 2020 23:35:17 EDT Ventricular Rate:  76 PR Interval:  129 QRS Duration: 102 QT Interval:  411 QTC Calculation: 463 R Axis:   63 Text Interpretation: Sinus rhythm Probable left ventricular hypertrophy Confirmed by Orpah Greek [id number]) on 11/30/2020 1:27:35 AM  Radiology CT HEAD WO CONTRAST (5MM)  Result Date: 11/30/2020 CLINICAL DATA:  Altered mental status. EXAM: CT HEAD WITHOUT CONTRAST TECHNIQUE: Contiguous axial images were obtained from the base of the skull through the vertex without intravenous contrast. COMPARISON:  Head CT dated 07/19/2020. FINDINGS: Brain: The ventricles and sulci appropriate size for patient's age. The gray-white matter discrimination is preserved. There is no acute intracranial hemorrhage. No mass effect or midline shift. No extra-axial fluid collection. Vascular: No hyperdense vessel or unexpected calcification. Skull: Normal. Negative for fracture or focal lesion. Sinuses/Orbits: No acute finding. Other: None IMPRESSION: Unremarkable noncontrast CT of the brain. Electronically Signed   By: Anner Crete M.D.   On: 11/30/2020  00:13    Procedures Procedures   Medications Ordered in ED Medications  fosfomycin (MONUROL) packet 3 g (3 g Oral Given 11/30/20 0316)    ED Course  I have reviewed the triage vital signs and the nursing notes.  Pertinent labs & imaging results that were available during my care of the patient were reviewed by me and considered in my medical decision making  (see chart for details).    MDM Rules/Calculators/A&P                           Patient presents to the emergency department with complaints of pain and swelling of the left leg.  Patient does report that she has a history of DVT.  She is not currently anticoagulated because of thrombocytopenia and oral anticoagulation secondary to liver disease.  Patient denies any injury.  There is tenderness and mild swelling of the left calf on exam.  D-dimer is elevated.  Will need venous duplex to rule out DVT.  Patient complaining of mild confusion.  She thinks it might be her ammonia.  Ammonia level was not significantly elevated at this time.  She does, however, have evidence of urinary tract infection.  Culture sent.  This was treated with fosfomycin.  Patient will be signed out to oncoming ER physician to follow-up venous duplex and disposition patient.  Final Clinical Impression(s) / ED Diagnoses Final diagnoses:  Urinary tract infection without hematuria, site unspecified    Rx / DC Orders ED Discharge Orders     None        Orpah Greek, MD 11/30/20 [phone]

## 2020-11-29 NOTE — ED Notes (Signed)
Pt placed on cardiac monitor with BP to set cycle every 30 minutes. Continuous pulse oximeter applied.

## 2020-11-30 ENCOUNTER — Encounter (HOSPITAL_COMMUNITY): Payer: Self-pay

## 2020-11-30 ENCOUNTER — Emergency Department (HOSPITAL_COMMUNITY): Payer: Medicare HMO

## 2020-11-30 DIAGNOSIS — N390 Urinary tract infection, site not specified: Secondary | ICD-10-CM | POA: Diagnosis not present

## 2020-11-30 LAB — D-DIMER, QUANTITATIVE: D-Dimer, Quant: 2.14 ug/mL-FEU — ABNORMAL HIGH (ref 0.00–0.50)

## 2020-11-30 LAB — URINALYSIS, ROUTINE W REFLEX MICROSCOPIC
Glucose, UA: 100 mg/dL — AB
Ketones, ur: NEGATIVE mg/dL
Nitrite: POSITIVE — AB
Protein, ur: 30 mg/dL — AB
Specific Gravity, Urine: 1.025 (ref 1.005–1.030)
pH: 6 (ref 5.0–8.0)

## 2020-11-30 LAB — CBC WITH DIFFERENTIAL/PLATELET
Abs Immature Granulocytes: 0.03 10*3/uL (ref 0.00–0.07)
Basophils Absolute: 0 10*3/uL (ref 0.0–0.1)
Basophils Relative: 1 %
Eosinophils Absolute: 0.1 10*3/uL (ref 0.0–0.5)
Eosinophils Relative: 1 %
HCT: 31.7 % — ABNORMAL LOW (ref 36.0–46.0)
Hemoglobin: 10.4 g/dL — ABNORMAL LOW (ref 12.0–15.0)
Immature Granulocytes: 1 %
Lymphocytes Relative: 14 %
Lymphs Abs: 0.8 10*3/uL (ref 0.7–4.0)
MCH: 36.2 pg — ABNORMAL HIGH (ref 26.0–34.0)
MCHC: 32.8 g/dL (ref 30.0–36.0)
MCV: 110.5 fL — ABNORMAL HIGH (ref 80.0–100.0)
Monocytes Absolute: 0.6 10*3/uL (ref 0.1–1.0)
Monocytes Relative: 10 %
Neutro Abs: 4.3 10*3/uL (ref 1.7–7.7)
Neutrophils Relative %: 73 %
Platelets: 51 10*3/uL — ABNORMAL LOW (ref 150–400)
RBC: 2.87 MIL/uL — ABNORMAL LOW (ref 3.87–5.11)
RDW: 15.1 % (ref 11.5–15.5)
WBC: 5.8 10*3/uL (ref 4.0–10.5)
nRBC: 0 % (ref 0.0–0.2)

## 2020-11-30 LAB — URINALYSIS, MICROSCOPIC (REFLEX)

## 2020-11-30 LAB — AMMONIA: Ammonia: 54 umol/L — ABNORMAL HIGH (ref 9–35)

## 2020-11-30 LAB — COMPREHENSIVE METABOLIC PANEL
ALT: 25 U/L (ref 0–44)
AST: 68 U/L — ABNORMAL HIGH (ref 15–41)
Albumin: 2.5 g/dL — ABNORMAL LOW (ref 3.5–5.0)
Alkaline Phosphatase: 120 U/L (ref 38–126)
Anion gap: 1 — ABNORMAL LOW (ref 5–15)
BUN: 16 mg/dL (ref 6–20)
CO2: 27 mmol/L (ref 22–32)
Calcium: 8 mg/dL — ABNORMAL LOW (ref 8.9–10.3)
Chloride: 107 mmol/L (ref 98–111)
Creatinine, Ser: 0.57 mg/dL (ref 0.44–1.00)
GFR, Estimated: >60 mL/min (ref >60)
Glucose, Bld: 131 mg/dL — ABNORMAL HIGH (ref 70–99)
Potassium: 3.7 mmol/L (ref 3.5–5.1)
Sodium: 135 mmol/L (ref 135–145)
Total Bilirubin: 6.8 mg/dL — ABNORMAL HIGH (ref 0.3–1.2)
Total Protein: 5.9 g/dL — ABNORMAL LOW (ref 6.5–8.1)

## 2020-11-30 LAB — PROTIME-INR
INR: 1.8 — ABNORMAL HIGH (ref 0.8–1.2)
Prothrombin Time: 20.5 seconds — ABNORMAL HIGH (ref 11.4–15.2)

## 2020-11-30 MED ORDER — FOSFOMYCIN TROMETHAMINE 3 G PO PACK
3.0000 g | PACK | Freq: Once | ORAL | Status: AC
  Administered 2020-11-30: 3 g via ORAL
  Filled 2020-11-30: qty 3

## 2020-11-30 NOTE — Discharge Instructions (Addendum)
Your ultrasound did not show any blood clots.  Follow-up with your family doctor later this week for recheck

## 2020-12-13 ENCOUNTER — Emergency Department (HOSPITAL_COMMUNITY)
Admission: EM | Admit: 2020-12-13 | Discharge: 2020-12-13 | Disposition: A | Discharge status: Home / Self Care | Payer: Medicare HMO | Attending: Emergency Medicine | Admitting: Emergency Medicine

## 2020-12-13 ENCOUNTER — Emergency Department (HOSPITAL_COMMUNITY): Payer: Medicare HMO

## 2020-12-13 ENCOUNTER — Other Ambulatory Visit: Payer: Self-pay

## 2020-12-13 ENCOUNTER — Encounter (HOSPITAL_COMMUNITY): Payer: Self-pay | Admitting: *Deleted

## 2020-12-13 DIAGNOSIS — S39012A Strain of muscle, fascia and tendon of lower back, initial encounter: Secondary | ICD-10-CM | POA: Diagnosis not present

## 2020-12-13 DIAGNOSIS — S86911A Strain of unspecified muscle(s) and tendon(s) at lower leg level, right leg, initial encounter: Secondary | ICD-10-CM | POA: Insufficient documentation

## 2020-12-13 DIAGNOSIS — Z8616 Personal history of COVID-19: Secondary | ICD-10-CM | POA: Insufficient documentation

## 2020-12-13 DIAGNOSIS — T148XXA Other injury of unspecified body region, initial encounter: Secondary | ICD-10-CM

## 2020-12-13 DIAGNOSIS — J449 Chronic obstructive pulmonary disease, unspecified: Secondary | ICD-10-CM | POA: Diagnosis not present

## 2020-12-13 DIAGNOSIS — S86912A Strain of unspecified muscle(s) and tendon(s) at lower leg level, left leg, initial encounter: Secondary | ICD-10-CM | POA: Insufficient documentation

## 2020-12-13 DIAGNOSIS — W08XXXA Fall from other furniture, initial encounter: Secondary | ICD-10-CM | POA: Insufficient documentation

## 2020-12-13 DIAGNOSIS — R519 Headache, unspecified: Secondary | ICD-10-CM | POA: Diagnosis not present

## 2020-12-13 DIAGNOSIS — Z79899 Other long term (current) drug therapy: Secondary | ICD-10-CM | POA: Insufficient documentation

## 2020-12-13 DIAGNOSIS — I10 Essential (primary) hypertension: Secondary | ICD-10-CM | POA: Diagnosis not present

## 2020-12-13 DIAGNOSIS — E039 Hypothyroidism, unspecified: Secondary | ICD-10-CM | POA: Diagnosis not present

## 2020-12-13 DIAGNOSIS — Z794 Long term (current) use of insulin: Secondary | ICD-10-CM | POA: Insufficient documentation

## 2020-12-13 DIAGNOSIS — E1110 Type 2 diabetes mellitus with ketoacidosis without coma: Secondary | ICD-10-CM | POA: Diagnosis not present

## 2020-12-13 DIAGNOSIS — S169XXA Unspecified injury of muscle, fascia and tendon at neck level, initial encounter: Secondary | ICD-10-CM | POA: Diagnosis present

## 2020-12-13 DIAGNOSIS — K7460 Unspecified cirrhosis of liver: Secondary | ICD-10-CM | POA: Insufficient documentation

## 2020-12-13 DIAGNOSIS — S161XXA Strain of muscle, fascia and tendon at neck level, initial encounter: Secondary | ICD-10-CM | POA: Diagnosis not present

## 2020-12-13 DIAGNOSIS — W19XXXA Unspecified fall, initial encounter: Secondary | ICD-10-CM

## 2020-12-13 DIAGNOSIS — K769 Liver disease, unspecified: Secondary | ICD-10-CM

## 2020-12-13 LAB — CBC WITH DIFFERENTIAL/PLATELET
Abs Immature Granulocytes: 0.05 10*3/uL (ref 0.00–0.07)
Basophils Absolute: 0 10*3/uL (ref 0.0–0.1)
Basophils Relative: 0 %
Eosinophils Absolute: 0.1 10*3/uL (ref 0.0–0.5)
Eosinophils Relative: 1 %
HCT: 32.8 % — ABNORMAL LOW (ref 36.0–46.0)
Hemoglobin: 10.7 g/dL — ABNORMAL LOW (ref 12.0–15.0)
Immature Granulocytes: 1 %
Lymphocytes Relative: 4 %
Lymphs Abs: 0.4 10*3/uL — ABNORMAL LOW (ref 0.7–4.0)
MCH: 36.3 pg — ABNORMAL HIGH (ref 26.0–34.0)
MCHC: 32.6 g/dL (ref 30.0–36.0)
MCV: 111.2 fL — ABNORMAL HIGH (ref 80.0–100.0)
Monocytes Absolute: 0.5 10*3/uL (ref 0.1–1.0)
Monocytes Relative: 6 %
Neutro Abs: 7.6 10*3/uL (ref 1.7–7.7)
Neutrophils Relative %: 88 %
Platelets: 46 10*3/uL — ABNORMAL LOW (ref 150–400)
RBC: 2.95 MIL/uL — ABNORMAL LOW (ref 3.87–5.11)
RDW: 15.1 % (ref 11.5–15.5)
WBC: 8.5 10*3/uL (ref 4.0–10.5)
nRBC: 0 % (ref 0.0–0.2)

## 2020-12-13 LAB — AMMONIA: Ammonia: 47 umol/L — ABNORMAL HIGH (ref 9–35)

## 2020-12-13 LAB — COMPREHENSIVE METABOLIC PANEL
ALT: 25 U/L (ref 0–44)
AST: 72 U/L — ABNORMAL HIGH (ref 15–41)
Albumin: 2.4 g/dL — ABNORMAL LOW (ref 3.5–5.0)
Alkaline Phosphatase: 116 U/L (ref 38–126)
Anion gap: 7 (ref 5–15)
BUN: 13 mg/dL (ref 6–20)
CO2: 25 mmol/L (ref 22–32)
Calcium: 8.4 mg/dL — ABNORMAL LOW (ref 8.9–10.3)
Chloride: 105 mmol/L (ref 98–111)
Creatinine, Ser: 0.54 mg/dL (ref 0.44–1.00)
GFR, Estimated: >60 mL/min (ref >60)
Glucose, Bld: 134 mg/dL — ABNORMAL HIGH (ref 70–99)
Potassium: 4.2 mmol/L (ref 3.5–5.1)
Sodium: 137 mmol/L (ref 135–145)
Total Bilirubin: 8 mg/dL — ABNORMAL HIGH (ref 0.3–1.2)
Total Protein: 6.1 g/dL — ABNORMAL LOW (ref 6.5–8.1)

## 2020-12-13 LAB — LIPASE, BLOOD: Lipase: 31 U/L (ref 11–51)

## 2020-12-13 LAB — PROTIME-INR
INR: 1.9 — ABNORMAL HIGH (ref 0.8–1.2)
Prothrombin Time: 21.7 seconds — ABNORMAL HIGH (ref 11.4–15.2)

## 2020-12-13 MED ORDER — OXYCODONE HCL 5 MG PO TABS
5.0000 mg | ORAL_TABLET | Freq: Once | ORAL | Status: AC
  Administered 2020-12-13: 5 mg via ORAL
  Filled 2020-12-13: qty 1

## 2020-12-13 MED ORDER — CYCLOBENZAPRINE HCL 5 MG PO TABS: 5.0000 mg | ORAL_TABLET | Freq: Three times a day (TID) | ORAL | 0 refills | Status: DC | PRN

## 2020-12-13 NOTE — ED Provider Notes (Signed)
[facility] EMERGENCY DEPARTMENT Provider Note   CSN: [id number] Arrival date & time: 12/13/20  1416     History Chief Complaint  Patient presents with   Lytle Michaels    [name] is a 55 y.o. female with a history significant for type 2 diabetes, alcohol associated cirrhosis, hypertension and sleep apnea presenting with worsening multiple areas of pain secondary to a fall which occurred 3 days ago.  She was sleeping on the couch when she rolled off and landed on her bilateral knees which are very painful, additionally describes pain in her low back, neck and endorses a headache since this occurred although is unsure if she actually hit her head during this fall.  She presents today secondary to worsening pain to the point where she is not able to bear weight, but fianc at the bedside also states she has been having increased confusion.  Before arrival she got "lost" in her kitchen trying to find the bathroom according to fiance at bedside.  She does take lactulose daily.  She denies chest pain, shortness of breath, nausea or vomiting, she does have some pain in her right upper abdomen which she states is chronic.  She denies focal weakness or dizziness.  It is noted she has a low-grade fever here of 100.2.  She was not aware of being febrile at home.          The history is provided by the patient and a significant other (fiance at bedside).      Past Medical History:  Diagnosis Date   Degenerative joint disease    Diabetes mellitus without complication ([place])    Gastroesophageal reflux    Pancreatitis    Panic attack    Sleep apnea    Thrombocytopenia [facility])     Patient Active Problem List   Diagnosis Date Noted   Hypoxia    Acute hypoxemic respiratory failure ([place])    Pressure injury of skin 07/28/2020   Malnutrition of moderate degree 07/21/2020   Sepsis ([place]) 07/19/2020   Acute lower UTI 07/19/2020   DNR (do not resuscitate) 07/19/2020   Pancreatic insufficiency  07/19/2020   Chronic liver failure without hepatic coma ([place])    Physical deconditioning    Anemia    Coagulopathy ([place])    Ascites 03/30/2020   Abnormal weight gain 03/30/2020   Acute on chronic alteration in mental status 02/23/2020   Heart murmur, systolic 35/46/5681   Pneumonia involving right lung 02/23/2020   Distended abdomen 02/18/2020   Increased ammonia level 02/18/2020   Uncontrolled diabetes mellitus ([place]) 12/05/2019   Valvular heart disease 11/02/2019   Major depressive disorder, single episode, severe with psychotic features ([place]) 10/04/2019   Severe recurrent major depressive disorder with psychotic features ([place]) 10/04/2019   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   Encephalopathy, portal systemic ([place]) 08/18/2019   Abnormal head CT 04/02/2019   Multiple falls 04/02/2019   Stroke-like symptoms 04/02/2019   Panic attack 03/30/2019   History of COPD 03/20/2019   Chronic abdominal pain 02/05/2019   DKA (diabetic ketoacidoses) 01/26/2019   DKA (diabetic ketoacidosis) ([place]) 01/26/2019   Sleep apnea    Gastroesophageal reflux    Hepatitis A 01/13/2019   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[place]) 01/12/2019   COVID-19 01/12/2019   Glycosuria 01/12/2019   Serum total bilirubin elevated 01/12/2019   Hypoalbuminemia 01/12/2019   Normocytic anemia, not due to blood loss 01/12/2019   Uncontrolled type 2 diabetes mellitus with hyperglycemia, with long-term current use of insulin ([place])  01/12/2019   Low TSH level 01/12/2019   Chest pain 12/16/2018   Left-sided weakness 12/16/2018   Weight loss 12/16/2018   Schizoaffective disorder ([place]) 03/10/2018   Essential hypertension 03/09/2018   H/O partial thyroidectomy 10/25/2017   Thyroid nodule 10/25/2017   Disease of thyroid gland 10/25/2017   Cricopharyngeal hypertrophy 09/26/2017   Pharyngoesophageal dysphagia 09/26/2017   Reaction to severe stress 09/11/2017   Left hemiparesis ([place]) 04/18/2017   Disorder of shoulder  03/03/2017   Lactic acidosis 09/27/2016   Pancytopenia ([place]) 09/27/2016   Acquired hypothyroidism 09/27/2016   Microalbuminuria due to type 2 diabetes mellitus ([place]) 04/20/2016   Diabetic peripheral neuropathy associated with type 2 diabetes mellitus ([place]) 12/03/2015   Hepatic steatosis 12/03/2015   Diabetes mellitus without complication ([place]) 52/84/1324   Moderate episode of recurrent major depressive disorder ([place]) 03/20/2015   UTI (urinary tract infection) 12/25/2014   Vaginitis and vulvovaginitis 11/06/2014   Blurry vision, bilateral 10/17/2014   Health care maintenance 10/17/2014   Nausea 10/17/2014   Multinodular thyroid 07/01/2014   Unspecified cirrhosis of liver ([place]) 03/05/2014   Postoperative hypothyroidism 03/05/2014   Type 2 diabetes mellitus ([place]) 03/05/2014   Thrombocytopenia ([place]) 40/12/2723   Alcoholic cirrhosis ([place]) 36/64/4034   Back problem 05/28/2013   Low back pain 04/12/2013   Borderline personality disorder ([place]) 08/25/2012   Depression 08/25/2012   Hypercholesterolemia 08/25/2012   Osteoarthrosis 08/25/2012   Gastro-esophageal reflux disease without esophagitis 08/25/2012   Obstructive sleep apnea 08/25/2012    Past Surgical History:  Procedure Laterality Date   CHOLECYSTECTOMY     SHOULDER SURGERY     left    tongue sx. titanium screw placed to hold tongue down     TOTAL THYROIDECTOMY       OB History   No obstetric history on file.     No family history on file.  Social History   Tobacco Use   Smoking status: Never   Smokeless tobacco: Never  Vaping Use   Vaping Use: Never used  Substance Use Topics   Alcohol use: No   Drug use: No    Home Medications Prior to Admission medications   Medication Sig Start Date End Date Taking? Authorizing Provider  albuterol (PROVENTIL) (2.5 MG/3ML) 0.083% nebulizer solution Inhale 2.5 mg into the lungs every 6 (six) hours as needed for wheezing or shortness of breath.  04/19/19  Yes [provider]  albuterol (VENTOLIN HFA) 108 (90 Base) MCG/ACT inhaler Inhale 2 puffs into the lungs every 4 (four) hours as needed for wheezing or shortness of breath. 04/02/20  Yes Emokpae, Courage, MD  cyclobenzaprine (FLEXERIL) 5 MG tablet Take 1 tablet (5 mg total) by mouth 3 (three) times daily as needed for muscle spasms. 12/13/20  Yes , Almyra Free, PA-C  furosemide (LASIX) 20 MG tablet Take 20 mg by mouth in the morning and at bedtime.   Yes [provider]  hydrOXYzine (ATARAX/VISTARIL) 25 MG tablet Take 25 mg by mouth 3 (three) times daily. 09/03/20  Yes [provider]  insulin aspart (NOVOLOG) 100 UNIT/ML injection Via Insulin Pump 07/31/20  Yes Johnson, Clanford L, MD  lactulose (CHRONULAC) 10 GM/15ML solution Take 45 mLs (30 g total) by mouth 3 (three) times daily. Take 30 grams (45 ml's) by mouth three to five times a day until 3 bowel movements a day are achieved, then as directed 07/31/20  Yes Johnson, Clanford L, MD  levothyroxine (SYNTHROID, LEVOTHROID) 50 MCG tablet Take 50 mcg by mouth daily before  breakfast.  07/25/14  Yes [provider]  lipase/protease/amylase (CREON) 12000-38000 units CPEP capsule Take 4 capsules (48,000 Units total) by mouth 3 (three) times daily before meals. 07/31/20  Yes Johnson, Clanford L, MD  Magnesium 400 MG TABS Take 400 mg by mouth daily.   Yes [provider]  ondansetron (ZOFRAN ODT) 4 MG disintegrating tablet Take 1 tablet (4 mg total) by mouth See admin instructions. Dissolve 4 mg orally one to two times a day 07/31/20  Yes Johnson, Clanford L, MD  Oxycodone HCl 10 MG TABS Take 10 mg by mouth every 6 (six) hours as needed (pain).   Yes [provider]  OXYGEN Inhale 2-4 L/min into the lungs as needed (for shortness of breath).   Yes [provider]  potassium chloride SA (KLOR-CON) 20 MEQ tablet Take 20 mEq by mouth 2 (two) times daily.   Yes [provider]  pregabalin (LYRICA) 75 MG capsule  Take 75 mg by mouth 3 (three) times daily. 12/04/20  Yes [provider]  Vitamin D, Ergocalciferol, (DRISDOL) 1.25 MG (50000 UNIT) CAPS capsule Take 50,000 Units by mouth every Wednesday. 06/29/20  Yes [provider]  XIFAXAN 550 MG TABS tablet Take 550 mg by mouth 2 (two) times daily. 05/28/20  Yes [provider]  ciprofloxacin (CIPRO) 500 MG tablet Take 500 mg by mouth 2 (two) times daily. Patient not taking: No sig reported 12/04/20   [provider]  Continuous Blood Gluc Sensor (DEXCOM G6 SENSOR) MISC Inject into the skin See admin instructions. Place a new sensor under the skin every 10 days    [provider]  Insulin Human (INSULIN PUMP) SOLN Inject into the skin continuous. Patient not taking: Reported on 12/13/2020    [provider]  NITROGLYCERIN PO Place 1 tablet under the tongue every 5 (five) minutes as needed (as directed, for chest pain). Patient not taking: Reported on 12/13/2020    [provider]  lisinopril (ZESTRIL) 10 MG tablet Take 10 mg by mouth daily. Patient not taking: Reported on 10/02/2019 03/29/19 10/02/19  [provider]  metFORMIN (GLUCOPHAGE) 1000 MG tablet Take 1,000 mg by mouth 2 (two) times daily with a meal.  Patient not taking: Reported on 10/02/2019  10/02/19  [provider]  metoCLOPramide (REGLAN) 10 MG tablet Take 10 mg by mouth 4 (four) times daily.  Patient not taking: Reported on 10/02/2019  10/02/19  [provider]  sertraline (ZOLOFT) 100 MG tablet Take 150 mg by mouth daily. Patient not taking: Reported on 10/02/2019 09/10/16 10/02/19  [provider]  spironolactone (ALDACTONE) 50 MG tablet Take 50 mg by mouth daily. Patient not taking: Reported on 10/02/2019 03/27/19 10/02/19  [provider]  traZODone (DESYREL) 100 MG tablet Take 1 tablet by mouth daily. Patient not taking: Reported on 10/02/2019 09/15/16 10/02/19  [provider]     Allergies    Acetaminophen, Cephalexin, Ciprofloxacin, Hydromorphone, Sulfamethoxazole-trimethoprim, and Abilify [aripiprazole]  Review of Systems   Review of Systems  Constitutional:  Positive for fever. Negative for chills.  HENT:  Negative for congestion.   Eyes: Negative.   Respiratory:  Negative for chest tightness, shortness of breath and wheezing.   Cardiovascular:  Negative for chest pain.  Gastrointestinal:  Positive for abdominal pain. Negative for nausea and vomiting.  Genitourinary: Negative.   Musculoskeletal:  Positive for arthralgias, back pain and neck pain. Negative for joint swelling and neck stiffness.  Skin: Negative.  Negative for rash and wound.  Neurological:  Negative for dizziness, weakness, light-headedness and numbness.  Psychiatric/Behavioral:  Positive for confusion.    Physical Exam Updated Vital Signs BP (!) 120/54 (BP Location: Right Arm)   Pulse 91   Temp 99.1 F (37.3 C) (Oral)   Resp 17   Ht 5' 3" (1.6 m)   Wt 54 kg   SpO2 90%   BMI 21.08 kg/m   Physical Exam Vitals and nursing note reviewed.  Constitutional:      Appearance: She is well-developed.  HENT:     Head: Normocephalic and atraumatic.     Mouth/Throat:     Mouth: Mucous membranes are moist.     Pharynx: Oropharynx is clear.  Eyes:     General: Scleral icterus present.     Conjunctiva/sclera: Conjunctivae normal.  Cardiovascular:     Rate and Rhythm: Normal rate and regular rhythm.     Heart sounds: Normal heart sounds.  Pulmonary:     Effort: Pulmonary effort is normal.     Breath sounds: Normal breath sounds. No wheezing.  Abdominal:     General: Bowel sounds are normal.     Palpations: Abdomen is soft.     Tenderness: There is abdominal tenderness in the right upper quadrant. There is no guarding or rebound.  Musculoskeletal:        General: Normal range of motion.     Cervical back: Normal range of motion. Bony tenderness present. No swelling or deformity.      Lumbar back: Bony tenderness present. No swelling or deformity.     Right knee: Bony tenderness present. No effusion or erythema. Normal meniscus and normal patellar mobility.     Left knee: Bony tenderness present. No effusion or erythema. Normal meniscus and normal patellar mobility.     Comments: Tender to palpation of both patellas.  There is no appreciable joint effusion.  No bruising noted.  Patellar tendons are intact.  She does have pain with active and passive range of motion of her knee joints.  No ligament instability appreciated.  Skin:    General: Skin is warm and dry.     Coloration: Skin is jaundiced.  Neurological:     Mental Status: She is alert.    ED Results / Procedures / Treatments   Labs (all labs ordered are listed, but only abnormal results are displayed) Labs Reviewed  CBC WITH DIFFERENTIAL/PLATELET - Abnormal; Notable for the following components:      Result Value   RBC 2.95 (*)    Hemoglobin 10.7 (*)    HCT 32.8 (*)    MCV 111.2 (*)    MCH 36.3 (*)    Platelets 46 (*)    Lymphs Abs 0.4 (*)    All other components within normal limits  COMPREHENSIVE METABOLIC PANEL - Abnormal; Notable for the following components:   Glucose, Bld 134 (*)    Calcium 8.4 (*)    Total Protein 6.1 (*)    Albumin 2.4 (*)    AST 72 (*)    Total Bilirubin 8.0 (*)    All other components within normal limits  PROTIME-INR - Abnormal; Notable for the following components:   Prothrombin Time 21.7 (*)    INR 1.9 (*)    All other components within normal limits  AMMONIA - Abnormal; Notable for the following components:   Ammonia 47 (*)    All other components within normal limits  LIPASE, BLOOD    EKG None  Radiology DG Lumbar Spine Complete  Result Date: 12/13/2020 CLINICAL DATA:  Fall with back pain EXAM: LUMBAR SPINE - COMPLETE 4+ VIEW COMPARISON:  CT 07/19/2020 FINDINGS: Lumbar alignment is normal. Vertebral body heights are maintained. Mild disc space narrowing at  L4-L5. IMPRESSION: No acute osseous abnormality. Electronically Signed   By: Donavan Foil M.D.   On: 12/13/2020 17:22   CT HEAD WO CONTRAST (5MM)  Result Date: 12/13/2020 CLINICAL DATA:  Fall.  Neck pain. EXAM: CT HEAD WITHOUT CONTRAST CT CERVICAL SPINE WITHOUT CONTRAST TECHNIQUE: Multidetector CT imaging of the head and cervical spine was performed following the standard protocol without intravenous contrast. Multiplanar CT image reconstructions of the cervical spine were also generated. COMPARISON:  04/06/2017. FINDINGS: CT HEAD FINDINGS Brain: No evidence of acute infarction, hemorrhage, hydrocephalus, extra-axial collection or mass lesion/mass effect. Vascular: No hyperdense vessel or unexpected calcification. Skull: Normal. Negative for fracture or focal lesion. Sinuses/Orbits: Globes and orbits are unremarkable. Visualized sinuses are clear. Other: None. CT CERVICAL SPINE FINDINGS Alignment: Normal. Skull base and vertebrae: No acute fracture. No primary bone lesion or focal pathologic process. Soft tissues and spinal canal: No prevertebral fluid or swelling. No visible canal hematoma. Disc levels: Moderate loss of disc height at C6-C7 with mild disc bulging. Minor disc bulging at C4-C5. No disc herniations. No significant stenosis. Upper chest: Heterogeneous enlargement of the right thyroid lobe. No acute findings. Clear lung apices. Other: None. IMPRESSION: HEAD CT 1. Normal. CERVICAL CT 1. No fracture or acute finding. 2. Heterogeneous enlargement of the right thyroid lobe. Recommend thyroid ultrasound (ref: J Am Coll Radiol. 2015 Feb;12(2): 143-50). Electronically Signed   By: Lajean Manes M.D.   On: 12/13/2020 16:58   CT Cervical Spine Wo Contrast  Result Date: 12/13/2020 CLINICAL DATA:  Fall.  Neck pain. EXAM: CT HEAD WITHOUT CONTRAST CT CERVICAL SPINE WITHOUT CONTRAST TECHNIQUE: Multidetector CT imaging of the head and cervical spine was performed following the standard protocol without  intravenous contrast. Multiplanar CT image reconstructions of the cervical spine were also generated. COMPARISON:  04/06/2017. FINDINGS: CT HEAD FINDINGS Brain: No evidence of acute infarction, hemorrhage, hydrocephalus, extra-axial collection or mass lesion/mass effect. Vascular: No hyperdense vessel or unexpected calcification. Skull: Normal. Negative for fracture or focal lesion. Sinuses/Orbits: Globes and orbits are unremarkable. Visualized sinuses are clear. Other: None. CT CERVICAL SPINE FINDINGS Alignment: Normal. Skull base and vertebrae: No acute fracture. No primary bone lesion or focal pathologic process. Soft tissues and spinal canal: No prevertebral fluid or swelling. No visible canal hematoma. Disc levels: Moderate loss of disc height at C6-C7 with mild disc bulging. Minor disc bulging at C4-C5. No disc herniations. No significant stenosis. Upper chest: Heterogeneous enlargement of the right thyroid lobe. No acute findings. Clear lung apices. Other: None. IMPRESSION: HEAD CT 1. Normal. CERVICAL CT 1. No fracture or acute finding. 2. Heterogeneous enlargement of the right thyroid lobe. Recommend thyroid ultrasound (ref: J Am Coll Radiol. 2015 Feb;12(2): 143-50). Electronically Signed   By: Lajean Manes M.D.   On: 12/13/2020 16:58   DG Knee Complete 4 Views Left  Result Date: 12/13/2020 CLINICAL DATA:  Fall with knee pain EXAM: LEFT KNEE - COMPLETE 4+ VIEW COMPARISON:  None. FINDINGS: No evidence of fracture, dislocation, or joint effusion. No evidence of arthropathy or other focal bone abnormality. Soft tissues are unremarkable. IMPRESSION: Negative. Electronically Signed   By: Donavan Foil M.D.   On: 12/13/2020 17:23   DG Knee Complete 4 Views Right  Result Date: 12/13/2020 CLINICAL DATA:  Fall with knee pain  EXAM: RIGHT KNEE - COMPLETE 4+ VIEW COMPARISON:  None. FINDINGS: No evidence of fracture, dislocation, or joint effusion. No evidence of arthropathy or other focal bone abnormality. Soft  tissues are unremarkable. IMPRESSION: Negative. Electronically Signed   By: Donavan Foil M.D.   On: 12/13/2020 17:22    Procedures Procedures   Medications Ordered in ED Medications  oxyCODONE (Oxy IR/ROXICODONE) immediate release tablet 5 mg (5 mg Oral Given 12/13/20 1741)    ED Course  I have reviewed the triage vital signs and the nursing notes.  Pertinent labs & imaging results that were available during my care of the patient were reviewed by me and considered in my medical decision making (see chart for details).    MDM Rules/Calculators/A&P                           Labs reviewed and appear stable in comparison to prior labs.  Patient states that her platelet count is generally around 40 at baseline.  She is anemic, also stable.  C-Met is also reviewed and compared to priors, her AST is slightly elevated and chronically so.  Her bilirubin today is 8.0 which is in line with prior bilirubins obtained.  Her ammonia level is slightly elevated at 47 which is actually better than the last several comparison labs.  Imaging also reviewed, no fracture or dislocations found on plain films.  CT head and C-spine are stable with no intracranial injuries noted.    At reexam, patient was alert and oriented x3, she had significant improvement in her pain after receiving a dose of oxycodone.  Of note she was given 5 mg, she chronically takes 10 mg at home.  She was able to flex and extend her legs at the knees although was uncomfortable she was able to range her knees and hips.  She does have a cane, a seated walker and also a wheelchair in her home for as needed use, also states she has a hospital bed. She appears stable for dc home.   Recheck of her temperature at 99.2.  denies sob, dysuria, no fevers at home.        Final Clinical Impression(s) / ED Diagnoses Final diagnoses:  Fall, initial encounter  Musculoskeletal strain  Chronic liver disease and cirrhosis ([place])    Rx / DC  Orders ED Discharge Orders          Ordered    cyclobenzaprine (FLEXERIL) 5 MG tablet  3 times daily PRN        12/13/20 1856             Evalee Jefferson, PA-C 12/13/20 1857    Milton Ferguson, MD 12/14/20 1113

## 2020-12-13 NOTE — Discharge Instructions (Addendum)
Continue taking your home pain medication to help you with your symptoms.  I also encourage a heating pad applied to your areas of pain for 20 minutes 2-3 times daily.  Do not fall asleep on a heating pad as this can cause skin burns.  As discussed your lab test today and your plain x-rays and CT scans are stable with no sign of any internal injuries from your fall.  Use your cane, walker or wheelchair as needed for mobility until your injuries heal.  You have been prescribed a muscle relaxer which may also help relieve your symptoms.

## 2020-12-13 NOTE — ED Triage Notes (Signed)
States she fell off the couch 3 days ago, states she is unable to stand now

## 2021-02-20 ENCOUNTER — Other Ambulatory Visit: Payer: Self-pay

## 2021-02-20 ENCOUNTER — Emergency Department (HOSPITAL_COMMUNITY)
Admission: EM | Admit: 2021-02-20 | Discharge: 2021-02-21 | Disposition: A | Discharge status: Home / Self Care | Payer: Medicare HMO | Attending: Emergency Medicine | Admitting: Emergency Medicine

## 2021-02-20 ENCOUNTER — Encounter (HOSPITAL_COMMUNITY): Payer: Self-pay | Admitting: *Deleted

## 2021-02-20 DIAGNOSIS — K7460 Unspecified cirrhosis of liver: Secondary | ICD-10-CM | POA: Insufficient documentation

## 2021-02-20 DIAGNOSIS — M546 Pain in thoracic spine: Secondary | ICD-10-CM | POA: Diagnosis not present

## 2021-02-20 DIAGNOSIS — Z5321 Procedure and treatment not carried out due to patient leaving prior to being seen by health care provider: Secondary | ICD-10-CM | POA: Diagnosis not present

## 2021-02-20 DIAGNOSIS — G8929 Other chronic pain: Secondary | ICD-10-CM | POA: Insufficient documentation

## 2021-02-20 LAB — BASIC METABOLIC PANEL
Anion gap: 4 — ABNORMAL LOW (ref 5–15)
BUN: 8 mg/dL (ref 6–20)
CO2: 27 mmol/L (ref 22–32)
Calcium: 9.1 mg/dL (ref 8.9–10.3)
Chloride: 106 mmol/L (ref 98–111)
Creatinine, Ser: 0.71 mg/dL (ref 0.44–1.00)
GFR, Estimated: >60 mL/min (ref >60)
Glucose, Bld: 384 mg/dL — ABNORMAL HIGH (ref 70–99)
Potassium: 4 mmol/L (ref 3.5–5.1)
Sodium: 137 mmol/L (ref 135–145)

## 2021-02-20 LAB — CBC WITH DIFFERENTIAL/PLATELET
Abs Immature Granulocytes: 0.02 10*3/uL (ref 0.00–0.07)
Basophils Absolute: 0 10*3/uL (ref 0.0–0.1)
Basophils Relative: 1 %
Eosinophils Absolute: 0.1 10*3/uL (ref 0.0–0.5)
Eosinophils Relative: 4 %
HCT: 31.5 % — ABNORMAL LOW (ref 36.0–46.0)
Hemoglobin: 10.5 g/dL — ABNORMAL LOW (ref 12.0–15.0)
Immature Granulocytes: 1 %
Lymphocytes Relative: 20 %
Lymphs Abs: 0.6 10*3/uL — ABNORMAL LOW (ref 0.7–4.0)
MCH: 36.8 pg — ABNORMAL HIGH (ref 26.0–34.0)
MCHC: 33.3 g/dL (ref 30.0–36.0)
MCV: 110.5 fL — ABNORMAL HIGH (ref 80.0–100.0)
Monocytes Absolute: 0.3 10*3/uL (ref 0.1–1.0)
Monocytes Relative: 11 %
Neutro Abs: 1.8 10*3/uL (ref 1.7–7.7)
Neutrophils Relative %: 63 %
Platelets: 44 10*3/uL — ABNORMAL LOW (ref 150–400)
RBC: 2.85 MIL/uL — ABNORMAL LOW (ref 3.87–5.11)
RDW: 14.5 % (ref 11.5–15.5)
WBC: 2.8 10*3/uL — ABNORMAL LOW (ref 4.0–10.5)
nRBC: 0 % (ref 0.0–0.2)

## 2021-02-20 LAB — HEPATIC FUNCTION PANEL
ALT: 23 U/L (ref 0–44)
AST: 63 U/L — ABNORMAL HIGH (ref 15–41)
Albumin: 2.2 g/dL — ABNORMAL LOW (ref 3.5–5.0)
Alkaline Phosphatase: 153 U/L — ABNORMAL HIGH (ref 38–126)
Bilirubin, Direct: 3.2 mg/dL — ABNORMAL HIGH (ref 0.0–0.2)
Indirect Bilirubin: 7 mg/dL — ABNORMAL HIGH (ref 0.3–0.9)
Total Bilirubin: 10.2 mg/dL — ABNORMAL HIGH (ref 0.3–1.2)
Total Protein: 6.1 g/dL — ABNORMAL LOW (ref 6.5–8.1)

## 2021-02-20 LAB — TROPONIN I (HIGH SENSITIVITY)
Troponin I (High Sensitivity): 18 ng/L — ABNORMAL HIGH (ref <18)
Troponin I (High Sensitivity): 17 ng/L (ref <18)

## 2021-02-20 LAB — PROTIME-INR
INR: 1.9 — ABNORMAL HIGH (ref 0.8–1.2)
Prothrombin Time: 21.8 seconds — ABNORMAL HIGH (ref 11.4–15.2)

## 2021-02-20 LAB — LIPASE, BLOOD: Lipase: 46 U/L (ref 11–51)

## 2021-02-20 MED ORDER — OXYCODONE HCL 5 MG PO TABS: 10.0000 mg | ORAL_TABLET | Freq: Once | ORAL | Status: DC

## 2021-02-20 NOTE — ED Provider Notes (Signed)
Emergency Medicine Provider Triage Evaluation Note  [name] , a 55 y.o. female  was evaluated in triage.  Pt complains of midthoracic back pain for the past 2 days.  She states that it started suddenly with no traumatic event.  She says that it does not radiate anywhere.  Describes a 10 out of 10.  Lying flat makes it worse.  She is on chronic pain medication at home and it has not helped at all.  She has a history of cirrhosis and chronic pancreatitis.  Denies any fevers or chills.  Review of Systems  Positive: See above Negative:   Physical Exam  BP (!) 148/57   Pulse 78   Temp 99.2 F (37.3 C)   Resp 20   Ht 5' 3"  (1.6 m)   Wt 54 kg   SpO2 97%   BMI 21.09 kg/m  Gen:   Awake, no distress   Resp:  Normal effort  MSK:   Moves extremities without difficulty  Other:  Significant tenderness to palpation of mid thoracic back.  Some abdominal tenderness of epigastric and right upper quadrant, however patient says this is always present.  Medical Decision Making  Medically screening exam initiated at 6:45 PM.  Appropriate orders placed.  [name] was informed that the remainder of the evaluation will be completed by another provider, this initial triage assessment does not replace that evaluation, and the importance of remaining in the ED until their evaluation is complete.     [name] 02/20/21 1846    Joan Muskrat, MD 02/20/21 1850

## 2021-02-20 NOTE — ED Triage Notes (Signed)
The pt has had mid-back pain for the past 2 days  no known injury  she has had back pain previously but in a different area

## 2021-02-21 NOTE — ED Notes (Signed)
Patient left on own accord °

## 2021-03-17 ENCOUNTER — Other Ambulatory Visit: Payer: Self-pay

## 2021-03-17 ENCOUNTER — Observation Stay (HOSPITAL_COMMUNITY): Payer: Medicare HMO

## 2021-03-17 ENCOUNTER — Observation Stay (HOSPITAL_COMMUNITY)
Admission: EM | Admit: 2021-03-17 | Discharge: 2021-03-18 | Disposition: A | Discharge status: Home / Self Care | Payer: Medicare HMO | Attending: Internal Medicine | Admitting: Internal Medicine

## 2021-03-17 ENCOUNTER — Encounter (HOSPITAL_COMMUNITY): Payer: Self-pay

## 2021-03-17 DIAGNOSIS — E119 Type 2 diabetes mellitus without complications: Secondary | ICD-10-CM | POA: Insufficient documentation

## 2021-03-17 DIAGNOSIS — Z23 Encounter for immunization: Secondary | ICD-10-CM | POA: Insufficient documentation

## 2021-03-17 DIAGNOSIS — E039 Hypothyroidism, unspecified: Secondary | ICD-10-CM | POA: Diagnosis not present

## 2021-03-17 DIAGNOSIS — K7682 Hepatic encephalopathy: Secondary | ICD-10-CM | POA: Diagnosis not present

## 2021-03-17 DIAGNOSIS — J449 Chronic obstructive pulmonary disease, unspecified: Secondary | ICD-10-CM | POA: Insufficient documentation

## 2021-03-17 DIAGNOSIS — J9611 Chronic respiratory failure with hypoxia: Secondary | ICD-10-CM | POA: Diagnosis not present

## 2021-03-17 DIAGNOSIS — E1165 Type 2 diabetes mellitus with hyperglycemia: Secondary | ICD-10-CM | POA: Diagnosis not present

## 2021-03-17 DIAGNOSIS — R0902 Hypoxemia: Secondary | ICD-10-CM

## 2021-03-17 DIAGNOSIS — Z794 Long term (current) use of insulin: Secondary | ICD-10-CM | POA: Insufficient documentation

## 2021-03-17 DIAGNOSIS — Z7984 Long term (current) use of oral hypoglycemic drugs: Secondary | ICD-10-CM | POA: Diagnosis not present

## 2021-03-17 DIAGNOSIS — K7460 Unspecified cirrhosis of liver: Secondary | ICD-10-CM | POA: Diagnosis present

## 2021-03-17 DIAGNOSIS — Z79899 Other long term (current) drug therapy: Secondary | ICD-10-CM | POA: Diagnosis not present

## 2021-03-17 DIAGNOSIS — D696 Thrombocytopenia, unspecified: Secondary | ICD-10-CM | POA: Diagnosis present

## 2021-03-17 DIAGNOSIS — R42 Dizziness and giddiness: Secondary | ICD-10-CM | POA: Diagnosis present

## 2021-03-17 LAB — COMPREHENSIVE METABOLIC PANEL
ALT: 29 U/L (ref 0–44)
AST: 72 U/L — ABNORMAL HIGH (ref 15–41)
Albumin: 2.4 g/dL — ABNORMAL LOW (ref 3.5–5.0)
Alkaline Phosphatase: 152 U/L — ABNORMAL HIGH (ref 38–126)
Anion gap: 7 (ref 5–15)
BUN: 13 mg/dL (ref 6–20)
CO2: 25 mmol/L (ref 22–32)
Calcium: 8.7 mg/dL — ABNORMAL LOW (ref 8.9–10.3)
Chloride: 103 mmol/L (ref 98–111)
Creatinine, Ser: <0.3 mg/dL — ABNORMAL LOW (ref 0.44–1.00)
Glucose, Bld: 282 mg/dL — ABNORMAL HIGH (ref 70–99)
Potassium: 3.4 mmol/L — ABNORMAL LOW (ref 3.5–5.1)
Sodium: 135 mmol/L (ref 135–145)
Total Bilirubin: 13.2 mg/dL — ABNORMAL HIGH (ref 0.3–1.2)
Total Protein: 5.9 g/dL — ABNORMAL LOW (ref 6.5–8.1)

## 2021-03-17 LAB — CBC
HCT: 31.7 % — ABNORMAL LOW (ref 36.0–46.0)
Hemoglobin: 10.5 g/dL — ABNORMAL LOW (ref 12.0–15.0)
MCH: 35.6 pg — ABNORMAL HIGH (ref 26.0–34.0)
MCHC: 33.1 g/dL (ref 30.0–36.0)
MCV: 107.5 fL — ABNORMAL HIGH (ref 80.0–100.0)
Platelets: 40 10*3/uL — ABNORMAL LOW (ref 150–400)
RBC: 2.95 MIL/uL — ABNORMAL LOW (ref 3.87–5.11)
RDW: 15.1 % (ref 11.5–15.5)
WBC: 5 10*3/uL (ref 4.0–10.5)
nRBC: 0 % (ref 0.0–0.2)

## 2021-03-17 LAB — URINALYSIS, COMPLETE (UACMP) WITH MICROSCOPIC
Glucose, UA: 50 mg/dL — AB
Ketones, ur: NEGATIVE mg/dL
Leukocytes,Ua: NEGATIVE
Nitrite: POSITIVE — AB
Protein, ur: 100 mg/dL — AB
RBC / HPF: >50 RBC/hpf — ABNORMAL HIGH (ref 0–5)
Specific Gravity, Urine: 1.024 (ref 1.005–1.030)
pH: 5 (ref 5.0–8.0)

## 2021-03-17 LAB — RAPID URINE DRUG SCREEN, HOSP PERFORMED
Amphetamines: NOT DETECTED
Barbiturates: NOT DETECTED
Benzodiazepines: NOT DETECTED
Cocaine: NOT DETECTED
Opiates: POSITIVE — AB
Tetrahydrocannabinol: NOT DETECTED

## 2021-03-17 LAB — GLUCOSE, CAPILLARY
Glucose-Capillary: 265 mg/dL — ABNORMAL HIGH (ref 70–99)
Glucose-Capillary: 155 mg/dL — ABNORMAL HIGH (ref 70–99)

## 2021-03-17 LAB — PROTIME-INR
INR: 2.1 — ABNORMAL HIGH (ref 0.8–1.2)
Prothrombin Time: 23.3 seconds — ABNORMAL HIGH (ref 11.4–15.2)

## 2021-03-17 LAB — CBG MONITORING, ED: Glucose-Capillary: 255 mg/dL — ABNORMAL HIGH (ref 70–99)

## 2021-03-17 LAB — AMMONIA: Ammonia: 79 umol/L — ABNORMAL HIGH (ref 9–35)

## 2021-03-17 MED ORDER — INSULIN ASPART 100 UNIT/ML IJ SOLN
0.0000 [IU] | Freq: Three times a day (TID) | INTRAMUSCULAR | Status: DC
  Administered 2021-03-18: 08:00:00 1 [IU] via SUBCUTANEOUS
  Administered 2021-03-18: 13:00:00 5 [IU] via SUBCUTANEOUS

## 2021-03-17 MED ORDER — RIFAXIMIN 550 MG PO TABS
550.0000 mg | ORAL_TABLET | Freq: Two times a day (BID) | ORAL | Status: DC
  Administered 2021-03-18 (×2): 550 mg via ORAL
  Filled 2021-03-17 (×2): qty 1

## 2021-03-17 MED ORDER — LACTULOSE 10 GM/15ML PO SOLN
30.0000 g | Freq: Three times a day (TID) | ORAL | Status: DC
  Administered 2021-03-17 – 2021-03-18 (×3): 30 g via ORAL
  Filled 2021-03-17 (×3): qty 60

## 2021-03-17 MED ORDER — ONDANSETRON HCL 4 MG PO TABS: 4.0000 mg | ORAL_TABLET | Freq: Four times a day (QID) | ORAL | Status: DC | PRN

## 2021-03-17 MED ORDER — INSULIN ASPART 100 UNIT/ML IJ SOLN: 0.0000 [IU] | Freq: Every day | INTRAMUSCULAR | Status: DC

## 2021-03-17 MED ORDER — ONDANSETRON HCL 4 MG/2ML IJ SOLN: 4.0000 mg | Freq: Four times a day (QID) | INTRAMUSCULAR | Status: DC | PRN

## 2021-03-17 MED ORDER — INSULIN ASPART 100 UNIT/ML IJ SOLN
3.0000 [IU] | Freq: Three times a day (TID) | INTRAMUSCULAR | Status: DC
  Administered 2021-03-18 (×2): 3 [IU] via SUBCUTANEOUS

## 2021-03-17 MED ORDER — LACTULOSE 10 GM/15ML PO SOLN
30.0000 g | Freq: Once | ORAL | Status: AC
  Administered 2021-03-17: 14:00:00 30 g via ORAL
  Filled 2021-03-17: qty 60

## 2021-03-17 MED ORDER — LEVOTHYROXINE SODIUM 50 MCG PO TABS
50.0000 ug | ORAL_TABLET | Freq: Every day | ORAL | Status: DC
  Administered 2021-03-18: 05:00:00 50 ug via ORAL
  Filled 2021-03-17: qty 1

## 2021-03-17 MED ORDER — KETOROLAC TROMETHAMINE 15 MG/ML IJ SOLN
15.0000 mg | Freq: Once | INTRAMUSCULAR | Status: AC
  Administered 2021-03-17: 23:00:00 15 mg via INTRAVENOUS
  Filled 2021-03-17: qty 1

## 2021-03-17 MED ORDER — PNEUMOCOCCAL VAC POLYVALENT 25 MCG/0.5ML IJ INJ
0.5000 mL | INJECTION | INTRAMUSCULAR | Status: AC
  Administered 2021-03-18: 08:00:00 0.5 mL via INTRAMUSCULAR
  Filled 2021-03-17: qty 0.5

## 2021-03-17 MED ORDER — FUROSEMIDE 20 MG PO TABS
20.0000 mg | ORAL_TABLET | Freq: Two times a day (BID) | ORAL | Status: DC
  Administered 2021-03-17 – 2021-03-18 (×2): 20 mg via ORAL
  Filled 2021-03-17 (×2): qty 1

## 2021-03-17 MED ORDER — POTASSIUM CHLORIDE CRYS ER 20 MEQ PO TBCR
20.0000 meq | EXTENDED_RELEASE_TABLET | Freq: Two times a day (BID) | ORAL | Status: DC
  Administered 2021-03-18 (×2): 20 meq via ORAL
  Filled 2021-03-17 (×2): qty 1

## 2021-03-17 MED ORDER — MAGNESIUM OXIDE -MG SUPPLEMENT 400 (240 MG) MG PO TABS
400.0000 mg | ORAL_TABLET | Freq: Every day | ORAL | Status: DC
  Administered 2021-03-18: 08:00:00 400 mg via ORAL
  Filled 2021-03-17: qty 1

## 2021-03-17 MED ORDER — PANCRELIPASE (LIP-PROT-AMYL) 12000-38000 UNITS PO CPEP
48000.0000 [IU] | ORAL_CAPSULE | Freq: Three times a day (TID) | ORAL | Status: DC
  Administered 2021-03-18 (×2): 48000 [IU] via ORAL
  Filled 2021-03-17 (×2): qty 4

## 2021-03-17 MED ORDER — IBUPROFEN 400 MG PO TABS: 400.0000 mg | ORAL_TABLET | Freq: Once | ORAL | Status: DC

## 2021-03-17 MED ORDER — OXYCODONE HCL 5 MG PO TABS
5.0000 mg | ORAL_TABLET | Freq: Four times a day (QID) | ORAL | Status: DC | PRN
  Administered 2021-03-17: 18:00:00 5 mg via ORAL
  Filled 2021-03-17: qty 1

## 2021-03-17 NOTE — Progress Notes (Signed)
Inpatient Diabetes Program Recommendations  AACE/ADA: New Consensus Statement on Inpatient Glycemic Control (2015)  Target Ranges:  Prepandial:   less than 140 mg/dL      Peak postprandial:   less than 180 mg/dL (1-2 hours)      Critically ill patients:  140 - 180 mg/dL   Lab Results  Component Value Date   GLUCAP 255 (H) 03/17/2021   HGBA1C 5.1 06/07/2020    Review of Glycemic Control Diabetes history:  DM 2 Outpatient Diabetes medications:  NOTE: Per chart, patient has DM2 and uses an insulin pump for DM control. Patient sees Dr. Art Buff with [facility] and was last seen on 12/21/20. Per office note on 12/21/20, Current pump basal settings should be: BASAL:  12A - 12P: 0.5 units/hr 12P-12A: 0.8 units/hr Total basal 15.6 units/day Current orders for Inpatient glycemic control: No orders yet in ED  Inpatient Diabetes Program Recommendations:   Spoke with nurse regarding insulin pump and states patient is not wearing insulin pump @ this time. If patient admitted, please consider: -Semglee 12 units qd -Novolog 3 units tid meal coverage if eats 50% -Novolog correction 0-9 tid + hs 0-5 units  Thank you, Darel Hong E. Jyles Sontag, RN, MSN, CDE  Diabetes Coordinator Inpatient Glycemic Control Team Team Pager [phone] (8am-5pm) 03/17/2021 2:00 PM

## 2021-03-17 NOTE — H&P (Signed)
History and Physical  [name] GQQ:[id number] DOB: 1966/01/10 DOA: 03/17/2021   PCP: [facility] [place]   Patient coming from: Home  Chief Complaint: lethargy, confused  HPI:  [name] is a 55 y.o. female with medical history of NASH cirrhosis, diabetes mellitus, hypothyroidism, thrombocytopenia, GERD presenting with lethargy and increasing confusion.  History is supplemented by the patient's significant other at the bedside.  Apparently, the patient has had increasing lethargy and sleepiness since 03/14/2021.  She has not taken her lactulose since Christmas day.  She states that she has not completely compliant because it causes too much diarrhea.  She denies any fevers, chills, headache, chest pain, vomiting, diarrhea, hematochezia, melena, hematemesis.  She has had some nausea.  She has chronic upper abdominal pain which she states is not any worse than usual. Apparently, the patient visited [facility] ED on 03/16/2021 because of " low oxygen" and shortness of breath.  The history is difficult at best, but the patient states that she has been on oxygen for the past 4 years.  She states that " my oxygen machine is messed up".  However, she was unable to tell me the exact duration of time of which that she has been without oxygen at home.  Chest x-ray was obtained on 03/16/2021 which was negative for acute findings.  There was difficulty setting up home oxygen. ED The patient was afebrile hemodynamically stable with oxygen saturation 92-94% on room air.  BMP shows sodium 135, potassium 3.4, bicarbonate 25, BUN 13, creatinine <0.30.  AST 72, ALT 29, alk phosphatase 152, total bilirubin 13.2, ammonia 79.  WBC 5.0, hemoglobin 10.5, platelets 40,000.  EKG shows sinus rhythm with nonspecific ST ST wave changes.  Patient was admitted for further evaluation and treatment of her encephalopathy and elevated ammonia.  Assessment/Plan: Acute metabolic encephalopathy/hepatic  encephalopathy -Ammonia 79 -Start lactulose -UA and urine culture -Further work-up if no improvement  Chronic respiratory failure with hypoxia -Obtain chest x-ray -Apparently the patient is chronically on 2 L nasal cannula at home -ask TOC to try to set up home oxygen if pt qualifies  Diabetes mellitus type 2 -The patient has been previously on an insulin pump -Apparently it was not working properly, and it was taken off -To the best of their knowledge, patient and significant other states that insulin pump has been off for "few days" -NovoLog sliding scale -NovoLog 3 units with meals -Hemoglobin A1c  Chronic pain syndrome -Restart oxycodone low-dose -PDMP reviewed--patient receives oxycodone 10 mg on a monthly basis, last refill 03/12/2021  Hypothyroidism -Continue Synthroid  GERD -Start Protonix   Thrombocytopenia -Appears to be chronic -Secondary to liver cirrhosis     Past Medical History:  Diagnosis Date   Degenerative joint disease    Diabetes mellitus without complication ([place])    Gastroesophageal reflux    Pancreatitis    Panic attack    Sleep apnea    Thrombocytopenia ([place])    Past Surgical History:  Procedure Laterality Date   CHOLECYSTECTOMY     SHOULDER SURGERY     left    tongue sx. titanium screw placed to hold tongue down     TOTAL THYROIDECTOMY     Social History:  reports that she has never smoked. She has never used smokeless tobacco. She reports that she does not drink alcohol and does not use drugs.   No family history on file.   Allergies  Allergen Reactions   Acetaminophen Hives and Other (  See Comments)    Tolerates Fioricet   Cephalexin Itching, Swelling and Other (See Comments)    Tongue swells and makes throat itchy- can tolerate Zosyn   Ciprofloxacin Hives   Hydromorphone Hives   Sulfamethoxazole-Trimethoprim Shortness Of Breath, Swelling and Other (See Comments)    Tongue swells    Abilify [Aripiprazole]     "I did  noyt like the way this made me feel."     Prior to Admission medications   Medication Sig Start Date End Date Taking? Authorizing Provider  albuterol (PROVENTIL) (2.5 MG/3ML) 0.083% nebulizer solution Inhale 2.5 mg into the lungs every 6 (six) hours as needed for wheezing or shortness of breath.  04/19/19   [provider]  albuterol (VENTOLIN HFA) 108 (90 Base) MCG/ACT inhaler Inhale 2 puffs into the lungs every 4 (four) hours as needed for wheezing or shortness of breath. 04/02/20   Roxan Hockey, MD  ciprofloxacin (CIPRO) 500 MG tablet Take 500 mg by mouth 2 (two) times daily. Patient not taking: No sig reported 12/04/20   [provider]  Continuous Blood Gluc Sensor (DEXCOM G6 SENSOR) MISC Inject into the skin See admin instructions. Place a new sensor under the skin every 10 days    [provider]  cyclobenzaprine (FLEXERIL) 5 MG tablet Take 1 tablet (5 mg total) by mouth 3 (three) times daily as needed for muscle spasms. 12/13/20   Evalee Jefferson, PA-C  furosemide (LASIX) 20 MG tablet Take 20 mg by mouth in the morning and at bedtime.    [provider]  hydrOXYzine (ATARAX/VISTARIL) 25 MG tablet Take 25 mg by mouth 3 (three) times daily. 09/03/20   [provider]  insulin aspart (NOVOLOG) 100 UNIT/ML injection Via Insulin Pump 07/31/20   Johnson, Clanford L, MD  Insulin Human (INSULIN PUMP) SOLN Inject into the skin continuous. Patient not taking: Reported on 12/13/2020    [provider]  lactulose (CHRONULAC) 10 GM/15ML solution Take 45 mLs (30 g total) by mouth 3 (three) times daily. Take 30 grams (45 ml's) by mouth three to five times a day until 3 bowel movements a day are achieved, then as directed 07/31/20   Murlean Iba, MD  levothyroxine (SYNTHROID, LEVOTHROID) 50 MCG tablet Take 50 mcg by mouth daily before breakfast.  07/25/14   [provider]  lipase/protease/amylase (CREON) 12000-38000 units CPEP capsule Take 4  capsules (48,000 Units total) by mouth 3 (three) times daily before meals. 07/31/20   Johnson, Clanford L, MD  Magnesium 400 MG TABS Take 400 mg by mouth daily.    [provider]  NITROGLYCERIN PO Place 1 tablet under the tongue every 5 (five) minutes as needed (as directed, for chest pain). Patient not taking: Reported on 12/13/2020    [provider]  ondansetron (ZOFRAN ODT) 4 MG disintegrating tablet Take 1 tablet (4 mg total) by mouth See admin instructions. Dissolve 4 mg orally one to two times a day 07/31/20   Irwin Brakeman L, MD  Oxycodone HCl 10 MG TABS Take 10 mg by mouth every 6 (six) hours as needed (pain).    [provider]  OXYGEN Inhale 2-4 L/min into the lungs as needed (for shortness of breath).    [provider]  potassium chloride SA (KLOR-CON) 20 MEQ tablet Take 20 mEq by mouth 2 (two) times daily.    [provider]  pregabalin (LYRICA) 75 MG capsule Take 75 mg by mouth 3 (three) times daily. 12/04/20   [provider]  Vitamin D, Ergocalciferol, (DRISDOL) 1.25 MG (50000 UNIT) CAPS capsule Take 50,000 Units by mouth every Wednesday. 06/29/20   [provider]  XIFAXAN 550 MG TABS tablet Take 550 mg by mouth 2 (two) times daily. 05/28/20   [provider]  lisinopril (ZESTRIL) 10 MG tablet Take 10 mg by mouth daily. Patient not taking: Reported on 10/02/2019 03/29/19 10/02/19  [provider]  metFORMIN (GLUCOPHAGE) 1000 MG tablet Take 1,000 mg by mouth 2 (two) times daily with a meal.  Patient not taking: Reported on 10/02/2019  10/02/19  [provider]  metoCLOPramide (REGLAN) 10 MG tablet Take 10 mg by mouth 4 (four) times daily.  Patient not taking: Reported on 10/02/2019  10/02/19  [provider]  sertraline (ZOLOFT) 100 MG tablet Take 150 mg by mouth daily. Patient not taking: Reported on 10/02/2019 09/10/16 10/02/19  [provider]  spironolactone (ALDACTONE) 50 MG tablet  Take 50 mg by mouth daily. Patient not taking: Reported on 10/02/2019 03/27/19 10/02/19  [provider]  traZODone (DESYREL) 100 MG tablet Take 1 tablet by mouth daily. Patient not taking: Reported on 10/02/2019 09/15/16 10/02/19  [provider]    Review of Systems:  Constitutional:  No weight loss, night sweats, Fevers, chills, Head&Eyes: No headache.  No vision loss.  No eye pain or scotoma ENT:  No Difficulty swallowing,Tooth/dental problems,Sore throat,  No ear ache, post nasal drip,  Cardio-vascular:  No chest pain, Orthopnea, PND, swelling in lower extremities,  dizziness, palpitations  GI:  No   vomiting, diarrhea, loss of appetite, hematochezia, melena, heartburn, indigestion, Resp:  No shortness of breath with exertion or at rest. No cough. No coughing up of blood .No wheezing.No chest wall deformity  Skin:  no rash or lesions.  GU:  no dysuria, change in color of urine, no urgency or frequency. No flank pain.  Musculoskeletal:  No joint pain or swelling. No decreased range of motion. No back pain.  Psych:  No change in mood or affect. No depression or anxiety. Neurologic: No headache, no dysesthesia, no focal weakness, no vision loss. No syncope  Physical Exam: Vitals:   03/17/21 1130 03/17/21 1230 03/17/21 1321 03/17/21 1330  BP: (!) 144/60 127/65  118/64  Pulse: 88 84 85 83  Resp: (!) 21 12 11 15   Temp:      TempSrc:      SpO2: 93% 94% 92% 92%  Weight:      Height:       General:  A&O x 2, NAD, nontoxic, pleasant/cooperative Head/Eye: No conjunctival hemorrhage, no icterus, [place]/AT, No nystagmus ENT:  No icterus,  No thrush, good dentition, no pharyngeal exudate Neck:  No masses, no lymphadenpathy, no bruits CV:  RRR, no rub, no gallop, no S3 Lung:  CTAB, good air movement, no wheeze, no rhonchi Abdomen: soft/NT, +BS, nondistended, no peritoneal signs Ext: No cyanosis, No rashes, No petechiae, No lymphangitis, No edema Neuro: CNII-XII intact,  strength 4/5 in bilateral upper and lower extremities, no dysmetria  Labs on Admission:  Basic Metabolic Panel: Recent Labs  Lab 03/17/21 1104  NA 135  K 3.4*  CL 103  CO2 25  GLUCOSE 282*  BUN 13  CREATININE <0.30*  CALCIUM 8.7*   Liver Function Tests: Recent Labs  Lab 03/17/21 1104  AST 72*  ALT 29  ALKPHOS 152*  BILITOT 13.2*  PROT 5.9*  ALBUMIN 2.4*   No results for input(s): LIPASE, AMYLASE in the last 168 hours. Recent  Labs  Lab 03/17/21 1104  AMMONIA 79*   CBC: Recent Labs  Lab 03/17/21 1057  WBC 5.0  HGB 10.5*  HCT 31.7*  MCV 107.5*  PLT 40*   Coagulation Profile: Recent Labs  Lab 03/17/21 1104  INR 2.1*   Cardiac Enzymes: No results for input(s): CKTOTAL, CKMB, CKMBINDEX, TROPONINI in the last 168 hours. BNP: Invalid input(s): POCBNP CBG: Recent Labs  Lab 03/17/21 1032  GLUCAP 255*   Urine analysis:    Component Value Date/Time   COLORURINE YELLOW 11/30/2020 0200   APPEARANCEUR HAZY (A) 11/30/2020 0200   LABSPEC 1.025 11/30/2020 0200   PHURINE 6.0 11/30/2020 0200   GLUCOSEU 100 (A) 11/30/2020 0200   HGBUR LARGE (A) 11/30/2020 0200   BILIRUBINUR MODERATE (A) 11/30/2020 0200   KETONESUR NEGATIVE 11/30/2020 0200   PROTEINUR 30 (A) 11/30/2020 0200   UROBILINOGEN 0.2 04/13/2007 1545   NITRITE POSITIVE (A) 11/30/2020 0200   LEUKOCYTESUR SMALL (A) 11/30/2020 0200   Sepsis Labs: @LABRCNTIP (procalcitonin:4,lacticidven:4) )No results found for this or any previous visit (from the past 240 hour(s)).   Radiological Exams on Admission: No results found.  EKG: Independently reviewed. Sinus, nonspecific TWI    Time spent:60 minutes Code Status:   FULL Family Communication:  No Family at bedside Disposition Plan: expect 2 day hospitalization Consults called: none  DVT Prophylaxis:   SCDs  Orson Eva, DO  [facility] Pager [phone]  If 7PM-7AM, please contact night-coverage [email or web address] Password [email or web address] 03/17/2021,  2:09 PM

## 2021-03-17 NOTE — Plan of Care (Signed)

## 2021-03-17 NOTE — ED Provider Notes (Signed)
[facility] EMERGENCY DEPARTMENT Provider Note  CSN: [id number] Arrival date & time: 03/17/21 0957    History Chief Complaint  Patient presents with   Dizziness    [name] is a 55 y.o. female with history of cirrhosis, COPD on home oxygen was at the ED in [facility] yesterday for general weakness and confusion. Her Ammonia was 60 which is about her baseline. She was noted to be intermittently hypoxic but the remainder of her workup including CT was apparently at her baseline. They arranged for her to have home oxygen because the tanks and concentrator she had at home were not working. This morning she was weak and having difficulty standing. She denies any CP, SOB, fever. She has not had nausea or vomiting. She is not hypoxic on RA in the ED but states her SpO2 at home goes up and down a lot. She is unsure if the low SpO2 correlates with her feeling weak/dizzy.    Past Medical History:  Diagnosis Date   Degenerative joint disease    Diabetes mellitus without complication ([place])    Gastroesophageal reflux    Pancreatitis    Panic attack    Sleep apnea    Thrombocytopenia ([facility])     Past Surgical History:  Procedure Laterality Date   CHOLECYSTECTOMY     SHOULDER SURGERY     left    tongue sx. titanium screw placed to hold tongue down     TOTAL THYROIDECTOMY      No family history on file.  Social History   Tobacco Use   Smoking status: Never   Smokeless tobacco: Never  Vaping Use   Vaping Use: Never used  Substance Use Topics   Alcohol use: No   Drug use: No     Home Medications Prior to Admission medications   Medication Sig Start Date End Date Taking? Authorizing Provider  albuterol (PROVENTIL) (2.5 MG/3ML) 0.083% nebulizer solution Inhale 2.5 mg into the lungs every 6 (six) hours as needed for wheezing or shortness of breath.  04/19/19   [provider]  albuterol (VENTOLIN HFA) 108 (90 Base) MCG/ACT inhaler Inhale 2 puffs into the lungs every 4  (four) hours as needed for wheezing or shortness of breath. 04/02/20   Roxan Hockey, MD  ciprofloxacin (CIPRO) 500 MG tablet Take 500 mg by mouth 2 (two) times daily. Patient not taking: No sig reported 12/04/20   [provider]  Continuous Blood Gluc Sensor (DEXCOM G6 SENSOR) MISC Inject into the skin See admin instructions. Place a new sensor under the skin every 10 days    [provider]  cyclobenzaprine (FLEXERIL) 5 MG tablet Take 1 tablet (5 mg total) by mouth 3 (three) times daily as needed for muscle spasms. 12/13/20   Evalee Jefferson, PA-C  furosemide (LASIX) 20 MG tablet Take 20 mg by mouth in the morning and at bedtime.    [provider]  hydrOXYzine (ATARAX/VISTARIL) 25 MG tablet Take 25 mg by mouth 3 (three) times daily. 09/03/20   [provider]  insulin aspart (NOVOLOG) 100 UNIT/ML injection Via Insulin Pump 07/31/20   Johnson, Clanford L, MD  Insulin Human (INSULIN PUMP) SOLN Inject into the skin continuous. Patient not taking: Reported on 12/13/2020    [provider]  lactulose (CHRONULAC) 10 GM/15ML solution Take 45 mLs (30 g total) by mouth 3 (three) times daily. Take 30 grams (45 ml's) by mouth three to five times a day until 3 bowel movements a day  are achieved, then as directed 07/31/20   Murlean Iba, MD  levothyroxine (SYNTHROID, LEVOTHROID) 50 MCG tablet Take 50 mcg by mouth daily before breakfast.  07/25/14   [provider]  lipase/protease/amylase (CREON) 12000-38000 units CPEP capsule Take 4 capsules (48,000 Units total) by mouth 3 (three) times daily before meals. 07/31/20   Johnson, Clanford L, MD  Magnesium 400 MG TABS Take 400 mg by mouth daily.    [provider]  NITROGLYCERIN PO Place 1 tablet under the tongue every 5 (five) minutes as needed (as directed, for chest pain). Patient not taking: Reported on 12/13/2020    [provider]  ondansetron (ZOFRAN ODT) 4 MG disintegrating tablet Take 1  tablet (4 mg total) by mouth See admin instructions. Dissolve 4 mg orally one to two times a day 07/31/20   Irwin Brakeman L, MD  Oxycodone HCl 10 MG TABS Take 10 mg by mouth every 6 (six) hours as needed (pain).    [provider]  OXYGEN Inhale 2-4 L/min into the lungs as needed (for shortness of breath).    [provider]  potassium chloride SA (KLOR-CON) 20 MEQ tablet Take 20 mEq by mouth 2 (two) times daily.    [provider]  pregabalin (LYRICA) 75 MG capsule Take 75 mg by mouth 3 (three) times daily. 12/04/20   [provider]  Vitamin D, Ergocalciferol, (DRISDOL) 1.25 MG (50000 UNIT) CAPS capsule Take 50,000 Units by mouth every Wednesday. 06/29/20   [provider]  XIFAXAN 550 MG TABS tablet Take 550 mg by mouth 2 (two) times daily. 05/28/20   [provider]  lisinopril (ZESTRIL) 10 MG tablet Take 10 mg by mouth daily. Patient not taking: Reported on 10/02/2019 03/29/19 10/02/19  [provider]  metFORMIN (GLUCOPHAGE) 1000 MG tablet Take 1,000 mg by mouth 2 (two) times daily with a meal.  Patient not taking: Reported on 10/02/2019  10/02/19  [provider]  metoCLOPramide (REGLAN) 10 MG tablet Take 10 mg by mouth 4 (four) times daily.  Patient not taking: Reported on 10/02/2019  10/02/19  [provider]  sertraline (ZOLOFT) 100 MG tablet Take 150 mg by mouth daily. Patient not taking: Reported on 10/02/2019 09/10/16 10/02/19  [provider]  spironolactone (ALDACTONE) 50 MG tablet Take 50 mg by mouth daily. Patient not taking: Reported on 10/02/2019 03/27/19 10/02/19  [provider]  traZODone (DESYREL) 100 MG tablet Take 1 tablet by mouth daily. Patient not taking: Reported on 10/02/2019 09/15/16 10/02/19  [provider]     Allergies    Acetaminophen, Cephalexin, Ciprofloxacin, Hydromorphone, Sulfamethoxazole-trimethoprim, and Abilify [aripiprazole]   Review of Systems   Review of  Systems A comprehensive review of systems was completed and negative except as noted in HPI.    Physical Exam BP 118/64    Pulse 83    Temp 97.7 F (36.5 C) (Oral)    Resp 15    Ht 5' 3"  (1.6 m)    Wt 58.1 kg    SpO2 92%    BMI 22.67 kg/m   Physical Exam Vitals and nursing note reviewed.  Constitutional:      Appearance: Normal appearance.  HENT:     Head: Normocephalic and atraumatic.     Nose: Nose normal.     Mouth/Throat:     Mouth: Mucous membranes are moist.  Eyes:     Extraocular Movements: Extraocular movements intact.     Conjunctiva/sclera: Conjunctivae normal.  Cardiovascular:  Rate and Rhythm: Normal rate.  Pulmonary:     Effort: Pulmonary effort is normal.     Breath sounds: Normal breath sounds.  Abdominal:     Palpations: Abdomen is soft.     Tenderness: There is abdominal tenderness (mild diffuse). There is no guarding.  Musculoskeletal:        General: No swelling. Normal range of motion.     Cervical back: Neck supple.  Skin:    General: Skin is warm and dry.     Coloration: Skin is jaundiced.  Neurological:     General: No focal deficit present.     Mental Status: She is alert.  Psychiatric:        Mood and Affect: Mood normal.     ED Results / Procedures / Treatments   Labs (all labs ordered are listed, but only abnormal results are displayed) Labs Reviewed  CBC - Abnormal; Notable for the following components:      Result Value   RBC 2.95 (*)    Hemoglobin 10.5 (*)    HCT 31.7 (*)    MCV 107.5 (*)    MCH 35.6 (*)    Platelets 40 (*)    All other components within normal limits  COMPREHENSIVE METABOLIC PANEL - Abnormal; Notable for the following components:   Potassium 3.4 (*)    Glucose, Bld 282 (*)    Creatinine, Ser <0.30 (*)    Calcium 8.7 (*)    Total Protein 5.9 (*)    Albumin 2.4 (*)    AST 72 (*)    Alkaline Phosphatase 152 (*)    Total Bilirubin 13.2 (*)    All other components within normal limits  PROTIME-INR -  Abnormal; Notable for the following components:   Prothrombin Time 23.3 (*)    INR 2.1 (*)    All other components within normal limits  AMMONIA - Abnormal; Notable for the following components:   Ammonia 79 (*)    All other components within normal limits  CBG MONITORING, ED - Abnormal; Notable for the following components:   Glucose-Capillary 255 (*)    All other components within normal limits  URINE CULTURE  URINALYSIS, ROUTINE W REFLEX MICROSCOPIC  URINALYSIS, COMPLETE (UACMP) WITH MICROSCOPIC    EKG EKG Interpretation  Date/Time:  Wednesday March 17 2021 10:35:08 EST Ventricular Rate:  108 PR Interval:  126 QRS Duration: 94 QT Interval:  370 QTC Calculation: 495 R Axis:   82 Text Interpretation: Sinus tachycardia ST & T wave abnormality, consider inferior ischemia Abnormal ECG Since last tracing Rate faster ST-t wave abnormality NOW PRESENT Confirmed by Calvert Cantor [phone]) on 03/17/2021 10:58:01 AM  Radiology No results found.  Procedures Procedures  Medications Ordered in the ED Medications  lactulose (CHRONULAC) 10 GM/15ML solution 30 g (30 g Oral Given 03/17/21 1356)     MDM Rules/Calculators/A&P MDM Patient here with generalized weakness in setting of advanced liver disease. She is DNR. She is not interested in SNF if she does not need to be admitted. Will recheck labs to compare to yesterday.   ED Course  I have reviewed the triage vital signs and the nursing notes.  Pertinent labs & imaging results that were available during my care of the patient were reviewed by me and considered in my medical decision making (see chart for details).  Clinical Course as of 03/17/21 1356  Wed Mar 17, 2021  1200 INR is elevated, consistent with liver disease.  [CS]  1202 CMP is  similar to previous. Hyperbilirubinemia.  [CS]  2119 CBC with anemia, similar to previous, normal WBC.  [CS]  1240 Ammonia today is 79 (up from 36 at [facility] ED yesterday).  [CS]  4174  Patient still feeling weak, given worsening Ammonia, difficulty walking. Will give a dose of lactulose here and discuss with Hospitalist.  [CS]  1356 Spoke with Dr. Carles Collet, Hospitalist, who will evaluate for admission.  [CS]    Clinical Course User Index [CS] Truddie Hidden, MD    Final Clinical Impression(s) / ED Diagnoses Final diagnoses:  Hepatic encephalopathy    Rx / DC Orders ED Discharge Orders     None        Truddie Hidden, MD 03/17/21 1356

## 2021-03-17 NOTE — Progress Notes (Signed)
Patient heavily coached to take her lactulose. Provided sips of gingerale in between sips of meds. She was complaining of pain " all over ". And intermittently twitching. Provided pain med as ordered.

## 2021-03-17 NOTE — ED Triage Notes (Signed)
Pt presents to ED with complaints of dizziness and confusion started yesterday am when she woke up.

## 2021-03-18 DIAGNOSIS — K7682 Hepatic encephalopathy: Secondary | ICD-10-CM | POA: Diagnosis not present

## 2021-03-18 LAB — CBC
HCT: 33.2 % — ABNORMAL LOW (ref 36.0–46.0)
Hemoglobin: 10.4 g/dL — ABNORMAL LOW (ref 12.0–15.0)
MCH: 34.8 pg — ABNORMAL HIGH (ref 26.0–34.0)
MCHC: 31.3 g/dL (ref 30.0–36.0)
MCV: 111 fL — ABNORMAL HIGH (ref 80.0–100.0)
Platelets: 45 10*3/uL — ABNORMAL LOW (ref 150–400)
RBC: 2.99 MIL/uL — ABNORMAL LOW (ref 3.87–5.11)
RDW: 16 % — ABNORMAL HIGH (ref 11.5–15.5)
WBC: 9.6 10*3/uL (ref 4.0–10.5)
nRBC: 0 % (ref 0.0–0.2)

## 2021-03-18 LAB — FOLATE: Folate: 35.4 ng/mL (ref >5.9)

## 2021-03-18 LAB — GLUCOSE, CAPILLARY
Glucose-Capillary: 139 mg/dL — ABNORMAL HIGH (ref 70–99)
Glucose-Capillary: 271 mg/dL — ABNORMAL HIGH (ref 70–99)

## 2021-03-18 LAB — COMPREHENSIVE METABOLIC PANEL
ALT: 31 U/L (ref 0–44)
AST: 74 U/L — ABNORMAL HIGH (ref 15–41)
Albumin: 2.5 g/dL — ABNORMAL LOW (ref 3.5–5.0)
Alkaline Phosphatase: 144 U/L — ABNORMAL HIGH (ref 38–126)
Anion gap: 10 (ref 5–15)
BUN: 17 mg/dL (ref 6–20)
CO2: 25 mmol/L (ref 22–32)
Calcium: 8.7 mg/dL — ABNORMAL LOW (ref 8.9–10.3)
Chloride: 105 mmol/L (ref 98–111)
Creatinine, Ser: 0.63 mg/dL (ref 0.44–1.00)
GFR, Estimated: >60 mL/min (ref >60)
Glucose, Bld: 161 mg/dL — ABNORMAL HIGH (ref 70–99)
Potassium: 3.7 mmol/L (ref 3.5–5.1)
Sodium: 140 mmol/L (ref 135–145)
Total Bilirubin: 15.9 mg/dL — ABNORMAL HIGH (ref 0.3–1.2)
Total Protein: 6 g/dL — ABNORMAL LOW (ref 6.5–8.1)

## 2021-03-18 LAB — HEMOGLOBIN A1C
Hgb A1c MFr Bld: 5.8 % — ABNORMAL HIGH (ref 4.8–5.6)
Mean Plasma Glucose: 119.76 mg/dL

## 2021-03-18 LAB — AMMONIA: Ammonia: 53 umol/L — ABNORMAL HIGH (ref 9–35)

## 2021-03-18 LAB — VITAMIN B12: Vitamin B-12: 1591 pg/mL — ABNORMAL HIGH (ref 180–914)

## 2021-03-18 NOTE — Discharge Summary (Signed)
Physician Discharge Summary  [name]:[id number] DOB: 1965-06-15 DOA: 03/17/2021  PCP: [facility]  Admit date: 03/17/2021  Discharge date: 03/18/2021  Admitted From:Home  Disposition:  Home  Recommendations for Outpatient Follow-up:  Follow up with PCP in 1-2 weeks Encouraged compliance on home medications, specifically lactulose and discussed this with fianc to ensure that she is taking this at least 3 times a day to induce approximately 4 bowel movements daily Continue home medications as prior  Home Health: Yes with RN  Equipment/Devices: Has home 2 L nasal cannula  Discharge Condition:Stable  CODE STATUS: DNR  Diet recommendation: Heart Healthy/carb modified  Brief/Interim Summary: [name] is a 55 y.o. female with medical history of NASH cirrhosis, diabetes mellitus, hypothyroidism, thrombocytopenia, GERD presenting with lethargy and increasing confusion.  History is supplemented by the patient's significant other at the bedside.  Apparently, the patient has had increasing lethargy and sleepiness since 03/14/2021.  She has not taken her lactulose since Christmas day.  She states that she has not completely compliant because it causes too much diarrhea.  She was admitted with acute hepatic encephalopathy with ammonia level of 79 and was resumed on her lactulose with improvement into her usual range of the 50s.  She is no longer confused and is able to tell me her name, her location, and the date.  She continues to have some mild lethargy, but this is not unusual for her and she has had no other acute events during the course of the stay.  She is stable at this time for discharge and has been encouraged to remain on her lactulose as previously prescribed.  She is a high risk for readmission as she remains noncompliant with her medications.  She is, however stable for discharge at this time.  I have reiterated with her fianc that she needs to continue  taking her medications and have multiple bowel movements daily to ensure that she stays out of the hospital.  Discharge Diagnoses:  Principal Problem:   Hepatic encephalopathy Active Problems:   Thrombocytopenia (HCC)   Unspecified cirrhosis of liver ([place])   Uncontrolled type 2 diabetes mellitus with hyperglycemia, with long-term current use of insulin ([place])  Principal discharge diagnosis: Acute hepatic encephalopathy secondary to medication noncompliance.  Discharge Instructions  Discharge Instructions     Diet - low sodium heart healthy   Complete by: As directed    Increase activity slowly   Complete by: As directed       Allergies as of 03/18/2021       Reactions   Acetaminophen Hives, Other (See Comments)   Tolerates Fioricet   Cephalexin Itching, Swelling, Other (See Comments)   Tongue swells and makes throat itchy- can tolerate Zosyn   Ciprofloxacin Hives   Hydromorphone Hives   Sulfamethoxazole-trimethoprim Shortness Of Breath, Swelling, Other (See Comments)   Tongue swells   Abilify [aripiprazole]    "I did noyt like the way this made me feel."        Medication List     STOP taking these medications    ciprofloxacin 500 MG tablet Commonly known as: CIPRO   insulin pump Soln   NITROGLYCERIN PO       TAKE these medications    albuterol (2.5 MG/3ML) 0.083% nebulizer solution Commonly known as: PROVENTIL Inhale 2.5 mg into the lungs every 6 (six) hours as needed for wheezing or shortness of breath.   albuterol 108 (90 Base) MCG/ACT inhaler Commonly known as: VENTOLIN HFA Inhale  2 puffs into the lungs every 4 (four) hours as needed for wheezing or shortness of breath.   cyclobenzaprine 5 MG tablet Commonly known as: FLEXERIL Take 1 tablet (5 mg total) by mouth 3 (three) times daily as needed for muscle spasms.   Dexcom G6 Sensor Misc Inject into the skin See admin instructions. Place a new sensor under the skin every 10 days   furosemide 20  MG tablet Commonly known as: LASIX Take 20 mg by mouth in the morning and at bedtime.   hydrOXYzine 25 MG tablet Commonly known as: ATARAX Take 25 mg by mouth 3 (three) times daily.   insulin aspart 100 UNIT/ML injection Commonly known as: novoLOG Via Insulin Pump   lactulose 10 GM/15ML solution Commonly known as: CHRONULAC Take 45 mLs (30 g total) by mouth 3 (three) times daily. Take 30 grams (45 ml's) by mouth three to five times a day until 3 bowel movements a day are achieved, then as directed   levothyroxine 50 MCG tablet Commonly known as: SYNTHROID Take 50 mcg by mouth daily before breakfast.   lipase/protease/amylase 12000-38000 units Cpep capsule Commonly known as: CREON Take 4 capsules (48,000 Units total) by mouth 3 (three) times daily before meals.   Magnesium 400 MG Tabs Take 400 mg by mouth daily.   ondansetron 4 MG disintegrating tablet Commonly known as: Zofran ODT Take 1 tablet (4 mg total) by mouth See admin instructions. Dissolve 4 mg orally one to two times a day   Oxycodone HCl 10 MG Tabs Take 10 mg by mouth every 6 (six) hours as needed (pain).   OXYGEN Inhale 2-4 L/min into the lungs as needed (for shortness of breath).   potassium chloride SA 20 MEQ tablet Commonly known as: KLOR-CON M Take 20 mEq by mouth 2 (two) times daily.   pregabalin 75 MG capsule Commonly known as: LYRICA Take 75 mg by mouth 3 (three) times daily.   Vitamin D (Ergocalciferol) 1.25 MG (50000 UNIT) Caps capsule Commonly known as: DRISDOL Take 50,000 Units by mouth every Wednesday.   Xifaxan 550 MG Tabs tablet Generic drug: rifaximin Take 550 mg by mouth 2 (two) times daily.        Follow-up [place]. Schedule an appointment as soon as possible for a visit in 1 week(s).   Contact information: [address] [place] [address] [phone]                Allergies  Allergen Reactions   Acetaminophen Hives and  Other (See Comments)    Tolerates Fioricet   Cephalexin Itching, Swelling and Other (See Comments)    Tongue swells and makes throat itchy- can tolerate Zosyn   Ciprofloxacin Hives   Hydromorphone Hives   Sulfamethoxazole-Trimethoprim Shortness Of Breath, Swelling and Other (See Comments)    Tongue swells    Abilify [Aripiprazole]     "I did noyt like the way this made me feel."    Consultations: None   Procedures/Studies: DG CHEST PORT 1 VIEW  Result Date: 03/17/2021 CLINICAL DATA:  Weakness and confusion.  Hypoxia. EXAM: PORTABLE CHEST 1 VIEW COMPARISON:  03/16/2021 and 01/28/2021 FINDINGS: Coarse lung markings appear to be chronic. Prominent central vascular structures are unchanged. No focal airspace disease or lung consolidation. Heart size is within normal limits and stable. Negative for a pneumothorax. No acute bone abnormality. IMPRESSION: 1. Coarse lung markings appear to be chronic. No evidence for acute airspace disease or consolidation. 2.  Stable enlargement of the central vascular structures and cannot exclude chronic vascular congestion. Electronically Signed   By: Markus Daft M.D.   On: 03/17/2021 14:22     Discharge Exam: Vitals:   03/18/21 0643 03/18/21 0958  BP: (!) 156/65 (!) 135/58  Pulse: 93 91  Resp: 18 18  Temp: 98 F (36.7 C) 98.6 F (37 C)  SpO2: 98% 100%   Vitals:   03/18/21 0056 03/18/21 0207 03/18/21 0643 03/18/21 0958  BP: (!) 147/61 (!) 146/68 (!) 156/65 (!) 135/58  Pulse: 97 86 93 91  Resp: 19 20 18 18   Temp: 98.1 F (36.7 C) 100.2 F (37.9 C) 98 F (36.7 C) 98.6 F (37 C)  TempSrc: Oral Oral Oral Oral  SpO2: 95% 96% 98% 100%  Weight:      Height:        General: Pt is alert, awake, not in acute distress Cardiovascular: RRR, S1/S2 +, no rubs, no gallops Respiratory: CTA bilaterally, no wheezing, no rhonchi, nasal cannula oxygen Abdominal: Soft, NT, ND, bowel sounds + Extremities: no edema, no cyanosis    The results of  significant diagnostics from this hospitalization (including imaging, microbiology, ancillary and laboratory) are listed below for reference.     Microbiology: No results found for this or any previous visit (from the past 240 hour(s)).   Labs: BNP (last 3 results) Recent Labs    07/26/20 0618 07/28/20 0558  BNP 216.0* 270.3*   Basic Metabolic Panel: Recent Labs  Lab 03/17/21 1104 03/18/21 0629  NA 135 140  K 3.4* 3.7  CL 103 105  CO2 25 25  GLUCOSE 282* 161*  BUN 13 17  CREATININE <0.30* 0.63  CALCIUM 8.7* 8.7*   Liver Function Tests: Recent Labs  Lab 03/17/21 1104 03/18/21 0629  AST 72* 74*  ALT 29 31  ALKPHOS 152* 144*  BILITOT 13.2* 15.9*  PROT 5.9* 6.0*  ALBUMIN 2.4* 2.5*   No results for input(s): LIPASE, AMYLASE in the last 168 hours. Recent Labs  Lab 03/17/21 1104 03/18/21 0629  AMMONIA 79* 53*   CBC: Recent Labs  Lab 03/17/21 1057 03/18/21 0629  WBC 5.0 9.6  HGB 10.5* 10.4*  HCT 31.7* 33.2*  MCV 107.5* 111.0*  PLT 40* 45*   Cardiac Enzymes: No results for input(s): CKTOTAL, CKMB, CKMBINDEX, TROPONINI in the last 168 hours. BNP: Invalid input(s): POCBNP CBG: Recent Labs  Lab 03/17/21 1032 03/17/21 1618 03/17/21 2152 03/18/21 0732  GLUCAP 255* 265* 155* 139*   D-Dimer No results for input(s): DDIMER in the last 72 hours. Hgb A1c No results for input(s): HGBA1C in the last 72 hours. Lipid Profile No results for input(s): CHOL, HDL, LDLCALC, TRIG, CHOLHDL, LDLDIRECT in the last 72 hours. Thyroid function studies No results for input(s): TSH, T4TOTAL, T3FREE, THYROIDAB in the last 72 hours.  Invalid input(s): FREET3 Anemia work up Recent Labs    03/18/21 0629  VITAMINB12 1,591*  FOLATE 35.4   Urinalysis    Component Value Date/Time   COLORURINE YELLOW 03/17/2021 1512   APPEARANCEUR HAZY (A) 03/17/2021 1512   LABSPEC 1.024 03/17/2021 1512   PHURINE 5.0 03/17/2021 1512   GLUCOSEU 50 (A) 03/17/2021 1512   HGBUR LARGE (A)  03/17/2021 1512   BILIRUBINUR SMALL (A) 03/17/2021 1512   KETONESUR NEGATIVE 03/17/2021 1512   PROTEINUR 100 (A) 03/17/2021 1512   UROBILINOGEN 0.2 04/13/2007 1545   NITRITE POSITIVE (A) 03/17/2021 1512   LEUKOCYTESUR NEGATIVE 03/17/2021 1512   Sepsis Labs Invalid input(s): PROCALCITONIN,  WBC,  LACTICIDVEN Microbiology No results found for this or any previous visit (from the past 240 hour(s)).   Time coordinating discharge: 35 minutes  SIGNED:   Rodena Goldmann, DO [facility] 03/18/2021, 10:38 AM  If 7PM-7AM, please contact night-coverage [email or web address]

## 2021-03-18 NOTE — TOC Transition Note (Signed)
Transition of Care [facility]) - CM/SW Discharge Note   Patient Details  Name: [name] MRN: [id number] Date of Birth: Oct 07, 1965  [facility] CM/SW Contact:  Villa Herb, LCSWA Phone Number: 03/18/2021, 11:42 AM   Clinical Narrative:    [facility] consulted for possible home needs. CSW spoke with pt to complete assessment. Pt states that she lives alone but her fiance is in the home most of the time also. Pt states that she completes her ADLs independently to the best of her ability. Pt states that her fiance provides transportation when needed. CSW inquired about pts interest in [facility] services. Pt does not wish to have these services at D/C. CSW updated MD of this. Pt has a wheelchair to use in the home when needed. CSW explained to pt it may be beneficial to reach out to DSS to inquire about getting herself on the list for CAP aide if she thought this would be helpful. TOC signing off.   Final next level of care: Home/Self Care Barriers to Discharge: Barriers Resolved   Patient Goals and CMS Choice Patient states their goals for this hospitalization and ongoing recovery are:: return home CMS Medicare.gov Compare Post Acute Care list provided to:: Patient Choice offered to / list presented to : Patient  Discharge Placement                  Name of family member notified: Hodgin,Samuel    Discharge Plan and Services                                     Social Determinants of Health (SDOH) Interventions     Readmission Risk Interventions Readmission Risk Prevention Plan 07/27/2020 07/20/2020  Transportation Screening - Complete  PCP or Specialist Appt within 3-5 Days Complete -  HRI or Home Care Consult - Complete  Social Work Consult for Recovery Care Planning/Counseling - Complete  Palliative Care Screening - Not Applicable  Medication Review [employer]) - Complete  Some recent data might be hidden

## 2021-03-18 NOTE — Plan of Care (Signed)

## 2021-03-18 NOTE — Care Management Obs Status (Signed)
MEDICARE OBSERVATION STATUS NOTIFICATION   Patient Details  Name: [name] MRN: [id number] Date of Birth: April 08, 1965   Medicare Observation Status Notification Given:  Yes    Villa Herb, LCSWA 03/18/2021, 11:45 AM

## 2021-03-18 NOTE — Discharge Instructions (Signed)
Continue to take your lactulose as ordered to avoid repeat episodes.

## 2021-03-20 LAB — URINE CULTURE: Culture: 60000 — AB

## 2021-04-05 ENCOUNTER — Emergency Department (HOSPITAL_COMMUNITY): Payer: Medicare Other

## 2021-04-05 ENCOUNTER — Inpatient Hospital Stay (HOSPITAL_COMMUNITY)
Admission: EM | Admit: 2021-04-05 | Discharge: 2021-04-15 | DRG: 871 | Disposition: A | Discharge status: Home Health | Payer: Medicare Other | Attending: Family Medicine | Admitting: Family Medicine

## 2021-04-05 ENCOUNTER — Encounter (HOSPITAL_COMMUNITY): Payer: Self-pay | Admitting: *Deleted

## 2021-04-05 DIAGNOSIS — Z79899 Other long term (current) drug therapy: Secondary | ICD-10-CM

## 2021-04-05 DIAGNOSIS — E46 Unspecified protein-calorie malnutrition: Secondary | ICD-10-CM

## 2021-04-05 DIAGNOSIS — K8309 Other cholangitis: Secondary | ICD-10-CM | POA: Diagnosis present

## 2021-04-05 DIAGNOSIS — J9811 Atelectasis: Secondary | ICD-10-CM | POA: Diagnosis present

## 2021-04-05 DIAGNOSIS — E039 Hypothyroidism, unspecified: Secondary | ICD-10-CM

## 2021-04-05 DIAGNOSIS — K7460 Unspecified cirrhosis of liver: Secondary | ICD-10-CM

## 2021-04-05 DIAGNOSIS — Z20822 Contact with and (suspected) exposure to covid-19: Secondary | ICD-10-CM | POA: Diagnosis present

## 2021-04-05 DIAGNOSIS — K7210 Chronic hepatic failure without coma: Secondary | ICD-10-CM | POA: Diagnosis present

## 2021-04-05 DIAGNOSIS — E8809 Other disorders of plasma-protein metabolism, not elsewhere classified: Secondary | ICD-10-CM | POA: Diagnosis present

## 2021-04-05 DIAGNOSIS — Z9981 Dependence on supplemental oxygen: Secondary | ICD-10-CM

## 2021-04-05 DIAGNOSIS — K219 Gastro-esophageal reflux disease without esophagitis: Secondary | ICD-10-CM | POA: Diagnosis present

## 2021-04-05 DIAGNOSIS — E440 Moderate protein-calorie malnutrition: Secondary | ICD-10-CM | POA: Diagnosis present

## 2021-04-05 DIAGNOSIS — R6521 Severe sepsis with septic shock: Secondary | ICD-10-CM | POA: Diagnosis present

## 2021-04-05 DIAGNOSIS — K7030 Alcoholic cirrhosis of liver without ascites: Secondary | ICD-10-CM | POA: Diagnosis present

## 2021-04-05 DIAGNOSIS — F410 Panic disorder [episodic paroxysmal anxiety] without agoraphobia: Secondary | ICD-10-CM | POA: Diagnosis present

## 2021-04-05 DIAGNOSIS — D6959 Other secondary thrombocytopenia: Secondary | ICD-10-CM | POA: Diagnosis present

## 2021-04-05 DIAGNOSIS — Z66 Do not resuscitate: Secondary | ICD-10-CM | POA: Diagnosis present

## 2021-04-05 DIAGNOSIS — E871 Hypo-osmolality and hyponatremia: Secondary | ICD-10-CM | POA: Diagnosis present

## 2021-04-05 DIAGNOSIS — Z1612 Extended spectrum beta lactamase (ESBL) resistance: Secondary | ICD-10-CM | POA: Diagnosis present

## 2021-04-05 DIAGNOSIS — R531 Weakness: Secondary | ICD-10-CM

## 2021-04-05 DIAGNOSIS — R188 Other ascites: Secondary | ICD-10-CM

## 2021-04-05 DIAGNOSIS — Z7984 Long term (current) use of oral hypoglycemic drugs: Secondary | ICD-10-CM

## 2021-04-05 DIAGNOSIS — Z6825 Body mass index (BMI) 25.0-25.9, adult: Secondary | ICD-10-CM

## 2021-04-05 DIAGNOSIS — T380X5A Adverse effect of glucocorticoids and synthetic analogues, initial encounter: Secondary | ICD-10-CM | POA: Diagnosis present

## 2021-04-05 DIAGNOSIS — R791 Abnormal coagulation profile: Secondary | ICD-10-CM | POA: Diagnosis present

## 2021-04-05 DIAGNOSIS — A4151 Sepsis due to Escherichia coli [E. coli]: Principal | ICD-10-CM | POA: Diagnosis present

## 2021-04-05 DIAGNOSIS — E7220 Disorder of urea cycle metabolism, unspecified: Secondary | ICD-10-CM

## 2021-04-05 DIAGNOSIS — Z9049 Acquired absence of other specified parts of digestive tract: Secondary | ICD-10-CM

## 2021-04-05 DIAGNOSIS — E890 Postprocedural hypothyroidism: Secondary | ICD-10-CM | POA: Diagnosis present

## 2021-04-05 DIAGNOSIS — Z9641 Presence of insulin pump (external) (internal): Secondary | ICD-10-CM | POA: Diagnosis present

## 2021-04-05 DIAGNOSIS — D696 Thrombocytopenia, unspecified: Secondary | ICD-10-CM | POA: Diagnosis present

## 2021-04-05 DIAGNOSIS — A419 Sepsis, unspecified organism: Secondary | ICD-10-CM

## 2021-04-05 DIAGNOSIS — K7682 Hepatic encephalopathy: Secondary | ICD-10-CM

## 2021-04-05 DIAGNOSIS — T82838A Hemorrhage of vascular prosthetic devices, implants and grafts, initial encounter: Secondary | ICD-10-CM | POA: Diagnosis not present

## 2021-04-05 DIAGNOSIS — K7031 Alcoholic cirrhosis of liver with ascites: Secondary | ICD-10-CM | POA: Diagnosis present

## 2021-04-05 DIAGNOSIS — Z800 Family history of malignant neoplasm of digestive organs: Secondary | ICD-10-CM

## 2021-04-05 DIAGNOSIS — Y838 Other surgical procedures as the cause of abnormal reaction of the patient, or of later complication, without mention of misadventure at the time of the procedure: Secondary | ICD-10-CM | POA: Diagnosis not present

## 2021-04-05 DIAGNOSIS — Z95828 Presence of other vascular implants and grafts: Secondary | ICD-10-CM

## 2021-04-05 DIAGNOSIS — K7581 Nonalcoholic steatohepatitis (NASH): Secondary | ICD-10-CM | POA: Diagnosis present

## 2021-04-05 DIAGNOSIS — E872 Acidosis, unspecified: Secondary | ICD-10-CM | POA: Diagnosis present

## 2021-04-05 DIAGNOSIS — G8929 Other chronic pain: Secondary | ICD-10-CM | POA: Diagnosis present

## 2021-04-05 DIAGNOSIS — D539 Nutritional anemia, unspecified: Secondary | ICD-10-CM | POA: Diagnosis present

## 2021-04-05 DIAGNOSIS — N390 Urinary tract infection, site not specified: Secondary | ICD-10-CM | POA: Diagnosis present

## 2021-04-05 DIAGNOSIS — R1310 Dysphagia, unspecified: Secondary | ICD-10-CM | POA: Diagnosis present

## 2021-04-05 DIAGNOSIS — Z794 Long term (current) use of insulin: Secondary | ICD-10-CM

## 2021-04-05 DIAGNOSIS — E1165 Type 2 diabetes mellitus with hyperglycemia: Secondary | ICD-10-CM | POA: Diagnosis present

## 2021-04-05 DIAGNOSIS — R110 Nausea: Secondary | ICD-10-CM

## 2021-04-05 DIAGNOSIS — J9611 Chronic respiratory failure with hypoxia: Secondary | ICD-10-CM | POA: Diagnosis present

## 2021-04-05 LAB — BLOOD GAS, ARTERIAL
Acid-Base Excess: 0.6 mmol/L (ref 0.0–2.0)
Bicarbonate: 25.1 mmol/L (ref 20.0–28.0)
Drawn by: 21310
FIO2: 32
O2 Saturation: 95.3 %
Patient temperature: 37.2
pCO2 arterial: 32.7 mmHg (ref 32.0–48.0)
pH, Arterial: 7.476 — ABNORMAL HIGH (ref 7.350–7.450)
pO2, Arterial: 72.8 mmHg — ABNORMAL LOW (ref 83.0–108.0)

## 2021-04-05 LAB — CBC WITH DIFFERENTIAL/PLATELET
Abs Immature Granulocytes: 0.16 10*3/uL — ABNORMAL HIGH (ref 0.00–0.07)
Basophils Absolute: 0 10*3/uL (ref 0.0–0.1)
Basophils Relative: 0 %
Eosinophils Absolute: 0 10*3/uL (ref 0.0–0.5)
Eosinophils Relative: 0 %
HCT: 24.9 % — ABNORMAL LOW (ref 36.0–46.0)
Hemoglobin: 7.8 g/dL — ABNORMAL LOW (ref 12.0–15.0)
Immature Granulocytes: 2 %
Lymphocytes Relative: 3 %
Lymphs Abs: 0.3 10*3/uL — ABNORMAL LOW (ref 0.7–4.0)
MCH: 36.3 pg — ABNORMAL HIGH (ref 26.0–34.0)
MCHC: 31.3 g/dL (ref 30.0–36.0)
MCV: 115.8 fL — ABNORMAL HIGH (ref 80.0–100.0)
Monocytes Absolute: 1 10*3/uL (ref 0.1–1.0)
Monocytes Relative: 9 %
Neutro Abs: 8.9 10*3/uL — ABNORMAL HIGH (ref 1.7–7.7)
Neutrophils Relative %: 86 %
Platelets: 54 10*3/uL — ABNORMAL LOW (ref 150–400)
RBC: 2.15 MIL/uL — ABNORMAL LOW (ref 3.87–5.11)
RDW: 18 % — ABNORMAL HIGH (ref 11.5–15.5)
WBC: 10.4 10*3/uL (ref 4.0–10.5)
nRBC: 0 % (ref 0.0–0.2)

## 2021-04-05 LAB — COMPREHENSIVE METABOLIC PANEL
ALT: 25 U/L (ref 0–44)
AST: 82 U/L — ABNORMAL HIGH (ref 15–41)
Albumin: 1.8 g/dL — ABNORMAL LOW (ref 3.5–5.0)
Alkaline Phosphatase: 97 U/L (ref 38–126)
Anion gap: 14 (ref 5–15)
BUN: 17 mg/dL (ref 6–20)
CO2: 23 mmol/L (ref 22–32)
Calcium: 7.9 mg/dL — ABNORMAL LOW (ref 8.9–10.3)
Chloride: 95 mmol/L — ABNORMAL LOW (ref 98–111)
Creatinine, Ser: 1 mg/dL (ref 0.44–1.00)
GFR, Estimated: >60 mL/min (ref >60)
Glucose, Bld: 147 mg/dL — ABNORMAL HIGH (ref 70–99)
Potassium: 4.9 mmol/L (ref 3.5–5.1)
Sodium: 132 mmol/L — ABNORMAL LOW (ref 135–145)
Total Bilirubin: 20.7 mg/dL (ref 0.3–1.2)
Total Protein: 5.3 g/dL — ABNORMAL LOW (ref 6.5–8.1)

## 2021-04-05 LAB — APTT: aPTT: 56 seconds — ABNORMAL HIGH (ref 24–36)

## 2021-04-05 LAB — CBG MONITORING, ED: Glucose-Capillary: 116 mg/dL — ABNORMAL HIGH (ref 70–99)

## 2021-04-05 LAB — PROTIME-INR
INR: 3.4 — ABNORMAL HIGH (ref 0.8–1.2)
Prothrombin Time: 34.6 seconds — ABNORMAL HIGH (ref 11.4–15.2)

## 2021-04-05 LAB — AMMONIA: Ammonia: 31 umol/L (ref 9–35)

## 2021-04-05 MED ORDER — LACTATED RINGERS IV SOLN: INTRAVENOUS | Status: AC

## 2021-04-05 MED ORDER — CEFTRIAXONE SODIUM 1 G IJ SOLR: 2.0000 g | Freq: Once | INTRAMUSCULAR | Status: DC

## 2021-04-05 MED ORDER — SODIUM CHLORIDE 0.9 % IV SOLN
2.0000 g | Freq: Once | INTRAVENOUS | Status: AC
  Administered 2021-04-05: 2 g via INTRAVENOUS
  Filled 2021-04-05: qty 20

## 2021-04-05 MED ORDER — LACTATED RINGERS IV BOLUS (SEPSIS)
1000.0000 mL | Freq: Once | INTRAVENOUS | Status: AC
  Administered 2021-04-05: 1000 mL via INTRAVENOUS

## 2021-04-05 NOTE — Sepsis Progress Note (Signed)
Monitoring for the code sepsis protocol. °

## 2021-04-05 NOTE — ED Provider Notes (Addendum)
[facility] EMERGENCY DEPARTMENT Provider Note   CSN: [id number] Arrival date & time: 04/05/21  1836     History Chief Complaint  Patient presents with   Abdominal Pain    [name] is a 56 y.o. female with h/o chronic liver failure, diabetes, thrombocytopenia, alcoholic cirrhosis presents the emergency department for evaluation of fatigue over the past few days.  She reports some belly distention and abdominal pain, but is mainly concerned about her fatigue.  Also requesting OxyContin as she has not had this in a few days.  Patient reports last time she took her lactulose was a few days ago.  She reports she "thinks" she is taking her Macrobid for pre-existing UTI diagnosed a few days ago at [place], but is unsure as she "slept for the past 24 hours".  She denies any fever, chest pain, shortness of breath, nausea, or vomiting.  Denies any bleeding, hematochezia, dark or tarry stools, hematuria, dysuria, vaginal bleeding. She reports that she does not wear her home oxygen like she is supposed to. Additionally, when the patient was questioned about paracentesis, she reports she has never been a candidate since her platelets have been so low.   Both patient and patient's significant other poor historians.  Both were giving differing stories.   Abdominal Pain Associated symptoms: fatigue   Associated symptoms: no chest pain, no constipation, no diarrhea, no dysuria, no fever, no hematuria, no nausea, no shortness of breath, no vaginal bleeding and no vomiting   Abdominal Pain Associated symptoms: fatigue   Associated symptoms: no constipation, no diarrhea, no dysuria, no fever, no hematuria, no nausea, no shortness of breath, no vaginal bleeding and no vomiting   Abdominal Pain Associated symptoms: fatigue   Associated symptoms: no constipation, no diarrhea, no dysuria, no fever, no hematuria, no nausea, no vaginal bleeding and no vomiting   Abdominal Pain Associated symptoms:  fatigue   Associated symptoms: no constipation, no diarrhea, no dysuria, no fever, no hematuria, no nausea and no vomiting   Abdominal Pain Associated symptoms: fatigue   Associated symptoms: no constipation, no diarrhea, no dysuria, no fever, no nausea and no vomiting   Abdominal Pain Associated symptoms: fatigue   Associated symptoms: no constipation, no diarrhea, no fever, no nausea and no vomiting   Abdominal Pain Associated symptoms: fatigue   Associated symptoms: no diarrhea, no fever, no nausea and no vomiting   Abdominal Pain Associated symptoms: fatigue   Associated symptoms: no diarrhea, no fever and no vomiting   Abdominal Pain Associated symptoms: fatigue   Associated symptoms: no diarrhea and no fever   Abdominal Pain Associated symptoms: fatigue   Associated symptoms: no fever       Home Medications Prior to Admission medications   Medication Sig Start Date End Date Taking? Authorizing Provider  albuterol (PROVENTIL) (2.5 MG/3ML) 0.083% nebulizer solution Inhale 2.5 mg into the lungs every 6 (six) hours as needed for wheezing or shortness of breath.  04/19/19   [provider]  albuterol (VENTOLIN HFA) 108 (90 Base) MCG/ACT inhaler Inhale 2 puffs into the lungs every 4 (four) hours as needed for wheezing or shortness of breath. 04/02/20   Roxan Hockey, MD  Continuous Blood Gluc Sensor (DEXCOM G6 SENSOR) MISC Inject into the skin See admin instructions. Place a new sensor under the skin every 10 days    [provider]  cyclobenzaprine (FLEXERIL) 5 MG tablet Take 1 tablet (5 mg total) by mouth 3 (three) times daily as needed for  muscle spasms. 12/13/20   Evalee Jefferson, PA-C  furosemide (LASIX) 20 MG tablet Take 20 mg by mouth in the morning and at bedtime.    [provider]  hydrOXYzine (ATARAX/VISTARIL) 25 MG tablet Take 25 mg by mouth 3 (three) times daily. 09/03/20   [provider]  insulin aspart (NOVOLOG) 100 UNIT/ML injection Via  Insulin Pump 07/31/20   Johnson, Clanford L, MD  lactulose (CHRONULAC) 10 GM/15ML solution Take 45 mLs (30 g total) by mouth 3 (three) times daily. Take 30 grams (45 ml's) by mouth three to five times a day until 3 bowel movements a day are achieved, then as directed 07/31/20   Murlean Iba, MD  levothyroxine (SYNTHROID, LEVOTHROID) 50 MCG tablet Take 50 mcg by mouth daily before breakfast.  07/25/14   [provider]  lipase/protease/amylase (CREON) 12000-38000 units CPEP capsule Take 4 capsules (48,000 Units total) by mouth 3 (three) times daily before meals. 07/31/20   Johnson, Clanford L, MD  Magnesium 400 MG TABS Take 400 mg by mouth daily.    [provider]  ondansetron (ZOFRAN ODT) 4 MG disintegrating tablet Take 1 tablet (4 mg total) by mouth See admin instructions. Dissolve 4 mg orally one to two times a day 07/31/20   Irwin Brakeman L, MD  Oxycodone HCl 10 MG TABS Take 10 mg by mouth every 6 (six) hours as needed (pain).    [provider]  OXYGEN Inhale 2-4 L/min into the lungs as needed (for shortness of breath).    [provider]  potassium chloride SA (KLOR-CON) 20 MEQ tablet Take 20 mEq by mouth 2 (two) times daily.    [provider]  pregabalin (LYRICA) 75 MG capsule Take 75 mg by mouth 3 (three) times daily. 12/04/20   [provider]  Vitamin D, Ergocalciferol, (DRISDOL) 1.25 MG (50000 UNIT) CAPS capsule Take 50,000 Units by mouth every Wednesday. 06/29/20   [provider]  XIFAXAN 550 MG TABS tablet Take 550 mg by mouth 2 (two) times daily. 05/28/20   [provider]  lisinopril (ZESTRIL) 10 MG tablet Take 10 mg by mouth daily. Patient not taking: Reported on 10/02/2019 03/29/19 10/02/19  [provider]  metFORMIN (GLUCOPHAGE) 1000 MG tablet Take 1,000 mg by mouth 2 (two) times daily with a meal.  Patient not taking: Reported on 10/02/2019  10/02/19  [provider]  metoCLOPramide (REGLAN) 10  MG tablet Take 10 mg by mouth 4 (four) times daily.  Patient not taking: Reported on 10/02/2019  10/02/19  [provider]  sertraline (ZOLOFT) 100 MG tablet Take 150 mg by mouth daily. Patient not taking: Reported on 10/02/2019 09/10/16 10/02/19  [provider]  spironolactone (ALDACTONE) 50 MG tablet Take 50 mg by mouth daily. Patient not taking: Reported on 10/02/2019 03/27/19 10/02/19  [provider]  traZODone (DESYREL) 100 MG tablet Take 1 tablet by mouth daily. Patient not taking: Reported on 10/02/2019 09/15/16 10/02/19  [provider]      Allergies    Acetaminophen, Cephalexin, Ciprofloxacin, Hydromorphone, Sulfamethoxazole-trimethoprim, and Abilify [aripiprazole]    Review of Systems   Review of Systems  Constitutional:  Positive for fatigue. Negative for fever.  Respiratory:  Negative for shortness of breath.   Cardiovascular:  Negative for chest pain.  Gastrointestinal:  Positive for abdominal distention and abdominal pain. Negative for constipation, diarrhea, nausea and vomiting.  Genitourinary:  Negative for dysuria, hematuria and vaginal bleeding.  Hematological:  Does not bruise/bleed easily.  Physical Exam Updated Vital Signs BP (!) 99/38    Pulse 98    Temp (!) 101.7 F (38.7 C) (Rectal)    Resp (!) 27    SpO2 99%  Physical Exam Vitals and nursing note reviewed.  Constitutional:      Appearance: She is ill-appearing.     Comments: Jaundiced. Pleasant.  HENT:     Mouth/Throat:     Mouth: Mucous membranes are moist.  Eyes:     General: Scleral icterus present.  Cardiovascular:     Rate and Rhythm: Tachycardia present.     Heart sounds: Murmur heard.     Comments: 1+ pitting edema to bilateral lower extremities Pulmonary:     Effort: Pulmonary effort is normal.     Breath sounds: Wheezing present.     Comments: Normal effort, but patient is satting 84% on room air. No retractions, tripoding, nasal flaring, or cyanosis present.  Speaking in full sentences with ease.  Abdominal:     General: Bowel sounds are normal. There is distension.     Tenderness: There is generalized abdominal tenderness. There is no guarding or rebound.     Comments: Generalized abdominal tenderness to palpation. No palpable masses. Abdominal distention. NBS.  Skin:    Coloration: Skin is jaundiced.  Neurological:     General: No focal deficit present.    ED Results / Procedures / Treatments   Labs (all labs ordered are listed, but only abnormal results are displayed) Labs Reviewed  CBC WITH DIFFERENTIAL/PLATELET - Abnormal; Notable for the following components:      Result Value   RBC 2.15 (*)    Hemoglobin 7.8 (*)    HCT 24.9 (*)    MCV 115.8 (*)    MCH 36.3 (*)    RDW 18.0 (*)    Platelets 54 (*)    Neutro Abs 8.9 (*)    Lymphs Abs 0.3 (*)    Abs Immature Granulocytes 0.16 (*)    All other components within normal limits  COMPREHENSIVE METABOLIC PANEL - Abnormal; Notable for the following components:   Sodium 132 (*)    Chloride 95 (*)    Glucose, Bld 147 (*)    Calcium 7.9 (*)    Total Protein 5.3 (*)    Albumin 1.8 (*)    AST 82 (*)    Total Bilirubin 20.7 (*)    All other components within normal limits  BLOOD GAS, ARTERIAL - Abnormal; Notable for the following components:   pH, Arterial 7.476 (*)    pO2, Arterial 72.8 (*)    All other components within normal limits  PROTIME-INR - Abnormal; Notable for the following components:   Prothrombin Time 34.6 (*)    INR 3.4 (*)    All other components within normal limits  APTT - Abnormal; Notable for the following components:   aPTT 56 (*)    All other components within normal limits  CBG MONITORING, ED - Abnormal; Notable for the following components:   Glucose-Capillary 116 (*)    All other components within normal limits  CULTURE, BLOOD (ROUTINE X 2)  CULTURE, BLOOD (ROUTINE X 2)  URINE CULTURE  BODY FLUID CULTURE W GRAM STAIN  GRAM STAIN  RESP PANEL BY  RT-PCR (FLU A&B, COVID) ARPGX2  AMMONIA  URINALYSIS, ROUTINE W REFLEX MICROSCOPIC  SYNOVIAL CELL COUNT + DIFF, W/ CRYSTALS  GLUCOSE, BODY FLUID OTHER            LACTIC ACID, PLASMA  LACTIC ACID,  PLASMA  POC OCCULT BLOOD, ED  POC URINE PREG, ED  TYPE AND SCREEN    EKG None  Radiology DG Chest Port 1 View  Result Date: 04/05/2021 CLINICAL DATA:  Possible sepsis EXAM: PORTABLE CHEST 1 VIEW COMPARISON:  03/17/2021 FINDINGS: Cardiac shadow is enlarged but stable. Increasing vascular congestion is noted without significant edema. No focal infiltrate is seen. Mild left basilar atelectasis is noted. No bony abnormality is seen. IMPRESSION: Increasing congestive failure without definitive edema. Mild left basilar atelectasis. Electronically Signed   By: Inez Catalina M.D.   On: 04/05/2021 23:13    Procedures .Critical Care Performed by: Sherrell Puller, PA-C Authorized by: Sherrell Puller, PA-C   Critical care provider statement:    Critical care time (minutes):  74   Critical care was necessary to treat or prevent imminent or life-threatening deterioration of the following conditions:  Hepatic failure and sepsis   Critical care was time spent personally by me on the following activities:  Development of treatment plan with patient or surrogate, discussions with consultants, evaluation of patient's response to treatment, examination of patient, ordering and review of laboratory studies, ordering and review of radiographic studies, ordering and performing treatments and interventions, pulse oximetry, re-evaluation of patient's condition, review of old charts and obtaining history from patient or surrogate   I assumed direction of critical care for this patient from another provider in my specialty: no     Care discussed with: admitting provider     Hypotension, satting 84% on 2L with good pleth. Patient was speaking in full sentences with no increase work of breathing.   Medications Ordered in  ED Medications  lactated ringers infusion ( Intravenous New Bag/Given 04/05/21 2254)  norepinephrine (LEVOPHED) 76m in 2575m(0.016 mg/mL) premix infusion (12 mcg/min Intravenous Rate/Dose Change 04/06/21 0332)  vancomycin (VANCOREADY) IVPB 1250 mg/250 mL (1,250 mg Intravenous New Bag/Given 04/06/21 0259)  oxyCODONE (Oxy IR/ROXICODONE) immediate release tablet 5 mg (has no administration in time range)  lactated ringers bolus 1,000 mL (0 mLs Intravenous Stopped 04/05/21 2355)  cefTRIAXone (ROCEPHIN) 2 g in sodium chloride 0.9 % 100 mL IVPB (0 g Intravenous Stopped 04/05/21 2253)  0.9 %  sodium chloride infusion (10 mL/hr Intravenous New Bag/Given 04/06/21 0223)  oxyCODONE (Oxy IR/ROXICODONE) immediate release tablet 5 mg (5 mg Oral Given 04/06/21 0154)    ED Course/ Medical Decision Making/ A&P                           Medical Decision Making Amount and/or Complexity of Data Reviewed Labs: ordered. Radiology: ordered. ECG/medicine tests: ordered.  Risk Prescription drug management. Decision regarding hospitalization.    [age]ear old female presents emergency department for evaluation of worsening fatigue.  Differential diagnosis includes is not limited to worsening hepatic failure, anemia, bleed, sepsis, cholangitis.  Patient presented hypotensive and tachycardic and febrile at 101.7 rectally.  Patient is oriented x3 but slow to respond to these questions.  She has normal speech respond to other questions at normal times.  She speaking in full sentences with ease.  Patient is extremely jaundiced with scleral icterus.  She has a distended abdomen that has generalized tenderness to palpation.  Most likely ascites.  Heart murmur.  Wheezing heard in bilateral bases.  She is very ill-appearing.  Although the patient is alert and oriented x3, she is a poor historian.   Outside information obtained by chart investigation. On chart investigation, I discovered that the patient was seen at  [place] [facility]  on 04/01/21 for abdominal pain and distention as well as leg swelling. They had confirmed ascites, but again deemed not a candidate for emergent paracentesis given her low platelet count. She was diagnosed with a UTI and given Macrobid and sprinolactone. I had a discussed with the patients significant other who is also not a good historian or knowledgeable about the medical status of the patient.   On 04/01/21-  Her sodium was 133 now - 132 potassium 3.6 now 4.9 BUN 14 now - 17 creatinine 0.86 now -1.00 glucose 316 now -147 albumin 1.9 now- 1.8 AST 57 now - 82 ALT 24 now-25 total bili 14.4 now 20.7 !! ALK Phos 160 now - 97  I independently reviewed and interpreted the patient's labs.  Sepsis work-up was initiated.  Patient has elevated lactic at 7.2.  CMP shows hyponatremia 132.  Increased glucose at 147.  Patient has decreased calcium, protein, and ` significant hypoalbuminemia at 1.8.  Her liver enzymes are mildly elevated although her total bilirubin is 20.7.  This is increased from 14.4 four days ago.  Although the patient's creatinine is 1.00, it was 0.63 2 weeks ago.  AKI.  Ammonia 31.  CBC shows white count of 10.4 although there is a left shift..  Hemoglobin has dropped from 1.4-7.8 in 2 weeks.  Platelets at 54.  Urinalysis shows straw-colored urine with glucose, large blood, large bili, ketones, protein, positive Neri nitrates, moderate leukocytes seen in the urine.  Consistent with patient's diagnosed UTI from a few days ago.  Negative for COVID and flu.  Increased PT/INR.  INR at 3.4.  Increased APTT of 56.  Blood cultures obtained and pending.  Occult blood test done at bedside performed by myself.  No obvious dark or tarry stool.  POC occult blood negative. CXR shows increasing congestive failure without definitive edema.   Due to patient's drop in hemoglobin, type and screen placed.  Consent obtained for the patient with precautions discussed with blood transfusion.  Patient accepts  risks.  1 unit PRBC ordered.  And waiting on the patient to receive blood, mains hypotensive.  Will order Levophed at this time.  Fluids running.  MAPs are in the upper 50s.  2 g of ceftriaxone as well as vancomycin given as likely cause of SBP. Patient requesting her pain medicaiton and Oxy IR 58m ordered.   When I evaluated her in the room she was satting 84% on 2 L nasal cannula with good Pleth and the patient was speaking in full sentences with ease.  She does not endorse any shortness of breath.  At this time I increased the patient to 5 L and she moved to 97% to 98%.  After my secondary evaluation, I moved to the oxygen to 3 L ready patient has been at 93 to 94%. At this time, had a discussion with my attending and we deemed it her not a good candidate for an emergent paracentesis given her INR and platelets.  With the patient's new lab results in mind, a meld score was calculated with a score of 33 with a greater than 50% mortality risk in 3 months.  Given the need for pressors, intensivist called and spoke with Dr.Meier who agreed with plan in recommended possible right upper quadrant ultrasound to rule out cholangitis.  Ultrasound is not here late at night, but right upper quadrant ultrasound order placed.  He [facility] with CCM to  consult on.  My attending and I discussed code status with the patient who wishes to be a DNR/DNI. Levophed currently at 9.   Spoke with the hospitalist who says the patient is to unstable for admission to him currently. Will handoff to night shift physician.   3:41 AM Care of ASHWINI JAGO transferred to Dr. Dayna Barker at the end of my shift as the patient will require reassessment once labs/imaging have resulted. Patient presentation, ED course, and plan of care discussed with review of all pertinent labs and imaging. Please see his/her note for further details regarding further ED course and  disposition. Plan at time of handoff is awaiting MAPs improvement for admission to the ICU. Possibly may need central line or second pressure, but will need Goals of Care discussion at that point as the patient is a DNR/DNI. RUQ [place] placed for in the morning for cholangitis r/o. Will need follow-up possibly by day shift if the patient is not admitted by then. This may be altered or completely changed at the discretion of the oncoming team pending results of further workup.  ADDEND: The patient did not receive the full 30cc/kg of fluids because my attending and I discussed that her hypotension was likely due to her 3g hemoglobin drop over a few days in conjunction to possible SBP. She received 1L or LR while in the ER as well as a unit of blood. Her CXR showed increasing congestive failure and she had a fairly distended abdomen. Given that we could not do a paracentesis given her platelet count and INR to release some of this fluid, concern for giving her the remainder 951m of fluids was for worsening this congestion, pulmonary edema, acute CHF exacerbation, etc. On top of her already critical state. I did not want to give her more fluids and instead brought on inotropes to prevent mortality associated fluid overload seen in cirrhosis patients.   Final Clinical Impression(s) / ED Diagnoses Final diagnoses:  Cholangitis  Sepsis with acute organ dysfunction and septic shock, due to unspecified organism, unspecified type ([facility]  Chronic liver failure without hepatic coma ([facility]    Rx / DC Orders ED Discharge Orders     None         RSherrell Puller PA-C 04/06/21 0345    DGodfrey Pick MD 04/08/21 1927    RSherrell Puller PA-C 04/27/21 1740    DGodfrey Pick MD 05/01/21 1755

## 2021-04-05 NOTE — ED Notes (Signed)
Pt blood pressure trending down, Riley Ranson, PA made aware.

## 2021-04-05 NOTE — ED Triage Notes (Signed)
Confused, lethargic, history of liver failure. Sleeping for long period of time

## 2021-04-06 ENCOUNTER — Inpatient Hospital Stay (HOSPITAL_COMMUNITY): Payer: Medicare Other

## 2021-04-06 ENCOUNTER — Inpatient Hospital Stay: Payer: Self-pay

## 2021-04-06 DIAGNOSIS — E8809 Other disorders of plasma-protein metabolism, not elsewhere classified: Secondary | ICD-10-CM | POA: Diagnosis present

## 2021-04-06 DIAGNOSIS — R110 Nausea: Secondary | ICD-10-CM | POA: Diagnosis not present

## 2021-04-06 DIAGNOSIS — Z1612 Extended spectrum beta lactamase (ESBL) resistance: Secondary | ICD-10-CM | POA: Diagnosis present

## 2021-04-06 DIAGNOSIS — N390 Urinary tract infection, site not specified: Secondary | ICD-10-CM

## 2021-04-06 DIAGNOSIS — E1165 Type 2 diabetes mellitus with hyperglycemia: Secondary | ICD-10-CM | POA: Diagnosis present

## 2021-04-06 DIAGNOSIS — T82838A Hemorrhage of vascular prosthetic devices, implants and grafts, initial encounter: Secondary | ICD-10-CM | POA: Diagnosis not present

## 2021-04-06 DIAGNOSIS — Z794 Long term (current) use of insulin: Secondary | ICD-10-CM | POA: Diagnosis not present

## 2021-04-06 DIAGNOSIS — R791 Abnormal coagulation profile: Secondary | ICD-10-CM

## 2021-04-06 DIAGNOSIS — Z9981 Dependence on supplemental oxygen: Secondary | ICD-10-CM | POA: Diagnosis not present

## 2021-04-06 DIAGNOSIS — J9811 Atelectasis: Secondary | ICD-10-CM | POA: Diagnosis present

## 2021-04-06 DIAGNOSIS — E890 Postprocedural hypothyroidism: Secondary | ICD-10-CM | POA: Diagnosis present

## 2021-04-06 DIAGNOSIS — R531 Weakness: Secondary | ICD-10-CM | POA: Diagnosis not present

## 2021-04-06 DIAGNOSIS — Y838 Other surgical procedures as the cause of abnormal reaction of the patient, or of later complication, without mention of misadventure at the time of the procedure: Secondary | ICD-10-CM | POA: Diagnosis not present

## 2021-04-06 DIAGNOSIS — K7030 Alcoholic cirrhosis of liver without ascites: Secondary | ICD-10-CM | POA: Diagnosis not present

## 2021-04-06 DIAGNOSIS — E440 Moderate protein-calorie malnutrition: Secondary | ICD-10-CM | POA: Diagnosis present

## 2021-04-06 DIAGNOSIS — E46 Unspecified protein-calorie malnutrition: Secondary | ICD-10-CM

## 2021-04-06 DIAGNOSIS — Z20822 Contact with and (suspected) exposure to covid-19: Secondary | ICD-10-CM | POA: Diagnosis present

## 2021-04-06 DIAGNOSIS — A4151 Sepsis due to Escherichia coli [E. coli]: Secondary | ICD-10-CM | POA: Diagnosis present

## 2021-04-06 DIAGNOSIS — D6959 Other secondary thrombocytopenia: Secondary | ICD-10-CM | POA: Diagnosis present

## 2021-04-06 DIAGNOSIS — K219 Gastro-esophageal reflux disease without esophagitis: Secondary | ICD-10-CM | POA: Diagnosis not present

## 2021-04-06 DIAGNOSIS — Z66 Do not resuscitate: Secondary | ICD-10-CM | POA: Diagnosis present

## 2021-04-06 DIAGNOSIS — K8309 Other cholangitis: Secondary | ICD-10-CM | POA: Diagnosis present

## 2021-04-06 DIAGNOSIS — E871 Hypo-osmolality and hyponatremia: Secondary | ICD-10-CM | POA: Diagnosis present

## 2021-04-06 DIAGNOSIS — K7210 Chronic hepatic failure without coma: Secondary | ICD-10-CM | POA: Diagnosis present

## 2021-04-06 DIAGNOSIS — A419 Sepsis, unspecified organism: Secondary | ICD-10-CM | POA: Diagnosis not present

## 2021-04-06 DIAGNOSIS — D539 Nutritional anemia, unspecified: Secondary | ICD-10-CM | POA: Diagnosis present

## 2021-04-06 DIAGNOSIS — E039 Hypothyroidism, unspecified: Secondary | ICD-10-CM | POA: Diagnosis not present

## 2021-04-06 DIAGNOSIS — E7220 Disorder of urea cycle metabolism, unspecified: Secondary | ICD-10-CM | POA: Diagnosis not present

## 2021-04-06 DIAGNOSIS — K7031 Alcoholic cirrhosis of liver with ascites: Secondary | ICD-10-CM | POA: Diagnosis present

## 2021-04-06 DIAGNOSIS — J9611 Chronic respiratory failure with hypoxia: Secondary | ICD-10-CM | POA: Diagnosis present

## 2021-04-06 DIAGNOSIS — E872 Acidosis, unspecified: Secondary | ICD-10-CM | POA: Diagnosis present

## 2021-04-06 DIAGNOSIS — K7460 Unspecified cirrhosis of liver: Secondary | ICD-10-CM | POA: Diagnosis not present

## 2021-04-06 DIAGNOSIS — R6521 Severe sepsis with septic shock: Secondary | ICD-10-CM | POA: Diagnosis present

## 2021-04-06 DIAGNOSIS — Z79899 Other long term (current) drug therapy: Secondary | ICD-10-CM | POA: Diagnosis not present

## 2021-04-06 DIAGNOSIS — K7682 Hepatic encephalopathy: Secondary | ICD-10-CM | POA: Diagnosis present

## 2021-04-06 LAB — BLOOD CULTURE ID PANEL (REFLEXED) - BCID2

## 2021-04-06 LAB — URINALYSIS, ROUTINE W REFLEX MICROSCOPIC
Glucose, UA: >=500 mg/dL — AB
Ketones, ur: 15 mg/dL — AB
Nitrite: POSITIVE — AB
Protein, ur: 100 mg/dL — AB
Specific Gravity, Urine: 1.025 (ref 1.005–1.030)
pH: 6.5 (ref 5.0–8.0)

## 2021-04-06 LAB — MAGNESIUM: Magnesium: 1.4 mg/dL — ABNORMAL LOW (ref 1.7–2.4)

## 2021-04-06 LAB — LACTIC ACID, PLASMA
Lactic Acid, Venous: 4.1 mmol/L (ref 0.5–1.9)
Lactic Acid, Venous: 6 mmol/L (ref 0.5–1.9)
Lactic Acid, Venous: 7.2 mmol/L (ref 0.5–1.9)
Lactic Acid, Venous: 7.8 mmol/L (ref 0.5–1.9)

## 2021-04-06 LAB — URINALYSIS, MICROSCOPIC (REFLEX)

## 2021-04-06 LAB — GLUCOSE, CAPILLARY
Glucose-Capillary: 188 mg/dL — ABNORMAL HIGH (ref 70–99)
Glucose-Capillary: 283 mg/dL — ABNORMAL HIGH (ref 70–99)
Glucose-Capillary: 290 mg/dL — ABNORMAL HIGH (ref 70–99)
Glucose-Capillary: 298 mg/dL — ABNORMAL HIGH (ref 70–99)

## 2021-04-06 LAB — POC URINE PREG, ED: Preg Test, Ur: NEGATIVE

## 2021-04-06 LAB — HIV ANTIBODY (ROUTINE TESTING W REFLEX): HIV Screen 4th Generation wRfx: NONREACTIVE

## 2021-04-06 LAB — RESP PANEL BY RT-PCR (FLU A&B, COVID) ARPGX2
Influenza A by PCR: NEGATIVE
Influenza B by PCR: NEGATIVE
SARS Coronavirus 2 by RT PCR: NEGATIVE

## 2021-04-06 LAB — MRSA NEXT GEN BY PCR, NASAL: MRSA by PCR Next Gen: NOT DETECTED

## 2021-04-06 LAB — LIPASE, BLOOD: Lipase: 59 U/L — ABNORMAL HIGH (ref 11–51)

## 2021-04-06 LAB — VITAMIN B12: Vitamin B-12: 1442 pg/mL — ABNORMAL HIGH (ref 180–914)

## 2021-04-06 LAB — POC OCCULT BLOOD, ED: Fecal Occult Bld: NEGATIVE

## 2021-04-06 LAB — FOLATE: Folate: 20.9 ng/mL (ref >5.9)

## 2021-04-06 LAB — PHOSPHORUS: Phosphorus: 4.2 mg/dL (ref 2.5–4.6)

## 2021-04-06 MED ORDER — MAGNESIUM SULFATE 2 GM/50ML IV SOLN
2.0000 g | Freq: Once | INTRAVENOUS | Status: AC
  Administered 2021-04-06: 2 g via INTRAVENOUS
  Filled 2021-04-06: qty 50

## 2021-04-06 MED ORDER — NOREPINEPHRINE 4 MG/250ML-% IV SOLN
0.0000 ug/min | INTRAVENOUS | Status: DC
  Administered 2021-04-06: 10 ug/min via INTRAVENOUS
  Administered 2021-04-06 (×2): 12 ug/min via INTRAVENOUS
  Administered 2021-04-06: 2 ug/min via INTRAVENOUS
  Filled 2021-04-06 (×4): qty 250

## 2021-04-06 MED ORDER — SODIUM CHLORIDE 0.9% FLUSH
10.0000 mL | Freq: Two times a day (BID) | INTRAVENOUS | Status: DC
  Administered 2021-04-06 – 2021-04-15 (×18): 10 mL

## 2021-04-06 MED ORDER — PANCRELIPASE (LIP-PROT-AMYL) 12000-38000 UNITS PO CPEP
48000.0000 [IU] | ORAL_CAPSULE | Freq: Three times a day (TID) | ORAL | Status: DC
  Administered 2021-04-06 – 2021-04-15 (×26): 48000 [IU] via ORAL
  Filled 2021-04-06 (×2): qty 4
  Filled 2021-04-06: qty 1
  Filled 2021-04-06 (×3): qty 4
  Filled 2021-04-06 (×5): qty 1
  Filled 2021-04-06 (×3): qty 4
  Filled 2021-04-06 (×2): qty 1
  Filled 2021-04-06 (×6): qty 4
  Filled 2021-04-06: qty 1
  Filled 2021-04-06 (×2): qty 4
  Filled 2021-04-06: qty 1
  Filled 2021-04-06: qty 4
  Filled 2021-04-06: qty 1

## 2021-04-06 MED ORDER — INSULIN ASPART 100 UNIT/ML IJ SOLN
0.0000 [IU] | Freq: Three times a day (TID) | INTRAMUSCULAR | Status: DC
  Administered 2021-04-06: 5 [IU] via SUBCUTANEOUS
  Administered 2021-04-07: 3 [IU] via SUBCUTANEOUS
  Administered 2021-04-07: 5 [IU] via SUBCUTANEOUS
  Administered 2021-04-07: 3 [IU] via SUBCUTANEOUS
  Administered 2021-04-08: 7 [IU] via SUBCUTANEOUS
  Administered 2021-04-08 (×2): 2 [IU] via SUBCUTANEOUS
  Administered 2021-04-09 (×3): 3 [IU] via SUBCUTANEOUS
  Administered 2021-04-10: 5 [IU] via SUBCUTANEOUS
  Administered 2021-04-10 (×2): 3 [IU] via SUBCUTANEOUS
  Administered 2021-04-11: 9 [IU] via SUBCUTANEOUS
  Administered 2021-04-11: 7 [IU] via SUBCUTANEOUS
  Administered 2021-04-11: 2 [IU] via SUBCUTANEOUS
  Administered 2021-04-12 (×2): 3 [IU] via SUBCUTANEOUS
  Administered 2021-04-12: 5 [IU] via SUBCUTANEOUS
  Administered 2021-04-13: 17:00:00 3 [IU] via SUBCUTANEOUS
  Administered 2021-04-13 (×2): 5 [IU] via SUBCUTANEOUS
  Administered 2021-04-14 (×2): 2 [IU] via SUBCUTANEOUS
  Administered 2021-04-14: 18:00:00 3 [IU] via SUBCUTANEOUS
  Administered 2021-04-15: 2 [IU] via SUBCUTANEOUS
  Administered 2021-04-15: 7 [IU] via SUBCUTANEOUS

## 2021-04-06 MED ORDER — LEVOTHYROXINE SODIUM 50 MCG PO TABS
50.0000 ug | ORAL_TABLET | Freq: Every day | ORAL | Status: DC
  Administered 2021-04-07 – 2021-04-15 (×9): 50 ug via ORAL
  Filled 2021-04-06: qty 2
  Filled 2021-04-06 (×2): qty 1
  Filled 2021-04-06: qty 2
  Filled 2021-04-06 (×2): qty 1
  Filled 2021-04-06: qty 2
  Filled 2021-04-06 (×3): qty 1

## 2021-04-06 MED ORDER — SODIUM CHLORIDE 0.9 % IV SOLN
1.0000 g | Freq: Three times a day (TID) | INTRAVENOUS | Status: DC
  Administered 2021-04-06 – 2021-04-13 (×20): 1 g via INTRAVENOUS
  Filled 2021-04-06 (×20): qty 1

## 2021-04-06 MED ORDER — ALPRAZOLAM 0.25 MG PO TABS
0.2500 mg | ORAL_TABLET | Freq: Two times a day (BID) | ORAL | Status: DC | PRN
  Administered 2021-04-06 – 2021-04-12 (×6): 0.25 mg via ORAL
  Filled 2021-04-06 (×6): qty 1

## 2021-04-06 MED ORDER — OXYCODONE HCL 5 MG PO TABS
10.0000 mg | ORAL_TABLET | Freq: Four times a day (QID) | ORAL | Status: DC | PRN
  Administered 2021-04-06 – 2021-04-07 (×2): 10 mg via ORAL
  Filled 2021-04-06 (×2): qty 2

## 2021-04-06 MED ORDER — ONDANSETRON HCL 4 MG/2ML IJ SOLN
4.0000 mg | Freq: Four times a day (QID) | INTRAMUSCULAR | Status: DC | PRN
  Administered 2021-04-06 – 2021-04-15 (×4): 4 mg via INTRAVENOUS
  Filled 2021-04-06 (×4): qty 2

## 2021-04-06 MED ORDER — SODIUM CHLORIDE 0.9 % IV SOLN: 1.0000 g | INTRAVENOUS | Status: DC

## 2021-04-06 MED ORDER — LACTULOSE 10 GM/15ML PO SOLN
30.0000 g | Freq: Three times a day (TID) | ORAL | Status: DC
  Administered 2021-04-06 – 2021-04-13 (×22): 30 g via ORAL
  Filled 2021-04-06 (×22): qty 60

## 2021-04-06 MED ORDER — SODIUM CHLORIDE 0.9 % IV SOLN
1.0000 g | Freq: Once | INTRAVENOUS | Status: AC
  Administered 2021-04-06: 1 g via INTRAVENOUS
  Filled 2021-04-06: qty 1

## 2021-04-06 MED ORDER — OXYCODONE HCL 5 MG PO TABS
5.0000 mg | ORAL_TABLET | Freq: Once | ORAL | Status: AC
  Administered 2021-04-06: 5 mg via ORAL
  Filled 2021-04-06: qty 1

## 2021-04-06 MED ORDER — OXYCODONE HCL 5 MG PO TABS
5.0000 mg | ORAL_TABLET | Freq: Once | ORAL | Status: AC | PRN
  Administered 2021-04-06: 5 mg via ORAL
  Filled 2021-04-06: qty 1

## 2021-04-06 MED ORDER — INSULIN ASPART 100 UNIT/ML IJ SOLN
0.0000 [IU] | Freq: Every day | INTRAMUSCULAR | Status: DC
  Administered 2021-04-06 – 2021-04-07 (×2): 3 [IU] via SUBCUTANEOUS
  Administered 2021-04-08 – 2021-04-09 (×2): 2 [IU] via SUBCUTANEOUS
  Administered 2021-04-10 – 2021-04-11 (×2): 3 [IU] via SUBCUTANEOUS
  Administered 2021-04-12 – 2021-04-14 (×2): 2 [IU] via SUBCUTANEOUS

## 2021-04-06 MED ORDER — RIFAXIMIN 550 MG PO TABS
550.0000 mg | ORAL_TABLET | Freq: Two times a day (BID) | ORAL | Status: DC
  Administered 2021-04-06 – 2021-04-15 (×19): 550 mg via ORAL
  Filled 2021-04-06 (×19): qty 1

## 2021-04-06 MED ORDER — VANCOMYCIN HCL 1250 MG/250ML IV SOLN
1250.0000 mg | Freq: Once | INTRAVENOUS | Status: AC
  Administered 2021-04-06: 1250 mg via INTRAVENOUS
  Filled 2021-04-06: qty 250

## 2021-04-06 MED ORDER — GLUCERNA SHAKE PO LIQD
237.0000 mL | Freq: Three times a day (TID) | ORAL | Status: DC
  Administered 2021-04-06 – 2021-04-15 (×22): 237 mL via ORAL
  Filled 2021-04-06 (×5): qty 237

## 2021-04-06 MED ORDER — CHLORHEXIDINE GLUCONATE CLOTH 2 % EX PADS
6.0000 | MEDICATED_PAD | Freq: Every day | CUTANEOUS | Status: DC
  Administered 2021-04-06 – 2021-04-15 (×10): 6 via TOPICAL

## 2021-04-06 MED ORDER — SODIUM CHLORIDE 0.9 % IV SOLN
10.0000 mL/h | Freq: Once | INTRAVENOUS | Status: AC
  Administered 2021-04-06: 10 mL/h via INTRAVENOUS

## 2021-04-06 MED ORDER — SODIUM CHLORIDE 0.9% FLUSH: 10.0000 mL | INTRAVENOUS | Status: DC | PRN

## 2021-04-06 NOTE — Progress Notes (Signed)
[name] is a 56 y.o. female with medical history significant for NASH cirrhosis, type 2 diabetes mellitus, hypothyroidism, thrombocytopenia, GERD who presents to the emergency department via EMS due to several day history of lethargy and confusion.  Patient states that she has been sleeping a lot since Sunday (2 days ago) and not taking her medications including lactulose, she states that she was without energy and that she has not been eating either.  She complained of some abdominal pain due to abdominal distention.  Patient states that she was recently diagnosed with UTI at [facility] and was prescribed with Macrobid which she was not sure if she was taking since 2 days ago when she was so weak and sleepy.  She has been admitted with septic shock secondary to UTI with associated lactic acidosis.  She was previously noted to have ESBL UTI and has been started on Merrem for empiric treatment with blood and urine cultures pending.  Lactic acidosis is downtrending.  She currently remains on norepinephrine and will require PICC line placement which has been ordered for better access.  She was also noted to have symptomatic anemia and has received 1 unit PRBC with improvement in hemoglobin levels.  Stool occult ordered and pending.  Her mentation has improved this morning on evaluation and appears to be at baseline.  She will continue to receive IV fluid and monitoring of lactic acidosis.  Continue Merrem for treatment of UTI and follow cultures.  Patient was admitted after midnight and seen and evaluated at bedside.  Discussed with significant other on phone 1/17.  Total critical care time: 50 minutes.

## 2021-04-06 NOTE — H&P (Addendum)
History and Physical  [name] JQZ:[id number] DOB: [birth year] DOA: 04/05/2021  Referring physician: Sherrell Puller, Hershal Coria PCP: [facility] [place]  Patient coming from: Home  Chief Complaint: Confusion and lethargy   HPI: [name] is a 56 y.o. female with medical history significant for NASH cirrhosis, type 2 diabetes mellitus, hypothyroidism, thrombocytopenia, GERD who presents to the emergency department via EMS due to several day history of lethargy and confusion.  Patient states that she has been sleeping a lot since Sunday (2 days ago) and not taking her medications including lactulose, she states that she was without energy and that she has not been eating either.  She complained of some abdominal pain due to abdominal distention.  Patient states that she was recently diagnosed with UTI at [facility] and was prescribed with Macrobid which she was not sure if she was taking since 2 days ago when she was so weak and sleepy.  Her significant other was concerned of her lethargy and confusion, so he insisted that patient should go to the ED for further evaluation and management.  She denies chest pain, shortness of breath, fever, chills, nausea, vomiting  ED Course:  In the emergency department, she was febrile, tachycardic, tachypneic, BP was soft at 87/41 with a MAP of 56.  Work-up in the ED showed macrocytic anemia with H/H of 7.8/24.9, this was 10.4/33.2 on 03/18/2021.  BMP showed hyponatremia and hyperglycemia, INR was elevated at 3.4, albumin 1.8. Lactic acid 7.8 > 7.2, urinalysis was positive for UTI.  ABG showed hypoxia, ammonia was normal at 31.  Influenza A, B, SARS coronavirus 2 was negative. Chest x-ray showed Increasing congestive failure without definitive edema. Mild left basilar atelectasis. Patient was empirically treated with IV ceftriaxone and azithromycin.  Review of Systems: A full 10 point Review of Systems was done, except as stated above, all other  Review of systems were negative.  Past Medical History:  Diagnosis Date   Degenerative joint disease    Diabetes mellitus without complication ([place])    Gastroesophageal reflux    Pancreatitis    Panic attack    Sleep apnea    Thrombocytopenia (HCC)    Past Surgical History:  Procedure Laterality Date   CHOLECYSTECTOMY     SHOULDER SURGERY     left    tongue sx. titanium screw placed to hold tongue down     TOTAL THYROIDECTOMY      Social History:  reports that she has never smoked. She has never used smokeless tobacco. She reports that she does not drink alcohol and does not use drugs.   Allergies  Allergen Reactions   Acetaminophen Hives and Other (See Comments)    Tolerates Fioricet   Cephalexin Itching, Swelling and Other (See Comments)    Tongue swells and makes throat itchy- can tolerate Zosyn   Ciprofloxacin Hives   Hydromorphone Hives   Sulfamethoxazole-Trimethoprim Shortness Of Breath, Swelling and Other (See Comments)    Tongue swells    Abilify [Aripiprazole]     "I did noyt like the way this made me feel."    No family history on file.   Prior to Admission medications   Medication Sig Start Date End Date Taking? Authorizing Provider  albuterol (PROVENTIL) (2.5 MG/3ML) 0.083% nebulizer solution Inhale 2.5 mg into the lungs every 6 (six) hours as needed for wheezing or shortness of breath.  04/19/19   [provider]  albuterol (VENTOLIN HFA) 108 (90 Base) MCG/ACT inhaler Inhale 2 puffs into the  lungs every 4 (four) hours as needed for wheezing or shortness of breath. 04/02/20   Roxan Hockey, MD  Continuous Blood Gluc Sensor (DEXCOM G6 SENSOR) MISC Inject into the skin See admin instructions. Place a new sensor under the skin every 10 days    [provider]  cyclobenzaprine (FLEXERIL) 5 MG tablet Take 1 tablet (5 mg total) by mouth 3 (three) times daily as needed for muscle spasms. 12/13/20   Evalee Jefferson, PA-C  furosemide (LASIX) 20 MG  tablet Take 20 mg by mouth in the morning and at bedtime.    [provider]  hydrOXYzine (ATARAX/VISTARIL) 25 MG tablet Take 25 mg by mouth 3 (three) times daily. 09/03/20   [provider]  insulin aspart (NOVOLOG) 100 UNIT/ML injection Via Insulin Pump 07/31/20   Johnson, Clanford L, MD  lactulose (CHRONULAC) 10 GM/15ML solution Take 45 mLs (30 g total) by mouth 3 (three) times daily. Take 30 grams (45 ml's) by mouth three to five times a day until 3 bowel movements a day are achieved, then as directed 07/31/20   Murlean Iba, MD  levothyroxine (SYNTHROID, LEVOTHROID) 50 MCG tablet Take 50 mcg by mouth daily before breakfast.  07/25/14   [provider]  lipase/protease/amylase (CREON) 12000-38000 units CPEP capsule Take 4 capsules (48,000 Units total) by mouth 3 (three) times daily before meals. 07/31/20   Johnson, Clanford L, MD  Magnesium 400 MG TABS Take 400 mg by mouth daily.    [provider]  ondansetron (ZOFRAN ODT) 4 MG disintegrating tablet Take 1 tablet (4 mg total) by mouth See admin instructions. Dissolve 4 mg orally one to two times a day 07/31/20   Irwin Brakeman L, MD  Oxycodone HCl 10 MG TABS Take 10 mg by mouth every 6 (six) hours as needed (pain).    [provider]  OXYGEN Inhale 2-4 L/min into the lungs as needed (for shortness of breath).    [provider]  potassium chloride SA (KLOR-CON) 20 MEQ tablet Take 20 mEq by mouth 2 (two) times daily.    [provider]  pregabalin (LYRICA) 75 MG capsule Take 75 mg by mouth 3 (three) times daily. 12/04/20   [provider]  Vitamin D, Ergocalciferol, (DRISDOL) 1.25 MG (50000 UNIT) CAPS capsule Take 50,000 Units by mouth every Wednesday. 06/29/20   [provider]  XIFAXAN 550 MG TABS tablet Take 550 mg by mouth 2 (two) times daily. 05/28/20   [provider]  lisinopril (ZESTRIL) 10 MG tablet Take 10 mg by mouth daily. Patient not taking:  Reported on 10/02/2019 03/29/19 10/02/19  [provider]  metFORMIN (GLUCOPHAGE) 1000 MG tablet Take 1,000 mg by mouth 2 (two) times daily with a meal.  Patient not taking: Reported on 10/02/2019  10/02/19  [provider]  metoCLOPramide (REGLAN) 10 MG tablet Take 10 mg by mouth 4 (four) times daily.  Patient not taking: Reported on 10/02/2019  10/02/19  [provider]  sertraline (ZOLOFT) 100 MG tablet Take 150 mg by mouth daily. Patient not taking: Reported on 10/02/2019 09/10/16 10/02/19  [provider]  spironolactone (ALDACTONE) 50 MG tablet Take 50 mg by mouth daily. Patient not taking: Reported on 10/02/2019 03/27/19 10/02/19  [provider]  traZODone (DESYREL) 100 MG tablet Take 1 tablet by mouth daily. Patient not taking: Reported on 10/02/2019 09/15/16 10/02/19  [provider]    Physical Exam: BP (!) 124/48    Pulse 95    Temp  98.4 F (36.9 C) (Oral)    Resp 16    SpO2 94%   General: 56 y.o. year-old female ill appearing but in no acute distress.  Alert and oriented x3. HEENT: Icteric sclera, NCAT, EOMI Neck: Supple, trachea medial Cardiovascular: Cardia.  Regular rate and rhythm with no rubs or gallops.  No thyromegaly or JVD noted.  No lower extremity edema. 2/4 pulses in all 4 extremities. Respiratory: Tachypnea.  Clear to auscultation with no wheezes or rales. Good inspiratory effort. Abdomen: Soft, nontender, distended but with normal bowel sounds x4 quadrants. Muskuloskeletal: Digital clubbing noted in upper extremities.  No cyanosis or edema noted bilaterally Neuro: CN II-XII intact, sensation, reflexes intact Skin: Positive for jaundice.  No ulcerative lesions noted or rashes Psychiatry:  Mood is appropriate for condition and setting          Labs on Admission:  Basic Metabolic Panel: Recent Labs  Lab 04/05/21 2030  NA 132*  K 4.9  CL 95*  CO2 23  GLUCOSE 147*  BUN 17  CREATININE 1.00  CALCIUM 7.9*   Liver  Function Tests: Recent Labs  Lab 04/05/21 2030  AST 82*  ALT 25  ALKPHOS 97  BILITOT 20.7*  PROT 5.3*  ALBUMIN 1.8*   No results for input(s): LIPASE, AMYLASE in the last 168 hours. Recent Labs  Lab 04/05/21 2030  AMMONIA 31   CBC: Recent Labs  Lab 04/05/21 2030  WBC 10.4  NEUTROABS 8.9*  HGB 7.8*  HCT 24.9*  MCV 115.8*  PLT 54*   Cardiac Enzymes: No results for input(s): CKTOTAL, CKMB, CKMBINDEX, TROPONINI in the last 168 hours.  BNP (last 3 results) Recent Labs    07/26/20 0618 07/28/20 0558  BNP 216.0* 185.0*    ProBNP (last 3 results) No results for input(s): PROBNP in the last 8760 hours.  CBG: Recent Labs  Lab 04/05/21 2221  GLUCAP 116*    Radiological Exams on Admission: DG Chest Port 1 View  Result Date: 04/05/2021 CLINICAL DATA:  Possible sepsis EXAM: PORTABLE CHEST 1 VIEW COMPARISON:  03/17/2021 FINDINGS: Cardiac shadow is enlarged but stable. Increasing vascular congestion is noted without significant edema. No focal infiltrate is seen. Mild left basilar atelectasis is noted. No bony abnormality is seen. IMPRESSION: Increasing congestive failure without definitive edema. Mild left basilar atelectasis. Electronically Signed   By: Inez Catalina M.D.   On: 04/05/2021 23:13    EKG: I independently viewed the EKG done and my findings are as followed: EKG was not done in the ED  Assessment/Plan Present on Admission:  Thrombocytopenia (HCC)  Gastro-esophageal reflux disease without esophagitis  Gastroesophageal reflux  Principal Problem:   Sepsis secondary to UTI [facility]) Active Problems:   Gastroesophageal reflux   Thrombocytopenia (HCC)   Hypoalbuminemia due to protein-calorie malnutrition (HCC)   Generalized weakness   Gastro-esophageal reflux disease without esophagitis   Macrocytic anemia   Supratherapeutic INR   Hyponatremia   Hyperglycemia due to diabetes mellitus ([place])  Severe sepsis secondary to UTI Patient met severe sepsis  criteria due to being febrile, tachycardic, tachypneic with source of infection being UTI (patient appears to have been diagnosed with UTI and was on Macrobid, but it was not starting the patient very compliant with the medication).  Patient was hypotensive and was unresponsive to fluid challenge and required peripheral vasopressor to obtain MAP > 65. Patient had ESBL UTI from urine culture done in December that was resistant to ceftriaxone, but sensitive to imipenem Continue IV imipenem per pharmacy  consult Continue IV hydration Urine culture and blood culture pending  Symptomatic anemia H/H of 7.8/24.9, this was 10.4/33.2 on 03/18/2021 There was no obvious source of bleeding. 1 unit of PRBC was transfused in the ED  Generalized weakness, lethargy and confusion in the setting of above 2 Ammonia level was within normal range Continue management as described above Continue fall precaution and neurochecks  Lactic acidosis possibly due to urosepsis Lactic acid 7.8 > 7.2; continue management as described for UTI Continue to trend lactic acid  Hyperglycemia secondary to T2DM Continue ISS and hypoglycemic protocol  Hypoalbuminemia secondary to severe protein calorie malnutrition Albumin 1.8, protein supplement will be provided  Macrocytic anemia MCV 115.8, folate and vitamin B12 levels will be checked  Supratherapeutic INR INR 3.4, this is possibly due to patient's history of cirrhosis Avoid warfarin at this time  Chronic respiratory failure with hypoxia Chest x-ray showed increasing congestive failure without definitive edema Continue supplemental oxygen per home regimen (2L via )  GERD Continue Protonix  Adult hypothyroidism Continue Synthroid  Chronic thrombocytopenia secondary to liver cirrhosis Platelets 54, continue to monitor platelet levels Continue Xifaxan, lactulose, Creon   DVT prophylaxis: SCDs  Code Status: Full code  Family Communication: None at  bedside  Disposition Plan:  Patient is from:                        home Anticipated DC to:                   SNF or family members home Anticipated DC date:               2-3 days Anticipated DC barriers:          Patient requires inpatient management due to severity of illness    Consults called: None  Admission status: Inpatient    Bernadette Hoit MD [facility]  04/06/2021, 7:07 AM

## 2021-04-06 NOTE — Progress Notes (Signed)
Peripherally Inserted Central Catheter Placement  The IV Nurse has discussed with the patient and/or persons authorized to consent for the patient, the purpose of this procedure and the potential benefits and risks involved with this procedure.  The benefits include less needle sticks, lab draws from the catheter, and the patient may be discharged home with the catheter. Risks include, but not limited to, infection, bleeding, blood clot (thrombus formation), and puncture of an artery; nerve damage and irregular heartbeat and possibility to perform a PICC exchange if needed/ordered by physician.  Alternatives to this procedure were also discussed.  Bard Power PICC patient education guide, fact sheet on infection prevention and patient information card has been provided to patient /or left at bedside.    PICC Placement Documentation  PICC Double Lumen [id number] PICC Right Basilic 40 cm 0 cm (Active)  Indication for Insertion or Continuance of Line Vasoactive infusions 04/06/21 1235  Exposed Catheter (cm) 0 cm 04/06/21 1235  Site Assessment Clean;Dry;Intact 04/06/21 1235  Lumen #1 Status Flushed;Saline locked;Blood return noted 04/06/21 1235  Lumen #2 Status Flushed;Saline locked;Blood return noted 04/06/21 1235  Dressing Type Transparent;Securing device 04/06/21 1235  Dressing Status Clean;Dry;Intact 04/06/21 1235  Antimicrobial disc in place? Yes 04/06/21 1235  Safety Lock Not Applicable [id number]  Line Care Connections checked and tightened 04/06/21 1235  Dressing Intervention New dressing 04/06/21 1235  Dressing Change Due 04/13/21 04/06/21 [name] 04/06/2021, 12:49 PM

## 2021-04-06 NOTE — Progress Notes (Addendum)
Pharmacy Antibiotic Note  [name] a 56 y.o. female admitted on 04/06/2021 with UTI.  Pharmacy has been consulted for merrem dosing.  Plan: Merrem 1gm iv q8h  Medical History: Past Medical History:  Diagnosis Date   Degenerative joint disease    Diabetes mellitus without complication ([place])    Gastroesophageal reflux    Pancreatitis    Panic attack    Sleep apnea    Thrombocytopenia (HCC)     Allergies:  Allergies  Allergen Reactions   Acetaminophen Hives and Other (See Comments)    Tolerates Fioricet   Cephalexin Itching, Swelling and Other (See Comments)    Tongue swells and makes throat itchy- can tolerate Zosyn   Ciprofloxacin Hives   Hydromorphone Hives   Sulfamethoxazole-Trimethoprim Shortness Of Breath, Swelling and Other (See Comments)    Tongue swells    Tizanidine Anaphylaxis   Abilify [Aripiprazole]     "I did noyt like the way this made me feel."    [facility] Weights   04/06/21 0833  Weight: 62.3 kg (137 lb 5.6 oz)    CBC Latest Ref Rng & Units 04/05/2021 03/18/2021 03/17/2021  WBC 4.0 - 10.5 K/uL 10.4 9.6 5.0  Hemoglobin 12.0 - 15.0 g/dL 7.8(L) 10.4(L) 10.5(L)  Hematocrit 36.0 - 46.0 % 24.9(L) 33.2(L) 31.7(L)  Platelets 150 - 400 K/uL 54(L) 45(L) 40(L)     Estimated Creatinine Clearance: 51.3 mL/min (by C-G formula based on SCr of 1 mg/dL).  Antibiotics Given (last 72 hours)     Date/Time Action Medication Dose Rate   04/05/21 2253 New Bag/Given   cefTRIAXone (ROCEPHIN) 2 g in sodium chloride 0.9 % 100 mL IVPB 2 g 200 mL/hr   04/06/21 0259 New Bag/Given   vancomycin (VANCOREADY) IVPB 1250 mg/250 mL 1,250 mg 166.7 mL/hr   04/06/21 1014 Given   rifaximin (XIFAXAN) tablet 550 mg 550 mg    04/06/21 1017 New Bag/Given   meropenem (MERREM) 1 g in sodium chloride 0.9 % 100 mL IVPB 1 g 200 mL/hr       Antimicrobials this admission:  Meropenem 04/06/2021  >>  Ceftriaxone 1/16 x 1  Vancomycin 1/17 x 1   Microbiology results: 04/06/2021  BCx:  NGTD 04/06/2021  UCx: sent  04/06/2021  Resp Panel: negative    Thank you for allowing pharmacy to be a part of this patients care.  Thomasenia Sales, PharmD Clinical Pharmacist

## 2021-04-06 NOTE — TOC Initial Note (Signed)
Transition of Care [facility]) - Initial/Assessment Note    Patient Details  Name: [name] MRN: [id number] Date of Birth: 09-Jan-1966  Transition of Care [facility]) CM/SW Contact:    Leitha Bleak, RN Phone Number: 04/06/2021, 1:52 PM  Clinical Narrative:       Patient admitted with sepsis. Patient lives alone most of the time has a significant other. She has a cane, Rolator and wheelchair at home to use as needed.  TOC consulted for home health. Patient is agreeable to HHPT. She has used [place] in the past. Denyse Amass accepted the referral for HHPT. Patient will discharge in 2-3 days. Added to AVS.            Expected Discharge Plan: Home w Home Health Services Barriers to Discharge: Continued Medical Work up  Patient Goals and CMS Choice Patient states their goals for this hospitalization and ongoing recovery are:: to return home. CMS Medicare.gov Compare Post Acute Care list provided to:: Patient Choice offered to / list presented to : Patient  Expected Discharge Plan and Services Expected Discharge Plan: Home w Home Health Services     Living arrangements for the past 2 months: Single Family Home        HH Arranged: PT HH Agency: [facility] Date [facility] Agency Contacted: 04/06/21 Time HH Agency Contacted: 1151 Representative spoke with at [facility] Agency: Denyse Amass  Prior Living Arrangements/Services Living arrangements for the past 2 months: Single Family Home Lives with:: Self Patient language and need for interpreter reviewed:: Yes        Need for Family Participation in Patient Care: Yes (Comment) Care giver support system in place?: Yes (comment) Current home services: DME Criminal Activity/Legal Involvement Pertinent to Current Situation/Hospitalization: No - Comment as needed  Activities of Daily Living Home Assistive Devices/Equipment: Cane (specify quad or straight), Oxygen, Wheelchair, Environmental consultant (specify type), Bedside commode/3-in-1, Shower chair with back ADL  Screening (condition at time of admission) Patient's cognitive ability adequate to safely complete daily activities?: Yes Is the patient deaf or have difficulty hearing?: No Does the patient have difficulty seeing, even when wearing glasses/contacts?: No Does the patient have difficulty concentrating, remembering, or making decisions?: No Patient able to express need for assistance with ADLs?: Yes Does the patient have difficulty dressing or bathing?: Yes Independently performs ADLs?: No Communication: Independent Dressing (OT): Needs assistance Is this a change from baseline?: Pre-admission baseline Grooming: Needs assistance Is this a change from baseline?: Pre-admission baseline Feeding: Independent Bathing: Needs assistance Is this a change from baseline?: Pre-admission baseline Toileting: Needs assistance Is this a change from baseline?: Pre-admission baseline In/Out Bed: Needs assistance Is this a change from baseline?: Pre-admission baseline Walks in Home: Needs assistance (assistive equipment) Is this a change from baseline?: Pre-admission baseline Does the patient have difficulty walking or climbing stairs?: Yes Weakness of Legs: Both Weakness of Arms/Hands: None  Permission Sought/Granted  Emotional Assessment     Affect (typically observed): Accepting Orientation: : Oriented to Self, Oriented to Place, Oriented to  Time, Oriented to Situation Alcohol / Substance Use: Not Applicable Psych Involvement: No (comment)  Admission diagnosis:  Cholangitis [K83.09] Sepsis due to undetermined organism [facility]) [A41.9] Chronic liver failure without hepatic coma (HCC) [K72.10] Sepsis with acute organ dysfunction and septic shock, due to unspecified organism, unspecified type (HCC) [A41.9, R65.21] Patient Active Problem List   Diagnosis Date Noted   Sepsis secondary to UTI (HCC) 04/06/2021   Supratherapeutic INR 04/06/2021   Hyponatremia 04/06/2021   Hyperglycemia due to  diabetes mellitus (HCC) 04/06/2021   Hepatic encephalopathy 03/17/2021   Hypoxia    Acute hypoxemic respiratory failure (HCC)    Pressure injury of skin 07/28/2020   Malnutrition of moderate degree 07/21/2020   Sepsis (HCC) 07/19/2020   Acute lower UTI 07/19/2020   DNR (do not resuscitate) 07/19/2020   Pancreatic insufficiency 07/19/2020   Chronic liver failure without hepatic coma (HCC)    Physical deconditioning    Macrocytic anemia    Coagulopathy (HCC)    Ascites 03/30/2020   Abnormal weight gain 03/30/2020   Acute on chronic alteration in mental status 02/23/2020   Heart murmur, systolic 02/23/2020   Pneumonia involving right lung 02/23/2020   Distended abdomen 02/18/2020   Increased ammonia level 02/18/2020   Uncontrolled diabetes mellitus 12/05/2019   Valvular heart disease 11/02/2019   Major depressive disorder, single episode, severe with psychotic features (HCC) 10/04/2019   Severe recurrent major depressive disorder with psychotic features (HCC) 10/04/2019   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   Encephalopathy, portal systemic 08/18/2019   Abnormal head CT 04/02/2019   Multiple falls 04/02/2019   Stroke-like symptoms 04/02/2019   Panic attack 03/30/2019   History of COPD 03/20/2019   Chronic abdominal pain 02/05/2019   DKA (diabetic ketoacidoses) 01/26/2019   DKA (diabetic ketoacidosis) (HCC) 01/26/2019   Sleep apnea    Gastroesophageal reflux    Hepatitis A 01/13/2019   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HCC) 01/12/2019   COVID-19 01/12/2019   Glycosuria 01/12/2019   Serum total bilirubin elevated 01/12/2019   Hypoalbuminemia due to protein-calorie malnutrition (HCC) 01/12/2019   Normocytic anemia, not due to blood loss 01/12/2019   Uncontrolled type 2 diabetes mellitus with hyperglycemia, with long-term current use of insulin (HCC) 01/12/2019   Low TSH level 01/12/2019   Chest pain 12/16/2018   Generalized weakness 12/16/2018   Weight loss 12/16/2018    Schizoaffective disorder (HCC) 03/10/2018   Essential hypertension 03/09/2018   H/O partial thyroidectomy 10/25/2017   Thyroid nodule 10/25/2017   Adult hypothyroidism 10/25/2017   Cricopharyngeal hypertrophy 09/26/2017   Pharyngoesophageal dysphagia 09/26/2017   Reaction to severe stress 09/11/2017   Left hemiparesis (HCC) 04/18/2017   Disorder of shoulder 03/03/2017   Lactic acidosis 09/27/2016   Pancytopenia (HCC) 09/27/2016   Acquired hypothyroidism 09/27/2016   Microalbuminuria due to type 2 diabetes mellitus (HCC) 04/20/2016   Diabetic peripheral neuropathy associated with type 2 diabetes mellitus (HCC) 12/03/2015   Hepatic steatosis 12/03/2015   Diabetes mellitus without complication (HCC) 03/20/2015   Moderate episode of recurrent major depressive disorder (HCC) 03/20/2015   UTI (urinary tract infection) 12/25/2014   Vaginitis and vulvovaginitis 11/06/2014   Blurry vision, bilateral 10/17/2014   Health care maintenance 10/17/2014   Nausea 10/17/2014   Multinodular thyroid 07/01/2014   Unspecified cirrhosis of liver (HCC) 03/05/2014   Postoperative hypothyroidism 03/05/2014   Type 2 diabetes mellitus (HCC) 03/05/2014   Thrombocytopenia (HCC) 01/17/2014   Alcoholic cirrhosis (HCC) 01/17/2014   Back problem 05/28/2013   Low back pain 04/12/2013   Borderline personality disorder (HCC) 08/25/2012   Depression 08/25/2012   Hypercholesterolemia 08/25/2012   Osteoarthrosis 08/25/2012   Gastro-esophageal reflux disease without esophagitis 08/25/2012   Obstructive sleep apnea 08/25/2012   PCP:  [facility]   [facility] [place], [place] - [address] [place] [id number] Phone: [phone] Fax: [phone]  Readmission Risk Interventions Readmission Risk Prevention Plan 04/06/2021 07/27/2020 07/20/2020  Transportation Screening Complete - Complete  PCP  or Specialist Appt within 3-5 Days - Complete -  HRI or Home Care  Consult Complete - Complete  Social Work Consult for Recovery Care Planning/Counseling Complete - Complete  Palliative Care Screening Not Applicable - Not Applicable  Medication Review (RN Care Manager) Complete - Complete  Some recent data might be hidden

## 2021-04-07 DIAGNOSIS — K7030 Alcoholic cirrhosis of liver without ascites: Secondary | ICD-10-CM

## 2021-04-07 LAB — AMMONIA: Ammonia: 94 umol/L — ABNORMAL HIGH (ref 9–35)

## 2021-04-07 LAB — LACTIC ACID, PLASMA: Lactic Acid, Venous: 2.8 mmol/L (ref 0.5–1.9)

## 2021-04-07 LAB — COMPREHENSIVE METABOLIC PANEL
ALT: 27 U/L (ref 0–44)
AST: 57 U/L — ABNORMAL HIGH (ref 15–41)
Albumin: <1.5 g/dL — ABNORMAL LOW (ref 3.5–5.0)
Alkaline Phosphatase: 67 U/L (ref 38–126)
Anion gap: 6 (ref 5–15)
BUN: 29 mg/dL — ABNORMAL HIGH (ref 6–20)
CO2: 26 mmol/L (ref 22–32)
Calcium: 7.8 mg/dL — ABNORMAL LOW (ref 8.9–10.3)
Chloride: 97 mmol/L — ABNORMAL LOW (ref 98–111)
Creatinine, Ser: 0.73 mg/dL (ref 0.44–1.00)
GFR, Estimated: >60 mL/min (ref >60)
Glucose, Bld: 283 mg/dL — ABNORMAL HIGH (ref 70–99)
Potassium: 3.9 mmol/L (ref 3.5–5.1)
Sodium: 129 mmol/L — ABNORMAL LOW (ref 135–145)
Total Bilirubin: 17.7 mg/dL — ABNORMAL HIGH (ref 0.3–1.2)
Total Protein: 3.8 g/dL — ABNORMAL LOW (ref 6.5–8.1)

## 2021-04-07 LAB — CBC
HCT: 19.7 % — ABNORMAL LOW (ref 36.0–46.0)
Hemoglobin: 6.5 g/dL — CL (ref 12.0–15.0)
MCH: 35.5 pg — ABNORMAL HIGH (ref 26.0–34.0)
MCHC: 33 g/dL (ref 30.0–36.0)
MCV: 107.7 fL — ABNORMAL HIGH (ref 80.0–100.0)
Platelets: 40 10*3/uL — ABNORMAL LOW (ref 150–400)
RBC: 1.83 MIL/uL — ABNORMAL LOW (ref 3.87–5.11)
RDW: 19.1 % — ABNORMAL HIGH (ref 11.5–15.5)
WBC: 6.2 10*3/uL (ref 4.0–10.5)
nRBC: 0 % (ref 0.0–0.2)

## 2021-04-07 LAB — GLUCOSE, CAPILLARY
Glucose-Capillary: 273 mg/dL — ABNORMAL HIGH (ref 70–99)
Glucose-Capillary: 242 mg/dL — ABNORMAL HIGH (ref 70–99)
Glucose-Capillary: 216 mg/dL — ABNORMAL HIGH (ref 70–99)
Glucose-Capillary: 272 mg/dL — ABNORMAL HIGH (ref 70–99)

## 2021-04-07 LAB — PREPARE RBC (CROSSMATCH)

## 2021-04-07 LAB — MAGNESIUM: Magnesium: 1.8 mg/dL (ref 1.7–2.4)

## 2021-04-07 MED ORDER — VANCOMYCIN HCL 750 MG/150ML IV SOLN
750.0000 mg | Freq: Two times a day (BID) | INTRAVENOUS | Status: DC
  Administered 2021-04-07 – 2021-04-08 (×2): 750 mg via INTRAVENOUS
  Filled 2021-04-07 (×2): qty 150

## 2021-04-07 MED ORDER — VANCOMYCIN HCL 1250 MG/250ML IV SOLN
1250.0000 mg | Freq: Once | INTRAVENOUS | Status: AC
Start: 2021-04-07 — End: 2021-04-07
  Administered 2021-04-07: 1250 mg via INTRAVENOUS
  Filled 2021-04-07: qty 250

## 2021-04-07 MED ORDER — OXYCODONE HCL 5 MG PO TABS
5.0000 mg | ORAL_TABLET | Freq: Four times a day (QID) | ORAL | Status: DC | PRN
  Administered 2021-04-08 – 2021-04-15 (×11): 5 mg via ORAL
  Filled 2021-04-07 (×11): qty 1

## 2021-04-07 MED ORDER — SODIUM CHLORIDE 0.9% IV SOLUTION: Freq: Once | INTRAVENOUS | Status: AC

## 2021-04-07 NOTE — Progress Notes (Signed)
Pharmacy Antibiotic Note  [name] a 56 y.o. female admitted on 04/07/2021 with [facility] has been consulted for Vancomycin and merrem dosing. F/U cxs ID and sensitivities. BCID + Staph species  Plan: Vancomycin 1250mg  IV now then 750 mg IV Q 12 hrs. Goal AUC 400-550. Expected AUC: 489 SCr used: 0.8 (actual SCR is 0.73)  Continue Merrem 1gm iv q8h F/U cxs and clinical progress Monitor V/S, labs and levels as indicated  Medical History: Past Medical History:  Diagnosis Date   Degenerative joint disease    Diabetes mellitus without complication (HCC)    Gastroesophageal reflux    Pancreatitis    Panic attack    Sleep apnea    Thrombocytopenia (HCC)     Allergies:  Allergies  Allergen Reactions   Acetaminophen Hives and Other (See Comments)    Tolerates Fioricet   Cephalexin Itching, Swelling and Other (See Comments)    Tongue swells and makes throat itchy- can tolerate Zosyn   Ciprofloxacin Hives   Hydromorphone Hives   Sulfamethoxazole-Trimethoprim Shortness Of Breath, Swelling and Other (See Comments)    Tongue swells    Tizanidine Anaphylaxis   Abilify [Aripiprazole]     "I did noyt like the way this made me feel."    [facility] Weights   04/06/21 0833 04/07/21 0543  Weight: 62.3 kg (137 lb 5.6 oz) 66 kg (145 lb 8.1 oz)    CBC Latest Ref Rng & Units 04/07/2021 04/05/2021 03/18/2021  WBC 4.0 - 10.5 K/uL 6.2 10.4 9.6  Hemoglobin 12.0 - 15.0 g/dL 6.5(LL) 7.8(L) 10.4(L)  Hematocrit 36.0 - 46.0 % 19.7(L) 24.9(L) 33.2(L)  Platelets 150 - 400 K/uL 40(L) 54(L) 45(L)     Estimated Creatinine Clearance: 70.8 mL/min (by C-G formula based on SCr of 0.73 mg/dL).  Antibiotics Given (last 72 hours)     Date/Time Action Medication Dose Rate   04/05/21 2253 New Bag/Given   cefTRIAXone (ROCEPHIN) 2 g in sodium chloride 0.9 % 100 mL IVPB 2 g 200 mL/hr   04/06/21 0259 New Bag/Given   vancomycin (VANCOREADY) IVPB 1250 mg/250 mL 1,250 mg 166.7 mL/hr    04/06/21 1014 Given   rifaximin (XIFAXAN) tablet 550 mg 550 mg    04/06/21 1017 New Bag/Given   meropenem (MERREM) 1 g in sodium chloride 0.9 % 100 mL IVPB 1 g 200 mL/hr   04/06/21 1724 New Bag/Given   meropenem (MERREM) 1 g in sodium chloride 0.9 % 100 mL IVPB 1 g 200 mL/hr   04/06/21 2108 Given   rifaximin (XIFAXAN) tablet 550 mg 550 mg    04/07/21 0523 New Bag/Given   meropenem (MERREM) 1 g in sodium chloride 0.9 % 100 mL IVPB 1 g 200 mL/hr       Antimicrobials this admission: Meropenem 04/06/2021  >>  Ceftriaxone 1/16 x 1  Vancomycin 1/17 x 1 at 0115 restarted 1/18>>  Microbiology results: 04/06/2021  BCx: BCID staph species 04/06/2021  UCx: 50K CFU/ml GNR   04/06/2021  Resp Panel: negative   1/17 MRSA PCR is negative  Thank you for allowing pharmacy to be a part of this patients care.  Cristy Friedlander, PharmD Clinical Pharmacist

## 2021-04-07 NOTE — Progress Notes (Signed)
Inpatient Diabetes Program Recommendations  AACE/ADA: New Consensus Statement on Inpatient Glycemic Control (2015)  Target Ranges:  Prepandial:   less than 140 mg/dL      Peak postprandial:   less than 180 mg/dL (1-2 hours)      Critically ill patients:  140 - 180 mg/dL   Lab Results  Component Value Date   GLUCAP 273 (H) 04/07/2021   HGBA1C 5.8 (H) 03/18/2021    Review of Glycemic Control  Latest Reference Range & Units 04/06/21 11:08 04/06/21 15:30 04/06/21 17:15 04/06/21 21:12 04/07/21 07:57  Glucose-Capillary 70 - 99 mg/dL 017 (H) 510 (H) 258 (H) 298 (H) 273 (H)   Diabetes history:  DM 2 Outpatient Diabetes medications:  insulin pump with Novolog insulin, Dexcom G6 CGM Patient sees Dr. Art Buff with [facility] and was last seen on 12/21/20. Per office note on 12/21/20, Current pump basal settings should be: BASAL:  12A - 12P: 0.5 units/hr 12P-12A: 0.8 units/hr Total basal 15.6 units/day Current orders for Inpatient glycemic control: Novolog 0-9 units tid + hs  Glucerna tid between meals   Inpatient Diabetes Program Recommendations:   (Pt AMS should not be wearing pump if mentation is not clear) If pt does not have her pump on please consider adding: -Semglee 12 units qd -Novolog 3 units tid meal coverage if eats 50%  Thanks,  Christena Deem RN, MSN, BC-ADM Inpatient Diabetes Coordinator Team Pager [phone] (8a-5p)

## 2021-04-07 NOTE — Progress Notes (Signed)
She denies PROGRESS NOTE    [name]  BTD:[id number] DOB: 12-15-1965 DOA: 04/05/2021 PCP: [facility]    Brief Narrative:  56 year old female with a history of cirrhosis, admitted to the hospital with confusion and lethargy.  Found to have sepsis from possible UTI.  She was also found to have positive blood cultures and is on antibiotics.  Ammonia is noted to be elevated she is on lactulose and Xifaxan.  She is also noted to be anemic and has been receiving PRBC transfusions.  For hypotension, she has required Levophed infusion which has since been weaned off.   Assessment & Plan:   Principal Problem:   Sepsis secondary to UTI [facility]) Active Problems:   Gastroesophageal reflux   Thrombocytopenia (HCC)   Alcoholic cirrhosis (HCC)   Hypoalbuminemia due to protein-calorie malnutrition (HCC)   Generalized weakness   Gastro-esophageal reflux disease without esophagitis   Macrocytic anemia   Adult hypothyroidism   Supratherapeutic INR   Hyponatremia   Hyperglycemia due to diabetes mellitus (HCC)   Severe sepsis -Possibly related to possible UTI.  She does have a history of ESBL E. Coli -Currently on meropenem, will follow-up sensitivities -She had 2 sets of blood cultures drawn.  3 out of 4 bottles positive for staph species.  Await further ID and sensitivities -She briefly required Levophed infusion, but has since been weaned off. 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  She does not have any right upper quadrant pain or vomiting. -We will continue to monitor for now -Can consider restarting diuretics as hemodynamics stabilize  Hypothyroidism -Continue Synthroid  Insulin dependent diabetes mellitus -Followed by endocrinology at [facility] -She appears to be on an insulin pump as an outpatient. -Currently on sliding scale insulin -Based on p.o. intake, will consider adding basal insulin    DVT prophylaxis: SCDs Start: 04/06/21 0834  Code Status: DNR Family Communication: At patient's request, I updated her significant other over the phone Disposition Plan: Status is: Inpatient  Remains inpatient appropriate because: Continued IV antibiotics and management of encephalopathy.         Consultants:    Procedures:    Antimicrobials:  Meropenem 1/17 > Vancomycin 1/18 >   Subjective: Patient is somnolent, but does wake up to voice.  Has any shortness of breath.  She does report having bowel movements, but is unclear when her last one was.  Objective: Vitals:   04/07/21 1800 04/07/21 1807 04/07/21 1830 04/07/21 1855  BP: (!) 110/41 (!) 109/42 (!) 111/41 (!) 111/40  Pulse: 80 73 79 78  Resp: 17 13 16 15   Temp:  97.9 F (36.6 C)    TempSrc:  Oral    SpO2: 97% 92% 93% 93%  Weight:      Height:        Intake/Output Summary (Last 24 hours) at 04/07/2021 1902 Last data filed at 04/07/2021 1807 Gross per 24 hour  Intake 4440.52 ml  Output 2 ml  Net  4438.52 ml   Filed Weights   04/06/21 0833 04/07/21 0543  Weight: 62.3 kg 66 kg    Examination:  General exam: Appears calm and comfortable  Respiratory system: Clear to auscultation. Respiratory effort normal. Cardiovascular system: S1 & S2 heard, RRR. No JVD, murmurs, rubs, gallops or clicks. No pedal edema. Gastrointestinal system: Abdomen is nondistended, soft  and nontender. No organomegaly or masses felt. Normal bowel sounds heard. Central nervous system: Alert and oriented. No focal neurological deficits. Extremities: Symmetric 5 x 5 power. Skin: No rashes, lesions or ulcers Psychiatry: Judgement and insight appear normal. Mood & affect appropriate.     Data Reviewed: I have personally reviewed following labs and imaging studies  CBC: Recent Labs  Lab 04/05/21 2030 04/07/21 0450  WBC 10.4 6.2  NEUTROABS 8.9*  --   HGB 7.8* 6.5*  HCT 24.9* 19.7*  MCV 115.8* 107.7*  PLT 54* 40*   Basic Metabolic Panel: Recent Labs  Lab 04/05/21 2030 04/06/21 0855 04/07/21 0450  NA 132*  --  129*  K 4.9  --  3.9  CL 95*  --  97*  CO2 23  --  26  GLUCOSE 147*  --  283*  BUN 17  --  29*  CREATININE 1.00  --  0.73  CALCIUM 7.9*  --  7.8*  MG  --  1.4* 1.8  PHOS  --  4.2  --    GFR: Estimated Creatinine Clearance: 70.8 mL/min (by C-G formula based on SCr of 0.73 mg/dL). Liver Function Tests: Recent Labs  Lab 04/05/21 2030 04/07/21 0450  AST 82* 57*  ALT 25 27  ALKPHOS 97 67  BILITOT 20.7* 17.7*  PROT 5.3* 3.8*  ALBUMIN 1.8* <1.5*   Recent Labs  Lab 04/06/21 0727  LIPASE 59*   Recent Labs  Lab 04/05/21 2030 04/07/21 0450  AMMONIA 31 94*   Coagulation Profile: Recent Labs  Lab 04/05/21 2231  INR 3.4*   Cardiac Enzymes: No results for input(s): CKTOTAL, CKMB, CKMBINDEX, TROPONINI in the last 168 hours. BNP (last 3 results) No results for input(s): PROBNP in the last 8760 hours. HbA1C: No results for input(s): HGBA1C in the last 72 hours. CBG: Recent Labs  Lab 04/06/21 1715 04/06/21 2112 04/07/21 0757 04/07/21 1143 04/07/21 1614  GLUCAP 290* 298* 273* 242* 216*   Lipid Profile: No results for input(s): CHOL, HDL, LDLCALC, TRIG, CHOLHDL, LDLDIRECT in the last 72 hours. Thyroid Function Tests: No results for input(s): TSH, T4TOTAL, FREET4, T3FREE, THYROIDAB in the last 72 hours. Anemia Panel: Recent Labs     04/06/21 0725  VITAMINB12 1,442*  FOLATE 20.9   Sepsis Labs: Recent Labs  Lab 04/06/21 0030 04/06/21 0855 04/06/21 1040 04/07/21 0450  LATICACIDVEN 7.2* 6.0* 4.1* 2.8*    Recent Results (from the past 240 hour(s))  Urine Culture     Status: Abnormal (Preliminary result)   Collection Time: 04/05/21 12:36 AM   Specimen: Urine, Clean Catch  Result Value Ref Range Status   Specimen Description   Final    URINE, CLEAN CATCH Performed at [facility] [facility], [address] [place], [place] [address]    Special Requests   Final    NONE Performed at [facility], [address], [place], [place] [address]    Culture (A)  Final    50,000 COLONIES/mL ESCHERICHIA COLI SUSCEPTIBILITIES TO FOLLOW Performed at [facility], [place] [address], [place], [place] [address]    Report Status PENDING  Incomplete  Resp Panel by  RT-PCR (Flu A&B, Covid) Nasopharyngeal Swab     Status: None   Collection Time: 04/05/21 10:15 PM   Specimen: Nasopharyngeal Swab; Nasopharyngeal(NP) swabs in vial transport medium  Result Value Ref Range Status   SARS Coronavirus 2 by RT PCR NEGATIVE NEGATIVE Final    Comment: (NOTE) SARS-CoV-2 target nucleic acids are NOT DETECTED.  The SARS-CoV-2 RNA is generally detectable in upper respiratory specimens during the acute phase of infection. The lowest concentration of SARS-CoV-2 viral copies this assay can detect is 138 copies/mL. A negative result does not preclude SARS-Cov-2 infection and should not be used as the sole basis for treatment or other patient management decisions. A negative result may occur with  improper specimen collection/handling, submission of specimen other than nasopharyngeal swab, presence of viral mutation(s) within the areas targeted by this assay, and inadequate number of viral copies(<138 copies/mL). A negative result must be combined with clinical observations, patient history, and epidemiological information. The expected result is  Negative.  Fact Sheet for Patients:  EntrepreneurPulse.com.au  Fact Sheet for Healthcare Providers:  IncredibleEmployment.be  This test is no t yet approved or cleared by the [place] FDA and  has been authorized for detection and/or diagnosis of SARS-CoV-2 by FDA under an Emergency Use Authorization (EUA). This EUA will remain  in effect (meaning this test can be used) for the duration of the COVID-19 declaration under Section 564(b)(1) of the Act, 21 U.S.C.section 360bbb-3(b)(1), unless the authorization is terminated  or revoked sooner.       Influenza A by PCR NEGATIVE NEGATIVE Final   Influenza B by PCR NEGATIVE NEGATIVE Final    Comment: (NOTE) The Xpert Xpress SARS-CoV-2/FLU/RSV plus assay is intended as an aid in the diagnosis of influenza from Nasopharyngeal swab specimens and should not be used as a sole basis for treatment. Nasal washings and aspirates are unacceptable for Xpert Xpress SARS-CoV-2/FLU/RSV testing.  Fact Sheet for Patients: EntrepreneurPulse.com.au  Fact Sheet for Healthcare Providers: IncredibleEmployment.be  This test is not yet approved or cleared by the [place] FDA and has been authorized for detection and/or diagnosis of SARS-CoV-2 by FDA under an Emergency Use Authorization (EUA). This EUA will remain in effect (meaning this test can be used) for the duration of the COVID-19 declaration under Section 564(b)(1) of the Act, 21 U.S.C. section 360bbb-3(b)(1), unless the authorization is terminated or revoked.  Performed at [facility], [address] [place], [place] [address]   Blood Culture (routine x 2)     Status: Abnormal (Preliminary result)   Collection Time: 04/05/21 10:39 PM   Specimen: Left Antecubital; Blood  Result Value Ref Range Status   Specimen Description   Final    LEFT ANTECUBITAL Performed at [facility], [address] [place],  [place] [address]    Special Requests   Final    BOTTLES DRAWN AEROBIC AND ANAEROBIC Blood Culture adequate volume Performed at [facility], [address] [place], [place] [address]    Culture  Setup Time   Final    GRAM POSITIVE COCCI IN CLUSTERS IN BOTH AEROBIC AND ANAEROBIC BOTTLES CALL PREVIOUS TO ICU FOILEY, B. Organism ID to follow CRITICAL RESULT CALLED TO, READ BACK BY AND VERIFIED WITH: JESSICA HEARN RN 04/06/21 @2039  BY JW Performed at [facility], [place] [address], [place], [place] [address]    Culture STAPHYLOCOCCUS SIMULANS (A)  Final   Report Status PENDING  Incomplete  Blood Culture (routine x 2)     Status: None (Preliminary  result)   Collection Time: 04/05/21 10:39 PM   Specimen: BLOOD RIGHT ARM  Result Value Ref Range Status   Specimen Description   Final    BLOOD RIGHT ARM Performed at [facility], [address] [place], [place] [address]    Special Requests   Final    BOTTLES DRAWN AEROBIC ONLY Blood Culture adequate volume Performed at [facility], [address], [place], [place] [address]    Culture  Setup Time   Final    GRAM POSITIVE COCCI IN CLUSTERS AEROBIC BOTTLE ONLY  RESULTS CALLED AND READ BACK FROM FOILY, B AT 1400 ON 04/06/21 BY BROWNING, D.    Culture   Final    NO GROWTH 2 DAYS Performed at [facility], [address] [place], [place] [address]    Report Status PENDING  Incomplete  Blood Culture ID Panel (Reflexed)     Status: Abnormal   Collection Time: 04/05/21 10:39 PM  Result Value Ref Range Status   Enterococcus faecalis NOT DETECTED NOT DETECTED Final   Enterococcus Faecium NOT DETECTED NOT DETECTED Final   Listeria monocytogenes NOT DETECTED NOT DETECTED Final   Staphylococcus species DETECTED (A) NOT DETECTED Final    Comment: CRITICAL RESULT CALLED TO, READ BACK BY AND VERIFIED WITH: JESSICA HEARN RN 04/06/21 @2039  BY JW    Staphylococcus aureus (BCID) NOT DETECTED NOT DETECTED Final   Staphylococcus epidermidis NOT DETECTED  NOT DETECTED Final   Staphylococcus lugdunensis NOT DETECTED NOT DETECTED Final   Streptococcus species NOT DETECTED NOT DETECTED Final   Streptococcus agalactiae NOT DETECTED NOT DETECTED Final   Streptococcus pneumoniae NOT DETECTED NOT DETECTED Final   Streptococcus pyogenes NOT DETECTED NOT DETECTED Final   A.calcoaceticus-baumannii NOT DETECTED NOT DETECTED Final   Bacteroides fragilis NOT DETECTED NOT DETECTED Final   Enterobacterales NOT DETECTED NOT DETECTED Final   Enterobacter cloacae complex NOT DETECTED NOT DETECTED Final   Escherichia coli NOT DETECTED NOT DETECTED Final   Klebsiella aerogenes NOT DETECTED NOT DETECTED Final   Klebsiella oxytoca NOT DETECTED NOT DETECTED Final   Klebsiella pneumoniae NOT DETECTED NOT DETECTED Final   Proteus species NOT DETECTED NOT DETECTED Final   Salmonella species NOT DETECTED NOT DETECTED Final   Serratia marcescens NOT DETECTED NOT DETECTED Final   Haemophilus influenzae NOT DETECTED NOT DETECTED Final   Neisseria meningitidis NOT DETECTED NOT DETECTED Final   Pseudomonas aeruginosa NOT DETECTED NOT DETECTED Final   Stenotrophomonas maltophilia NOT DETECTED NOT DETECTED Final   Candida albicans NOT DETECTED NOT DETECTED Final   Candida auris NOT DETECTED NOT DETECTED Final   Candida glabrata NOT DETECTED NOT DETECTED Final   Candida krusei NOT DETECTED NOT DETECTED Final   Candida parapsilosis NOT DETECTED NOT DETECTED Final   Candida tropicalis NOT DETECTED NOT DETECTED Final   Cryptococcus neoformans/gattii NOT DETECTED NOT DETECTED Final    Comment: Performed at [facility], [address], [place], [place] [address]  MRSA Next Gen by PCR, Nasal     Status: None   Collection Time: 04/06/21  8:45 AM   Specimen: Nasal Mucosa; Nasal Swab  Result Value Ref Range Status   MRSA by PCR Next Gen NOT DETECTED NOT DETECTED Final    Comment: (NOTE) The GeneXpert MRSA Assay (FDA approved for NASAL specimens only), is one component  of a comprehensive MRSA colonization surveillance program. It is not intended to diagnose MRSA infection nor to guide or monitor treatment for MRSA infections. Test performance is not FDA  approved in patients less than 49 years old. Performed at [facility], [address] [place], [place] [address]          Radiology Studies: DG CHEST PORT 1 VIEW  Result Date: 04/06/2021 CLINICAL DATA:  PICC placement EXAM: PORTABLE CHEST 1 VIEW COMPARISON:  04/05/2021 FINDINGS: Interval placement of right upper extremity PICC, tip positioned near the superior cavoatrial junction. Otherwise unchanged AP portable examination with cardiomegaly and pulmonary vascular prominence as well as calcified bilateral pulmonary nodules. IMPRESSION: 1. Interval placement of right upper extremity PICC, tip positioned near the superior cavoatrial junction. 2. Otherwise unchanged AP portable examination with cardiomegaly and pulmonary vascular prominence. Electronically Signed   By: Delanna Ahmadi M.D.   On: 04/06/2021 13:17   DG Chest Port 1 View  Result Date: 04/05/2021 CLINICAL DATA:  Possible sepsis EXAM: PORTABLE CHEST 1 VIEW COMPARISON:  03/17/2021 FINDINGS: Cardiac shadow is enlarged but stable. Increasing vascular congestion is noted without significant edema. No focal infiltrate is seen. Mild left basilar atelectasis is noted. No bony abnormality is seen. IMPRESSION: Increasing congestive failure without definitive edema. Mild left basilar atelectasis. Electronically Signed   By: Inez Catalina M.D.   On: 04/05/2021 23:13   [place] EKG SITE RITE  Result Date: 04/06/2021 If Site Rite image not attached, placement could not be confirmed due to current cardiac rhythm.  US Abdomen Limited RUQ (LIVER/GB)  Result Date: 04/06/2021 CLINICAL DATA:  Cholangitis.  Prior cholecystectomy. EXAM: ULTRASOUND ABDOMEN LIMITED RIGHT UPPER QUADRANT COMPARISON:  07/29/2020 FINDINGS: Gallbladder: Surgically absent Common bile duct: Diameter:  1.1 cm Liver: Nodular contour compatible with cirrhosis. Perihepatic ascites. 1.5 cm peripheral cyst in the right hepatic lobe on image 23 series 1. The portal vein is patent where visualized, but with abnormal reversal of flow (hepatofugal) shown on image 38. Cavernous transformation of the portal vein noted. Other: None. IMPRESSION: 1. Hepatic cirrhosis observed with perihepatic ascites and abnormal hepatofugal flow in the portal vein which demonstrates cavernous transformation. 2. Surgically absent gallbladder. 3. Stable dilated common bile duct at 1.1 cm in diameter. Electronically Signed   By: Van Clines M.D.   On: 04/06/2021 10:17        Scheduled Meds:  sodium chloride   Intravenous Once   Chlorhexidine Gluconate Cloth  6 each Topical Daily   feeding supplement (GLUCERNA SHAKE)  237 mL Oral TID BM   insulin aspart  0-5 Units Subcutaneous QHS   insulin aspart  0-9 Units Subcutaneous TID WC   lactulose  30 g Oral TID   levothyroxine  50 mcg Oral QAC breakfast   lipase/protease/amylase  48,000 Units Oral TID AC   rifaximin  550 mg Oral BID   sodium chloride flush  10-40 mL Intracatheter Q12H   Continuous Infusions:  meropenem (MERREM) IV 1 g (04/07/21 1340)   norepinephrine (LEVOPHED) Adult infusion Stopped (04/07/21 0518)   vancomycin       LOS: 1 day    Time spent: 6mns    JKathie Dike MD [facility]   If 7PM-7AM, please contact night-coverage [email or web address]  04/07/2021, 7:02 PM

## 2021-04-07 NOTE — Progress Notes (Addendum)
Date and time results received: 04/07/21 0600 (use smartphrase ".now" to insert current time)  Test HGB Critical Value 6.5  Name of Provider Notified:Adefeso  Orders Received? Or Actions Taken?: lab redraw order placed

## 2021-04-07 NOTE — Progress Notes (Signed)
Patient BP 88/40 per monitor, 90/50 manual BP, MD notified. Will continue to monitor patient.

## 2021-04-08 LAB — TYPE AND SCREEN
ABO/RH(D): A POS
Antibody Screen: NEGATIVE
Unit division: 0
Unit division: 0

## 2021-04-08 LAB — URINE CULTURE: Culture: 50000 — AB

## 2021-04-08 LAB — BPAM RBC
Blood Product Expiration Date: 202301272359
Blood Product Expiration Date: 202302032359
ISSUE DATE / TIME: 202301170212
ISSUE DATE / TIME: 202301181500
Unit Type and Rh: 600
Unit Type and Rh: 6200

## 2021-04-08 LAB — CBC
HCT: 24.4 % — ABNORMAL LOW (ref 36.0–46.0)
Hemoglobin: 8.4 g/dL — ABNORMAL LOW (ref 12.0–15.0)
MCH: 34.1 pg — ABNORMAL HIGH (ref 26.0–34.0)
MCHC: 34.4 g/dL (ref 30.0–36.0)
MCV: 99.2 fL (ref 80.0–100.0)
Platelets: 40 10*3/uL — ABNORMAL LOW (ref 150–400)
RBC: 2.46 MIL/uL — ABNORMAL LOW (ref 3.87–5.11)
RDW: 21.9 % — ABNORMAL HIGH (ref 11.5–15.5)
WBC: 6.2 10*3/uL (ref 4.0–10.5)
nRBC: 0.5 % — ABNORMAL HIGH (ref 0.0–0.2)

## 2021-04-08 LAB — COMPREHENSIVE METABOLIC PANEL
ALT: 24 U/L (ref 0–44)
AST: 58 U/L — ABNORMAL HIGH (ref 15–41)
Albumin: 1.5 g/dL — ABNORMAL LOW (ref 3.5–5.0)
Alkaline Phosphatase: 82 U/L (ref 38–126)
Anion gap: 6 (ref 5–15)
BUN: 33 mg/dL — ABNORMAL HIGH (ref 6–20)
CO2: 28 mmol/L (ref 22–32)
Calcium: 8.4 mg/dL — ABNORMAL LOW (ref 8.9–10.3)
Chloride: 100 mmol/L (ref 98–111)
Creatinine, Ser: 0.67 mg/dL (ref 0.44–1.00)
GFR, Estimated: >60 mL/min (ref >60)
Glucose, Bld: 227 mg/dL — ABNORMAL HIGH (ref 70–99)
Potassium: 3.6 mmol/L (ref 3.5–5.1)
Sodium: 134 mmol/L — ABNORMAL LOW (ref 135–145)
Total Bilirubin: 16.5 mg/dL — ABNORMAL HIGH (ref 0.3–1.2)
Total Protein: 4.3 g/dL — ABNORMAL LOW (ref 6.5–8.1)

## 2021-04-08 LAB — CULTURE, BLOOD (ROUTINE X 2)
Special Requests: ADEQUATE
Special Requests: ADEQUATE

## 2021-04-08 LAB — GLUCOSE, CAPILLARY
Glucose-Capillary: 187 mg/dL — ABNORMAL HIGH (ref 70–99)
Glucose-Capillary: 428 mg/dL — ABNORMAL HIGH (ref 70–99)
Glucose-Capillary: 310 mg/dL — ABNORMAL HIGH (ref 70–99)
Glucose-Capillary: 246 mg/dL — ABNORMAL HIGH (ref 70–99)

## 2021-04-08 LAB — MAGNESIUM: Magnesium: 1.8 mg/dL (ref 1.7–2.4)

## 2021-04-08 LAB — AMMONIA: Ammonia: 46 umol/L — ABNORMAL HIGH (ref 9–35)

## 2021-04-08 LAB — PHOSPHORUS: Phosphorus: 2.3 mg/dL — ABNORMAL LOW (ref 2.5–4.6)

## 2021-04-08 MED ORDER — POTASSIUM CHLORIDE 10 MEQ/100ML IV SOLN
10.0000 meq | INTRAVENOUS | Status: AC
  Administered 2021-04-08 (×4): 10 meq via INTRAVENOUS
  Filled 2021-04-08 (×4): qty 100

## 2021-04-08 MED ORDER — INSULIN GLARGINE-YFGN 100 UNIT/ML ~~LOC~~ SOLN
10.0000 [IU] | Freq: Every day | SUBCUTANEOUS | Status: DC
  Administered 2021-04-08 – 2021-04-10 (×3): 10 [IU] via SUBCUTANEOUS
  Filled 2021-04-08 (×4): qty 0.1

## 2021-04-08 MED ORDER — FUROSEMIDE 10 MG/ML IJ SOLN: 20.0000 mg | Freq: Two times a day (BID) | INTRAMUSCULAR | Status: DC

## 2021-04-08 MED ORDER — FUROSEMIDE 10 MG/ML IJ SOLN
20.0000 mg | Freq: Two times a day (BID) | INTRAMUSCULAR | Status: AC
  Administered 2021-04-08 – 2021-04-10 (×4): 20 mg via INTRAVENOUS
  Filled 2021-04-08 (×4): qty 2

## 2021-04-08 MED ORDER — ALBUMIN HUMAN 25 % IV SOLN: 25.0000 g | Freq: Two times a day (BID) | INTRAVENOUS | Status: DC

## 2021-04-08 MED ORDER — SPIRONOLACTONE 25 MG PO TABS
25.0000 mg | ORAL_TABLET | Freq: Every day | ORAL | Status: DC
  Administered 2021-04-08 – 2021-04-13 (×6): 25 mg via ORAL
  Filled 2021-04-08 (×6): qty 1

## 2021-04-08 MED ORDER — INSULIN ASPART 100 UNIT/ML IJ SOLN
15.0000 [IU] | Freq: Once | INTRAMUSCULAR | Status: AC
  Administered 2021-04-08: 15 [IU] via SUBCUTANEOUS

## 2021-04-08 MED ORDER — ALBUMIN HUMAN 25 % IV SOLN
25.0000 g | Freq: Two times a day (BID) | INTRAVENOUS | Status: AC
  Administered 2021-04-08: 12.5 g via INTRAVENOUS
  Administered 2021-04-09 – 2021-04-10 (×3): 25 g via INTRAVENOUS
  Filled 2021-04-08 (×4): qty 100

## 2021-04-08 NOTE — Progress Notes (Addendum)
Patient noted to have a 3 beat run of V-tach then go in to about a 30 beat run of V-tach before going back in to sinus rhythm with heart rate in the 90's, blood pressure 152/81. Dr Kerry Hough made aware and patient RN T. Crite made aware. New orders placed per Dr Kerry Hough.

## 2021-04-08 NOTE — Progress Notes (Signed)
Inpatient Diabetes Program Recommendations  AACE/ADA: New Consensus Statement on Inpatient Glycemic Control (2015)  Target Ranges:  Prepandial:   less than 140 mg/dL      Peak postprandial:   less than 180 mg/dL (1-2 hours)      Critically ill patients:  140 - 180 mg/dL   Lab Results  Component Value Date   GLUCAP 187 (H) 04/08/2021   HGBA1C 5.8 (H) 03/18/2021    Review of Glycemic Control  Latest Reference Range & Units 04/07/21 07:57 04/07/21 11:43 04/07/21 16:14 04/07/21 20:42 04/08/21 07:53  Glucose-Capillary 70 - 99 mg/dL 244 (H) 010 (H) 272 (H) 272 (H) 187 (H)   Diabetes history:  DM 2 Outpatient Diabetes medications:  insulin pump with Novolog insulin, Dexcom G6 CGM Patient sees Dr. Art Buff with [facility] and was last seen on 12/21/20. Per office note on 12/21/20, Current pump basal settings should be: BASAL:  12A - 12P: 0.5 units/hr 12P-12A: 0.8 units/hr Total basal 15.6 units/day Current orders for Inpatient glycemic control: Novolog 0-9 units tid + hs  Glucerna tid between meals   Inpatient Diabetes Program Recommendations:   (Pt AMS should not be wearing pump if mentation is not clear) If pt does not have her pump on please consider adding: -Semglee 8-10 units qd -Novolog 3 units tid meal coverage if eats 50%  Thanks,  Christena Deem RN, MSN, BC-ADM Inpatient Diabetes Coordinator Team Pager [phone] (8a-5p)

## 2021-04-08 NOTE — Progress Notes (Addendum)
She denies PROGRESS NOTE    [name]  EKC:[id number] DOB: 31-Mar-1965 DOA: 04/05/2021 PCP: [facility]    Brief Narrative:  56 year old female with a history of cirrhosis, admitted to the hospital with confusion and lethargy.  Found to have sepsis from possible UTI.  She was also found to have positive blood cultures and is on antibiotics.  Ammonia is noted to be elevated she is on lactulose and Xifaxan.  She is also noted to be anemic and has been receiving PRBC transfusions.  For hypotension, she has required Levophed infusion which has since been weaned off.   Assessment & Plan:   Principal Problem:   Sepsis secondary to UTI [facility]) Active Problems:   Gastroesophageal reflux   Thrombocytopenia (HCC)   Alcoholic cirrhosis (HCC)   Hypoalbuminemia due to protein-calorie malnutrition (HCC)   Generalized weakness   Gastro-esophageal reflux disease without esophagitis   Macrocytic anemia   Adult hypothyroidism   Supratherapeutic INR   Hyponatremia   Hyperglycemia due to diabetes mellitus (HCC)   Severe sepsis -Possibly related to possible UTI.  She does have a history of ESBL E. Coli -Currently on meropenem, will follow-up sensitivities -She had 2 sets of blood cultures drawn.  3 out of 4 bottles positive for Staphylococcus simulans.  We will discuss further management with infectious disease -She briefly required Levophed infusion, but has since been weaned off. 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  She does not have any right upper quadrant pain or vomiting. -We will continue to monitor for now -Restarted on IV albumin and Lasix/Aldactone -We will consider paracentesis in a.m.  Hypothyroidism -Continue Synthroid  Insulin dependent diabetes mellitus -Followed by endocrinology at [facility] -She appears to be on an insulin pump as an outpatient. -Currently on sliding scale insulin -Add basal insulin since she is hyperglycemic  Chronic respiratory failure with hypoxia -Chronically on 2 to 3 L of supplemental oxygen    DVT prophylaxis: SCDs Start: 04/06/21 0834  Code Status: DNR Family Communication: At patient's request, I updated her significant other over the phone 1/18 Disposition Plan: Status is: Inpatient  Remains inpatient appropriate because: Continued IV antibiotics and management of encephalopathy.         Consultants:    Procedures:    Antimicrobials:  Meropenem 1/17 > Vancomycin 1/18 > 1/18   Subjective: Complains of some abdominal distention.  Feels that she is having edema in her lower extremities as well.  Reports that p.o. intake has been poor.  Objective: Vitals:   04/08/21 1600 04/08/21 1609 04/08/21 1700 04/08/21 1800  BP: (!) 143/48  (!) 142/56 (!) 137/50  Pulse: 87 90 96 93  Resp: 18 (!) 26 15 18   Temp:  98.8 F (37.1 C)    TempSrc:  Oral    SpO2: 95% 94% (!) 89% 93%  Weight:      Height:        Intake/Output Summary (Last 24 hours) at 04/08/2021 1921 Last data filed at 04/08/2021 5056 Gross  per 24 hour  Intake 778.08 ml  Output --  Net 778.08 ml   Filed Weights   04/06/21 0833 04/07/21 0543  Weight: 62.3 kg 66 kg    Examination: General exam: Alert, awake, oriented x 3 Respiratory system: Clear to auscultation. Respiratory effort normal. Cardiovascular system:RRR. No murmurs, rubs,  gallops. Gastrointestinal system: Abdomen is distended, soft and nontender. No organomegaly or masses felt. Normal bowel sounds heard. Central nervous system: Alert and oriented. No focal neurological deficits. Extremities: No C/C/E, +pedal pulses Skin: No rashes, lesions or ulcers Psychiatry: Judgement and insight appear normal. Mood & affect appropriate.      Data Reviewed: I have personally reviewed following labs and imaging studies  CBC: Recent Labs  Lab 04/05/21 2030 04/07/21 0450 04/08/21 0443  WBC 10.4 6.2 6.2  NEUTROABS 8.9*  --   --   HGB 7.8* 6.5* 8.4*  HCT 24.9* 19.7* 24.4*  MCV 115.8* 107.7* 99.2  PLT 54* 40* 40*   Basic Metabolic Panel: Recent Labs  Lab 04/05/21 2030 04/06/21 0855 04/07/21 0450 04/08/21 0443  NA 132*  --  129* 134*  K 4.9  --  3.9 3.6  CL 95*  --  97* 100  CO2 23  --  26 28  GLUCOSE 147*  --  283* 227*  BUN 17  --  29* 33*  CREATININE 1.00  --  0.73 0.67  CALCIUM 7.9*  --  7.8* 8.4*  MG  --  1.4* 1.8 1.8  PHOS  --  4.2  --  2.3*   GFR: Estimated Creatinine Clearance: 70.8 mL/min (by C-G formula based on SCr of 0.67 mg/dL). Liver Function Tests: Recent Labs  Lab 04/05/21 2030 04/07/21 0450 04/08/21 0443  AST 82* 57* 58*  ALT 25 27 24   ALKPHOS 97 67 82  BILITOT 20.7* 17.7* 16.5*  PROT 5.3* 3.8* 4.3*  ALBUMIN 1.8* <1.5* 1.5*   Recent Labs  Lab 04/06/21 0727  LIPASE 59*   Recent Labs  Lab 04/05/21 2030 04/07/21 0450 04/08/21 0443  AMMONIA 31 94* 46*   Coagulation Profile: Recent Labs  Lab 04/05/21 2231  INR 3.4*   Cardiac Enzymes: No results for input(s): CKTOTAL, CKMB, CKMBINDEX, TROPONINI in the last 168 hours. BNP (last 3 results) No results for input(s): PROBNP in the last 8760 hours. HbA1C: No results for input(s): HGBA1C in the last 72 hours. CBG: Recent Labs  Lab 04/07/21 1614 04/07/21 2042 04/08/21 0753 04/08/21 1611 04/08/21 1822  GLUCAP 216* 272* 187* 428* 310*   Lipid Profile: No results  for input(s): CHOL, HDL, LDLCALC, TRIG, CHOLHDL, LDLDIRECT in the last 72 hours. Thyroid Function Tests: No results for input(s): TSH, T4TOTAL, FREET4, T3FREE, THYROIDAB in the last 72 hours. Anemia Panel: Recent Labs    04/06/21 0725  VITAMINB12 1,442*  FOLATE 20.9   Sepsis Labs: Recent Labs  Lab 04/06/21 0030 04/06/21 0855 04/06/21 1040 04/07/21 0450  LATICACIDVEN 7.2* 6.0* 4.1* 2.8*    Recent Results (from the past 240 hour(s))  Urine Culture     Status: Abnormal   Collection Time: 04/05/21 12:36 AM   Specimen: Urine, Clean Catch  Result Value Ref Range Status   Specimen Description   Final    URINE, CLEAN CATCH Performed at [facility], [address] [place], [place] [address]    Special Requests   Final    NONE Performed at [facility], [address] [place], [place] [address]    Culture (A)  Final    50,000  COLONIES/mL ESCHERICHIA COLI Confirmed Extended Spectrum Beta-Lactamase Producer (ESBL).  In bloodstream infections from ESBL organisms, carbapenems are preferred over piperacillin/tazobactam. They are shown to have a lower risk of mortality.    Report Status 04/08/2021 FINAL  Final   Organism ID, Bacteria ESCHERICHIA COLI (A)  Final      Susceptibility   Escherichia coli - MIC*    AMPICILLIN >=32 RESISTANT Resistant     CEFAZOLIN >=64 RESISTANT Resistant     CEFEPIME 16 RESISTANT Resistant     CEFTRIAXONE >=64 RESISTANT Resistant     CIPROFLOXACIN >=4 RESISTANT Resistant     GENTAMICIN <=1 SENSITIVE Sensitive     IMIPENEM <=0.25 SENSITIVE Sensitive     NITROFURANTOIN <=16 SENSITIVE Sensitive     TRIMETH/SULFA <=20 SENSITIVE Sensitive     AMPICILLIN/SULBACTAM >=32 RESISTANT Resistant     PIP/TAZO 32 INTERMEDIATE Intermediate     * 50,000 COLONIES/mL ESCHERICHIA COLI  Resp Panel by RT-PCR (Flu A&B, Covid) Nasopharyngeal Swab     Status: None   Collection Time: 04/05/21 10:15 PM   Specimen: Nasopharyngeal Swab; Nasopharyngeal(NP) swabs in vial  transport medium  Result Value Ref Range Status   SARS Coronavirus 2 by RT PCR NEGATIVE NEGATIVE Final    Comment: (NOTE) SARS-CoV-2 target nucleic acids are NOT DETECTED.  The SARS-CoV-2 RNA is generally detectable in upper respiratory specimens during the acute phase of infection. The lowest concentration of SARS-CoV-2 viral copies this assay can detect is 138 copies/mL. A negative result does not preclude SARS-Cov-2 infection and should not be used as the sole basis for treatment or other patient management decisions. A negative result may occur with  improper specimen collection/handling, submission of specimen other than nasopharyngeal swab, presence of viral mutation(s) within the areas targeted by this assay, and inadequate number of viral copies(<138 copies/mL). A negative result must be combined with clinical observations, patient history, and epidemiological information. The expected result is Negative.  Fact Sheet for Patients:  EntrepreneurPulse.com.au  Fact Sheet for Healthcare Providers:  IncredibleEmployment.be  This test is no t yet approved or cleared by the [place] FDA and  has been authorized for detection and/or diagnosis of SARS-CoV-2 by FDA under an Emergency Use Authorization (EUA). This EUA will remain  in effect (meaning this test can be used) for the duration of the COVID-19 declaration under Section 564(b)(1) of the Act, 21 U.S.C.section 360bbb-3(b)(1), unless the authorization is terminated  or revoked sooner.       Influenza A by PCR NEGATIVE NEGATIVE Final   Influenza B by PCR NEGATIVE NEGATIVE Final    Comment: (NOTE) The Xpert Xpress SARS-CoV-2/FLU/RSV plus assay is intended as an aid in the diagnosis of influenza from Nasopharyngeal swab specimens and should not be used as a sole basis for treatment. Nasal washings and aspirates are unacceptable for Xpert Xpress SARS-CoV-2/FLU/RSV testing.  Fact  Sheet for Patients: EntrepreneurPulse.com.au  Fact Sheet for Healthcare Providers: IncredibleEmployment.be  This test is not yet approved or cleared by the [place] FDA and has been authorized for detection and/or diagnosis of SARS-CoV-2 by FDA under an Emergency Use Authorization (EUA). This EUA will remain in effect (meaning this test can be used) for the duration of the COVID-19 declaration under Section 564(b)(1) of the Act, 21 U.S.C. section 360bbb-3(b)(1), unless the authorization is terminated or revoked.  Performed at [facility], [address] [place], [place] [address]   Blood Culture (routine x 2)     Status: Abnormal   Collection Time: 04/05/21 10:39  PM   Specimen: Left Antecubital; Blood  Result Value Ref Range Status   Specimen Description   Final    LEFT ANTECUBITAL Performed at [facility], [address] [place], [place] [address]    Special Requests   Final    BOTTLES DRAWN AEROBIC AND ANAEROBIC Blood Culture adequate volume Performed at [facility], [address] [place], [place] [address]    Culture  Setup Time   Final    GRAM POSITIVE COCCI IN CLUSTERS IN BOTH AEROBIC AND ANAEROBIC BOTTLES CALL PREVIOUS TO ICU FOILEY, B. Organism ID to follow CRITICAL RESULT CALLED TO, READ BACK BY AND VERIFIED WITH: JESSICA HEARN RN 04/06/21 @2039  BY JW Performed at [facility], [place] [address] [place], [place] [address]    Culture STAPHYLOCOCCUS SIMULANS (A)  Final   Report Status 04/08/2021 FINAL  Final   Organism ID, Bacteria STAPHYLOCOCCUS SIMULANS  Final      Susceptibility   Staphylococcus simulans - MIC*    CIPROFLOXACIN <=0.5 SENSITIVE Sensitive     ERYTHROMYCIN >=8 RESISTANT Resistant     GENTAMICIN <=0.5 SENSITIVE Sensitive     OXACILLIN <=0.25 SENSITIVE Sensitive     TETRACYCLINE <=1 SENSITIVE Sensitive     VANCOMYCIN <=0.5 SENSITIVE Sensitive     TRIMETH/SULFA <=10 SENSITIVE Sensitive     CLINDAMYCIN  >=8 RESISTANT Resistant     RIFAMPIN 16 RESISTANT Resistant     Inducible Clindamycin NEGATIVE Sensitive     * STAPHYLOCOCCUS SIMULANS  Blood Culture (routine x 2)     Status: Abnormal   Collection Time: 04/05/21 10:39 PM   Specimen: BLOOD RIGHT ARM  Result Value Ref Range Status   Specimen Description   Final    BLOOD RIGHT ARM Performed at [facility], [address], [place], [place] [address]    Special Requests   Final    BOTTLES DRAWN AEROBIC ONLY Blood Culture adequate volume Performed at [facility], [address], [place], [place] [address]    Culture  Setup Time   Final    GRAM POSITIVE COCCI IN CLUSTERS AEROBIC BOTTLE ONLY  RESULTS CALLED AND READ BACK FROM FOILY, B AT 1400 ON 04/06/21 BY BROWNING, D.    Culture (A)  Final    STAPHYLOCOCCUS SIMULANS SUSCEPTIBILITIES PERFORMED ON PREVIOUS CULTURE WITHIN THE LAST 5 DAYS. Performed at [facility], [place] [address], [place], [place] [address]    Report Status 04/08/2021 FINAL  Final  Blood Culture ID Panel (Reflexed)     Status: Abnormal   Collection Time: 04/05/21 10:39 PM  Result Value Ref Range Status   Enterococcus faecalis NOT DETECTED NOT DETECTED Final   Enterococcus Faecium NOT DETECTED NOT DETECTED Final   Listeria monocytogenes NOT DETECTED NOT DETECTED Final   Staphylococcus species DETECTED (A) NOT DETECTED Final    Comment: CRITICAL RESULT CALLED TO, READ BACK BY AND VERIFIED WITH: JESSICA HEARN RN 04/06/21 @2039  BY JW    Staphylococcus aureus (BCID) NOT DETECTED NOT DETECTED Final   Staphylococcus epidermidis NOT DETECTED NOT DETECTED Final   Staphylococcus lugdunensis NOT DETECTED NOT DETECTED Final   Streptococcus species NOT DETECTED NOT DETECTED Final   Streptococcus agalactiae NOT DETECTED NOT DETECTED Final   Streptococcus pneumoniae NOT DETECTED NOT DETECTED Final   Streptococcus pyogenes NOT DETECTED NOT DETECTED Final   A.calcoaceticus-baumannii NOT DETECTED NOT DETECTED Final    Bacteroides fragilis NOT DETECTED NOT DETECTED Final   Enterobacterales NOT DETECTED NOT DETECTED Final   Enterobacter cloacae complex  NOT DETECTED NOT DETECTED Final   Escherichia coli NOT DETECTED NOT DETECTED Final   Klebsiella aerogenes NOT DETECTED NOT DETECTED Final   Klebsiella oxytoca NOT DETECTED NOT DETECTED Final   Klebsiella pneumoniae NOT DETECTED NOT DETECTED Final   Proteus species NOT DETECTED NOT DETECTED Final   Salmonella species NOT DETECTED NOT DETECTED Final   Serratia marcescens NOT DETECTED NOT DETECTED Final   Haemophilus influenzae NOT DETECTED NOT DETECTED Final   Neisseria meningitidis NOT DETECTED NOT DETECTED Final   Pseudomonas aeruginosa NOT DETECTED NOT DETECTED Final   Stenotrophomonas maltophilia NOT DETECTED NOT DETECTED Final   Candida albicans NOT DETECTED NOT DETECTED Final   Candida auris NOT DETECTED NOT DETECTED Final   Candida glabrata NOT DETECTED NOT DETECTED Final   Candida krusei NOT DETECTED NOT DETECTED Final   Candida parapsilosis NOT DETECTED NOT DETECTED Final   Candida tropicalis NOT DETECTED NOT DETECTED Final   Cryptococcus neoformans/gattii NOT DETECTED NOT DETECTED Final    Comment: Performed at [facility], [place] [address] [place], [place] [address]  MRSA Next Gen by PCR, Nasal     Status: None   Collection Time: 04/06/21  8:45 AM   Specimen: Nasal Mucosa; Nasal Swab  Result Value Ref Range Status   MRSA by PCR Next Gen NOT DETECTED NOT DETECTED Final    Comment: (NOTE) The GeneXpert MRSA Assay (FDA approved for NASAL specimens only), is one component of a comprehensive MRSA colonization surveillance program. It is not intended to diagnose MRSA infection nor to guide or monitor treatment for MRSA infections. Test performance is not FDA approved in patients less than 69 years old. Performed at [facility], [address], [place], [place] [address]          Radiology Studies: No results  found.      Scheduled Meds:  Chlorhexidine Gluconate Cloth  6 each Topical Daily   feeding supplement (GLUCERNA SHAKE)  237 mL Oral TID BM   furosemide  20 mg Intravenous Q12H   insulin aspart  0-5 Units Subcutaneous QHS   insulin aspart  0-9 Units Subcutaneous TID WC   insulin glargine-yfgn  10 Units Subcutaneous QHS   lactulose  30 g Oral TID   levothyroxine  50 mcg Oral QAC breakfast   lipase/protease/amylase  48,000 Units Oral TID AC   rifaximin  550 mg Oral BID   sodium chloride flush  10-40 mL Intracatheter Q12H   spironolactone  25 mg Oral Daily   Continuous Infusions:  albumin human 12.5 g (04/08/21 1824)   meropenem (MERREM) IV 1 g (04/08/21 1326)   norepinephrine (LEVOPHED) Adult infusion Stopped (04/07/21 0518)   potassium chloride 10 mEq (04/08/21 1853)     LOS: 2 days    Time spent: 80mns    JKathie Dike MD [facility]   If 7PM-7AM, please contact night-coverage [email or web address]  04/08/2021, 7:21 PM

## 2021-04-09 ENCOUNTER — Inpatient Hospital Stay (HOSPITAL_COMMUNITY): Payer: Medicare Other

## 2021-04-09 LAB — COMPREHENSIVE METABOLIC PANEL
ALT: 21 U/L (ref 0–44)
AST: 48 U/L — ABNORMAL HIGH (ref 15–41)
Albumin: 1.8 g/dL — ABNORMAL LOW (ref 3.5–5.0)
Alkaline Phosphatase: 73 U/L (ref 38–126)
Anion gap: 5 (ref 5–15)
BUN: 26 mg/dL — ABNORMAL HIGH (ref 6–20)
CO2: 28 mmol/L (ref 22–32)
Calcium: 8.6 mg/dL — ABNORMAL LOW (ref 8.9–10.3)
Chloride: 100 mmol/L (ref 98–111)
Creatinine, Ser: 0.38 mg/dL — ABNORMAL LOW (ref 0.44–1.00)
GFR, Estimated: >60 mL/min (ref >60)
Glucose, Bld: 306 mg/dL — ABNORMAL HIGH (ref 70–99)
Potassium: 3.7 mmol/L (ref 3.5–5.1)
Sodium: 133 mmol/L — ABNORMAL LOW (ref 135–145)
Total Bilirubin: 14.1 mg/dL — ABNORMAL HIGH (ref 0.3–1.2)
Total Protein: 4.3 g/dL — ABNORMAL LOW (ref 6.5–8.1)

## 2021-04-09 LAB — GLUCOSE, CAPILLARY
Glucose-Capillary: 248 mg/dL — ABNORMAL HIGH (ref 70–99)
Glucose-Capillary: 243 mg/dL — ABNORMAL HIGH (ref 70–99)
Glucose-Capillary: 249 mg/dL — ABNORMAL HIGH (ref 70–99)
Glucose-Capillary: 249 mg/dL — ABNORMAL HIGH (ref 70–99)

## 2021-04-09 LAB — CBC
HCT: 21.9 % — ABNORMAL LOW (ref 36.0–46.0)
Hemoglobin: 7.2 g/dL — ABNORMAL LOW (ref 12.0–15.0)
MCH: 32.7 pg (ref 26.0–34.0)
MCHC: 32.9 g/dL (ref 30.0–36.0)
MCV: 99.5 fL (ref 80.0–100.0)
Platelets: 34 10*3/uL — ABNORMAL LOW (ref 150–400)
RBC: 2.2 MIL/uL — ABNORMAL LOW (ref 3.87–5.11)
RDW: 21.4 % — ABNORMAL HIGH (ref 11.5–15.5)
WBC: 4.2 10*3/uL (ref 4.0–10.5)
nRBC: 0.7 % — ABNORMAL HIGH (ref 0.0–0.2)

## 2021-04-09 MED ORDER — INSULIN ASPART 100 UNIT/ML IJ SOLN
4.0000 [IU] | Freq: Three times a day (TID) | INTRAMUSCULAR | Status: DC
  Administered 2021-04-09 – 2021-04-15 (×18): 4 [IU] via SUBCUTANEOUS

## 2021-04-09 NOTE — Progress Notes (Signed)
She denies PROGRESS NOTE    [name]  IWL:[id number] DOB: Jan 16, 1966 DOA: 04/05/2021 PCP: [facility]    Brief Narrative:  56 year old female with a history of cirrhosis, admitted to the hospital with confusion and lethargy.  Found to have sepsis from possible UTI.  She was also found to have positive blood cultures and is on antibiotics.  Ammonia is noted to be elevated she is on lactulose and Xifaxan.  She is also noted to be anemic and has been receiving PRBC transfusions.  For hypotension, she has required Levophed infusion which has since been weaned off.   Assessment & Plan:   Principal Problem:   Sepsis secondary to UTI [facility]) Active Problems:   Gastroesophageal reflux   Thrombocytopenia (HCC)   Alcoholic cirrhosis (HCC)   Hypoalbuminemia due to protein-calorie malnutrition (HCC)   Generalized weakness   Gastro-esophageal reflux disease without esophagitis   Macrocytic anemia   Adult hypothyroidism   Supratherapeutic INR   Hyponatremia   Hyperglycemia due to diabetes mellitus (HCC)   Severe sepsis -Possibly related to possible UTI.  She does have a history of ESBL E. Coli -Currently on meropenem, will follow-up sensitivities -She had 2 sets of blood cultures drawn.  3 out of 4 bottles positive for Staphylococcus simulans.  We will discuss further management with infectious disease -She briefly required Levophed infusion, but has since been weaned off. 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  She does not have any right upper quadrant pain or vomiting. -We will continue to monitor for now -Restarted on IV albumin and Lasix/Aldactone -Ultrasound abdomen did not show a significant amount of ascites for therapeutic paracentesis  Hypothyroidism -Continue Synthroid  Insulin dependent diabetes mellitus -Followed by endocrinology at [facility] -She appears to be on an insulin pump as an outpatient. -Currently on sliding scale insulin -Currently on basal insulin -We will add NovoLog for meal coverage  Chronic respiratory failure with hypoxia -Chronically on 2 to 3 L of supplemental oxygen    DVT prophylaxis: SCDs Start: 04/06/21 0834  Code Status: DNR Family Communication: At patient's request, I updated her significant other over the phone 1/18 Disposition Plan: Status is: Inpatient  Remains inpatient appropriate because: Continued IV antibiotics and management of encephalopathy.         Consultants:    Procedures:    Antimicrobials:  Meropenem 1/17 > Vancomycin 1/18 > 1/18   Subjective: Continues to complain of significant swelling.  Feels abdomen is distended.  She is having bowel movements.  Objective: Vitals:   04/09/21 1545 04/09/21 1600 04/09/21 1655 04/09/21 1713  BP: (!) 102/36 (!) 114/36  (!) 128/55  Pulse: 73 81  79  Resp: 20 19  18   Temp:   98 F (36.7 C) 98.3 F (36.8 C)  TempSrc:   Axillary Oral  SpO2: 95% 95%  94%  Weight:      Height:        Intake/Output Summary (Last 24 hours) at 04/09/2021 1846  Last data filed at 04/09/2021 1835 Gross per 24 hour  Intake 1210.79 ml  Output --  Net 1210.79 ml   Filed Weights   04/06/21 0833 04/07/21 0543  Weight: 62.3 kg 66 kg    Examination: General exam: Alert, awake, oriented x 3 Respiratory system: Clear to auscultation.  Respiratory effort normal. Cardiovascular system:RRR. No murmurs, rubs, gallops. Gastrointestinal system: Abdomen is distended, soft and nontender. No organomegaly or masses felt. Normal bowel sounds heard. Central nervous system: Alert and oriented. No focal neurological deficits. Extremities: No C/C/E, +pedal pulses Skin: No rashes, lesions or ulcers Psychiatry: Judgement and insight appear normal. Mood & affect appropriate.      Data Reviewed: I have personally reviewed following labs and imaging studies  CBC: Recent Labs  Lab 04/05/21 2030 04/07/21 0450 04/08/21 0443 04/09/21 0352  WBC 10.4 6.2 6.2 4.2  NEUTROABS 8.9*  --   --   --   HGB 7.8* 6.5* 8.4* 7.2*  HCT 24.9* 19.7* 24.4* 21.9*  MCV 115.8* 107.7* 99.2 99.5  PLT 54* 40* 40* 34*   Basic Metabolic Panel: Recent Labs  Lab 04/05/21 2030 04/06/21 0855 04/07/21 0450 04/08/21 0443 04/09/21 0352  NA 132*  --  129* 134* 133*  K 4.9  --  3.9 3.6 3.7  CL 95*  --  97* 100 100  CO2 23  --  26 28 28   GLUCOSE 147*  --  283* 227* 306*  BUN 17  --  29* 33* 26*  CREATININE 1.00  --  0.73 0.67 0.38*  CALCIUM 7.9*  --  7.8* 8.4* 8.6*  MG  --  1.4* 1.8 1.8  --   PHOS  --  4.2  --  2.3*  --    GFR: Estimated Creatinine Clearance: 70.8 mL/min (A) (by C-G formula based on SCr of 0.38 mg/dL (L)). Liver Function Tests: Recent Labs  Lab 04/05/21 2030 04/07/21 0450 04/08/21 0443 04/09/21 0352  AST 82* 57* 58* 48*  ALT 25 27 24 21   ALKPHOS 97 67 82 73  BILITOT 20.7* 17.7* 16.5* 14.1*  PROT 5.3* 3.8* 4.3* 4.3*  ALBUMIN 1.8* <1.5* 1.5* 1.8*   Recent Labs  Lab 04/06/21 0727  LIPASE 59*   Recent Labs  Lab 04/05/21 2030 04/07/21 0450 04/08/21 0443  AMMONIA 31 94* 46*   Coagulation Profile: Recent Labs  Lab 04/05/21 2231  INR 3.4*   Cardiac Enzymes: No results for input(s): CKTOTAL, CKMB, CKMBINDEX, TROPONINI in the last 168 hours. BNP (last 3 results) No results for input(s): PROBNP in the last 8760  hours. HbA1C: No results for input(s): HGBA1C in the last 72 hours. CBG: Recent Labs  Lab 04/08/21 1822 04/08/21 2234 04/09/21 0731 04/09/21 1117 04/09/21 1639  GLUCAP 310* 246* 248* 243* 249*   Lipid Profile: No results for input(s): CHOL, HDL, LDLCALC, TRIG, CHOLHDL, LDLDIRECT in the last 72 hours. Thyroid Function Tests: No results for input(s): TSH, T4TOTAL, FREET4, T3FREE, THYROIDAB in the last 72 hours. Anemia Panel: No results for input(s): VITAMINB12, FOLATE, FERRITIN, TIBC, IRON, RETICCTPCT in the last 72 hours.  Sepsis Labs: Recent Labs  Lab 04/06/21 0030 04/06/21 0855 04/06/21 1040 04/07/21 0450  LATICACIDVEN 7.2* 6.0* 4.1* 2.8*    Recent Results (from the past 240 hour(s))  Urine Culture     Status: Abnormal   Collection Time: 04/05/21 12:36 AM   Specimen: Urine, Clean Catch  Result Value Ref Range Status   Specimen Description   Final    URINE, CLEAN CATCH Performed  at [facility], [address] [place], [place] [address]    Special Requests   Final    NONE Performed at [facility], [address] [place], [place] [address]    Culture (A)  Final    50,000 COLONIES/mL ESCHERICHIA COLI Confirmed Extended Spectrum Beta-Lactamase Producer (ESBL).  In bloodstream infections from ESBL organisms, carbapenems are preferred over piperacillin/tazobactam. They are shown to have a lower risk of mortality.    Report Status 04/08/2021 FINAL  Final   Organism ID, Bacteria ESCHERICHIA COLI (A)  Final      Susceptibility   Escherichia coli - MIC*    AMPICILLIN >=32 RESISTANT Resistant     CEFAZOLIN >=64 RESISTANT Resistant     CEFEPIME 16 RESISTANT Resistant     CEFTRIAXONE >=64 RESISTANT Resistant     CIPROFLOXACIN >=4 RESISTANT Resistant     GENTAMICIN <=1 SENSITIVE Sensitive     IMIPENEM <=0.25 SENSITIVE Sensitive     NITROFURANTOIN <=16 SENSITIVE Sensitive     TRIMETH/SULFA <=20 SENSITIVE Sensitive     AMPICILLIN/SULBACTAM >=32 RESISTANT Resistant      PIP/TAZO 32 INTERMEDIATE Intermediate     * 50,000 COLONIES/mL ESCHERICHIA COLI  Resp Panel by RT-PCR (Flu A&B, Covid) Nasopharyngeal Swab     Status: None   Collection Time: 04/05/21 10:15 PM   Specimen: Nasopharyngeal Swab; Nasopharyngeal(NP) swabs in vial transport medium  Result Value Ref Range Status   SARS Coronavirus 2 by RT PCR NEGATIVE NEGATIVE Final    Comment: (NOTE) SARS-CoV-2 target nucleic acids are NOT DETECTED.  The SARS-CoV-2 RNA is generally detectable in upper respiratory specimens during the acute phase of infection. The lowest concentration of SARS-CoV-2 viral copies this assay can detect is 138 copies/mL. A negative result does not preclude SARS-Cov-2 infection and should not be used as the sole basis for treatment or other patient management decisions. A negative result may occur with  improper specimen collection/handling, submission of specimen other than nasopharyngeal swab, presence of viral mutation(s) within the areas targeted by this assay, and inadequate number of viral copies(<138 copies/mL). A negative result must be combined with clinical observations, patient history, and epidemiological information. The expected result is Negative.  Fact Sheet for Patients:  EntrepreneurPulse.com.au  Fact Sheet for Healthcare Providers:  IncredibleEmployment.be  This test is no t yet approved or cleared by the [place] FDA and  has been authorized for detection and/or diagnosis of SARS-CoV-2 by FDA under an Emergency Use Authorization (EUA). This EUA will remain  in effect (meaning this test can be used) for the duration of the COVID-19 declaration under Section 564(b)(1) of the Act, 21 U.S.C.section 360bbb-3(b)(1), unless the authorization is terminated  or revoked sooner.       Influenza A by PCR NEGATIVE NEGATIVE Final   Influenza B by PCR NEGATIVE NEGATIVE Final    Comment: (NOTE) The Xpert Xpress  SARS-CoV-2/FLU/RSV plus assay is intended as an aid in the diagnosis of influenza from Nasopharyngeal swab specimens and should not be used as a sole basis for treatment. Nasal washings and aspirates are unacceptable for Xpert Xpress SARS-CoV-2/FLU/RSV testing.  Fact Sheet for Patients: EntrepreneurPulse.com.au  Fact Sheet for Healthcare Providers: IncredibleEmployment.be  This test is not yet approved or cleared by the [place] FDA and has been authorized for detection and/or diagnosis of SARS-CoV-2 by FDA under an Emergency Use Authorization (EUA). This EUA will remain in effect (meaning this test can be used) for the duration of the COVID-19 declaration under Section 564(b)(1) of  the Act, 21 U.S.C. section 360bbb-3(b)(1), unless the authorization is terminated or revoked.  Performed at [facility], [address], [place], [place] [address]   Blood Culture (routine x 2)     Status: Abnormal   Collection Time: 04/05/21 10:39 PM   Specimen: Left Antecubital; Blood  Result Value Ref Range Status   Specimen Description   Final    LEFT ANTECUBITAL Performed at [facility], [address] [place], [place] [address]    Special Requests   Final    BOTTLES DRAWN AEROBIC AND ANAEROBIC Blood Culture adequate volume Performed at [facility], [address], [place], [place] [address]    Culture  Setup Time   Final    GRAM POSITIVE COCCI IN CLUSTERS IN BOTH AEROBIC AND ANAEROBIC BOTTLES CALL PREVIOUS TO ICU FOILEY, B. Organism ID to follow CRITICAL RESULT CALLED TO, READ BACK BY AND VERIFIED WITH: JESSICA HEARN RN 04/06/21 @2039  BY JW Performed at [facility], [place] [address], [place], [place] [address]    Culture STAPHYLOCOCCUS SIMULANS (A)  Final   Report Status 04/08/2021 FINAL  Final   Organism ID, Bacteria STAPHYLOCOCCUS SIMULANS  Final      Susceptibility   Staphylococcus simulans - MIC*    CIPROFLOXACIN <=0.5 SENSITIVE Sensitive      ERYTHROMYCIN >=8 RESISTANT Resistant     GENTAMICIN <=0.5 SENSITIVE Sensitive     OXACILLIN <=0.25 SENSITIVE Sensitive     TETRACYCLINE <=1 SENSITIVE Sensitive     VANCOMYCIN <=0.5 SENSITIVE Sensitive     TRIMETH/SULFA <=10 SENSITIVE Sensitive     CLINDAMYCIN >=8 RESISTANT Resistant     RIFAMPIN 16 RESISTANT Resistant     Inducible Clindamycin NEGATIVE Sensitive     * STAPHYLOCOCCUS SIMULANS  Blood Culture (routine x 2)     Status: Abnormal   Collection Time: 04/05/21 10:39 PM   Specimen: BLOOD RIGHT ARM  Result Value Ref Range Status   Specimen Description   Final    BLOOD RIGHT ARM Performed at [facility], [address] [place], [place] [address]    Special Requests   Final    BOTTLES DRAWN AEROBIC ONLY Blood Culture adequate volume Performed at [facility], [address], [place], [place] [address]    Culture  Setup Time   Final    GRAM POSITIVE COCCI IN CLUSTERS AEROBIC BOTTLE ONLY  RESULTS CALLED AND READ BACK FROM FOILY, B AT 1400 ON 04/06/21 BY BROWNING, D.    Culture (A)  Final    STAPHYLOCOCCUS SIMULANS SUSCEPTIBILITIES PERFORMED ON PREVIOUS CULTURE WITHIN THE LAST 5 DAYS. Performed at [facility], [place] [address], [place], [place] [address]    Report Status 04/08/2021 FINAL  Final  Blood Culture ID Panel (Reflexed)     Status: Abnormal   Collection Time: 04/05/21 10:39 PM  Result Value Ref Range Status   Enterococcus faecalis NOT DETECTED NOT DETECTED Final   Enterococcus Faecium NOT DETECTED NOT DETECTED Final   Listeria monocytogenes NOT DETECTED NOT DETECTED Final   Staphylococcus species DETECTED (A) NOT DETECTED Final    Comment: CRITICAL RESULT CALLED TO, READ BACK BY AND VERIFIED WITH: JESSICA HEARN RN 04/06/21 @2039  BY JW    Staphylococcus aureus (BCID) NOT DETECTED NOT DETECTED Final   Staphylococcus epidermidis NOT DETECTED NOT DETECTED Final   Staphylococcus lugdunensis NOT DETECTED NOT DETECTED Final   Streptococcus species NOT  DETECTED NOT DETECTED Final   Streptococcus agalactiae NOT DETECTED NOT DETECTED Final   Streptococcus pneumoniae  NOT DETECTED NOT DETECTED Final   Streptococcus pyogenes NOT DETECTED NOT DETECTED Final   A.calcoaceticus-baumannii NOT DETECTED NOT DETECTED Final   Bacteroides fragilis NOT DETECTED NOT DETECTED Final   Enterobacterales NOT DETECTED NOT DETECTED Final   Enterobacter cloacae complex NOT DETECTED NOT DETECTED Final   Escherichia coli NOT DETECTED NOT DETECTED Final   Klebsiella aerogenes NOT DETECTED NOT DETECTED Final   Klebsiella oxytoca NOT DETECTED NOT DETECTED Final   Klebsiella pneumoniae NOT DETECTED NOT DETECTED Final   Proteus species NOT DETECTED NOT DETECTED Final   Salmonella species NOT DETECTED NOT DETECTED Final   Serratia marcescens NOT DETECTED NOT DETECTED Final   Haemophilus influenzae NOT DETECTED NOT DETECTED Final   Neisseria meningitidis NOT DETECTED NOT DETECTED Final   Pseudomonas aeruginosa NOT DETECTED NOT DETECTED Final   Stenotrophomonas maltophilia NOT DETECTED NOT DETECTED Final   Candida albicans NOT DETECTED NOT DETECTED Final   Candida auris NOT DETECTED NOT DETECTED Final   Candida glabrata NOT DETECTED NOT DETECTED Final   Candida krusei NOT DETECTED NOT DETECTED Final   Candida parapsilosis NOT DETECTED NOT DETECTED Final   Candida tropicalis NOT DETECTED NOT DETECTED Final   Cryptococcus neoformans/gattii NOT DETECTED NOT DETECTED Final    Comment: Performed at [facility], [address] [place], [place] [address]  MRSA Next Gen by PCR, Nasal     Status: None   Collection Time: 04/06/21  8:45 AM   Specimen: Nasal Mucosa; Nasal Swab  Result Value Ref Range Status   MRSA by PCR Next Gen NOT DETECTED NOT DETECTED Final    Comment: (NOTE) The GeneXpert MRSA Assay (FDA approved for NASAL specimens only), is one component of a comprehensive MRSA colonization surveillance program. It is not intended to diagnose MRSA infection nor  to guide or monitor treatment for MRSA infections. Test performance is not FDA approved in patients less than 60 years old. Performed at [facility], [address] [place], [place] [address]          Radiology Studies: [place] ASCITES (ABDOMEN LIMITED)  Result Date: 04/09/2021 CLINICAL DATA:  Cirrhosis, ascites, abdominal distension, for therapeutic paracentesis EXAM: LIMITED ABDOMEN ULTRASOUND FOR ASCITES TECHNIQUE: Limited ultrasound survey for ascites was performed in all four abdominal quadrants. COMPARISON:  04/06/2021 FINDINGS: Only small volume of ascites is identified. Volume of ascites visualize is insufficient for expected therapeutic benefit from paracentesis. IMPRESSION: Insufficient ascites for therapeutic paracentesis. Electronically Signed   By: Lavonia Dana M.D.   On: 04/09/2021 14:20        Scheduled Meds:  Chlorhexidine Gluconate Cloth  6 each Topical Daily   feeding supplement (GLUCERNA SHAKE)  237 mL Oral TID BM   furosemide  20 mg Intravenous Q12H   insulin aspart  0-5 Units Subcutaneous QHS   insulin aspart  0-9 Units Subcutaneous TID WC   insulin aspart  4 Units Subcutaneous TID WC   insulin glargine-yfgn  10 Units Subcutaneous QHS   lactulose  30 g Oral TID   levothyroxine  50 mcg Oral QAC breakfast   lipase/protease/amylase  48,000 Units Oral TID AC   rifaximin  550 mg Oral BID   sodium chloride flush  10-40 mL Intracatheter Q12H   spironolactone  25 mg Oral Daily   Continuous Infusions:  albumin human 25 g (04/09/21 1742)   meropenem (MERREM) IV Stopped (04/09/21 1350)   norepinephrine (LEVOPHED) Adult infusion Stopped (04/07/21 1058)     LOS: 3 days    Time spent:  61mns    JKathie Dike MD [facility]   If 7PM-7AM, please contact night-coverage [email or web address]  04/09/2021, 6:46 PM

## 2021-04-09 NOTE — Progress Notes (Signed)
Initial Nutrition Assessment  DOCUMENTATION CODES:   Non-severe (moderate) malnutrition in context of chronic illness  INTERVENTION:  Glucerna Shake po TID, each supplement provides 220 kcal and 10 grams of protein   Heart Healthy diet  NUTRITION DIAGNOSIS:   Moderate Malnutrition related to chronic illness (cirrhosis) as evidenced by energy intake < or equal to 75% for > or equal to 1 month, mild muscle depletion, mild fat depletion.   GOAL:  Patient will meet greater than or equal to 90% of their needs   MONITOR:  PO intake, Labs, I & O's, Weight trends, Supplement acceptance  REASON FOR ASSESSMENT:   Consult Malnutrition Eval  ASSESSMENT: Patient is a 56 yo female with hx of NASH cirrhosis,  pancreatitis, GERD, DM2, DJD.   Presents with confusion, lethargy, sepsis due to possible UTI.   Patient has not been following a low sodium diet at home. She likes soups and grilled cheese sandwiches. Education pt of high sodium content of soup and cheese. Emphasized the importance of consistent energy/protein 4-6 meals daily given her cirrhosis. Patient reports her "liver doctor" liberalized her diet restrictions.  She prepares simple foods for herself and has someone Sam who does her grocery shopping. He is here with her little dog.   Meal intake 1/19- 100% breakfast and 50% of lunch. Able to feed herself.   Patient usual weight reported ~ 125 lb (56.8 kg). Current wt 66 kg. Gain of 11%/7 kg x 1 month. Generalized edema. Upper and lower extremities. Skin is tight and painful response from pt when touched. Patient ambulates with a cane or walker at baseline.   Medications reviewed and include: Lasix, Insulin, lactulose, Creon.   Albumin 25 gr x 4 and Merrem.  Labs:  BMP Latest Ref Rng & Units 04/09/2021 04/08/2021 04/07/2021  Glucose 70 - 99 mg/dL 179(X) 505(W) 979(Y)  BUN 6 - 20 mg/dL 80(X) 65(V) 37(S)  Creatinine 0.44 - 1.00 mg/dL 8.27(M) 7.86 7.54  Sodium 135 - 145 mmol/L  133(L) 134(L) 129(L)  Potassium 3.5 - 5.1 mmol/L 3.7 3.6 3.9  Chloride 98 - 111 mmol/L 100 100 97(L)  CO2 22 - 32 mmol/L 28 28 26   Calcium 8.9 - 10.3 mg/dL ) 4.9(E) 7.8(L)      NUTRITION - FOCUSED PHYSICAL EXAM: limited exam due to generalized edema upper and lower extremities painful to touch per patient.  Flowsheet Row Most Recent Value  Orbital Region Mild depletion  Upper Arm Region No depletion  Thoracic and Lumbar Region No depletion  Buccal Region Mild depletion  Temple Region Mild depletion  Clavicle Bone Region Mild depletion  Clavicle and Acromion Bone Region Mild depletion  Dorsal Hand Unable to assess  [due to edema]  Anterior Thigh Region Unable to assess  [due to edema]  Posterior Calf Region Unable to assess  [due to edema]  Edema (RD Assessment) Moderate  Hair Reviewed  Eyes Reviewed  Mouth Reviewed  Skin Reviewed  Nails Reviewed       Diet Order:   Diet Order             Diet Heart Room service appropriate? Yes; Fluid consistency: Thin  Diet effective now                   EDUCATION NEEDS:  Education needs have been addressed  Skin:  Skin Assessment: Reviewed RN Assessment  Last BM:  1/20- type 7 large and small   Height:   Ht Readings from Last 1 Encounters:  04/06/21  5\' 3"  (1.6 m)    Weight:   Wt Readings from Last 1 Encounters:  04/07/21 66 kg    Ideal Body Weight:   52 kg  BMI:  Body mass index is 25.77 kg/m.  Estimated Nutritional Needs: based on EDW-56.8 kg  Kcal:   04/09/21   Protein:   75-80 gr  Fluid:   1.7-1.8 liters daily   [id number] MS,RD,CSG,LDN Contact: Royann Shivers

## 2021-04-09 NOTE — Evaluation (Signed)
Physical Therapy Evaluation Patient Details Name: [name] MRN: [id number] DOB: 27-Jul-1965 Today's Date: 04/09/2021  History of Present Illness  [name] is a 56 y.o. female with medical history significant for NASH cirrhosis, type 2 diabetes mellitus, hypothyroidism, thrombocytopenia, GERD who presents to the emergency department via EMS due to several day history of lethargy and confusion.  Patient states that she has been sleeping a lot since Sunday (2 days ago) and not taking her medications including lactulose, she states that she was without energy and that she has not been eating either.  She complained of some abdominal pain due to abdominal distention.  Patient states that she was recently diagnosed with UTI at [facility] and was prescribed with Macrobid which she was not sure if she was taking since 2 days ago when she was so weak and sleepy.  Her significant other was concerned of her lethargy and confusion, so he insisted that patient should go to the ED for further evaluation and management.  She denies chest pain, shortness of breath, fever, chills, nausea, vomiting   Clinical Impression   Demonstrates generalized weakness and reduced functional activity tolerance and needing min A for bed mobility and CGA for transfers due to weakness and unsteadiness present.  Ambulation initiated with RW and portable O2 concentrator with pt demonstrating quick onset of fatigue with ambulation x15 ft and O2 sat from 92% to 88% with exertion needing seated rest periods with quick recovery appreciated.  Overall, improving in functional status and would benefit from continued PT services while hospitalized to restore capabilities to PLOF.  Home health may be of benefit once returned home to continue with physical rehabilitation.      Recommendations for follow up therapy are one component of a multi-disciplinary discharge planning process, led by the attending physician.  Recommendations  may be updated based on patient status, additional functional criteria and insurance authorization.  Follow Up Recommendations Home health PT    Assistance Recommended at Discharge Intermittent Supervision/Assistance  Patient can return home with the following  A little help with walking and/or transfers;A little help with bathing/dressing/bathroom;Assistance with cooking/housework    Equipment Recommendations None recommended by PT  Recommendations for Other Services       Functional Status Assessment Patient has had a recent decline in their functional status and demonstrates the ability to make significant improvements in function in a reasonable and predictable amount of time.     Precautions / Restrictions        Mobility  Bed Mobility Overal bed mobility: Needs Assistance Bed Mobility: Supine to Sit, Sit to Supine     Supine to sit: Min assist Sit to supine: Min assist     Patient Response: Cooperative  Transfers Overall transfer level: Needs assistance Equipment used: Rolling walker (2 wheels) Transfers: Sit to/from Stand, Bed to chair/wheelchair/BSC Sit to Stand: Min guard                Ambulation/Gait Ambulation/Gait assistance: Min guard Gait Distance (Feet): 15 Feet Assistive device: Rolling walker (2 wheels) Gait Pattern/deviations: Step-to pattern       General Gait Details: short steps, quick onset of fatigue  Stairs            Wheelchair Mobility    Modified Rankin (Stroke Patients Only)       Balance Overall balance assessment: Mild deficits observed, not formally tested  Pertinent Vitals/Pain Pain Assessment Pain Assessment: No/denies pain    Home Living Family/patient expects to be discharged to:: Private residence Living Arrangements: Spouse/significant other Available Help at Discharge: Family;Available PRN/intermittently Type of Home: House Home Access:  Ramped entrance       Home Layout: One level Home Equipment: Agricultural consultant (2 wheels);Rollator (4 wheels);Cane - single point;Wheelchair - manual      Prior Function Prior Level of Function : Independent/Modified Independent                     Hand Dominance        Extremity/Trunk Assessment   Upper Extremity Assessment Upper Extremity Assessment: Generalized weakness    Lower Extremity Assessment Lower Extremity Assessment: Generalized weakness       Communication   Communication: No difficulties  Cognition Arousal/Alertness: Awake/alert Behavior During Therapy: WFL for tasks assessed/performed Overall Cognitive Status: Within Functional Limits for tasks assessed                                          General Comments      Exercises     Assessment/Plan    PT Assessment Patient needs continued PT services  PT Problem List Decreased strength;Decreased activity tolerance;Decreased balance;Decreased mobility       PT Treatment Interventions DME instruction;Gait training;Stair training;Functional mobility training;Therapeutic activities;Patient/family education;Neuromuscular re-education;Balance training;Therapeutic exercise;Wheelchair mobility training;Manual techniques    PT Goals (Current goals can be found in the Care Plan section)  Acute Rehab PT Goals Patient Stated Goal: return home PT Goal Formulation: With patient Time For Goal Achievement: 04/16/21 Potential to Achieve Goals: Good    Frequency Min 3X/week     Co-evaluation               AM-PAC PT "6 Clicks" Mobility  Outcome Measure Help needed turning from your back to your side while in a flat bed without using bedrails?: A Little Help needed moving from lying on your back to sitting on the side of a flat bed without using bedrails?: A Little Help needed moving to and from a bed to a chair (including a wheelchair)?: A Little Help needed standing up from a  chair using your arms (e.g., wheelchair or bedside chair)?: A Little Help needed to walk in hospital room?: A Little Help needed climbing 3-5 steps with a railing? : A Little 6 Click Score: 18    End of Session Equipment Utilized During Treatment: Gait belt Activity Tolerance: Patient tolerated treatment well;Patient limited by fatigue Patient left: in chair;with nursing/sitter in room Nurse Communication: Mobility status PT Visit Diagnosis: Unsteadiness on feet (R26.81);Muscle weakness (generalized) (M62.81);Difficulty in walking, not elsewhere classified (R26.2)    Time: 0962-8366 PT Time Calculation (min) (ACUTE ONLY): 27 min   Charges:   PT Evaluation $PT Eval Low Complexity: 1 Low PT Treatments $Gait Training: 8-22 mins        5:22 PM, 04/09/21 M. Shary Decamp, PT, DPT Physical Therapist- [place] Office Number: [phone]

## 2021-04-09 NOTE — Plan of Care (Signed)
°  Problem: Acute Rehab PT Goals(only PT should resolve) Goal: Pt Will Go Supine/Side To Sit Outcome: Progressing Flowsheets (Taken 04/09/2021 1723) Pt will go Supine/Side to Sit: Independently Goal: Pt Will Go Sit To Supine/Side Outcome: Progressing Flowsheets (Taken 04/09/2021 1723) Pt will go Sit to Supine/Side: Independently Goal: Patient Will Transfer Sit To/From Stand Outcome: Progressing Flowsheets (Taken 04/09/2021 1723) Patient will transfer sit to/from stand: with supervision Goal: Pt Will Transfer Bed To Chair/Chair To Bed Outcome: Progressing Flowsheets (Taken 04/09/2021 1723) Pt will Transfer Bed to Chair/Chair to Bed: with supervision Goal: Pt Will Ambulate Outcome: Progressing Flowsheets (Taken 04/09/2021 1723) Pt will Ambulate:  100 feet  with supervision  with rolling walker  5:24 PM, 04/09/21 M. Shary Decamp, PT, DPT Physical Therapist- [place] Office Number: [phone]

## 2021-04-09 NOTE — Progress Notes (Signed)
Inpatient Diabetes Program Recommendations  AACE/ADA: New Consensus Statement on Inpatient Glycemic Control  Target Ranges:  Prepandial:   less than 140 mg/dL      Peak postprandial:   less than 180 mg/dL (1-2 hours)      Critically ill patients:  140 - 180 mg/dL    Latest Reference Range & Units 04/09/21 07:31  Glucose-Capillary 70 - 99 mg/dL 390 (H)    Latest Reference Range & Units 04/08/21 07:53 04/08/21 16:11 04/08/21 18:22 04/08/21 22:34  Glucose-Capillary 70 - 99 mg/dL 300 (H) 923 (H) 300 (H) 246 (H)    Review of Glycemic Control  Diabetes history: DM2 Outpatient Diabetes medications: Insulin Pump with Novolog Current orders for Inpatient glycemic control: Semglee 10 units QHS, Novolog 0-9 units TID with meals, Novolog 0-5 units QHS  Inpatient Diabetes Program Recommendations:    Insulin: Please consider increasing Semglee to 14 units QHS and ordering Novolog 4 units TID with meals for meal coverage if patient eats at least 50% of meals.  Thanks, Orlando Penner, RN, MSN, CDE Diabetes Coordinator Inpatient Diabetes Program [phone] (Team Pager from 8am to 5pm)

## 2021-04-10 LAB — GLUCOSE, CAPILLARY
Glucose-Capillary: 261 mg/dL — ABNORMAL HIGH (ref 70–99)
Glucose-Capillary: 246 mg/dL — ABNORMAL HIGH (ref 70–99)
Glucose-Capillary: 243 mg/dL — ABNORMAL HIGH (ref 70–99)
Glucose-Capillary: 268 mg/dL — ABNORMAL HIGH (ref 70–99)

## 2021-04-10 LAB — AMMONIA: Ammonia: 76 umol/L — ABNORMAL HIGH (ref 9–35)

## 2021-04-10 LAB — CBC
HCT: 22.2 % — ABNORMAL LOW (ref 36.0–46.0)
Hemoglobin: 7.5 g/dL — ABNORMAL LOW (ref 12.0–15.0)
MCH: 34.4 pg — ABNORMAL HIGH (ref 26.0–34.0)
MCHC: 33.8 g/dL (ref 30.0–36.0)
MCV: 101.8 fL — ABNORMAL HIGH (ref 80.0–100.0)
Platelets: 32 10*3/uL — ABNORMAL LOW (ref 150–400)
RBC: 2.18 MIL/uL — ABNORMAL LOW (ref 3.87–5.11)
RDW: 21.9 % — ABNORMAL HIGH (ref 11.5–15.5)
WBC: 4.6 10*3/uL (ref 4.0–10.5)
nRBC: 0 % (ref 0.0–0.2)

## 2021-04-10 LAB — COMPREHENSIVE METABOLIC PANEL
ALT: 17 U/L (ref 0–44)
AST: 46 U/L — ABNORMAL HIGH (ref 15–41)
Albumin: 2.1 g/dL — ABNORMAL LOW (ref 3.5–5.0)
Alkaline Phosphatase: 76 U/L (ref 38–126)
Anion gap: 5 (ref 5–15)
BUN: 24 mg/dL — ABNORMAL HIGH (ref 6–20)
CO2: 28 mmol/L (ref 22–32)
Calcium: 8.4 mg/dL — ABNORMAL LOW (ref 8.9–10.3)
Chloride: 101 mmol/L (ref 98–111)
Creatinine, Ser: 0.33 mg/dL — ABNORMAL LOW (ref 0.44–1.00)
GFR, Estimated: >60 mL/min (ref >60)
Glucose, Bld: 271 mg/dL — ABNORMAL HIGH (ref 70–99)
Potassium: 3.2 mmol/L — ABNORMAL LOW (ref 3.5–5.1)
Sodium: 134 mmol/L — ABNORMAL LOW (ref 135–145)
Total Bilirubin: 13.1 mg/dL — ABNORMAL HIGH (ref 0.3–1.2)
Total Protein: 4.2 g/dL — ABNORMAL LOW (ref 6.5–8.1)

## 2021-04-10 MED ORDER — POTASSIUM CHLORIDE 10 MEQ/100ML IV SOLN
10.0000 meq | INTRAVENOUS | Status: AC
  Administered 2021-04-10 (×2): 10 meq via INTRAVENOUS
  Filled 2021-04-10 (×2): qty 100

## 2021-04-10 MED ORDER — LACTULOSE 10 GM/15ML PO SOLN
30.0000 g | ORAL | Status: AC
Start: 2021-04-10 — End: 2021-04-10
  Administered 2021-04-10: 30 g via ORAL

## 2021-04-10 NOTE — Progress Notes (Signed)
She denies PROGRESS NOTE    [name]  OEH:[id number] DOB: [birth year] DOA: 04/05/2021 PCP: [facility]    Brief Narrative:  55 year old female with a history of cirrhosis, admitted to the hospital with confusion and lethargy.  Found to have sepsis from possible UTI.  She was also found to have positive blood cultures and is on antibiotics.  Ammonia is noted to be elevated she is on lactulose and Xifaxan.  She is also noted to be anemic and has been receiving PRBC transfusions.  For hypotension, she has required Levophed infusion which has since been weaned off.   Assessment & Plan:   Principal Problem:   Sepsis secondary to UTI [facility]) Active Problems:   Gastroesophageal reflux   Thrombocytopenia (HCC)   Alcoholic cirrhosis (HCC)   Hypoalbuminemia due to protein-calorie malnutrition (HCC)   Generalized weakness   Gastro-esophageal reflux disease without esophagitis   Macrocytic anemia   Adult hypothyroidism   Supratherapeutic INR   Hyponatremia   Hyperglycemia due to diabetes mellitus (HCC)   Severe sepsis -Possibly related to possible UTI.  She does have a history of ESBL E. Coli -Currently on meropenem, will follow-up sensitivities -She had 2 sets of blood cultures drawn.  3 out of 4 bottles positive for Staphylococcus simulans.  Discussed with ID Dr. Gale Journey who recommended repeating blood cultures today -She briefly required Levophed infusion, but has since been weaned off. 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  She does not have any right upper quadrant pain or vomiting. -We will continue to monitor for now -Restarted on IV albumin and Lasix/Aldactone -Ultrasound abdomen did not show a significant amount of ascites for therapeutic paracentesis  Hypothyroidism -Continue Synthroid  Insulin dependent diabetes mellitus -Followed by [facility] at [facility] -She appears to be on an insulin pump as an outpatient. -Currently on sliding scale insulin -Currently on basal insulin -added NovoLog for meal coverage  Chronic respiratory failure with hypoxia -Chronically on 2 to 3 L of supplemental oxygen    DVT prophylaxis: SCDs Start: 04/06/21 0834  Code Status: DNR Family Communication: At patient's request, I updated her significant other over the phone 1/18 Disposition Plan: Status is: Inpatient  Remains inpatient appropriate because: Continued IV antibiotics and management of encephalopathy.         Consultants:    Procedures:    Antimicrobials:  Meropenem 1/17 > Vancomycin 1/18 > 1/18   Subjective: She says she feels sleepy today and is finding it difficult to stay awake  Objective: Vitals:   04/09/21 2159 04/10/21 0429 04/10/21 0608 04/10/21 1420  BP: (!) 116/48 (!) 110/47 (!) 112/57 (!) 121/54  Pulse: 81 86 84 74  Resp: 20 19  16   Temp: 98.8 F (37.1 C) 98.6 F (37 C)  98 F (36.7 C)  TempSrc: Oral   Oral  SpO2: 95% 91% 94% 92%  Weight:  Height:        Intake/Output Summary (Last 24 hours) at 04/10/2021 1903 Last data filed at 04/10/2021 1700 Gross per 24 hour  Intake 1080 ml  Output --  Net 1080 ml   Filed Weights   04/06/21 0833 04/07/21 0543  Weight: 62.3 kg 66 kg    Examination: General exam:  somnolent but wakes up to voice and can carry on a conversation Respiratory system: Clear to auscultation. Respiratory effort normal. Cardiovascular system:RRR. No murmurs, rubs, gallops. Gastrointestinal system: Abdomen is distended, soft and nontender. No organomegaly or masses felt. Normal bowel sounds heard. Central nervous system: Alert and oriented. No focal neurological deficits. Extremities: 1+ edema Skin: No rashes, lesions or ulcers Psychiatry: Judgement and insight appear normal. Mood & affect appropriate.       Data Reviewed: I have personally reviewed following labs and imaging studies  CBC: Recent Labs  Lab 04/05/21 2030 04/07/21 0450 04/08/21 0443 04/09/21 0352 04/10/21 0427  WBC 10.4 6.2 6.2 4.2 4.6  NEUTROABS 8.9*  --   --   --   --   HGB 7.8* 6.5* 8.4* 7.2* 7.5*  HCT 24.9* 19.7* 24.4* 21.9* 22.2*  MCV 115.8* 107.7* 99.2 99.5 101.8*  PLT 54* 40* 40* 34* 32*   Basic Metabolic Panel: Recent Labs  Lab 04/05/21 2030 04/06/21 0855 04/07/21 0450 04/08/21 0443 04/09/21 0352 04/10/21 0427  NA 132*  --  129* 134* 133* 134*  K 4.9  --  3.9 3.6 3.7 3.2*  CL 95*  --  97* 100 100 101  CO2 23  --  26 28 28 28   GLUCOSE 147*  --  283* 227* 306* 271*  BUN 17  --  29* 33* 26* 24*  CREATININE 1.00  --  0.73 0.67 0.38* 0.33*  CALCIUM 7.9*  --  7.8* 8.4* 8.6* 8.4*  MG  --  1.4* 1.8 1.8  --   --   PHOS  --  4.2  --  2.3*  --   --    GFR: Estimated Creatinine Clearance: 70.8 mL/min (A) (by C-G formula based on SCr of 0.33 mg/dL (L)). Liver Function Tests: Recent Labs  Lab 04/05/21 2030 04/07/21 0450 04/08/21 0443 04/09/21 0352 04/10/21 0427  AST 82* 57* 58* 48* 46*  ALT 25 27 24 21 17   ALKPHOS 97 67 82 73 76  BILITOT 20.7* 17.7* 16.5* 14.1* 13.1*  PROT 5.3* 3.8* 4.3* 4.3* 4.2*  ALBUMIN 1.8* <1.5* 1.5* 1.8* 2.1*   Recent Labs  Lab 04/06/21 0727  LIPASE 59*   Recent Labs  Lab 04/05/21 2030 04/07/21 0450 04/08/21 0443 04/10/21 1630  AMMONIA 31 94*  46* 76*   Coagulation Profile: Recent Labs  Lab 04/05/21 2231  INR 3.4*   Cardiac Enzymes: No results for input(s): CKTOTAL, CKMB, CKMBINDEX, TROPONINI in the last 168 hours. BNP (last 3 results) No results for input(s): PROBNP in the last 8760 hours. HbA1C: No results for input(s): HGBA1C in the last 72 hours. CBG: Recent Labs  Lab 04/09/21 1639 04/09/21 2202 04/10/21 0721 04/10/21 1124 04/10/21 1631  GLUCAP 249* 249* 261* 246* 243*   Lipid Profile: No results for input(s): CHOL, HDL, LDLCALC, TRIG, CHOLHDL, LDLDIRECT in the last 72 hours. Thyroid Function Tests: No results for input(s): TSH, T4TOTAL, FREET4, T3FREE, THYROIDAB in the last 72 hours. Anemia Panel: No results for input(s): VITAMINB12, FOLATE, FERRITIN, TIBC, IRON, RETICCTPCT in the last 72 hours.  Sepsis Labs: Recent Labs  Lab 04/06/21 0030 04/06/21 0855 04/06/21 1040 04/07/21  0450  LATICACIDVEN 7.2* 6.0* 4.1* 2.8*    Recent Results (from the past 240 hour(s))  Urine Culture     Status: Abnormal   Collection Time: 04/05/21 12:36 AM   Specimen: Urine, Clean Catch  Result Value Ref Range Status   Specimen Description   Final    URINE, CLEAN CATCH Performed at [facility], [address] [place], [place] [address]    Special Requests   Final    NONE Performed at [facility], [address] [place], [place] [address]    Culture (A)  Final    50,000 COLONIES/mL ESCHERICHIA COLI Confirmed Extended Spectrum Beta-Lactamase Producer (ESBL).  In bloodstream infections from ESBL organisms, carbapenems are preferred over piperacillin/tazobactam. They are shown to have a lower risk of mortality.    Report Status 04/08/2021 FINAL  Final   Organism ID, Bacteria ESCHERICHIA COLI (A)  Final      Susceptibility   Escherichia coli - MIC*    AMPICILLIN >=32 RESISTANT Resistant     CEFAZOLIN >=64 RESISTANT Resistant     CEFEPIME 16 RESISTANT Resistant     CEFTRIAXONE >=64 RESISTANT Resistant      CIPROFLOXACIN >=4 RESISTANT Resistant     GENTAMICIN <=1 SENSITIVE Sensitive     IMIPENEM <=0.25 SENSITIVE Sensitive     NITROFURANTOIN <=16 SENSITIVE Sensitive     TRIMETH/SULFA <=20 SENSITIVE Sensitive     AMPICILLIN/SULBACTAM >=32 RESISTANT Resistant     PIP/TAZO 32 INTERMEDIATE Intermediate     * 50,000 COLONIES/mL ESCHERICHIA COLI  Resp Panel by RT-PCR (Flu A&B, Covid) Nasopharyngeal Swab     Status: None   Collection Time: 04/05/21 10:15 PM   Specimen: Nasopharyngeal Swab; Nasopharyngeal(NP) swabs in vial transport medium  Result Value Ref Range Status   SARS Coronavirus 2 by RT PCR NEGATIVE NEGATIVE Final    Comment: (NOTE) SARS-CoV-2 target nucleic acids are NOT DETECTED.  The SARS-CoV-2 RNA is generally detectable in upper respiratory specimens during the acute phase of infection. The lowest concentration of SARS-CoV-2 viral copies this assay can detect is 138 copies/mL. A negative result does not preclude SARS-Cov-2 infection and should not be used as the sole basis for treatment or other patient management decisions. A negative result may occur with  improper specimen collection/handling, submission of specimen other than nasopharyngeal swab, presence of viral mutation(s) within the areas targeted by this assay, and inadequate number of viral copies(<138 copies/mL). A negative result must be combined with clinical observations, patient history, and epidemiological information. The expected result is Negative.  Fact Sheet for Patients:  EntrepreneurPulse.com.au  Fact Sheet for Healthcare Providers:  IncredibleEmployment.be  This test is no t yet approved or cleared by the [place] FDA and  has been authorized for detection and/or diagnosis of SARS-CoV-2 by FDA under an Emergency Use Authorization (EUA). This EUA will remain  in effect (meaning this test can be used) for the duration of the COVID-19 declaration under Section  564(b)(1) of the Act, 21 U.S.C.section 360bbb-3(b)(1), unless the authorization is terminated  or revoked sooner.       Influenza A by PCR NEGATIVE NEGATIVE Final   Influenza B by PCR NEGATIVE NEGATIVE Final    Comment: (NOTE) The Xpert Xpress SARS-CoV-2/FLU/RSV plus assay is intended as an aid in the diagnosis of influenza from Nasopharyngeal swab specimens and should not be used as a sole basis for treatment. Nasal washings and aspirates are unacceptable for Xpert Xpress SARS-CoV-2/FLU/RSV testing.  Fact Sheet for Patients: EntrepreneurPulse.com.au  Fact Sheet  for Healthcare Providers: IncredibleEmployment.be  This test is not yet approved or cleared by the [place] and has been authorized for detection and/or diagnosis of SARS-CoV-2 by FDA under an Emergency Use Authorization (EUA). This EUA will remain in effect (meaning this test can be used) for the duration of the COVID-19 declaration under Section 564(b)(1) of the Act, 21 U.S.C. section 360bbb-3(b)(1), unless the authorization is terminated or revoked.  Performed at [facility], [address] [place], [place] [address]   Blood Culture (routine x 2)     Status: Abnormal   Collection Time: 04/05/21 10:39 PM   Specimen: Left Antecubital; Blood  Result Value Ref Range Status   Specimen Description   Final    LEFT ANTECUBITAL Performed at [facility], [address], [place], [place] [address]    Special Requests   Final    BOTTLES DRAWN AEROBIC AND ANAEROBIC Blood Culture adequate volume Performed at [facility], [address], [place], [place] [address]    Culture  Setup Time   Final    GRAM POSITIVE COCCI IN CLUSTERS IN BOTH AEROBIC AND ANAEROBIC BOTTLES CALL PREVIOUS TO ICU FOILEY, B. Organism ID to follow CRITICAL RESULT CALLED TO, READ BACK BY AND VERIFIED WITH: JESSICA HEARN RN 04/06/21 @2039  BY JW Performed at [facility], [place] [address], [place],  [place] [address]    Culture STAPHYLOCOCCUS SIMULANS (A)  Final   Report Status 04/08/2021 FINAL  Final   Organism ID, Bacteria STAPHYLOCOCCUS SIMULANS  Final      Susceptibility   Staphylococcus simulans - MIC*    CIPROFLOXACIN <=0.5 SENSITIVE Sensitive     ERYTHROMYCIN >=8 RESISTANT Resistant     GENTAMICIN <=0.5 SENSITIVE Sensitive     OXACILLIN <=0.25 SENSITIVE Sensitive     TETRACYCLINE <=1 SENSITIVE Sensitive     VANCOMYCIN <=0.5 SENSITIVE Sensitive     TRIMETH/SULFA <=10 SENSITIVE Sensitive     CLINDAMYCIN >=8 RESISTANT Resistant     RIFAMPIN 16 RESISTANT Resistant     Inducible Clindamycin NEGATIVE Sensitive     * STAPHYLOCOCCUS SIMULANS  Blood Culture (routine x 2)     Status: Abnormal   Collection Time: 04/05/21 10:39 PM   Specimen: BLOOD RIGHT ARM  Result Value Ref Range Status   Specimen Description   Final    BLOOD RIGHT ARM Performed at [facility], [address] [place], [place] [address]    Special Requests   Final    BOTTLES DRAWN AEROBIC ONLY Blood Culture adequate volume Performed at [facility], [address], [place], [place] [address]    Culture  Setup Time   Final    GRAM POSITIVE COCCI IN CLUSTERS AEROBIC BOTTLE ONLY  RESULTS CALLED AND READ BACK FROM FOILY, B AT 1400 ON 04/06/21 BY BROWNING, D.    Culture (A)  Final    STAPHYLOCOCCUS SIMULANS SUSCEPTIBILITIES PERFORMED ON PREVIOUS CULTURE WITHIN THE LAST 5 DAYS. Performed at [facility], [place] [address] [place], [place] [address]    Report Status 04/08/2021 FINAL  Final  Blood Culture ID Panel (Reflexed)     Status: Abnormal   Collection Time: 04/05/21 10:39 PM  Result Value Ref Range Status   Enterococcus faecalis NOT DETECTED NOT DETECTED Final   Enterococcus Faecium NOT DETECTED NOT DETECTED Final   Listeria monocytogenes NOT DETECTED NOT DETECTED Final   Staphylococcus species DETECTED (A) NOT DETECTED Final    Comment: CRITICAL RESULT CALLED TO, READ BACK BY AND VERIFIED WITH:  JESSICA  HEARN RN 04/06/21 @2039  BY JW    Staphylococcus aureus (BCID) NOT DETECTED NOT DETECTED Final   Staphylococcus epidermidis NOT DETECTED NOT DETECTED Final   Staphylococcus lugdunensis NOT DETECTED NOT DETECTED Final   Streptococcus species NOT DETECTED NOT DETECTED Final   Streptococcus agalactiae NOT DETECTED NOT DETECTED Final   Streptococcus pneumoniae NOT DETECTED NOT DETECTED Final   Streptococcus pyogenes NOT DETECTED NOT DETECTED Final   A.calcoaceticus-baumannii NOT DETECTED NOT DETECTED Final   Bacteroides fragilis NOT DETECTED NOT DETECTED Final   Enterobacterales NOT DETECTED NOT DETECTED Final   Enterobacter cloacae complex NOT DETECTED NOT DETECTED Final   Escherichia coli NOT DETECTED NOT DETECTED Final   Klebsiella aerogenes NOT DETECTED NOT DETECTED Final   Klebsiella oxytoca NOT DETECTED NOT DETECTED Final   Klebsiella pneumoniae NOT DETECTED NOT DETECTED Final   Proteus species NOT DETECTED NOT DETECTED Final   Salmonella species NOT DETECTED NOT DETECTED Final   Serratia marcescens NOT DETECTED NOT DETECTED Final   Haemophilus influenzae NOT DETECTED NOT DETECTED Final   Neisseria meningitidis NOT DETECTED NOT DETECTED Final   Pseudomonas aeruginosa NOT DETECTED NOT DETECTED Final   Stenotrophomonas maltophilia NOT DETECTED NOT DETECTED Final   Candida albicans NOT DETECTED NOT DETECTED Final   Candida auris NOT DETECTED NOT DETECTED Final   Candida glabrata NOT DETECTED NOT DETECTED Final   Candida krusei NOT DETECTED NOT DETECTED Final   Candida parapsilosis NOT DETECTED NOT DETECTED Final   Candida tropicalis NOT DETECTED NOT DETECTED Final   Cryptococcus neoformans/gattii NOT DETECTED NOT DETECTED Final    Comment: Performed at [facility], [address] [place], [place] [address]  MRSA Next Gen by PCR, Nasal     Status: None   Collection Time: 04/06/21  8:45 AM   Specimen: Nasal Mucosa; Nasal Swab  Result Value Ref Range Status   MRSA by PCR Next  Gen NOT DETECTED NOT DETECTED Final    Comment: (NOTE) The GeneXpert MRSA Assay (FDA approved for NASAL specimens only), is one component of a comprehensive MRSA colonization surveillance program. It is not intended to diagnose MRSA infection nor to guide or monitor treatment for MRSA infections. Test performance is not FDA approved in patients less than 30 years old. Performed at [facility], [address] [place], [place] [address]   Culture, blood (routine x 2)     Status: None (Preliminary result)   Collection Time: 04/10/21  4:58 PM   Specimen: Left Antecubital; Blood  Result Value Ref Range Status   Specimen Description   Final    LEFT ANTECUBITAL BOTTLES DRAWN AEROBIC AND ANAEROBIC   Special Requests   Final    Blood Culture results may not be optimal due to an excessive volume of blood received in culture bottles Performed at [facility], [address] [place], [place] [address]    Culture PENDING  Incomplete   Report Status PENDING  Incomplete  Culture, blood (routine x 2)     Status: None (Preliminary result)   Collection Time: 04/10/21  5:09 PM   Specimen: BLOOD LEFT HAND  Result Value Ref Range Status   Specimen Description   Final    BLOOD LEFT HAND BOTTLES DRAWN AEROBIC AND ANAEROBIC   Special Requests   Final    Blood Culture results may not be optimal due to an excessive volume of blood received in culture bottles Performed at [facility], [address], [place], [place] [address]    Culture  PENDING  Incomplete   Report Status PENDING  Incomplete         Radiology Studies: [place] ASCITES (ABDOMEN LIMITED)  Result Date: 04/09/2021 CLINICAL DATA:  Cirrhosis, ascites, abdominal distension, for therapeutic paracentesis EXAM: LIMITED ABDOMEN ULTRASOUND FOR ASCITES TECHNIQUE: Limited ultrasound survey for ascites was performed in all four abdominal quadrants. COMPARISON:  04/06/2021 FINDINGS: Only small volume of ascites is identified. Volume of ascites visualize  is insufficient for expected therapeutic benefit from paracentesis. IMPRESSION: Insufficient ascites for therapeutic paracentesis. Electronically Signed   By: Lavonia Dana M.D.   On: 04/09/2021 14:20        Scheduled Meds:  Chlorhexidine Gluconate Cloth  6 each Topical Daily   feeding supplement (GLUCERNA SHAKE)  237 mL Oral TID BM   insulin aspart  0-5 Units Subcutaneous QHS   insulin aspart  0-9 Units Subcutaneous TID WC   insulin aspart  4 Units Subcutaneous TID WC   insulin glargine-yfgn  10 Units Subcutaneous QHS   lactulose  30 g Oral TID   lactulose  30 g Oral NOW   levothyroxine  50 mcg Oral QAC breakfast   lipase/protease/amylase  48,000 Units Oral TID AC   rifaximin  550 mg Oral BID   sodium chloride flush  10-40 mL Intracatheter Q12H   spironolactone  25 mg Oral Daily   Continuous Infusions:  meropenem (MERREM) IV 1 g (04/10/21 1218)   potassium chloride       LOS: 4 days    Time spent: 70mns    JKathie Dike MD [facility]   If 7PM-7AM, please contact night-coverage [email or web address]  04/10/2021, 7:03 PM

## 2021-04-10 NOTE — Progress Notes (Signed)
Pharmacy Antibiotic Note  [name] a 56 y.o. female admitted on 04/10/2021 with [place] has been consulted for merrem dosing.   Plan: Continue Merrem 1gm iv q8h F/U cxs and clinical progress Monitor V/S, labs and levels as indicated  Medical History: Past Medical History:  Diagnosis Date   Degenerative joint disease    Diabetes mellitus without complication ([place])    Gastroesophageal reflux    Pancreatitis    Panic attack    Sleep apnea    Thrombocytopenia (HCC)     Allergies:  Allergies  Allergen Reactions   Acetaminophen Hives and Other (See Comments)    Tolerates Fioricet   Cephalexin Itching, Swelling and Other (See Comments)    Tongue swells and makes throat itchy- can tolerate Zosyn   Ciprofloxacin Hives   Hydromorphone Hives   Sulfamethoxazole-Trimethoprim Shortness Of Breath, Swelling and Other (See Comments)    Tongue swells    Tizanidine Anaphylaxis   Abilify [Aripiprazole]     "I did noyt like the way this made me feel."    [facility] We[facility]   04/06/21 0833 04/07/21 0543  Weight: 62.3 kg (137 lb 5.6 oz) 66 kg (145 lb 8.1 oz)    CBC Latest Ref Rng & Units 04/10/2021 04/09/2021 04/08/2021  WBC 4.0 - 10.5 K/uL 4.6 4.2 6.2  Hemoglobin 12.0 - 15.0 g/dL 7.5(L) 7.2(L) 8.4(L)  Hematocrit 36.0 - 46.0 % 22.2(L) 21.9(L) 24.4(L)  Platelets 150 - 400 K/uL 32(L) 34(L) 40(L)     Estimated Creatinine Clearance: 70.8 mL/min (A) (by C-G formula based on SCr of 0.33 mg/dL (L)).  Antibiotics Given (last 72 hours)     Date/Time Action Medication Dose Rate   04/07/21 1048 Given   rifaximin (XIFAXAN) tablet 550 mg 550 mg    04/07/21 1058 New Bag/Given   vancomycin (VANCOREADY) IVPB 1250 mg/250 mL 1,250 mg 166.7 mL/hr   04/07/21 1340 New Bag/Given   meropenem (MERREM) 1 g in sodium chloride 0.9 % 100 mL IVPB 1 g 200 mL/hr   04/07/21 2113 Given   rifaximin (XIFAXAN) tablet 550 mg 550 mg    04/07/21 2115 New Bag/Given   meropenem (MERREM) 1 g in  sodium chloride 0.9 % 100 mL IVPB 1 g 200 mL/hr   04/07/21 2211 New Bag/Given   vancomycin (VANCOREADY) IVPB 750 mg/150 mL 750 mg 150 mL/hr   04/08/21 0552 New Bag/Given   meropenem (MERREM) 1 g in sodium chloride 0.9 % 100 mL IVPB 1 g 200 mL/hr   04/08/21 0904 Given   rifaximin (XIFAXAN) tablet 550 mg 550 mg    04/08/21 0907 New Bag/Given   vancomycin (VANCOREADY) IVPB 750 mg/150 mL 750 mg 150 mL/hr   04/08/21 1326 New Bag/Given   meropenem (MERREM) 1 g in sodium chloride 0.9 % 100 mL IVPB 1 g 200 mL/hr   04/08/21 2236 Given   rifaximin (XIFAXAN) tablet 550 mg 550 mg    04/09/21 0104 New Bag/Given   meropenem (MERREM) 1 g in sodium chloride 0.9 % 100 mL IVPB 1 g 200 mL/hr   04/09/21 0733 New Bag/Given   meropenem (MERREM) 1 g in sodium chloride 0.9 % 100 mL IVPB 1 g 200 mL/hr   04/09/21 0912 Given   rifaximin (XIFAXAN) tablet 550 mg 550 mg    04/09/21 1320 New Bag/Given   meropenem (MERREM) 1 g in sodium chloride 0.9 % 100 mL IVPB 1 g 200 mL/hr   04/09/21 2106 Given   rifaximin (XIFAXAN) tablet 550 mg 550 mg  04/09/21 2115 New Bag/Given   meropenem (MERREM) 1 g in sodium chloride 0.9 % 100 mL IVPB 1 g 200 mL/hr   04/10/21 0617 New Bag/Given   meropenem (MERREM) 1 g in sodium chloride 0.9 % 100 mL IVPB 1 g 200 mL/hr       Antimicrobials this admission: Meropenem 04/06/2021  >>  Ceftriaxone 1/16 x 1  Vancomycin 1/17 >> 1/19  Microbiology results: 04/06/2021  BCx: staph simulans 04/06/2021  UCx: 50K ESBL e. coli 04/06/2021  Resp Panel: negative   1/17 MRSA PCR is negative  Thank you for allowing pharmacy to be a part of this patients care.  Ramond Craver, PharmD Clinical Pharmacist

## 2021-04-11 ENCOUNTER — Inpatient Hospital Stay (HOSPITAL_COMMUNITY): Payer: Medicare Other

## 2021-04-11 ENCOUNTER — Encounter (HOSPITAL_COMMUNITY): Payer: Self-pay | Admitting: Internal Medicine

## 2021-04-11 LAB — GLUCOSE, CAPILLARY
Glucose-Capillary: 174 mg/dL — ABNORMAL HIGH (ref 70–99)
Glucose-Capillary: 310 mg/dL — ABNORMAL HIGH (ref 70–99)
Glucose-Capillary: 364 mg/dL — ABNORMAL HIGH (ref 70–99)
Glucose-Capillary: 295 mg/dL — ABNORMAL HIGH (ref 70–99)

## 2021-04-11 LAB — COMPREHENSIVE METABOLIC PANEL
ALT: 19 U/L (ref 0–44)
AST: 51 U/L — ABNORMAL HIGH (ref 15–41)
Albumin: 2.1 g/dL — ABNORMAL LOW (ref 3.5–5.0)
Alkaline Phosphatase: 84 U/L (ref 38–126)
Anion gap: 5 (ref 5–15)
BUN: 22 mg/dL — ABNORMAL HIGH (ref 6–20)
CO2: 27 mmol/L (ref 22–32)
Calcium: 8.1 mg/dL — ABNORMAL LOW (ref 8.9–10.3)
Chloride: 101 mmol/L (ref 98–111)
Creatinine, Ser: <0.3 mg/dL — ABNORMAL LOW (ref 0.44–1.00)
Glucose, Bld: 202 mg/dL — ABNORMAL HIGH (ref 70–99)
Potassium: 3.4 mmol/L — ABNORMAL LOW (ref 3.5–5.1)
Sodium: 133 mmol/L — ABNORMAL LOW (ref 135–145)
Total Bilirubin: 12.9 mg/dL — ABNORMAL HIGH (ref 0.3–1.2)
Total Protein: 4.1 g/dL — ABNORMAL LOW (ref 6.5–8.1)

## 2021-04-11 LAB — PREPARE RBC (CROSSMATCH)

## 2021-04-11 LAB — CBC
HCT: 20.9 % — ABNORMAL LOW (ref 36.0–46.0)
Hemoglobin: 6.9 g/dL — CL (ref 12.0–15.0)
MCH: 33 pg (ref 26.0–34.0)
MCHC: 33 g/dL (ref 30.0–36.0)
MCV: 100 fL (ref 80.0–100.0)
Platelets: 34 10*3/uL — ABNORMAL LOW (ref 150–400)
RBC: 2.09 MIL/uL — ABNORMAL LOW (ref 3.87–5.11)
RDW: 22.2 % — ABNORMAL HIGH (ref 11.5–15.5)
WBC: 4.3 10*3/uL (ref 4.0–10.5)
nRBC: 0 % (ref 0.0–0.2)

## 2021-04-11 LAB — MAGNESIUM: Magnesium: 1.5 mg/dL — ABNORMAL LOW (ref 1.7–2.4)

## 2021-04-11 LAB — AMMONIA: Ammonia: 74 umol/L — ABNORMAL HIGH (ref 9–35)

## 2021-04-11 LAB — PHOSPHORUS: Phosphorus: 2.3 mg/dL — ABNORMAL LOW (ref 2.5–4.6)

## 2021-04-11 MED ORDER — SODIUM CHLORIDE 0.9% IV SOLUTION: Freq: Once | INTRAVENOUS | Status: DC

## 2021-04-11 MED ORDER — FUROSEMIDE 10 MG/ML IJ SOLN
20.0000 mg | Freq: Once | INTRAMUSCULAR | Status: AC
  Administered 2021-04-11: 20 mg via INTRAVENOUS
  Filled 2021-04-11: qty 2

## 2021-04-11 MED ORDER — POTASSIUM PHOSPHATES 15 MMOLE/5ML IV SOLN
15.0000 mmol | Freq: Once | INTRAVENOUS | Status: AC
  Administered 2021-04-11: 15 mmol via INTRAVENOUS
  Filled 2021-04-11: qty 5

## 2021-04-11 MED ORDER — SODIUM CHLORIDE 0.9% IV SOLUTION: Freq: Once | INTRAVENOUS | Status: AC

## 2021-04-11 MED ORDER — MAGNESIUM SULFATE 4 GM/100ML IV SOLN
4.0000 g | Freq: Once | INTRAVENOUS | Status: AC
  Administered 2021-04-11: 4 g via INTRAVENOUS
  Filled 2021-04-11: qty 100

## 2021-04-11 MED ORDER — INSULIN GLARGINE-YFGN 100 UNIT/ML ~~LOC~~ SOLN
15.0000 [IU] | Freq: Every day | SUBCUTANEOUS | Status: DC
  Administered 2021-04-11 – 2021-04-14 (×4): 15 [IU] via SUBCUTANEOUS
  Filled 2021-04-11 (×5): qty 0.15

## 2021-04-11 NOTE — Progress Notes (Signed)
She denies PROGRESS NOTE    [name]  LHT:[id number] DOB: March 08, 1966 DOA: 04/05/2021 PCP: [facility]    Brief Narrative:  56 year old female with a history of cirrhosis, admitted to the hospital with confusion and lethargy.  Found to have sepsis from possible UTI.  She was also found to have positive blood cultures and is on antibiotics.  Ammonia is noted to be elevated she is on lactulose and Xifaxan.  She is also noted to be anemic and has been receiving PRBC transfusions.  For hypotension, she has required Levophed infusion which has since been weaned off.   Assessment & Plan:   Principal Problem:   Sepsis secondary to UTI [facility]) Active Problems:   Gastroesophageal reflux   Thrombocytopenia (HCC)   Alcoholic cirrhosis (HCC)   Hypoalbuminemia due to protein-calorie malnutrition (HCC)   Generalized weakness   Gastro-esophageal reflux disease without esophagitis   Macrocytic anemia   Adult hypothyroidism   Supratherapeutic INR   Hyponatremia   Hyperglycemia due to diabetes mellitus (HCC)   Severe sepsis -Possibly related to possible UTI.  She does have a history of ESBL E. Coli -Currently on meropenem, will follow-up sensitivities -She had 2 sets of blood cultures drawn.  3 out of 4 bottles positive for Staphylococcus simulans.  Discussed with ID Dr. Gale Journey who recommended repeating blood cultures. -Blood cultures repeated 1/21, currently no growth -She briefly required Levophed infusion, but has since been weaned off. -Lactic acid is trending down -she will need a total of 7 days of Meropenem for ESBL E coli  Symptomatic anemia -Noted to have hemoglobin of 7.8 on admission -She has also been transfused 2 units PRBC -Follow-up hemoglobin 8.4->7.2->7.5->6.9 -We will transfuse another unit of PRBC today -Continue to monitor CBC -Checking stool for occult blood -Staff reports oozing around her PICC line.  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  She does not have any right upper quadrant pain or vomiting. -We will continue to monitor for now -Restarted on IV albumin and Lasix/Aldactone -Ultrasound abdomen did not show a significant amount of ascites for therapeutic paracentesis  Hypothyroidism -Continue Synthroid  Insulin dependent diabetes mellitus -Followed [facility] at [facility] -She appears to be on an insulin pump as an outpatient. -Currently on sliding scale insulin -Currently on basal insulin, will increase dose since she is persistently hyperglycemic -added NovoLog for meal coverage  Chronic respiratory failure with hypoxia -Chronically on 2 to 3 L of supplemental oxygen    DVT prophylaxis: SCDs Start: 04/06/21 0834  Code Status: DNR Family Communication: At patient's request, I updated her significant other over the phone 1/18 Disposition Plan: Status is: Inpatient  Remains inpatient appropriate because: Continued IV antibiotics and management of encephalopathy.         Consultants:    Procedures:    Antimicrobials:  Meropenem 1/17 > Vancomycin 1/18 > 1/18   Subjective: Continues to feel sleepy today.  Started along with her oxygen is on, she does not feel short of breath.  She is unsure whether she has had bowel movements.  Objective: Vitals:   04/11/21 1102 04/11/21 1106 04/11/21 1407 04/11/21 1407  BP: Marland Kitchen)  127/51 (!) 127/51 135/60 135/60  Pulse: 79 79 78 79  Resp: 16 16 16 16   Temp: 98 F (36.7 C) 98 F (36.7 C) 98.2 F (36.8 C) 98.2  F (36.8 C)  TempSrc:  Oral Oral Oral  SpO2:  94%  97%  Weight:      Height:        Intake/Output Summary (Last 24 hours) at 04/11/2021 1900 Last data filed at 04/11/2021 1744 Gross per 24 hour  Intake 2056.32 ml  Output --  Net 2056.32 ml   Filed Weights   04/06/21 0833 04/07/21 0543  Weight: 62.3 kg 66 kg    Examination: General exam: Alert, awake, oriented x 3 Respiratory system: Clear to auscultation. Respiratory effort normal. Cardiovascular system:RRR. No murmurs, rubs, gallops. Gastrointestinal system: Abdomen is nondistended, soft and nontender. No organomegaly or masses felt. Normal bowel sounds heard. Central nervous system: Alert and oriented. No focal neurological deficits. Extremities: 1-2+ pitting edema bilaterally Skin: No rashes, lesions or ulcers Psychiatry: Judgement and insight appear normal. Mood & affect appropriate.        Data Reviewed: I have personally reviewed following labs and imaging studies  CBC: Recent Labs  Lab 04/05/21 2030 04/07/21 0450 04/08/21 0443 04/09/21 0352 04/10/21 0427 04/11/21 0544  WBC 10.4 6.2 6.2 4.2 4.6 4.3  NEUTROABS 8.9*  --   --   --   --   --   HGB 7.8* 6.5* 8.4* 7.2* 7.5* 6.9*  HCT 24.9* 19.7* 24.4* 21.9* 22.2* 20.9*  MCV 115.8* 107.7* 99.2 99.5 101.8* 100.0  PLT 54* 40* 40* 34* 32* 34*   Basic Metabolic Panel: Recent Labs  Lab 04/06/21 0855 04/07/21 0450 04/08/21 0443 04/09/21 0352 04/10/21 0427 04/11/21 0544  NA  --  129* 134* 133* 134* 133*  K  --  3.9 3.6 3.7 3.2* 3.4*  CL  --  97* 100 100 101 101  CO2  --  26 28 28 28 27   GLUCOSE  --  283* 227* 306* 271* 202*  BUN  --  29* 33* 26* 24* 22*  CREATININE  --  0.73 0.67 0.38* 0.33* <0.30*  CALCIUM  --  7.8* 8.4* 8.6* 8.4* 8.1*  MG 1.4* 1.8 1.8  --   --  1.5*  PHOS 4.2  --  2.3*  --   --  2.3*   GFR: CrCl cannot be calculated (This lab value cannot be used to calculate CrCl because it is not a number: <0.30). Liver Function Tests: Recent Labs   Lab 04/07/21 0450 04/08/21 0443 04/09/21 0352 04/10/21 0427 04/11/21 0544  AST 57* 58* 48* 46* 51*  ALT 27 24 21 17 19   ALKPHOS 67 82 73 76 84  BILITOT 17.7* 16.5* 14.1* 13.1* 12.9*  PROT 3.8* 4.3* 4.3* 4.2* 4.1*  ALBUMIN <1.5* 1.5* 1.8* 2.1* 2.1*   Recent Labs  Lab 04/06/21 0727  LIPASE 59*   Recent Labs  Lab 04/05/21 2030 04/07/21 0450 04/08/21 0443 04/10/21 1630 04/11/21 0544  AMMONIA 31 94* 46* 76* 74*   Coagulation Profile: Recent Labs  Lab 04/05/21 2231  INR 3.4*   Cardiac Enzymes: No results for input(s): CKTOTAL, CKMB, CKMBINDEX, TROPONINI in the last 168 hours. BNP (last 3 results) No results for input(s): PROBNP in the last 8760 hours. HbA1C: No results for input(s): HGBA1C in the last 72 hours. CBG: Recent Labs  Lab 04/10/21 1631 04/10/21 2304 04/11/21 0723 04/11/21 1103 04/11/21 1629  GLUCAP 243* 268* 174* 310* 364*   Lipid Profile:  No results for input(s): CHOL, HDL, LDLCALC, TRIG, CHOLHDL, LDLDIRECT in the last 72 hours. Thyroid Function Tests: No results for input(s): TSH, T4TOTAL, FREET4, T3FREE, THYROIDAB in the last 72 hours. Anemia Panel: No results for input(s): VITAMINB12, FOLATE, FERRITIN, TIBC, IRON, RETICCTPCT in the last 72 hours.  Sepsis Labs: Recent Labs  Lab 04/06/21 0030 04/06/21 0855 04/06/21 1040 04/07/21 0450  LATICACIDVEN 7.2* 6.0* 4.1* 2.8*    Recent Results (from the past 240 hour(s))  Urine Culture     Status: Abnormal   Collection Time: 04/05/21 12:36 AM   Specimen: Urine, Clean Catch  Result Value Ref Range Status   Specimen Description   Final    URINE, CLEAN CATCH Performed at [facility], [address] [place], [place] [address]    Special Requests   Final    NONE Performed at [facility], [address] [place],  [address]    Culture (A)  Final    50,000 COLONIES/mL ESCHERICHIA COLI Confirmed Extended Spectrum Beta-Lactamase Producer (ESBL).  In bloodstream infections from ESBL  organisms, carbapenems are preferred over piperacillin/tazobactam. They are shown to have a lower risk of mortality.    Report Status 04/08/2021 FINAL  Final   Organism ID, Bacteria ESCHERICHIA COLI (A)  Final      Susceptibility   Escherichia coli - MIC*    AMPICILLIN >=32 RESISTANT Resistant     CEFAZOLIN >=64 RESISTANT Resistant     CEFEPIME 16 RESISTANT Resistant     CEFTRIAXONE >=64 RESISTANT Resistant     CIPROFLOXACIN >=4 RESISTANT Resistant     GENTAMICIN <=1 SENSITIVE Sensitive     IMIPENEM <=0.25 SENSITIVE Sensitive     NITROFURANTOIN <=16 SENSITIVE Sensitive     TRIMETH/SULFA <=20 SENSITIVE Sensitive     AMPICILLIN/SULBACTAM >=32 RESISTANT Resistant     PIP/TAZO 32 INTERMEDIATE Intermediate     * 50,000 COLONIES/mL ESCHERICHIA COLI  Resp Panel by RT-PCR (Flu A&B, Covid) Nasopharyngeal Swab     Status: None   Collection Time: 04/05/21 10:15 PM   Specimen: Nasopharyngeal Swab; Nasopharyngeal(NP) swabs in vial transport medium  Result Value Ref Range Status   SARS Coronavirus 2 by RT PCR NEGATIVE NEGATIVE Final    Comment: (NOTE) SARS-CoV-2 target nucleic acids are NOT DETECTED.  The SARS-CoV-2 RNA is generally detectable in upper respiratory specimens during the acute phase of infection. The lowest concentration of SARS-CoV-2 viral copies this assay can detect is 138 copies/mL. A negative result does not preclude SARS-Cov-2 infection and should not be used as the sole basis for treatment or other patient management decisions. A negative result may occur with  improper specimen collection/handling, submission of specimen other than nasopharyngeal swab, presence of viral mutation(s) within the areas targeted by this assay, and inadequate number of viral copies(<138 copies/mL). A negative result must be combined with clinical observations, patient history, and epidemiological information. The expected result is Negative.  Fact Sheet for Patients:   EntrepreneurPulse.com.au  Fact Sheet for Healthcare Providers:  IncredibleEmployment.be  This test is no t yet approved or cleared by the [place] FDA and  has been authorized for detection and/or diagnosis of SARS-CoV-2 by FDA under an Emergency Use Authorization (EUA). This EUA will remain  in effect (meaning this test can be used) for the duration of the COVID-19 declaration under Section 564(b)(1) of the Act, 21 U.S.C.section 360bbb-3(b)(1), unless the authorization is terminated  or revoked sooner.       Influenza A by PCR NEGATIVE NEGATIVE Final  Influenza B by PCR NEGATIVE NEGATIVE Final    Comment: (NOTE) The Xpert Xpress SARS-CoV-2/FLU/RSV plus assay is intended as an aid in the diagnosis of influenza from Nasopharyngeal swab specimens and should not be used as a sole basis for treatment. Nasal washings and aspirates are unacceptable for Xpert Xpress SARS-CoV-2/FLU/RSV testing.  Fact Sheet for Patients: EntrepreneurPulse.com.au  Fact Sheet for Healthcare Providers: IncredibleEmployment.be  This test is not yet approved or cleared by the [place] FDA and has been authorized for detection and/or diagnosis of SARS-CoV-2 by FDA under an Emergency Use Authorization (EUA). This EUA will remain in effect (meaning this test can be used) for the duration of the COVID-19 declaration under Section 564(b)(1) of the Act, 21 U.S.C. section 360bbb-3(b)(1), unless the authorization is terminated or revoked.  Performed at [facility], [address], [place], [place] [address]   Blood Culture (routine x 2)     Status: Abnormal   Collection Time: 04/05/21 10:39 PM   Specimen: Left Antecubital; Blood  Result Value Ref Range Status   Specimen Description   Final    LEFT ANTECUBITAL Performed at [facility], [address] [place], [place] [address]    Special Requests   Final    BOTTLES DRAWN  AEROBIC AND ANAEROBIC Blood Culture adequate volume Performed at [facility], [address] [place], [place] [address]    Culture  Setup Time   Final    GRAM POSITIVE COCCI IN CLUSTERS IN BOTH AEROBIC AND ANAEROBIC BOTTLES CALL PREVIOUS TO ICU FOILEY, B. Organism ID to follow CRITICAL RESULT CALLED TO, READ BACK BY AND VERIFIED WITH: JESSICA HEARN RN 04/06/21 @2039  BY JW Performed at [facility], [place] [address] [place], [place] [address]    Culture STAPHYLOCOCCUS SIMULANS (A)  Final   Report Status 04/08/2021 FINAL  Final   Organism ID, Bacteria STAPHYLOCOCCUS SIMULANS  Final      Susceptibility   Staphylococcus simulans - MIC*    CIPROFLOXACIN <=0.5 SENSITIVE Sensitive     ERYTHROMYCIN >=8 RESISTANT Resistant     GENTAMICIN <=0.5 SENSITIVE Sensitive     OXACILLIN <=0.25 SENSITIVE Sensitive     TETRACYCLINE <=1 SENSITIVE Sensitive     VANCOMYCIN <=0.5 SENSITIVE Sensitive     TRIMETH/SULFA <=10 SENSITIVE Sensitive     CLINDAMYCIN >=8 RESISTANT Resistant     RIFAMPIN 16 RESISTANT Resistant     Inducible Clindamycin NEGATIVE Sensitive     * STAPHYLOCOCCUS SIMULANS  Blood Culture (routine x 2)     Status: Abnormal   Collection Time: 04/05/21 10:39 PM   Specimen: BLOOD RIGHT ARM  Result Value Ref Range Status   Specimen Description   Final    BLOOD RIGHT ARM Performed at [facility], [address] [place], [place] [address]    Special Requests   Final    BOTTLES DRAWN AEROBIC ONLY Blood Culture adequate volume Performed at [facility], [address], [place], [place] [address]    Culture  Setup Time   Final    GRAM POSITIVE COCCI IN CLUSTERS AEROBIC BOTTLE ONLY  RESULTS CALLED AND READ BACK FROM FOILY, B AT 1400 ON 04/06/21 BY BROWNING, D.    Culture (A)  Final    STAPHYLOCOCCUS SIMULANS SUSCEPTIBILITIES PERFORMED ON PREVIOUS CULTURE WITHIN THE LAST 5 DAYS. Performed at [facility], [place] [address], [place], [place] [address]    Report Status 04/08/2021  FINAL  Final  Blood Culture ID Panel (Reflexed)     Status:  Abnormal   Collection Time: 04/05/21 10:39 PM  Result Value Ref Range Status   Enterococcus faecalis NOT DETECTED NOT DETECTED Final   Enterococcus Faecium NOT DETECTED NOT DETECTED Final   Listeria monocytogenes NOT DETECTED NOT DETECTED Final   Staphylococcus species DETECTED (A) NOT DETECTED Final    Comment: CRITICAL RESULT CALLED TO, READ BACK BY AND VERIFIED WITH: JESSICA HEARN RN 04/06/21 @2039  BY JW    Staphylococcus aureus (BCID) NOT DETECTED NOT DETECTED Final   Staphylococcus epidermidis NOT DETECTED NOT DETECTED Final   Staphylococcus lugdunensis NOT DETECTED NOT DETECTED Final   Streptococcus species NOT DETECTED NOT DETECTED Final   Streptococcus agalactiae NOT DETECTED NOT DETECTED Final   Streptococcus pneumoniae NOT DETECTED NOT DETECTED Final   Streptococcus pyogenes NOT DETECTED NOT DETECTED Final   A.calcoaceticus-baumannii NOT DETECTED NOT DETECTED Final   Bacteroides fragilis NOT DETECTED NOT DETECTED Final   Enterobacterales NOT DETECTED NOT DETECTED Final   Enterobacter cloacae complex NOT DETECTED NOT DETECTED Final   Escherichia coli NOT DETECTED NOT DETECTED Final   Klebsiella aerogenes NOT DETECTED NOT DETECTED Final   Klebsiella oxytoca NOT DETECTED NOT DETECTED Final   Klebsiella pneumoniae NOT DETECTED NOT DETECTED Final   Proteus species NOT DETECTED NOT DETECTED Final   Salmonella species NOT DETECTED NOT DETECTED Final   Serratia marcescens NOT DETECTED NOT DETECTED Final   Haemophilus influenzae NOT DETECTED NOT DETECTED Final   Neisseria meningitidis NOT DETECTED NOT DETECTED Final   Pseudomonas aeruginosa NOT DETECTED NOT DETECTED Final   Stenotrophomonas maltophilia NOT DETECTED NOT DETECTED Final   Candida albicans NOT DETECTED NOT DETECTED Final   Candida auris NOT DETECTED NOT DETECTED Final   Candida glabrata NOT DETECTED NOT DETECTED Final   Candida krusei NOT DETECTED NOT  DETECTED Final   Candida parapsilosis NOT DETECTED NOT DETECTED Final   Candida tropicalis NOT DETECTED NOT DETECTED Final   Cryptococcus neoformans/gattii NOT DETECTED NOT DETECTED Final    Comment: Performed at [facility], [address], [place], [place] [address]  MRSA Next Gen by PCR, Nasal     Status: None   Collection Time: 04/06/21  8:45 AM   Specimen: Nasal Mucosa; Nasal Swab  Result Value Ref Range Status   MRSA by PCR Next Gen NOT DETECTED NOT DETECTED Final    Comment: (NOTE) The GeneXpert MRSA Assay (FDA approved for NASAL specimens only), is one component of a comprehensive MRSA colonization surveillance program. It is not intended to diagnose MRSA infection nor to guide or monitor treatment for MRSA infections. Test performance is not FDA approved in patients less than 68 years old. Performed at [facility], [address], [place], [place] [address]   Culture, blood (routine x 2)     Status: None (Preliminary result)   Collection Time: 04/10/21  4:58 PM   Specimen: Left Antecubital; Blood  Result Value Ref Range Status   Specimen Description   Final    LEFT ANTECUBITAL BOTTLES DRAWN AEROBIC AND ANAEROBIC   Special Requests   Final    Blood Culture results may not be optimal due to an excessive volume of blood received in culture bottles   Culture   Final    NO GROWTH < 24 HOURS Performed at [facility], [address], [place], [place] [address]    Report Status PENDING  Incomplete  Culture, blood (routine x 2)     Status: None (Preliminary result)   Collection Time: 04/10/21  5:09 PM  Specimen: BLOOD LEFT HAND  Result Value Ref Range Status   Specimen Description   Final    BLOOD LEFT HAND BOTTLES DRAWN AEROBIC AND ANAEROBIC   Special Requests   Final    Blood Culture results may not be optimal due to an excessive volume of blood received in culture bottles   Culture   Final    NO GROWTH < 24 HOURS Performed at [facility], [address],  [place], [place] [address]    Report Status PENDING  Incomplete         Radiology Studies: DG CHEST PORT 1 VIEW  Result Date: 04/11/2021 CLINICAL DATA:  Patient pulled  PICC out EXAM: PORTABLE CHEST 1 VIEW COMPARISON:  04/06/2021 FINDINGS: Right arm PICC tip at the cavoatrial junction unchanged from the prior study. Progression of patchy right lower lobe infiltrate. Small right pleural effusion as progressed. No change in left lower lobe infiltrate. Mild cardiac enlargement.  Negative for heart failure or edema. IMPRESSION: PICC tip in the SVC at the cavoatrial junction unchanged from prior study Progression of right lower lobe infiltrate and effusion possible pneumonia. No change left lower lobe infiltrate. Electronically Signed   By: Franchot Gallo M.D.   On: 04/11/2021 13:19        Scheduled Meds:  sodium chloride   Intravenous Once   Chlorhexidine Gluconate Cloth  6 each Topical Daily   feeding supplement (GLUCERNA SHAKE)  237 mL Oral TID BM   insulin aspart  0-5 Units Subcutaneous QHS   insulin aspart  0-9 Units Subcutaneous TID WC   insulin aspart  4 Units Subcutaneous TID WC   insulin glargine-yfgn  10 Units Subcutaneous QHS   lactulose  30 g Oral TID   levothyroxine  50 mcg Oral QAC breakfast   lipase/protease/amylase  48,000 Units Oral TID AC   rifaximin  550 mg Oral BID   sodium chloride flush  10-40 mL Intracatheter Q12H   spironolactone  25 mg Oral Daily   Continuous Infusions:  meropenem (MERREM) IV 1 g (04/11/21 1656)     LOS: 5 days    Time spent: 61mns    JKathie Dike MD [facility]   If 7PM-7AM, please contact night-coverage [email or web address]  04/11/2021, 7:00 PM

## 2021-04-12 LAB — COMPREHENSIVE METABOLIC PANEL
ALT: 21 U/L (ref 0–44)
AST: 57 U/L — ABNORMAL HIGH (ref 15–41)
Albumin: 2.3 g/dL — ABNORMAL LOW (ref 3.5–5.0)
Alkaline Phosphatase: 87 U/L (ref 38–126)
Anion gap: 5 (ref 5–15)
BUN: 17 mg/dL (ref 6–20)
CO2: 28 mmol/L (ref 22–32)
Calcium: 8.1 mg/dL — ABNORMAL LOW (ref 8.9–10.3)
Chloride: 99 mmol/L (ref 98–111)
Creatinine, Ser: <0.3 mg/dL — ABNORMAL LOW (ref 0.44–1.00)
Glucose, Bld: 246 mg/dL — ABNORMAL HIGH (ref 70–99)
Potassium: 3.6 mmol/L (ref 3.5–5.1)
Sodium: 132 mmol/L — ABNORMAL LOW (ref 135–145)
Total Bilirubin: 16 mg/dL — ABNORMAL HIGH (ref 0.3–1.2)
Total Protein: 4.5 g/dL — ABNORMAL LOW (ref 6.5–8.1)

## 2021-04-12 LAB — GLUCOSE, CAPILLARY
Glucose-Capillary: 242 mg/dL — ABNORMAL HIGH (ref 70–99)
Glucose-Capillary: 210 mg/dL — ABNORMAL HIGH (ref 70–99)
Glucose-Capillary: 262 mg/dL — ABNORMAL HIGH (ref 70–99)
Glucose-Capillary: 216 mg/dL — ABNORMAL HIGH (ref 70–99)

## 2021-04-12 LAB — CBC
HCT: 25 % — ABNORMAL LOW (ref 36.0–46.0)
Hemoglobin: 8.4 g/dL — ABNORMAL LOW (ref 12.0–15.0)
MCH: 32.8 pg (ref 26.0–34.0)
MCHC: 33.6 g/dL (ref 30.0–36.0)
MCV: 97.7 fL (ref 80.0–100.0)
Platelets: 45 10*3/uL — ABNORMAL LOW (ref 150–400)
RBC: 2.56 MIL/uL — ABNORMAL LOW (ref 3.87–5.11)
RDW: 23.9 % — ABNORMAL HIGH (ref 11.5–15.5)
WBC: 4.1 10*3/uL (ref 4.0–10.5)
nRBC: 0 % (ref 0.0–0.2)

## 2021-04-12 LAB — PREPARE PLATELET PHERESIS: Unit division: 0

## 2021-04-12 LAB — BPAM PLATELET PHERESIS
Blood Product Expiration Date: 202301252359
ISSUE DATE / TIME: 202301221836
Unit Type and Rh: 7300

## 2021-04-12 LAB — MAGNESIUM: Magnesium: 1.7 mg/dL (ref 1.7–2.4)

## 2021-04-12 LAB — PROTIME-INR
INR: 2.6 — ABNORMAL HIGH (ref 0.8–1.2)
Prothrombin Time: 27.5 seconds — ABNORMAL HIGH (ref 11.4–15.2)

## 2021-04-12 LAB — PHOSPHORUS: Phosphorus: 3.2 mg/dL (ref 2.5–4.6)

## 2021-04-12 MED ORDER — VITAMIN K1 10 MG/ML IJ SOLN
10.0000 mg | Freq: Once | INTRAMUSCULAR | Status: AC
  Administered 2021-04-12: 10 mg via INTRAVENOUS
  Filled 2021-04-12: qty 1

## 2021-04-12 MED ORDER — ALBUMIN HUMAN 25 % IV SOLN
25.0000 g | Freq: Two times a day (BID) | INTRAVENOUS | Status: DC
  Administered 2021-04-13 – 2021-04-15 (×6): 25 g via INTRAVENOUS
  Filled 2021-04-12 (×6): qty 100

## 2021-04-12 MED ORDER — FUROSEMIDE 10 MG/ML IJ SOLN
20.0000 mg | Freq: Two times a day (BID) | INTRAMUSCULAR | Status: DC
  Administered 2021-04-13 – 2021-04-14 (×3): 20 mg via INTRAVENOUS
  Filled 2021-04-12 (×3): qty 2

## 2021-04-12 MED ORDER — VITAMIN K1 10 MG/ML IJ SOLN
INTRAMUSCULAR | Status: AC
  Filled 2021-04-12: qty 1

## 2021-04-12 NOTE — Evaluation (Signed)
Occupational Therapy Evaluation Patient Details Name: [name] MRN: [id number] DOB: 01-01-1966 Today's Date: 04/12/2021   History of Present Illness [name] is a 56 y.o. female with medical history significant for NASH cirrhosis, type 2 diabetes mellitus, hypothyroidism, thrombocytopenia, GERD who presents to the emergency department via EMS due to several day history of lethargy and confusion.  Patient states that she has been sleeping a lot since Sunday (2 days ago) and not taking her medications including lactulose, she states that she was without energy and that she has not been eating either.  She complained of some abdominal pain due to abdominal distention.  Patient states that she was recently diagnosed with UTI at [facility] and was prescribed with Macrobid which she was not sure if she was taking since 2 days ago when she was so weak and sleepy.  Her significant other was concerned of her lethargy and confusion, so he insisted that patient should go to the ED for further evaluation and management.  She denies chest pain, shortness of breath, fever, chills, nausea, vomiting   Clinical Impression   Patient in bed upon therapy arrival and agreeable to participate in OT evaluation. Patient is presenting with generalized weakness and overall decreased activity tolerance due to medical issues. She required increased time to complete basic ADL tasks. Recommend Home health OT upon discharge to focus on mentioned deficits. OT will follow patient acutely.      Recommendations for follow up therapy are one component of a multi-disciplinary discharge planning process, led by the attending physician.  Recommendations may be updated based on patient status, additional functional criteria and insurance authorization.   Follow Up Recommendations  Home health OT    Assistance Recommended at Discharge Intermittent Supervision/Assistance  Patient can return home with the following A  little help with walking and/or transfers;A little help with bathing/dressing/bathroom;Assist for transportation;Help with stairs or ramp for entrance    Functional Status Assessment  Patient has had a recent decline in their functional status and demonstrates the ability to make significant improvements in function in a reasonable and predictable amount of time.  Equipment Recommendations  BSC/3in1    Recommendations for Other Services       Precautions / Restrictions Precautions Precautions: Fall Restrictions Weight Bearing Restrictions: No      Mobility Bed Mobility Overal bed mobility: Modified Independent Bed Mobility: Supine to Sit     Supine to sit: Modified independent (Device/Increase time)          Transfers Overall transfer level: Needs assistance Equipment used: Rolling walker (2 wheels) Transfers: Sit to/from Stand, Bed to chair/wheelchair/BSC Sit to Stand: Supervision, From elevated surface     Step pivot transfers: Supervision            Balance Overall balance assessment: No apparent balance deficits (not formally assessed)       ADL either performed or assessed with clinical judgement   ADL Overall ADL's : Needs assistance/impaired Eating/Feeding: Set up;Sitting   Grooming: Wash/dry face;Wash/dry hands;Set up;Bed level   Upper Body Bathing: Supervision/ safety;Sitting   Lower Body Bathing: Minimal assistance;Sitting/lateral leans;Sit to/from stand   Upper Body Dressing : Supervision/safety;Sitting   Lower Body Dressing: Minimal assistance;Sitting/lateral leans;Sit to/from stand   Toilet Transfer: Supervision/safety;Rolling walker (2 wheels);BSC/3in1           Vision Baseline Vision/History: 0 No visual deficits Patient Visual Report: No change from baseline              Pertinent Vitals/Pain  Pain Assessment Pain Assessment: 0-10 Pain Score: 6  Pain Location: stomach and legs Pain Descriptors / Indicators: Aching,  Grimacing, Discomfort Pain Intervention(s): Limited activity within patient's tolerance, Monitored during session     Hand Dominance Right   Extremity/Trunk Assessment Upper Extremity Assessment Upper Extremity Assessment: Generalized weakness   Lower Extremity Assessment Lower Extremity Assessment: Defer to PT evaluation       Communication Communication Communication: No difficulties   Cognition Arousal/Alertness: Awake/alert Behavior During Therapy: WFL for tasks assessed/performed Overall Cognitive Status: Within Functional Limits for tasks assessed                    Home Living Family/patient expects to be discharged to:: Private residence Living Arrangements: Spouse/significant other Available Help at Discharge: Family;Available PRN/intermittently Type of Home: House Home Access: Ramped entrance     Home Layout: One level     Bathroom Shower/Tub: Chief Strategy Officer: Handicapped height Bathroom Accessibility: Yes   Home Equipment: Agricultural consultant (2 wheels);Rollator (4 wheels);Cane - single point;Wheelchair - manual;Grab bars - toilet;Grab bars - tub/shower          Prior Functioning/Environment Prior Level of Function : Independent/Modified Independent;Needs assist       Physical Assist : Mobility (physical);ADLs (physical)   ADLs (physical): Bathing;Dressing;Toileting   ADLs Comments: patient reports that she will occassionally require assistance to complete ADL tasks.        OT Problem List: Decreased strength;Decreased activity tolerance      OT Treatment/Interventions: Self-care/ADL training;Therapeutic activities;Therapeutic exercise;Neuromuscular education;Energy conservation;DME and/or AE instruction;Patient/family education;Manual therapy;Modalities;Balance training    OT Goals(Current goals can be found in the care plan section) Acute Rehab OT Goals Patient Stated Goal: to get stronger. OT Goal Formulation: With  patient Time For Goal Achievement: 04/26/21 Potential to Achieve Goals: Good  OT Frequency: Min 2X/week       AM-PAC OT "6 Clicks" Daily Activity     Outcome Measure Help from another person eating meals?: None Help from another person taking care of personal grooming?: A Little Help from another person toileting, which includes using toliet, bedpan, or urinal?: A Little Help from another person bathing (including washing, rinsing, drying)?: A Little Help from another person to put on and taking off regular upper body clothing?: A Little Help from another person to put on and taking off regular lower body clothing?: A Little 6 Click Score: 19   End of Session Equipment Utilized During Treatment: Rolling walker (2 wheels);Oxygen  Activity Tolerance: Patient tolerated treatment well;Patient limited by fatigue Patient left: in chair;with call bell/phone within reach;with family/visitor present  OT Visit Diagnosis: Muscle weakness (generalized) (M62.81)                Time: 0935-1010 OT Time Calculation (min): 35 min Charges:  OT General Charges $OT Visit: 1 Visit OT Evaluation $OT Eval Low Complexity: 1 Low  Limmie Patricia, OTR/L,CBIS  [phone]   Reighlynn Swiney, Charisse March 04/12/2021, 11:00 AM

## 2021-04-12 NOTE — Plan of Care (Signed)
°  Problem: Acute Rehab OT Goals (only OT should resolve) Goal: Pt. Will Perform Eating Flowsheets (Taken 04/12/2021 1119) Pt Will Perform Eating:  with modified independence  sitting Goal: Pt. Will Perform Grooming Flowsheets (Taken 04/12/2021 1119) Pt Will Perform Grooming:  with modified independence  sitting Goal: Pt. Will Perform Upper Body Bathing Flowsheets (Taken 04/12/2021 1119) Pt Will Perform Upper Body Bathing:  with modified independence  sitting Goal: Pt. Will Perform Lower Body Bathing Flowsheets (Taken 04/12/2021 1119) Pt Will Perform Lower Body Bathing:  with supervision  sitting/lateral leans  sit to/from stand  bed level Goal: Pt. Will Perform Upper Body Dressing Flowsheets (Taken 04/12/2021 1119) Pt Will Perform Upper Body Dressing:  with modified independence  sitting Goal: Pt. Will Perform Lower Body Dressing Flowsheets (Taken 04/12/2021 1119) Pt Will Perform Lower Body Dressing:  with supervision  sit to/from stand  sitting/lateral leans  bed level Goal: Pt. Will Transfer To Toilet Flowsheets (Taken 04/12/2021 1119) Pt Will Transfer to Toilet:  with modified independence  bedside commode  stand pivot transfer  ambulating Goal: Pt. Will Perform Toileting-Clothing Manipulation Flowsheets (Taken 04/12/2021 1119) Pt Will Perform Toileting - Clothing Manipulation and hygiene:  with modified independence  sitting/lateral leans  sit to/from stand Goal: Pt/Caregiver Will Perform Home Exercise Program Flowsheets (Taken 04/12/2021 1119) Pt/caregiver will Perform Home Exercise Program:  Increased strength  Both right and left upper extremity  With written HEP provided

## 2021-04-12 NOTE — Progress Notes (Addendum)
She denies PROGRESS NOTE    [name]  MWU:[id number] DOB: 08/09/1965 DOA: 04/05/2021 PCP: [facility]    Brief Narrative:  56 year old female with a history of cirrhosis, admitted to the hospital with confusion and lethargy.  Found to have sepsis from possible UTI.  She was also found to have positive blood cultures and is on antibiotics.  Ammonia is noted to be elevated she is on lactulose and Xifaxan.  She is also noted to be anemic and has been receiving PRBC transfusions.  For hypotension, she has required Levophed infusion which has since been weaned off.   Assessment & Plan:   Principal Problem:   Sepsis secondary to UTI [facility]) Active Problems:   Gastroesophageal reflux   Thrombocytopenia (HCC)   Alcoholic cirrhosis (HCC)   Hypoalbuminemia due to protein-calorie malnutrition (HCC)   Generalized weakness   Gastro-esophageal reflux disease without esophagitis   Macrocytic anemia   Adult hypothyroidism   Supratherapeutic INR   Hyponatremia   Hyperglycemia due to diabetes mellitus (HCC)   Severe sepsis -Possibly related to possible UTI.  She does have a history of ESBL E. Coli -Currently on meropenem -She had 2 sets of blood cultures drawn on admission.  3 out of 4 bottles positive for Staphylococcus simulans.  Discussed with ID Dr. Gale Journey who recommended repeating blood cultures.  If repeat cultures are negative, could consider positive culture is a contaminant -Blood cultures repeated 1/21, currently no growth -She briefly required Levophed infusion, but has since been weaned off. -Lactic acid is trending down -she will need a total of 7 days of Meropenem for ESBL E coli which should be completed on 1/23  Symptomatic anemia -Noted to have hemoglobin of 7.8 on admission -She has also been transfused a total of 3 units of PRBC thus far -Follow-up hemoglobin 8.4->7.2->7.5->6.9->8.4 -Continue to monitor CBC -Checking stool for occult blood -Staff reports  oozing around her PICC line.  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  She does not have any right upper quadrant pain or vomiting. -We will continue to monitor for now -Restarted on IV albumin and Lasix/Aldactone -Ultrasound abdomen did not show a significant amount of ascites for therapeutic paracentesis  Hypothyroidism -Continue Synthroid  Insulin dependent diabetes mellitus -Followed by endocrinology at [facility] -She appears to be on an insulin pump as an outpatient. -Currently on sliding scale insulin -Currently on basal insulin, will increase dose since she is persistently hyperglycemic -added NovoLog for meal coverage  Chronic respiratory failure with hypoxia -Chronically on 2 to 3 L of supplemental oxygen  Moderate Malnutrition -nutrition services following    DVT prophylaxis: SCDs Start: 04/06/21 0834  Code Status: DNR Family Communication: At patient's request, I updated her significant other over the phone 1/18 Disposition Plan: Status is: Inpatient  Remains inpatient appropriate because: Continued IV antibiotics and management of encephalopathy.         Consultants:    Procedures:    Antimicrobials:  Meropenem 1/17 > Vancomycin 1/18 > 1/18   Subjective: She is sitting up in the chair today.  Feels mildly better.  Continues complain of swelling in her extremities.  Objective: Vitals:   04/11/21 1904 04/11/21 2132 04/12/21 0523 04/12/21 1454  BP: 133/76 (!) 132/59 112/64 (!) 143/55   Pulse: 80 88 75 72  Resp: 19 19 19 18   Temp: 98.3 F (36.8 C) 98.7 F (37.1 C) 99.4 F (37.4 C) 98.8 F (37.1 C)  TempSrc: Oral Oral Oral Oral  SpO2: 98% (!) 87% 92% 94%  Weight:      Height:        Intake/Output Summary (Last 24 hours) at 04/12/2021 1935 Last data filed at 04/12/2021 1357 Gross per 24 hour  Intake 110 ml  Output --  Net 110 ml   Filed Weights   04/06/21 0833 04/07/21 0543  Weight: 62.3 kg 66 kg    Examination: General exam: Alert, awake, oriented x 3 Respiratory system: Clear to auscultation. Respiratory effort normal. Cardiovascular system:RRR. No murmurs, rubs, gallops. Gastrointestinal system: Abdomen is nondistended, soft and nontender. No organomegaly or masses felt. Normal bowel sounds heard. Central nervous system: Alert and oriented. No focal neurological deficits. Extremities: 1-2+ pitting edema bilaterally Skin: No rashes, lesions or ulcers Psychiatry: Judgement and insight appear normal. Mood & affect appropriate.    Data Reviewed: I have personally reviewed following labs and imaging studies  CBC: Recent Labs  Lab 04/05/21 2030 04/07/21 0450 04/08/21 0443 04/09/21 0352 04/10/21 0427 04/11/21 0544 04/12/21 0424  WBC 10.4   < > 6.2 4.2 4.6 4.3 4.1  NEUTROABS 8.9*  --   --   --   --   --   --   HGB 7.8*   < > 8.4* 7.2* 7.5* 6.9* 8.4*  HCT 24.9*   < > 24.4* 21.9* 22.2* 20.9* 25.0*  MCV 115.8*   < > 99.2 99.5 101.8* 100.0 97.7  PLT 54*   < > 40* 34* 32* 34* 45*   < > = values in this interval not displayed.   Basic Metabolic Panel: Recent Labs  Lab 04/06/21 0855 04/07/21 0450 04/08/21 0443 04/09/21 0352 04/10/21 0427 04/11/21 0544 04/12/21 0424  NA  --  129* 134* 133* 134* 133* 132*  K  --  3.9 3.6 3.7 3.2* 3.4* 3.6  CL  --  97* 100 100 101 101 99  CO2  --  26 28 28 28 27 28   GLUCOSE  --  283* 227* 306* 271* 202* 246*  BUN  --  29* 33* 26* 24* 22* 17  CREATININE  --  0.73 0.67 0.38* 0.33* <0.30* <0.30*  CALCIUM  --   7.8* 8.4* 8.6* 8.4* 8.1* 8.1*  MG 1.4* 1.8 1.8  --   --  1.5* 1.7  PHOS 4.2  --  2.3*  --   --  2.3* 3.2   GFR: CrCl cannot be calculated (This lab value cannot be used to calculate CrCl because it is not a number: <0.30). Liver Function Tests: Recent Labs  Lab 04/08/21 0443 04/09/21 0352 04/10/21 0427 04/11/21 0544 04/12/21 0424  AST 58* 48* 46* 51* 57*  ALT 24 21 17 19 21   ALKPHOS 82 73 76 84 87  BILITOT 16.5* 14.1* 13.1* 12.9* 16.0*  PROT 4.3* 4.3* 4.2* 4.1* 4.5*  ALBUMIN 1.5* 1.8* 2.1* 2.1* 2.3*   Recent Labs  Lab 04/06/21 0727  LIPASE 59*   Recent Labs  Lab 04/05/21 2030 04/07/21 0450 04/08/21 0443 04/10/21 1630 04/11/21 0544  AMMONIA 31 94* 46* 76* 74*   Coagulation Profile: Recent Labs  Lab 04/05/21 2231 04/12/21 0424  INR 3.4* 2.6*   Cardiac Enzymes: No  results for input(s): CKTOTAL, CKMB, CKMBINDEX, TROPONINI in the last 168 hours. BNP (last 3 results) No results for input(s): PROBNP in the last 8760 hours. HbA1C: No results for input(s): HGBA1C in the last 72 hours. CBG: Recent Labs  Lab 04/11/21 1629 04/11/21 2149 04/12/21 0754 04/12/21 1146 04/12/21 1604  GLUCAP 364* 295* 242* 210* 262*   Lipid Profile: No results for input(s): CHOL, HDL, LDLCALC, TRIG, CHOLHDL, LDLDIRECT in the last 72 hours. Thyroid Function Tests: No results for input(s): TSH, T4TOTAL, FREET4, T3FREE, THYROIDAB in the last 72 hours. Anemia Panel: No results for input(s): VITAMINB12, FOLATE, FERRITIN, TIBC, IRON, RETICCTPCT in the last 72 hours.  Sepsis Labs: Recent Labs  Lab 04/06/21 0030 04/06/21 0855 04/06/21 1040 04/07/21 0450  LATICACIDVEN 7.2* 6.0* 4.1* 2.8*    Recent Results (from the past 240 hour(s))  Urine Culture     Status: Abnormal   Collection Time: 04/05/21 12:36 AM   Specimen: Urine, Clean Catch  Result Value Ref Range Status   Specimen Description   Final    URINE, CLEAN CATCH Performed at [facility], [address], [place], [place]  [address]    Special Requests   Final    NONE Performed at [facility], [address] [place], [place] [address]    Culture (A)  Final    50,000 COLONIES/mL ESCHERICHIA COLI Confirmed Extended Spectrum Beta-Lactamase Producer (ESBL).  In bloodstream infections from ESBL organisms, carbapenems are preferred over piperacillin/tazobactam. They are shown to have a lower risk of mortality.    Report Status 04/08/2021 FINAL  Final   Organism ID, Bacteria ESCHERICHIA COLI (A)  Final      Susceptibility   Escherichia coli - MIC*    AMPICILLIN >=32 RESISTANT Resistant     CEFAZOLIN >=64 RESISTANT Resistant     CEFEPIME 16 RESISTANT Resistant     CEFTRIAXONE >=64 RESISTANT Resistant     CIPROFLOXACIN >=4 RESISTANT Resistant     GENTAMICIN <=1 SENSITIVE Sensitive     IMIPENEM <=0.25 SENSITIVE Sensitive     NITROFURANTOIN <=16 SENSITIVE Sensitive     TRIMETH/SULFA <=20 SENSITIVE Sensitive     AMPICILLIN/SULBACTAM >=32 RESISTANT Resistant     PIP/TAZO 32 INTERMEDIATE Intermediate     * 50,000 COLONIES/mL ESCHERICHIA COLI  Resp Panel by RT-PCR (Flu A&B, Covid) Nasopharyngeal Swab     Status: None   Collection Time: 04/05/21 10:15 PM   Specimen: Nasopharyngeal Swab; Nasopharyngeal(NP) swabs in vial transport medium  Result Value Ref Range Status   SARS Coronavirus 2 by RT PCR NEGATIVE NEGATIVE Final    Comment: (NOTE) SARS-CoV-2 target nucleic acids are NOT DETECTED.  The SARS-CoV-2 RNA is generally detectable in upper respiratory specimens during the acute phase of infection. The lowest concentration of SARS-CoV-2 viral copies this assay can detect is 138 copies/mL. A negative result does not preclude SARS-Cov-2 infection and should not be used as the sole basis for treatment or other patient management decisions. A negative result may occur with  improper specimen collection/handling, submission of specimen other than nasopharyngeal swab, presence of viral mutation(s) within the areas  targeted by this assay, and inadequate number of viral copies(<138 copies/mL). A negative result must be combined with clinical observations, patient history, and epidemiological information. The expected result is Negative.  Fact Sheet for Patients:  EntrepreneurPulse.com.au  Fact Sheet for Healthcare Providers:  IncredibleEmployment.be  This test is no t yet approved or cleared by the [place] FDA and  has been authorized for detection and/or diagnosis  of SARS-CoV-2 by FDA under an Emergency Use Authorization (EUA). This EUA will remain  in effect (meaning this test can be used) for the duration of the COVID-19 declaration under Section 564(b)(1) of the Act, 21 U.S.C.section 360bbb-3(b)(1), unless the authorization is terminated  or revoked sooner.       Influenza A by PCR NEGATIVE NEGATIVE Final   Influenza B by PCR NEGATIVE NEGATIVE Final    Comment: (NOTE) The Xpert Xpress SARS-CoV-2/FLU/RSV plus assay is intended as an aid in the diagnosis of influenza from Nasopharyngeal swab specimens and should not be used as a sole basis for treatment. Nasal washings and aspirates are unacceptable for Xpert Xpress SARS-CoV-2/FLU/RSV testing.  Fact Sheet for Patients: EntrepreneurPulse.com.au  Fact Sheet for Healthcare Providers: IncredibleEmployment.be  This test is not yet approved or cleared by the [place] FDA and has been authorized for detection and/or diagnosis of SARS-CoV-2 by FDA under an Emergency Use Authorization (EUA). This EUA will remain in effect (meaning this test can be used) for the duration of the COVID-19 declaration under Section 564(b)(1) of the Act, 21 U.S.C. section 360bbb-3(b)(1), unless the authorization is terminated or revoked.  Performed at [facility], [address], [place], [place] [address]   Blood Culture (routine x 2)     Status: Abnormal   Collection Time:  04/05/21 10:39 PM   Specimen: Left Antecubital; Blood  Result Value Ref Range Status   Specimen Description   Final    LEFT ANTECUBITAL Performed at [facility], [address] [place], [place] [address]    Special Requests   Final    BOTTLES DRAWN AEROBIC AND ANAEROBIC Blood Culture adequate volume Performed at [facility], [address] [place], [place] [address]    Culture  Setup Time   Final    GRAM POSITIVE COCCI IN CLUSTERS IN BOTH AEROBIC AND ANAEROBIC BOTTLES CALL PREVIOUS TO ICU FOILEY, B. Organism ID to follow CRITICAL RESULT CALLED TO, READ BACK BY AND VERIFIED WITH: JESSICA HEARN RN 04/06/21 @2039  BY JW Performed at [facility], [place] [address] [place], [place] [address]    Culture STAPHYLOCOCCUS SIMULANS (A)  Final   Report Status 04/08/2021 FINAL  Final   Organism ID, Bacteria STAPHYLOCOCCUS SIMULANS  Final      Susceptibility   Staphylococcus simulans - MIC*    CIPROFLOXACIN <=0.5 SENSITIVE Sensitive     ERYTHROMYCIN >=8 RESISTANT Resistant     GENTAMICIN <=0.5 SENSITIVE Sensitive     OXACILLIN <=0.25 SENSITIVE Sensitive     TETRACYCLINE <=1 SENSITIVE Sensitive     VANCOMYCIN <=0.5 SENSITIVE Sensitive     TRIMETH/SULFA <=10 SENSITIVE Sensitive     CLINDAMYCIN >=8 RESISTANT Resistant     RIFAMPIN 16 RESISTANT Resistant     Inducible Clindamycin NEGATIVE Sensitive     * STAPHYLOCOCCUS SIMULANS  Blood Culture (routine x 2)     Status: Abnormal   Collection Time: 04/05/21 10:39 PM   Specimen: BLOOD RIGHT ARM  Result Value Ref Range Status   Specimen Description   Final    BLOOD RIGHT ARM Performed at [facility], [address] [place], [place] [address]    Special Requests   Final    BOTTLES DRAWN AEROBIC ONLY Blood Culture adequate volume Performed at [facility], [address] [place], [place] [address]    Culture  Setup Time   Final    GRAM POSITIVE COCCI IN CLUSTERS AEROBIC BOTTLE ONLY  RESULTS CALLED AND READ BACK FROM [place], B  AT 1400 ON  04/06/21 BY BROWNING, D.    Culture (A)  Final    STAPHYLOCOCCUS SIMULANS SUSCEPTIBILITIES PERFORMED ON PREVIOUS CULTURE WITHIN THE LAST 5 DAYS. Performed at [facility], [place] [address] [place], [place] [address]    Report Status 04/08/2021 FINAL  Final  Blood Culture ID Panel (Reflexed)     Status: Abnormal   Collection Time: 04/05/21 10:39 PM  Result Value Ref Range Status   Enterococcus faecalis NOT DETECTED NOT DETECTED Final   Enterococcus Faecium NOT DETECTED NOT DETECTED Final   Listeria monocytogenes NOT DETECTED NOT DETECTED Final   Staphylococcus species DETECTED (A) NOT DETECTED Final    Comment: CRITICAL RESULT CALLED TO, READ BACK BY AND VERIFIED WITH: JESSICA HEARN RN 04/06/21 @2039  BY JW    Staphylococcus aureus (BCID) NOT DETECTED NOT DETECTED Final   Staphylococcus epidermidis NOT DETECTED NOT DETECTED Final   Staphylococcus lugdunensis NOT DETECTED NOT DETECTED Final   Streptococcus species NOT DETECTED NOT DETECTED Final   Streptococcus agalactiae NOT DETECTED NOT DETECTED Final   Streptococcus pneumoniae NOT DETECTED NOT DETECTED Final   Streptococcus pyogenes NOT DETECTED NOT DETECTED Final   A.calcoaceticus-baumannii NOT DETECTED NOT DETECTED Final   Bacteroides fragilis NOT DETECTED NOT DETECTED Final   Enterobacterales NOT DETECTED NOT DETECTED Final   Enterobacter cloacae complex NOT DETECTED NOT DETECTED Final   Escherichia coli NOT DETECTED NOT DETECTED Final   Klebsiella aerogenes NOT DETECTED NOT DETECTED Final   Klebsiella oxytoca NOT DETECTED NOT DETECTED Final   Klebsiella pneumoniae NOT DETECTED NOT DETECTED Final   Proteus species NOT DETECTED NOT DETECTED Final   Salmonella species NOT DETECTED NOT DETECTED Final   Serratia marcescens NOT DETECTED NOT DETECTED Final   Haemophilus influenzae NOT DETECTED NOT DETECTED Final   Neisseria meningitidis NOT DETECTED NOT DETECTED Final   Pseudomonas aeruginosa NOT DETECTED NOT DETECTED Final    Stenotrophomonas maltophilia NOT DETECTED NOT DETECTED Final   Candida albicans NOT DETECTED NOT DETECTED Final   Candida auris NOT DETECTED NOT DETECTED Final   Candida glabrata NOT DETECTED NOT DETECTED Final   Candida krusei NOT DETECTED NOT DETECTED Final   Candida parapsilosis NOT DETECTED NOT DETECTED Final   Candida tropicalis NOT DETECTED NOT DETECTED Final   Cryptococcus neoformans/gattii NOT DETECTED NOT DETECTED Final    Comment: Performed at [facility], [address], [place], [place] [address]  MRSA Next Gen by PCR, Nasal     Status: None   Collection Time: 04/06/21  8:45 AM   Specimen: Nasal Mucosa; Nasal Swab  Result Value Ref Range Status   MRSA by PCR Next Gen NOT DETECTED NOT DETECTED Final    Comment: (NOTE) The GeneXpert MRSA Assay (FDA approved for NASAL specimens only), is one component of a comprehensive MRSA colonization surveillance program. It is not intended to diagnose MRSA infection nor to guide or monitor treatment for MRSA infections. Test performance is not FDA approved in patients less than 11 years old. Performed at [facility], [address] [place], [place] [address]   Culture, blood (routine x 2)     Status: None (Preliminary result)   Collection Time: 04/10/21  4:58 PM   Specimen: Left Antecubital; Blood  Result Value Ref Range Status   Specimen Description   Final    LEFT ANTECUBITAL BOTTLES DRAWN AEROBIC AND ANAEROBIC   Special Requests   Final    Blood Culture results may not be optimal due to an excessive volume  of blood received in culture bottles   Culture   Final    NO GROWTH 2 DAYS Performed at [facility], [address] [place], [place] [address]    Report Status PENDING  Incomplete  Culture, blood (routine x 2)     Status: None (Preliminary result)   Collection Time: 04/10/21  5:09 PM   Specimen: BLOOD LEFT HAND  Result Value Ref Range Status   Specimen Description   Final    BLOOD LEFT HAND BOTTLES DRAWN AEROBIC AND  ANAEROBIC   Special Requests   Final    Blood Culture results may not be optimal due to an excessive volume of blood received in culture bottles   Culture   Final    NO GROWTH 2 DAYS Performed at [facility], [address] [place], [place] [address]    Report Status PENDING  Incomplete         Radiology Studies: DG CHEST PORT 1 VIEW  Result Date: 04/11/2021 CLINICAL DATA:  Patient pulled  PICC out EXAM: PORTABLE CHEST 1 VIEW COMPARISON:  04/06/2021 FINDINGS: Right arm PICC tip at the cavoatrial junction unchanged from the prior study. Progression of patchy right lower lobe infiltrate. Small right pleural effusion as progressed. No change in left lower lobe infiltrate. Mild cardiac enlargement.  Negative for heart failure or edema. IMPRESSION: PICC tip in the SVC at the cavoatrial junction unchanged from prior study Progression of right lower lobe infiltrate and effusion possible pneumonia. No change left lower lobe infiltrate. Electronically Signed   By: Franchot Gallo M.D.   On: 04/11/2021 13:19        Scheduled Meds:  sodium chloride   Intravenous Once   Chlorhexidine Gluconate Cloth  6 each Topical Daily   feeding supplement (GLUCERNA SHAKE)  237 mL Oral TID BM   insulin aspart  0-5 Units Subcutaneous QHS   insulin aspart  0-9 Units Subcutaneous TID WC   insulin aspart  4 Units Subcutaneous TID WC   insulin glargine-yfgn  15 Units Subcutaneous QHS   lactulose  30 g Oral TID   levothyroxine  50 mcg Oral QAC breakfast   lipase/protease/amylase  48,000 Units Oral TID AC   rifaximin  550 mg Oral BID   sodium chloride flush  10-40 mL Intracatheter Q12H   spironolactone  25 mg Oral Daily   Continuous Infusions:  meropenem (MERREM) IV 1 g (04/12/21 1459)   phytonadione (VITAMIN K) IV       LOS: 6 days    Time spent: 60mns    JKathie Dike MD [facility]   If 7PM-7AM, please contact night-coverage [email or web address]  04/12/2021, 7:35 PM

## 2021-04-13 ENCOUNTER — Inpatient Hospital Stay (HOSPITAL_COMMUNITY): Payer: Medicare Other

## 2021-04-13 DIAGNOSIS — D539 Nutritional anemia, unspecified: Secondary | ICD-10-CM

## 2021-04-13 DIAGNOSIS — R109 Unspecified abdominal pain: Secondary | ICD-10-CM

## 2021-04-13 DIAGNOSIS — K7460 Unspecified cirrhosis of liver: Secondary | ICD-10-CM

## 2021-04-13 DIAGNOSIS — R110 Nausea: Secondary | ICD-10-CM

## 2021-04-13 DIAGNOSIS — E7220 Disorder of urea cycle metabolism, unspecified: Secondary | ICD-10-CM

## 2021-04-13 DIAGNOSIS — K219 Gastro-esophageal reflux disease without esophagitis: Secondary | ICD-10-CM

## 2021-04-13 DIAGNOSIS — G8929 Other chronic pain: Secondary | ICD-10-CM

## 2021-04-13 DIAGNOSIS — K7682 Hepatic encephalopathy: Secondary | ICD-10-CM

## 2021-04-13 DIAGNOSIS — R1310 Dysphagia, unspecified: Secondary | ICD-10-CM

## 2021-04-13 LAB — CBC
HCT: 22.9 % — ABNORMAL LOW (ref 36.0–46.0)
Hemoglobin: 7.6 g/dL — ABNORMAL LOW (ref 12.0–15.0)
MCH: 32.9 pg (ref 26.0–34.0)
MCHC: 33.2 g/dL (ref 30.0–36.0)
MCV: 99.1 fL (ref 80.0–100.0)
Platelets: 41 10*3/uL — ABNORMAL LOW (ref 150–400)
RBC: 2.31 MIL/uL — ABNORMAL LOW (ref 3.87–5.11)
RDW: 23.7 % — ABNORMAL HIGH (ref 11.5–15.5)
WBC: 3 10*3/uL — ABNORMAL LOW (ref 4.0–10.5)
nRBC: 0 % (ref 0.0–0.2)

## 2021-04-13 LAB — GLUCOSE, CAPILLARY
Glucose-Capillary: 255 mg/dL — ABNORMAL HIGH (ref 70–99)
Glucose-Capillary: 292 mg/dL — ABNORMAL HIGH (ref 70–99)
Glucose-Capillary: 201 mg/dL — ABNORMAL HIGH (ref 70–99)
Glucose-Capillary: 178 mg/dL — ABNORMAL HIGH (ref 70–99)

## 2021-04-13 LAB — COMPREHENSIVE METABOLIC PANEL
ALT: 21 U/L (ref 0–44)
AST: 55 U/L — ABNORMAL HIGH (ref 15–41)
Albumin: 2.5 g/dL — ABNORMAL LOW (ref 3.5–5.0)
Alkaline Phosphatase: 81 U/L (ref 38–126)
Anion gap: 2 — ABNORMAL LOW (ref 5–15)
BUN: 16 mg/dL (ref 6–20)
CO2: 31 mmol/L (ref 22–32)
Calcium: 8.4 mg/dL — ABNORMAL LOW (ref 8.9–10.3)
Chloride: 102 mmol/L (ref 98–111)
Creatinine, Ser: <0.3 mg/dL — ABNORMAL LOW (ref 0.44–1.00)
Glucose, Bld: 281 mg/dL — ABNORMAL HIGH (ref 70–99)
Potassium: 3.7 mmol/L (ref 3.5–5.1)
Sodium: 135 mmol/L (ref 135–145)
Total Bilirubin: 16.2 mg/dL — ABNORMAL HIGH (ref 0.3–1.2)
Total Protein: 4.6 g/dL — ABNORMAL LOW (ref 6.5–8.1)

## 2021-04-13 LAB — PROTIME-INR
INR: 2.7 — ABNORMAL HIGH (ref 0.8–1.2)
Prothrombin Time: 28.9 seconds — ABNORMAL HIGH (ref 11.4–15.2)

## 2021-04-13 LAB — BILIRUBIN, FRACTIONATED(TOT/DIR/INDIR)
Bilirubin, Direct: 4.3 mg/dL — ABNORMAL HIGH (ref 0.0–0.2)
Indirect Bilirubin: 11.8 mg/dL — ABNORMAL HIGH (ref 0.3–0.9)
Total Bilirubin: 16.1 mg/dL — ABNORMAL HIGH (ref 0.3–1.2)

## 2021-04-13 LAB — IRON AND TIBC
Iron: 113 ug/dL (ref 28–170)
Saturation Ratios: 94 % — ABNORMAL HIGH (ref 10.4–31.8)
TIBC: 120 ug/dL — ABNORMAL LOW (ref 250–450)
UIBC: 7 ug/dL

## 2021-04-13 LAB — VITAMIN B12: Vitamin B-12: 931 pg/mL — ABNORMAL HIGH (ref 180–914)

## 2021-04-13 LAB — AMMONIA: Ammonia: 100 umol/L — ABNORMAL HIGH (ref 9–35)

## 2021-04-13 LAB — FERRITIN: Ferritin: 234 ng/mL (ref 11–307)

## 2021-04-13 MED ORDER — LACTULOSE 10 GM/15ML PO SOLN
30.0000 g | Freq: Four times a day (QID) | ORAL | Status: DC
  Administered 2021-04-13 – 2021-04-15 (×6): 30 g via ORAL
  Filled 2021-04-13 (×8): qty 60

## 2021-04-13 MED ORDER — SPIRONOLACTONE 25 MG PO TABS
100.0000 mg | ORAL_TABLET | Freq: Every day | ORAL | Status: DC
  Administered 2021-04-14 – 2021-04-15 (×2): 100 mg via ORAL
  Filled 2021-04-13 (×2): qty 4

## 2021-04-13 MED ORDER — PANTOPRAZOLE SODIUM 40 MG PO TBEC
40.0000 mg | DELAYED_RELEASE_TABLET | Freq: Every day | ORAL | Status: DC
  Administered 2021-04-13 – 2021-04-15 (×3): 40 mg via ORAL
  Filled 2021-04-13 (×3): qty 1

## 2021-04-13 MED ORDER — SODIUM CHLORIDE 0.9% IV SOLUTION: Freq: Once | INTRAVENOUS | Status: AC

## 2021-04-13 NOTE — Consult Note (Signed)
Referring Provider: [facility] Primary Care Physician:  [facility] [place] Primary Gastroenterologist:  [facility]  Date of Admission: 04/05/21 Date of Consultation: 04/13/21  Reason for Consultation:  history of cirrhosis with persistently elevated ammonia, increasing bilirubin.   HPI:  [name] is a 56 y.o. 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  Chronic pancreatitis (though CT last year without calcifications), who was admitted to the hospital on 04/05/2021 with confusion and lethargy, found to have sepsis from possible UTI, initial blood cultures positive for Staphylococcus simulans, treated with a course of meropenem x7 days (last dose this morning).  She required Levophed for hypotension, but has since been weaned off.  She was noted to be anemic and has required PRBC transfusions this admission (total of 3 units last on 1/22).  Also received a unit of platelets due to thrombocytopenia and oozing at PICC line.  GI now consulted due to elevated ammonia despite lactulose and Xifaxan as well as concerns for worsening hepatic function in the setting of rising bilirubin.    Today: Reports she presented to the ED due to concerns for lethargy and sleeping 24 hours.  At home, she takes lactulose 30 ml 3 times a day with 1-3 bowel movements per day.  Also takes Xifaxan twice daily.  Since coming to the hospital, she has not noticed any significant improvement in her sleepiness though she has no confusion or disorientation.  She also tells me she was started on spironolactone after recent ED evaluation at [facility] on 1/12 due to worsening peripheral edema.  She is taking spironolactone 25 mg daily along with Lasix 20 mg  twice daily.  Reports her swelling is improving, but continues.  Reports her abdomen seems a little more distended than normal.  No history of SBP.  Denies BRBPR, melena, hematemesis.  Admits to occasional nosebleeds.  No vaginal bleeding or hematuria.  She has chronic reflux taking Tums or Rolaids essentially every day.  Also with mild pill dysphagia having to split her large pills in half.  Denies food or liquid dysphagia.  Also with chronic nausea taking Zofran daily to twice daily without vomiting.  Also with chronic generalized abdominal pain, primarily across her mid abdomen that never goes away.  It is crampy and will worsen after meals if she does not take Creon.  Thinks her last EGD and colonoscopy were within the last 5 years in [place] at ?[facility] with ?Dr. Dola Argyle but this is an internal medicine physician and [place] as an outpatient facility.  She does not remember her results.  Does not know if she ever had esophageal varices, PUD.  Also does not know if she has any history of polyps.  Reports she supposed to have repeat colonoscopy every 5 years due to her mother with colon cancer in her 35s.  No urinary symptoms currently. No cough. Chronic SOB at baseline, but on 4 L [place] rather than 2-3  (baseline). No fever or chills today.    Past Medical History:  Diagnosis Date   Degenerative joint disease    Diabetes mellitus without complication ([place])    Gastroesophageal reflux    Pancreatitis    Panic attack    Sleep apnea    Thrombocytopenia ([place])     Past Surgical History:  Procedure Laterality Date   CHOLECYSTECTOMY     SHOULDER SURGERY     left    tongue sx. titanium screw placed to hold tongue down     TOTAL THYROIDECTOMY      Prior to Admission medications   Medication Sig Start Date End Date Taking? Authorizing Provider  albuterol (PROVENTIL) (2.5 MG/3ML) 0.083% nebulizer solution Inhale 2.5 mg into the lungs every 6 (six) hours as  needed for wheezing or shortness of breath.  04/19/19  Yes [provider]  albuterol (VENTOLIN HFA) 108 (90 Base) MCG/ACT inhaler Inhale 2 puffs into the lungs every 4 (four) hours as needed for wheezing or shortness of breath. 04/02/20  Yes Emokpae, Courage, MD  alendronate (FOSAMAX) 70 MG tablet Take 70 mg by mouth once a week. 03/11/21  Yes [provider]  Continuous Blood Gluc Sensor (DEXCOM G6 SENSOR) MISC Inject into the skin See admin instructions. Place a new sensor under the skin every 10 days   Yes [provider]  furosemide (LASIX) 20 MG tablet Take 20 mg by mouth in the morning and at bedtime.   Yes [provider]  insulin aspart (NOVOLOG) 100 UNIT/ML injection Via Insulin Pump 07/31/20  Yes Johnson, Clanford L, MD  lactulose (CHRONULAC) 10 GM/15ML solution Take 45 mLs (30 g total) by mouth 3 (three) times daily. Take 30 grams (45 ml's) by mouth three to five times a day until 3 bowel movements a day are achieved, then as directed 07/31/20  Yes Johnson, Clanford L, MD  levothyroxine (SYNTHROID, LEVOTHROID) 50 MCG tablet Take 50 mcg by mouth daily before breakfast.  07/25/14  Yes [provider]  lipase/protease/amylase (CREON) 12000-38000 units CPEP capsule Take 4 capsules (48,000 Units total) by mouth 3 (three) times daily before meals. 07/31/20  Yes Johnson, Clanford L, MD  Magnesium 400 MG TABS Take 400 mg by mouth daily.   Yes [provider]  nitrofurantoin, macrocrystal-monohydrate, (MACROBID) 100 MG capsule Take 100 mg by mouth 2 (two) times daily. 04/02/21  Yes [provider]  ondansetron (ZOFRAN ODT) 4 MG disintegrating tablet Take 1 tablet (4 mg total) by mouth See admin instructions. Dissolve 4 mg orally one to two times a day Patient taking differently: Take 4 mg by mouth every 8 (eight) hours as needed. 07/31/20  Yes Johnson, Clanford L, MD  Oxycodone HCl 10 MG TABS Take 10 mg by mouth every 6 (six) hours as needed  (pain).   Yes [provider]  OXYGEN Inhale 2-4 L/min into the lungs as needed (for shortness of breath).   Yes [provider]  potassium chloride SA (KLOR-CON) 20 MEQ tablet Take 20 mEq by mouth 2 (two) times daily.   Yes [provider]  spironolactone (ALDACTONE) 25 MG tablet Take 25 mg by mouth daily. 04/02/21  Yes [provider]  Vitamin D, Ergocalciferol, (DRISDOL) 1.25 MG (50000 UNIT) CAPS capsule Take 50,000 Units by mouth every Wednesday. 06/29/20  Yes [provider]  XIFAXAN 550 MG TABS tablet Take 550 mg by mouth 2 (two) times daily. 05/28/20  Yes [provider]  cyclobenzaprine (FLEXERIL) 5 MG tablet Take 1 tablet (5 mg total) by mouth 3 (three) times daily as needed for muscle spasms. Patient not taking: Reported on 04/06/2021 12/13/20   Evalee Jefferson, PA-C  pregabalin (LYRICA) 75 MG capsule Take 75 mg by mouth 3 (three) times daily. Patient not taking: Reported on 04/06/2021 12/04/20   [provider]  lisinopril (ZESTRIL) 10 MG  tablet Take 10 mg by mouth daily. Patient not taking: Reported on 10/02/2019 03/29/19 10/02/19  [provider]  metFORMIN (GLUCOPHAGE) 1000 MG tablet Take 1,000 mg by mouth 2 (two) times daily with a meal.  Patient not taking: Reported on 10/02/2019  10/02/19  [provider]  metoCLOPramide (REGLAN) 10 MG tablet Take 10 mg by mouth 4 (four) times daily.  Patient not taking: Reported on 10/02/2019  10/02/19  [provider]  sertraline (ZOLOFT) 100 MG tablet Take 150 mg by mouth daily. Patient not taking: Reported on 10/02/2019 09/10/16 10/02/19  [provider]  traZODone (DESYREL) 100 MG tablet Take 1 tablet by mouth daily. Patient not taking: Reported on 10/02/2019 09/15/16 10/02/19  [provider]    Current Facility-Administered Medications  Medication Dose Route Frequency Provider Last Rate Last Admin   0.9 %  sodium chloride infusion (Manually program via  Guardrails IV Fluids)   Intravenous Once Kathie Dike, MD       0.9 %  sodium chloride infusion (Manually program via Guardrails IV Fluids)   Intravenous Once Kathie Dike, MD       albumin human 25 % solution 25 g  25 g Intravenous BID Kathie Dike, MD 60 mL/hr at 04/13/21 0933 25 g at 04/13/21 0933   ALPRAZolam (XANAX) tablet 0.25 mg  0.25 mg Oral BID PRN Heath Lark D, DO   0.25 mg at 04/12/21 2305   Chlorhexidine Gluconate Cloth 2 % PADS 6 each  6 each Topical Daily Adefeso, Oladapo, DO   6 each at 04/12/21 1300   feeding supplement (GLUCERNA SHAKE) (GLUCERNA SHAKE) liquid 237 mL  237 mL Oral TID BM Adefeso, Oladapo, DO   237 mL at 04/13/21 1003   furosemide (LASIX) injection 20 mg  20 mg Intravenous BID Kathie Dike, MD   20 mg at 04/13/21 0809   insulin aspart (novoLOG) injection 0-5 Units  0-5 Units Subcutaneous QHS Adefeso, Oladapo, DO   2 Units at 04/12/21 2311   insulin aspart (novoLOG) injection 0-9 Units  0-9 Units Subcutaneous TID WC Adefeso, Oladapo, DO   5 Units at 04/13/21 0819   insulin aspart (novoLOG) injection 4 Units  4 Units Subcutaneous TID WC Kathie Dike, MD   4 Units at 04/13/21 0819   insulin glargine-yfgn (SEMGLEE) injection 15 Units  15 Units Subcutaneous QHS Kathie Dike, MD   15 Units at 04/12/21 2310   lactulose (CHRONULAC) 10 GM/15ML solution 30 g  30 g Oral TID Adefeso, Oladapo, DO   30 g at 04/13/21 0935   levothyroxine (SYNTHROID) tablet 50 mcg  50 mcg Oral QAC breakfast Adefeso, Oladapo, DO   50 mcg at 04/13/21 9242   lipase/protease/amylase (CREON) capsule 48,000 Units  48,000 Units Oral TID AC Adefeso, Oladapo, DO   48,000 Units at 04/13/21 0808   ondansetron (ZOFRAN) injection 4 mg  4 mg Intravenous Q6H PRN Manuella Ghazi, Pratik D, DO   4 mg at 04/07/21 6834   oxyCODONE (Oxy IR/ROXICODONE) immediate release tablet 5 mg  5 mg Oral Q6H PRN Kathie Dike, MD   5 mg at 04/13/21 0415   rifaximin (XIFAXAN) tablet 550 mg  550 mg Oral BID Adefeso, Oladapo,  DO   550 mg at 04/13/21 0920   sodium chloride flush (NS) 0.9 % injection 10-40 mL  10-40 mL Intracatheter Q12H Shah, Pratik D, DO   10 mL at 04/13/21 0935   sodium chloride flush (NS) 0.9 % injection 10-40 mL  10-40 mL Intracatheter PRN Manuella Ghazi, Pratik  D, DO       spironolactone (ALDACTONE) tablet 25 mg  25 mg Oral Daily Kathie Dike, MD   25 mg at 04/13/21 0920    Allergies as of 04/05/2021 - Review Complete 03/17/2021  Allergen Reaction Noted   Acetaminophen Hives and Other (See Comments) 08/10/2012   Cephalexin Itching, Swelling, and Other (See Comments) 12/25/2014   Ciprofloxacin Hives 08/10/2012   Hydromorphone Hives 01/17/2014   Sulfamethoxazole-trimethoprim Shortness Of Breath, Swelling, and Other (See Comments) 08/10/2012   Abilify [aripiprazole]  06/06/2020    No family history on file.  Social History   Socioeconomic History   Marital status: Single    Spouse name: Not on file   Number of children: Not on file   Years of education: Not on file   Highest education level: Not on file  Occupational History   Not on file  Tobacco Use   Smoking status: Never   Smokeless tobacco: Never  Vaping Use   Vaping Use: Never used  Substance and Sexual Activity   Alcohol use: No   Drug use: No   Sexual activity: Not Currently  Other Topics Concern   Not on file  Social History Narrative   Not on file   Social Determinants of Health   Financial Resource Strain: Not on file  Food Insecurity: Not on file  Transportation Needs: Not on file  Physical Activity: Not on file  Stress: Not on file  Social Connections: Not on file  Intimate Partner Violence: Not on file    Review of Systems: Gen: Denies fever, chills, cold or flulike symptoms, presyncope, syncope. CV: Denies chest pain, heart palpitations. Resp: Admits to chronic shortness of breath.  No cough. GI: See HPI GU : Denies urinary burning, urinary frequency, urinary incontinence.  MS: Denies joint pain. Derm:  Denies rash. Heme: See HPI  Physical Exam: Vital signs in last 24 hours: Temp:  [98 F (36.7 C)-98.8 F (37.1 C)] 98 F (36.7 C) (01/24 0459) Pulse Rate:  [72-80] 80 (01/24 0459) Resp:  [18] 18 (01/24 0459) BP: (130-161)/(55-66) 161/66 (01/24 0459) SpO2:  [91 %-97 %] 91 % (01/24 0459) Weight:  [71.1 kg] 71.1 kg (01/24 0500) Last BM Date: 04/13/21 General:   Alert,  chronically ill appearing, jaundiced, pleasant and cooperative in NAD Head:  Normocephalic and atraumatic. Eyes:  with icterus.   Conjunctiva pink. Ears:  Normal auditory acuity. Lungs:  Decreased breath sounds bilaterally.  No wheezes, crackles, or rhonchi. No acute distress. On 4L [place].  Heart:  Regular rate and rhythm; no murmurs, clicks, rubs,  or gallops. Abdomen: Full/mildly distended, but remains soft.  Generalized tenderness to palpation, greatest across the upper abdomen without rebound.  Pitting edema in the lower flanks.  No masses, hepatosplenomegaly or hernias noted. Normal bowel sounds.  Rectal:  Deferred  Msk:  Symmetrical without gross deformities. Normal posture. Extremities:  Without 2-3 + pitting edema up to knees 1-2 + edema up to hips. Neurologic:  Alert and  oriented x4;  grossly normal neurologically. No asterixis.  Skin:  Intact without significant lesions or rashes. Cervical Nodes:  No significant cervical adenopathy. Psych:  Normal mood and affect.  Intake/Output from previous day: 01/23 0701 - 01/24 0700 In: 250 [P.O.:240; I.V.:10] Out: -  Intake/Output this shift: No intake/output data recorded.  Lab Results: Recent Labs    04/11/21 0544 04/12/21 0424 04/13/21 0522  WBC 4.3 4.1 3.0*  HGB 6.9* 8.4* 7.6*  HCT 20.9* 25.0* 22.9*  PLT 34* 45*  41*   BMET Recent Labs    04/11/21 0544 04/12/21 0424 04/13/21 0522  NA 133* 132* 135  K 3.4* 3.6 3.7  CL 101 99 102  CO2 27 28 31   GLUCOSE 202* 246* 281*  BUN 22* 17 16  CREATININE <0.30* <0.30* <0.30*  CALCIUM 8.1* 8.1* 8.4*    LFT Recent Labs    04/11/21 0544 04/12/21 0424 04/13/21 0522  PROT 4.1* 4.5* 4.6*  ALBUMIN 2.1* 2.3* 2.5*  AST 51* 57* 55*  ALT 19 21 21   ALKPHOS 84 87 81  BILITOT 12.9* 16.0* 16.2*   PT/INR Recent Labs    04/12/21 0424  LABPROT 27.5*  INR 2.6*    Studies/Results: DG CHEST PORT 1 VIEW  Result Date: 04/11/2021 CLINICAL DATA:  Patient pulled  PICC out EXAM: PORTABLE CHEST 1 VIEW COMPARISON:  04/06/2021 FINDINGS: Right arm PICC tip at the cavoatrial junction unchanged from the prior study. Progression of patchy right lower lobe infiltrate. Small right pleural effusion as progressed. No change in left lower lobe infiltrate. Mild cardiac enlargement.  Negative for heart failure or edema. IMPRESSION: PICC tip in the SVC at the cavoatrial junction unchanged from prior study Progression of right lower lobe infiltrate and effusion possible pneumonia. No change left lower lobe infiltrate. Electronically Signed   By: Franchot Gallo M.D.   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  She required Levophed for hypotension, but has since been weaned off.  Also required 3 units PRBCs this admission without overt GI bleeding, 1 unit of platelets, and GI consulted today due to elevated ammonia despite lactulose and Xifaxan as well as concern for worsening hepatic function in the setting of rising  bilirubin.  Hyperammonemia: Likely multifactorial in setting of cirrhosis and acute infection. Currently taking Lactulose 30 g 3 times daily and Xifaxan twice daily.  Having 2-3 BMs daily. Patient does continue to report feeling sleepy, but she is alert and oriented x4 and has no asterixis on exam.  I am not sure that her ammonia can account for all of her "sleepiness".  I suspect acute illness is likely contributing. Will optimize her lactulose with the goal of 3-4 BMs per day and continue Xifaxan.  As a general recommendation, would not continue to follow ammonia, but rather follow patient clinically.  Hyperbilirubinemia: 20.7 on admission, down to 12.9 in 1/22, back up to 16.2 today.  Likely multifactorial in the setting of cirrhosis and acute illness.  She has chronic, unchanged abdominal pain.  Ultrasound this admission with stable dilated CBD at 1.1 cm, postcholecystectomy.  LFTs have remained essentially normal aside from mildly elevated AST.  We will fractionate bilirubin to help sort this out.  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   Cirrhosis: Secondary to NASH/?ASH with alcohol use previously. MELD Na 30 yesterday driven by hyperbilirubinemia and elevated INR.  Follows with [facility] and previously recommended against transplant in light of other comorbidities. Currently abstinent from alcohol.  Reports 1 drink on July 4 last year.  Cirrhosis  has been complicated by hepatic encephalopathy, thrombocytopenia (s/p units of platelets this admission), chronic thrombosis of portal vein, and now with anasarca (pitting edema up to her hips, edema in her forearms and hands, and bilateral flanks).  Patient reports peripheral edema had been improving with outpatient regimen of Lasix 20 mg twice daily and spironolactone 25 mg daily.  Notably, she was not on diuretics the first few days of her admission, received IV fluids in the setting of sepsis, and has gained 11 pound since  admission.  She was started on IV Lasix 20 mg twice daily on 1/19, but only received 1 dose on the 21st and 22nd (possibly secondary to slight hypokalemia and hyponatremia now resolved). Also receiving spironolactone 25 mg daily since 1/19 and has received Albumin this admission. She has room to increase her diuretics based on kidney function. Will need to monitor electrolytes. Will start with increasing aldactone to 100 mg daily. Will also reevaluate degree of ascites with [place].   Regarding her elevated INR, she received 10 mg IV vitamin K yesterday for INR of 2.6.  We will need to recheck this today.   Acute on chronic anemia: Chronic macrocytic anemia with recent hemoglobin in the 10 range prior to admission.  Hemoglobin 7.8 on admission, down to 6.5 on 1/18.  She received 1 unit on 1/17 and 1/18.  Hemoglobin improved to 8.4 on 1/19, but trended back down to 6.9.  Received 1/3 unit PRBCs on 1/22 with post transfusion hemoglobin 8.4.  Hemoglobin back down to 7.6 today.  She has no overt GI bleeding.  Stool heme negative on 1/17.  Per patient, last EGD and colonoscopy within the last 5 years with unknown results and it isn't clear where they were performed.  Chronic nausea and chronic abdominal pain: Chronic abdominal pain across her entire mid abdomen as well as chronic nausea without vomiting taking Zofran daily to twice daily and Creon with meals for reported chronic pancreatitis; however, no evidence of chronic pancreatitis on CT last year.  Interestingly, she does report Creon prevents worsening of abdominal pain with meals.  She does have chronic uncontrolled GERD which I suspect is influencing her symptoms.  Unable to rule out gastritis, duodenitis, PUD, H. pylori, malignancy, gastroparesis. Patient thinks she had an EGD within the last 5 years, but it is not clear where this was performed, unknown results.  She would likely benefit from EGD in the near future. On exam her abdomen is full/mildly  distended, but remains soft.  Generalized tenderness to palpation, greatest across the upper abdomen without rebound.  Patient reports this is her baseline.  She does have significantly elevated bilirubin at 16.2, but only with mild elevation of AST, ALT and alk phos remain normal and I suspect this is related to underlying cirrhosis and acute illness rather than related to her chronic pain as per above.  Lipase was only slightly elevated at 59 on 1/17.  RUQ ultrasound with evidence of postcholecystectomy state, chronic, stable dilated CBD at 1.1 cm.  Ultrasound to evaluate ascites on 1/20 with insufficient ascites for diagnostic para.  Will start PPI daily. Holding off on repeat CT for now as she reports symptoms are at baseline.  I will plan to repeat an ultrasound to evaluate for degree of ascites.  If sufficient amount, would consider diagnostic tap to rule out SBP. If worsening abdominal pain, consider CT.   GERD: Chronic.  Uncontrolled utilizing Tums daily.  Will start PPI daily.   Dysphagia: Patient reports  long history of trouble swallowing large pills, having to break them in half.  No trouble with swallowing foods or liquids.  Also with uncontrolled GERD.  Could have esophageal web, ring, or stricture or underlying dysmotility.  Less likely malignancy.  Would benefit from EGD in the near future.   Plan: [place] (ascites) Fractionate bilirubin INR Vitamin B 12 and iron panel Increase lactulose to 45 ml TID Continue Xifaxan 550 mg twice daily Increase spironolactone to 100 mg daily Continue IV Lasix 20 mg twice daily. Daily weights 2 g sodium diet Start Protonix 40 mg daily.  Continue Creon. CBC, CMP, INR daily. Monitor for overt GI bleeding.  Would benefit from EGD for evaluation of her abdominal pain, dysphagia, and variceal surveillance at some point in the near future. Will discuss with Dr. Jenetta Downer.       LOS: 7 days    04/13/2021, 11:27 AM   Aliene Altes,  PA-C [facility]     ADDENDUM Briefly, 56 year old female with past medical history of cirrhosis due to possible NASH and prior history of alcohol abuse complicated by recurrent hepatic encephalopathy, hypothyroidism, diabetes, questionable chronic pancreatitis, who came to the hospital after presenting worsening mental status.  The patient was found to have a UTI due to steroids.  Global to stimulus which was treated with meropenem for 10 days.  Patient had septic shock that responded to Levophed and antibiotic use.  Gastroenterology was consulted due to persistent elevation of ammonia level and questionable hepatic encephalopathy.  The patient had change in her mental status described as lethargy which eventually improved after she was started on lactulose.  She reports having an average of 1-2 bowel movements per day.  She was started on Xifaxan twice a day.  Also noticed worsening edema in her lower extremities.  Notably, the patient has received 3 units of PRBC during the current hospitalization due to recurrent anemia.  She has not presented any melena or hematochezia.  It is unclear who has been following her for her cirrhosis as she reports that she has been following at [facility].  She was also being followed by [facility] for liver transplant evaluation but she was considered to be a poor candidate for liver transplant given her comorbidities.  She reports having EGD and colonoscopy in the last 5 years but there are no reports for this in the medical record.  The patient has presented improvement in her mental status and endoscopic submucosal dissection is AO x3 at the moment.  Does not have any asterixis.  Ammonia today was 100.  Her labs have been remarkable for markedly elevated total bilirubin of 16.1, mainly coming from her indirect bilirubin a as it was 11.8 and the thyroid bilirubin was 4.3, normal renal function, mildly elevated AST of 55, ALT of 21, alkaline  phosphatase of 81, CBC with white blood cell count of 3.0, hemoglobin of 7.6 and platelets of 41. MELD 31.  The patient is currently not presenting any findings concerning for overt hepatic encephalopathy.  She does not have any asterixis or any other abnormalities.  Has been slightly somnolent but she reports that this is her baseline.  We will recommend increasing her lactulose to achieve a total of 3-4 bowel movements per day and continue with Xifaxan 550 mg twice a day.  Clinically, she is presenting significant third spacing in her lower extremities.  Will benefit from continuing Lasix 20 mg twice a day and increasing her spironolactone 100 mg daily.  No presence  of significant ascites on ultrasound.  Maylon Peppers, MD Gastroenterology and Hepatology [facility]

## 2021-04-13 NOTE — Progress Notes (Signed)
°   04/13/21 1119  Provider Notification  Provider Name/Title Dr Kerry Hough  Date Provider Notified 04/13/21  Time Provider Notified 1119  Notification Type  (secure chat)  Notification Reason Other (Comment) (bleeding around PICC line)   Notified Dr Kerry Hough of bleeding to PICC Line site. SWOT RN is here and will come to assess. Pressure dressing in place at this time.

## 2021-04-13 NOTE — Progress Notes (Addendum)
Original note is consult note

## 2021-04-13 NOTE — Progress Notes (Signed)
She denies PROGRESS NOTE    [name]  OFB:[id number] DOB: 1965/07/04 DOA: 04/05/2021 PCP: [facility]    Brief Narrative:  56 year old female with a history of cirrhosis, admitted to the hospital with confusion and lethargy.  Found to have sepsis from possible UTI.  She was also found to have positive blood cultures and is on antibiotics.  Ammonia is noted to be elevated she is on lactulose and Xifaxan.  She is also noted to be anemic and has been receiving PRBC transfusions.  For hypotension, she has required Levophed infusion which has since been weaned off.   Assessment & Plan:   Principal Problem:   Sepsis secondary to UTI [facility]) Active Problems:   Gastroesophageal reflux   Thrombocytopenia (HCC)   Alcoholic cirrhosis (HCC)   Cirrhosis ([place])   Dysphagia   Hyperbilirubinemia   Hypoalbuminemia due to protein-calorie malnutrition (HCC)   Generalized weakness   Chronic nausea   Gastro-esophageal reflux disease without esophagitis   Macrocytic anemia   Adult hypothyroidism   Supratherapeutic INR   Hyponatremia   Hyperglycemia due to diabetes mellitus (HCC)   Hyperammonemia (HCC)   Severe sepsis -Related to ESBL E coli UTI -She had 2 sets of blood cultures drawn on admission.  3 out of 4 bottles positive for Staphylococcus simulans.  Discussed with ID Dr. Gale Journey who recommended repeating blood cultures.  If repeat cultures are negative, could consider positive cultures contaminants -Blood cultures repeated 1/21, currently no growth -She briefly required Levophed infusion, but has since been weaned off. -Lactic acid is trending down -she completed a total of 7 days of Meropenem for ESBL E coli   Symptomatic anemia -Noted to have hemoglobin of 7.8 on admission -She has also been transfused a total of 3 units of PRBC thus far for hemoglobin that has repeatedly declined -Follow-up hemoglobin 8.4->7.2->7.5->6.9->8.4->7.6 -FOBT negative -Staff reports continued  bleeding around her PICC line.  Since she is significantly thrombocytopenic, she received a unit of platelets -INR elevated so 2 units FFP ordered -continue to follow hemoglobin  Hepatic encephalopathy -Ammonia up to 94->46->76->100 -Staff reports that she is having bowel movements -Continue lactulose and Xifaxan -will request GI input  Thrombocytopenia -Related to underlying liver disease  NASH Cirrhosis -Appears to be decompensated -Bilirubin up to 20.7 on admission -Currently at 16.2 -Transaminases are unremarkable -She is status post cholecystectomy -Right upper quadrant ultrasound shows stable CBD dilatation.  She does not have any right upper quadrant pain or vomiting. -We will continue to monitor for now -Restarted on IV albumin and Lasix/Aldactone -Ultrasound abdomen did not show a significant amount of ascites for therapeutic paracentesis  Hypothyroidism -Continue Synthroid  Insulin dependent diabetes mellitus -Followed by [facility]ology at [facility] -She appears to be on an insulin pump as an outpatient. -Currently on sliding scale insulin -Currently on basal insulin, will increase dose since she is persistently hyperglycemic -added NovoLog for meal coverage  Chronic respiratory failure with hypoxia -Chronically on 2 to 3 L of supplemental oxygen  Moderate Malnutrition -nutrition services following    DVT prophylaxis: SCDs Start: 04/06/21 0834  Code Status: DNR Family Communication: At patient's request, I updated her significant other over the phone 1/18 Disposition Plan: Status is: Inpatient  Remains inpatient appropriate because: Continued IV antibiotics and management of encephalopathy.    Consultants:  GI  Procedures:    Antimicrobials:  Meropenem 1/17 >1/23 Vancomycin 1/18 > 1/18   Subjective: Still has significant edema, feels tired. Staff notes continued bleeding from right  arm picc site  Objective: Vitals:   04/13/21 1510  04/13/21 1539 04/13/21 1540 04/13/21 1714  BP: (!) 123/47 (!) 112/52 (!) 124/54 (!) 124/54  Pulse: 82 85 84 84  Resp: 16 16 16 16   Temp: 98.7 F (37.1 C) 98.7 F (37.1 C) 98.4 F (36.9 C) 98.4 F (36.9 C)  TempSrc:      SpO2: 100% 98%  96%  Weight:      Height:        Intake/Output Summary (Last 24 hours) at 04/13/2021 2146 Last data filed at 04/13/2021 1837 Gross per 24 hour  Intake 1068.95 ml  Output --  Net 1068.95 ml   Filed Weights   04/06/21 0833 04/07/21 0543 04/13/21 0500  Weight: 62.3 kg 66 kg 71.1 kg    Examination: General exam: Alert, awake, oriented x 3 Respiratory system: Clear to auscultation. Respiratory effort normal. Cardiovascular system:RRR. No murmurs, rubs, gallops. Gastrointestinal system: Abdomen is nondistended, soft and nontender. No organomegaly or masses felt. Normal bowel sounds heard. Central nervous system: Alert and oriented. No focal neurological deficits. Extremities: 1-2+ pitting edema bilaterally Skin: No rashes, lesions or ulcers Psychiatry: Judgement and insight appear normal. Mood & affect appropriate.    Data Reviewed: I have personally reviewed following labs and imaging studies  CBC: Recent Labs  Lab 04/09/21 0352 04/10/21 0427 04/11/21 0544 04/12/21 0424 04/13/21 0522  WBC 4.2 4.6 4.3 4.1 3.0*  HGB 7.2* 7.5* 6.9* 8.4* 7.6*  HCT 21.9* 22.2* 20.9* 25.0* 22.9*  MCV 99.5 101.8* 100.0 97.7 99.1  PLT 34* 32* 34* 45* 41*   Basic Metabolic Panel: Recent Labs  Lab 04/07/21 0450 04/08/21 0443 04/09/21 0352 04/10/21 0427 04/11/21 0544 04/12/21 0424 04/13/21 0522  NA 129* 134* 133* 134* 133* 132* 135  K 3.9 3.6 3.7 3.2* 3.4* 3.6 3.7  CL 97* 100 100 101 101 99 102  CO2 26 28 28 28 27 28 31   GLUCOSE 283* 227* 306* 271* 202* 246* 281*  BUN 29* 33* 26* 24* 22* 17 16  CREATININE 0.73 0.67 0.38* 0.33* <0.30* <0.30* <0.30*  CALCIUM 7.8* 8.4* 8.6* 8.4* 8.1* 8.1* 8.4*  MG 1.8 1.8  --   --  1.5* 1.7  --   PHOS  --  2.3*  --    --  2.3* 3.2  --    GFR: CrCl cannot be calculated (This lab value cannot be used to calculate CrCl because it is not a number: <0.30). Liver Function Tests: Recent Labs  Lab 04/09/21 0352 04/10/21 0427 04/11/21 0544 04/12/21 0424 04/13/21 0522  AST 48* 46* 51* 57* 55*  ALT 21 17 19 21 21   ALKPHOS 73 76 84 87 81  BILITOT 14.1* 13.1* 12.9* 16.0* 16.1*   16.2*  PROT 4.3* 4.2* 4.1* 4.5* 4.6*  ALBUMIN 1.8* 2.1* 2.1* 2.3* 2.5*   No results for input(s): LIPASE, AMYLASE in the last 168 hours.  Recent Labs  Lab 04/07/21 0450 04/08/21 0443 04/10/21 1630 04/11/21 0544 04/13/21 0522  AMMONIA 94* 46* 76* 74* 100*   Coagulation Profile: Recent Labs  Lab 04/12/21 0424 04/13/21 1318  INR 2.6* 2.7*   Cardiac Enzymes: No results for input(s): CKTOTAL, CKMB, CKMBINDEX, TROPONINI in the last 168 hours. BNP (last 3 results) No results for input(s): PROBNP in the last 8760 hours. HbA1C: No results for input(s): HGBA1C in the last 72 hours. CBG: Recent Labs  Lab 04/12/21 1604 04/12/21 2148 04/13/21 0815 04/13/21 1107 04/13/21 1618  GLUCAP 262* 216* 255* 292* 201*  Lipid Profile: No results for input(s): CHOL, HDL, LDLCALC, TRIG, CHOLHDL, LDLDIRECT in the last 72 hours. Thyroid Function Tests: No results for input(s): TSH, T4TOTAL, FREET4, T3FREE, THYROIDAB in the last 72 hours. Anemia Panel: Recent Labs    04/13/21 0522  VITAMINB12 931*  FERRITIN 234  TIBC 120*  IRON 113    Sepsis Labs: Recent Labs  Lab 04/07/21 0450  LATICACIDVEN 2.8*    Recent Results (from the past 240 hour(s))  Urine Culture     Status: Abnormal   Collection Time: 04/05/21 12:36 AM   Specimen: Urine, Clean Catch  Result Value Ref Range Status   Specimen Description   Final    URINE, CLEAN CATCH Performed at [facility], [address], [place], [place] [address]    Special Requests   Final    NONE Performed at [facility], [address], [place],  [address]    Culture (A)   Final    50,000 COLONIES/mL ESCHERICHIA COLI Confirmed Extended Spectrum Beta-Lactamase Producer (ESBL).  In bloodstream infections from ESBL organisms, carbapenems are preferred over piperacillin/tazobactam. They are shown to have a lower risk of mortality.    Report Status 04/08/2021 FINAL  Final   Organism ID, Bacteria ESCHERICHIA COLI (A)  Final      Susceptibility   Escherichia coli - MIC*    AMPICILLIN >=32 RESISTANT Resistant     CEFAZOLIN >=64 RESISTANT Resistant     CEFEPIME 16 RESISTANT Resistant     CEFTRIAXONE >=64 RESISTANT Resistant     CIPROFLOXACIN >=4 RESISTANT Resistant     GENTAMICIN <=1 SENSITIVE Sensitive     IMIPENEM <=0.25 SENSITIVE Sensitive     NITROFURANTOIN <=16 SENSITIVE Sensitive     TRIMETH/SULFA <=20 SENSITIVE Sensitive     AMPICILLIN/SULBACTAM >=32 RESISTANT Resistant     PIP/TAZO 32 INTERMEDIATE Intermediate     * 50,000 COLONIES/mL ESCHERICHIA COLI  Resp Panel by RT-PCR (Flu A&B, Covid) Nasopharyngeal Swab     Status: None   Collection Time: 04/05/21 10:15 PM   Specimen: Nasopharyngeal Swab; Nasopharyngeal(NP) swabs in vial transport medium  Result Value Ref Range Status   SARS Coronavirus 2 by RT PCR NEGATIVE NEGATIVE Final    Comment: (NOTE) SARS-CoV-2 target nucleic acids are NOT DETECTED.  The SARS-CoV-2 RNA is generally detectable in upper respiratory specimens during the acute phase of infection. The lowest concentration of SARS-CoV-2 viral copies this assay can detect is 138 copies/mL. A negative result does not preclude SARS-Cov-2 infection and should not be used as the sole basis for treatment or other patient management decisions. A negative result may occur with  improper specimen collection/handling, submission of specimen other than nasopharyngeal swab, presence of viral mutation(s) within the areas targeted by this assay, and inadequate number of viral copies(<138 copies/mL). A negative result must be combined with clinical  observations, patient history, and epidemiological information. The expected result is Negative.  Fact Sheet for Patients:  EntrepreneurPulse.com.au  Fact Sheet for Healthcare Providers:  IncredibleEmployment.be  This test is no t yet approved or cleared by the [place] FDA and  has been authorized for detection and/or diagnosis of SARS-CoV-2 by FDA under an Emergency Use Authorization (EUA). This EUA will remain  in effect (meaning this test can be used) for the duration of the COVID-19 declaration under Section 564(b)(1) of the Act, 21 U.S.C.section 360bbb-3(b)(1), unless the authorization is terminated  or revoked sooner.       Influenza A by PCR NEGATIVE NEGATIVE Final   Influenza  B by PCR NEGATIVE NEGATIVE Final    Comment: (NOTE) The Xpert Xpress SARS-CoV-2/FLU/RSV plus assay is intended as an aid in the diagnosis of influenza from Nasopharyngeal swab specimens and should not be used as a sole basis for treatment. Nasal washings and aspirates are unacceptable for Xpert Xpress SARS-CoV-2/FLU/RSV testing.  Fact Sheet for Patients: EntrepreneurPulse.com.au  Fact Sheet for Healthcare Providers: IncredibleEmployment.be  This test is not yet approved or cleared by the [place] FDA and has been authorized for detection and/or diagnosis of SARS-CoV-2 by FDA under an Emergency Use Authorization (EUA). This EUA will remain in effect (meaning this test can be used) for the duration of the COVID-19 declaration under Section 564(b)(1) of the Act, 21 U.S.C. section 360bbb-3(b)(1), unless the authorization is terminated or revoked.  Performed at [facility], [address] [place], [place] [address]   Blood Culture (routine x 2)     Status: Abnormal   Collection Time: 04/05/21 10:39 PM   Specimen: Left Antecubital; Blood  Result Value Ref Range Status   Specimen Description   Final    LEFT  ANTECUBITAL Performed at [facility], [address], [place], [place] [address]    Special Requests   Final    BOTTLES DRAWN AEROBIC AND ANAEROBIC Blood Culture adequate volume Performed at [facility], [address], [place], [place] [address]    Culture  Setup Time   Final    GRAM POSITIVE COCCI IN CLUSTERS IN BOTH AEROBIC AND ANAEROBIC BOTTLES CALL PREVIOUS TO ICU FOILEY, B. Organism ID to follow CRITICAL RESULT CALLED TO, READ BACK BY AND VERIFIED WITH: JESSICA HEARN RN 04/06/21 @2039  BY JW Performed at [facility], [place] [address], [place], [place] [address]    Culture STAPHYLOCOCCUS SIMULANS (A)  Final   Report Status 04/08/2021 FINAL  Final   Organism ID, Bacteria STAPHYLOCOCCUS SIMULANS  Final      Susceptibility   Staphylococcus simulans - MIC*    CIPROFLOXACIN <=0.5 SENSITIVE Sensitive     ERYTHROMYCIN >=8 RESISTANT Resistant     GENTAMICIN <=0.5 SENSITIVE Sensitive     OXACILLIN <=0.25 SENSITIVE Sensitive     TETRACYCLINE <=1 SENSITIVE Sensitive     VANCOMYCIN <=0.5 SENSITIVE Sensitive     TRIMETH/SULFA <=10 SENSITIVE Sensitive     CLINDAMYCIN >=8 RESISTANT Resistant     RIFAMPIN 16 RESISTANT Resistant     Inducible Clindamycin NEGATIVE Sensitive     * STAPHYLOCOCCUS SIMULANS  Blood Culture (routine x 2)     Status: Abnormal   Collection Time: 04/05/21 10:39 PM   Specimen: BLOOD RIGHT ARM  Result Value Ref Range Status   Specimen Description   Final    BLOOD RIGHT ARM Performed at [facility], [address] [place], [place] [address]    Special Requests   Final    BOTTLES DRAWN AEROBIC ONLY Blood Culture adequate volume Performed at [facility], [address] [place], [place] [address]    Culture  Setup Time   Final    GRAM POSITIVE COCCI IN CLUSTERS AEROBIC BOTTLE ONLY  RESULTS CALLED AND READ BACK FROM FOILY, B AT 1400 ON 04/06/21 BY BROWNING, D.    Culture (A)  Final    STAPHYLOCOCCUS SIMULANS SUSCEPTIBILITIES PERFORMED ON PREVIOUS CULTURE  WITHIN THE LAST 5 DAYS. Performed at [facility], [place] [address], [place], [place] [address]    Report Status 04/08/2021 FINAL  Final  Blood Culture ID Panel (Reflexed)     Status: Abnormal  Collection Time: 04/05/21 10:39 PM  Result Value Ref Range Status   Enterococcus faecalis NOT DETECTED NOT DETECTED Final   Enterococcus Faecium NOT DETECTED NOT DETECTED Final   Listeria monocytogenes NOT DETECTED NOT DETECTED Final   Staphylococcus species DETECTED (A) NOT DETECTED Final    Comment: CRITICAL RESULT CALLED TO, READ BACK BY AND VERIFIED WITH: JESSICA HEARN RN 04/06/21 @2039  BY JW    Staphylococcus aureus (BCID) NOT DETECTED NOT DETECTED Final   Staphylococcus epidermidis NOT DETECTED NOT DETECTED Final   Staphylococcus lugdunensis NOT DETECTED NOT DETECTED Final   Streptococcus species NOT DETECTED NOT DETECTED Final   Streptococcus agalactiae NOT DETECTED NOT DETECTED Final   Streptococcus pneumoniae NOT DETECTED NOT DETECTED Final   Streptococcus pyogenes NOT DETECTED NOT DETECTED Final   A.calcoaceticus-baumannii NOT DETECTED NOT DETECTED Final   Bacteroides fragilis NOT DETECTED NOT DETECTED Final   Enterobacterales NOT DETECTED NOT DETECTED Final   Enterobacter cloacae complex NOT DETECTED NOT DETECTED Final   Escherichia coli NOT DETECTED NOT DETECTED Final   Klebsiella aerogenes NOT DETECTED NOT DETECTED Final   Klebsiella oxytoca NOT DETECTED NOT DETECTED Final   Klebsiella pneumoniae NOT DETECTED NOT DETECTED Final   Proteus species NOT DETECTED NOT DETECTED Final   Salmonella species NOT DETECTED NOT DETECTED Final   Serratia marcescens NOT DETECTED NOT DETECTED Final   Haemophilus influenzae NOT DETECTED NOT DETECTED Final   Neisseria meningitidis NOT DETECTED NOT DETECTED Final   Pseudomonas aeruginosa NOT DETECTED NOT DETECTED Final   Stenotrophomonas maltophilia NOT DETECTED NOT DETECTED Final   Candida albicans NOT DETECTED NOT DETECTED Final   Candida  auris NOT DETECTED NOT DETECTED Final   Candida glabrata NOT DETECTED NOT DETECTED Final   Candida krusei NOT DETECTED NOT DETECTED Final   Candida parapsilosis NOT DETECTED NOT DETECTED Final   Candida tropicalis NOT DETECTED NOT DETECTED Final   Cryptococcus neoformans/gattii NOT DETECTED NOT DETECTED Final    Comment: Performed at [facility], [address] [place], [place] [address]  MRSA Next Gen by PCR, Nasal     Status: None   Collection Time: 04/06/21  8:45 AM   Specimen: Nasal Mucosa; Nasal Swab  Result Value Ref Range Status   MRSA by PCR Next Gen NOT DETECTED NOT DETECTED Final    Comment: (NOTE) The GeneXpert MRSA Assay (FDA approved for NASAL specimens only), is one component of a comprehensive MRSA colonization surveillance program. It is not intended to diagnose MRSA infection nor to guide or monitor treatment for MRSA infections. Test performance is not FDA approved in patients less than 108 years old. Performed at [facility], [address] [place], [place] [address]   Culture, blood (routine x 2)     Status: None (Preliminary result)   Collection Time: 04/10/21  4:58 PM   Specimen: Left Antecubital; Blood  Result Value Ref Range Status   Specimen Description   Final    LEFT ANTECUBITAL BOTTLES DRAWN AEROBIC AND ANAEROBIC   Special Requests   Final    Blood Culture results may not be optimal due to an excessive volume of blood received in culture bottles   Culture   Final    NO GROWTH 3 DAYS Performed at [facility], [address] [place], [place] [address]    Report Status PENDING  Incomplete  Culture, blood (routine x 2)     Status: None (Preliminary result)   Collection Time: 04/10/21  5:09 PM   Specimen: BLOOD LEFT HAND  Result Value Ref Range Status   Specimen Description   Final    BLOOD LEFT HAND BOTTLES DRAWN AEROBIC AND ANAEROBIC   Special Requests   Final    Blood Culture results may not be optimal due to an excessive volume of blood  received in culture bottles   Culture   Final    NO GROWTH 3 DAYS Performed at [facility], [address], [place], [place] [address]    Report Status PENDING  Incomplete         Radiology Studies: [place] ASCITES (ABDOMEN LIMITED)  Result Date: 04/13/2021 CLINICAL DATA:  Cirrhosis, ascites, ascites check for paracentesis EXAM: LIMITED ABDOMEN ULTRASOUND FOR ASCITES TECHNIQUE: Limited ultrasound survey for ascites was performed in all four abdominal quadrants. COMPARISON:  04/09/2021 FINDINGS: Only a small amount of perihepatic ascites is identified. Minimal ascites in RIGHT upper quadrant and RIGHT gutter. Insufficient ascites for paracentesis. Nodular hepatic margins consistent with history of cirrhosis. IMPRESSION: Insufficient ascites for paracentesis. Electronically Signed   By: Lavonia Dana M.D.   On: 04/13/2021 15:21        Scheduled Meds:  sodium chloride   Intravenous Once   Chlorhexidine Gluconate Cloth  6 each Topical Daily   feeding supplement (GLUCERNA SHAKE)  237 mL Oral TID BM   furosemide  20 mg Intravenous BID   insulin aspart  0-5 Units Subcutaneous QHS   insulin aspart  0-9 Units Subcutaneous TID WC   insulin aspart  4 Units Subcutaneous TID WC   insulin glargine-yfgn  15 Units Subcutaneous QHS   lactulose  30 g Oral QID   levothyroxine  50 mcg Oral QAC breakfast   lipase/protease/amylase  48,000 Units Oral TID AC   pantoprazole  40 mg Oral Daily   rifaximin  550 mg Oral BID   sodium chloride flush  10-40 mL Intracatheter Q12H   [START ON 04/14/2021] spironolactone  100 mg Oral Daily   Continuous Infusions:  albumin human 25 g (04/13/21 2134)     LOS: 7 days    Time spent: 47mns    JKathie Dike MD [facility]   If 7PM-7AM, please contact night-coverage [email or web address]  04/13/2021, 9:46 PM

## 2021-04-13 NOTE — Progress Notes (Signed)
Received call regarding patient's PICC line bleeding. Upon assessment of PICC, there is blood noted under dressing, but no visible bleeding seen at the time. Patient does verify that dressing has been changed multiple times due to bleeding. PICC line dressing change performed with addition of placing a few drops of secure port skin glue to insertion site. Kerlex pressure dressing also applied. New PIV inserted on opposite arm of PICC using ultrasound. Gave instructions to RN if bleeding continues despite the mentioned measures above, will remove PICC line.

## 2021-04-13 NOTE — Progress Notes (Signed)
Inpatient Diabetes Program Recommendations  AACE/ADA: New Consensus Statement on Inpatient Glycemic Control (2015)  Target Ranges:  Prepandial:   less than 140 mg/dL      Peak postprandial:   less than 180 mg/dL (1-2 hours)      Critically ill patients:  140 - 180 mg/dL   Lab Results  Component Value Date   GLUCAP 255 (H) 04/13/2021   HGBA1C 5.8 (H) 03/18/2021    Review of Glycemic Control  Latest Reference Range & Units 04/12/21 07:54 04/12/21 11:46 04/12/21 16:04 04/12/21 21:48  Glucose-Capillary 70 - 99 mg/dL 620 (H) 355 (H) 974 (H) 216 (H)  (H): Data is abnormally high  Diabetes history: DM2 Outpatient Diabetes medications: Insulin Pump with Novolog Current orders for Inpatient glycemic control: Semglee 15 units QHS, Novolog 4 units tid meal coverage, Novolog 0-9 units TID with meals, Novolog 0-5 units QHS  Inpatient Diabetes Program Recommendations:   Noted hyperglycemia > 200. -Increase Novolog meal coverage to 5 units tid if eats 50% -Increase Novolog correction to 0-15 units tid, 0-5 units hs Secure chat sent to Dr. Kerry Hough.  Thank you, [name]. Lashanta Elbe, RN, MSN, CDE  Diabetes Coordinator Inpatient Glycemic Control Team Team Pager [phone] (8am-5pm) 04/13/2021 10:39 AM

## 2021-04-13 NOTE — Progress Notes (Signed)
Physical Therapy Treatment Patient Details Name: [name] MRN: [id number] DOB: 04-03-1965 Today's Date: 04/13/2021   History of Present Illness DARIN REDMANN is a 56 y.o. female with medical history significant for NASH cirrhosis, type 2 diabetes mellitus, hypothyroidism, thrombocytopenia, GERD who presents to the emergency department via EMS due to several day history of lethargy and confusion.  Patient states that she has been sleeping a lot since Sunday (2 days ago) and not taking her medications including lactulose, she states that she was without energy and that she has not been eating either.  She complained of some abdominal pain due to abdominal distention.  Patient states that she was recently diagnosed with UTI at [facility] and was prescribed with Macrobid which she was not sure if she was taking since 2 days ago when she was so weak and sleepy.  Her significant other was concerned of her lethargy and confusion, so he insisted that patient should go to the ED for further evaluation and management.  She denies chest pain, shortness of breath, fever, chills, nausea, vomiting    PT Comments    Patient demonstrates increased endurance/distance for gait training demonstrating slow slightly labored cadence without loss of balance and limited mostly due to fatigue.  Patient demonstrates good return for transferring to/from [facility] to have a bowel movement and tolerated sitting up in chair after therapy.  Patient will benefit from continued skilled physical therapy in hospital and recommended venue below to increase strength, balance, endurance for safe ADLs and gait.    Recommendations for follow up therapy are one component of a multi-disciplinary discharge planning process, led by the attending physician.  Recommendations may be updated based on patient status, additional functional criteria and insurance authorization.  Follow Up Recommendations  Home health PT     Assistance  Recommended at Discharge Set up Supervision/Assistance  Patient can return home with the following A little help with walking and/or transfers;A little help with bathing/dressing/bathroom;Assistance with cooking/housework;Help with stairs or ramp for entrance   Equipment Recommendations  None recommended by PT    Recommendations for Other Services       Precautions / Restrictions Precautions Precautions: Fall Restrictions Weight Bearing Restrictions: No     Mobility  Bed Mobility Overal bed mobility: Modified Independent             General bed mobility comments: good return for sitting up at bedside with slightly increased time    Transfers Overall transfer level: Needs assistance Equipment used: Rolling walker (2 wheels) Transfers: Sit to/from Stand, Bed to chair/wheelchair/BSC Sit to Stand: Supervision   Step pivot transfers: Supervision       General transfer comment: increased time, labored movement    Ambulation/Gait Ambulation/Gait assistance: Supervision, Min guard Gait Distance (Feet): 40 Feet Assistive device: Rolling walker (2 wheels) Gait Pattern/deviations: Decreased step length - right, Decreased step length - left, Decreased stride length Gait velocity: decreased     General Gait Details: demonstrates increased endurance/distance for gait training demonstrating slow slightly labored cadence without loss of balance, limite mostly due to fatigue   Stairs             Wheelchair Mobility    Modified Rankin (Stroke Patients Only)       Balance Overall balance assessment: Needs assistance Sitting-balance support: Feet supported, No upper extremity supported Sitting balance-Leahy Scale: Good Sitting balance - Comments: seated at EOB   Standing balance support: During functional activity, No upper extremity supported Standing balance-Leahy Scale: Poor  Standing balance comment: fair/poor without AD, fair/good using RW                             Cognition Arousal/Alertness: Awake/alert Behavior During Therapy: WFL for tasks assessed/performed Overall Cognitive Status: Within Functional Limits for tasks assessed                                          Exercises General Exercises - Lower Extremity Long Arc Quad: Seated, AROM, Strengthening, Both, 10 reps Hip Flexion/Marching: Seated, AROM, Strengthening, Both, 10 reps Toe Raises: Seated, AROM, Strengthening, Both, 10 reps Heel Raises: Seated, AROM, Strengthening, Both, 10 reps    General Comments        Pertinent Vitals/Pain Pain Assessment Pain Assessment: No/denies pain    Home Living                          Prior Function            PT Goals (current goals can now be found in the care plan section) Acute Rehab PT Goals Patient Stated Goal: return home PT Goal Formulation: With patient Time For Goal Achievement: 04/16/21 Potential to Achieve Goals: Good Progress towards PT goals: Progressing toward goals    Frequency    Min 3X/week      PT Plan Current plan remains appropriate    Co-evaluation              AM-PAC PT "6 Clicks" Mobility   Outcome Measure  Help needed turning from your back to your side while in a flat bed without using bedrails?: None Help needed moving from lying on your back to sitting on the side of a flat bed without using bedrails?: None Help needed moving to and from a bed to a chair (including a wheelchair)?: A Little Help needed standing up from a chair using your arms (e.g., wheelchair or bedside chair)?: A Little Help needed to walk in hospital room?: A Little Help needed climbing 3-5 steps with a railing? : A Little 6 Click Score: 20    End of Session   Activity Tolerance: Patient tolerated treatment well;Patient limited by fatigue Patient left: in chair;with call bell/phone within reach Nurse Communication: Mobility status PT Visit Diagnosis: Unsteadiness on  feet (R26.81);Muscle weakness (generalized) (M62.81);Difficulty in walking, not elsewhere classified (R26.2)     Time: 4270-6237 PT Time Calculation (min) (ACUTE ONLY): 34 min  Charges:  $Gait Training: 8-22 mins $Therapeutic Exercise: 8-22 mins                     3:47 PM, 04/13/21 Ocie Bob, MPT Physical Therapist with [facility] 336 [phone] office [phone] mobile phone

## 2021-04-14 DIAGNOSIS — D696 Thrombocytopenia, unspecified: Secondary | ICD-10-CM

## 2021-04-14 DIAGNOSIS — R791 Abnormal coagulation profile: Secondary | ICD-10-CM

## 2021-04-14 DIAGNOSIS — R188 Other ascites: Secondary | ICD-10-CM

## 2021-04-14 DIAGNOSIS — K7031 Alcoholic cirrhosis of liver with ascites: Secondary | ICD-10-CM

## 2021-04-14 DIAGNOSIS — K7210 Chronic hepatic failure without coma: Secondary | ICD-10-CM

## 2021-04-14 LAB — GLUCOSE, CAPILLARY
Glucose-Capillary: 163 mg/dL — ABNORMAL HIGH (ref 70–99)
Glucose-Capillary: 197 mg/dL — ABNORMAL HIGH (ref 70–99)
Glucose-Capillary: 215 mg/dL — ABNORMAL HIGH (ref 70–99)
Glucose-Capillary: 205 mg/dL — ABNORMAL HIGH (ref 70–99)

## 2021-04-14 LAB — BPAM FFP
Blood Product Expiration Date: 202301292359
Blood Product Expiration Date: 202301292359
ISSUE DATE / TIME: 202301241255
ISSUE DATE / TIME: 202301241500
Unit Type and Rh: 6200
Unit Type and Rh: 6200

## 2021-04-14 LAB — PREPARE FRESH FROZEN PLASMA: Unit division: 0

## 2021-04-14 LAB — COMPREHENSIVE METABOLIC PANEL
ALT: 18 U/L (ref 0–44)
AST: 47 U/L — ABNORMAL HIGH (ref 15–41)
Albumin: 3.1 g/dL — ABNORMAL LOW (ref 3.5–5.0)
Alkaline Phosphatase: 92 U/L (ref 38–126)
Anion gap: 6 (ref 5–15)
BUN: 15 mg/dL (ref 6–20)
CO2: 28 mmol/L (ref 22–32)
Calcium: 8.5 mg/dL — ABNORMAL LOW (ref 8.9–10.3)
Chloride: 103 mmol/L (ref 98–111)
Creatinine, Ser: <0.3 mg/dL — ABNORMAL LOW (ref 0.44–1.00)
Glucose, Bld: 185 mg/dL — ABNORMAL HIGH (ref 70–99)
Potassium: 3.7 mmol/L (ref 3.5–5.1)
Sodium: 137 mmol/L (ref 135–145)
Total Bilirubin: 14.5 mg/dL — ABNORMAL HIGH (ref 0.3–1.2)
Total Protein: 5.1 g/dL — ABNORMAL LOW (ref 6.5–8.1)

## 2021-04-14 LAB — CBC
HCT: 21.2 % — ABNORMAL LOW (ref 36.0–46.0)
Hemoglobin: 7 g/dL — ABNORMAL LOW (ref 12.0–15.0)
MCH: 33.3 pg (ref 26.0–34.0)
MCHC: 33 g/dL (ref 30.0–36.0)
MCV: 101 fL — ABNORMAL HIGH (ref 80.0–100.0)
Platelets: 37 10*3/uL — ABNORMAL LOW (ref 150–400)
RBC: 2.1 MIL/uL — ABNORMAL LOW (ref 3.87–5.11)
RDW: 23.8 % — ABNORMAL HIGH (ref 11.5–15.5)
WBC: 3.5 10*3/uL — ABNORMAL LOW (ref 4.0–10.5)
nRBC: 0 % (ref 0.0–0.2)

## 2021-04-14 LAB — PROTIME-INR
INR: 2.3 — ABNORMAL HIGH (ref 0.8–1.2)
Prothrombin Time: 24.9 seconds — ABNORMAL HIGH (ref 11.4–15.2)

## 2021-04-14 LAB — AMMONIA: Ammonia: 56 umol/L — ABNORMAL HIGH (ref 9–35)

## 2021-04-14 LAB — PREPARE RBC (CROSSMATCH)

## 2021-04-14 MED ORDER — FUROSEMIDE 10 MG/ML IJ SOLN
20.0000 mg | Freq: Two times a day (BID) | INTRAMUSCULAR | Status: DC
  Administered 2021-04-14 – 2021-04-15 (×2): 20 mg via INTRAVENOUS
  Filled 2021-04-14 (×2): qty 2

## 2021-04-14 MED ORDER — FUROSEMIDE 10 MG/ML IJ SOLN
40.0000 mg | Freq: Once | INTRAMUSCULAR | Status: AC
  Administered 2021-04-14: 17:00:00 40 mg via INTRAVENOUS
  Filled 2021-04-14: qty 4

## 2021-04-14 MED ORDER — SODIUM CHLORIDE 0.9% IV SOLUTION: Freq: Once | INTRAVENOUS | Status: AC

## 2021-04-14 NOTE — Progress Notes (Signed)
Subjective: Patient reports that she feels about the same this morning. She reports ongoing nausea and abdominal pain that is chronic. She feels that she has worsening fluid retention today, feels that LEs are more swollen and are painful as well. She is currently receiving her 4th blood transfusion this admission. She has some ongoing sob at baseline, no worse today.   Objective: Vital signs in last 24 hours: Temp:  [98.1 F (36.7 C)-98.7 F (37.1 C)] 98.7 F (37.1 C) (01/25 0559) Pulse Rate:  [81-90] 81 (01/25 0559) Resp:  [16-21] 21 (01/25 0559) BP: (112-135)/(47-75) 124/52 (01/25 0559) SpO2:  [93 %-100 %] 93 % (01/25 0559) Weight:  [67.6 kg] 67.6 kg (01/25 0500) Last BM Date: 04/14/21 General:   Alert and oriented, pleasant Head:  Normocephalic and atraumatic. Eyes:  icteric sclera Mouth:  Without lesions, mucosa pink and moist.  Heart:  S1, S2 present, no murmurs noted.  Lungs: Clear to auscultation bilaterally, without wheezing, rales, or rhonchi.  Abdomen:  Bowel sounds present, soft, diffusely tender, ascites present, full but not taut. +HSM. No rebound or guarding. No masses appreciated  Msk:  Symmetrical without gross deformities. Normal posture. Pulses:  Normal pulses noted. Extremities:  3+ pitting edema from feet up to thighs, bilaterally Neurologic:  Alert and  oriented x4;  grossly normal neurologically. No asterixis Skin:  jaundiced Psych:  Alert and cooperative. Normal mood and affect.  Intake/Output from previous day: 01/24 0701 - 01/25 0700 In: 1549 [P.O.:720; Blood:366; IV Piggyback:463] Out: -  Intake/Output this shift: Total I/O In: 240 [P.O.:240] Out: 150 [Urine:150]  Lab Results: Recent Labs    04/12/21 0424 04/13/21 0522 04/14/21 0537  WBC 4.1 3.0* 3.5*  HGB 8.4* 7.6* 7.0*  HCT 25.0* 22.9* 21.2*  PLT 45* 41* 37*   BMET Recent Labs    04/12/21 0424 04/13/21 0522 04/14/21 0537  NA 132* 135 137  K 3.6 3.7 3.7  CL 99 102 103  CO2 28 31  28   GLUCOSE 246* 281* 185*  BUN 17 16 15   CREATININE <0.30* <0.30* <0.30*  CALCIUM 8.1* 8.4* 8.5*   LFT Recent Labs    04/12/21 0424 04/13/21 0522 04/14/21 0537  PROT 4.5* 4.6* 5.1*  ALBUMIN 2.3* 2.5* 3.1*  AST 57* 55* 47*  ALT 21 21 18   ALKPHOS 87 81 92  BILITOT 16.0* 16.1*   16.2* 14.5*  BILIDIR  --  4.3*  --   IBILI  --  11.8*  --    PT/INR Recent Labs    04/13/21 1318 04/14/21 0537  LABPROT 28.9* 24.9*  INR 2.7* 2.3*   Studies/Results: ASCITES (ABDOMEN LIMITED)  Result Date: 04/13/2021 CLINICAL DATA:  Cirrhosis, ascites, ascites check for paracentesis EXAM: LIMITED ABDOMEN ULTRASOUND FOR ASCITES TECHNIQUE: Limited ultrasound survey for ascites was performed in all four abdominal quadrants. COMPARISON:  04/09/2021 FINDINGS: Only a small amount of perihepatic ascites is identified. Minimal ascites in RIGHT upper quadrant and RIGHT gutter. Insufficient ascites for paracentesis. Nodular hepatic margins consistent with history of cirrhosis. IMPRESSION: Insufficient ascites for paracentesis. Electronically Signed   By: [place] M.D.   On: 04/13/2021 15:21    Assessment: 56 year old female with history of cirrhosis suspected primarily NASH and component of ASH wth prior alcohol use.   Hyperammonemia:100 at peak, 56 today. likely multifactorial in settting of cirrhosis and acute infection. On lactulose 30g QID and XIfaxan BID with 2-3 BMs per day throughout admission. Reports 1 BM today that was more solid than previously,  patient denies any rectal bleeding or melena. She is a/o x4 today.   Hyperbilirubinemia:  20.7 on admission, down to 14.5 today. Also likely multifactorial in setting of cirrhosis and acute illness. Chronic unchanged abdominal pain. [place] this admission with stable dilated CBD at 1.1cm, post cholecystectomy. LFTs mostly normal other than slightly elevated AST. 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 Fractionated bilirubin yesterday with T bili 16.1, direct  bili 4.3 and indirect bili 11.8.  Cirrhosis:  Secondary to NASH?ASH with alcohol use previously. Follows with [facility], previously recommended against transplant given other comorbidities. No current alcohol use. Previously complicated by HE, thrombocytopenia, chronic thrombosis of portal vein, now with anasarca. Notably not on diuretics her first few days of admission, received IV fluids in setting of sepsis, gained 11 pounds since admission. Lasix continued at 20mg  BID and spironolactone increased to 100mg  daily yesterday. Has also received albumin this admission. Ascites yesterday with insufficient volume for paracentesis. 67.6 kg today, down from 71.1 kg. LFTs continue to improve, INR down to 2.3 from 2.7 yesterday. pitting edema to bilateral lower extremities and moderate ascites present today, though not likely enough for paracentesis.   GERD: uncontrolled, PPI daily started yesterday  Anemia:  Acute on chronic, hgb range around 10 at baseline, 7.8 on admission. Received 3 units PRBC this admission, with 4th infusing this morning for Hgb of 7 today. Stool heme negative on 1/17, last egd and colonoscopy within the last 5 years per patient. No overt GI bleeding  Dysphagia: long history of difficulty swallowing large pills, no issues with food or liquid, also with uncontrolled GERD, could be related to esophageal web, ring, stricture or underlying dysmotility, less likely malignancy, would benefit from EGD in near future.  Plan: Continue xifaxan 550 BID Spironolactone 100mg  daily Iv lasix 20mg  BID Daily weights 2g sodium diet Protonix 40mg  daily Continue creon Monitor for overt GI bleeding CBC, CMP, INR daily   LOS: 8 days    04/14/2021, 9:22 AM   Cord Wilczynski L. 2/17, MSN, APRN, AGNP-C Adult-Gerontology Nurse Practitioner [facility]

## 2021-04-14 NOTE — Progress Notes (Signed)
She denies PROGRESS NOTE   [name]  JQG:[id number] DOB: 08-07-1965 DOA: 04/05/2021 PCP: [facility]    Brief Narrative:  56 year old female with a history of cirrhosis, admitted to the hospital with confusion and lethargy.  Found to have sepsis from possible UTI.  She was also found to have positive blood cultures and is on antibiotics.  Ammonia is noted to be elevated she is on lactulose and Xifaxan.  She is also noted to be anemic and has been receiving PRBC transfusions.  For hypotension, she has required Levophed infusion which has since been weaned off.   Assessment & Plan:   Principal Problem:   Sepsis secondary to UTI [facility]) Active Problems:   Gastroesophageal reflux   Thrombocytopenia (HCC)   Alcoholic cirrhosis (HCC)   Cirrhosis ([place])   Dysphagia   Hyperbilirubinemia   Hypoalbuminemia due to protein-calorie malnutrition (HCC)   Generalized weakness   Chronic nausea   Gastro-esophageal reflux disease without esophagitis   Macrocytic anemia   Adult hypothyroidism   Supratherapeutic INR   Hyponatremia   Hyperglycemia due to diabetes mellitus (HCC)   Hyperammonemia (HCC)   Severe Sepsis -Related to ESBL E coli UTI -She had 2 sets of blood cultures drawn on admission.  3 out of 4 bottles positive for Staphylococcus simulans.  Discussed with ID Dr. Gale Journey who recommended repeating blood cultures.  If repeat cultures are negative, could consider positive cultures contaminants -Blood cultures repeated 04/10/21 currently no growth -She briefly required Levophed infusion, but has since been weaned off. -Lactic acid is trending down -she completed a total of 7 days of Meropenem for ESBL E coli  -No further antibiotics indicated at this time -Sepsis pathophysiology appears to have resolved  Symptomatic anemia --Hgb down to 7.0 -Transfused 1 units of PRBC on 04/14/2020 for total 4 units of PRBC this admission -FOBT negative -Staff reports continued bleeding  around her PICC line.  Since she is significantly thrombocytopenic, she received a unit of platelets -INR elevated (received 2 units FFP this admission) -continue to follow hemoglobin  Hepatic encephalopathy -Ammonia up to 94->46->76->100>> 56 -Having at least 3 BMs per day -Mentation improving -Continue lactulose and Xifaxan -GI consult appreciated  Thrombocytopenia -Related to underlying liver disease  NASH Cirrhosis -Appears to be decompensated -Transaminases are unremarkable -She is status post cholecystectomy -Right upper quadrant ultrasound shows stable CBD dilatation.  She does not have any right upper quadrant pain or vomiting. -Restarted on IV albumin and Lasix/Aldactone -Ultrasound abdomen did not show a significant amount of ascites for therapeutic paracentesis -Elevated bilirubin and elevated INR noted -GI input appreciated  Hypothyroidism -Continue Synthroid  Insulin dependent diabetes mellitus -Followed by [facility]ocrinology at [facility] -She appears to be on an insulin pump as an outpatient. -Currently on sliding scale insulin -Currently on basal insulin, will increase dose since she is persistently hyperglycemic -added NovoLog for meal coverage  Chronic respiratory failure with hypoxia -Chronically on 2 to 3 L of supplemental oxygen -Desaturates quickly without O2  Moderate Malnutrition -nutrition services following -Nutritional supplements advised   DVT prophylaxis: SCDs Start: 04/06/21 0834  Code Status: DNR Family Communication: Patient is awake and alert, she will update and significant other herself  disposition Plan: Possible discharge home on 04/15/2021 if clinically and lab data improved --Possible discharge on 04/15/2021 if improvement after transfusion and additional IV Lasix  Status is: Inpatient  Remains inpatient appropriate because: Continued IV antibiotics and management of encephalopathy.    Consultants:  GI  Procedures:  Antimicrobials:  Meropenem 1/17 >1/23 Vancomycin 1/18 > 1/18   Subjective: -Fatigue and edema improving -Tolerating transfusion of PRBC well -Had soft stools and voiding well -Fatigue and dyspnea on exertion persist, desaturates quickly when oxygen cannula falls off her nose -Possible discharge on 04/15/2021 if improvement after transfusion and additional IV Lasix  Objective: Vitals:   04/14/21 1026 04/14/21 1054 04/14/21 1226 04/14/21 1325  BP: (!) 144/61 (!) 130/52 (!) 135/58 (!) 152/69  Pulse: 81 73 81 84  Resp: 18 18 19 20   Temp: 98.2 F (36.8 C) 98.2 F (36.8 C) 98.2 F (36.8 C) 98.5 F (36.9 C)  TempSrc: Oral Oral Oral Oral  SpO2: 99% 96% 95% 95%  Weight:      Height:        Intake/Output Summary (Last 24 hours) at 04/14/2021 1706 Last data filed at 04/14/2021 1529 Gross per 24 hour  Intake 2850.19 ml  Output 150 ml  Net 2700.19 ml   Filed Weights   04/07/21 0543 04/13/21 0500 04/14/21 0500  Weight: 66 kg 71.1 kg 67.6 kg    Examination: General exam: Alert, awake, oriented x 3 Respiratory system: Clear to auscultation. Respiratory effort normal. Cardiovascular system:RRR. No murmurs, rubs, gallops. Gastrointestinal system: Abdomen is nondistended, soft and nontender.Normal bowel sounds heard. Central nervous system: Alert and oriented. No focal neurological deficits. Extremities: 1-2+ pitting edema bilaterally Skin: No rashes, lesions or ulcers Psychiatry: Judgement and insight appear normal. Mood & affect appropriate.    Data Reviewed: I have personally reviewed following labs and imaging studies  CBC: Recent Labs  Lab 04/10/21 0427 04/11/21 0544 04/12/21 0424 04/13/21 0522 04/14/21 0537  WBC 4.6 4.3 4.1 3.0* 3.5*  HGB 7.5* 6.9* 8.4* 7.6* 7.0*  HCT 22.2* 20.9* 25.0* 22.9* 21.2*  MCV 101.8* 100.0 97.7 99.1 101.0*  PLT 32* 34* 45* 41* 37*   Basic Metabolic Panel: Recent Labs  Lab 04/08/21 0443 04/09/21 0352 04/10/21 0427 04/11/21 0544  04/12/21 0424 04/13/21 0522 04/14/21 0537  NA 134*   < > 134* 133* 132* 135 137  K 3.6   < > 3.2* 3.4* 3.6 3.7 3.7  CL 100   < > 101 101 99 102 103  CO2 28   < > 28 27 28 31 28   GLUCOSE 227*   < > 271* 202* 246* 281* 185*  BUN 33*   < > 24* 22* 17 16 15   CREATININE 0.67   < > 0.33* <0.30* <0.30* <0.30* <0.30*  CALCIUM 8.4*   < > 8.4* 8.1* 8.1* 8.4* 8.5*  MG 1.8  --   --  1.5* 1.7  --   --   PHOS 2.3*  --   --  2.3* 3.2  --   --    < > = values in this interval not displayed.   GFR: CrCl cannot be calculated (This lab value cannot be used to calculate CrCl because it is not a number: <0.30). Liver Function Tests: Recent Labs  Lab 04/10/21 0427 04/11/21 0544 04/12/21 0424 04/13/21 0522 04/14/21 0537  AST 46* 51* 57* 55* 47*  ALT 17 19 21 21 18   ALKPHOS 76 84 87 81 92  BILITOT 13.1* 12.9* 16.0* 16.1*   16.2* 14.5*  PROT 4.2* 4.1* 4.5* 4.6* 5.1*  ALBUMIN 2.1* 2.1* 2.3* 2.5* 3.1*   No results for input(s): LIPASE, AMYLASE in the last 168 hours.  Recent Labs  Lab 04/08/21 0443 04/10/21 1630 04/11/21 0544 04/13/21 0522 04/14/21 0537  AMMONIA 46* 76* 74* 100*  56*   Coagulation Profile: Recent Labs  Lab 04/12/21 0424 04/13/21 1318 04/14/21 0537  INR 2.6* 2.7* 2.3*   Cardiac Enzymes: No results for input(s): CKTOTAL, CKMB, CKMBINDEX, TROPONINI in the last 168 hours. BNP (last 3 results) No results for input(s): PROBNP in the last 8760 hours. HbA1C: No results for input(s): HGBA1C in the last 72 hours. CBG: Recent Labs  Lab 04/13/21 1618 04/13/21 2149 04/14/21 0739 04/14/21 1128 04/14/21 1643  GLUCAP 201* 178* 163* 197* 215*   Lipid Profile: No results for input(s): CHOL, HDL, LDLCALC, TRIG, CHOLHDL, LDLDIRECT in the last 72 hours. Thyroid Function Tests: No results for input(s): TSH, T4TOTAL, FREET4, T3FREE, THYROIDAB in the last 72 hours. Anemia Panel: Recent Labs    04/13/21 0522  VITAMINB12 931*  FERRITIN 234  TIBC 120*  IRON 113    Sepsis  Labs: No results for input(s): PROCALCITON, LATICACIDVEN in the last 168 hours.   Recent Results (from the past 240 hour(s))  Urine Culture     Status: Abnormal   Collection Time: 04/05/21 12:36 AM   Specimen: Urine, Clean Catch  Result Value Ref Range Status   Specimen Description   Final    URINE, CLEAN CATCH Performed at [facility], [address], [place], [place] [address]    Special Requests   Final    NONE Performed at [facility], [address], [place], [place] [address]    Culture (A)  Final    50,000 COLONIES/mL ESCHERICHIA COLI Confirmed Extended Spectrum Beta-Lactamase Producer (ESBL).  In bloodstream infections from ESBL organisms, carbapenems are preferred over piperacillin/tazobactam. They are shown to have a lower risk of mortality.    Report Status 04/08/2021 FINAL  Final   Organism ID, Bacteria ESCHERICHIA COLI (A)  Final      Susceptibility   Escherichia coli - MIC*    AMPICILLIN >=32 RESISTANT Resistant     CEFAZOLIN >=64 RESISTANT Resistant     CEFEPIME 16 RESISTANT Resistant     CEFTRIAXONE >=64 RESISTANT Resistant     CIPROFLOXACIN >=4 RESISTANT Resistant     GENTAMICIN <=1 SENSITIVE Sensitive     IMIPENEM <=0.25 SENSITIVE Sensitive     NITROFURANTOIN <=16 SENSITIVE Sensitive     TRIMETH/SULFA <=20 SENSITIVE Sensitive     AMPICILLIN/SULBACTAM >=32 RESISTANT Resistant     PIP/TAZO 32 INTERMEDIATE Intermediate     * 50,000 COLONIES/mL ESCHERICHIA COLI  Resp Panel by RT-PCR (Flu A&B, Covid) Nasopharyngeal Swab     Status: None   Collection Time: 04/05/21 10:15 PM   Specimen: Nasopharyngeal Swab; Nasopharyngeal(NP) swabs in vial transport medium  Result Value Ref Range Status   SARS Coronavirus 2 by RT PCR NEGATIVE NEGATIVE Final    Comment: (NOTE) SARS-CoV-2 target nucleic acids are NOT DETECTED.  The SARS-CoV-2 RNA is generally detectable in upper respiratory specimens during the acute phase of infection. The lowest concentration of SARS-CoV-2  viral copies this assay can detect is 138 copies/mL. A negative result does not preclude SARS-Cov-2 infection and should not be used as the sole basis for treatment or other patient management decisions. A negative result may occur with  improper specimen collection/handling, submission of specimen other than nasopharyngeal swab, presence of viral mutation(s) within the areas targeted by this assay, and inadequate number of viral copies(<138 copies/mL). A negative result must be combined with clinical observations, patient history, and epidemiological information. The expected result is Negative.  Fact Sheet for Patients:  EntrepreneurPulse.com.au  Fact Sheet for Healthcare Providers:  IncredibleEmployment.be  This test is no t yet approved or cleared by the [place] and  has been authorized for detection and/or diagnosis of SARS-CoV-2 by FDA under an Emergency Use Authorization (EUA). This EUA will remain  in effect (meaning this test can be used) for the duration of the COVID-19 declaration under Section 564(b)(1) of the Act, 21 U.S.C.section 360bbb-3(b)(1), unless the authorization is terminated  or revoked sooner.       Influenza A by PCR NEGATIVE NEGATIVE Final   Influenza B by PCR NEGATIVE NEGATIVE Final    Comment: (NOTE) The Xpert Xpress SARS-CoV-2/FLU/RSV plus assay is intended as an aid in the diagnosis of influenza from Nasopharyngeal swab specimens and should not be used as a sole basis for treatment. Nasal washings and aspirates are unacceptable for Xpert Xpress SARS-CoV-2/FLU/RSV testing.  Fact Sheet for Patients: EntrepreneurPulse.com.au  Fact Sheet for Healthcare Providers: IncredibleEmployment.be  This test is not yet approved or cleared by the [place] FDA and has been authorized for detection and/or diagnosis of SARS-CoV-2 by FDA under an Emergency Use Authorization  (EUA). This EUA will remain in effect (meaning this test can be used) for the duration of the COVID-19 declaration under Section 564(b)(1) of the Act, 21 U.S.C. section 360bbb-3(b)(1), unless the authorization is terminated or revoked.  Performed at [facility], [address] [place], [place] [address]   Blood Culture (routine x 2)     Status: Abnormal   Collection Time: 04/05/21 10:39 PM   Specimen: Left Antecubital; Blood  Result Value Ref Range Status   Specimen Description   Final    LEFT ANTECUBITAL Performed at [facility], [address] [place], [place] [address]    Special Requests   Final    BOTTLES DRAWN AEROBIC AND ANAEROBIC Blood Culture adequate volume Performed at [facility], [address] [place], [place] [address]    Culture  Setup Time   Final    GRAM POSITIVE COCCI IN CLUSTERS IN BOTH AEROBIC AND ANAEROBIC BOTTLES CALL PREVIOUS TO ICU FOILEY, B. Organism ID to follow CRITICAL RESULT CALLED TO, READ BACK BY AND VERIFIED WITH: JESSICA HEARN RN 04/06/21 @2039  BY JW Performed at [facility], [place] [address], [place], [place] [address]    Culture STAPHYLOCOCCUS SIMULANS (A)  Final   Report Status 04/08/2021 FINAL  Final   Organism ID, Bacteria STAPHYLOCOCCUS SIMULANS  Final      Susceptibility   Staphylococcus simulans - MIC*    CIPROFLOXACIN <=0.5 SENSITIVE Sensitive     ERYTHROMYCIN >=8 RESISTANT Resistant     GENTAMICIN <=0.5 SENSITIVE Sensitive     OXACILLIN <=0.25 SENSITIVE Sensitive     TETRACYCLINE <=1 SENSITIVE Sensitive     VANCOMYCIN <=0.5 SENSITIVE Sensitive     TRIMETH/SULFA <=10 SENSITIVE Sensitive     CLINDAMYCIN >=8 RESISTANT Resistant     RIFAMPIN 16 RESISTANT Resistant     Inducible Clindamycin NEGATIVE Sensitive     * STAPHYLOCOCCUS SIMULANS  Blood Culture (routine x 2)     Status: Abnormal   Collection Time: 04/05/21 10:39 PM   Specimen: BLOOD RIGHT ARM  Result Value Ref Range Status   Specimen Description   Final    BLOOD  RIGHT ARM Performed at [facility], [address] [place], [place] [address]    Special Requests   Final    BOTTLES DRAWN AEROBIC ONLY Blood Culture adequate volume Performed at [facility], [address] [place], [place] [address]    Culture  Setup  Time   Final    GRAM POSITIVE COCCI IN CLUSTERS AEROBIC BOTTLE ONLY  RESULTS CALLED AND READ BACK FROM FOILY, B AT 1400 ON 04/06/21 BY BROWNING, D.    Culture (A)  Final    STAPHYLOCOCCUS SIMULANS SUSCEPTIBILITIES PERFORMED ON PREVIOUS CULTURE WITHIN THE LAST 5 DAYS. Performed at [facility], [place] [address] [place], [place] [address]    Report Status 04/08/2021 FINAL  Final  Blood Culture ID Panel (Reflexed)     Status: Abnormal   Collection Time: 04/05/21 10:39 PM  Result Value Ref Range Status   Enterococcus faecalis NOT DETECTED NOT DETECTED Final   Enterococcus Faecium NOT DETECTED NOT DETECTED Final   Listeria monocytogenes NOT DETECTED NOT DETECTED Final   Staphylococcus species DETECTED (A) NOT DETECTED Final    Comment: CRITICAL RESULT CALLED TO, READ BACK BY AND VERIFIED WITH: JESSICA HEARN RN 04/06/21 @2039  BY JW    Staphylococcus aureus (BCID) NOT DETECTED NOT DETECTED Final   Staphylococcus epidermidis NOT DETECTED NOT DETECTED Final   Staphylococcus lugdunensis NOT DETECTED NOT DETECTED Final   Streptococcus species NOT DETECTED NOT DETECTED Final   Streptococcus agalactiae NOT DETECTED NOT DETECTED Final   Streptococcus pneumoniae NOT DETECTED NOT DETECTED Final   Streptococcus pyogenes NOT DETECTED NOT DETECTED Final   A.calcoaceticus-baumannii NOT DETECTED NOT DETECTED Final   Bacteroides fragilis NOT DETECTED NOT DETECTED Final   Enterobacterales NOT DETECTED NOT DETECTED Final   Enterobacter cloacae complex NOT DETECTED NOT DETECTED Final   Escherichia coli NOT DETECTED NOT DETECTED Final   Klebsiella aerogenes NOT DETECTED NOT DETECTED Final   Klebsiella oxytoca NOT DETECTED NOT DETECTED Final    Klebsiella pneumoniae NOT DETECTED NOT DETECTED Final   Proteus species NOT DETECTED NOT DETECTED Final   Salmonella species NOT DETECTED NOT DETECTED Final   Serratia marcescens NOT DETECTED NOT DETECTED Final   Haemophilus influenzae NOT DETECTED NOT DETECTED Final   Neisseria meningitidis NOT DETECTED NOT DETECTED Final   Pseudomonas aeruginosa NOT DETECTED NOT DETECTED Final   Stenotrophomonas maltophilia NOT DETECTED NOT DETECTED Final   Candida albicans NOT DETECTED NOT DETECTED Final   Candida auris NOT DETECTED NOT DETECTED Final   Candida glabrata NOT DETECTED NOT DETECTED Final   Candida krusei NOT DETECTED NOT DETECTED Final   Candida parapsilosis NOT DETECTED NOT DETECTED Final   Candida tropicalis NOT DETECTED NOT DETECTED Final   Cryptococcus neoformans/gattii NOT DETECTED NOT DETECTED Final    Comment: Performed at [facility], [address] [place], [place] [address]  MRSA Next Gen by PCR, Nasal     Status: None   Collection Time: 04/06/21  8:45 AM   Specimen: Nasal Mucosa; Nasal Swab  Result Value Ref Range Status   MRSA by PCR Next Gen NOT DETECTED NOT DETECTED Final    Comment: (NOTE) The GeneXpert MRSA Assay (FDA approved for NASAL specimens only), is one component of a comprehensive MRSA colonization surveillance program. It is not intended to diagnose MRSA infection nor to guide or monitor treatment for MRSA infections. Test performance is not FDA approved in patients less than 49 years old. Performed at [facility], [address] [place], [place] [address]   Culture, blood (routine x 2)     Status: None (Preliminary result)   Collection Time: 04/10/21  4:58 PM   Specimen: Left Antecubital; Blood  Result Value Ref Range Status   Specimen Description   Final    LEFT ANTECUBITAL BOTTLES DRAWN AEROBIC AND  ANAEROBIC   Special Requests   Final    Blood Culture results may not be optimal due to an excessive volume of blood received in culture bottles    Culture   Final    NO GROWTH 4 DAYS Performed at [facility], [address], [place], [place] [address]    Report Status PENDING  Incomplete  Culture, blood (routine x 2)     Status: None (Preliminary result)   Collection Time: 04/10/21  5:09 PM   Specimen: BLOOD LEFT HAND  Result Value Ref Range Status   Specimen Description   Final    BLOOD LEFT HAND BOTTLES DRAWN AEROBIC AND ANAEROBIC   Special Requests   Final    Blood Culture results may not be optimal due to an excessive volume of blood received in culture bottles   Culture   Final    NO GROWTH 4 DAYS Performed at [facility], [address], [place], [place] [address]    Report Status PENDING  Incomplete         Radiology Studies: [place] ASCITES (ABDOMEN LIMITED)  Result Date: 04/13/2021 CLINICAL DATA:  Cirrhosis, ascites, ascites check for paracentesis EXAM: LIMITED ABDOMEN ULTRASOUND FOR ASCITES TECHNIQUE: Limited ultrasound survey for ascites was performed in all four abdominal quadrants. COMPARISON:  04/09/2021 FINDINGS: Only a small amount of perihepatic ascites is identified. Minimal ascites in RIGHT upper quadrant and RIGHT gutter. Insufficient ascites for paracentesis. Nodular hepatic margins consistent with history of cirrhosis. IMPRESSION: Insufficient ascites for paracentesis. Electronically Signed   By: Lavonia Dana M.D.   On: 04/13/2021 15:21     Scheduled Meds:  sodium chloride   Intravenous Once   Chlorhexidine Gluconate Cloth  6 each Topical Daily   feeding supplement (GLUCERNA SHAKE)  237 mL Oral TID BM   furosemide  20 mg Intravenous Q12H   furosemide  40 mg Intravenous Once   insulin aspart  0-5 Units Subcutaneous QHS   insulin aspart  0-9 Units Subcutaneous TID WC   insulin aspart  4 Units Subcutaneous TID WC   insulin glargine-yfgn  15 Units Subcutaneous QHS   lactulose  30 g Oral QID   levothyroxine  50 mcg Oral QAC breakfast   lipase/protease/amylase  48,000 Units Oral TID AC   pantoprazole  40 mg  Oral Daily   rifaximin  550 mg Oral BID   sodium chloride flush  10-40 mL Intracatheter Q12H   spironolactone  100 mg Oral Daily   Continuous Infusions:  albumin human 60 mL/hr at 04/14/21 1529     LOS: 8 days   Roxan Hockey, MD [facility]   If 7PM-7AM, please contact night-coverage [email or web address]  04/14/2021, 5:06 PM

## 2021-04-14 NOTE — Progress Notes (Signed)
Patients tele monitor alarming apnea, assessed patient noted patient lying in bed resting. Vital signs: T-99.0, P-80, R-20, BP-135/32, O2-95% at 2 liters. Adjusted patient tele monitor leads. MD Courage and charge nurse made aware.

## 2021-04-14 NOTE — Progress Notes (Signed)
Patient alert and verbal, ambulated to the bed, chair and BSC this shift. Patient received 1 unit of blood this shift with no complications. PICC line intact, noted no bleeding this shift. Patient had one episode of nausea this shift, given IV Zofran, which decreased nausea. Patient reported no complaints of pain at this time.

## 2021-04-14 NOTE — Progress Notes (Signed)
PT Cancellation Note  Patient Details Name: [name] MRN: [id number] DOB: 1966/02/02   Cancelled Treatment:    Reason Eval/Treat Not Completed: Patient at procedure or test/unavailable Held therapy today, pt receiving blood.   Ihor Austin, LPTA/CLT; CBIS [phone] [name] 04/14/2021, 11:26 AM

## 2021-04-15 LAB — COMPREHENSIVE METABOLIC PANEL
ALT: 17 U/L (ref 0–44)
AST: 44 U/L — ABNORMAL HIGH (ref 15–41)
Albumin: 3.5 g/dL (ref 3.5–5.0)
Alkaline Phosphatase: 77 U/L (ref 38–126)
Anion gap: 7 (ref 5–15)
BUN: 17 mg/dL (ref 6–20)
CO2: 29 mmol/L (ref 22–32)
Calcium: 9.2 mg/dL (ref 8.9–10.3)
Chloride: 103 mmol/L (ref 98–111)
Creatinine, Ser: <0.3 mg/dL — ABNORMAL LOW (ref 0.44–1.00)
Glucose, Bld: 176 mg/dL — ABNORMAL HIGH (ref 70–99)
Potassium: 3.5 mmol/L (ref 3.5–5.1)
Sodium: 139 mmol/L (ref 135–145)
Total Bilirubin: 17.9 mg/dL — ABNORMAL HIGH (ref 0.3–1.2)
Total Protein: 5.5 g/dL — ABNORMAL LOW (ref 6.5–8.1)

## 2021-04-15 LAB — CULTURE, BLOOD (ROUTINE X 2)
Culture: NO GROWTH
Culture: NO GROWTH

## 2021-04-15 LAB — GLUCOSE, CAPILLARY
Glucose-Capillary: 184 mg/dL — ABNORMAL HIGH (ref 70–99)
Glucose-Capillary: 305 mg/dL — ABNORMAL HIGH (ref 70–99)

## 2021-04-15 LAB — CBC
HCT: 23.1 % — ABNORMAL LOW (ref 36.0–46.0)
Hemoglobin: 7.6 g/dL — ABNORMAL LOW (ref 12.0–15.0)
MCH: 32.6 pg (ref 26.0–34.0)
MCHC: 32.9 g/dL (ref 30.0–36.0)
MCV: 99.1 fL (ref 80.0–100.0)
Platelets: 40 10*3/uL — ABNORMAL LOW (ref 150–400)
RBC: 2.33 MIL/uL — ABNORMAL LOW (ref 3.87–5.11)
RDW: 24.2 % — ABNORMAL HIGH (ref 11.5–15.5)
WBC: 4.5 10*3/uL (ref 4.0–10.5)
nRBC: 0 % (ref 0.0–0.2)

## 2021-04-15 LAB — TYPE AND SCREEN
ABO/RH(D): A POS
Antibody Screen: NEGATIVE
Unit division: 0
Unit division: 0

## 2021-04-15 LAB — BPAM RBC
Blood Product Expiration Date: 202301272359
Blood Product Expiration Date: 202302182359
ISSUE DATE / TIME: 202301221039
ISSUE DATE / TIME: 202301251032
Unit Type and Rh: 600
Unit Type and Rh: 6200

## 2021-04-15 LAB — PREPARE RBC (CROSSMATCH)

## 2021-04-15 MED ORDER — SPIRONOLACTONE 100 MG PO TABS: 100.0000 mg | ORAL_TABLET | Freq: Every day | ORAL | 5 refills | Status: DC

## 2021-04-15 MED ORDER — PANTOPRAZOLE SODIUM 40 MG PO TBEC: 40.0000 mg | DELAYED_RELEASE_TABLET | Freq: Every day | ORAL | 3 refills | Status: AC

## 2021-04-15 MED ORDER — FUROSEMIDE 20 MG PO TABS: 20.0000 mg | ORAL_TABLET | Freq: Two times a day (BID) | ORAL | 3 refills | Status: DC

## 2021-04-15 MED ORDER — PANCRELIPASE (LIP-PROT-AMYL) 12000-38000 UNITS PO CPEP: 48000.0000 [IU] | ORAL_CAPSULE | Freq: Three times a day (TID) | ORAL | 4 refills | Status: AC

## 2021-04-15 MED ORDER — ALBUTEROL SULFATE (2.5 MG/3ML) 0.083% IN NEBU: 2.5000 mg | INHALATION_SOLUTION | Freq: Four times a day (QID) | RESPIRATORY_TRACT | 12 refills | Status: AC | PRN

## 2021-04-15 MED ORDER — MAGNESIUM 400 MG PO TABS: 400.0000 mg | ORAL_TABLET | Freq: Every day | ORAL | 3 refills | Status: DC

## 2021-04-15 MED ORDER — POTASSIUM CHLORIDE CRYS ER 20 MEQ PO TBCR: 20.0000 meq | EXTENDED_RELEASE_TABLET | Freq: Two times a day (BID) | ORAL | 2 refills | Status: AC

## 2021-04-15 MED ORDER — ALBUTEROL SULFATE HFA 108 (90 BASE) MCG/ACT IN AERS: 2.0000 | INHALATION_SPRAY | RESPIRATORY_TRACT | 2 refills | Status: AC | PRN

## 2021-04-15 MED ORDER — FUROSEMIDE 10 MG/ML IJ SOLN
40.0000 mg | Freq: Once | INTRAMUSCULAR | Status: AC
  Administered 2021-04-15: 40 mg via INTRAVENOUS
  Filled 2021-04-15: qty 4

## 2021-04-15 MED ORDER — SODIUM CHLORIDE 0.9% IV SOLUTION: Freq: Once | INTRAVENOUS | Status: AC

## 2021-04-15 NOTE — Progress Notes (Signed)
Patient was ambulated in the hallway oxygen saturation dropped to 87% at 3 liters, patient stated she could not tolerate walking anymore. MD Courage made aware.

## 2021-04-15 NOTE — Progress Notes (Signed)
Obtained patients patients 15 min. Post blood start vital signs BP was 126/68, R-16 prior to blood start, now BP-147/60, R-22. MD Courage made aware. No new orders.

## 2021-04-15 NOTE — Progress Notes (Signed)
Patient discharged home today, transported home by significant other. Discharge paperwork went over with patient, patient verbalized understanding. Belongings sent home with patient.

## 2021-04-15 NOTE — Progress Notes (Signed)
PICC line removed, total length of 40 cm, dressing applied, pt tolerated well,

## 2021-04-15 NOTE — TOC Transition Note (Signed)
Transition of Care [facility]) - CM/SW Discharge Note   Patient Details  Name: [name] MRN: [id number] Date of Birth: May 15, 1965  Transition of Care [facility]) CM/SW Contact:  Leitha Bleak, RN Phone Number: 04/15/2021, 10:24 AM   Clinical Narrative:   Patient will discharge home today. Denyse Amass with [facility] updated.   Final next level of care: Home w Home Health Services Barriers to Discharge: Barriers Resolved  Patient Goals and CMS Choice Patient states their goals for this hospitalization and ongoing recovery are:: to return home. CMS Medicare.gov Compare Post Acute Care list provided to:: Patient Choice offered to / list presented to : Patient  Discharge Placement    Patient and family notified of of transfer: 04/15/21  Discharge Plan and Services        HH Arranged: PT [facility] Agency: [facility] Date [facility] Agency Contacted: 04/06/21 Time HH Agency Contacted: 1151 Representative spoke with at [facility] Agency: Denyse Amass  Readmission Risk Interventions Readmission Risk Prevention Plan 04/06/2021 07/27/2020 07/20/2020  Transportation Screening Complete - Complete  PCP or Specialist Appt within 3-5 Days - Complete -  HRI or Home Care Consult Complete - Complete  Social Work Consult for Recovery Care Planning/Counseling Complete - Complete  Palliative Care Screening Not Applicable - Not Applicable  Medication Review [employer]) Complete - Complete  Some recent data might be hidden

## 2021-04-15 NOTE — Care Management Important Message (Signed)
Important Message  Patient Details  Name: [name] MRN: [id number] Date of Birth: 09-Dec-1965   Medicare Important Message Given:  Yes (spoke with SAmuel Hodgins(significant other) at [phone], no additional copy needed.)     Corey Harold 04/15/2021, 12:37 PM

## 2021-04-15 NOTE — Progress Notes (Signed)
Patient's PICC line was bleeding at insertion, line dressing was saturated with blood. Dressing, securement device changed and pressure dressing with folded gauze and kerlix was placed. Dressing is clean and dry and no bleeding noted after care was given. Patient stable, will continue to monitor.

## 2021-04-15 NOTE — Progress Notes (Signed)
Patient to be discharged today. GI signing off. Needs to follow up with primary GI at [facility].

## 2021-04-15 NOTE — Discharge Summary (Signed)
[name], is a 56 y.o. female  DOB 1965-07-01  MRN [id number].  Admission date:  04/05/2021  Admitting Physician  Bernadette Hoit, DO  Discharge Date:  04/15/2021   Primary MD  [facility] [place]  Recommendations for primary care physician for things to follow:   1)Avoid ibuprofen/Advil/Aleve/Motrin/Goody Powders/Naproxen/BC powders/Meloxicam/Diclofenac/Indomethacin and other Nonsteroidal anti-inflammatory medications as these will make you more likely to bleed and can cause stomach ulcers, can also cause Kidney problems.   2)Repeat CBC and CMP in 1 week--- at [facility] (with PCP)  3)You need oxygen at home at 2 L via nasal cannula continuously while awake and while asleep--- , please use up to 4L/min of oxygen with activity----- having open fires around oxygen can cause fire, significant injury and death  4)Follow up with gastroenterologist at [facility]  5)Very low-salt diet advised  6)Weigh yourself daily, call if you gain more than 3 pounds in 1 day or more than 5 pounds in 1 week as your diuretic medications may need to be adjusted   Admission Diagnosis  Cholangitis [K83.09] Sepsis due to undetermined organism [facility]) [A41.9] Chronic liver failure without hepatic coma ([place]) [K72.10] Sepsis with acute organ dysfunction and septic shock, due to unspecified organism, unspecified type ([place]) [A41.9, R65.21]   Discharge Diagnosis  Cholangitis [K83.09] Sepsis due to undetermined organism ([place]) [A41.9] Chronic liver failure without hepatic coma ([place]) [K72.10] Sepsis with acute organ dysfunction and septic shock, due to unspecified organism, unspecified type ([place]) [A41.9, R65.21]    Principal Problem:   Sepsis secondary to UTI ([place]) Active Problems:   Gastroesophageal reflux   Thrombocytopenia (HCC)   Alcoholic cirrhosis ([place])   Cirrhosis ([place])   Dysphagia   Hyperbilirubinemia    Hypoalbuminemia due to protein-calorie malnutrition ([place])   Generalized weakness   Chronic nausea   Gastro-esophageal reflux disease without esophagitis   Macrocytic anemia   Adult hypothyroidism   Supratherapeutic INR   Hyponatremia   Hyperglycemia due to diabetes mellitus ([place])   Hyperammonemia ([place])      Past Medical History:  Diagnosis Date   Degenerative joint disease    Diabetes mellitus without complication ([place])    Gastroesophageal reflux    Pancreatitis    Panic attack    Sleep apnea    Thrombocytopenia ([place])     Past Surgical History:  Procedure Laterality Date   CHOLECYSTECTOMY     SHOULDER SURGERY     left    tongue sx. titanium screw placed to hold tongue down     TOTAL THYROIDECTOMY       HPI  from the history and physical done on the day of admission:   Chief Complaint: Confusion and lethargy    HPI: [name] is a 56 y.o. female with medical history significant for NASH cirrhosis, type 2 diabetes mellitus, hypothyroidism, thrombocytopenia, GERD who presents to the emergency department via EMS due to several day history of lethargy and confusion.  Patient states that she has been sleeping a lot since Sunday (2 days ago)  and not taking her medications including lactulose, she states that she was without energy and that she has not been eating either.  She complained of some abdominal pain due to abdominal distention.  Patient states that she was recently diagnosed with UTI at [facility] and was prescribed with Macrobid which she was not sure if she was taking since 2 days ago when she was so weak and sleepy.  Her significant other was concerned of her lethargy and confusion, so he insisted that patient should go to the ED for further evaluation and management.  She denies chest pain, shortness of breath, fever, chills, nausea, vomiting   ED Course:  In the emergency department, she was febrile, tachycardic, tachypneic, BP was soft at 87/41 with a MAP of  56.  Work-up in the ED showed macrocytic anemia with H/H of 7.8/24.9, this was 10.4/33.2 on 03/18/2021.  BMP showed hyponatremia and hyperglycemia, INR was elevated at 3.4, albumin 1.8. Lactic acid 7.8 > 7.2, urinalysis was positive for UTI.  ABG showed hypoxia, ammonia was normal at 31.  Influenza A, B, SARS coronavirus 2 was negative. Chest x-ray showed Increasing congestive failure without definitive edema. Mild left basilar atelectasis. Patient was empirically treated with IV ceftriaxone and azithromycin.   Review of Systems: A full 10 point Review of Systems was done, except as stated above, all other Review of systems were negative.         Hospital Course:   Brief Narrative:  56 year old female with a history of cirrhosis, admitted to the hospital with confusion and lethargy.  Found to have sepsis from possible UTI.  She was also found to have positive blood cultures and is on antibiotics.  Ammonia is noted to be elevated she is on lactulose and Xifaxan.  She is also noted to be anemic and has been receiving PRBC transfusions.  For hypotension, she has required Levophed infusion which has since been weaned off.     A/p Severe Sepsis -Related to ESBL E coli UTI -She had 2 sets of blood cultures drawn on admission.  3 out of 4 bottles positive for Staphylococcus simulans.  Discussed with ID Dr. Gale Journey who recommended repeating blood cultures.  If repeat cultures are negative, could consider positive cultures contaminants -Blood cultures repeated 04/10/21 currently no growth -She briefly required Levophed infusion, but has since been weaned off. -Lactic acid is trending down -she completed a total of 7 days of Meropenem for ESBL E coli  -No further antibiotics indicated at this time -Sepsis pathophysiology has resolved   Symptomatic anemia --Hgb down to 7.0 -Transfused another units of PRBC on 04/15/2020 for total 5 units of PRBC this admission -FOBT negative -Staff reports continued  bleeding around her PICC line.--PICC line removed  -INR elevated (received 2 units FFP this admission) -Repeat CBC as outpatient within a week advised   Hepatic encephalopathy -Ammonia up to 94->46->76->100>> 56 -Having at least 3 BMs per day -Mentation is back to baseline according to patient's significant other who is at bedside -Continue lactulose and Xifaxan -GI consult appreciated   Thrombocytopenia -Related to underlying liver disease -Platelets stable around 40 K   NASH Cirrhosis -Transaminases are unremarkable -She is status post cholecystectomy -Right upper quadrant ultrasound shows stable CBD dilatation.  She does not have any right upper quadrant pain or vomiting. -Restarted on IV albumin and Lasix/Aldactone--- okay to discharge home on furosemide/Aldactone with potassium supplements -Ultrasound abdomen did not show a significant amount of ascites for therapeutic paracentesis -Elevated bilirubin and  elevated INR noted -GI input appreciated   Hypothyroidism -Continue Synthroid   Insulin dependent diabetes mellitus -Followed by endocrinology at [facility] -She appears to be on an insulin pump as an outpatient.   Chronic respiratory failure with hypoxia -Chronically on 2 to 4 L of supplemental oxygen At rest patient only requires 2 L of oxygen via nasal cannula with activity, patient has desaturation on 2 L but does well on 3 to 4 L via nasal cannula   Moderate Malnutrition -Nutritional supplements advised       Code Status: DNR disposition Plan: Possible discharge home   Discharge Condition: Stable  Follow UP   Follow-up Information     [facility] Follow up.   Specialty: Home Health Services Why: PT will call to schedule your first home visit. Contact information: [address] [place] [address] [phone]                Diet and Activity recommendation:  As advised  Discharge Instructions    Discharge  Instructions     Call MD for:  difficulty breathing, headache or visual disturbances   Complete by: As directed    Call MD for:  persistant dizziness or light-headedness   Complete by: As directed    Call MD for:  persistant nausea and vomiting   Complete by: As directed    Call MD for:  temperature >100.4   Complete by: As directed    Diet - low sodium heart healthy   Complete by: As directed    Diet Carb Modified   Complete by: As directed    Discharge instructions   Complete by: As directed    1)Avoid ibuprofen/Advil/Aleve/Motrin/Goody Powders/Naproxen/BC powders/Meloxicam/Diclofenac/Indomethacin and other Nonsteroidal anti-inflammatory medications as these will make you more likely to bleed and can cause stomach ulcers, can also cause Kidney problems.   2)Repeat CBC and CMP in 1 week--- at [facility] (with PCP)  3)You need oxygen at home at 2 L via nasal cannula continuously while awake and while asleep--- , please use up to 4L/min of oxygen with activity----- having open fires around oxygen can cause fire, significant injury and death  4)Follow up with gastroenterologist at [facility]  5)Very low-salt diet advised  6)Weigh yourself daily, call if you gain more than 3 pounds in 1 day or more than 5 pounds in 1 week as your diuretic medications may need to be adjusted   Increase activity slowly   Complete by: As directed        Discharge Medications     Allergies as of 04/15/2021       Reactions   Acetaminophen Hives, Other (See Comments)   Tolerates Fioricet   Cephalexin Itching, Swelling, Other (See Comments)   Tongue swells and makes throat itchy- can tolerate Zosyn   Ciprofloxacin Hives   Hydromorphone Hives   Sulfamethoxazole-trimethoprim Shortness Of Breath, Swelling, Other (See Comments)   Tongue swells   Tizanidine Anaphylaxis   Abilify [aripiprazole]    "I did noyt like the way this made me feel."        Medication List     STOP taking these  medications    cyclobenzaprine 5 MG tablet Commonly known as: FLEXERIL   nitrofurantoin (macrocrystal-monohydrate) 100 MG capsule Commonly known as: MACROBID   pregabalin 75 MG capsule Commonly known as: LYRICA       TAKE these medications    albuterol (2.5 MG/3ML) 0.083% nebulizer solution Commonly known as: PROVENTIL Inhale 3  mLs (2.5 mg total) into the lungs every 6 (six) hours as needed for wheezing or shortness of breath.   albuterol 108 (90 Base) MCG/ACT inhaler Commonly known as: VENTOLIN HFA Inhale 2 puffs into the lungs every 4 (four) hours as needed for wheezing or shortness of breath.   alendronate 70 MG tablet Commonly known as: FOSAMAX Take 70 mg by mouth once a week.   Dexcom G6 Sensor Misc Inject into the skin See admin instructions. Place a new sensor under the skin every 10 days   furosemide 20 MG tablet Commonly known as: LASIX Take 1 tablet (20 mg total) by mouth 2 (two) times daily. What changed: when to take this   insulin aspart 100 UNIT/ML injection Commonly known as: novoLOG Via Insulin Pump   lactulose 10 GM/15ML solution Commonly known as: CHRONULAC Take 45 mLs (30 g total) by mouth 3 (three) times daily. Take 30 grams (45 ml's) by mouth three to five times a day until 3 bowel movements a day are achieved, then as directed   levothyroxine 50 MCG tablet Commonly known as: SYNTHROID Take 50 mcg by mouth daily before breakfast.   lipase/protease/amylase 12000-38000 units Cpep capsule Commonly known as: CREON Take 4 capsules (48,000 Units total) by mouth 3 (three) times daily before meals.   Magnesium 400 MG Tabs Take 400 mg by mouth daily.   ondansetron 4 MG disintegrating tablet Commonly known as: Zofran ODT Take 1 tablet (4 mg total) by mouth See admin instructions. Dissolve 4 mg orally one to two times a day What changed:  when to take this reasons to take this additional instructions   Oxycodone HCl 10 MG Tabs Take 10 mg by  mouth every 6 (six) hours as needed (pain).   OXYGEN Inhale 2-4 L/min into the lungs as needed (for shortness of breath).   pantoprazole 40 MG tablet Commonly known as: PROTONIX Take 1 tablet (40 mg total) by mouth daily. Start taking on: April 16, 2021   potassium chloride SA 20 MEQ tablet Commonly known as: KLOR-CON M Take 1 tablet (20 mEq total) by mouth 2 (two) times daily.   spironolactone 100 MG tablet Commonly known as: ALDACTONE Take 1 tablet (100 mg total) by mouth daily. Start taking on: April 16, 2021 What changed:  medication strength how much to take   Vitamin D (Ergocalciferol) 1.25 MG (50000 UNIT) Caps capsule Commonly known as: DRISDOL Take 50,000 Units by mouth every Wednesday.   Xifaxan 550 MG Tabs tablet Generic drug: rifaximin Take 550 mg by mouth 2 (two) times daily.        Major procedures and Radiology Reports - PLEASE review detailed and final reports for all details, in brief -  DG CHEST PORT 1 VIEW  Result Date: 04/11/2021 CLINICAL DATA:  Patient pulled  PICC out EXAM: PORTABLE CHEST 1 VIEW COMPARISON:  04/06/2021 FINDINGS: Right arm PICC tip at the cavoatrial junction unchanged from the prior study. Progression of patchy right lower lobe infiltrate. Small right pleural effusion as progressed. No change in left lower lobe infiltrate. Mild cardiac enlargement.  Negative for heart failure or edema. IMPRESSION: PICC tip in the SVC at the cavoatrial junction unchanged from prior study Progression of right lower lobe infiltrate and effusion possible pneumonia. No change left lower lobe infiltrate. Electronically Signed   By: Franchot Gallo M.D.   On: 04/11/2021 13:19   DG CHEST PORT 1 VIEW  Result Date: 04/06/2021 CLINICAL DATA:  PICC placement EXAM: PORTABLE CHEST 1 VIEW  COMPARISON:  04/05/2021 FINDINGS: Interval placement of right upper extremity PICC, tip positioned near the superior cavoatrial junction. Otherwise unchanged AP portable examination  with cardiomegaly and pulmonary vascular prominence as well as calcified bilateral pulmonary nodules. IMPRESSION: 1. Interval placement of right upper extremity PICC, tip positioned near the superior cavoatrial junction. 2. Otherwise unchanged AP portable examination with cardiomegaly and pulmonary vascular prominence. Electronically Signed   By: Delanna Ahmadi M.D.   On: 04/06/2021 13:17   DG Chest Port 1 View  Result Date: 04/05/2021 CLINICAL DATA:  Possible sepsis EXAM: PORTABLE CHEST 1 VIEW COMPARISON:  03/17/2021 FINDINGS: Cardiac shadow is enlarged but stable. Increasing vascular congestion is noted without significant edema. No focal infiltrate is seen. Mild left basilar atelectasis is noted. No bony abnormality is seen. IMPRESSION: Increasing congestive failure without definitive edema. Mild left basilar atelectasis. Electronically Signed   By: Inez Catalina M.D.   On: 04/05/2021 23:13   DG CHEST PORT 1 VIEW  Result Date: 03/17/2021 CLINICAL DATA:  Weakness and confusion.  Hypoxia. EXAM: PORTABLE CHEST 1 VIEW COMPARISON:  03/16/2021 and 01/28/2021 FINDINGS: Coarse lung markings appear to be chronic. Prominent central vascular structures are unchanged. No focal airspace disease or lung consolidation. Heart size is within normal limits and stable. Negative for a pneumothorax. No acute bone abnormality. IMPRESSION: 1. Coarse lung markings appear to be chronic. No evidence for acute airspace disease or consolidation. 2. Stable enlargement of the central vascular structures and cannot exclude chronic vascular congestion. Electronically Signed   By: Markus Daft M.D.   On: 03/17/2021 14:22   [place] ASCITES (ABDOMEN LIMITED)  Result Date: 04/13/2021 CLINICAL DATA:  Cirrhosis, ascites, ascites check for paracentesis EXAM: LIMITED ABDOMEN ULTRASOUND FOR ASCITES TECHNIQUE: Limited ultrasound survey for ascites was performed in all four abdominal quadrants. COMPARISON:  04/09/2021 FINDINGS: Only a small amount of  perihepatic ascites is identified. Minimal ascites in RIGHT upper quadrant and RIGHT gutter. Insufficient ascites for paracentesis. Nodular hepatic margins consistent with history of cirrhosis. IMPRESSION: Insufficient ascites for paracentesis. Electronically Signed   By: Lavonia Dana M.D.   On: 04/13/2021 15:21   [place] ASCITES (ABDOMEN LIMITED)  Result Date: 04/09/2021 CLINICAL DATA:  Cirrhosis, ascites, abdominal distension, for therapeutic paracentesis EXAM: LIMITED ABDOMEN ULTRASOUND FOR ASCITES TECHNIQUE: Limited ultrasound survey for ascites was performed in all four abdominal quadrants. COMPARISON:  04/06/2021 FINDINGS: Only small volume of ascites is identified. Volume of ascites visualize is insufficient for expected therapeutic benefit from paracentesis. IMPRESSION: Insufficient ascites for therapeutic paracentesis. Electronically Signed   By: Lavonia Dana M.D.   On: 04/09/2021 14:20   [place] EKG SITE RITE  Result Date: 04/06/2021 If Site Rite image not attached, placement could not be confirmed due to current cardiac rhythm.  US Abdomen Limited RUQ (LIVER/GB)  Result Date: 04/06/2021 CLINICAL DATA:  Cholangitis.  Prior cholecystectomy. EXAM: ULTRASOUND ABDOMEN LIMITED RIGHT UPPER QUADRANT COMPARISON:  07/29/2020 FINDINGS: Gallbladder: Surgically absent Common bile duct: Diameter: 1.1 cm Liver: Nodular contour compatible with cirrhosis. Perihepatic ascites. 1.5 cm peripheral cyst in the right hepatic lobe on image 23 series 1. The portal vein is patent where visualized, but with abnormal reversal of flow (hepatofugal) shown on image 38. Cavernous transformation of the portal vein noted. Other: None. IMPRESSION: 1. Hepatic cirrhosis observed with perihepatic ascites and abnormal hepatofugal flow in the portal vein which demonstrates cavernous transformation. 2. Surgically absent gallbladder. 3. Stable dilated common bile duct at 1.1 cm in diameter. Electronically Signed   By: Cindra Eves.D.  On: 04/06/2021 10:17    Micro Results   Recent Results (from the past 240 hour(s))  Resp Panel by RT-PCR (Flu A&B, Covid) Nasopharyngeal Swab     Status: None   Collection Time: 04/05/21 10:15 PM   Specimen: Nasopharyngeal Swab; Nasopharyngeal(NP) swabs in vial transport medium  Result Value Ref Range Status   SARS Coronavirus 2 by RT PCR NEGATIVE NEGATIVE Final    Comment: (NOTE) SARS-CoV-2 target nucleic acids are NOT DETECTED.  The SARS-CoV-2 RNA is generally detectable in upper respiratory specimens during the acute phase of infection. The lowest concentration of SARS-CoV-2 viral copies this assay can detect is 138 copies/mL. A negative result does not preclude SARS-Cov-2 infection and should not be used as the sole basis for treatment or other patient management decisions. A negative result may occur with  improper specimen collection/handling, submission of specimen other than nasopharyngeal swab, presence of viral mutation(s) within the areas targeted by this assay, and inadequate number of viral copies(<138 copies/mL). A negative result must be combined with clinical observations, patient history, and epidemiological information. The expected result is Negative.  Fact Sheet for Patients:  EntrepreneurPulse.com.au  Fact Sheet for Healthcare Providers:  IncredibleEmployment.be  This test is no t yet approved or cleared by the [place] FDA and  has been authorized for detection and/or diagnosis of SARS-CoV-2 by FDA under an Emergency Use Authorization (EUA). This EUA will remain  in effect (meaning this test can be used) for the duration of the COVID-19 declaration under Section 564(b)(1) of the Act, 21 U.S.C.section 360bbb-3(b)(1), unless the authorization is terminated  or revoked sooner.       Influenza A by PCR NEGATIVE NEGATIVE Final   Influenza B by PCR NEGATIVE NEGATIVE Final    Comment: (NOTE) The Xpert Xpress  SARS-CoV-2/FLU/RSV plus assay is intended as an aid in the diagnosis of influenza from Nasopharyngeal swab specimens and should not be used as a sole basis for treatment. Nasal washings and aspirates are unacceptable for Xpert Xpress SARS-CoV-2/FLU/RSV testing.  Fact Sheet for Patients: EntrepreneurPulse.com.au  Fact Sheet for Healthcare Providers: IncredibleEmployment.be  This test is not yet approved or cleared by the [place] FDA and has been authorized for detection and/or diagnosis of SARS-CoV-2 by FDA under an Emergency Use Authorization (EUA). This EUA will remain in effect (meaning this test can be used) for the duration of the COVID-19 declaration under Section 564(b)(1) of the Act, 21 U.S.C. section 360bbb-3(b)(1), unless the authorization is terminated or revoked.  Performed at [facility], [address], [place], [place] [address]   Blood Culture (routine x 2)     Status: Abnormal   Collection Time: 04/05/21 10:39 PM   Specimen: Left Antecubital; Blood  Result Value Ref Range Status   Specimen Description   Final    LEFT ANTECUBITAL Performed at [facility], [address] [place], [place] [address]    Special Requests   Final    BOTTLES DRAWN AEROBIC AND ANAEROBIC Blood Culture adequate volume Performed at [facility], [address], [place], [place] [address]    Culture  Setup Time   Final    GRAM POSITIVE COCCI IN CLUSTERS IN BOTH AEROBIC AND ANAEROBIC BOTTLES CALL PREVIOUS TO ICU FOILEY, B. Organism ID to follow CRITICAL RESULT CALLED TO, READ BACK BY AND VERIFIED WITH: JESSICA HEARN RN 04/06/21 @2039  BY JW Performed at [facility], [place] [address], [place], [place] [address]    Culture STAPHYLOCOCCUS SIMULANS (A)  Final  Report Status 04/08/2021 FINAL  Final   Organism ID, Bacteria STAPHYLOCOCCUS SIMULANS  Final      Susceptibility   Staphylococcus simulans - MIC*    CIPROFLOXACIN <=0.5 SENSITIVE Sensitive      ERYTHROMYCIN >=8 RESISTANT Resistant     GENTAMICIN <=0.5 SENSITIVE Sensitive     OXACILLIN <=0.25 SENSITIVE Sensitive     TETRACYCLINE <=1 SENSITIVE Sensitive     VANCOMYCIN <=0.5 SENSITIVE Sensitive     TRIMETH/SULFA <=10 SENSITIVE Sensitive     CLINDAMYCIN >=8 RESISTANT Resistant     RIFAMPIN 16 RESISTANT Resistant     Inducible Clindamycin NEGATIVE Sensitive     * STAPHYLOCOCCUS SIMULANS  Blood Culture (routine x 2)     Status: Abnormal   Collection Time: 04/05/21 10:39 PM   Specimen: BLOOD RIGHT ARM  Result Value Ref Range Status   Specimen Description   Final    BLOOD RIGHT ARM Performed at [facility], [address], [place], [place] [address]    Special Requests   Final    BOTTLES DRAWN AEROBIC ONLY Blood Culture adequate volume Performed at [facility], [address] [place] [address], [place], [place] [address]    Culture  Setup Time   Final    GRAM POSITIVE COCCI IN CLUSTERS AEROBIC BOTTLE ONLY  RESULTS CALLED AND READ BACK FROM FOILY, B AT 1400 ON 04/06/21 BY BROWNING, D.    Culture (A)  Final    STAPHYLOCOCCUS SIMULANS SUSCEPTIBILITIES PERFORMED ON PREVIOUS CULTURE WITHIN THE LAST 5 DAYS. Performed at [facility], [place] [address], [place], [place] [address]    Report Status 04/08/2021 FINAL  Final  Blood Culture ID Panel (Reflexed)     Status: Abnormal   Collection Time: 04/05/21 10:39 PM  Result Value Ref Range Status   Enterococcus faecalis NOT DETECTED NOT DETECTED Final   Enterococcus Faecium NOT DETECTED NOT DETECTED Final   Listeria monocytogenes NOT DETECTED NOT DETECTED Final   Staphylococcus species DETECTED (A) NOT DETECTED Final    Comment: CRITICAL RESULT CALLED TO, READ BACK BY AND VERIFIED WITH: JESSICA HEARN RN 04/06/21 @2039  BY JW    Staphylococcus aureus (BCID) NOT DETECTED NOT DETECTED Final   Staphylococcus epidermidis NOT DETECTED NOT DETECTED Final   Staphylococcus lugdunensis NOT DETECTED NOT DETECTED Final   Streptococcus species NOT  DETECTED NOT DETECTED Final   Streptococcus agalactiae NOT DETECTED NOT DETECTED Final   Streptococcus pneumoniae NOT DETECTED NOT DETECTED Final   Streptococcus pyogenes NOT DETECTED NOT DETECTED Final   A.calcoaceticus-baumannii NOT DETECTED NOT DETECTED Final   Bacteroides fragilis NOT DETECTED NOT DETECTED Final   Enterobacterales NOT DETECTED NOT DETECTED Final   Enterobacter cloacae complex NOT DETECTED NOT DETECTED Final   Escherichia coli NOT DETECTED NOT DETECTED Final   Klebsiella aerogenes NOT DETECTED NOT DETECTED Final   Klebsiella oxytoca NOT DETECTED NOT DETECTED Final   Klebsiella pneumoniae NOT DETECTED NOT DETECTED Final   Proteus species NOT DETECTED NOT DETECTED Final   Salmonella species NOT DETECTED NOT DETECTED Final   Serratia marcescens NOT DETECTED NOT DETECTED Final   Haemophilus influenzae NOT DETECTED NOT DETECTED Final   Neisseria meningitidis NOT DETECTED NOT DETECTED Final   Pseudomonas aeruginosa NOT DETECTED NOT DETECTED Final   Stenotrophomonas maltophilia NOT DETECTED NOT DETECTED Final   Candida albicans NOT DETECTED NOT DETECTED Final   Candida auris NOT DETECTED NOT DETECTED Final   Candida glabrata NOT DETECTED NOT DETECTED Final   Candida krusei NOT DETECTED NOT DETECTED Final  Candida parapsilosis NOT DETECTED NOT DETECTED Final   Candida tropicalis NOT DETECTED NOT DETECTED Final   Cryptococcus neoformans/gattii NOT DETECTED NOT DETECTED Final    Comment: Performed at [facility], [address] [place], [place] [address]  MRSA Next Gen by PCR, Nasal     Status: None   Collection Time: 04/06/21  8:45 AM   Specimen: Nasal Mucosa; Nasal Swab  Result Value Ref Range Status   MRSA by PCR Next Gen NOT DETECTED NOT DETECTED Final    Comment: (NOTE) The GeneXpert MRSA Assay (FDA approved for NASAL specimens only), is one component of a comprehensive MRSA colonization surveillance program. It is not intended to diagnose MRSA infection nor  to guide or monitor treatment for MRSA infections. Test performance is not FDA approved in patients less than 36 years old. Performed at [facility], [address] [place], [place] [address]   Culture, blood (routine x 2)     Status: None   Collection Time: 04/10/21  4:58 PM   Specimen: Left Antecubital; Blood  Result Value Ref Range Status   Specimen Description   Final    LEFT ANTECUBITAL BOTTLES DRAWN AEROBIC AND ANAEROBIC   Special Requests   Final    Blood Culture results may not be optimal due to an excessive volume of blood received in culture bottles   Culture   Final    NO GROWTH 5 DAYS Performed at [facility], [address], [place], [place] [address]    Report Status 04/15/2021 FINAL  Final  Culture, blood (routine x 2)     Status: None   Collection Time: 04/10/21  5:09 PM   Specimen: BLOOD LEFT HAND  Result Value Ref Range Status   Specimen Description   Final    BLOOD LEFT HAND BOTTLES DRAWN AEROBIC AND ANAEROBIC   Special Requests   Final    Blood Culture results may not be optimal due to an excessive volume of blood received in culture bottles   Culture   Final    NO GROWTH 5 DAYS Performed at [facility], [address] [place], [place] [address]    Report Status 04/15/2021 FINAL  Final       Today   Subjective    Mayline Dragon today has no new complaints  No fever  Or chills   No Nausea, Vomiting or Diarrhea          Patient has been seen and examined prior to discharge   Objective   Blood pressure (!) 155/66, pulse 79, temperature 98 F (36.7 C), temperature source Oral, resp. rate (!) 22, height 5' 3"  (1.6 m), weight 65.6 kg, SpO2 92 %.   Intake/Output Summary (Last 24 hours) at 04/15/2021 1847 Last data filed at 04/15/2021 1524 Gross per 24 hour  Intake 978.09 ml  Output 250 ml  Net 728.09 ml     Exam Gen:- Awake Alert, no acute distress , obese HEENT:- [place].AT, No sclera icterus Nose- [place] 2L/min Neck-Supple Neck,No JVD,.   Lungs-  CTAB , good air movement bilaterally  CV- S1, S2 normal, regular Abd-  +ve B.Sounds, Abd Soft, No tenderness, increased truncal adiposity Extremity/Skin:-Improved edema,   good pulses Psych-affect is appropriate, oriented x3 Neuro-generalized weakness, no new focal deficits, no tremors    Data Review   CBC w Diff:  Lab Results  Component Value Date   WBC 4.5 04/15/2021   HGB 7.6 (L) 04/15/2021   HCT 23.1 (L) 04/15/2021  PLT 40 (L) 04/15/2021   LYMPHOPCT 3 04/05/2021   MONOPCT 9 04/05/2021   EOSPCT 0 04/05/2021   BASOPCT 0 04/05/2021    CMP:  Lab Results  Component Value Date   NA 139 04/15/2021   K 3.5 04/15/2021   CL 103 04/15/2021   CO2 29 04/15/2021   BUN 17 04/15/2021   CREATININE <0.30 (L) 04/15/2021   PROT 5.5 (L) 04/15/2021   ALBUMIN 3.5 04/15/2021   BILITOT 17.9 (H) 04/15/2021   ALKPHOS 77 04/15/2021   AST 44 (H) 04/15/2021   ALT 17 04/15/2021  .   Total Discharge time is about 33 minutes  Roxan Hockey M.D on 04/15/2021 at 6:47 PM  Go to [email or web address] -  for contact info  [facility] - Office  [phone]

## 2021-04-15 NOTE — Discharge Instructions (Signed)
1)Avoid ibuprofen/Advil/Aleve/Motrin/Goody Powders/Naproxen/BC powders/Meloxicam/Diclofenac/Indomethacin and other Nonsteroidal anti-inflammatory medications as these will make you more likely to bleed and can cause stomach ulcers, can also cause Kidney problems.   2)Repeat CBC and CMP in 1 week--- at [facility] (with PCP)  3)You need oxygen at home at 2 L via nasal cannula continuously while awake and while asleep--- , please use up to 4L/min of oxygen with activity----- having open fires around oxygen can cause fire, significant injury and death  4)Follow up with gastroenterologist at [facility]  5)Very low-salt diet advised  6)Weigh yourself daily, call if you gain more than 3 pounds in 1 day or more than 5 pounds in 1 week as your diuretic medications may need to be adjusted

## 2021-04-15 NOTE — Progress Notes (Signed)
Received at report patients PICC line was bleeding during the night, noted bleeding to PICC line this morning, pressure dressing already in place. MD Courage made aware.

## 2021-04-15 NOTE — Progress Notes (Signed)
Patient completed blood transfusion, BP 155/66, R-22 patient had moved from the chair to bed. MD Courage made aware. No new orders.

## 2021-04-15 NOTE — Progress Notes (Signed)
Patients tele monitor reading increased respirations. Patient stated she felt SOB, oxygen saturation 86% at 2 liter increased to 3 liters oxygen saturation 89-90%. Patient stated she is on 4 liters at home. MD Courage made aware. Patient placed on 4 liters per MD.

## 2021-04-16 LAB — BPAM RBC
Blood Product Expiration Date: 202301282359
ISSUE DATE / TIME: 202301261226
Unit Type and Rh: 600

## 2021-04-16 LAB — TYPE AND SCREEN
ABO/RH(D): A POS
Antibody Screen: NEGATIVE
Unit division: 0

## 2021-04-16 LAB — PATHOLOGIST SMEAR REVIEW

## 2021-04-16 LAB — HAPTOGLOBIN: Haptoglobin: <10 mg/dL — ABNORMAL LOW (ref 33–346)

## 2021-04-20 ENCOUNTER — Inpatient Hospital Stay (HOSPITAL_COMMUNITY): Payer: Medicare Other

## 2021-04-20 ENCOUNTER — Inpatient Hospital Stay (HOSPITAL_COMMUNITY)
Admission: AD | Admit: 2021-04-20 | Discharge: 2021-04-26 | DRG: 291 | Disposition: A | Discharge status: Home Health | Payer: Medicare Other | Source: Other Acute Inpatient Hospital | Attending: Internal Medicine | Admitting: Internal Medicine

## 2021-04-20 DIAGNOSIS — E876 Hypokalemia: Secondary | ICD-10-CM | POA: Diagnosis not present

## 2021-04-20 DIAGNOSIS — Z9049 Acquired absence of other specified parts of digestive tract: Secondary | ICD-10-CM

## 2021-04-20 DIAGNOSIS — D61818 Other pancytopenia: Secondary | ICD-10-CM | POA: Diagnosis present

## 2021-04-20 DIAGNOSIS — E890 Postprocedural hypothyroidism: Secondary | ICD-10-CM | POA: Diagnosis present

## 2021-04-20 DIAGNOSIS — K7210 Chronic hepatic failure without coma: Secondary | ICD-10-CM | POA: Diagnosis present

## 2021-04-20 DIAGNOSIS — Z01810 Encounter for preprocedural cardiovascular examination: Secondary | ICD-10-CM

## 2021-04-20 DIAGNOSIS — Z882 Allergy status to sulfonamides status: Secondary | ICD-10-CM

## 2021-04-20 DIAGNOSIS — K7682 Hepatic encephalopathy: Secondary | ICD-10-CM | POA: Diagnosis present

## 2021-04-20 DIAGNOSIS — E039 Hypothyroidism, unspecified: Secondary | ICD-10-CM | POA: Diagnosis present

## 2021-04-20 DIAGNOSIS — E119 Type 2 diabetes mellitus without complications: Secondary | ICD-10-CM

## 2021-04-20 DIAGNOSIS — I5033 Acute on chronic diastolic (congestive) heart failure: Secondary | ICD-10-CM | POA: Diagnosis present

## 2021-04-20 DIAGNOSIS — Z881 Allergy status to other antibiotic agents status: Secondary | ICD-10-CM

## 2021-04-20 DIAGNOSIS — K8689 Other specified diseases of pancreas: Secondary | ICD-10-CM | POA: Diagnosis present

## 2021-04-20 DIAGNOSIS — D638 Anemia in other chronic diseases classified elsewhere: Secondary | ICD-10-CM | POA: Diagnosis present

## 2021-04-20 DIAGNOSIS — Z9981 Dependence on supplemental oxygen: Secondary | ICD-10-CM

## 2021-04-20 DIAGNOSIS — Z7983 Long term (current) use of bisphosphonates: Secondary | ICD-10-CM | POA: Diagnosis not present

## 2021-04-20 DIAGNOSIS — J9621 Acute and chronic respiratory failure with hypoxia: Secondary | ICD-10-CM | POA: Diagnosis present

## 2021-04-20 DIAGNOSIS — Z79890 Hormone replacement therapy (postmenopausal): Secondary | ICD-10-CM

## 2021-04-20 DIAGNOSIS — Z66 Do not resuscitate: Secondary | ICD-10-CM | POA: Diagnosis present

## 2021-04-20 DIAGNOSIS — K7030 Alcoholic cirrhosis of liver without ascites: Secondary | ICD-10-CM | POA: Diagnosis present

## 2021-04-20 DIAGNOSIS — Z9641 Presence of insulin pump (external) (internal): Secondary | ICD-10-CM | POA: Diagnosis present

## 2021-04-20 DIAGNOSIS — J9622 Acute and chronic respiratory failure with hypercapnia: Secondary | ICD-10-CM | POA: Diagnosis present

## 2021-04-20 DIAGNOSIS — R1310 Dysphagia, unspecified: Secondary | ICD-10-CM | POA: Diagnosis present

## 2021-04-20 DIAGNOSIS — G8929 Other chronic pain: Secondary | ICD-10-CM | POA: Diagnosis present

## 2021-04-20 DIAGNOSIS — T502X5A Adverse effect of carbonic-anhydrase inhibitors, benzothiadiazides and other diuretics, initial encounter: Secondary | ICD-10-CM | POA: Diagnosis present

## 2021-04-20 DIAGNOSIS — D696 Thrombocytopenia, unspecified: Secondary | ICD-10-CM | POA: Diagnosis not present

## 2021-04-20 DIAGNOSIS — Z85038 Personal history of other malignant neoplasm of large intestine: Secondary | ICD-10-CM

## 2021-04-20 DIAGNOSIS — I272 Pulmonary hypertension, unspecified: Secondary | ICD-10-CM | POA: Diagnosis present

## 2021-04-20 DIAGNOSIS — Z79899 Other long term (current) drug therapy: Secondary | ICD-10-CM

## 2021-04-20 DIAGNOSIS — Z7984 Long term (current) use of oral hypoglycemic drugs: Secondary | ICD-10-CM

## 2021-04-20 DIAGNOSIS — D689 Coagulation defect, unspecified: Secondary | ICD-10-CM | POA: Diagnosis present

## 2021-04-20 DIAGNOSIS — Z87440 Personal history of urinary (tract) infections: Secondary | ICD-10-CM

## 2021-04-20 DIAGNOSIS — K766 Portal hypertension: Secondary | ICD-10-CM | POA: Diagnosis present

## 2021-04-20 DIAGNOSIS — R109 Unspecified abdominal pain: Secondary | ICD-10-CM

## 2021-04-20 DIAGNOSIS — Z01818 Encounter for other preprocedural examination: Secondary | ICD-10-CM

## 2021-04-20 DIAGNOSIS — G4730 Sleep apnea, unspecified: Secondary | ICD-10-CM | POA: Diagnosis present

## 2021-04-20 DIAGNOSIS — E1143 Type 2 diabetes mellitus with diabetic autonomic (poly)neuropathy: Secondary | ICD-10-CM | POA: Diagnosis present

## 2021-04-20 DIAGNOSIS — F410 Panic disorder [episodic paroxysmal anxiety] without agoraphobia: Secondary | ICD-10-CM | POA: Diagnosis present

## 2021-04-20 DIAGNOSIS — D539 Nutritional anemia, unspecified: Secondary | ICD-10-CM | POA: Diagnosis present

## 2021-04-20 DIAGNOSIS — Z888 Allergy status to other drugs, medicaments and biological substances status: Secondary | ICD-10-CM

## 2021-04-20 DIAGNOSIS — D6859 Other primary thrombophilia: Secondary | ICD-10-CM | POA: Diagnosis present

## 2021-04-20 DIAGNOSIS — R0902 Hypoxemia: Secondary | ICD-10-CM | POA: Diagnosis present

## 2021-04-20 DIAGNOSIS — I248 Other forms of acute ischemic heart disease: Secondary | ICD-10-CM | POA: Diagnosis present

## 2021-04-20 DIAGNOSIS — Z993 Dependence on wheelchair: Secondary | ICD-10-CM

## 2021-04-20 DIAGNOSIS — I110 Hypertensive heart disease with heart failure: Principal | ICD-10-CM | POA: Diagnosis present

## 2021-04-20 DIAGNOSIS — K219 Gastro-esophageal reflux disease without esophagitis: Secondary | ICD-10-CM | POA: Diagnosis present

## 2021-04-20 DIAGNOSIS — K861 Other chronic pancreatitis: Secondary | ICD-10-CM | POA: Diagnosis present

## 2021-04-20 DIAGNOSIS — Z794 Long term (current) use of insulin: Secondary | ICD-10-CM

## 2021-04-20 DIAGNOSIS — Z886 Allergy status to analgesic agent status: Secondary | ICD-10-CM

## 2021-04-20 DIAGNOSIS — Z20822 Contact with and (suspected) exposure to covid-19: Secondary | ICD-10-CM | POA: Diagnosis present

## 2021-04-20 DIAGNOSIS — E7220 Disorder of urea cycle metabolism, unspecified: Secondary | ICD-10-CM | POA: Diagnosis present

## 2021-04-20 DIAGNOSIS — R161 Splenomegaly, not elsewhere classified: Secondary | ICD-10-CM | POA: Diagnosis present

## 2021-04-20 DIAGNOSIS — Z91119 Patient's noncompliance with dietary regimen due to unspecified reason: Secondary | ICD-10-CM

## 2021-04-20 DIAGNOSIS — K3184 Gastroparesis: Secondary | ICD-10-CM | POA: Diagnosis present

## 2021-04-20 DIAGNOSIS — K7581 Nonalcoholic steatohepatitis (NASH): Secondary | ICD-10-CM | POA: Diagnosis present

## 2021-04-20 DIAGNOSIS — Z781 Physical restraint status: Secondary | ICD-10-CM

## 2021-04-20 LAB — COMPREHENSIVE METABOLIC PANEL
ALT: 17 U/L (ref 0–44)
AST: 36 U/L (ref 15–41)
Albumin: 2.9 g/dL — ABNORMAL LOW (ref 3.5–5.0)
Alkaline Phosphatase: 63 U/L (ref 38–126)
Anion gap: 8 (ref 5–15)
BUN: 39 mg/dL — ABNORMAL HIGH (ref 6–20)
CO2: 28 mmol/L (ref 22–32)
Calcium: 9.2 mg/dL (ref 8.9–10.3)
Chloride: 100 mmol/L (ref 98–111)
Creatinine, Ser: 1.21 mg/dL — ABNORMAL HIGH (ref 0.44–1.00)
GFR, Estimated: 52 mL/min — ABNORMAL LOW (ref >60)
Glucose, Bld: 219 mg/dL — ABNORMAL HIGH (ref 70–99)
Potassium: 5 mmol/L (ref 3.5–5.1)
Sodium: 136 mmol/L (ref 135–145)
Total Bilirubin: 20 mg/dL (ref 0.3–1.2)
Total Protein: 5.2 g/dL — ABNORMAL LOW (ref 6.5–8.1)

## 2021-04-20 LAB — BLOOD GAS, VENOUS
Acid-Base Excess: 4.3 mmol/L — ABNORMAL HIGH (ref 0.0–2.0)
Bicarbonate: 28.5 mmol/L — ABNORMAL HIGH (ref 20.0–28.0)
Drawn by: 5915
FIO2: 36
O2 Saturation: 78.6 %
Patient temperature: 37
pCO2, Ven: 43.6 mmHg — ABNORMAL LOW (ref 44.0–60.0)
pH, Ven: 7.431 — ABNORMAL HIGH (ref 7.250–7.430)
pO2, Ven: 46.5 mmHg — ABNORMAL HIGH (ref 32.0–45.0)

## 2021-04-20 LAB — CBC
HCT: 25.8 % — ABNORMAL LOW (ref 36.0–46.0)
Hemoglobin: 8.3 g/dL — ABNORMAL LOW (ref 12.0–15.0)
MCH: 32.7 pg (ref 26.0–34.0)
MCHC: 32.2 g/dL (ref 30.0–36.0)
MCV: 101.6 fL — ABNORMAL HIGH (ref 80.0–100.0)
Platelets: 51 10*3/uL — ABNORMAL LOW (ref 150–400)
RBC: 2.54 MIL/uL — ABNORMAL LOW (ref 3.87–5.11)
RDW: 26.1 % — ABNORMAL HIGH (ref 11.5–15.5)
WBC: 6.7 10*3/uL (ref 4.0–10.5)
nRBC: 0.5 % — ABNORMAL HIGH (ref 0.0–0.2)

## 2021-04-20 LAB — PROTIME-INR
INR: 3.2 — ABNORMAL HIGH (ref 0.8–1.2)
Prothrombin Time: 32.8 seconds — ABNORMAL HIGH (ref 11.4–15.2)

## 2021-04-20 LAB — SEDIMENTATION RATE: Sed Rate: 6 mm/hr (ref 0–22)

## 2021-04-20 LAB — GLUCOSE, CAPILLARY: Glucose-Capillary: 231 mg/dL — ABNORMAL HIGH (ref 70–99)

## 2021-04-20 LAB — TROPONIN I (HIGH SENSITIVITY)
Troponin I (High Sensitivity): 202 ng/L (ref <18)
Troponin I (High Sensitivity): 172 ng/L (ref <18)

## 2021-04-20 LAB — LACTATE DEHYDROGENASE: LDH: 287 U/L — ABNORMAL HIGH (ref 98–192)

## 2021-04-20 LAB — C-REACTIVE PROTEIN: CRP: 1.1 mg/dL — ABNORMAL HIGH (ref <1.0)

## 2021-04-20 MED ORDER — ALBUTEROL SULFATE (2.5 MG/3ML) 0.083% IN NEBU: 3.0000 mL | INHALATION_SOLUTION | RESPIRATORY_TRACT | Status: DC | PRN

## 2021-04-20 MED ORDER — INSULIN ASPART 100 UNIT/ML IJ SOLN
0.0000 [IU] | Freq: Three times a day (TID) | INTRAMUSCULAR | Status: DC
  Administered 2021-04-21: 15 [IU] via SUBCUTANEOUS
  Administered 2021-04-21: 5 [IU] via SUBCUTANEOUS
  Administered 2021-04-21 – 2021-04-22 (×2): 8 [IU] via SUBCUTANEOUS
  Administered 2021-04-22: 3 [IU] via SUBCUTANEOUS
  Administered 2021-04-22: 2 [IU] via SUBCUTANEOUS
  Administered 2021-04-23 (×2): 11 [IU] via SUBCUTANEOUS
  Administered 2021-04-23: 3 [IU] via SUBCUTANEOUS
  Administered 2021-04-24: 5 [IU] via SUBCUTANEOUS
  Administered 2021-04-24: 11 [IU] via SUBCUTANEOUS

## 2021-04-20 MED ORDER — FUROSEMIDE 10 MG/ML IJ SOLN
20.0000 mg | Freq: Two times a day (BID) | INTRAMUSCULAR | Status: DC
  Administered 2021-04-20 – 2021-04-23 (×7): 20 mg via INTRAVENOUS
  Filled 2021-04-20 (×8): qty 2

## 2021-04-20 MED ORDER — PANTOPRAZOLE SODIUM 40 MG PO TBEC
40.0000 mg | DELAYED_RELEASE_TABLET | Freq: Every day | ORAL | Status: DC
  Administered 2021-04-21 – 2021-04-26 (×6): 40 mg via ORAL
  Filled 2021-04-20 (×6): qty 1

## 2021-04-20 MED ORDER — ALBUTEROL SULFATE (2.5 MG/3ML) 0.083% IN NEBU: 2.5000 mg | INHALATION_SOLUTION | Freq: Four times a day (QID) | RESPIRATORY_TRACT | Status: DC | PRN

## 2021-04-20 MED ORDER — SPIRONOLACTONE 100 MG PO TABS
100.0000 mg | ORAL_TABLET | Freq: Every day | ORAL | Status: DC
  Administered 2021-04-21 – 2021-04-26 (×6): 100 mg via ORAL
  Filled 2021-04-20 (×6): qty 1

## 2021-04-20 MED ORDER — OXYCODONE HCL 5 MG PO TABS
10.0000 mg | ORAL_TABLET | Freq: Four times a day (QID) | ORAL | Status: DC | PRN
  Administered 2021-04-21 – 2021-04-25 (×11): 10 mg via ORAL
  Filled 2021-04-20 (×12): qty 2

## 2021-04-20 MED ORDER — POTASSIUM CHLORIDE CRYS ER 20 MEQ PO TBCR: 20.0000 meq | EXTENDED_RELEASE_TABLET | Freq: Two times a day (BID) | ORAL | Status: DC

## 2021-04-20 MED ORDER — RIFAXIMIN 550 MG PO TABS
550.0000 mg | ORAL_TABLET | Freq: Two times a day (BID) | ORAL | Status: DC
Start: 2021-04-20 — End: 2021-04-26
  Administered 2021-04-20 – 2021-04-26 (×11): 550 mg via ORAL
  Filled 2021-04-20 (×14): qty 1

## 2021-04-20 MED ORDER — LACTULOSE 10 GM/15ML PO SOLN
30.0000 g | Freq: Three times a day (TID) | ORAL | Status: DC
  Administered 2021-04-20 – 2021-04-22 (×5): 30 g via ORAL
  Filled 2021-04-20 (×7): qty 45

## 2021-04-20 MED ORDER — VITAMIN D (ERGOCALCIFEROL) 1.25 MG (50000 UNIT) PO CAPS
50000.0000 [IU] | ORAL_CAPSULE | ORAL | Status: DC
  Administered 2021-04-21: 50000 [IU] via ORAL
  Filled 2021-04-20: qty 1

## 2021-04-20 MED ORDER — ALENDRONATE SODIUM 70 MG PO TABS: 70.0000 mg | ORAL_TABLET | ORAL | Status: DC

## 2021-04-20 MED ORDER — PANCRELIPASE (LIP-PROT-AMYL) 36000-114000 UNITS PO CPEP
48000.0000 [IU] | ORAL_CAPSULE | Freq: Three times a day (TID) | ORAL | Status: DC
  Administered 2021-04-21 – 2021-04-26 (×15): 48000 [IU] via ORAL
  Filled 2021-04-20 (×16): qty 1

## 2021-04-20 MED ORDER — PHYTONADIONE 5 MG PO TABS
5.0000 mg | ORAL_TABLET | Freq: Once | ORAL | Status: AC
  Administered 2021-04-20: 5 mg via ORAL
  Filled 2021-04-20: qty 1

## 2021-04-20 MED ORDER — MAGNESIUM OXIDE -MG SUPPLEMENT 400 (240 MG) MG PO TABS
400.0000 mg | ORAL_TABLET | Freq: Every day | ORAL | Status: DC
  Administered 2021-04-21 – 2021-04-25 (×5): 400 mg via ORAL
  Filled 2021-04-20 (×6): qty 1

## 2021-04-20 MED ORDER — LEVOTHYROXINE SODIUM 50 MCG PO TABS
50.0000 ug | ORAL_TABLET | Freq: Every day | ORAL | Status: DC
  Administered 2021-04-23 – 2021-04-26 (×4): 50 ug via ORAL
  Filled 2021-04-20 (×5): qty 1

## 2021-04-20 NOTE — Progress Notes (Signed)

## 2021-04-20 NOTE — Progress Notes (Signed)
BiPAP removed from pt in order for pt to eat. No distress or increased WOB noted. Pt placed on 5L [place] and comfortable.

## 2021-04-20 NOTE — Progress Notes (Signed)
MD Mikey College was informed of pts. Arrival and pt is on BIPAP.

## 2021-04-20 NOTE — Progress Notes (Signed)
DNR and FALL RISK bracelet were place on pt by this nurse

## 2021-04-20 NOTE — H&P (Signed)
History and Physical    [name] [id number] DOB: 1965/07/09 DOA: 04/20/2021  PCP: [facility] [place] (Confirm with patient/family/NH records and if not entered, this has to be entered at [facility] point of entry) Patient coming from: Home  I have personally briefly reviewed patient's old medical records in [place]  Chief Complaint: SOB  HPI: [name] is a 56 y.o. female with medical history significant of NASH cirrhosis, hypertension, chronic normocytic anemia secondary to chronic illness, IDDM, chronic pancreatic insufficiency, chronic abdominal pain, chronic thrombocytopenia, chronic hypoxic respiratory failure baseline 2 L, presented with cough and increasing shortness of breath.  Patient was recently hospitalized for ESBL UTI for which patient completed 7 days of meropenem treatment, same time patient also found to have worsening of anemia and received a total of 5 unit PRBC and last hemoglobin level 7.0 on discharge 5 days ago at [facility], she complains about continue to experience shortness of breath, cough, and feeling nausea but no vomiting.  She denied any black tarry stool, no fever chills no chest pains.  ED Course: [facility] ED, ABG showed 7.2 4/70/95, and patient was placed on BiPAP and CO2 retention acidosis resolved.  Hemoglobin 9.3, platelet 56 creatinine 0.7.  Bilirubin 24.  And x-ray showed patchy groundglass airspace disease small bilateral pleural effusion concerning for pulmonary edema.  Patient was given 40 mg IV Lasix breathing treatment hydralazine 10 mg x 1 Solu-Medrol 80 mg IV and meropenem 1 g IV.  Appears that the ED contacted [facility] for transfer however patient refused to go back to [facility] and decided to tome to [facility]  Review of Systems: As per HPI otherwise 14 point review of systems negative.    Past Medical History:  Diagnosis Date   Degenerative joint disease    Diabetes mellitus without  complication ([place])    Gastroesophageal reflux    Pancreatitis    Panic attack    Sleep apnea    Thrombocytopenia ([place])     Past Surgical History:  Procedure Laterality Date   CHOLECYSTECTOMY     SHOULDER SURGERY     left    tongue sx. titanium screw placed to hold tongue down     TOTAL THYROIDECTOMY       reports that she has never smoked. She has never used smokeless tobacco. She reports that she does not drink alcohol and does not use drugs.  Allergies  Allergen Reactions   Acetaminophen Hives and Other (See Comments)    Tolerates Fioricet   Cephalexin Itching, Swelling and Other (See Comments)    Tongue swells and makes throat itchy- can tolerate Zosyn   Ciprofloxacin Hives   Hydromorphone Hives   Sulfamethoxazole-Trimethoprim Shortness Of Breath, Swelling and Other (See Comments)    Tongue swells    Tizanidine Anaphylaxis   Abilify [Aripiprazole]     "I did noyt like the way this made me feel."    No family history on file.   Prior to Admission medications   Medication Sig Start Date End Date Taking? Authorizing Provider  albuterol (PROVENTIL) (2.5 MG/3ML) 0.083% nebulizer solution Inhale 3 mLs (2.5 mg total) into the lungs every 6 (six) hours as needed for wheezing or shortness of breath. 04/15/21   Denton Brick, Courage, MD  albuterol (VENTOLIN HFA) 108 (90 Base) MCG/ACT inhaler Inhale 2 puffs into the lungs every 4 (four) hours as needed for wheezing or shortness of breath. 04/15/21   Roxan Hockey, MD  alendronate (  FOSAMAX) 70 MG tablet Take 70 mg by mouth once a week. 03/11/21   [provider]  Continuous Blood Gluc Sensor (DEXCOM G6 SENSOR) MISC Inject into the skin See admin instructions. Place a new sensor under the skin every 10 days    [provider]  furosemide (LASIX) 20 MG tablet Take 1 tablet (20 mg total) by mouth 2 (two) times daily. 04/15/21   Roxan Hockey, MD  insulin aspart (NOVOLOG) 100 UNIT/ML injection Via Insulin Pump 07/31/20    Johnson, Clanford L, MD  lactulose (CHRONULAC) 10 GM/15ML solution Take 45 mLs (30 g total) by mouth 3 (three) times daily. Take 30 grams (45 ml's) by mouth three to five times a day until 3 bowel movements a day are achieved, then as directed 07/31/20   Murlean Iba, MD  levothyroxine (SYNTHROID, LEVOTHROID) 50 MCG tablet Take 50 mcg by mouth daily before breakfast.  07/25/14   [provider]  lipase/protease/amylase (CREON) 12000-38000 units CPEP capsule Take 4 capsules (48,000 Units total) by mouth 3 (three) times daily before meals. 04/15/21   Roxan Hockey, MD  Magnesium 400 MG TABS Take 400 mg by mouth daily. 04/15/21   Roxan Hockey, MD  ondansetron (ZOFRAN ODT) 4 MG disintegrating tablet Take 1 tablet (4 mg total) by mouth See admin instructions. Dissolve 4 mg orally one to two times a day Patient taking differently: Take 4 mg by mouth every 8 (eight) hours as needed. 07/31/20   Johnson, Clanford L, MD  Oxycodone HCl 10 MG TABS Take 10 mg by mouth every 6 (six) hours as needed (pain).    [provider]  OXYGEN Inhale 2-4 L/min into the lungs as needed (for shortness of breath).    [provider]  pantoprazole (PROTONIX) 40 MG tablet Take 1 tablet (40 mg total) by mouth daily. 04/16/21   Roxan Hockey, MD  potassium chloride SA (KLOR-CON M) 20 MEQ tablet Take 1 tablet (20 mEq total) by mouth 2 (two) times daily. 04/15/21   Roxan Hockey, MD  spironolactone (ALDACTONE) 100 MG tablet Take 1 tablet (100 mg total) by mouth daily. 04/16/21   Roxan Hockey, MD  Vitamin D, Ergocalciferol, (DRISDOL) 1.25 MG (50000 UNIT) CAPS capsule Take 50,000 Units by mouth every Wednesday. 06/29/20   [provider]  XIFAXAN 550 MG TABS tablet Take 550 mg by mouth 2 (two) times daily. 05/28/20   [provider]  lisinopril (ZESTRIL) 10 MG tablet Take 10 mg by mouth daily. Patient not taking: Reported on 10/02/2019 03/29/19 10/02/19  [provider]   metFORMIN (GLUCOPHAGE) 1000 MG tablet Take 1,000 mg by mouth 2 (two) times daily with a meal.  Patient not taking: Reported on 10/02/2019  10/02/19  [provider]  metoCLOPramide (REGLAN) 10 MG tablet Take 10 mg by mouth 4 (four) times daily.  Patient not taking: Reported on 10/02/2019  10/02/19  [provider]  sertraline (ZOLOFT) 100 MG tablet Take 150 mg by mouth daily. Patient not taking: Reported on 10/02/2019 09/10/16 10/02/19  [provider]  traZODone (DESYREL) 100 MG tablet Take 1 tablet by mouth daily. Patient not taking: Reported on 10/02/2019 09/15/16 10/02/19  [provider]    Physical Exam: Vitals:   04/20/21 1652 04/20/21 1705  BP:  (!) 121/58  Pulse: 77 78  Resp: 19 18  Temp:  (!) 97.5 F (36.4 C)  TempSrc:  Axillary  SpO2: 95% 95%    Constitutional: NAD, calm, comfortable Vitals:   04/20/21 1652  04/20/21 1705  BP:  (!) 121/58  Pulse: 77 78  Resp: 19 18  Temp:  (!) 97.5 F (36.4 C)  TempSrc:  Axillary  SpO2: 95% 95%   Eyes: PERRL, conjunctivae jaundiced ENMT: Mucous membranes are moist. Posterior pharynx clear of any exudate or lesions.Normal dentition.  Neck: normal, supple, no masses, no thyromegaly Respiratory: clear to auscultation bilaterally, no wheezing, fine crackles on bilateral bases.  Increasing respiratory effort. No accessory muscle use.  Cardiovascular: Regular rate and rhythm, no murmurs / rubs / gallops.  2+ extremity edema. 2+ pedal pulses. No carotid bruits.  Abdomen: no tenderness, no masses palpated. No hepatosplenomegaly. Bowel sounds positive.  Musculoskeletal: no clubbing / cyanosis. No joint deformity upper and lower extremities. Good ROM, no contractures. Normal muscle tone.  Skin: no rashes, lesions, ulcers. No induration.  Positive for clubbing Neurologic: CN 2-12 grossly intact. Sensation intact, DTR normal. Strength 5/5 in all 4.  Psychiatric: Normal judgment and insight. Alert and oriented x 3.  Normal mood.     Labs on Admission: I have personally reviewed following labs and imaging studies  CBC: Recent Labs  Lab 04/14/21 0537 04/15/21 0543  WBC 3.5* 4.5  HGB 7.0* 7.6*  HCT 21.2* 23.1*  MCV 101.0* 99.1  PLT 37* 40*   Basic Metabolic Panel: Recent Labs  Lab 04/14/21 0537 04/15/21 0543  NA 137 139  K 3.7 3.5  CL 103 103  CO2 28 29  GLUCOSE 185* 176*  BUN 15 17  CREATININE <0.30* <0.30*  CALCIUM 8.5* 9.2   GFR: CrCl cannot be calculated (This lab value cannot be used to calculate CrCl because it is not a number: <0.30). Liver Function Tests: Recent Labs  Lab 04/14/21 0537 04/15/21 0543  AST 47* 44*  ALT 18 17  ALKPHOS 92 77  BILITOT 14.5* 17.9*  PROT 5.1* 5.5*  ALBUMIN 3.1* 3.5   No results for input(s): LIPASE, AMYLASE in the last 168 hours. Recent Labs  Lab 04/14/21 0537  AMMONIA 56*   Coagulation Profile: Recent Labs  Lab 04/14/21 0537  INR 2.3*   Cardiac Enzymes: No results for input(s): CKTOTAL, CKMB, CKMBINDEX, TROPONINI in the last 168 hours. BNP (last 3 results) No results for input(s): PROBNP in the last 8760 hours. HbA1C: No results for input(s): HGBA1C in the last 72 hours. CBG: Recent Labs  Lab 04/14/21 1128 04/14/21 1643 04/14/21 2141 04/15/21 0721 04/15/21 1114  GLUCAP 197* 215* 205* 184* 305*   Lipid Profile: No results for input(s): CHOL, HDL, LDLCALC, TRIG, CHOLHDL, LDLDIRECT in the last 72 hours. Thyroid Function Tests: No results for input(s): TSH, T4TOTAL, FREET4, T3FREE, THYROIDAB in the last 72 hours. Anemia Panel: No results for input(s): VITAMINB12, FOLATE, FERRITIN, TIBC, IRON, RETICCTPCT in the last 72 hours. Urine analysis:    Component Value Date/Time   COLORURINE STRAW (A) 04/05/2021 0036   APPEARANCEUR CLEAR 04/05/2021 0036   LABSPEC 1.025 04/05/2021 0036   PHURINE 6.5 04/05/2021 0036   GLUCOSEU >=500 (A) 04/05/2021 0036   HGBUR LARGE (A) 04/05/2021 0036   BILIRUBINUR LARGE (A) 04/05/2021 0036    KETONESUR 15 (A) 04/05/2021 0036   PROTEINUR 100 (A) 04/05/2021 0036   UROBILINOGEN 0.2 04/13/2007 1545   NITRITE POSITIVE (A) 04/05/2021 0036   LEUKOCYTESUR MODERATE (A) 04/05/2021 0036    Radiological Exams on Admission: No results found.  EKG: Pending  Assessment/Plan Principal Problem:   Hypoxia Active Problems:   Acute on chronic respiratory failure with hypoxia (HCC)  (please populate well all  problems here in Problem List. (For example, if patient is on BP meds at home and you resume or decide to hold them, it is a problem that needs to be her. Same for CAD, COPD, HLD and so on)  Acute on chronic hypoxic and hypercapnic respiratory failure -Check VBG, if no more CO2 retention will start weaning off BiPAP -Etiology likely secondary to acute on chronic diastolic CHF from anemia vs high-output CHF as documented on most recent echo May 2022. -We will change Lasix to IV Lasix 20 mg twice daily -Recheck chest x-ray tomorrow. -Other DDx, will discuss with GI regarding diagnosis of hepatopulmonary syndrome.  Chronic anemia -H&H appears to be stable compared to discharge last week -Discussed with on-call hematologist Dr. Julien Nordmann, reviewed iron study and haptoglobin, hematology's impression this patient probably has a chronic bleeding issue.  Normalized iron likely from repeated blood transfusion and low haptoglobin likely from decreased hepatic synthetic function we will check LDH and reconsult hematology if hemolytic anemia suspected. -According to [facility] record, last endoscopy and colonoscopy more than 5 years ago.  H&H stable this time, no inpatient GI work-up indicated, likely can follow-up with her on GI at [facility].  Recent ESBL UTI -She has no urinary symptoms, no fever, no significant leukocytosis, and she just completed 7 days course of meropenem last week. -Suspect UA finding is colonization, will hold off antibiotics.  Chronic thrombocytopenia -Secondary to cirrhosis  and splenomegaly, no chemical DVT prophylaxis  NASH cirrhosis -With worsening of bilirubinemia, will check AFP -Consult [facility] -According to [facility] chest report, patient not a candidate for liver transplant.  And patient is DNR.  Chronic coagulopathy -Check INR, 1 dose of milligram vitamin K given.  Elevated ammonium -Mentation at baseline, continue lactulose.  IDDM -Start sliding scale.   DVT prophylaxis: SCD Code Status: DNR Family Communication: None at bedside Disposition Plan: Patient is sick with decompensated CHF and worsening of cirrhosis, expect more than 2 midnight hospital stay Consults called: Hematology curbside, reconsult if indicated, [facility]. Admission status: PCU   Lequita Halt MD [facility] Pager [phone]  04/20/2021, 6:15 PM

## 2021-04-21 ENCOUNTER — Inpatient Hospital Stay (HOSPITAL_COMMUNITY): Payer: Medicare Other

## 2021-04-21 DIAGNOSIS — R0902 Hypoxemia: Secondary | ICD-10-CM

## 2021-04-21 DIAGNOSIS — I5033 Acute on chronic diastolic (congestive) heart failure: Secondary | ICD-10-CM

## 2021-04-21 DIAGNOSIS — J9621 Acute and chronic respiratory failure with hypoxia: Secondary | ICD-10-CM

## 2021-04-21 DIAGNOSIS — D689 Coagulation defect, unspecified: Secondary | ICD-10-CM | POA: Diagnosis not present

## 2021-04-21 DIAGNOSIS — D696 Thrombocytopenia, unspecified: Secondary | ICD-10-CM | POA: Diagnosis not present

## 2021-04-21 DIAGNOSIS — K7210 Chronic hepatic failure without coma: Secondary | ICD-10-CM | POA: Diagnosis not present

## 2021-04-21 LAB — ECHOCARDIOGRAM COMPLETE
AR max vel: 1.81 cm2
AV Area VTI: 1.8 cm2
AV Area mean vel: 1.78 cm2
AV Mean grad: 15.7 mmHg
AV Peak grad: 27.5 mmHg
Ao pk vel: 2.62 m/s
Area-P 1/2: 3.06 cm2
Calc EF: 67.9 %
MV M vel: 5.43 m/s
MV Peak grad: 117.9 mmHg
Radius: 0.4 cm
S' Lateral: 2.8 cm
Single Plane A2C EF: 73.1 %
Single Plane A4C EF: 59.2 %

## 2021-04-21 LAB — COMPREHENSIVE METABOLIC PANEL
ALT: 16 U/L (ref 0–44)
AST: 34 U/L (ref 15–41)
Albumin: 2.7 g/dL — ABNORMAL LOW (ref 3.5–5.0)
Alkaline Phosphatase: 60 U/L (ref 38–126)
Anion gap: 10 (ref 5–15)
BUN: 39 mg/dL — ABNORMAL HIGH (ref 6–20)
CO2: 27 mmol/L (ref 22–32)
Calcium: 9.2 mg/dL (ref 8.9–10.3)
Chloride: 100 mmol/L (ref 98–111)
Creatinine, Ser: 0.9 mg/dL (ref 0.44–1.00)
GFR, Estimated: >60 mL/min (ref >60)
Glucose, Bld: 262 mg/dL — ABNORMAL HIGH (ref 70–99)
Potassium: 4.4 mmol/L (ref 3.5–5.1)
Sodium: 137 mmol/L (ref 135–145)
Total Bilirubin: 19.5 mg/dL (ref 0.3–1.2)
Total Protein: 4.9 g/dL — ABNORMAL LOW (ref 6.5–8.1)

## 2021-04-21 LAB — PROCALCITONIN: Procalcitonin: 0.64 ng/mL

## 2021-04-21 LAB — GLUCOSE, CAPILLARY
Glucose-Capillary: 211 mg/dL — ABNORMAL HIGH (ref 70–99)
Glucose-Capillary: 242 mg/dL — ABNORMAL HIGH (ref 70–99)
Glucose-Capillary: 278 mg/dL — ABNORMAL HIGH (ref 70–99)
Glucose-Capillary: 378 mg/dL — ABNORMAL HIGH (ref 70–99)
Glucose-Capillary: 404 mg/dL — ABNORMAL HIGH (ref 70–99)

## 2021-04-21 LAB — PROTIME-INR
INR: 3.2 — ABNORMAL HIGH (ref 0.8–1.2)
Prothrombin Time: 32.5 seconds — ABNORMAL HIGH (ref 11.4–15.2)

## 2021-04-21 LAB — CBC
HCT: 23.9 % — ABNORMAL LOW (ref 36.0–46.0)
Hemoglobin: 7.7 g/dL — ABNORMAL LOW (ref 12.0–15.0)
MCH: 32.5 pg (ref 26.0–34.0)
MCHC: 32.2 g/dL (ref 30.0–36.0)
MCV: 100.8 fL — ABNORMAL HIGH (ref 80.0–100.0)
Platelets: 47 10*3/uL — ABNORMAL LOW (ref 150–400)
RBC: 2.37 MIL/uL — ABNORMAL LOW (ref 3.87–5.11)
RDW: 26.2 % — ABNORMAL HIGH (ref 11.5–15.5)
WBC: 4.9 10*3/uL (ref 4.0–10.5)
nRBC: 0.4 % — ABNORMAL HIGH (ref 0.0–0.2)

## 2021-04-21 MED ORDER — DEXTROSE 5 % IV SOLN
10.0000 mg | Freq: Every day | INTRAVENOUS | Status: AC
  Administered 2021-04-22 – 2021-04-23 (×2): 10 mg via INTRAVENOUS
  Filled 2021-04-21 (×4): qty 1

## 2021-04-21 MED ORDER — CHLORHEXIDINE GLUCONATE CLOTH 2 % EX PADS
6.0000 | MEDICATED_PAD | Freq: Every day | CUTANEOUS | Status: DC
  Administered 2021-04-22 – 2021-04-26 (×5): 6 via TOPICAL

## 2021-04-21 NOTE — Assessment & Plan Note (Signed)
Continue levothyroxine 

## 2021-04-21 NOTE — Assessment & Plan Note (Addendum)
Echocardiogram from 2022 showed normal systolic function.  Repeat echocardiogram shows normal systolic function without any regional wall motion abnormalities.  Left ventricular diastolic parameters were indeterminate.  Mild LVH was noted.  Mild elevated troponin likely from demand ischemia.  No chest pain is reported.  Patient was placed on IV diuretics with good response.  Noted to be negative by 10 L.  Respiratory status has improved.  Changed over to oral furosemide.  Cardiology will arrange outpatient follow-up.

## 2021-04-21 NOTE — Assessment & Plan Note (Addendum)
Secondary to liver cirrhosis.  INR is 3.2.  She was given vitamin K.

## 2021-04-21 NOTE — Assessment & Plan Note (Addendum)
Has chronic anemia.  No evidence of overt bleeding.  Required multiple units of transfusion during her recent hospitalization.  Case was discussed with hematology at the time of admission.  Low fibrinogen levels not uncommonly seen in liver disease.  Low concern for hemolytic anemia at this time. Gastroenterology was consulted. Recent B12 level was 931.  Recent anemia panel was unremarkable. She was transfused 1 unit of PRBC during this hospitalization. Did not undergo endoscopy due to her respiratory status.  Discussed again with the gastroenterology once she was adequately diuresed.  Patient wants to hold off on further endoscopy.  Since she has not had any overt bleeding this is reasonable.  She can follow-up with her primary gastroenterologist.  Continue PPI. Patient knows to follow-up with PCP to have her hemoglobin checked in the next few days.

## 2021-04-21 NOTE — Progress Notes (Signed)
Inpatient Diabetes Program Recommendations  AACE/ADA: New Consensus Statement on Inpatient Glycemic Control (2015)  Target Ranges:  Prepandial:   less than 140 mg/dL      Peak postprandial:   less than 180 mg/dL (1-2 hours)      Critically ill patients:  140 - 180 mg/dL   Lab Results  Component Value Date   GLUCAP 211 (H) 04/21/2021   HGBA1C 5.8 (H) 03/18/2021    Review of Glycemic Control  Latest Reference Range & Units 04/20/21 20:13 04/21/21 08:06  Glucose-Capillary 70 - 99 mg/dL 364 (H) 680 (H)  (H): Data is abnormally high  Diabetes history: DM Outpatient Diabetes medications: Insulin pump Current orders for Inpatient glycemic control: Novolog 0-15 units tid  Inpatient Diabetes Program Recommendations:    Semglee 10 units QD  Will continue to follow while inpatient.  Thank you, Dulce Sellar, MSN, RN Diabetes Coordinator Inpatient Diabetes Program [phone] (team pager from 8a-5p)

## 2021-04-21 NOTE — Consult Note (Signed)
[facility] Consult: 12:17 PM 04/21/2021  LOS: 1 day    Referring Provider: Dr  Curly Rim  Primary Care Physician:  [facility] [place] Primary Gastroenterologist:  [facility] and more recently [place].      Reason for Consultation: Rising bilirubin in pt w cirrhosis.  Anemia.     HPI: [name] is a 56 y.o. female.  PMH alcohol abuse.  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  Pancreatitis, question chronic (no calcifications on CT in 2021) on Creon.  GERD.  Thrombocytopenia.  Panic disorder.  OSA, prn home oxygen.  Cholecystectomy 2017.  Total thyroidectomy, and Synthroid.  EGD and colonoscopy at [facility] ~ 2018.  She does not recall any of the results but denies history of polyps, diverticulosis.  Maternal history colon cancer when she was in her 61s.  So supposed to have colonoscopy every 5 years.   [facility] admission 04/05/2021 - 04/15/21 with sepsis, suspected UTI, staph simulans positive blood cultures.  Treated with meropenem.  Required pressure support with Levophed. GI saw patient for persistent ammonia elevation despite lactulose and Xifaxan as well as rising bilirubin.  She had presented to the ED with lethargy, increased sleeping despite taking lactulose and Xifaxan regularly and having 1-3 bowel movements a day.  No confusion.  Mild abd distention.  Chronic reflux with oral antacids.  Splits large pills in half due to mild pill dysphagia.  Regular bid Zofran 4 nausea but no vomiting.  Previous Reglan, has not been taking this recently.  Chronic, diffuse abdominal pain, worsens after meals but better if she takes her Creon.    Ammonia went from 94 down to 56 and mentation at her normal  baseline at discharge.  Advised to follow-up with [facility]. Labs revealed anemia, Hgb 6.5, received total 4 PRBCs.  Hgb 7.6 at discharge.  There was no iron, B12 or folate deficiency.  Given elevated total and indirect bilirubin, element of hemolysis driving bilirubin queried.  Thrombocytopenia and oozing at PICC line treated with 1 unit platelets. Ultrasounds on 1/17 and 1/20 confirm cirrhosis, perihepatic ascites, abnormal hepatofugal flow in PV.  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  Absent GB.  CBD 1.1 cm, stable.  Insufficient ascites for paracentesis.  At discharge she was advised to restart Aldactone  (which she had not been taking) at 100 mg daily and furosemide 40 mg daily (prior dose 20 mg daily).  Also added was Protonix 40 mg daily but not clear patient has started this medication.  Advised to follow-up with [facility].    Reviewed 11/26/2020 [facility] office note stating "she would not be a good liver transplant candidate given her comorbid conditions".   05/03/2019 CTAP w cirrhosis, portal hypertension.  Enlarged PV with significant portosystemic shunting.  Gallbladder absent.  Stable significant extrahepatic biliary ductal dilatation.  Small volume abdominal ascites and mild body wall edema."  Presented via EMS to [facility] w dyspnea, cough, nausea without emesis.  No  tarry or bloody stools, no bloody emesis.  Chronic nausea and dry heaves.  Still prefers to take antacids for relief of GERD symptoms.  No change in chronic abdominal pain.  Takes oxycodone 10 mg qid.  No NSAIDs or aspirin products.  Chronic heaviness in her legs with swelling.  Very weak legs so she uses either a rolling walker, cane or wheelchair for mobility.  Hgb 7.7.  T bili 19.5, was 20.7 when admitted and 17.9 at discharge last week.  Indirect bilirubin 11.8.  Alkaline phosphatase, transaminases normal. Glucose is 262.  BUN 39, was in the teens during recent admission.  Creat normal.    CT of chest shows groundglass opacities bil lower  lobes concerning for pna vs edema.  Small to moderate pleural effusions bilaterally.  Small amount of ascites at right upper quadrant.  Anasarca.  Changes of cirrhosis and portal hypertension. echocardiogram this morning (no report yet) but echocardiogram of May 2022 showed LVEF 65 to 70%, no significant valvular disease.   Lives alone most of the time.  She has a significant other who splits his time between her house and his own home.  A brother in [place] who she does not see.  No children.  Reports a lot of clutter in her house which inhibits her movement within the house.  Has not been able to find someone to help her clear the debris. No alcohol for several years.  Past Medical History:  Diagnosis Date   Degenerative joint disease    Diabetes mellitus without complication ([place])    Gastroesophageal reflux    Pancreatitis    Panic attack    Sleep apnea    Thrombocytopenia (H[facility])     Past Surgical History:  Procedure Laterality Date   CHOLECYSTECTOMY     SHOULDER SURGERY     left    tongue sx. titanium screw placed to hold tongue down     TOTAL THYROIDECTOMY      Prior to Admission medications   Medication Sig Start Date End Date Taking? Authorizing Provider  albuterol (PROVENTIL) (2.5 MG/3ML) 0.083% nebulizer solution Inhale 3 mLs (2.5 mg total) into the lungs every 6 (six) hours as needed for wheezing or shortness of breath. 04/15/21   Denton Brick, Courage, MD  albuterol (VENTOLIN HFA) 108 (90 Base) MCG/ACT inhaler Inhale 2 puffs into the lungs every 4 (four) hours as needed for wheezing or shortness of breath. 04/15/21   Denton Brick, Courage, MD  alendronate (FOSAMAX) 70 MG tablet Take 70 mg by mouth once a week. 03/11/21   [provider]  Continuous Blood Gluc Sensor (DEXCOM G6 SENSOR) MISC Inject into the skin See admin instructions. Place a new sensor under the skin every 10 days    [provider]  furosemide (LASIX) 20 MG tablet Take 1 tablet (20 mg total) by  mouth 2 (two) times daily. 04/15/21   Roxan Hockey, MD  insulin aspart (NOVOLOG) 100 UNIT/ML injection Via Insulin Pump 07/31/20   Johnson, Clanford L, MD  lactulose (CHRONULAC) 10 GM/15ML solution Take 45 mLs (30 g total) by mouth 3 (three) times daily. Take 30 grams (45 ml's) by mouth three to five times a day until 3 bowel movements a day are achieved, then as directed 07/31/20   Murlean Iba, MD  levothyroxine (SYNTHROID, LEVOTHROID) 50 MCG tablet Take 50 mcg by mouth daily before breakfast.  07/25/14   [provider]  lipase/protease/amylase (CREON) 12000-38000 units CPEP capsule Take 4 capsules (48,000 Units total) by mouth  3 (three) times daily before meals. 04/15/21   Roxan Hockey, MD  Magnesium 400 MG TABS Take 400 mg by mouth daily. 04/15/21   Roxan Hockey, MD  magnesium oxide (MAG-OX) 400 MG tablet Take 400 mg by mouth daily. 04/15/21   [provider]  ondansetron (ZOFRAN ODT) 4 MG disintegrating tablet Take 1 tablet (4 mg total) by mouth See admin instructions. Dissolve 4 mg orally one to two times a day Patient taking differently: Take 4 mg by mouth every 8 (eight) hours as needed. 07/31/20   Johnson, Clanford L, MD  Oxycodone HCl 10 MG TABS Take 10 mg by mouth every 6 (six) hours as needed (pain).    [provider]  OXYGEN Inhale 2-4 L/min into the lungs as needed (for shortness of breath).    [provider]  pantoprazole (PROTONIX) 40 MG tablet Take 1 tablet (40 mg total) by mouth daily. 04/16/21   Roxan Hockey, MD  potassium chloride SA (KLOR-CON M) 20 MEQ tablet Take 1 tablet (20 mEq total) by mouth 2 (two) times daily. 04/15/21   Roxan Hockey, MD  spironolactone (ALDACTONE) 100 MG tablet Take 1 tablet (100 mg total) by mouth daily. 04/16/21   Roxan Hockey, MD  Vitamin D, Ergocalciferol, (DRISDOL) 1.25 MG (50000 UNIT) CAPS capsule Take 50,000 Units by mouth every Wednesday. 06/29/20   [provider]  XIFAXAN 550 MG  TABS tablet Take 550 mg by mouth 2 (two) times daily. 05/28/20   [provider]  lisinopril (ZESTRIL) 10 MG tablet Take 10 mg by mouth daily. Patient not taking: Reported on 10/02/2019 03/29/19 10/02/19  [provider]  metFORMIN (GLUCOPHAGE) 1000 MG tablet Take 1,000 mg by mouth 2 (two) times daily with a meal.  Patient not taking: Reported on 10/02/2019  10/02/19  [provider]  metoCLOPramide (REGLAN) 10 MG tablet Take 10 mg by mouth 4 (four) times daily.  Patient not taking: Reported on 10/02/2019  10/02/19  [provider]  sertraline (ZOLOFT) 100 MG tablet Take 150 mg by mouth daily. Patient not taking: Reported on 10/02/2019 09/10/16 10/02/19  [provider]  traZODone (DESYREL) 100 MG tablet Take 1 tablet by mouth daily. Patient not taking: Reported on 10/02/2019 09/15/16 10/02/19  [provider]    Scheduled Meds:  furosemide  20 mg Intravenous BID   insulin aspart  0-15 Units Subcutaneous TID WC   lactulose  30 g Oral TID   levothyroxine  50 mcg Oral QAC breakfast   lipase/protease/amylase  48,000 Units Oral TID AC   magnesium oxide  400 mg Oral Daily   pantoprazole  40 mg Oral Daily   rifaximin  550 mg Oral BID   spironolactone  100 mg Oral Daily   Vitamin D (Ergocalciferol)  50,000 Units Oral Q Wed   Infusions:  PRN Meds: albuterol, oxyCODONE   Allergies as of 04/19/2021 - Review Complete 04/06/2021  Allergen Reaction Noted   Acetaminophen Hives and Other (See Comments) 08/10/2012   Cephalexin Itching, Swelling, and Other (See Comments) 12/25/2014   Ciprofloxacin Hives 08/10/2012   Hydromorphone Hives 01/17/2014   Sulfamethoxazole-trimethoprim Shortness Of Breath, Swelling, and Other (See Comments) 08/10/2012   Tizanidine Anaphylaxis 03/16/2021   Abilify [aripiprazole]  06/06/2020    No family history on file.  Social History   Socioeconomic History   Marital status: Single    Spouse name: Not on file   Number  of children: Not on file   Years of education: Not on file  Highest education level: Not on file  Occupational History   Not on file  Tobacco Use   Smoking status: Never   Smokeless tobacco: Never  Vaping Use   Vaping Use: Never used  Substance and Sexual Activity   Alcohol use: No   Drug use: No   Sexual activity: Not Currently  Other Topics Concern   Not on file  Social History Narrative   Not on file   Social Determinants of Health   Financial Resource Strain: Not on file  Food Insecurity: Not on file  Transportation Needs: Not on file  Physical Activity: Not on file  Stress: Not on file  Social Connections: Not on file  Intimate Partner Violence: Not on file    REVIEW OF SYSTEMS: Constitutional: General weakness.  Able to walk short distances using a cane or rolling walker but sometimes uses a wheelchair. ENT:  No nose bleeds Pulm: Dyspnea without cough. CV: Positive LE swelling.  No palpitations, no angina. GU:  No hematuria, no frequency GI: See HPI. Heme: Denies unusual or excessive bleeding.  Bruises easily. Transfusions: As per HPI.  Patient also recalls blood transfusion for anemia without bleeding a few years ago. Neuro:  No headaches, no peripheral tingling or numbness.  No dizziness.  No syncope.  No seizures. Derm:  No itching, no rash or sores.  Endocrine:  No sweats or chills.  No polyuria or dysuria Immunization: Reviewed.  She is up-to-date on multiple vaccines and has had at least 2 Joseph vaccines Travel:  None beyond local counties in last few months.    PHYSICAL EXAM: Vital signs in last 24 hours: Vitals:   04/21/21 0400 04/21/21 0942  BP: (!) 121/55 (!) 129/58  Pulse: 81 80  Resp: 17 17  Temp: 97.6 F (36.4 C)   SpO2: 96% 95%   Wt Readings from Last 3 Encounters:  04/15/21 65.6 kg  03/17/21 58.9 kg  02/20/21 54 kg    General: Patient is chronically ill.  Looks older than stated age.  Mild jaundice, not as yellow as would  be expected based on current bilirubin level.  Comfortable, pleasant, alert.  Provides good history. Head: No facial asymmetry or swelling.  No signs of head trauma. Eyes: No conjunctival pallor.  Mild scleral icterus. Ears: Not hard of hearing Nose: No congestion or discharge Mouth: Oral mucosa moist, pink, clear.  Tongue midline. Neck: No JVD, no masses, no thyromegaly Lungs: Crackles in the bases.  No dyspnea at rest or with speech.  No cough Heart: RRR. Abdomen: Soft, nondistended.  No fluid wave or obvious ascites.  Tenderness is mild to moderate diffusely without guarding or rebound.  Bowel sounds normal, active..   Rectal: Light brown, soft stool is FOBT negative. Musc/Skeltl: No joint redness, swelling or significant deformity. Extremities: Pedal edema bilaterally Neurologic: Pleasant, calm.  No tremors, no asterixis.  Moves all 4 limbs, formal strength testing not performed Skin: Slight jaundice again not as pronounced as would be expected based on labs Tattoos: Present Nodes: No cervical adenopathy Psych: Calm, cooperative, pleasant.  Fluid speech.  Intake/Output from previous day: 01/31 0701 - 02/01 0700 In: -  Out: 1251 [Urine:1250; Stool:1] Intake/Output this shift: Total I/O In: -  Out: 750 [Urine:750]  LAB RESULTS: Recent Labs    04/20/21 1857 04/21/21 0440  WBC 6.7 4.9  HGB 8.3* 7.7*  HCT 25.8* 23.9*  PLT 51* 47*   BMET Lab Results  Component Value Date   NA 137 04/21/2021  NA 136 04/20/2021   NA 139 04/15/2021   K 4.4 04/21/2021   K 5.0 04/20/2021   K 3.5 04/15/2021   CL 100 04/21/2021   CL 100 04/20/2021   CL 103 04/15/2021   CO2 27 04/21/2021   CO2 28 04/20/2021   CO2 29 04/15/2021   GLUCOSE 262 (H) 04/21/2021   GLUCOSE 219 (H) 04/20/2021   GLUCOSE 176 (H) 04/15/2021   BUN 39 (H) 04/21/2021   BUN 39 (H) 04/20/2021   BUN 17 04/15/2021   CREATININE 0.90 04/21/2021   CREATININE 1.21 (H) 04/20/2021   CREATININE <0.30 (L) 04/15/2021    CALCIUM 9.2 04/21/2021   CALCIUM 9.2 04/20/2021   CALCIUM 9.2 04/15/2021   LFT Recent Labs    04/20/21 1857 04/21/21 0440  PROT 5.2* 4.9*  ALBUMIN 2.9* 2.7*  AST 36 34  ALT 17 16  ALKPHOS 63 60  BILITOT 20.0* 19.5*   PT/INR Lab Results  Component Value Date   INR 3.2 (H) 04/21/2021   INR 3.2 (H) 04/20/2021   INR 2.3 (H) 04/14/2021   Hepatitis Panel No results for input(s): HEPBSAG, HCVAB, HEPAIGM, HEPBIGM in the last 72 hours. C-Diff No components found for: CDIFF Lipase     Component Value Date/Time   LIPASE 59 (H) 04/06/2021 0727    Drugs of Abuse     Component Value Date/Time   LABOPIA POSITIVE (A) 03/17/2021 1512   COCAINSCRNUR NONE DETECTED 03/17/2021 1512   LABBENZ NONE DETECTED 03/17/2021 1512   AMPHETMU NONE DETECTED 03/17/2021 1512   THCU NONE DETECTED 03/17/2021 1512   LABBARB NONE DETECTED 03/17/2021 1512     RADIOLOGY STUDIES: CT CHEST WO CONTRAST  Result Date: 04/20/2021 CLINICAL DATA:  Pneumonia, complication suspected. EXAM: CT CHEST WITHOUT CONTRAST TECHNIQUE: Multidetector CT imaging of the chest was performed following the standard protocol without IV contrast. RADIATION DOSE REDUCTION: This exam was performed according to the departmental dose-optimization program which includes automated exposure control, adjustment of the mA and/or kV according to patient size and/or use of iterative reconstruction technique. COMPARISON:  04/11/2021. FINDINGS: Cardiovascular: The heart is enlarged and there is no pericardial effusion. Scattered coronary artery calcifications are noted. There is atherosclerotic calcification of the aorta without evidence of aneurysm. The pulmonary trunk is normal in caliber. Mediastinum/Nodes: Shotty lymph nodes are present in the mediastinum. No axillary lymphadenopathy. Evaluation of the hila is limited due to lack of IV contrast. Surgical clips are present in the thyroid bed on the left. Hyperdense and hypodense nodules and  calcifications are present in the right lobe of the thyroid gland. The trachea and esophagus are within normal limits. Lungs/Pleura: Patchy ground-glass opacities are present in the lungs bilaterally. There is consolidation in the lower lobes bilaterally. There are small to moderate bilateral pleural effusions. No pneumothorax is seen. Upper Abdomen: Cirrhotic changes are noted in the liver. A cystic lesion is present in the right lobe of the liver measuring 1.2 cm. The gallbladder is surgically absent. The spleen is enlarged. Mesenteric edema is noted and there is a small amount ascites in the right upper quadrant. Musculoskeletal: Anasarca is noted. A stable compression deformity is noted in the superior endplate of T7. No acute osseous abnormality. IMPRESSION: 1. Patchy ground-glass opacities in the lungs bilaterally with consolidation in the lower lobes, concerning for pneumonia versus edema. 2. Small to moderate bilateral pleural effusions. 3. Cardiomegaly with coronary artery calcifications. 4. Morphologic changes of cirrhosis and portal hypertension. 5. Small ascites in the right upper quadrant and  anasarca. 6. Aortic atherosclerosis. Electronically Signed   By: Brett Fairy M.D.   On: 04/20/2021 23:29      IMPRESSION:   Chronic anemia  Cirrhosis of the liver with marked elevation bilirubin    Hx HE.  No signs of acute encephalopathy.  On stable doses Xifaxan, lactulose  Elevated troponin I at 172.  2D echo performed earlier today no reading available yet.  Chest CT shows pneumonia vs edema in lower lobes as well as bilateral groundglass opacities and moderate pleural effusions.  Chronic nausea, dry heaves and abdominal pain.  Longstanding.  Not on PPI.  Stable 4 times daily doses oxycodone  Type II, IDDM.    PLAN:        EGD?  Do not think she could tolerate a bowel prep for colonoscopy.    ? CT or MR imaging of liver? As screen for [facility].    Given issues with pill dysphagia,  question safety of weekly oral Fosamax.  Additionally given overall prognosis, question utility of this medication.   Azucena Freed  04/21/2021, 12:17 PM Phone [phone]

## 2021-04-21 NOTE — Assessment & Plan Note (Addendum)
HbA1c 5.8 in December.  Occasional high readings noted in glucose levels likely from dietary noncompliance.   May resume her insulin pump at home.

## 2021-04-21 NOTE — Hospital Course (Addendum)
56 y.o. female with medical history significant of NASH cirrhosis, hypertension, chronic normocytic anemia secondary to chronic illness, IDDM, chronic pancreatic insufficiency, chronic abdominal pain, chronic thrombocytopenia, chronic hypoxic respiratory failure baseline 2 L, presented with cough and increasing shortness of breath. Patient was recently hospitalized for ESBL UTI for which patient completed 7 days of meropenem treatment, same time patient also found to have worsening of anemia and received a total of 5 unit PRBC and last hemoglobin level 7.0 on discharge at [facility].  Evaluation in the emergency department raised concern for pulmonary edema.  She was hospitalized for further management.  Gastroenterology and cardiology were consulted.  No overt bleeding was noted.  Due to her other comorbidities she was thought to be high risk for procedures.  There is no clear indication due to the same.  Gastroenterology has signed off. She was diuresed intravenously for acute diastolic CHF which is also improved.

## 2021-04-21 NOTE — Assessment & Plan Note (Addendum)
Secondary to liver cirrhosis.  Stable.

## 2021-04-21 NOTE — Assessment & Plan Note (Addendum)
NASH cirrhosis.  Worsening bilirubin noted.  According to [facility] patient is not a candidate for liver transplant.  Continue to monitor. Continue spironolactone.  Appreciate gastroenterology input.  Patient underwent MRI abdomen with MRCP.  Dilated CBD was noted.  No intervention recommended by gastroenterology since these findings were chronic.

## 2021-04-21 NOTE — Assessment & Plan Note (Addendum)
Ammonia level was noted to be elevated.  Patient was given higher doses of lactulose and rifaximin was continued.   Ammonia level has been stable.  Mentation has improved and seems to be close to her baseline now.

## 2021-04-21 NOTE — Assessment & Plan Note (Addendum)
Pleural effusion likely from CHF.  Recommend repeating chest x-ray in the outpatient setting in 1 to 2 weeks.  Respiratory status has improved.  She is on 4 L of oxygen at baseline at home.

## 2021-04-21 NOTE — Progress Notes (Signed)
[facility] PROGRESS NOTE   [name] RSW:[id number] DOB: November 14, 1965 DOA: 04/20/2021  1 DOS: the patient was seen and examined on 04/21/2021  [facility]P: [facility]  Brief History and Hospital Course:  56 y.o. female with medical history significant of NASH cirrhosis, hypertension, chronic normocytic anemia secondary to chronic illness, IDDM, chronic pancreatic insufficiency, chronic abdominal pain, chronic thrombocytopenia, chronic hypoxic respiratory failure baseline 2 L, presented with cough and increasing shortness of breath. Patient was recently hospitalized for ESBL UTI for which patient completed 7 days of meropenem treatment, same time patient also found to have worsening of anemia and received a total of 5 unit PRBC and last hemoglobin level 7.0 on discharge at [facility].  Evaluation raise concern for pulmonary edema.  She was hospitalized for further management.  Consultants: None  Procedures: None    Subjective: Patient mentions that she is feeling slightly better this morning in terms of shortness of breath.  Denies any chest pain.  No nausea vomiting.    Assessment/Plan:  Assessment and Plan: * Acute on chronic respiratory failure with hypoxia (HCC) Currently on 5 L of oxygen via nasal cannula.  Saturations noted to be in the late 90s.  Decreased to 4 L of oxygen which is what she uses at home. Noted to have bilateral pleural effusions.  Acute on chronic diastolic CHF (congestive heart failure) ([place])- (present on admission) Echocardiogram from 2022 showed normal systolic function.  Repeat echocardiogram is pending.  Strict ins and outs and daily weights.  Patient is on furosemide 20 mg twice a day.  Monitor urine output.  Patient mentions improvement in her dyspnea. Elevated troponin noted..  Likely from demand ischemia.  Follow-up on echocardiogram.  Patient denies any chest pain currently.  Macrocytic anemia- (present on admission) Has  chronic anemia.  No evidence of overt bleeding.  Required multiple units of transfusion during her recent hospitalization.  Case was discussed with hematology at the time of admission.  Stool for occult blood was negative on 1/17. According to records from [facility] her last endoscopy/colonoscopy on more than 5 years ago.  No inpatient work-up was recommended by GI.  Monitor hemoglobin.  Transfuse if it drops below 7. LDH is noted to be 287.  Recent B12 level was 931.  Recent anemia panel was unremarkable.  Thrombocytopenia ([place])- (present on admission) Likely secondary to liver cirrhosis.  Stable.  Chronic liver failure without hepatic coma ([place])- (present on admission) NASH cirrhosis.  Worsening bilirubin noted.  According to [facility] patient is not a candidate for liver transplant.  Continue to monitor. Continue spironolactone.  Continue rifaximin.  Coagulopathy ([place])- (present on admission) Secondary to liver cirrhosis.  INR is 3.2.  Hyperammonemia ([place])- (present on admission) Continue lactulose.  Diabetes mellitus without complication (HCC) Monitor CBGs.  Continue SSI.  Acquired hypothyroidism- (present on admission) Continue levothyroxine.    DVT Prophylaxis: Auto anticoagulated Code Status: DNR Family Communication: Discussed with patient.  No family at bedside Disposition Plan: Return home when improved.  Status is: Inpatient Remains inpatient appropriate because: Acute on chronic respiratory failure with hypoxia, acute diastolic CHF  Planned Discharge Destination: Home with Home Health           Medications: Scheduled:  furosemide  20 mg Intravenous BID   insulin aspart  0-15 Units Subcutaneous TID WC   lactulose  30 g Oral TID   levothyroxine  50 mcg Oral QAC breakfast   lipase/protease/amylase  48,000 Units Oral TID AC  magnesium oxide  400 mg Oral Daily   pantoprazole  40 mg Oral Daily   rifaximin  550 mg Oral BID   spironolactone  100 mg Oral  Daily   Vitamin D (Ergocalciferol)  50,000 Units Oral Q Wed   Continuous: HQP:[id number], oxyCODONE  Antibiotics: Anti-infectives (From admission, onward)    Start     Dose/Rate Route Frequency Ordered Stop   04/20/21 2200  rifaximin (XIFAXAN) tablet 550 mg        550 mg Oral 2 times daily 04/20/21 1757         Objective:  Vital Signs  Vitals:   04/20/21 2105 04/21/21 0040 04/21/21 0400 04/21/21 0942  BP: (!) 113/54 (!) 125/58 (!) 121/55 (!) 129/58  Pulse: 84 84 81 80  Resp: (!) 25 17 17 17   Temp: 98 F (36.7 C)  97.6 F (36.4 C)   TempSrc: Oral  Axillary   SpO2: 97% 92% 96% 95%    Intake/Output Summary (Last 24 hours) at 04/21/2021 1226 Last data filed at 04/21/2021 0900 Gross per 24 hour  Intake --  Output 2001 ml  Net -2001 ml   There were no vitals filed for this visit.  General appearance: Awake alert.  In no distress Resp: Crackles bilateral bases.  No wheezing or rhonchi.  Tachypneic at rest.  No use of accessory muscles. Cardio: S1-S2 is normal regular.  No S3-S4.  No rubs murmurs or bruit GI: Abdomen is soft.  Nontender nondistended.  Bowel sounds are present normal.  No masses organomegaly Extremities: Bilateral pedal edema Neurologic:  No focal neurological deficits.    Lab Results:  Data Reviewed: I have personally reviewed labs and imaging study reports  CBC: Recent Labs  Lab 04/15/21 0543 04/20/21 1857 04/21/21 0440  WBC 4.5 6.7 4.9  HGB 7.6* 8.3* 7.7*  HCT 23.1* 25.8* 23.9*  MCV 99.1 101.6* 100.8*  PLT 40* 51* 47*    Basic Metabolic Panel: Recent Labs  Lab 04/15/21 0543 04/20/21 1857 04/21/21 0440  NA 139 136 137  K 3.5 5.0 4.4  CL 103 100 100  CO2 29 28 27   GLUCOSE 176* 219* 262*  BUN 17 39* 39*  CREATININE <0.30* 1.21* 0.90  CALCIUM 9.2 9.2 9.2    GFR: Estimated Creatinine Clearance: 62.8 mL/min (by C-G formula based on SCr of 0.9 mg/dL).  Liver Function Tests: Recent Labs  Lab 04/15/21 0543 04/20/21 1857  04/21/21 0440  AST 44* 36 34  ALT 17 17 16   ALKPHOS 77 63 60  BILITOT 17.9* 20.0* 19.5*  PROT 5.5* 5.2* 4.9*  ALBUMIN 3.5 2.9* 2.7*    Coagulation Profile: Recent Labs  Lab 04/20/21 1857 04/21/21 0440  INR 3.2* 3.2*    CBG: Recent Labs  Lab 04/14/21 2141 04/15/21 0721 04/15/21 1114 04/20/21 2013 04/21/21 0806  GLUCAP 205* 184* 305* 231* 211*     Radiology Studies: CT CHEST WO CONTRAST  Result Date: 04/20/2021 CLINICAL DATA:  Pneumonia, complication suspected. EXAM: CT CHEST WITHOUT CONTRAST TECHNIQUE: Multidetector CT imaging of the chest was performed following the standard protocol without IV contrast. RADIATION DOSE REDUCTION: This exam was performed according to the departmental dose-optimization program which includes automated exposure control, adjustment of the mA and/or kV according to patient size and/or use of iterative reconstruction technique. COMPARISON:  04/11/2021. FINDINGS: Cardiovascular: The heart is enlarged and there is no pericardial effusion. Scattered coronary artery calcifications are noted. There is atherosclerotic calcification of the aorta without evidence of aneurysm. The pulmonary trunk is  normal in caliber. Mediastinum/Nodes: Shotty lymph nodes are present in the mediastinum. No axillary lymphadenopathy. Evaluation of the hila is limited due to lack of IV contrast. Surgical clips are present in the thyroid bed on the left. Hyperdense and hypodense nodules and calcifications are present in the right lobe of the thyroid gland. The trachea and esophagus are within normal limits. Lungs/Pleura: Patchy ground-glass opacities are present in the lungs bilaterally. There is consolidation in the lower lobes bilaterally. There are small to moderate bilateral pleural effusions. No pneumothorax is seen. Upper Abdomen: Cirrhotic changes are noted in the liver. A cystic lesion is present in the right lobe of the liver measuring 1.2 cm. The gallbladder is surgically  absent. The spleen is enlarged. Mesenteric edema is noted and there is a small amount ascites in the right upper quadrant. Musculoskeletal: Anasarca is noted. A stable compression deformity is noted in the superior endplate of T7. No acute osseous abnormality. IMPRESSION: 1. Patchy ground-glass opacities in the lungs bilaterally with consolidation in the lower lobes, concerning for pneumonia versus edema. 2. Small to moderate bilateral pleural effusions. 3. Cardiomegaly with coronary artery calcifications. 4. Morphologic changes of cirrhosis and portal hypertension. 5. Small ascites in the right upper quadrant and anasarca. 6. Aortic atherosclerosis. Electronically Signed   By: Brett Fairy M.D.   On: 04/20/2021 23:29       LOS: 1 day   [facility] Pager on [email or web address]  04/21/2021, 12:26 PM

## 2021-04-21 NOTE — Plan of Care (Signed)

## 2021-04-21 NOTE — Plan of Care (Signed)
°  Problem: Education: Goal: Knowledge of General Education information will improve Description: Including pain rating scale, medication(s)/side effects and non-pharmacologic comfort measures Outcome: Not Progressing   Problem: Health Behavior/Discharge Planning: Goal: Ability to manage health-related needs will improve Outcome: Not Progressing   Problem: Clinical Measurements: Goal: Ability to maintain clinical measurements within normal limits will improve Outcome: Not Progressing Goal: Will remain free from infection Outcome: Not Progressing Goal: Diagnostic test results will improve Outcome: Not Progressing Goal: Respiratory complications will improve Outcome: Not Progressing Goal: Cardiovascular complication will be avoided Outcome: Not Progressing   Problem: Nutrition: Goal: Adequate nutrition will be maintained Outcome: Not Progressing   Problem: Activity: Goal: Risk for activity intolerance will decrease Outcome: Not Progressing   Problem: Elimination: Goal: Will not experience complications related to bowel motility Outcome: Not Progressing Goal: Will not experience complications related to urinary retention Outcome: Not Progressing

## 2021-04-22 ENCOUNTER — Inpatient Hospital Stay (HOSPITAL_COMMUNITY): Payer: Medicare Other

## 2021-04-22 DIAGNOSIS — I5033 Acute on chronic diastolic (congestive) heart failure: Secondary | ICD-10-CM | POA: Diagnosis not present

## 2021-04-22 DIAGNOSIS — Z01810 Encounter for preprocedural cardiovascular examination: Secondary | ICD-10-CM | POA: Diagnosis not present

## 2021-04-22 DIAGNOSIS — D539 Nutritional anemia, unspecified: Secondary | ICD-10-CM | POA: Diagnosis not present

## 2021-04-22 DIAGNOSIS — R109 Unspecified abdominal pain: Secondary | ICD-10-CM | POA: Diagnosis not present

## 2021-04-22 DIAGNOSIS — J9621 Acute and chronic respiratory failure with hypoxia: Secondary | ICD-10-CM | POA: Diagnosis not present

## 2021-04-22 DIAGNOSIS — K7210 Chronic hepatic failure without coma: Secondary | ICD-10-CM | POA: Diagnosis not present

## 2021-04-22 DIAGNOSIS — D696 Thrombocytopenia, unspecified: Secondary | ICD-10-CM | POA: Diagnosis not present

## 2021-04-22 DIAGNOSIS — Z01818 Encounter for other preprocedural examination: Secondary | ICD-10-CM

## 2021-04-22 LAB — COMPREHENSIVE METABOLIC PANEL
ALT: 19 U/L (ref 0–44)
AST: 40 U/L (ref 15–41)
Albumin: 2.6 g/dL — ABNORMAL LOW (ref 3.5–5.0)
Alkaline Phosphatase: 66 U/L (ref 38–126)
Anion gap: 10 (ref 5–15)
BUN: 21 mg/dL — ABNORMAL HIGH (ref 6–20)
CO2: 29 mmol/L (ref 22–32)
Calcium: 9.1 mg/dL (ref 8.9–10.3)
Chloride: 99 mmol/L (ref 98–111)
Creatinine, Ser: 0.46 mg/dL (ref 0.44–1.00)
GFR, Estimated: >60 mL/min (ref >60)
Glucose, Bld: 231 mg/dL — ABNORMAL HIGH (ref 70–99)
Potassium: 3.6 mmol/L (ref 3.5–5.1)
Sodium: 138 mmol/L (ref 135–145)
Total Bilirubin: 19.6 mg/dL (ref 0.3–1.2)
Total Protein: 4.9 g/dL — ABNORMAL LOW (ref 6.5–8.1)

## 2021-04-22 LAB — CBC
HCT: 24.3 % — ABNORMAL LOW (ref 36.0–46.0)
Hemoglobin: 8.2 g/dL — ABNORMAL LOW (ref 12.0–15.0)
MCH: 33.2 pg (ref 26.0–34.0)
MCHC: 33.7 g/dL (ref 30.0–36.0)
MCV: 98.4 fL (ref 80.0–100.0)
Platelets: 44 10*3/uL — ABNORMAL LOW (ref 150–400)
RBC: 2.47 MIL/uL — ABNORMAL LOW (ref 3.87–5.11)
RDW: 25.3 % — ABNORMAL HIGH (ref 11.5–15.5)
WBC: 3.6 10*3/uL — ABNORMAL LOW (ref 4.0–10.5)
nRBC: 0 % (ref 0.0–0.2)

## 2021-04-22 LAB — GLUCOSE, CAPILLARY
Glucose-Capillary: 167 mg/dL — ABNORMAL HIGH (ref 70–99)
Glucose-Capillary: 145 mg/dL — ABNORMAL HIGH (ref 70–99)
Glucose-Capillary: 263 mg/dL — ABNORMAL HIGH (ref 70–99)
Glucose-Capillary: 180 mg/dL — ABNORMAL HIGH (ref 70–99)

## 2021-04-22 LAB — PROTIME-INR
INR: 3 — ABNORMAL HIGH (ref 0.8–1.2)
Prothrombin Time: 31 seconds — ABNORMAL HIGH (ref 11.4–15.2)

## 2021-04-22 LAB — AFP TUMOR MARKER: AFP, Serum, Tumor Marker: 5.3 ng/mL (ref 0.0–9.2)

## 2021-04-22 LAB — FIBRINOGEN: Fibrinogen: <60 mg/dL — CL (ref 210–475)

## 2021-04-22 LAB — ABO/RH: ABO/RH(D): A POS

## 2021-04-22 LAB — PROCALCITONIN: Procalcitonin: 0.4 ng/mL

## 2021-04-22 MED ORDER — MORPHINE SULFATE (PF) 2 MG/ML IV SOLN
2.0000 mg | Freq: Once | INTRAVENOUS | Status: AC
  Administered 2021-04-22: 2 mg via INTRAVENOUS
  Filled 2021-04-22: qty 1

## 2021-04-22 MED ORDER — SODIUM CHLORIDE 0.9% IV SOLUTION: Freq: Once | INTRAVENOUS | Status: AC

## 2021-04-22 MED ORDER — GADOBUTROL 1 MMOL/ML IV SOLN
6.5000 mL | Freq: Once | INTRAVENOUS | Status: AC | PRN
  Administered 2021-04-22: 6.5 mL via INTRAVENOUS

## 2021-04-22 NOTE — Plan of Care (Signed)
°  Problem: Education: Goal: Knowledge of General Education information will improve Description: Including pain rating scale, medication(s)/side effects and non-pharmacologic comfort measures Outcome: Not Progressing   Problem: Health Behavior/Discharge Planning: Goal: Ability to manage health-related needs will improve Outcome: Not Progressing   Problem: Clinical Measurements: Goal: Ability to maintain clinical measurements within normal limits will improve Outcome: Not Progressing Goal: Will remain free from infection Outcome: Not Progressing Goal: Diagnostic test results will improve Outcome: Not Progressing Goal: Respiratory complications will improve Outcome: Not Progressing Goal: Cardiovascular complication will be avoided Outcome: Not Progressing   Problem: Activity: Goal: Risk for activity intolerance will decrease Outcome: Not Progressing   Problem: Nutrition: Goal: Adequate nutrition will be maintained Outcome: Not Progressing   Problem: Coping: Goal: Level of anxiety will decrease Outcome: Not Progressing   Problem: Elimination: Goal: Will not experience complications related to bowel motility Outcome: Not Progressing Goal: Will not experience complications related to urinary retention Outcome: Not Progressing   Problem: Pain Managment: Goal: General experience of comfort will improve Outcome: Not Progressing   Problem: Skin Integrity: Goal: Risk for impaired skin integrity will decrease Outcome: Not Progressing   Problem: Safety: Goal: Non-violent Restraint(s) Outcome: Not Progressing

## 2021-04-22 NOTE — Progress Notes (Signed)
[facility] PROGRESS NOTE   SHANEQUE MERKLE FUX:[id number] DOB: Feb 06, 1966 DOA: 04/20/2021  2 DOS: the patient was seen and examined on 04/22/2021  PCP: [facility]  Brief History and Hospital Course:  56 y.o. female with medical history significant of NASH cirrhosis, hypertension, chronic normocytic anemia secondary to chronic illness, IDDM, chronic pancreatic insufficiency, chronic abdominal pain, chronic thrombocytopenia, chronic hypoxic respiratory failure baseline 2 L, presented with cough and increasing shortness of breath. Patient was recently hospitalized for ESBL UTI for which patient completed 7 days of meropenem treatment, same time patient also found to have worsening of anemia and received a total of 5 unit PRBC and last hemoglobin level 7.0 on discharge at [facility].  Evaluation in the emergency department raised concern for pulmonary edema.  She was hospitalized for further management.  Consultants: Gastroenterology.  Phone discussion with hematology by admitting team  Procedures: None    Subjective: Patient noted to be somewhat confused this morning.  Was able to answer correctly as to where she was.  Did not know the year and month.  Had to be restrained overnight.  Discussed with nursing staff will remove restraints this morning and observe.  Complains of abdominal pain which is chronic.   Assessment/Plan:   * Acute on chronic respiratory failure with hypoxia (HCC) Remains on 4 to 5 L of oxygen by nasal cannula.  Has bilateral pleural effusions.  Continue to monitor.    Acute on chronic diastolic CHF (congestive heart failure) ([place])- (present on admission) Echocardiogram from 2022 showed normal systolic function.  Repeat echocardiogram shows normal systolic function without any regional wall motion abnormalities.  Left ventricular diastolic parameters were indeterminate.  Mild LVH was noted.  Mild elevated troponin likely from demand  ischemia.  No chest pain is reported.  Continue IV diuretics for now.  Do not anticipate any further cardiac work-up.    Macrocytic anemia- (present on admission) Has chronic anemia.  No evidence of overt bleeding.  Required multiple units of transfusion during her recent hospitalization.  Case was discussed with hematology at the time of admission.  Low fibrinogen levels not uncommonly seen in liver disease.  Low concern for hemolytic anemia at this time. Gastroenterology was consulted and they are pursuing GI work-up. Hemoglobin stable for the most part.  No overt bleeding noted. Recent B12 level was 931.  Recent anemia panel was unremarkable.  Thrombocytopenia ([place])- (present on admission) Secondary to liver cirrhosis.  Stable.  Chronic liver failure without hepatic coma ([place]- (present on admission) NASH cirrhosis.  Worsening bilirubin noted.  According to [facility] patient is not a candidate for liver transplant.  Continue to monitor. Continue spironolactone.  Continue rifaximin. Noted to have developed some confusion overnight.  We will check ammonia level.  Coagulopathy ([place])- (present on admission) Secondary to liver cirrhosis.  INR is 3.2.  Hyperammonemia ([place])- (present on admission) Continue lactulose.  See under chronic liver failure.  Diabetes mellitus without complication ([place]) GUR4Y 5.8 in December.  Occasional high readings noted in glucose levels likely from dietary noncompliance.  Stable this morning.  Continue to monitor without any changes.  Continue SSI.    Acquired hypothyroidism- (present on admission) Continue levothyroxine.    DVT Prophylaxis: Auto anticoagulated Code Status: DNR Family Communication: Discussed with patient.  No family at bedside Disposition Plan: Return home when improved.  Status is: Inpatient Remains inpatient appropriate because: Acute on chronic respiratory failure with hypoxia, acute diastolic CHF  Planned Discharge Destination: Home  with  Home Health           Medications: Scheduled:  Chlorhexidine Gluconate Cloth  6 each Topical Daily   furosemide  20 mg Intravenous BID   insulin aspart  0-15 Units Subcutaneous TID WC   lactulose  30 g Oral TID   levothyroxine  50 mcg Oral QAC breakfast   lipase/protease/amylase  48,000 Units Oral TID AC   magnesium oxide  400 mg Oral Daily   pantoprazole  40 mg Oral Daily   rifaximin  550 mg Oral BID   spironolactone  100 mg Oral Daily   Vitamin D (Ergocalciferol)  50,000 Units Oral Q Wed   Continuous:  phytonadione (VITAMIN K) IV 10 mg (04/22/21 0937)   IBB:[id number], oxyCODONE  Antibiotics: Anti-infectives (From admission, onward)    Start     Dose/Rate Route Frequency Ordered Stop   04/20/21 2200  rifaximin (XIFAXAN) tablet 550 mg        550 mg Oral 2 times daily 04/20/21 1757         Objective:  Vital Signs  Vitals:   04/21/21 2320 04/22/21 0450 04/22/21 0700 04/22/21 1119  BP: (!) 151/71 (!) 153/66 (!) 152/57 (!) 134/55  Pulse: 93 96 81 89  Resp: 18 20 17 16   Temp:  97.9 F (36.6 C) 98.6 F (37 C) 98 F (36.7 C)  TempSrc:  Axillary Oral Oral  SpO2: 90% 92% 94% 92%    Intake/Output Summary (Last 24 hours) at 04/22/2021 1159 Last data filed at 04/22/2021 1120 Gross per 24 hour  Intake 120 ml  Output 4000 ml  Net -3880 ml    There were no vitals filed for this visit.  General appearance: Awake alert.  In no distress but confused Resp: Clear to auscultation bilaterally.  Normal effort Cardio: S1-S2 is normal regular.  No S3-S4.  No rubs murmurs or bruit GI: Abdomen is nondistended.  Mildly tender diffusely without any rebound rigidity or guarding.  No masses organomegaly.  Bowel sounds present. Extremities: Mild edema bilateral lower extreme Neurologic: Oriented only to place.  No focal neurological deficits.     Lab Results:  Data Reviewed: I have personally reviewed labs and imaging study reports  CBC: Recent Labs  Lab  04/20/21 1857 04/21/21 0440 04/22/21 0214  WBC 6.7 4.9 3.6*  HGB 8.3* 7.7* 8.2*  HCT 25.8* 23.9* 24.3*  MCV 101.6* 100.8* 98.4  PLT 51* 47* 44*     Basic Metabolic Panel: Recent Labs  Lab 04/20/21 1857 04/21/21 0440 04/22/21 0214  NA 136 137 138  K 5.0 4.4 3.6  CL 100 100 99  CO2 28 27 29   GLUCOSE 219* 262* 231*  BUN 39* 39* 21*  CREATININE 1.21* 0.90 0.46  CALCIUM 9.2 9.2 9.1     GFR: Estimated Creatinine Clearance: 70.7 mL/min (by C-G formula based on SCr of 0.46 mg/dL).  Liver Function Tests: Recent Labs  Lab 04/20/21 1857 04/21/21 0440 04/22/21 0214  AST 36 34 40  ALT 17 16 19   ALKPHOS 63 60 66  BILITOT 20.0* 19.5* 19.6*  PROT 5.2* 4.9* 4.9*  ALBUMIN 2.9* 2.7* 2.6*     Coagulation Profile: Recent Labs  Lab 04/20/21 1857 04/21/21 0440 04/22/21 0214  INR 3.2* 3.2* 3.0*     CBG: Recent Labs  Lab 04/21/21 1348 04/21/21 1736 04/21/21 1943 04/22/21 0757 04/22/21 1102  GLUCAP 278* 378* 404* 167* 145*      Radiology Studies: CT CHEST WO CONTRAST  Result Date: 04/20/2021 CLINICAL DATA:  Pneumonia, complication suspected. EXAM: CT CHEST WITHOUT CONTRAST TECHNIQUE: Multidetector CT imaging of the chest was performed following the standard protocol without IV contrast. RADIATION DOSE REDUCTION: This exam was performed according to the departmental dose-optimization program which includes automated exposure control, adjustment of the mA and/or kV according to patient size and/or use of iterative reconstruction technique. COMPARISON:  04/11/2021. FINDINGS: Cardiovascular: The heart is enlarged and there is no pericardial effusion. Scattered coronary artery calcifications are noted. There is atherosclerotic calcification of the aorta without evidence of aneurysm. The pulmonary trunk is normal in caliber. Mediastinum/Nodes: Shotty lymph nodes are present in the mediastinum. No axillary lymphadenopathy. Evaluation of the hila is limited due to lack of IV  contrast. Surgical clips are present in the thyroid bed on the left. Hyperdense and hypodense nodules and calcifications are present in the right lobe of the thyroid gland. The trachea and esophagus are within normal limits. Lungs/Pleura: Patchy ground-glass opacities are present in the lungs bilaterally. There is consolidation in the lower lobes bilaterally. There are small to moderate bilateral pleural effusions. No pneumothorax is seen. Upper Abdomen: Cirrhotic changes are noted in the liver. A cystic lesion is present in the right lobe of the liver measuring 1.2 cm. The gallbladder is surgically absent. The spleen is enlarged. Mesenteric edema is noted and there is a small amount ascites in the right upper quadrant. Musculoskeletal: Anasarca is noted. A stable compression deformity is noted in the superior endplate of T7. No acute osseous abnormality. IMPRESSION: 1. Patchy ground-glass opacities in the lungs bilaterally with consolidation in the lower lobes, concerning for pneumonia versus edema. 2. Small to moderate bilateral pleural effusions. 3. Cardiomegaly with coronary artery calcifications. 4. Morphologic changes of cirrhosis and portal hypertension. 5. Small ascites in the right upper quadrant and anasarca. 6. Aortic atherosclerosis. Electronically Signed   By: Brett Fairy M.D.   On: 04/20/2021 23:29   ECHOCARDIOGRAM COMPLETE  Result Date: 04/21/2021    ECHOCARDIOGRAM REPORT   Patient Name:   [name] Date of Exam: 04/21/2021 Medical Rec #:  [id number]         Height:       63.0 in Accession #:    [id number]        Weight:       144.7 lb Date of Birth:  Apr 28, 1965         BSA:          1.685 m Patient Age:    81 years          BP:           121/55 mmHg Patient Gender: F                 HR:           78 bpm. Exam Location:  Inpatient Procedure: 2D Echo, Cardiac Doppler, Color Doppler and Strain Analysis Indications:    CHF  History:        Patient has prior history of Echocardiogram  examinations, most                 recent 07/25/2020. COPD, Signs/Symptoms:Chest Pain and Murmur;                 Risk Factors:Diabetes and Hypertension.  Sonographer:    [facility] Referring Phys: [id number] Lequita Halt IMPRESSIONS  1. Left ventricular ejection fraction, by estimation, is 60 to 65%. The left ventricle has normal function. The left ventricle has no regional wall motion abnormalities.  There is mild concentric left ventricular hypertrophy. Left ventricular diastolic parameters are indeterminate.  2. Right ventricular systolic function is normal. The right ventricular size is mildly enlarged. There is moderately elevated pulmonary artery systolic pressure.  3. Left atrial size was moderately dilated.  4. Right atrial size was mildly dilated.  5. The mitral valve is normal in structure. Moderate mitral valve regurgitation. No evidence of mitral stenosis.  6. Tricuspid valve regurgitation is mild to moderate.  7. The aortic valve is tricuspid. Aortic valve regurgitation is not visualized. Mild aortic valve stenosis. Aortic valve area, by VTI measures 1.80 cm. Aortic valve mean gradient measures 15.7 mmHg. Aortic valve Vmax measures 2.62 m/s.  8. The inferior vena cava is dilated in size with <50% respiratory variability, suggesting right atrial pressure of 15 mmHg. FINDINGS  Left Ventricle: Left ventricular ejection fraction, by estimation, is 60 to 65%. The left ventricle has normal function. The left ventricle has no regional wall motion abnormalities. Global longitudinal strain performed but not reported based on interpreter judgement due to suboptimal tracking. The left ventricular internal cavity size was normal in size. There is mild concentric left ventricular hypertrophy. Left ventricular diastolic function could not be evaluated due to mitral regurgitation (moderate or greater). Left ventricular diastolic parameters are indeterminate. Right Ventricle: The right ventricular size is mildly enlarged. No  increase in right ventricular wall thickness. Right ventricular systolic function is normal. There is moderately elevated pulmonary artery systolic pressure. The tricuspid regurgitant velocity is 3.02 m/s, and with an assumed right atrial pressure of 15 mmHg, the estimated right ventricular systolic pressure is 37.0 mmHg. Left Atrium: Left atrial size was moderately dilated. Right Atrium: Right atrial size was mildly dilated. Pericardium: There is no evidence of pericardial effusion. Mitral Valve: The mitral valve is normal in structure. Moderate mitral valve regurgitation. No evidence of mitral valve stenosis. Tricuspid Valve: The tricuspid valve is normal in structure. Tricuspid valve regurgitation is mild to moderate. No evidence of tricuspid stenosis. Aortic Valve: The aortic valve is tricuspid. Aortic valve regurgitation is not visualized. Mild aortic stenosis is present. Aortic valve mean gradient measures 15.7 mmHg. Aortic valve peak gradient measures 27.5 mmHg. Aortic valve area, by VTI measures 1.80 cm. Pulmonic Valve: The pulmonic valve was normal in structure. Pulmonic valve regurgitation is not visualized. No evidence of pulmonic stenosis. Aorta: The aortic root is normal in size and structure. Venous: The inferior vena cava is dilated in size with less than 50% respiratory variability, suggesting right atrial pressure of 15 mmHg. IAS/Shunts: No atrial level shunt detected by color flow Doppler.  LEFT VENTRICLE PLAX 2D LVIDd:         5.10 cm      Diastology LVIDs:         2.80 cm      LV e' medial:    6.42 cm/s LV PW:         1.10 cm      LV E/e' medial:  21.3 LV IVS:        1.00 cm      LV e' lateral:   9.79 cm/s LVOT diam:     2.00 cm      LV E/e' lateral: 14.0 LV SV:         100 LV SV Index:   59 LVOT Area:     3.14 cm  LV Volumes (MOD) LV vol d, MOD A2C: 109.0 ml LV vol d, MOD A4C: 123.0 ml LV vol s, MOD A2C: 29.3  ml LV vol s, MOD A4C: 50.2 ml LV SV MOD A2C:     79.7 ml LV SV MOD A4C:     123.0 ml  LV SV MOD BP:      82.2 ml RIGHT VENTRICLE RV Basal diam:  4.60 cm RV Mid diam:    3.50 cm RV S prime:     24.40 cm/s TAPSE (M-mode): 3.2 cm LEFT ATRIUM             Index        RIGHT ATRIUM           Index LA diam:        3.40 cm 2.02 cm/m   RA Area:     18.10 cm LA Vol (A2C):   59.9 ml 35.55 ml/m  RA Volume:   50.00 ml  29.67 ml/m LA Vol (A4C):   84.3 ml 50.03 ml/m LA Biplane Vol: 72.9 ml 43.26 ml/m  AORTIC VALVE                     PULMONIC VALVE AV Area (Vmax):    1.81 cm      PV Vmax:       1.51 m/s AV Area (Vmean):   1.78 cm      PV Vmean:      86.500 cm/s AV Area (VTI):     1.80 cm      PV VTI:        0.249 m AV Vmax:           262.33 cm/s   PV Peak grad:  9.1 mmHg AV Vmean:          184.000 cm/s  PV Mean grad:  4.0 mmHg AV VTI:            0.553 m AV Peak Grad:      27.5 mmHg AV Mean Grad:      15.7 mmHg LVOT Vmax:         151.00 cm/s LVOT Vmean:        104.300 cm/s LVOT VTI:          0.317 m LVOT/AV VTI ratio: 0.57  AORTA Ao Root diam: 2.50 cm Ao Asc diam:  2.90 cm MITRAL VALVE                 TRICUSPID VALVE MV Area (PHT): 3.06 cm      TR Peak grad:   36.5 mmHg MV Decel Time: 248 msec      TR Vmax:        302.00 cm/s MR Peak grad:   117.9 mmHg MR Mean grad:   80.0 mmHg    SHUNTS MR Vmax:        543.00 cm/s  Systemic VTI:  0.32 m MR Vmean:       421.0 cm/s   Systemic Diam: 2.00 cm MR PISA:        1.01 cm MR PISA Radius: 0.40 cm MV E velocity: 137.00 cm/s MV A velocity: 69.20 cm/s MV E/A ratio:  1.98 Kardie Tobb DO Electronically signed by Berniece Salines DO Signature Date/Time: 04/21/2021/5:27:36 PM    Final        LOS: 2 days   Bonnielee Haff  [facility] Pager on [email or web address]  04/22/2021, 11:59 AM

## 2021-04-22 NOTE — Progress Notes (Signed)
Progress Note   Subjective  Chief Complaint: Cirrhosis, acute on chronic anemia  Patient lying in bed, complaining of AB pain but states she always has it.  States breathing improving some.  No N, V.     Objective   Vital signs in last 24 hours: Temp:  [97.7 F (36.5 C)-98.8 F (37.1 C)] 98 F (36.7 C) (02/02 1119) Pulse Rate:  [81-96] 89 (02/02 1119) Resp:  [16-21] 16 (02/02 1119) BP: (132-156)/(50-71) 134/55 (02/02 1119) SpO2:  [90 %-94 %] 92 % (02/02 1119) Last BM Date: 04/21/21  General: Patient is chronically ill.   Lungs: Crackles in the bases.  Heart: RRR. Abdomen: Soft, nondistended.  No fluid wave or obvious ascites.  Tenderness is mild to moderate diffusely without guarding or rebound.  Bowel sounds normal, active..   Musc/Skeltl: No joint redness, swelling or significant deformity. Extremities: Pedal edema bilaterally Neurologic: Pleasant, calm.  No tremors, no asterixis.  Moves all 4 limbs, formal strength testing not performed    Intake/Output from previous day: 02/01 0701 - 02/02 0700 In: 240 [P.O.:240] Out: 3650 [Urine:3650] Intake/Output this shift: Total I/O In: -  Out: 1100 [Urine:1100]  Lab Results: Recent Labs    04/20/21 1857 04/21/21 0440 04/22/21 0214  WBC 6.7 4.9 3.6*  HGB 8.3* 7.7* 8.2*  HCT 25.8* 23.9* 24.3*  PLT 51* 47* 44*   BMET Recent Labs    04/20/21 1857 04/21/21 0440 04/22/21 0214  NA 136 137 138  K 5.0 4.4 3.6  CL 100 100 99  CO2 28 27 29   GLUCOSE 219* 262* 231*  BUN 39* 39* 21*  CREATININE 1.21* 0.90 0.46  CALCIUM 9.2 9.2 9.1   LFT Recent Labs    04/22/21 0214  PROT 4.9*  ALBUMIN 2.6*  AST 40  ALT 19  ALKPHOS 66  BILITOT 19.6*   PT/INR Recent Labs    04/21/21 0440 04/22/21 0214  LABPROT 32.5* 31.0*  INR 3.2* 3.0*    Studies/Results: CT CHEST WO CONTRAST  Result Date: 04/20/2021 CLINICAL DATA:  Pneumonia, complication suspected. EXAM: CT CHEST WITHOUT CONTRAST TECHNIQUE: Multidetector CT  imaging of the chest was performed following the standard protocol without IV contrast. RADIATION DOSE REDUCTION: This exam was performed according to the departmental dose-optimization program which includes automated exposure control, adjustment of the mA and/or kV according to patient size and/or use of iterative reconstruction technique. COMPARISON:  04/11/2021. FINDINGS: Cardiovascular: The heart is enlarged and there is no pericardial effusion. Scattered coronary artery calcifications are noted. There is atherosclerotic calcification of the aorta without evidence of aneurysm. The pulmonary trunk is normal in caliber. Mediastinum/Nodes: Shotty lymph nodes are present in the mediastinum. No axillary lymphadenopathy. Evaluation of the hila is limited due to lack of IV contrast. Surgical clips are present in the thyroid bed on the left. Hyperdense and hypodense nodules and calcifications are present in the right lobe of the thyroid gland. The trachea and esophagus are within normal limits. Lungs/Pleura: Patchy ground-glass opacities are present in the lungs bilaterally. There is consolidation in the lower lobes bilaterally. There are small to moderate bilateral pleural effusions. No pneumothorax is seen. Upper Abdomen: Cirrhotic changes are noted in the liver. A cystic lesion is present in the right lobe of the liver measuring 1.2 cm. The gallbladder is surgically absent. The spleen is enlarged. Mesenteric edema is noted and there is a small amount ascites in the right upper quadrant. Musculoskeletal: Anasarca is noted. A stable compression deformity is noted in the  superior endplate of T7. No acute osseous abnormality. IMPRESSION: 1. Patchy ground-glass opacities in the lungs bilaterally with consolidation in the lower lobes, concerning for pneumonia versus edema. 2. Small to moderate bilateral pleural effusions. 3. Cardiomegaly with coronary artery calcifications. 4. Morphologic changes of cirrhosis and portal  hypertension. 5. Small ascites in the right upper quadrant and anasarca. 6. Aortic atherosclerosis. Electronically Signed   By: Brett Fairy M.D.   On: 04/20/2021 23:29   ECHOCARDIOGRAM COMPLETE  Result Date: 04/21/2021    ECHOCARDIOGRAM REPORT   Patient Name:   [name] Date of Exam: 04/21/2021 Medical Rec #:  [id number]         Height:       63.0 in Accession #:    [id number]        Weight:       144.7 lb Date of Birth:  01/17/1966         BSA:          1.685 m Patient Age:    56 years          BP:           121/55 mmHg Patient Gender: F                 HR:           78 bpm. Exam Location:  Inpatient Procedure: 2D Echo, Cardiac Doppler, Color Doppler and Strain Analysis Indications:    CHF  History:        Patient has prior history of Echocardiogram examinations, most                 recent 07/25/2020. COPD, Signs/Symptoms:Chest Pain and Murmur;                 Risk Factors:Diabetes and Hypertension.  Sonographer:    [facility] Referring Phys: [id number] Lequita Halt IMPRESSIONS  1. Left ventricular ejection fraction, by estimation, is 60 to 65%. The left ventricle has normal function. The left ventricle has no regional wall motion abnormalities. There is mild concentric left ventricular hypertrophy. Left ventricular diastolic parameters are indeterminate.  2. Right ventricular systolic function is normal. The right ventricular size is mildly enlarged. There is moderately elevated pulmonary artery systolic pressure.  3. Left atrial size was moderately dilated.  4. Right atrial size was mildly dilated.  5. The mitral valve is normal in structure. Moderate mitral valve regurgitation. No evidence of mitral stenosis.  6. Tricuspid valve regurgitation is mild to moderate.  7. The aortic valve is tricuspid. Aortic valve regurgitation is not visualized. Mild aortic valve stenosis. Aortic valve area, by VTI measures 1.80 cm. Aortic valve mean gradient measures 15.7 mmHg. Aortic valve Vmax measures 2.62 m/s.  8. The  inferior vena cava is dilated in size with <50% respiratory variability, suggesting right atrial pressure of 15 mmHg. FINDINGS  Left Ventricle: Left ventricular ejection fraction, by estimation, is 60 to 65%. The left ventricle has normal function. The left ventricle has no regional wall motion abnormalities. Global longitudinal strain performed but not reported based on interpreter judgement due to suboptimal tracking. The left ventricular internal cavity size was normal in size. There is mild concentric left ventricular hypertrophy. Left ventricular diastolic function could not be evaluated due to mitral regurgitation (moderate or greater). Left ventricular diastolic parameters are indeterminate. Right Ventricle: The right ventricular size is mildly enlarged. No increase in right ventricular wall thickness. Right ventricular systolic function is normal. There is moderately elevated pulmonary  artery systolic pressure. The tricuspid regurgitant velocity is 3.02 m/s, and with an assumed right atrial pressure of 15 mmHg, the estimated right ventricular systolic pressure is 93.2 mmHg. Left Atrium: Left atrial size was moderately dilated. Right Atrium: Right atrial size was mildly dilated. Pericardium: There is no evidence of pericardial effusion. Mitral Valve: The mitral valve is normal in structure. Moderate mitral valve regurgitation. No evidence of mitral valve stenosis. Tricuspid Valve: The tricuspid valve is normal in structure. Tricuspid valve regurgitation is mild to moderate. No evidence of tricuspid stenosis. Aortic Valve: The aortic valve is tricuspid. Aortic valve regurgitation is not visualized. Mild aortic stenosis is present. Aortic valve mean gradient measures 15.7 mmHg. Aortic valve peak gradient measures 27.5 mmHg. Aortic valve area, by VTI measures 1.80 cm. Pulmonic Valve: The pulmonic valve was normal in structure. Pulmonic valve regurgitation is not visualized. No evidence of pulmonic stenosis.  Aorta: The aortic root is normal in size and structure. Venous: The inferior vena cava is dilated in size with less than 50% respiratory variability, suggesting right atrial pressure of 15 mmHg. IAS/Shunts: No atrial level shunt detected by color flow Doppler.  LEFT VENTRICLE PLAX 2D LVIDd:         5.10 cm      Diastology LVIDs:         2.80 cm      LV e' medial:    6.42 cm/s LV PW:         1.10 cm      LV E/e' medial:  21.3 LV IVS:        1.00 cm      LV e' lateral:   9.79 cm/s LVOT diam:     2.00 cm      LV E/e' lateral: 14.0 LV SV:         100 LV SV Index:   59 LVOT Area:     3.14 cm  LV Volumes (MOD) LV vol d, MOD A2C: 109.0 ml LV vol d, MOD A4C: 123.0 ml LV vol s, MOD A2C: 29.3 ml LV vol s, MOD A4C: 50.2 ml LV SV MOD A2C:     79.7 ml LV SV MOD A4C:     123.0 ml LV SV MOD BP:      82.2 ml RIGHT VENTRICLE RV Basal diam:  4.60 cm RV Mid diam:    3.50 cm RV S prime:     24.40 cm/s TAPSE (M-mode): 3.2 cm LEFT ATRIUM             Index        RIGHT ATRIUM           Index LA diam:        3.40 cm 2.02 cm/m   RA Area:     18.10 cm LA Vol (A2C):   59.9 ml 35.55 ml/m  RA Volume:   50.00 ml  29.67 ml/m LA Vol (A4C):   84.3 ml 50.03 ml/m LA Biplane Vol: 72.9 ml 43.26 ml/m  AORTIC VALVE                     PULMONIC VALVE AV Area (Vmax):    1.81 cm      PV Vmax:       1.51 m/s AV Area (Vmean):   1.78 cm      PV Vmean:      86.500 cm/s AV Area (VTI):     1.80 cm      PV VTI:  0.249 m AV Vmax:           262.33 cm/s   PV Peak grad:  9.1 mmHg AV Vmean:          184.000 cm/s  PV Mean grad:  4.0 mmHg AV VTI:            0.553 m AV Peak Grad:      27.5 mmHg AV Mean Grad:      15.7 mmHg LVOT Vmax:         151.00 cm/s LVOT Vmean:        104.300 cm/s LVOT VTI:          0.317 m LVOT/AV VTI ratio: 0.57  AORTA Ao Root diam: 2.50 cm Ao Asc diam:  2.90 cm MITRAL VALVE                 TRICUSPID VALVE MV Area (PHT): 3.06 cm      TR Peak grad:   36.5 mmHg MV Decel Time: 248 msec      TR Vmax:        302.00 cm/s MR Peak grad:    117.9 mmHg MR Mean grad:   80.0 mmHg    SHUNTS MR Vmax:        543.00 cm/s  Systemic VTI:  0.32 m MR Vmean:       421.0 cm/s   Systemic Diam: 2.00 cm MR PISA:        1.01 cm MR PISA Radius: 0.40 cm MV E velocity: 137.00 cm/s MV A velocity: 69.20 cm/s MV E/A ratio:  1.98 Kardie Tobb DO Electronically signed by Berniece Salines DO Signature Date/Time: 04/21/2021/5:27:36 PM    Final       Impression/Plan:   Acute on chronic anemia  HGB 8.2 (7.7) MCV 98.4 Platelets 44 04/13/2021 Iron 113 Ferritin 234 B12 931 No overt bleeding, globin stable Slight elevation in BUN, have improved Patient has high risk for endoscopic procedures, per cardiology note patient appears hypervolemic, getting diuresed, hold off on endoscopy at this time until cardiac status status is improved. 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   Hypercoagulability INR 3.0, continue to give vitamin K due to elevated INR too elevated for procedures at this time.   Hx HE.   No signs of acute encephalopathy.  On stable doses Xifaxan, lactulose   Elevated troponin I at 172.   Echo 60 to 65% ventricular hypertrophy, no regional wall motion abnormalities moderately elevated pulmonary artery systolic pressure mild aortic valve stenosis, moderate mitral valve regurgitation  chest CT shows pneumonia vs edema in lower lobes as well as bilateral groundglass opacities and moderate pleural effusions. Getting  diuresis now, cardiology suggest holding off on endoscopy until patient more euvolemic and cardiac status optimized.   Chronic nausea  Longstanding.  Not on PPI.  Stable 4 times daily doses oxycodone, History of gastroparesis   Type II,  IDDM.  Chronic hypoxia  On 5 L [place]     No future appointments.    LOS: 2 days   Vladimir Crofts  04/22/2021, 1:01 PM

## 2021-04-22 NOTE — Consult Note (Signed)
Cardiology Consultation:   Patient ID: [name] MRN: [id number]; DOB: [birth year]  Admit date: 04/20/2021 Date of Consult: 04/22/2021  PCP:  [facility]   [facility] Providers Cardiologist:  None        Patient Profile:   [name] is a 56 y.o. female with a hx of cirrhosis, hypertension, IDDM, anemia, thrombocytopenia, chronic hypoxic respiratory failure who is being seen 04/22/2021 for preop evaluation at the request of Dr Rito Ehrlich.  History of Present Illness:   [name] with a history of cirrhosis, hypertension, IDDM, anemia, thrombocytopenia, chronic hypoxic respiratory failure who was admitted on 1/31 with shortness of breath.  She had recent hospitalization for ESBL UTI and completed course of meropenem.  She was found to be anemic during the hospitalization and received a total of 5 units PRBCs.  She reported worsening shortness of breath and presented to the ED in [place].  Initial ABGs showed 7.2 4/70/95.  She was started on BiPAP.  Initial hemoglobin 9.3, platelets 56, creatinine 0.7.  Chest x-ray showed small bilateral pleural effusions.  She was started on IV Lasix, lungs IV Solu-Medrol.  She was transferred to [facility] on 1/31.  She was diuresed with furosemide 20 mg twice daily.  O2 weaned to baseline 4 L.  GI was consulted given her recent anemia and planning EGD but requested cardiac evaluation prior to this.  Echocardiogram 2/1 showed EF 60 to 65%, indeterminant diastolic function, normal RV function, moderate pulmonary hypertension (RVSP 52), mild aortic stenosis, moderate MR, RAP 15.  She was net -3.4 L yesterday, -5.8 L on admission.  BP 134/55 today.  She is on IV Lasix 20 mg twice daily, spironolactone 100 mg daily.  Labs today notable for creatinine 0.5, total bilirubin 19.6, AST 40 ALT 19, WBC 3.6, hemoglobin 11.2, platelets 44, INR 3.0, albumin 2.6.  CT chest showed patchy groundglass opacities in the lungs bilaterally with consolidation  in the lower lobes concerning for pneumonia versus edema, small to moderate bilateral pleural effusions.  EKG showed normal sinus rhythm, rate 75, no ST abnormalities.  She reports that she is not very active.  Most exertion she does is walking around her home.  Exertion is limited by leg pain.  She denies any issues with chest pain.  Currently reports feeling short of breath, but improved since admission.   Past Medical History:  Diagnosis Date   Degenerative joint disease    Diabetes mellitus without complication (HCC)    Gastroesophageal reflux    Pancreatitis    Panic attack    Sleep apnea    Thrombocytopenia (HCC)     Past Surgical History:  Procedure Laterality Date   CHOLECYSTECTOMY     SHOULDER SURGERY     left    tongue sx. titanium screw placed to hold tongue down     TOTAL THYROIDECTOMY         Inpatient Medications: Scheduled Meds:  sodium chloride   Intravenous Once   Chlorhexidine Gluconate Cloth  6 each Topical Daily   furosemide  20 mg Intravenous BID   insulin aspart  0-15 Units Subcutaneous TID WC   lactulose  30 g Oral TID   levothyroxine  50 mcg Oral QAC breakfast   lipase/protease/amylase  48,000 Units Oral TID AC   magnesium oxide  400 mg Oral Daily   pantoprazole  40 mg Oral Daily   rifaximin  550 mg Oral BID   spironolactone  100 mg Oral Daily   Vitamin D (  Ergocalciferol)  50,000 Units Oral Q Wed   Continuous Infusions:  phytonadione (VITAMIN K) IV 10 mg (04/22/21 0937)   PRN Meds: albuterol, oxyCODONE  Allergies:    Allergies  Allergen Reactions   Acetaminophen Hives and Other (See Comments)    Tolerates Fioricet   Cephalexin Itching, Swelling and Other (See Comments)    Tongue swells and makes throat itchy- can tolerate Zosyn   Ciprofloxacin Hives   Hydromorphone Hives   Sulfamethoxazole-Trimethoprim Shortness Of Breath, Swelling and Other (See Comments)    Tongue swells    Tizanidine Anaphylaxis   Abilify [Aripiprazole]     "I  did noyt like the way this made me feel."    Social History:   Social History   Socioeconomic History   Marital status: Single    Spouse name: Not on file   Number of children: Not on file   Years of education: Not on file   Highest education level: Not on file  Occupational History   Not on file  Tobacco Use   Smoking status: Never   Smokeless tobacco: Never  Vaping Use   Vaping Use: Never used  Substance and Sexual Activity   Alcohol use: No   Drug use: No   Sexual activity: Not Currently  Other Topics Concern   Not on file  Social History Narrative   Not on file   Social Determinants of Health   Financial Resource Strain: Not on file  Food Insecurity: Not on file  Transportation Needs: Not on file  Physical Activity: Not on file  Stress: Not on file  Social Connections: Not on file  Intimate Partner Violence: Not on file    Family History:   No family history on file.   ROS:  Please see the history of present illness.   All other ROS reviewed and negative.     Physical Exam/Data:   Vitals:   04/21/21 2320 04/22/21 0450 04/22/21 0700 04/22/21 1119  BP: (!) 151/71 (!) 153/66 (!) 152/57 (!) 134/55  Pulse: 93 96 81 89  Resp: 18 20 17 16   Temp:  97.9 F (36.6 C) 98.6 F (37 C) 98 F (36.7 C)  TempSrc:  Axillary Oral Oral  SpO2: 90% 92% 94% 92%    Intake/Output Summary (Last 24 hours) at 04/22/2021 1347 Last data filed at 04/22/2021 1120 Gross per 24 hour  Intake 120 ml  Output 4000 ml  Net -3880 ml   Last 3 Weights 04/15/2021 04/14/2021 04/13/2021  Weight (lbs) 144 lb 11.2 oz 149 lb 156 lb 12 oz  Weight (kg) 65.635 kg 67.586 kg 71.1 kg  Some encounter information is confidential and restricted. Go to Review Flowsheets activity to see all data.     There is no height or weight on file to calculate BMI.  General: Chronically ill-appearing, in no acute distress HEENT: normal Neck: + JVD Cardiac:  RRR; 2 out of 6 systolic murmur Lungs: Bibasilar  crackles Abd: soft Ext: no edema Musculoskeletal:  No deformities, Skin: warm and dry  Neuro:  no focal abnormalities noted Psych: Flat affect   EKG:  The EKG was personally reviewed and demonstrates:  normal sinus rhythm, rate 75, no ST abnormalities Telemetry:  Telemetry was personally reviewed and demonstrates: Normal sinus rhythm with rates 80s to 90s  Relevant CV Studies:   Laboratory Data:  High Sensitivity Troponin:   Recent Labs  Lab 04/20/21 1857 04/20/21 2142  TROPONINIHS 202* 172*     Chemistry Recent Labs  Lab  04/20/21 1857 04/21/21 0440 04/22/21 0214  NA 136 137 138  K 5.0 4.4 3.6  CL 100 100 99  CO2 28 27 29   GLUCOSE 219* 262* 231*  BUN 39* 39* 21*  CREATININE 1.21* 0.90 0.46  CALCIUM 9.2 9.2 9.1  GFRNONAA 52* >60 >60  ANIONGAP 8 10 10     Recent Labs  Lab 04/20/21 1857 04/21/21 0440 04/22/21 0214  PROT 5.2* 4.9* 4.9*  ALBUMIN 2.9* 2.7* 2.6*  AST 36 34 40  ALT 17 16 19   ALKPHOS 63 60 66  BILITOT 20.0* 19.5* 19.6*   Lipids No results for input(s): CHOL, TRIG, HDL, LABVLDL, LDLCALC, CHOLHDL in the last 168 hours.  Hematology Recent Labs  Lab 04/20/21 1857 04/21/21 0440 04/22/21 0214  WBC 6.7 4.9 3.6*  RBC 2.54* 2.37* 2.47*  HGB 8.3* 7.7* 8.2*  HCT 25.8* 23.9* 24.3*  MCV 101.6* 100.8* 98.4  MCH 32.7 32.5 33.2  MCHC 32.2 32.2 33.7  RDW 26.1* 26.2* 25.3*  PLT 51* 47* 44*   Thyroid No results for input(s): TSH, FREET4 in the last 168 hours.  BNPNo results for input(s): BNP, PROBNP in the last 168 hours.  DDimer No results for input(s): DDIMER in the last 168 hours.   Radiology/Studies:  CT CHEST WO CONTRAST  Result Date: 04/20/2021 CLINICAL DATA:  Pneumonia, complication suspected. EXAM: CT CHEST WITHOUT CONTRAST TECHNIQUE: Multidetector CT imaging of the chest was performed following the standard protocol without IV contrast. RADIATION DOSE REDUCTION: This exam was performed according to the departmental dose-optimization program  which includes automated exposure control, adjustment of the mA and/or kV according to patient size and/or use of iterative reconstruction technique. COMPARISON:  04/11/2021. FINDINGS: Cardiovascular: The heart is enlarged and there is no pericardial effusion. Scattered coronary artery calcifications are noted. There is atherosclerotic calcification of the aorta without evidence of aneurysm. The pulmonary trunk is normal in caliber. Mediastinum/Nodes: Shotty lymph nodes are present in the mediastinum. No axillary lymphadenopathy. Evaluation of the hila is limited due to lack of IV contrast. Surgical clips are present in the thyroid bed on the left. Hyperdense and hypodense nodules and calcifications are present in the right lobe of the thyroid gland. The trachea and esophagus are within normal limits. Lungs/Pleura: Patchy ground-glass opacities are present in the lungs bilaterally. There is consolidation in the lower lobes bilaterally. There are small to moderate bilateral pleural effusions. No pneumothorax is seen. Upper Abdomen: Cirrhotic changes are noted in the liver. A cystic lesion is present in the right lobe of the liver measuring 1.2 cm. The gallbladder is surgically absent. The spleen is enlarged. Mesenteric edema is noted and there is a small amount ascites in the right upper quadrant. Musculoskeletal: Anasarca is noted. A stable compression deformity is noted in the superior endplate of T7. No acute osseous abnormality. IMPRESSION: 1. Patchy ground-glass opacities in the lungs bilaterally with consolidation in the lower lobes, concerning for pneumonia versus edema. 2. Small to moderate bilateral pleural effusions. 3. Cardiomegaly with coronary artery calcifications. 4. Morphologic changes of cirrhosis and portal hypertension. 5. Small ascites in the right upper quadrant and anasarca. 6. Aortic atherosclerosis. Electronically Signed   By: 06/20/21 M.D.   On: 04/20/2021 23:29   ECHOCARDIOGRAM  COMPLETE  Result Date: 04/21/2021    ECHOCARDIOGRAM REPORT   Patient Name:   [name] Date of Exam: 04/21/2021 Medical Rec #:  06/19/2021         Height:       63.0 in  Accession #:    [id number]        Weight:       144.7 lb Date of Birth:  [birth year]         BSA:          1.685 m Patient Age:    55 years          BP:           121/55 mmHg Patient Gender: F                 HR:           78 bpm. Exam Location:  Inpatient Procedure: 2D Echo, Cardiac Doppler, Color Doppler and Strain Analysis Indications:    CHF  History:        Patient has prior history of Echocardiogram examinations, most                 recent 07/25/2020. COPD, Signs/Symptoms:Chest Pain and Murmur;                 Risk Factors:Diabetes and Hypertension.  Sonographer:    [facility] Referring Phys: [id number] Emeline GeneralPING T ZHANG IMPRESSIONS  1. Left ventricular ejection fraction, by estimation, is 60 to 65%. The left ventricle has normal function. The left ventricle has no regional wall motion abnormalities. There is mild concentric left ventricular hypertrophy. Left ventricular diastolic parameters are indeterminate.  2. Right ventricular systolic function is normal. The right ventricular size is mildly enlarged. There is moderately elevated pulmonary artery systolic pressure.  3. Left atrial size was moderately dilated.  4. Right atrial size was mildly dilated.  5. The mitral valve is normal in structure. Moderate mitral valve regurgitation. No evidence of mitral stenosis.  6. Tricuspid valve regurgitation is mild to moderate.  7. The aortic valve is tricuspid. Aortic valve regurgitation is not visualized. Mild aortic valve stenosis. Aortic valve area, by VTI measures 1.80 cm. Aortic valve mean gradient measures 15.7 mmHg. Aortic valve Vmax measures 2.62 m/s.  8. The inferior vena cava is dilated in size with <50% respiratory variability, suggesting right atrial pressure of 15 mmHg. FINDINGS  Left Ventricle: Left ventricular ejection fraction, by estimation,  is 60 to 65%. The left ventricle has normal function. The left ventricle has no regional wall motion abnormalities. Global longitudinal strain performed but not reported based on interpreter judgement due to suboptimal tracking. The left ventricular internal cavity size was normal in size. There is mild concentric left ventricular hypertrophy. Left ventricular diastolic function could not be evaluated due to mitral regurgitation (moderate or greater). Left ventricular diastolic parameters are indeterminate. Right Ventricle: The right ventricular size is mildly enlarged. No increase in right ventricular wall thickness. Right ventricular systolic function is normal. There is moderately elevated pulmonary artery systolic pressure. The tricuspid regurgitant velocity is 3.02 m/s, and with an assumed right atrial pressure of 15 mmHg, the estimated right ventricular systolic pressure is 51.5 mmHg. Left Atrium: Left atrial size was moderately dilated. Right Atrium: Right atrial size was mildly dilated. Pericardium: There is no evidence of pericardial effusion. Mitral Valve: The mitral valve is normal in structure. Moderate mitral valve regurgitation. No evidence of mitral valve stenosis. Tricuspid Valve: The tricuspid valve is normal in structure. Tricuspid valve regurgitation is mild to moderate. No evidence of tricuspid stenosis. Aortic Valve: The aortic valve is tricuspid. Aortic valve regurgitation is not visualized. Mild aortic stenosis is present. Aortic valve mean gradient measures 15.7 mmHg. Aortic valve peak gradient measures 27.5 mmHg.  Aortic valve area, by VTI measures 1.80 cm. Pulmonic Valve: The pulmonic valve was normal in structure. Pulmonic valve regurgitation is not visualized. No evidence of pulmonic stenosis. Aorta: The aortic root is normal in size and structure. Venous: The inferior vena cava is dilated in size with less than 50% respiratory variability, suggesting right atrial pressure of 15 mmHg.  IAS/Shunts: No atrial level shunt detected by color flow Doppler.  LEFT VENTRICLE PLAX 2D LVIDd:         5.10 cm      Diastology LVIDs:         2.80 cm      LV e' medial:    6.42 cm/s LV PW:         1.10 cm      LV E/e' medial:  21.3 LV IVS:        1.00 cm      LV e' lateral:   9.79 cm/s LVOT diam:     2.00 cm      LV E/e' lateral: 14.0 LV SV:         100 LV SV Index:   59 LVOT Area:     3.14 cm  LV Volumes (MOD) LV vol d, MOD A2C: 109.0 ml LV vol d, MOD A4C: 123.0 ml LV vol s, MOD A2C: 29.3 ml LV vol s, MOD A4C: 50.2 ml LV SV MOD A2C:     79.7 ml LV SV MOD A4C:     123.0 ml LV SV MOD BP:      82.2 ml RIGHT VENTRICLE RV Basal diam:  4.60 cm RV Mid diam:    3.50 cm RV S prime:     24.40 cm/s TAPSE (M-mode): 3.2 cm LEFT ATRIUM             Index        RIGHT ATRIUM           Index LA diam:        3.40 cm 2.02 cm/m   RA Area:     18.10 cm LA Vol (A2C):   59.9 ml 35.55 ml/m  RA Volume:   50.00 ml  29.67 ml/m LA Vol (A4C):   84.3 ml 50.03 ml/m LA Biplane Vol: 72.9 ml 43.26 ml/m  AORTIC VALVE                     PULMONIC VALVE AV Area (Vmax):    1.81 cm      PV Vmax:       1.51 m/s AV Area (Vmean):   1.78 cm      PV Vmean:      86.500 cm/s AV Area (VTI):     1.80 cm      PV VTI:        0.249 m AV Vmax:           262.33 cm/s   PV Peak grad:  9.1 mmHg AV Vmean:          184.000 cm/s  PV Mean grad:  4.0 mmHg AV VTI:            0.553 m AV Peak Grad:      27.5 mmHg AV Mean Grad:      15.7 mmHg LVOT Vmax:         151.00 cm/s LVOT Vmean:        104.300 cm/s LVOT VTI:          0.317 m LVOT/AV VTI ratio: 0.57  AORTA Ao Root diam: 2.50 cm Ao Asc diam:  2.90 cm MITRAL VALVE                 TRICUSPID VALVE MV Area (PHT): 3.06 cm      TR Peak grad:   36.5 mmHg MV Decel Time: 248 msec      TR Vmax:        302.00 cm/s MR Peak grad:   117.9 mmHg MR Mean grad:   80.0 mmHg    SHUNTS MR Vmax:        543.00 cm/s  Systemic VTI:  0.32 m MR Vmean:       421.0 cm/s   Systemic Diam: 2.00 cm MR PISA:        1.01 cm MR PISA Radius: 0.40  cm MV E velocity: 137.00 cm/s MV A velocity: 69.20 cm/s MV E/A ratio:  1.98 Kardie Tobb DO Electronically signed by Thomasene Ripple DO Signature Date/Time: 04/21/2021/5:27:36 PM    Final      Assessment and Plan:   Preop evaluation: Prior to EGD.  She is currently short of breath and appears volume overloaded.  Denies any chest pain.  Limited functional activity, less than 4 METS.  Given low risk procedure, no further cardiac work-up recommended prior to procedure.  However, as she still appears volume overloaded and is requiring more than her baseline O2, and considering does not appear to have active bleeding to indicate more urgent need for EGD, would recommend continued diuresis and plan for EGD once volume status optimized  Acute on chronic diastolic heart failure: Echocardiogram 2/1 showed EF 60 to 65%, indeterminant diastolic function, normal RV function, moderate pulmonary hypertension (RVSP 52), mild aortic stenosis, moderate MR, RAP 15.  Appears hypervolemic on exam with bibasilar crackles and elevated JVD. -Good diuresis yesterday, -3.4 L on IV Lasix 20 mg twice daily and spironolactone 100 mg daily.  Would continue IV diuresis  Acute on chronic hypoxic respiratory failure: Currently on 5 L [place].  Continue diuresis as above.  Elevated troponin: 202 >172.  Likely demand ischemia in setting of acute on chronic diastolic heart failure and anemia.  Anemia: GI planning EGD as above  Cirrhosis: Has been evaluated for liver transplant at [facility], not felt to be candidate.  On lactulose, rifaximin   For questions or updates, please contact [facility] Please consult [email or web address] for contact info under    Signed, Little Ishikawa, MD  04/22/2021 1:47 PM

## 2021-04-22 NOTE — Progress Notes (Signed)
Pt refuses Bi-PAP for the night. Rt will continue to monitor as needed.

## 2021-04-23 DIAGNOSIS — I5033 Acute on chronic diastolic (congestive) heart failure: Secondary | ICD-10-CM | POA: Diagnosis not present

## 2021-04-23 DIAGNOSIS — D689 Coagulation defect, unspecified: Secondary | ICD-10-CM | POA: Diagnosis not present

## 2021-04-23 DIAGNOSIS — Z01810 Encounter for preprocedural cardiovascular examination: Secondary | ICD-10-CM | POA: Diagnosis not present

## 2021-04-23 DIAGNOSIS — J9621 Acute and chronic respiratory failure with hypoxia: Secondary | ICD-10-CM | POA: Diagnosis not present

## 2021-04-23 DIAGNOSIS — R109 Unspecified abdominal pain: Secondary | ICD-10-CM | POA: Diagnosis not present

## 2021-04-23 DIAGNOSIS — D539 Nutritional anemia, unspecified: Secondary | ICD-10-CM | POA: Diagnosis not present

## 2021-04-23 DIAGNOSIS — K7210 Chronic hepatic failure without coma: Secondary | ICD-10-CM | POA: Diagnosis not present

## 2021-04-23 LAB — COMPREHENSIVE METABOLIC PANEL
ALT: 19 U/L (ref 0–44)
AST: 41 U/L (ref 15–41)
Albumin: 2.5 g/dL — ABNORMAL LOW (ref 3.5–5.0)
Alkaline Phosphatase: 59 U/L (ref 38–126)
Anion gap: 6 (ref 5–15)
BUN: 15 mg/dL (ref 6–20)
CO2: 34 mmol/L — ABNORMAL HIGH (ref 22–32)
Calcium: 8.7 mg/dL — ABNORMAL LOW (ref 8.9–10.3)
Chloride: 99 mmol/L (ref 98–111)
Creatinine, Ser: 0.43 mg/dL — ABNORMAL LOW (ref 0.44–1.00)
GFR, Estimated: >60 mL/min (ref >60)
Glucose, Bld: 224 mg/dL — ABNORMAL HIGH (ref 70–99)
Potassium: 3.6 mmol/L (ref 3.5–5.1)
Sodium: 139 mmol/L (ref 135–145)
Total Bilirubin: 19.2 mg/dL (ref 0.3–1.2)
Total Protein: 4.8 g/dL — ABNORMAL LOW (ref 6.5–8.1)

## 2021-04-23 LAB — URINALYSIS, COMPLETE (UACMP) WITH MICROSCOPIC
Glucose, UA: >=500 mg/dL — AB
Ketones, ur: NEGATIVE mg/dL
Leukocytes,Ua: NEGATIVE
Nitrite: NEGATIVE
Protein, ur: 30 mg/dL — AB
RBC / HPF: >50 RBC/hpf (ref 0–5)
Specific Gravity, Urine: 1.015 (ref 1.005–1.030)
pH: 7.5 (ref 5.0–8.0)

## 2021-04-23 LAB — CBC
HCT: 22.8 % — ABNORMAL LOW (ref 36.0–46.0)
Hemoglobin: 7.5 g/dL — ABNORMAL LOW (ref 12.0–15.0)
MCH: 32.9 pg (ref 26.0–34.0)
MCHC: 32.9 g/dL (ref 30.0–36.0)
MCV: 100 fL (ref 80.0–100.0)
Platelets: 40 10*3/uL — ABNORMAL LOW (ref 150–400)
RBC: 2.28 MIL/uL — ABNORMAL LOW (ref 3.87–5.11)
RDW: 25.4 % — ABNORMAL HIGH (ref 11.5–15.5)
WBC: 2.9 10*3/uL — ABNORMAL LOW (ref 4.0–10.5)
nRBC: 1 % — ABNORMAL HIGH (ref 0.0–0.2)

## 2021-04-23 LAB — PREPARE CRYOPRECIPITATE: Unit division: 0

## 2021-04-23 LAB — BPAM CRYOPRECIPITATE
Blood Product Expiration Date: 202302022243
ISSUE DATE / TIME: 202302021726
Unit Type and Rh: 5100

## 2021-04-23 LAB — GLUCOSE, CAPILLARY
Glucose-Capillary: 168 mg/dL — ABNORMAL HIGH (ref 70–99)
Glucose-Capillary: 317 mg/dL — ABNORMAL HIGH (ref 70–99)
Glucose-Capillary: 337 mg/dL — ABNORMAL HIGH (ref 70–99)
Glucose-Capillary: 229 mg/dL — ABNORMAL HIGH (ref 70–99)

## 2021-04-23 LAB — AMMONIA: Ammonia: 67 umol/L — ABNORMAL HIGH (ref 9–35)

## 2021-04-23 LAB — FIBRINOGEN: Fibrinogen: 84 mg/dL — CL (ref 210–475)

## 2021-04-23 MED ORDER — LACTULOSE 10 GM/15ML PO SOLN
30.0000 g | Freq: Four times a day (QID) | ORAL | Status: DC
  Administered 2021-04-23 – 2021-04-26 (×9): 30 g via ORAL
  Filled 2021-04-23 (×12): qty 45

## 2021-04-23 NOTE — Progress Notes (Signed)
[facility] PROGRESS NOTE   [name] YSA:[id number] DOB: 03/01/1966 DOA: 04/20/2021  3 DOS: the patient was seen and examined on 04/23/2021  [facility]P: [facility]  Brief History and Hospital Course:  56 y.o. female with medical history significant of NASH cirrhosis, hypertension, chronic normocytic anemia secondary to chronic illness, IDDM, chronic pancreatic insufficiency, chronic abdominal pain, chronic thrombocytopenia, chronic hypoxic respiratory failure baseline 2 L, presented with cough and increasing shortness of breath. Patient was recently hospitalized for ESBL UTI for which patient completed 7 days of meropenem treatment, same time patient also found to have worsening of anemia and received a total of 5 unit PRBC and last hemoglobin level 7.0 on discharge at [facility].  Evaluation in the emergency department raised concern for pulmonary edema.  She was hospitalized for further management.  Gastroenterology and cardiology were consulted.  Consultants: Gastroenterology.  Phone discussion with hematology by admitting team  Procedures: None    Subjective: Patient noted to be more awake and alert this morning compared to yesterday.  Denies any worsening of her abdominal discomfort.  No new complaints.  Shortness of breath is improving.     Assessment/Plan:   * Acute on chronic respiratory failure with hypoxia (HCC) Remains on 4 to 5 L of oxygen by nasal cannula.  Has bilateral pleural effusions.  Continue to monitor.   Respiratory status is stable.  She is on home oxygen at 4 to 5 L/min at baseline.  Acute on chronic diastolic CHF (congestive heart failure) ([place])- (present on admission) Echocardiogram from 2022 showed normal systolic function.  Repeat echocardiogram shows normal systolic function without any regional wall motion abnormalities.  Left ventricular diastolic parameters were indeterminate.  Mild LVH was noted.  Mild elevated troponin  likely from demand ischemia.  No chest pain is reported.   IV diuretics being continued.  She is negative by about 7 L so far. Weight has not been measured. Appreciate cardiology input.  Macrocytic anemia- (present on admission) Has chronic anemia.  No evidence of overt bleeding.  Required multiple units of transfusion during her recent hospitalization.  Case was discussed with hematology at the time of admission.  Low fibrinogen levels not uncommonly seen in liver disease.  Low concern for hemolytic anemia at this time. Gastroenterology was consulted and they are pursuing GI work-up. Hemoglobin stable for the most part.  No overt bleeding noted. Recent B12 level was 931.  Recent anemia panel was unremarkable. Hemoglobin noted to be 7.5 today.  We will recheck tomorrow.  Thrombocytopenia ([place])- (present on admission) Secondary to liver cirrhosis.  Stable.  Chronic liver failure without hepatic coma ([place])- (present on admission) NASH cirrhosis.  Worsening bilirubin noted.  According to [facility] patient is not a candidate for liver transplant.  Continue to monitor. Continue spironolactone.  Continue rifaximin. Ammonia level noted to be elevated.  Dose of lactulose increased. Appreciate gastroenterology input.  Patient underwent MRI abdomen with MRCP.  Dilated CBD was noted.  Defer further management to gastroenterology.  Coagulopathy ([place])- (present on admission) Secondary to liver cirrhosis.  INR is 3.2.  Hyperammonemia ([place])- (present on admission) Ammonia level noted to be elevated.  Increase dose of lactulose.  Diabetes mellitus without complication ([place]) NAT5T 5.8 in December.  Occasional high readings noted in glucose levels likely from dietary noncompliance.   Stable for the most part.  Continue SSI.    Acquired hypothyroidism- (present on admission) Continue levothyroxine.    DVT Prophylaxis: Auto anticoagulated Code Status: DNR Family  Communication: Discussed with patient.  No  family at bedside Disposition Plan: Return home when improved.  Status is: Inpatient Remains inpatient appropriate because: Acute on chronic respiratory failure with hypoxia, acute diastolic CHF  Planned Discharge Destination: Home with Home Health           Medications: Scheduled:  Chlorhexidine Gluconate Cloth  6 each Topical Daily   furosemide  20 mg Intravenous BID   insulin aspart  0-15 Units Subcutaneous TID WC   lactulose  30 g Oral QID   levothyroxine  50 mcg Oral QAC breakfast   lipase/protease/amylase  48,000 Units Oral TID AC   magnesium oxide  400 mg Oral Daily   pantoprazole  40 mg Oral Daily   rifaximin  550 mg Oral BID   spironolactone  100 mg Oral Daily   Vitamin D (Ergocalciferol)  50,000 Units Oral Q Wed   Continuous:  phytonadione (VITAMIN K) IV 10 mg (04/22/21 0937)   [id number], oxyCODONE  Antibiotics: Anti-infectives (From admission, onward)    Start     Dose/Rate Route Frequency Ordered Stop   04/20/21 2200  rifaximin (XIFAXAN) tablet 550 mg        550 mg Oral 2 times daily 04/20/21 1757         Objective:  Vital Signs  Vitals:   04/23/21 0340 04/23/21 0405 04/23/21 0800 04/23/21 0902  BP:  (!) 152/65 (!) 148/62   Pulse:  89 85   Resp: 17 (!) 22 17   Temp:  98.1 F (36.7 C) 99.4 F (37.4 C)   TempSrc:  Axillary Oral   SpO2:  96% 95% 94%    Intake/Output Summary (Last 24 hours) at 04/23/2021 1025 Last data filed at 04/23/2021 0816 Gross per 24 hour  Intake 350 ml  Output 2510 ml  Net -2160 ml    There were no vitals filed for this visit.   General appearance: Awake alert.  In no distress Resp: Clear to auscultation bilaterally.  Normal effort Cardio: S1-S2 is normal regular.  No S3-S4.  No rubs murmurs or bruit GI: Abdomen is soft.  Tender diffusely without any rebound.  No guarding.  Bowel sounds present normal.  No masses organomegaly. Extremities: Mild edema bilateral lower extremities Neurologic: She knew the year  the month today.  She knew she was in [place].  No obvious focal neurological deficits.    Lab Results:  Data Reviewed: I have personally reviewed labs and imaging study reports  CBC: Recent Labs  Lab 04/20/21 1857 04/21/21 0440 04/22/21 0214 04/23/21 0234  WBC 6.7 4.9 3.6* 2.9*  HGB 8.3* 7.7* 8.2* 7.5*  HCT 25.8* 23.9* 24.3* 22.8*  MCV 101.6* 100.8* 98.4 100.0  PLT 51* 47* 44* 40*     Basic Metabolic Panel: Recent Labs  Lab 04/20/21 1857 04/21/21 0440 04/22/21 0214 04/23/21 0234  NA 136 137 138 139  K 5.0 4.4 3.6 3.6  CL 100 100 99 99  CO2 28 27 29  34*  GLUCOSE 219* 262* 231* 224*  BUN 39* 39* 21* 15  CREATININE 1.21* 0.90 0.46 0.43*  CALCIUM 9.2 9.2 9.1 8.7*     GFR: Estimated Creatinine Clearance: 70.7 mL/min (A) (by C-G formula based on SCr of 0.43 mg/dL (L)).  Liver Function Tests: Recent Labs  Lab 04/20/21 1857 04/21/21 0440 04/22/21 0214 04/23/21 0234  AST 36 34 40 41  ALT 17 16 19 19   ALKPHOS 63 60 66 59  BILITOT 20.0* 19.5* 19.6* 19.2*  PROT 5.2* 4.9* 4.9*  4.8*  ALBUMIN 2.9* 2.7* 2.6* 2.5*     Coagulation Profile: Recent Labs  Lab 04/20/21 1857 04/21/21 0440 04/22/21 0214  INR 3.2* 3.2* 3.0*     CBG: Recent Labs  Lab 04/22/21 0757 04/22/21 1102 04/22/21 1609 04/22/21 2120 04/23/21 0812  GLUCAP 167* 145* 263* 180* 168*      Radiology Studies: DG Abd 2 Views  Result Date: 04/22/2021 CLINICAL DATA:  Abdominal pain EXAM: ABDOMEN - 2 VIEW COMPARISON:  02/25/2020 cholecystectomy clips FINDINGS: Are identified in the right upper quadrant of the abdomen. The bowel gas pattern is normal. There is no evidence of free air. No radio-opaque calculi or other significant radiographic abnormality is seen. Pleural effusions and bilateral pulmonary opacities are identified within the visualized portions of the lung bases. IMPRESSION: 1. Normal bowel gas pattern. 2. Previous cholecystectomy. 3. Bilateral pleural effusions and bilateral  pulmonary opacities. Electronically Signed   By: Kerby Moors M.D.   On: 04/22/2021 13:51   MR ABDOMEN MRCP W WO CONTAST  Result Date: 04/22/2021 CLINICAL DATA:  Jaundice, elevated bilirubin, NASH cirrhosis, ETOH EXAM: MRI ABDOMEN WITHOUT AND WITH CONTRAST (INCLUDING MRCP) TECHNIQUE: Multiplanar multisequence MR imaging of the abdomen was performed both before and after the administration of intravenous contrast. Heavily T2-weighted images of the biliary and pancreatic ducts were obtained, and three-dimensional MRCP images were rendered by post processing. CONTRAST:  6.79m GADAVIST GADOBUTROL 1 MMOL/ML IV SOLN COMPARISON:  Right upper quadrant ultrasound dated 04/06/2021. CT abdomen/pelvis dated 07/19/2020. FINDINGS: Motion degraded images. Lower chest: Small bilateral pleural effusions. Hepatobiliary: Cirrhosis. 14 mm cyst in the posterior right hepatic lobe (series 4/image 12). No suspicious/enhancing hepatic lesions. Status post cholecystectomy. Mild central intrahepatic ductal prominence (series 4/image 12), unchanged. Dilated common duct, measuring up to 17 mm (series 4/image 16), unchanged. No choledocholithiasis is seen. Pancreas: Grossly unremarkable. No peripancreatic fluid collection/pseudocyst. Spleen: Splenomegaly, measuring 15.7 cm in maximal craniocaudal dimension. Adrenals/Urinary Tract:  Adrenal glands are within normal limits. Subcentimeter left upper pole renal cyst (series 4/image 21). Right kidney is within normal limits. No hydronephrosis. Stomach/Bowel: Stomach is within normal limits. Visualized bowel is grossly unremarkable. Vascular/Lymphatic: No evidence of abdominal aortic aneurysm. 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 Perisplenic varices with splenorenal shunt. No suspicious abdominal lymphadenopathy. Other:  Small volume abdominal ascites. Body wall edema. Musculoskeletal: No focal osseous lesions. IMPRESSION: Cirrhosis.  No findings suspicious  for HCC. Status post cholecystectomy. Stable dilatation of the common duct, measuring up to 17 mm. No choledocholithiasis is seen. Consider ERCP as clinically warranted to exclude distal CBD stricture. Splenomegaly. Small volume abdominal ascites. 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 Small bilateral pleural effusions.  Body wall edema. Electronically Signed   By: SJulian HyM.D.   On: 04/22/2021 22:44   ECHOCARDIOGRAM COMPLETE  Result Date: 04/21/2021    ECHOCARDIOGRAM REPORT   Patient Name:   Joan ENCARNACIONDate of Exam: 04/21/2021 Medical Rec #:  [id number]        Height:       63.0 in Accession #:    [id number]       Weight:       144.7 lb Date of Birth:  [birth year]        BSA:          1.685 m Patient Age:    89years          BP:  121/55 mmHg Patient Gender: F                 HR:           78 bpm. Exam Location:  Inpatient Procedure: 2D Echo, Cardiac Doppler, Color Doppler and Strain Analysis Indications:    CHF  History:        Patient has prior history of Echocardiogram examinations, most                 recent 07/25/2020. COPD, Signs/Symptoms:Chest Pain and Murmur;                 Risk Factors:Diabetes and Hypertension.  Sonographer:    [facility] Referring Phys: [id number] Lequita Halt IMPRESSIONS  1. Left ventricular ejection fraction, by estimation, is 60 to 65%. The left ventricle has normal function. The left ventricle has no regional wall motion abnormalities. There is mild concentric left ventricular hypertrophy. Left ventricular diastolic parameters are indeterminate.  2. Right ventricular systolic function is normal. The right ventricular size is mildly enlarged. There is moderately elevated pulmonary artery systolic pressure.  3. Left atrial size was moderately dilated.  4. Right atrial size was mildly dilated.  5. The mitral valve is normal in structure. Moderate mitral valve regurgitation. No evidence of mitral stenosis.  6. Tricuspid  valve regurgitation is mild to moderate.  7. The aortic valve is tricuspid. Aortic valve regurgitation is not visualized. Mild aortic valve stenosis. Aortic valve area, by VTI measures 1.80 cm. Aortic valve mean gradient measures 15.7 mmHg. Aortic valve Vmax measures 2.62 m/s.  8. The inferior vena cava is dilated in size with <50% respiratory variability, suggesting right atrial pressure of 15 mmHg. FINDINGS  Left Ventricle: Left ventricular ejection fraction, by estimation, is 60 to 65%. The left ventricle has normal function. The left ventricle has no regional wall motion abnormalities. Global longitudinal strain performed but not reported based on interpreter judgement due to suboptimal tracking. The left ventricular internal cavity size was normal in size. There is mild concentric left ventricular hypertrophy. Left ventricular diastolic function could not be evaluated due to mitral regurgitation (moderate or greater). Left ventricular diastolic parameters are indeterminate. Right Ventricle: The right ventricular size is mildly enlarged. No increase in right ventricular wall thickness. Right ventricular systolic function is normal. There is moderately elevated pulmonary artery systolic pressure. The tricuspid regurgitant velocity is 3.02 m/s, and with an assumed right atrial pressure of 15 mmHg, the estimated right ventricular systolic pressure is 82.5 mmHg. Left Atrium: Left atrial size was moderately dilated. Right Atrium: Right atrial size was mildly dilated. Pericardium: There is no evidence of pericardial effusion. Mitral Valve: The mitral valve is normal in structure. Moderate mitral valve regurgitation. No evidence of mitral valve stenosis. Tricuspid Valve: The tricuspid valve is normal in structure. Tricuspid valve regurgitation is mild to moderate. No evidence of tricuspid stenosis. Aortic Valve: The aortic valve is tricuspid. Aortic valve regurgitation is not visualized. Mild aortic stenosis is  present. Aortic valve mean gradient measures 15.7 mmHg. Aortic valve peak gradient measures 27.5 mmHg. Aortic valve area, by VTI measures 1.80 cm. Pulmonic Valve: The pulmonic valve was normal in structure. Pulmonic valve regurgitation is not visualized. No evidence of pulmonic stenosis. Aorta: The aortic root is normal in size and structure. Venous: The inferior vena cava is dilated in size with less than 50% respiratory variability, suggesting right atrial pressure of 15 mmHg. IAS/Shunts: No atrial level shunt detected by color flow Doppler.  LEFT VENTRICLE  PLAX 2D LVIDd:         5.10 cm      Diastology LVIDs:         2.80 cm      LV e' medial:    6.42 cm/s LV PW:         1.10 cm      LV E/e' medial:  21.3 LV IVS:        1.00 cm      LV e' lateral:   9.79 cm/s LVOT diam:     2.00 cm      LV E/e' lateral: 14.0 LV SV:         100 LV SV Index:   59 LVOT Area:     3.14 cm  LV Volumes (MOD) LV vol d, MOD A2C: 109.0 ml LV vol d, MOD A4C: 123.0 ml LV vol s, MOD A2C: 29.3 ml LV vol s, MOD A4C: 50.2 ml LV SV MOD A2C:     79.7 ml LV SV MOD A4C:     123.0 ml LV SV MOD BP:      82.2 ml RIGHT VENTRICLE RV Basal diam:  4.60 cm RV Mid diam:    3.50 cm RV S prime:     24.40 cm/s TAPSE (M-mode): 3.2 cm LEFT ATRIUM             Index        RIGHT ATRIUM           Index LA diam:        3.40 cm 2.02 cm/m   RA Area:     18.10 cm LA Vol (A2C):   59.9 ml 35.55 ml/m  RA Volume:   50.00 ml  29.67 ml/m LA Vol (A4C):   84.3 ml 50.03 ml/m LA Biplane Vol: 72.9 ml 43.26 ml/m  AORTIC VALVE                     PULMONIC VALVE AV Area (Vmax):    1.81 cm      PV Vmax:       1.51 m/s AV Area (Vmean):   1.78 cm      PV Vmean:      86.500 cm/s AV Area (VTI):     1.80 cm      PV VTI:        0.249 m AV Vmax:           262.33 cm/s   PV Peak grad:  9.1 mmHg AV Vmean:          184.000 cm/s  PV Mean grad:  4.0 mmHg AV VTI:            0.553 m AV Peak Grad:      27.5 mmHg AV Mean Grad:      15.7 mmHg LVOT Vmax:         151.00 cm/s LVOT Vmean:         104.300 cm/s LVOT VTI:          0.317 m LVOT/AV VTI ratio: 0.57  AORTA Ao Root diam: 2.50 cm Ao Asc diam:  2.90 cm MITRAL VALVE                 TRICUSPID VALVE MV Area (PHT): 3.06 cm      TR Peak grad:   36.5 mmHg MV Decel Time: 248 msec      TR Vmax:        302.00 cm/s MR Peak  grad:   117.9 mmHg MR Mean grad:   80.0 mmHg    SHUNTS MR Vmax:        543.00 cm/s  Systemic VTI:  0.32 m MR Vmean:       421.0 cm/s   Systemic Diam: 2.00 cm MR PISA:        1.01 cm MR PISA Radius: 0.40 cm MV E velocity: 137.00 cm/s MV A velocity: 69.20 cm/s MV E/A ratio:  1.98 Kardie Tobb DO Electronically signed by Berniece Salines DO Signature Date/Time: 04/21/2021/5:27:36 PM    Final        LOS: 3 days   [facility] Pager on [email or web address]  04/23/2021, 10:25 AM

## 2021-04-23 NOTE — Progress Notes (Signed)
Pt refuses Bi-PAP for the night. RT will continue to monitor as needed. °

## 2021-04-23 NOTE — Progress Notes (Signed)
Progress Note  Patient Name: [name] Date of Encounter: 04/23/2021  [facility] Cardiologist: None   Subjective   Net -2.2 L yesterday, -6.9 L on admission.  Creatinine stable at 0.43.  Reports dyspnea has improved.  Inpatient Medications    Scheduled Meds:  Chlorhexidine Gluconate Cloth  6 each Topical Daily   furosemide  20 mg Intravenous BID   insulin aspart  0-15 Units Subcutaneous TID WC   lactulose  30 g Oral QID   levothyroxine  50 mcg Oral QAC breakfast   lipase/protease/amylase  48,000 Units Oral TID AC   magnesium oxide  400 mg Oral Daily   pantoprazole  40 mg Oral Daily   rifaximin  550 mg Oral BID   spironolactone  100 mg Oral Daily   Vitamin D (Ergocalciferol)  50,000 Units Oral Q Wed   Continuous Infusions:  phytonadione (VITAMIN K) IV 10 mg (04/22/21 0937)   PRN Meds: albuterol, oxyCODONE   Vital Signs    Vitals:   04/23/21 0340 04/23/21 0405 04/23/21 0800 04/23/21 0902  BP:  (!) 152/65 (!) 148/62   Pulse:  89 85   Resp: 17 (!) 22 17   Temp:  98.1 F (36.7 C) 99.4 F (37.4 C)   TempSrc:  Axillary Oral   SpO2:  96% 95% 94%    Intake/Output Summary (Last 24 hours) at 04/23/2021 0925 Last data filed at 04/23/2021 0816 Gross per 24 hour  Intake 350 ml  Output 2510 ml  Net -2160 ml   Last 3 Weights 04/15/2021 04/14/2021 04/13/2021  Weight (lbs) 144 lb 11.2 oz 149 lb 156 lb 12 oz  Weight (kg) 65.635 kg 67.586 kg 71.1 kg  Some encounter information is confidential and restricted. Go to Review Flowsheets activity to see all data.      Telemetry    Normal sinus rhythm with rate 80s- Personally Reviewed  ECG    No new EKG- Personally Reviewed  Physical Exam   GEN: No acute distress.   Neck: +JVD Cardiac: RRR, no murmurs, rubs, or gallops.  Respiratory: Clear to auscultation bilaterally. GI: Soft, nontender, non-distended  MS: No edema; No deformity. Neuro:  Nonfocal  Psych: Normal affect   Labs    High Sensitivity Troponin:    Recent Labs  Lab 04/20/21 1857 04/20/21 2142  TROPONINIHS 202* 172*     Chemistry Recent Labs  Lab 04/21/21 0440 04/22/21 0214 04/23/21 0234  NA 137 138 139  K 4.4 3.6 3.6  CL 100 99 99  CO2 27 29 34*  GLUCOSE 262* 231* 224*  BUN 39* 21* 15  CREATININE 0.90 0.46 0.43*  CALCIUM 9.2 9.1 8.7*  PROT 4.9* 4.9* 4.8*  ALBUMIN 2.7* 2.6* 2.5*  AST 34 40 41  ALT 16 19 19   ALKPHOS 60 66 59  BILITOT 19.5* 19.6* 19.2*  GFRNONAA >60 >60 >60  ANIONGAP 10 10 6     Lipids No results for input(s): CHOL, TRIG, HDL, LABVLDL, LDLCALC, CHOLHDL in the last 168 hours.  Hematology Recent Labs  Lab 04/21/21 0440 04/22/21 0214 04/23/21 0234  WBC 4.9 3.6* 2.9*  RBC 2.37* 2.47* 2.28*  HGB 7.7* 8.2* 7.5*  HCT 23.9* 24.3* 22.8*  MCV 100.8* 98.4 100.0  MCH 32.5 33.2 32.9  MCHC 32.2 33.7 32.9  RDW 26.2* 25.3* 25.4*  PLT 47* 44* 40*   Thyroid No results for input(s): TSH, FREET4 in the last 168 hours.  BNPNo results for input(s): BNP, PROBNP in the last 168 hours.  DDimer No results for input(s): DDIMER in the last 168 hours.   Radiology    DG Abd 2 Views  Result Date: 04/22/2021 CLINICAL DATA:  Abdominal pain EXAM: ABDOMEN - 2 VIEW COMPARISON:  02/25/2020 cholecystectomy clips FINDINGS: Are identified in the right upper quadrant of the abdomen. The bowel gas pattern is normal. There is no evidence of free air. No radio-opaque calculi or other significant radiographic abnormality is seen. Pleural effusions and bilateral pulmonary opacities are identified within the visualized portions of the lung bases. IMPRESSION: 1. Normal bowel gas pattern. 2. Previous cholecystectomy. 3. Bilateral pleural effusions and bilateral pulmonary opacities. Electronically Signed   By: Signa Kellaylor  Stroud M.D.   On: 04/22/2021 13:51   MR ABDOMEN MRCP W WO CONTAST  Result Date: 04/22/2021 CLINICAL DATA:  Jaundice, elevated bilirubin, NASH cirrhosis, ETOH EXAM: MRI ABDOMEN WITHOUT AND WITH CONTRAST (INCLUDING MRCP)  TECHNIQUE: Multiplanar multisequence MR imaging of the abdomen was performed both before and after the administration of intravenous contrast. Heavily T2-weighted images of the biliary and pancreatic ducts were obtained, and three-dimensional MRCP images were rendered by post processing. CONTRAST:  6.625mL GADAVIST GADOBUTROL 1 MMOL/ML IV SOLN COMPARISON:  Right upper quadrant ultrasound dated 04/06/2021. CT abdomen/pelvis dated 07/19/2020. FINDINGS: Motion degraded images. Lower chest: Small bilateral pleural effusions. Hepatobiliary: Cirrhosis. 14 mm cyst in the posterior right hepatic lobe (series 4/image 12). No suspicious/enhancing hepatic lesions. Status post cholecystectomy. Mild central intrahepatic ductal prominence (series 4/image 12), unchanged. Dilated common duct, measuring up to 17 mm (series 4/image 16), unchanged. No choledocholithiasis is seen. Pancreas: Grossly unremarkable. No peripancreatic fluid collection/pseudocyst. Spleen: Splenomegaly, measuring 15.7 cm in maximal craniocaudal dimension. Adrenals/Urinary Tract:  Adrenal glands are within normal limits. Subcentimeter left upper pole renal cyst (series 4/image 21). Right kidney is within normal limits. No hydronephrosis. Stomach/Bowel: Stomach is within normal limits. Visualized bowel is grossly unremarkable. Vascular/Lymphatic: No evidence of abdominal aortic aneurysm. 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 Perisplenic varices with splenorenal shunt. No suspicious abdominal lymphadenopathy. Other:  Small volume abdominal ascites. Body wall edema. Musculoskeletal: No focal osseous lesions. IMPRESSION: Cirrhosis.  No findings suspicious for HCC. Status post cholecystectomy. Stable dilatation of the common duct, measuring up to 17 mm. No choledocholithiasis is seen. Consider ERCP as clinically warranted to exclude distal CBD stricture. Splenomegaly. Small volume abdominal ascites. 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 Small bilateral pleural effusions.  Body wall edema. Electronically Signed   By: Charline BillsSriyesh  Krishnan M.D.   On: 04/22/2021 22:44   ECHOCARDIOGRAM COMPLETE  Result Date: 04/21/2021    ECHOCARDIOGRAM REPORT   Patient Name:   [name] Date of Exam: 04/21/2021 Medical Rec #:  [id number]         Height:       63.0 in Accession #:    [id number]        Weight:       144.7 lb Date of Birth:  Nov 15, 1965         BSA:          1.685 m Patient Age:    55 years          BP:           121/55 mmHg Patient Gender: F                 HR:           78 bpm. Exam Location:  Inpatient Procedure:  2D Echo, Cardiac Doppler, Color Doppler and Strain Analysis Indications:    CHF  History:        Patient has prior history of Echocardiogram examinations, most                 recent 07/25/2020. COPD, Signs/Symptoms:Chest Pain and Murmur;                 Risk Factors:Diabetes and Hypertension.  Sonographer:    [facility] Referring Phys: [id number] Emeline GeneralPING T ZHANG IMPRESSIONS  1. Left ventricular ejection fraction, by estimation, is 60 to 65%. The left ventricle has normal function. The left ventricle has no regional wall motion abnormalities. There is mild concentric left ventricular hypertrophy. Left ventricular diastolic parameters are indeterminate.  2. Right ventricular systolic function is normal. The right ventricular size is mildly enlarged. There is moderately elevated pulmonary artery systolic pressure.  3. Left atrial size was moderately dilated.  4. Right atrial size was mildly dilated.  5. The mitral valve is normal in structure. Moderate mitral valve regurgitation. No evidence of mitral stenosis.  6. Tricuspid valve regurgitation is mild to moderate.  7. The aortic valve is tricuspid. Aortic valve regurgitation is not visualized. Mild aortic valve stenosis. Aortic valve area, by VTI measures 1.80 cm. Aortic valve mean gradient measures 15.7 mmHg. Aortic valve Vmax measures 2.62  m/s.  8. The inferior vena cava is dilated in size with <50% respiratory variability, suggesting right atrial pressure of 15 mmHg. FINDINGS  Left Ventricle: Left ventricular ejection fraction, by estimation, is 60 to 65%. The left ventricle has normal function. The left ventricle has no regional wall motion abnormalities. Global longitudinal strain performed but not reported based on interpreter judgement due to suboptimal tracking. The left ventricular internal cavity size was normal in size. There is mild concentric left ventricular hypertrophy. Left ventricular diastolic function could not be evaluated due to mitral regurgitation (moderate or greater). Left ventricular diastolic parameters are indeterminate. Right Ventricle: The right ventricular size is mildly enlarged. No increase in right ventricular wall thickness. Right ventricular systolic function is normal. There is moderately elevated pulmonary artery systolic pressure. The tricuspid regurgitant velocity is 3.02 m/s, and with an assumed right atrial pressure of 15 mmHg, the estimated right ventricular systolic pressure is 51.5 mmHg. Left Atrium: Left atrial size was moderately dilated. Right Atrium: Right atrial size was mildly dilated. Pericardium: There is no evidence of pericardial effusion. Mitral Valve: The mitral valve is normal in structure. Moderate mitral valve regurgitation. No evidence of mitral valve stenosis. Tricuspid Valve: The tricuspid valve is normal in structure. Tricuspid valve regurgitation is mild to moderate. No evidence of tricuspid stenosis. Aortic Valve: The aortic valve is tricuspid. Aortic valve regurgitation is not visualized. Mild aortic stenosis is present. Aortic valve mean gradient measures 15.7 mmHg. Aortic valve peak gradient measures 27.5 mmHg. Aortic valve area, by VTI measures 1.80 cm. Pulmonic Valve: The pulmonic valve was normal in structure. Pulmonic valve regurgitation is not visualized. No evidence of pulmonic  stenosis. Aorta: The aortic root is normal in size and structure. Venous: The inferior vena cava is dilated in size with less than 50% respiratory variability, suggesting right atrial pressure of 15 mmHg. IAS/Shunts: No atrial level shunt detected by color flow Doppler.  LEFT VENTRICLE PLAX 2D LVIDd:         5.10 cm      Diastology LVIDs:         2.80 cm      LV e' medial:  6.42 cm/s LV PW:         1.10 cm      LV E/e' medial:  21.3 LV IVS:        1.00 cm      LV e' lateral:   9.79 cm/s LVOT diam:     2.00 cm      LV E/e' lateral: 14.0 LV SV:         100 LV SV Index:   59 LVOT Area:     3.14 cm  LV Volumes (MOD) LV vol d, MOD A2C: 109.0 ml LV vol d, MOD A4C: 123.0 ml LV vol s, MOD A2C: 29.3 ml LV vol s, MOD A4C: 50.2 ml LV SV MOD A2C:     79.7 ml LV SV MOD A4C:     123.0 ml LV SV MOD BP:      82.2 ml RIGHT VENTRICLE RV Basal diam:  4.60 cm RV Mid diam:    3.50 cm RV S prime:     24.40 cm/s TAPSE (M-mode): 3.2 cm LEFT ATRIUM             Index        RIGHT ATRIUM           Index LA diam:        3.40 cm 2.02 cm/m   RA Area:     18.10 cm LA Vol (A2C):   59.9 ml 35.55 ml/m  RA Volume:   50.00 ml  29.67 ml/m LA Vol (A4C):   84.3 ml 50.03 ml/m LA Biplane Vol: 72.9 ml 43.26 ml/m  AORTIC VALVE                     PULMONIC VALVE AV Area (Vmax):    1.81 cm      PV Vmax:       1.51 m/s AV Area (Vmean):   1.78 cm      PV Vmean:      86.500 cm/s AV Area (VTI):     1.80 cm      PV VTI:        0.249 m AV Vmax:           262.33 cm/s   PV Peak grad:  9.1 mmHg AV Vmean:          184.000 cm/s  PV Mean grad:  4.0 mmHg AV VTI:            0.553 m AV Peak Grad:      27.5 mmHg AV Mean Grad:      15.7 mmHg LVOT Vmax:         151.00 cm/s LVOT Vmean:        104.300 cm/s LVOT VTI:          0.317 m LVOT/AV VTI ratio: 0.57  AORTA Ao Root diam: 2.50 cm Ao Asc diam:  2.90 cm MITRAL VALVE                 TRICUSPID VALVE MV Area (PHT): 3.06 cm      TR Peak grad:   36.5 mmHg MV Decel Time: 248 msec      TR Vmax:        302.00 cm/s MR Peak  grad:   117.9 mmHg MR Mean grad:   80.0 mmHg    SHUNTS MR Vmax:        543.00 cm/s  Systemic VTI:  0.32 m MR Vmean:  421.0 cm/s   Systemic Diam: 2.00 cm MR PISA:        1.01 cm MR PISA Radius: 0.40 cm MV E velocity: 137.00 cm/s MV A velocity: 69.20 cm/s MV E/A ratio:  1.98 Kardie Tobb DO Electronically signed by Thomasene Ripple DO Signature Date/Time: 04/21/2021/5:27:36 PM    Final     Cardiac Studies   Echo 04/21/21:  1. Left ventricular ejection fraction, by estimation, is 60 to 65%. The  left ventricle has normal function. The left ventricle has no regional  wall motion abnormalities. There is mild concentric left ventricular  hypertrophy. Left ventricular diastolic  parameters are indeterminate.   2. Right ventricular systolic function is normal. The right ventricular  size is mildly enlarged. There is moderately elevated pulmonary artery  systolic pressure.   3. Left atrial size was moderately dilated.   4. Right atrial size was mildly dilated.   5. The mitral valve is normal in structure. Moderate mitral valve  regurgitation. No evidence of mitral stenosis.   6. Tricuspid valve regurgitation is mild to moderate.   7. The aortic valve is tricuspid. Aortic valve regurgitation is not  visualized. Mild aortic valve stenosis. Aortic valve area, by VTI measures  1.80 cm. Aortic valve mean gradient measures 15.7 mmHg. Aortic valve Vmax  measures 2.62 m/s.   8. The inferior vena cava is dilated in size with <50% respiratory  variability, suggesting right atrial pressure of 15 mmHg.   Patient Profile     56 y.o. female with a hx of cirrhosis, hypertension, IDDM, anemia, thrombocytopenia, chronic hypoxic respiratory failure who is being seen 04/22/2021 for preop evaluation   Assessment & Plan    Preop evaluation: Prior to EGD.  Denies any chest pain.  Dyspnea improving with diuresis.  Limited functional activity, less than 4 METS.  Given low risk procedure, no further cardiac work-up  recommended prior to procedure.  Her volume status has improved with diuresis, she is back to baseline 4 L [place].  OK to proceed with EGD from cardiac standpoint, though per GI holding off for now given thrombocytopenia   Acute on chronic diastolic heart failure: Echocardiogram 2/1 showed EF 60 to 65%, indeterminant diastolic function, normal RV function, moderate pulmonary hypertension (RVSP 52), mild aortic stenosis, moderate MR, RAP 15.  Presented with dyspnea and hypervolemia, improving with diuresis -She has diuresed well on IV Lasix 20 mg twice daily and spironolactone 100 mg daily.  Net 7 L on admission.  Still with mild JVD but volume status has significantly improved.  Would continue IV diuresis for today, suspect can switch to p.o. tomorrow   Acute on chronic hypoxic respiratory failure: Currently weaned to baseline 4 L [place].  Continue diuresis as above.   Elevated troponin: 202 >172.  Likely demand ischemia in setting of acute on chronic diastolic heart failure and anemia.   Anemia: GI planning EGD as above   Cirrhosis: Has been evaluated for liver transplant at [facility], not felt to be candidate.  On lactulose, rifaximin  For questions or updates, please contact [facility] Please consult [email or web address] for contact info under        Signed, Little Ishikawa, MD  04/23/2021, 9:25 AM

## 2021-04-23 NOTE — Plan of Care (Signed)

## 2021-04-23 NOTE — Plan of Care (Signed)

## 2021-04-23 NOTE — Progress Notes (Signed)
Progress Note   Subjective  Chief Complaint: Cirrhosis, acute on chronic anemia  Patient lying in bed, complaining of AB pain but states she always has it.  States breathing improving some, on 4 L. Had BM this AM, liquid yellow stool, no melena or hemtochezia No N, V.     Objective   Vital signs in last 24 hours: Temp:  [98 F (36.7 C)-99.4 F (37.4 C)] 99.4 F (37.4 C) (02/03 0800) Pulse Rate:  [35-99] 85 (02/03 0800) Resp:  [13-30] 17 (02/03 0800) BP: (134-155)/(55-76) 148/62 (02/03 0800) SpO2:  [85 %-96 %] 94 % (02/03 0902) Last BM Date: 04/22/21  General: Patient is chronically ill, juandice Lungs: coarse breath sounds, Gaston in place Heart: RRR. Abdomen: Soft, nondistended.  No fluid wave or obvious ascites.  Tenderness is mild to moderate diffusely without guarding or rebound.  Bowel sounds normal, active..   Musc/Skeltl: No joint redness, swelling or significant deformity. Extremities: Pedal edema bilateral.  Neurologic: Pleasant, calm.  No tremors, no asterixis.  Moves all 4 limbs, formal strength testing not performed    Intake/Output from previous day: 02/02 0701 - 02/03 0700 In: 350 [P.O.:240; Blood:110] Out: 2550 [Urine:2550] Intake/Output this shift: Total I/O In: -  Out: 260 [Urine:260]  Lab Results: Recent Labs    04/21/21 0440 04/22/21 0214 04/23/21 0234  WBC 4.9 3.6* 2.9*  HGB 7.7* 8.2* 7.5*  HCT 23.9* 24.3* 22.8*  PLT 47* 44* 40*   BMET Recent Labs    04/21/21 0440 04/22/21 0214 04/23/21 0234  NA 137 138 139  K 4.4 3.6 3.6  CL 100 99 99  CO2 27 29 34*  GLUCOSE 262* 231* 224*  BUN 39* 21* 15  CREATININE 0.90 0.46 0.43*  CALCIUM 9.2 9.1 8.7*   LFT Recent Labs    04/23/21 0234  PROT 4.8*  ALBUMIN 2.5*  AST 41  ALT 19  ALKPHOS 59  BILITOT 19.2*   PT/INR Recent Labs    04/21/21 0440 04/22/21 0214  LABPROT 32.5* 31.0*  INR 3.2* 3.0*    Studies/Results: DG Abd 2 Views  Result Date: 04/22/2021 CLINICAL DATA:   Abdominal pain EXAM: ABDOMEN - 2 VIEW COMPARISON:  02/25/2020 cholecystectomy clips FINDINGS: Are identified in the right upper quadrant of the abdomen. The bowel gas pattern is normal. There is no evidence of free air. No radio-opaque calculi or other significant radiographic abnormality is seen. Pleural effusions and bilateral pulmonary opacities are identified within the visualized portions of the lung bases. IMPRESSION: 1. Normal bowel gas pattern. 2. Previous cholecystectomy. 3. Bilateral pleural effusions and bilateral pulmonary opacities. Electronically Signed   By: Kerby Moors M.D.   On: 04/22/2021 13:51   MR ABDOMEN MRCP W WO CONTAST  Result Date: 04/22/2021 CLINICAL DATA:  Jaundice, elevated bilirubin, NASH cirrhosis, ETOH EXAM: MRI ABDOMEN WITHOUT AND WITH CONTRAST (INCLUDING MRCP) TECHNIQUE: Multiplanar multisequence MR imaging of the abdomen was performed both before and after the administration of intravenous contrast. Heavily T2-weighted images of the biliary and pancreatic ducts were obtained, and three-dimensional MRCP images were rendered by post processing. CONTRAST:  6.56m GADAVIST GADOBUTROL 1 MMOL/ML IV SOLN COMPARISON:  Right upper quadrant ultrasound dated 04/06/2021. CT abdomen/pelvis dated 07/19/2020. FINDINGS: Motion degraded images. Lower chest: Small bilateral pleural effusions. Hepatobiliary: Cirrhosis. 14 mm cyst in the posterior right hepatic lobe (series 4/image 12). No suspicious/enhancing hepatic lesions. Status post cholecystectomy. Mild central intrahepatic ductal prominence (series 4/image 12), unchanged. Dilated common duct, measuring up to 17 mm (series  4/image 16), unchanged. No choledocholithiasis is seen. Pancreas: Grossly unremarkable. No peripancreatic fluid collection/pseudocyst. Spleen: Splenomegaly, measuring 15.7 cm in maximal craniocaudal dimension. Adrenals/Urinary Tract:  Adrenal glands are within normal limits. Subcentimeter left upper pole renal cyst  (series 4/image 21). Right kidney is within normal limits. No hydronephrosis. Stomach/Bowel: Stomach is within normal limits. Visualized bowel is grossly unremarkable. Vascular/Lymphatic: No evidence of abdominal aortic aneurysm. 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 Perisplenic varices with splenorenal shunt. No suspicious abdominal lymphadenopathy. Other:  Small volume abdominal ascites. Body wall edema. Musculoskeletal: No focal osseous lesions. IMPRESSION: Cirrhosis.  No findings suspicious for HCC. Status post cholecystectomy. Stable dilatation of the common duct, measuring up to 17 mm. No choledocholithiasis is seen. Consider ERCP as clinically warranted to exclude distal CBD stricture. Splenomegaly. Small volume abdominal ascites. 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 Small bilateral pleural effusions.  Body wall edema. Electronically Signed   By: Julian Hy M.D.   On: 04/22/2021 22:44   ECHOCARDIOGRAM COMPLETE  Result Date: 04/21/2021    ECHOCARDIOGRAM REPORT   Patient Name:   [name] Date of Exam: 04/21/2021 Medical Rec #:  [id number]         Height:       63.0 in Accession #:    [id number]        Weight:       144.7 lb Date of Birth:  1966-03-15         BSA:          1.685 m Patient Age:    56 years          BP:           121/55 mmHg Patient Gender: F                 HR:           78 bpm. Exam Location:  Inpatient Procedure: 2D Echo, Cardiac Doppler, Color Doppler and Strain Analysis Indications:    CHF  History:        Patient has prior history of Echocardiogram examinations, most                 recent 07/25/2020. COPD, Signs/Symptoms:Chest Pain and Murmur;                 Risk Factors:Diabetes and Hypertension.  Sonographer:    [facility] Referring Phys: [id number] Lequita Halt IMPRESSIONS  1. Left ventricular ejection fraction, by estimation, is 60 to 65%. The left ventricle has normal function. The left ventricle  has no regional wall motion abnormalities. There is mild concentric left ventricular hypertrophy. Left ventricular diastolic parameters are indeterminate.  2. Right ventricular systolic function is normal. The right ventricular size is mildly enlarged. There is moderately elevated pulmonary artery systolic pressure.  3. Left atrial size was moderately dilated.  4. Right atrial size was mildly dilated.  5. The mitral valve is normal in structure. Moderate mitral valve regurgitation. No evidence of mitral stenosis.  6. Tricuspid valve regurgitation is mild to moderate.  7. The aortic valve is tricuspid. Aortic valve regurgitation is not visualized. Mild aortic valve stenosis. Aortic valve area, by VTI measures 1.80 cm. Aortic valve mean gradient measures 15.7 mmHg. Aortic valve Vmax measures 2.62 m/s.  8. The inferior vena cava is dilated in size with <50% respiratory variability, suggesting right atrial pressure of 15 mmHg. FINDINGS  Left Ventricle: Left ventricular ejection fraction,  by estimation, is 60 to 65%. The left ventricle has normal function. The left ventricle has no regional wall motion abnormalities. Global longitudinal strain performed but not reported based on interpreter judgement due to suboptimal tracking. The left ventricular internal cavity size was normal in size. There is mild concentric left ventricular hypertrophy. Left ventricular diastolic function could not be evaluated due to mitral regurgitation (moderate or greater). Left ventricular diastolic parameters are indeterminate. Right Ventricle: The right ventricular size is mildly enlarged. No increase in right ventricular wall thickness. Right ventricular systolic function is normal. There is moderately elevated pulmonary artery systolic pressure. The tricuspid regurgitant velocity is 3.02 m/s, and with an assumed right atrial pressure of 15 mmHg, the estimated right ventricular systolic pressure is 37.8 mmHg. Left Atrium: Left atrial size  was moderately dilated. Right Atrium: Right atrial size was mildly dilated. Pericardium: There is no evidence of pericardial effusion. Mitral Valve: The mitral valve is normal in structure. Moderate mitral valve regurgitation. No evidence of mitral valve stenosis. Tricuspid Valve: The tricuspid valve is normal in structure. Tricuspid valve regurgitation is mild to moderate. No evidence of tricuspid stenosis. Aortic Valve: The aortic valve is tricuspid. Aortic valve regurgitation is not visualized. Mild aortic stenosis is present. Aortic valve mean gradient measures 15.7 mmHg. Aortic valve peak gradient measures 27.5 mmHg. Aortic valve area, by VTI measures 1.80 cm. Pulmonic Valve: The pulmonic valve was normal in structure. Pulmonic valve regurgitation is not visualized. No evidence of pulmonic stenosis. Aorta: The aortic root is normal in size and structure. Venous: The inferior vena cava is dilated in size with less than 50% respiratory variability, suggesting right atrial pressure of 15 mmHg. IAS/Shunts: No atrial level shunt detected by color flow Doppler.  LEFT VENTRICLE PLAX 2D LVIDd:         5.10 cm      Diastology LVIDs:         2.80 cm      LV e' medial:    6.42 cm/s LV PW:         1.10 cm      LV E/e' medial:  21.3 LV IVS:        1.00 cm      LV e' lateral:   9.79 cm/s LVOT diam:     2.00 cm      LV E/e' lateral: 14.0 LV SV:         100 LV SV Index:   59 LVOT Area:     3.14 cm  LV Volumes (MOD) LV vol d, MOD A2C: 109.0 ml LV vol d, MOD A4C: 123.0 ml LV vol s, MOD A2C: 29.3 ml LV vol s, MOD A4C: 50.2 ml LV SV MOD A2C:     79.7 ml LV SV MOD A4C:     123.0 ml LV SV MOD BP:      82.2 ml RIGHT VENTRICLE RV Basal diam:  4.60 cm RV Mid diam:    3.50 cm RV S prime:     24.40 cm/s TAPSE (M-mode): 3.2 cm LEFT ATRIUM             Index        RIGHT ATRIUM           Index LA diam:        3.40 cm 2.02 cm/m   RA Area:     18.10 cm LA Vol (A2C):   59.9 ml 35.55 ml/m  RA Volume:   50.00 ml  29.67 ml/m LA  Vol (A4C):    84.3 ml 50.03 ml/m LA Biplane Vol: 72.9 ml 43.26 ml/m  AORTIC VALVE                     PULMONIC VALVE AV Area (Vmax):    1.81 cm      PV Vmax:       1.51 m/s AV Area (Vmean):   1.78 cm      PV Vmean:      86.500 cm/s AV Area (VTI):     1.80 cm      PV VTI:        0.249 m AV Vmax:           262.33 cm/s   PV Peak grad:  9.1 mmHg AV Vmean:          184.000 cm/s  PV Mean grad:  4.0 mmHg AV VTI:            0.553 m AV Peak Grad:      27.5 mmHg AV Mean Grad:      15.7 mmHg LVOT Vmax:         151.00 cm/s LVOT Vmean:        104.300 cm/s LVOT VTI:          0.317 m LVOT/AV VTI ratio: 0.57  AORTA Ao Root diam: 2.50 cm Ao Asc diam:  2.90 cm MITRAL VALVE                 TRICUSPID VALVE MV Area (PHT): 3.06 cm      TR Peak grad:   36.5 mmHg MV Decel Time: 248 msec      TR Vmax:        302.00 cm/s MR Peak grad:   117.9 mmHg MR Mean grad:   80.0 mmHg    SHUNTS MR Vmax:        543.00 cm/s  Systemic VTI:  0.32 m MR Vmean:       421.0 cm/s   Systemic Diam: 2.00 cm MR PISA:        1.01 cm MR PISA Radius: 0.40 cm MV E velocity: 137.00 cm/s MV A velocity: 69.20 cm/s MV E/A ratio:  1.98 Kardie Tobb DO Electronically signed by Berniece Salines DO Signature Date/Time: 04/21/2021/5:27:36 PM    Final       Impression/Plan:   Acute on chronic anemia  HGB 7.5 (8.2) (7.7) MCV 100 Platelets 44 04/13/2021 Iron 113 Ferritin 234 B12 931 No overt bleeding, HGB slight decreased from 8.2 to 7.5 and becoming more hemoconcentrated.  Slight elevation in BUN but has corrected  pancytopenia likely from cirrhosis, last retic 07/2020 normal, path smear unremarkable 04/15/21  Patient has high risk for endoscopic procedures, getting diuresed, per cardio she is as optimized as can be from cardiac standpoint for procedures Appreciate cardiac input Timing of possible endoscopic procedure per Dr. Barnie Del Cirrhosis of the liver with marked elevation bilirubin AST 41 ALT 19  Alkphos 59 TBili 19.2 INR 04/22/2021 3.0  MELD-Na score: 30 at 04/23/2021   2:34 AM MELD score: 30 at 04/23/2021  2:34 AM Calculated from: Serum Creatinine: 0.43 mg/dL (Using min of 1 mg/dL) at 04/23/2021  2:34 AM Serum Sodium: 139 mmol/L (Using max of 137 mmol/L) at 04/23/2021  2:34 AM Total Bilirubin: 19.2 mg/dL at 04/23/2021  2:34 AM INR(ratio): 3.0 at 04/22/2021  2:14 AM Age: 56 years Pending MRI MRCP  Thrombocytopenia/Pancytopenia Platelets 40, would want above 50 prior to procedures.  WBC 2.9   Hypercoagulability INR 3.0, continue to give vitamin K due to elevated INR  INR daily   Hx HE.   No signs of acute encephalopathy.  On stable doses Xifaxan, lactulose Titrate lactulose dose to 2-3 BM's a day   Elevated troponin/acute CHF   Echo 60 to 65% ventricular hypertrophy, no regional wall motion abnormalities moderately elevated pulmonary artery systolic pressure mild aortic valve stenosis, moderate mitral valve regurgitation  chest CT shows pneumonia vs edema in lower lobes as well as bilateral groundglass opacities and moderate pleural effusions. Getting diuresis now, cardiology suggest holding off on endoscopy until patient more euvolemic and cardiac status optimized-cardiology was in the room, states improving status, potential for endoscopy tomorrow.   Chronic nausea  Longstanding.  Not on PPI.  Stable 4 times daily doses oxycodone, History of gastroparesis   Type II, IDDM.  Chronic hypoxia  On 4 L P[place] which is baseline     No future appointments.    LOS: 3 days   Vladimir Crofts  04/23/2021, 9:44 AM

## 2021-04-23 NOTE — Progress Notes (Signed)
Inpatient Diabetes Program Recommendations  AACE/ADA: New Consensus Statement on Inpatient Glycemic Control   Target Ranges:  Prepandial:   less than 140 mg/dL      Peak postprandial:   less than 180 mg/dL (1-2 hours)      Critically ill patients:  140 - 180 mg/dL    Latest Reference Range & Units 04/22/21 07:57 04/22/21 11:02 04/22/21 16:09 04/22/21 21:20 04/23/21 08:12  Glucose-Capillary 70 - 99 mg/dL 167 (H) 145 (H) 263 (H) 180 (H) 168 (H)   Review of Glycemic Control  Diabetes history: DM2 Outpatient Diabetes medications: OmniPod insulin pump with Novolog Current orders for Inpatient glycemic control: Novolog 0-15 units TID with meals  Inpatient Diabetes Program Recommendations:    Insulin: Please consider ordering Novolog 0-5 units QHS. If post prandial glucose is consistently over 180 mg/dl, may want to consider ordering Novolog 2 or 3 units TID with meals for meal coverage if patient eats at least 50% of meals.  NOTE: Patient well known to inpatient diabetes team due to frequent admissions (last inpatient 04/06/21-04/15/21). Patient has DM2 and uses an insulin pump for DM control outpatient. Patient sees Dr. Bernie Covey with [facility] and was last seen on 12/21/20.   Thanks, Barnie Alderman, RN, MSN, CDE Diabetes Coordinator Inpatient Diabetes Program [phone] (Team Pager from 8am to 5pm)

## 2021-04-24 DIAGNOSIS — J9621 Acute and chronic respiratory failure with hypoxia: Secondary | ICD-10-CM | POA: Diagnosis not present

## 2021-04-24 LAB — COMPREHENSIVE METABOLIC PANEL
ALT: 17 U/L (ref 0–44)
AST: 46 U/L — ABNORMAL HIGH (ref 15–41)
Albumin: 2.4 g/dL — ABNORMAL LOW (ref 3.5–5.0)
Alkaline Phosphatase: 74 U/L (ref 38–126)
Anion gap: 8 (ref 5–15)
BUN: 13 mg/dL (ref 6–20)
CO2: 34 mmol/L — ABNORMAL HIGH (ref 22–32)
Calcium: 8.9 mg/dL (ref 8.9–10.3)
Chloride: 97 mmol/L — ABNORMAL LOW (ref 98–111)
Creatinine, Ser: 0.39 mg/dL — ABNORMAL LOW (ref 0.44–1.00)
GFR, Estimated: >60 mL/min (ref >60)
Glucose, Bld: 261 mg/dL — ABNORMAL HIGH (ref 70–99)
Potassium: 3.6 mmol/L (ref 3.5–5.1)
Sodium: 139 mmol/L (ref 135–145)
Total Bilirubin: 19.1 mg/dL (ref 0.3–1.2)
Total Protein: 4.7 g/dL — ABNORMAL LOW (ref 6.5–8.1)

## 2021-04-24 LAB — HEMOGLOBIN AND HEMATOCRIT, BLOOD
HCT: 28 % — ABNORMAL LOW (ref 36.0–46.0)
Hemoglobin: 9.4 g/dL — ABNORMAL LOW (ref 12.0–15.0)

## 2021-04-24 LAB — GLUCOSE, CAPILLARY
Glucose-Capillary: 240 mg/dL — ABNORMAL HIGH (ref 70–99)
Glucose-Capillary: 350 mg/dL — ABNORMAL HIGH (ref 70–99)
Glucose-Capillary: 425 mg/dL — ABNORMAL HIGH (ref 70–99)
Glucose-Capillary: 230 mg/dL — ABNORMAL HIGH (ref 70–99)

## 2021-04-24 LAB — URINE CULTURE: Culture: NO GROWTH

## 2021-04-24 LAB — CBC
HCT: 22.3 % — ABNORMAL LOW (ref 36.0–46.0)
Hemoglobin: 7.2 g/dL — ABNORMAL LOW (ref 12.0–15.0)
MCH: 32 pg (ref 26.0–34.0)
MCHC: 32.3 g/dL (ref 30.0–36.0)
MCV: 99.1 fL (ref 80.0–100.0)
Platelets: 40 10*3/uL — ABNORMAL LOW (ref 150–400)
RBC: 2.25 MIL/uL — ABNORMAL LOW (ref 3.87–5.11)
RDW: 24.4 % — ABNORMAL HIGH (ref 11.5–15.5)
WBC: 2.8 10*3/uL — ABNORMAL LOW (ref 4.0–10.5)
nRBC: 0 % (ref 0.0–0.2)

## 2021-04-24 LAB — PREPARE RBC (CROSSMATCH)

## 2021-04-24 LAB — AMMONIA: Ammonia: 67 umol/L — ABNORMAL HIGH (ref 9–35)

## 2021-04-24 MED ORDER — FUROSEMIDE 10 MG/ML IJ SOLN
20.0000 mg | Freq: Once | INTRAMUSCULAR | Status: AC
  Administered 2021-04-24: 20 mg via INTRAVENOUS
  Filled 2021-04-24: qty 2

## 2021-04-24 MED ORDER — INSULIN ASPART 100 UNIT/ML IJ SOLN
25.0000 [IU] | Freq: Once | INTRAMUSCULAR | Status: AC
  Administered 2021-04-24: 25 [IU] via SUBCUTANEOUS

## 2021-04-24 MED ORDER — FUROSEMIDE 40 MG PO TABS
40.0000 mg | ORAL_TABLET | Freq: Every day | ORAL | Status: DC
  Administered 2021-04-24 – 2021-04-26 (×3): 40 mg via ORAL
  Filled 2021-04-24 (×3): qty 1

## 2021-04-24 MED ORDER — SODIUM CHLORIDE 0.9% IV SOLUTION: Freq: Once | INTRAVENOUS | Status: AC

## 2021-04-24 MED ORDER — INSULIN ASPART 100 UNIT/ML IJ SOLN
0.0000 [IU] | Freq: Every day | INTRAMUSCULAR | Status: DC
  Administered 2021-04-24 – 2021-04-25 (×2): 2 [IU] via SUBCUTANEOUS

## 2021-04-24 MED ORDER — INSULIN GLARGINE-YFGN 100 UNIT/ML ~~LOC~~ SOLN
10.0000 [IU] | Freq: Two times a day (BID) | SUBCUTANEOUS | Status: DC
  Administered 2021-04-24 – 2021-04-26 (×4): 10 [IU] via SUBCUTANEOUS
  Filled 2021-04-24 (×5): qty 0.1

## 2021-04-24 MED ORDER — INSULIN ASPART 100 UNIT/ML IJ SOLN
0.0000 [IU] | Freq: Three times a day (TID) | INTRAMUSCULAR | Status: DC
  Administered 2021-04-25: 7 [IU] via SUBCUTANEOUS
  Administered 2021-04-25: 4 [IU] via SUBCUTANEOUS
  Administered 2021-04-26: 7 [IU] via SUBCUTANEOUS
  Administered 2021-04-26: 3 [IU] via SUBCUTANEOUS

## 2021-04-24 MED ORDER — INSULIN GLARGINE-YFGN 100 UNIT/ML ~~LOC~~ SOLN
10.0000 [IU] | Freq: Every day | SUBCUTANEOUS | Status: DC
  Administered 2021-04-24: 10 [IU] via SUBCUTANEOUS
  Filled 2021-04-24: qty 0.1

## 2021-04-24 NOTE — Progress Notes (Addendum)
Progress Note  Patient Name: [name] Date of Encounter: 04/24/2021  [facility] Cardiologist: None   Subjective   She was able to lie flat last night. On O2 at home. She states the plan is for her to go home soon.  Net negative 2.5L Total net -9.3L Crt 0.39 Hgb 7s  Wt Readings from Last 3 Encounters:  04/24/21 58.6 kg  04/15/21 65.6 kg  03/17/21 58.9 kg     Inpatient Medications    Scheduled Meds:  Chlorhexidine Gluconate Cloth  6 each Topical Daily   furosemide  20 mg Intravenous BID   insulin aspart  0-15 Units Subcutaneous TID WC   lactulose  30 g Oral QID   levothyroxine  50 mcg Oral QAC breakfast   lipase/protease/amylase  48,000 Units Oral TID AC   magnesium oxide  400 mg Oral Daily   pantoprazole  40 mg Oral Daily   rifaximin  550 mg Oral BID   spironolactone  100 mg Oral Daily   Vitamin D (Ergocalciferol)  50,000 Units Oral Q Wed   Continuous Infusions:  phytonadione (VITAMIN K) IV 10 mg (04/23/21 1245)   PRN Meds: albuterol, oxyCODONE   Vital Signs    Vitals:   04/24/21 0000 04/24/21 0338 04/24/21 0427 04/24/21 0744  BP: (!) 140/50  (!) 136/49 (!) 147/67  Pulse: 77  85 83  Resp: 12  20 11   Temp: 98.7 F (37.1 C)  98.8 F (37.1 C) 98.9 F (37.2 C)  TempSrc: Oral  Oral Oral  SpO2: 97%  92% 95%  Weight:  58.6 kg      Intake/Output Summary (Last 24 hours) at 04/24/2021 0837 Last data filed at 04/24/2021 0500 Gross per 24 hour  Intake 200 ml  Output 2451 ml  Net -2251 ml   Last 3 Weights 04/24/2021 04/15/2021 04/14/2021  Weight (lbs) 129 lb 3 oz 144 lb 11.2 oz 149 lb  Weight (kg) 58.6 kg 65.635 kg 67.586 kg  Some encounter information is confidential and restricted. Go to Review Flowsheets activity to see all data.      Telemetry    NSR- Personally Reviewed  ECG    No new EKG- Personally Reviewed  Physical Exam   GEN: No acute distress.   HEENT: sclera jaundice Neck: +JVD Cardiac: RRR, holosystolic murmur radiating to  the apex, rubs, or gallops.  Respiratory: nl wob, mild decreased BS in the bases GI: Soft, nontender, non-distended  MS: No edema; No deformity. Neuro:  Nonfocal  Skin: jaundice Psych: Normal affect   Labs    High Sensitivity Troponin:   Recent Labs  Lab 04/20/21 1857 04/20/21 2142  TROPONINIHS 202* 172*     Chemistry Recent Labs  Lab 04/22/21 0214 04/23/21 0234 04/24/21 0704  NA 138 139 139  K 3.6 3.6 3.6  CL 99 99 97*  CO2 29 34* 34*  GLUCOSE 231* 224* 261*  BUN 21* 15 13  CREATININE 0.46 0.43* 0.39*  CALCIUM 9.1 8.7* 8.9  PROT 4.9* 4.8* 4.7*  ALBUMIN 2.6* 2.5* 2.4*  AST 40 41 46*  ALT 19 19 17   ALKPHOS 66 59 74  BILITOT 19.6* 19.2* 19.1*  GFRNONAA >60 >60 >60  ANIONGAP 10 6 8     Lipids No results for input(s): CHOL, TRIG, HDL, LABVLDL, LDLCALC, CHOLHDL in the last 168 hours.  Hematology Recent Labs  Lab 04/22/21 0214 04/23/21 0234 04/24/21 0704  WBC 3.6* 2.9* 2.8*  RBC 2.47* 2.28* 2.25*  HGB 8.2* 7.5* 7.2*  HCT 24.3* 22.8* 22.3*  MCV 98.4 100.0 99.1  MCH 33.2 32.9 32.0  MCHC 33.7 32.9 32.3  RDW 25.3* 25.4* 24.4*  PLT 44* 40* 40*   Thyroid No results for input(s): TSH, FREET4 in the last 168 hours.  BNPNo results for input(s): BNP, PROBNP in the last 168 hours.  DDimer No results for input(s): DDIMER in the last 168 hours.   Radiology    DG Abd 2 Views  Result Date: 04/22/2021 CLINICAL DATA:  Abdominal pain EXAM: ABDOMEN - 2 VIEW COMPARISON:  02/25/2020 cholecystectomy clips FINDINGS: Are identified in the right upper quadrant of the abdomen. The bowel gas pattern is normal. There is no evidence of free air. No radio-opaque calculi or other significant radiographic abnormality is seen. Pleural effusions and bilateral pulmonary opacities are identified within the visualized portions of the lung bases. IMPRESSION: 1. Normal bowel gas pattern. 2. Previous cholecystectomy. 3. Bilateral pleural effusions and bilateral pulmonary opacities. Electronically  Signed   By: Signa Kell M.D.   On: 04/22/2021 13:51   MR ABDOMEN MRCP W WO CONTAST  Result Date: 04/22/2021 CLINICAL DATA:  Jaundice, elevated bilirubin, NASH cirrhosis, ETOH EXAM: MRI ABDOMEN WITHOUT AND WITH CONTRAST (INCLUDING MRCP) TECHNIQUE: Multiplanar multisequence MR imaging of the abdomen was performed both before and after the administration of intravenous contrast. Heavily T2-weighted images of the biliary and pancreatic ducts were obtained, and three-dimensional MRCP images were rendered by post processing. CONTRAST:  6.75mL GADAVIST GADOBUTROL 1 MMOL/ML IV SOLN COMPARISON:  Right upper quadrant ultrasound dated 04/06/2021. CT abdomen/pelvis dated 07/19/2020. FINDINGS: Motion degraded images. Lower chest: Small bilateral pleural effusions. Hepatobiliary: Cirrhosis. 14 mm cyst in the posterior right hepatic lobe (series 4/image 12). No suspicious/enhancing hepatic lesions. Status post cholecystectomy. Mild central intrahepatic ductal prominence (series 4/image 12), unchanged. Dilated common duct, measuring up to 17 mm (series 4/image 16), unchanged. No choledocholithiasis is seen. Pancreas: Grossly unremarkable. No peripancreatic fluid collection/pseudocyst. Spleen: Splenomegaly, measuring 15.7 cm in maximal craniocaudal dimension. Adrenals/Urinary Tract:  Adrenal glands are within normal limits. Subcentimeter left upper pole renal cyst (series 4/image 21). Right kidney is within normal limits. No hydronephrosis. Stomach/Bowel: Stomach is within normal limits. Visualized bowel is grossly unremarkable. Vascular/Lymphatic: No evidence of abdominal aortic aneurysm. 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 Perisplenic varices with splenorenal shunt. No suspicious abdominal lymphadenopathy. Other:  Small volume abdominal ascites. Body wall edema. Musculoskeletal: No focal osseous lesions. IMPRESSION: Cirrhosis.  No findings suspicious for HCC. Status post  cholecystectomy. Stable dilatation of the common duct, measuring up to 17 mm. No choledocholithiasis is seen. Consider ERCP as clinically warranted to exclude distal CBD stricture. Splenomegaly. Small volume abdominal ascites. 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 Small bilateral pleural effusions.  Body wall edema. Electronically Signed   By: Charline Bills M.D.   On: 04/22/2021 22:44    Cardiac Studies   Echo 04/21/21:  1. Left ventricular ejection fraction, by estimation, is 60 to 65%. The  left ventricle has normal function. The left ventricle has no regional  wall motion abnormalities. There is mild concentric left ventricular  hypertrophy. Left ventricular diastolic  parameters are indeterminate.   2. Right ventricular systolic function is normal. The right ventricular  size is mildly enlarged. There is moderately elevated pulmonary artery  systolic pressure.   3. Left atrial size was moderately dilated.   4. Right atrial size was mildly dilated.   5. The mitral valve is normal  in structure. Moderate mitral valve  regurgitation. No evidence of mitral stenosis.   6. Tricuspid valve regurgitation is mild to moderate.   7. The aortic valve is tricuspid. Aortic valve regurgitation is not  visualized. Mild aortic valve stenosis. Aortic valve area, by VTI measures  1.80 cm. Aortic valve mean gradient measures 15.7 mmHg. Aortic valve Vmax  measures 2.62 m/s.   8. The inferior vena cava is dilated in size with <50% respiratory  variability, suggesting right atrial pressure of 15 mmHg.   Patient Profile     56 y.o. female with a hx of cirrhosis, hypertension, IDDM, anemia, thrombocytopenia, chronic hypoxic respiratory failure who is being seen 04/22/2021 for preop evaluation for acute on chronic hypoxic respiratory failure, decompensated CHF, c/b lung dx. Diuresed well down 9.3L on this admission. Transitioning from IV lasix to oral today. Plan  originally to consider EGD with anemia, however felt to be chronic with advanced liver dx   Assessment & Plan      Acute on chronic diastolic heart failure: Echocardiogram 2/1 showed EF 60 to 65%, indeterminant diastolic function, normal RV function, moderate pulmonary hypertension (RVSP 52), mild aortic stenosis, moderate MR, RAP 15.  Likely pulmonary pathology contributing with GGO. Group II and III PHTN. Presented with dyspnea and hypervolemia, improving with diuresis. On home O2 (baseline 4L) - plan to switch to PO lasix today; lasix 40 mg daily - cont spironolactone 100 mg daily  Elevated troponin: 202 >172.  Likely demand ischemia in setting of acute on chronic diastolic heart failure and anemia.   Anemia: Hgb stable, GI plans to hold off on EGD. Thought more in the setting of advanced liver dx with marrow suppression    Cirrhosis: Has been evaluated for liver [facility] at [facility], not felt to be candidate.  On lactulose, rifaximin  Can help arrange FU with cardiology. If no changes, we will sign off; don't hesitate to reach out for further questions  For questions or updates, please contact [facility] Please consult [email or web address] for contact info under        Signed, Maisie Fus, MD  04/24/2021, 8:37 AM

## 2021-04-24 NOTE — Plan of Care (Signed)

## 2021-04-24 NOTE — Progress Notes (Signed)
Progress Note   Subjective  Chief Complaint: Cirrhosis, acute on chronic anemia  Patient lying in bed, getting blood states she is ready to go.  She has plans this Monday and coming week to go to a graduation and to [facility].  States breathing improving, on 4 L which is baseline. Had BM this AM, liquid yellow stool, no melena or hemtochezia No N, V.    Objective   Vital signs in last 24 hours: Temp:  [98.3 F (36.8 C)-99.5 F (37.5 C)] 98.3 F (36.8 C) (02/04 1152) Pulse Rate:  [77-103] 88 (02/04 1152) Resp:  [11-20] 18 (02/04 1152) BP: (132-147)/(42-67) 132/42 (02/04 1152) SpO2:  [90 %-100 %] 97 % (02/04 1152) Weight:  [58.6 kg] 58.6 kg (02/04 0338) Last BM Date: 04/24/21  General: Patient is chronically ill, juandice Lungs: anterior clear breath sounds, [place] in place Heart: RRR. Abdomen: Soft, nondistended.  No fluid wave or obvious ascites.  Tenderness is mild to moderate diffusely without guarding or rebound.  Bowel sounds normal, active..   Musc/Skeltl: No joint redness, swelling or significant deformity. Extremities: Pedal edema bilateral.  Neurologic: Pleasant, calm.  No tremors, no asterixis.  Moves all 4 limbs, formal strength testing not performed    Intake/Output from previous day: 02/03 0701 - 02/04 0700 In: 200 [P.O.:150; IV Piggyback:50] Out: 2711 [Urine:2710; Stool:1] Intake/Output this shift: Total I/O In: 480 [P.O.:480] Out: -   Lab Results: Recent Labs    04/22/21 0214 04/23/21 0234 04/24/21 0704  WBC 3.6* 2.9* 2.8*  HGB 8.2* 7.5* 7.2*  HCT 24.3* 22.8* 22.3*  PLT 44* 40* 40*   BMET Recent Labs    04/22/21 0214 04/23/21 0234 04/24/21 0704  NA 138 139 139  K 3.6 3.6 3.6  CL 99 99 97*  CO2 29 34* 34*  GLUCOSE 231* 224* 261*  BUN 21* 15 13  CREATININE 0.46 0.43* 0.39*  CALCIUM 9.1 8.7* 8.9   LFT Recent Labs    04/24/21 0704  PROT 4.7*  ALBUMIN 2.4*  AST 46*  ALT 17  ALKPHOS 74  BILITOT 19.1*   PT/INR Recent Labs     04/22/21 0214  LABPROT 31.0*  INR 3.0*    Studies/Results: DG Abd 2 Views  Result Date: 04/22/2021 CLINICAL DATA:  Abdominal pain EXAM: ABDOMEN - 2 VIEW COMPARISON:  02/25/2020 cholecystectomy clips FINDINGS: Are identified in the right upper quadrant of the abdomen. The bowel gas pattern is normal. There is no evidence of free air. No radio-opaque calculi or other significant radiographic abnormality is seen. Pleural effusions and bilateral pulmonary opacities are identified within the visualized portions of the lung bases. IMPRESSION: 1. Normal bowel gas pattern. 2. Previous cholecystectomy. 3. Bilateral pleural effusions and bilateral pulmonary opacities. Electronically Signed   By: Kerby Moors M.D.   On: 04/22/2021 13:51   MR ABDOMEN MRCP W WO CONTAST  Result Date: 04/22/2021 CLINICAL DATA:  Jaundice, elevated bilirubin, NASH cirrhosis, ETOH EXAM: MRI ABDOMEN WITHOUT AND WITH CONTRAST (INCLUDING MRCP) TECHNIQUE: Multiplanar multisequence MR imaging of the abdomen was performed both before and after the administration of intravenous contrast. Heavily T2-weighted images of the biliary and pancreatic ducts were obtained, and three-dimensional MRCP images were rendered by post processing. CONTRAST:  6.103m GADAVIST GADOBUTROL 1 MMOL/ML IV SOLN COMPARISON:  Right upper quadrant ultrasound dated 04/06/2021. CT abdomen/pelvis dated 07/19/2020. FINDINGS: Motion degraded images. Lower chest: Small bilateral pleural effusions. Hepatobiliary: Cirrhosis. 14 mm cyst in the posterior right hepatic lobe (series 4/image 12). No suspicious/enhancing  hepatic lesions. Status post cholecystectomy. Mild central intrahepatic ductal prominence (series 4/image 12), unchanged. Dilated common duct, measuring up to 17 mm (series 4/image 16), unchanged. No choledocholithiasis is seen. Pancreas: Grossly unremarkable. No peripancreatic fluid collection/pseudocyst. Spleen: Splenomegaly, measuring 15.7 cm in maximal craniocaudal  dimension. Adrenals/Urinary Tract:  Adrenal glands are within normal limits. Subcentimeter left upper pole renal cyst (series 4/image 21). Right kidney is within normal limits. No hydronephrosis. Stomach/Bowel: Stomach is within normal limits. Visualized bowel is grossly unremarkable. Vascular/Lymphatic: No evidence of abdominal aortic aneurysm. 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 Perisplenic varices with splenorenal shunt. No suspicious abdominal lymphadenopathy. Other:  Small volume abdominal ascites. Body wall edema. Musculoskeletal: No focal osseous lesions. IMPRESSION: Cirrhosis.  No findings suspicious for HCC. Status post cholecystectomy. Stable dilatation of the common duct, measuring up to 17 mm. No choledocholithiasis is seen. Consider ERCP as clinically warranted to exclude distal CBD stricture. Splenomegaly. Small volume abdominal ascites. 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 Small bilateral pleural effusions.  Body wall edema. Electronically Signed   By: Julian Hy M.D.   On: 04/22/2021 22:44      Impression/Plan:   Acute on chronic anemia HGB 7.2 (7.5) (8.2) MCV 99.1  04/13/2021 Iron 113 Ferritin 234 B12 931 No overt bleeding, HGB slight decreased from 8.2 to 7.5 and becoming more hemoconcentrated.  Slight elevation in BUN but has corrected  pancytopenia likely from cirrhosis, last retic 07/2020 normal, path smear unremarkable 04/15/21  Patient has high risk for endoscopic procedures, and without overt bleeding will not pursue EGD at this time, patient also would prefer to avoid this visit,   Appreciate cardiac input From GI perspective, will sign off with suggested outpatient follow up with primary GI- [facility]   Karlene Lineman Cirrhosis of the liver with marked elevation bilirubin AST 46 ALT 17  Alkphos 74 TBili 19.1 GFR >60  INR 04/22/2021 3.0  MELD-Na score: 30 at 04/24/2021  7:04  AM MELD score: 30 at 04/24/2021  7:04 AM Calculated from: Serum Creatinine: 0.39 mg/dL (Using min of 1 mg/dL) at 04/24/2021  7:04 AM Serum Sodium: 139 mmol/L (Using max of 137 mmol/L) at 04/24/2021  7:04 AM Total Bilirubin: 19.1 mg/dL at 04/24/2021  7:04 AM INR(ratio): 3.0 at 04/22/2021  2:14 AM Age: 56 years MRCP showed stable dilatation of the CBD so there is no convincing evidence of CBD obstruction Her elevated total bilirubin may be due to worsening liver disease Urine culture negative Follow up outpatient with primary GI- [facility]  Thrombocytopenia/Pancytopenia WBC 2.8 Platelets 40  Hypercoagulability INR 3.0- recheck today, continue to give vitamin K due to elevated INR  INR daily   Hx HE.   No signs of acute encephalopathy.  On stable doses Xifaxan, lactulose Titrate lactulose dose to 2-3 BM's a day   Elevated troponin/acute CHF   Echo 60 to 65% ventricular hypertrophy, no regional wall motion abnormalities moderately elevated pulmonary artery systolic pressure mild aortic valve stenosis, moderate mitral valve regurgitation  chest CT shows pneumonia vs edema in lower lobes as well as bilateral groundglass opacities and moderate pleural effusions. Getting diuresis now, cardiology suggest holding off on endoscopy until patient more euvolemic and cardiac status optimized-cardiology was in the room, states improving status   Chronic nausea  Longstanding.  Not on PPI.  Stable 4 times daily doses oxycodone, History of gastroparesis   Type II, IDDM.  Chronic hypoxia  On 4 L  [place] which is baseline     No future appointments.    LOS: 4 days   Vladimir Crofts  04/24/2021, 12:03 PM

## 2021-04-24 NOTE — Evaluation (Signed)
Occupational Therapy Evaluation Patient Details Name: [name] MRN: [id number] DOB: 1965/08/02 Today's Date: 04/24/2021   History of Present Illness 56 y/o female presented to ED on 04/20/21 for cough and increasing SOB. Recently hospitalized for ESBL UTI and anemia at [facility] from 1/16-1/26/23. X-ray showed patchy ground glass airspace disease, small bilateral pleural effusion concerning for pulmonary edema. PMH: NASH cirrhosis, hypertension, chronic normocytic anemia secondary to chronic illness, IDDM, chronic pancreatic insufficiency, chronic abdominal pain, chronic thrombocytopenia, chronic hypoxic respiratory failure baseline 2L now 4L   Clinical Impression   Pt admitted for concerns listed above. PTA pt reported that she was independent with all ADL's and IADL's, including caring for animals. At this time, pt appears back to baseline, requiring no assist for ADL's or functional mobility. No follow up OT recommended, acute OT will sign off.       Recommendations for follow up therapy are one component of a multi-disciplinary discharge planning process, led by the attending physician.  Recommendations may be updated based on patient status, additional functional criteria and insurance authorization.   Follow Up Recommendations  No OT follow up    Assistance Recommended at Discharge PRN  Patient can return home with the following      Functional Status Assessment  Patient has had a recent decline in their functional status and demonstrates the ability to make significant improvements in function in a reasonable and predictable amount of time.  Equipment Recommendations  None recommended by OT    Recommendations for Other Services       Precautions / Restrictions Precautions Precautions: Fall Precaution Comments: chronic on 4L O2 Restrictions Weight Bearing Restrictions: No      Mobility Bed Mobility Overal bed mobility: Modified Independent                   Transfers Overall transfer level: Modified independent Equipment used: Rolling walker (2 wheels)                      Balance Overall balance assessment: Needs assistance Sitting-balance support: Feet supported, No upper extremity supported Sitting balance-Leahy Scale: Good     Standing balance support: Bilateral upper extremity supported, Reliant on assistive device for balance Standing balance-Leahy Scale: Poor                             ADL either performed or assessed with clinical judgement   ADL Overall ADL's : Modified independent                                       General ADL Comments: No needs at thistime, pt appears near baseline.     Vision Baseline Vision/History: 0 No visual deficits Ability to See in Adequate Light: 0 Adequate Patient Visual Report: No change from baseline Vision Assessment?: No apparent visual deficits     Perception     Praxis      Pertinent Vitals/Pain Pain Assessment Pain Assessment: No/denies pain     Hand Dominance Right   Extremity/Trunk Assessment Upper Extremity Assessment Upper Extremity Assessment: Generalized weakness   Lower Extremity Assessment Lower Extremity Assessment: Defer to PT evaluation   Cervical / Trunk Assessment Cervical / Trunk Assessment: Kyphotic   Communication Communication Communication: No difficulties   Cognition Arousal/Alertness: Awake/alert Behavior During Therapy: WFL for tasks assessed/performed Overall Cognitive Status:  Within Functional Limits for tasks assessed                                       General Comments  VSS on 4L O2 [place]    Exercises     Shoulder Instructions      Home Living Family/patient expects to be discharged to:: Private residence Living Arrangements: Spouse/significant other Available Help at Discharge: Family;Available PRN/intermittently Type of Home: House Home Access: Ramped entrance      Home Layout: One level     Bathroom Shower/Tub: Teacher, early years/pre: Handicapped height Bathroom Accessibility: Yes   Home Equipment: Conservation officer, nature (2 wheels);Rollator (4 wheels);Cane - single point;Wheelchair - manual;Grab bars - toilet;Grab bars - tub/shower          Prior Functioning/Environment Prior Level of Function : Independent/Modified Independent             Mobility Comments: uses cane or rollator dependent on spot in house due to clutter          OT Problem List: Decreased strength;Decreased activity tolerance      OT Treatment/Interventions:      OT Goals(Current goals can be found in the care plan section) Acute Rehab OT Goals Patient Stated Goal: To go home OT Goal Formulation: With patient Time For Goal Achievement: 04/24/21 Potential to Achieve Goals: Good  OT Frequency:      Co-evaluation              AM-PAC OT "6 Clicks" Daily Activity     Outcome Measure Help from another person eating meals?: None Help from another person taking care of personal grooming?: None Help from another person toileting, which includes using toliet, bedpan, or urinal?: None Help from another person bathing (including washing, rinsing, drying)?: None Help from another person to put on and taking off regular upper body clothing?: None Help from another person to put on and taking off regular lower body clothing?: None 6 Click Score: 24   End of Session Equipment Utilized During Treatment: Rolling walker (2 wheels);Oxygen Nurse Communication: Mobility status  Activity Tolerance: Patient tolerated treatment well;Patient limited by fatigue Patient left: in bed;with call bell/phone within reach  OT Visit Diagnosis: Muscle weakness (generalized) (M62.81)                Time: [id number] OT Time Calculation (min): 32 min Charges:  OT General Charges $OT Visit: 1 Visit OT Evaluation $OT Eval Moderate Complexity: [place]., OTR/L Acute  Rehabilitation  Sheril Hammond Elane Yolanda Bonine 04/24/2021, 7:15 PM

## 2021-04-24 NOTE — Progress Notes (Signed)
[facility] PROGRESS NOTE   [name] [id number] DOB: 08/24/1965 DOA: 04/20/2021  4 DOS: the patient was seen and examined on 04/24/2021  [facility]: [facility]  Brief History and Hospital Course:  56 y.o. female with medical history significant of NASH cirrhosis, hypertension, chronic normocytic anemia secondary to chronic illness, IDDM, chronic pancreatic insufficiency, chronic abdominal pain, chronic thrombocytopenia, chronic hypoxic respiratory failure baseline 2 L, presented with cough and increasing shortness of breath. Patient was recently hospitalized for ESBL UTI for which patient completed 7 days of meropenem treatment, same time patient also found to have worsening of anemia and received a total of 5 unit PRBC and last hemoglobin level 7.0 on discharge at [facility].  Evaluation in the emergency department raised concern for pulmonary edema.  She was hospitalized for further management.  Gastroenterology and cardiology were consulted.  Consultants: Gastroenterology.  Phone discussion with hematology by admitting team  Procedures: None    Subjective: Patient mentions that she is feeling better.  Looking forward to going home soon.  Denies any nausea vomiting.  Shortness of breath is improved.   Assessment/Plan:   * Acute on chronic respiratory failure with hypoxia (HCC) Remains on 4 to 5 L of oxygen by nasal cannula.  Has bilateral pleural effusions.  Continue to monitor.   Respiratory status has improved.  She is on 4 L of oxygen at baseline at home.  Acute on chronic diastolic CHF (congestive heart failure) ([place])- (present on admission) Echocardiogram from 2022 showed normal systolic function.  Repeat echocardiogram shows normal systolic function without any regional wall motion abnormalities.  Left ventricular diastolic parameters were indeterminate.  Mild LVH was noted.  Mild elevated troponin likely from demand ischemia.  No chest pain is  reported.   Patient continued on IV diuretics with plans to change to oral furosemide today. Noted to be negative by about 9 L so far. Appreciate cardiology input.  Macrocytic anemia- (present on admission) Has chronic anemia.  No evidence of overt bleeding.  Required multiple units of transfusion during her recent hospitalization.  Case was discussed with hematology at the time of admission.  Low fibrinogen levels not uncommonly seen in liver disease.  Low concern for hemolytic anemia at this time. Gastroenterology was consulted. Recent B12 level was 931.  Recent anemia panel was unremarkable. Hemoglobin noted to be 7.2.  Will transfuse 1 unit of PRBC. Endoscopic evaluation was on hold due to her respiratory status.  It appears that patient wants to hold off on any endoscopies.  Thrombocytopenia ([place])- (present on admission) Secondary to liver cirrhosis.  Stable.  Chronic liver failure without hepatic coma ([place])- (present on admission) NASH cirrhosis.  Worsening bilirubin noted.  According to [facility] patient is not a candidate for liver transplant.  Continue to monitor. Continue spironolactone.  Appreciate gastroenterology input.  Patient underwent MRI abdomen with MRCP.  Dilated CBD was noted.  Defer further management to gastroenterology.  Coagulopathy ([place])- (present on admission) Secondary to liver cirrhosis.  INR is 3.2.  She was given vitamin K.  Hyperammonemia ([place])- (present on admission) Ammonia level was noted to be elevated.  Patient was given higher doses of lactulose rifaximin was continued.  Ammonia level noted to be stable this morning.  Mentation however has improved.   Diabetes mellitus without complication ([place]) YQM5H 5.8 in December.  Occasional high readings noted in glucose levels likely from dietary noncompliance.   Noted to be on insulin pump at home.  Currently off of her  pump.  Initiate glargine.  Continue SSI.  Acquired hypothyroidism- (present on  admission) Continue levothyroxine.    DVT Prophylaxis: Auto anticoagulated Code Status: DNR Family Communication: Discussed with patient.  No family at bedside Disposition Plan: Return home when improved.  Status is: Inpatient Remains inpatient appropriate because: Acute on chronic respiratory failure with hypoxia, acute diastolic CHF  Planned Discharge Destination: Home with Home Health    Medications: Scheduled:  Chlorhexidine Gluconate Cloth  6 each Topical Daily   furosemide  40 mg Oral Daily   insulin aspart  0-15 Units Subcutaneous TID WC   lactulose  30 g Oral QID   levothyroxine  50 mcg Oral QAC breakfast   lipase/protease/amylase  48,000 Units Oral TID AC   magnesium oxide  400 mg Oral Daily   pantoprazole  40 mg Oral Daily   rifaximin  550 mg Oral BID   spironolactone  100 mg Oral Daily   Vitamin D (Ergocalciferol)  50,000 Units Oral Q Wed   Continuous:  phytonadione (VITAMIN K) IV 10 mg (04/23/21 1245)   [id number], oxyCODONE  Antibiotics: Anti-infectives (From admission, onward)    Start     Dose/Rate Route Frequency Ordered Stop   04/20/21 2200  rifaximin (XIFAXAN) tablet 550 mg        550 mg Oral 2 times daily 04/20/21 1757         Objective:  Vital Signs  Vitals:   04/24/21 0000 04/24/21 0338 04/24/21 0427 04/24/21 0744  BP: (!) 140/50  (!) 136/49 (!) 147/67  Pulse: 77  85 83  Resp: 12  20 11   Temp: 98.7 F (37.1 C)  98.8 F (37.1 C) 98.9 F (37.2 C)  TempSrc: Oral  Oral Oral  SpO2: 97%  92% 95%  Weight:  58.6 kg      Intake/Output Summary (Last 24 hours) at 04/24/2021 0942 Last data filed at 04/24/2021 0500 Gross per 24 hour  Intake 200 ml  Output 2451 ml  Net -2251 ml    Filed Weights   04/24/21 0338  Weight: 58.6 kg    General appearance: Awake alert.  In no distress Resp: Improved aeration bilaterally.  Normal effort at rest.  Fewer crackles. Cardio: S1-S2 is normal regular.  No S3-S4.  No rubs murmurs or bruit GI:  Abdomen is soft.  Remains tender as before without any rebound rigidity or guarding. Extremities: Improved edema Neurologic: Alert and oriented x3.  No focal neurological deficits.      Lab Results:  Data Reviewed: I have personally reviewed labs and imaging study reports  CBC: Recent Labs  Lab 04/20/21 1857 04/21/21 0440 04/22/21 0214 04/23/21 0234 04/24/21 0704  WBC 6.7 4.9 3.6* 2.9* 2.8*  HGB 8.3* 7.7* 8.2* 7.5* 7.2*  HCT 25.8* 23.9* 24.3* 22.8* 22.3*  MCV 101.6* 100.8* 98.4 100.0 99.1  PLT 51* 47* 44* 40* 40*     Basic Metabolic Panel: Recent Labs  Lab 04/20/21 1857 04/21/21 0440 04/22/21 0214 04/23/21 0234 04/24/21 0704  NA 136 137 138 139 139  K 5.0 4.4 3.6 3.6 3.6  CL 100 100 99 99 97*  CO2 28 27 29  34* 34*  GLUCOSE 219* 262* 231* 224* 261*  BUN 39* 39* 21* 15 13  CREATININE 1.21* 0.90 0.46 0.43* 0.39*  CALCIUM 9.2 9.2 9.1 8.7* 8.9     GFR: Estimated Creatinine Clearance: 64.2 mL/min (A) (by C-G formula based on SCr of 0.39 mg/dL (L)).  Liver Function Tests: Recent Labs  Lab 04/20/21 1857 04/21/21  5277 04/22/21 0214 04/23/21 0234 04/24/21 0704  AST 36 34 40 41 46*  ALT 17 16 19 19 17   ALKPHOS 63 60 66 59 74  BILITOT 20.0* 19.5* 19.6* 19.2* 19.1*  PROT 5.2* 4.9* 4.9* 4.8* 4.7*  ALBUMIN 2.9* 2.7* 2.6* 2.5* 2.4*     Coagulation Profile: Recent Labs  Lab 04/20/21 1857 04/21/21 0440 04/22/21 0214  INR 3.2* 3.2* 3.0*     CBG: Recent Labs  Lab 04/23/21 0812 04/23/21 1140 04/23/21 1618 04/23/21 2102 04/24/21 0744  GLUCAP 168* 317* 337* 229* 240*      Radiology Studies: DG Abd 2 Views  Result Date: 04/22/2021 CLINICAL DATA:  Abdominal pain EXAM: ABDOMEN - 2 VIEW COMPARISON:  02/25/2020 cholecystectomy clips FINDINGS: Are identified in the right upper quadrant of the abdomen. The bowel gas pattern is normal. There is no evidence of free air. No radio-opaque calculi or other significant radiographic abnormality is seen. Pleural  effusions and bilateral pulmonary opacities are identified within the visualized portions of the lung bases. IMPRESSION: 1. Normal bowel gas pattern. 2. Previous cholecystectomy. 3. Bilateral pleural effusions and bilateral pulmonary opacities. Electronically Signed   By: Kerby Moors M.D.   On: 04/22/2021 13:51   MR ABDOMEN MRCP W WO CONTAST  Result Date: 04/22/2021 CLINICAL DATA:  Jaundice, elevated bilirubin, NASH cirrhosis, ETOH EXAM: MRI ABDOMEN WITHOUT AND WITH CONTRAST (INCLUDING MRCP) TECHNIQUE: Multiplanar multisequence MR imaging of the abdomen was performed both before and after the administration of intravenous contrast. Heavily T2-weighted images of the biliary and pancreatic ducts were obtained, and three-dimensional MRCP images were rendered by post processing. CONTRAST:  6.60m GADAVIST GADOBUTROL 1 MMOL/ML IV SOLN COMPARISON:  Right upper quadrant ultrasound dated 04/06/2021. CT abdomen/pelvis dated 07/19/2020. FINDINGS: Motion degraded images. Lower chest: Small bilateral pleural effusions. Hepatobiliary: Cirrhosis. 14 mm cyst in the posterior right hepatic lobe (series 4/image 12). No suspicious/enhancing hepatic lesions. Status post cholecystectomy. Mild central intrahepatic ductal prominence (series 4/image 12), unchanged. Dilated common duct, measuring up to 17 mm (series 4/image 16), unchanged. No choledocholithiasis is seen. Pancreas: Grossly unremarkable. No peripancreatic fluid collection/pseudocyst. Spleen: Splenomegaly, measuring 15.7 cm in maximal craniocaudal dimension. Adrenals/Urinary Tract:  Adrenal glands are within normal limits. Subcentimeter left upper pole renal cyst (series 4/image 21). Right kidney is within normal limits. No hydronephrosis. Stomach/Bowel: Stomach is within normal limits. Visualized bowel is grossly unremarkable. Vascular/Lymphatic: No evidence of abdominal aortic aneurysm. 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  Perisplenic varices with splenorenal shunt. No suspicious abdominal lymphadenopathy. Other:  Small volume abdominal ascites. Body wall edema. Musculoskeletal: No focal osseous lesions. IMPRESSION: Cirrhosis.  No findings suspicious for HCC. Status post cholecystectomy. Stable dilatation of the common duct, measuring up to 17 mm. No choledocholithiasis is seen. Consider ERCP as clinically warranted to exclude distal CBD stricture. Splenomegaly. Small volume abdominal ascites. 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 Small bilateral pleural effusions.  Body wall edema. Electronically Signed   By: SJulian HyM.D.   On: 04/22/2021 22:44       LOS: 4 days   GMount PleasantHospitalists Pager on [email or web address]  04/24/2021, 9:42 AM

## 2021-04-24 NOTE — Evaluation (Signed)
Physical Therapy Evaluation & Discharge  Patient Details Name: [name] MRN: [id number] DOB: 29-Jul-1965 Today's Date: 04/24/2021  History of Present Illness  56 y/o female presented to ED on 04/20/21 for cough and increasing SOB. Recently hospitalized for ESBL UTI and anemia at [facility] from 1/16-1/26/23. X-ray showed patchy ground glass airspace disease, small bilateral pleural effusion concerning for pulmonary edema. PMH: NASH cirrhosis, hypertension, chronic normocytic anemia secondary to chronic illness, IDDM, chronic pancreatic insufficiency, chronic abdominal pain, chronic thrombocytopenia, chronic hypoxic respiratory failure baseline 2L now 4L  Clinical Impression  Patient admitted with above diagnosis. Patient currently functioning at baseline which is modI with use of RW and on 4L O2 [place]. Patient reports feeling like normal and ready to return home. Recommend HHPT at discharge to maximize functional independence and safety. No further skilled PT needs identified acutely. PT will sign off.        Recommendations for follow up therapy are one component of a multi-disciplinary discharge planning process, led by the attending physician.  Recommendations may be updated based on patient status, additional functional criteria and insurance authorization.  Follow Up Recommendations Home health PT    Assistance Recommended at Discharge Set up Supervision/Assistance  Patient can return home with the following  A little help with walking and/or transfers;A little help with bathing/dressing/bathroom;Assistance with cooking/housework;Help with stairs or ramp for entrance    Equipment Recommendations None recommended by PT  Recommendations for Other Services       Functional Status Assessment       Precautions / Restrictions Precautions Precautions: Fall Precaution Comments: chronic on 4L O2 Restrictions Weight Bearing Restrictions: No      Mobility  Bed Mobility Overal bed  mobility: Modified Independent                  Transfers Overall transfer level: Modified independent Equipment used: Rolling Suleyma Wafer (2 wheels)                    Ambulation/Gait Ambulation/Gait assistance: Modified independent (Device/Increase time) Gait Distance (Feet): 30 Feet Assistive device: Rolling Christapher Gillian (2 wheels) Gait Pattern/deviations: Step-through pattern, Decreased stride length Gait velocity: decreased     General Gait Details: patient feeling at baseline for mobility  Stairs            Wheelchair Mobility    Modified Rankin (Stroke Patients Only)       Balance Overall balance assessment: Needs assistance Sitting-balance support: Feet supported, No upper extremity supported Sitting balance-Leahy Scale: Good     Standing balance support: Bilateral upper extremity supported, Reliant on assistive device for balance Standing balance-Leahy Scale: Poor                               Pertinent Vitals/Pain Pain Assessment Pain Assessment: No/denies pain    Home Living Family/patient expects to be discharged to:: Private residence Living Arrangements: Spouse/significant other Available Help at Discharge: Family;Available PRN/intermittently Type of Home: House Home Access: Ramped entrance       Home Layout: One level Home Equipment: Agricultural consultant (2 wheels);Rollator (4 wheels);Cane - single point;Wheelchair - manual;Grab bars - toilet;Grab bars - tub/shower      Prior Function Prior Level of Function : Independent/Modified Independent             Mobility Comments: uses cane or rollator dependent on spot in house due to clutter       Hand Dominance  Extremity/Trunk Assessment   Upper Extremity Assessment Upper Extremity Assessment: Defer to OT evaluation    Lower Extremity Assessment Lower Extremity Assessment: Generalized weakness    Cervical / Trunk Assessment Cervical / Trunk Assessment:  Kyphotic  Communication   Communication: No difficulties  Cognition Arousal/Alertness: Awake/alert Behavior During Therapy: WFL for tasks assessed/performed Overall Cognitive Status: Within Functional Limits for tasks assessed                                          General Comments General comments (skin integrity, edema, etc.): VSS on 4L O2 [place]    Exercises     Assessment/Plan    PT Assessment Patient does not need any further PT services  PT Problem List Decreased strength;Decreased activity tolerance;Decreased balance;Decreased mobility       PT Treatment Interventions      PT Goals (Current goals can be found in the Care Plan section)  Acute Rehab PT Goals Patient Stated Goal: return home PT Goal Formulation: With patient    Frequency       Co-evaluation               AM-PAC PT "6 Clicks" Mobility  Outcome Measure Help needed turning from your back to your side while in a flat bed without using bedrails?: None Help needed moving from lying on your back to sitting on the side of a flat bed without using bedrails?: None Help needed moving to and from a bed to a chair (including a wheelchair)?: None Help needed standing up from a chair using your arms (e.g., wheelchair or bedside chair)?: None Help needed to walk in hospital room?: None Help needed climbing 3-5 steps with a railing? : None 6 Click Score: 24    End of Session Equipment Utilized During Treatment: Oxygen Activity Tolerance: Patient tolerated treatment well Patient left: in bed;with call bell/phone within reach;with bed alarm set Nurse Communication: Mobility status PT Visit Diagnosis: Unsteadiness on feet (R26.81);Muscle weakness (generalized) (M62.81);Difficulty in walking, not elsewhere classified (R26.2)    Time: 7782-4235 PT Time Calculation (min) (ACUTE ONLY): 31 min   Charges:   PT Evaluation $PT Eval Moderate Complexity: 1 Mod          Bookert Guzzi A. Dan Humphreys PT,  DPT Acute Rehabilitation Services Pager [phone] Office [phone]   Viviann Spare 04/24/2021, 5:18 PM

## 2021-04-25 ENCOUNTER — Encounter (HOSPITAL_COMMUNITY): Payer: Self-pay | Admitting: Internal Medicine

## 2021-04-25 ENCOUNTER — Other Ambulatory Visit: Payer: Self-pay

## 2021-04-25 DIAGNOSIS — E876 Hypokalemia: Secondary | ICD-10-CM | POA: Diagnosis not present

## 2021-04-25 LAB — BPAM RBC
Blood Product Expiration Date: 202302262359
ISSUE DATE / TIME: 202302041131
Unit Type and Rh: 6200

## 2021-04-25 LAB — COMPREHENSIVE METABOLIC PANEL
ALT: 16 U/L (ref 0–44)
AST: 45 U/L — ABNORMAL HIGH (ref 15–41)
Albumin: 2.4 g/dL — ABNORMAL LOW (ref 3.5–5.0)
Alkaline Phosphatase: 78 U/L (ref 38–126)
Anion gap: 7 (ref 5–15)
BUN: 10 mg/dL (ref 6–20)
CO2: 36 mmol/L — ABNORMAL HIGH (ref 22–32)
Calcium: 8.7 mg/dL — ABNORMAL LOW (ref 8.9–10.3)
Chloride: 95 mmol/L — ABNORMAL LOW (ref 98–111)
Creatinine, Ser: 0.44 mg/dL (ref 0.44–1.00)
GFR, Estimated: >60 mL/min (ref >60)
Glucose, Bld: 98 mg/dL (ref 70–99)
Potassium: 3 mmol/L — ABNORMAL LOW (ref 3.5–5.1)
Sodium: 138 mmol/L (ref 135–145)
Total Bilirubin: 18.2 mg/dL (ref 0.3–1.2)
Total Protein: 4.9 g/dL — ABNORMAL LOW (ref 6.5–8.1)

## 2021-04-25 LAB — TYPE AND SCREEN
ABO/RH(D): A POS
Antibody Screen: NEGATIVE
Unit division: 0

## 2021-04-25 LAB — CBC
HCT: 24.1 % — ABNORMAL LOW (ref 36.0–46.0)
Hemoglobin: 8.1 g/dL — ABNORMAL LOW (ref 12.0–15.0)
MCH: 32.3 pg (ref 26.0–34.0)
MCHC: 33.6 g/dL (ref 30.0–36.0)
MCV: 96 fL (ref 80.0–100.0)
Platelets: 41 10*3/uL — ABNORMAL LOW (ref 150–400)
RBC: 2.51 MIL/uL — ABNORMAL LOW (ref 3.87–5.11)
RDW: 23.7 % — ABNORMAL HIGH (ref 11.5–15.5)
WBC: 3.8 10*3/uL — ABNORMAL LOW (ref 4.0–10.5)
nRBC: 0 % (ref 0.0–0.2)

## 2021-04-25 LAB — GLUCOSE, CAPILLARY
Glucose-Capillary: 88 mg/dL (ref 70–99)
Glucose-Capillary: 214 mg/dL — ABNORMAL HIGH (ref 70–99)
Glucose-Capillary: 155 mg/dL — ABNORMAL HIGH (ref 70–99)
Glucose-Capillary: 230 mg/dL — ABNORMAL HIGH (ref 70–99)

## 2021-04-25 MED ORDER — POTASSIUM CHLORIDE CRYS ER 20 MEQ PO TBCR
40.0000 meq | EXTENDED_RELEASE_TABLET | ORAL | Status: AC
  Administered 2021-04-25 (×2): 40 meq via ORAL
  Filled 2021-04-25 (×2): qty 2

## 2021-04-25 NOTE — Progress Notes (Addendum)
[facility] PROGRESS NOTE   [name] ECX:[id number] DOB: 06/19/1965 DOA: 04/20/2021  5 DOS: the patient was seen and examined on 04/25/2021  [facility]P: [facility]  Brief History and Hospital Course:  56 y.o. female with medical history significant of NASH cirrhosis, hypertension, chronic normocytic anemia secondary to chronic illness, IDDM, chronic pancreatic insufficiency, chronic abdominal pain, chronic thrombocytopenia, chronic hypoxic respiratory failure baseline 2 L, presented with cough and increasing shortness of breath. Patient was recently hospitalized for ESBL UTI for which patient completed 7 days of meropenem treatment, same time patient also found to have worsening of anemia and received a total of 5 unit PRBC and last hemoglobin level 7.0 on discharge at [facility].  Evaluation in the emergency department raised concern for pulmonary edema.  She was hospitalized for further management.  Gastroenterology and cardiology were consulted.  No overt bleeding was noted.  Due to her other comorbidities she was thought to be high risk for procedures.  There is no clear indication due to the same.  Gastroenterology has signed off. She was diuresed intravenously for acute diastolic CHF which is also improved.  Consultants:  Gastroenterology.   Phone discussion with hematology by admitting team.   Cardiology  Procedures: None    Subjective: Patient mentions that she is feeling well.  Worked with physical therapy yesterday.  Looking forward to going home soon.  Denies any nausea vomiting.  Shortness of breath has improved.  Assessment/Plan:   * Acute on chronic respiratory failure with hypoxia (HCC) Remains on 4 to 5 L of oxygen by nasal cannula.  Has bilateral pleural effusions.  Continue to monitor.   Respiratory status has improved.  She is on 4 L of oxygen at baseline at home.  Acute on chronic diastolic CHF (congestive heart failure) ([place])-  (present on admission) Echocardiogram from 2022 showed normal systolic function.  Repeat echocardiogram shows normal systolic function without any regional wall motion abnormalities.  Left ventricular diastolic parameters were indeterminate.  Mild LVH was noted.  Mild elevated troponin likely from demand ischemia.  No chest pain is reported.  Patient was placed on IV diuretics with good response.  Noted to be negative by 10 L.  Respiratory status has improved.  Changed over to oral furosemide.  Appreciate cardiology input.  Macrocytic anemia- (present on admission) Has chronic anemia.  No evidence of overt bleeding.  Required multiple units of transfusion during her recent hospitalization.  Case was discussed with hematology at the time of admission.  Low fibrinogen levels not uncommonly seen in liver disease.  Low concern for hemolytic anemia at this time. Gastroenterology was consulted. Recent B12 level was 931.  Recent anemia panel was unremarkable. Transfuse 1 unit of PRBC yesterday with appropriate response in hemoglobin.  We will recheck again tomorrow. Did not undergo endoscopy due to her respiratory status.  Discussed again with the gastroenterology once she was adequately diuresed.  Patient wants to hold off on further endoscopy.  Since she has not had any overt bleeding this is reasonable.  She can follow-up with her primary gastroenterologist.  Continue PPI.  Thrombocytopenia ([place])- (present on admission) Secondary to liver cirrhosis.  Stable.  Hypokalemia Likely secondary to diuretics.  Will be repleted.  Recheck tomorrow.  Check magnesium  Chronic liver failure without hepatic coma ([place])- (present on admission) NASH cirrhosis.  Worsening bilirubin noted.  According to [facility] patient is not a candidate for liver transplant.  Continue to monitor. Continue spironolactone.  Appreciate gastroenterology input.  Patient underwent MRI abdomen with MRCP.  Dilated CBD was noted.  No intervention  recommended by gastroenterology since these findings were chronic.  Coagulopathy ([place])- (present on admission) Secondary to liver cirrhosis.  INR is 3.2.  She was given vitamin K.  Acute hepatic encephalopathy- (present on admission) Ammonia level was noted to be elevated.  Patient was given higher doses of lactulose and rifaximin was continued.   Ammonia level has been stable.  Mentation has improved and seems to be close to her baseline now.    Diabetes mellitus without complication ([place]) ESP2Z 5.8 in December.  Occasional high readings noted in glucose levels likely from dietary noncompliance.   Noted to be on insulin pump at home.  Currently off of her pump.  Initiated on glargine.  Continue SSI.  Acquired hypothyroidism- (present on admission) Continue levothyroxine.    DVT Prophylaxis: Auto anticoagulated Code Status: DNR Family Communication: Discussed with patient.  No family at bedside Disposition Plan: Anticipate discharge tomorrow.  Status is: Inpatient Remains inpatient appropriate because: Acute on chronic respiratory failure with hypoxia, acute diastolic CHF  Planned Discharge Destination: Home with Home Health    Medications: Scheduled:  Chlorhexidine Gluconate Cloth  6 each Topical Daily   furosemide  40 mg Oral Daily   insulin aspart  0-20 Units Subcutaneous TID WC   insulin aspart  0-5 Units Subcutaneous QHS   insulin glargine-yfgn  10 Units Subcutaneous BID   lactulose  30 g Oral QID   levothyroxine  50 mcg Oral QAC breakfast   lipase/protease/amylase  48,000 Units Oral TID AC   magnesium oxide  400 mg Oral Daily   pantoprazole  40 mg Oral Daily   potassium chloride  40 mEq Oral Q4H   rifaximin  550 mg Oral BID   spironolactone  100 mg Oral Daily   Vitamin D (Ergocalciferol)  50,000 Units Oral Q Wed   Continuous:  RAQ:[id number], oxyCODONE  Antibiotics: Anti-infectives (From admission, onward)    Start     Dose/Rate Route Frequency Ordered Stop    04/20/21 2200  rifaximin (XIFAXAN) tablet 550 mg        550 mg Oral 2 times daily 04/20/21 1757         Objective:  Vital Signs  Vitals:   04/25/21 0500 04/25/21 0512 04/25/21 0514 04/25/21 0829  BP:  (!) 151/71 (!) 151/71 139/61  Pulse:  77 86 82  Resp:  13 11 15   Temp:  (!) 96.6 F (35.9 C) 97.8 F (36.6 C) 98.4 F (36.9 C)  TempSrc:  Oral  Oral  SpO2:  92%  91%  Weight: 57.6 kg       Intake/Output Summary (Last 24 hours) at 04/25/2021 0913 Last data filed at 04/24/2021 1620 Gross per 24 hour  Intake 1303.5 ml  Output 2200 ml  Net -896.5 ml   Filed Weights   04/24/21 0338 04/25/21 0500  Weight: 58.6 kg 57.6 kg    General appearance: Awake alert.  In no distress Resp: Normal effort at rest.  Improved aeration bilaterally.  Few crackles at the bases.  No wheezing or rhonchi. Cardio: S1-S2 is normal regular.  No S3-S4.  No rubs murmurs or bruit GI: Abdomen is soft.  Highly tender which is her baseline.  Bowel sounds present. Extremities: Mild edema.  Full range of motion of lower extremities. Neurologic: Alert and oriented x3.  No focal neurological deficits.      Lab Results:  Data Reviewed: I have personally reviewed labs and  imaging study reports  CBC: Recent Labs  Lab 04/21/21 0440 04/22/21 0214 04/23/21 0234 04/24/21 0704 04/24/21 1840 04/25/21 0223  WBC 4.9 3.6* 2.9* 2.8*  --  3.8*  HGB 7.7* 8.2* 7.5* 7.2* 9.4* 8.1*  HCT 23.9* 24.3* 22.8* 22.3* 28.0* 24.1*  MCV 100.8* 98.4 100.0 99.1  --  96.0  PLT 47* 44* 40* 40*  --  41*    Basic Metabolic Panel: Recent Labs  Lab 04/21/21 0440 04/22/21 0214 04/23/21 0234 04/24/21 0704 04/25/21 0223  NA 137 138 139 139 138  K 4.4 3.6 3.6 3.6 3.0*  CL 100 99 99 97* 95*  CO2 27 29 34* 34* 36*  GLUCOSE 262* 231* 224* 261* 98  BUN 39* 21* 15 13 10   CREATININE 0.90 0.46 0.43* 0.39* 0.44  CALCIUM 9.2 9.1 8.7* 8.9 8.7*    GFR: Estimated Creatinine Clearance: 64.2 mL/min (by C-G formula based on SCr of  0.44 mg/dL).  Liver Function Tests: Recent Labs  Lab 04/21/21 0440 04/22/21 0214 04/23/21 0234 04/24/21 0704 04/25/21 0223  AST 34 40 41 46* 45*  ALT 16 19 19 17 16   ALKPHOS 60 66 59 74 78  BILITOT 19.5* 19.6* 19.2* 19.1* 18.2*  PROT 4.9* 4.9* 4.8* 4.7* 4.9*  ALBUMIN 2.7* 2.6* 2.5* 2.4* 2.4*    Coagulation Profile: Recent Labs  Lab 04/20/21 1857 04/21/21 0440 04/22/21 0214  INR 3.2* 3.2* 3.0*    CBG: Recent Labs  Lab 04/24/21 0744 04/24/21 1112 04/24/21 1617 04/24/21 2047 04/25/21 0828  GLUCAP 240* 350* 425* 230* 88     Radiology Studies: No results found.     LOS: 5 days   Ronelle Michie [employer] on [email or web address]  04/25/2021, 9:13 AM

## 2021-04-25 NOTE — Assessment & Plan Note (Addendum)
Hypomagnesemia  This will be repleted prior to discharge.  Increase the dose of her magnesium oxide at home.  Continue home potassium at home.

## 2021-04-25 NOTE — Plan of Care (Signed)

## 2021-04-26 LAB — GLUCOSE, CAPILLARY
Glucose-Capillary: 137 mg/dL — ABNORMAL HIGH (ref 70–99)
Glucose-Capillary: 228 mg/dL — ABNORMAL HIGH (ref 70–99)

## 2021-04-26 LAB — COMPREHENSIVE METABOLIC PANEL
ALT: 18 U/L (ref 0–44)
AST: 46 U/L — ABNORMAL HIGH (ref 15–41)
Albumin: 2.2 g/dL — ABNORMAL LOW (ref 3.5–5.0)
Alkaline Phosphatase: 71 U/L (ref 38–126)
Anion gap: 5 (ref 5–15)
BUN: 13 mg/dL (ref 6–20)
CO2: 34 mmol/L — ABNORMAL HIGH (ref 22–32)
Calcium: 8.2 mg/dL — ABNORMAL LOW (ref 8.9–10.3)
Chloride: 94 mmol/L — ABNORMAL LOW (ref 98–111)
Creatinine, Ser: 0.5 mg/dL (ref 0.44–1.00)
GFR, Estimated: >60 mL/min (ref >60)
Glucose, Bld: 251 mg/dL — ABNORMAL HIGH (ref 70–99)
Potassium: 4 mmol/L (ref 3.5–5.1)
Sodium: 133 mmol/L — ABNORMAL LOW (ref 135–145)
Total Bilirubin: 17.9 mg/dL — ABNORMAL HIGH (ref 0.3–1.2)
Total Protein: 4.6 g/dL — ABNORMAL LOW (ref 6.5–8.1)

## 2021-04-26 LAB — MAGNESIUM: Magnesium: 1.2 mg/dL — ABNORMAL LOW (ref 1.7–2.4)

## 2021-04-26 LAB — HEMOGLOBIN AND HEMATOCRIT, BLOOD
HCT: 22.1 % — ABNORMAL LOW (ref 36.0–46.0)
Hemoglobin: 7.4 g/dL — ABNORMAL LOW (ref 12.0–15.0)

## 2021-04-26 MED ORDER — MAGNESIUM 400 MG PO TABS: 400.0000 mg | ORAL_TABLET | Freq: Two times a day (BID) | ORAL | 2 refills | Status: AC

## 2021-04-26 MED ORDER — FUROSEMIDE 40 MG PO TABS: 40.0000 mg | ORAL_TABLET | Freq: Every day | ORAL | 2 refills | Status: AC

## 2021-04-26 MED ORDER — MAGNESIUM OXIDE -MG SUPPLEMENT 400 (240 MG) MG PO TABS
400.0000 mg | ORAL_TABLET | Freq: Two times a day (BID) | ORAL | Status: DC
  Administered 2021-04-26: 400 mg via ORAL
  Filled 2021-04-26: qty 1

## 2021-04-26 MED ORDER — SPIRONOLACTONE 100 MG PO TABS: 100.0000 mg | ORAL_TABLET | Freq: Every day | ORAL | 2 refills | Status: AC

## 2021-04-26 MED ORDER — ONDANSETRON HCL 4 MG/2ML IJ SOLN
4.0000 mg | Freq: Four times a day (QID) | INTRAMUSCULAR | Status: DC | PRN
  Administered 2021-04-26: 4 mg via INTRAVENOUS
  Filled 2021-04-26: qty 2

## 2021-04-26 MED ORDER — MAGNESIUM SULFATE 4 GM/100ML IV SOLN
4.0000 g | Freq: Once | INTRAVENOUS | Status: AC
  Administered 2021-04-26: 4 g via INTRAVENOUS
  Filled 2021-04-26 (×2): qty 100

## 2021-04-26 NOTE — Progress Notes (Signed)
Discharge summary provided to pt with insructions. Pt verbalized understanding of instructions. Pt d/c to home with [facility] services as ordered. Pt remains alert/oriented in no apparent distress. No complaints. Per pt her significant other is responsible for her ride.

## 2021-04-26 NOTE — TOC Transition Note (Signed)
Transition of Care [facility]) - CM/SW Discharge Note   Patient Details  Name: [name] MRN: [id number] Date of Birth: 1966-01-06  [facility] CM/SW Contact:  Bess Kinds, RN Phone Number: [phone] 04/26/2021, 10:45 AM   Clinical Narrative:     Spoke with patient at the bedside to discuss post acute transition. Currently active with [facility] for [facility] PT - will need HH PT order for resumption of services. Notified [facility]. Patient stated that she will be going out of town to [place] with her family for about 10 days to celebrate her birthday, and will not be scheduling appointments during this time. [facility] notified of this. 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 Patient has transportation home. No further TOC needs identified.   Final next level of care: Home w Home Health Services Barriers to Discharge: No Barriers Identified   Patient Goals and CMS Choice Patient states their goals for this hospitalization and ongoing recovery are:: return home with family CMS Medicare.gov Compare Post Acute Care list provided to:: Patient    Discharge Placement                       Discharge Plan and Services                DME Arranged: N/A DME Agency: NA       HH Arranged: PT HH Agency: [facility] Date [facility] Agency Contacted: 04/26/21 Time HH Agency Contacted: 1045 Representative spoke with at [facility] Agency: Kandee Keen  Social Determinants of Health (SDOH) Interventions     Readmission Risk Interventions Readmission Risk Prevention Plan 04/06/2021 07/27/2020 07/20/2020  Transportation Screening Complete - Complete  PCP or Specialist Appt within 3-5 Days - Complete -  HRI or Home Care Consult Complete - Complete  Social Work Consult for Recovery Care Planning/Counseling Complete - Complete  Palliative Care Screening Not Applicable - Not Applicable  Medication Review Oceanographer) Complete - Complete  Some recent data  might be hidden

## 2021-04-26 NOTE — Discharge Summary (Signed)
[facility]  Physician Discharge Summary   Patient ID: [name] MRN: [id number] DOB/AGE: November 17, 1965 56 y.o.  Admit date: 04/20/2021 Discharge date: 04/26/2021    PCP: [facility] [place]:  Principal Problem:   Acute on chronic respiratory failure with hypoxia (HCC) Active Problems:   Acute on chronic diastolic CHF (congestive heart failure) (HCC)   Macrocytic anemia   Thrombocytopenia (HCC)   Coagulopathy (HCC)   Chronic liver failure without hepatic coma (HCC)   Hypokalemia   Acute hepatic encephalopathy   Diabetes mellitus without complication ([place])   Acquired hypothyroidism   RECOMMENDATIONS FOR OUTPATIENT FOLLOW UP: Patient instructed to follow-up with PCP for blood work to check hemoglobin and metabolic panel    Home Health: PT OT Equipment/Devices: None  CODE STATUS: DNR  DISCHARGE CONDITION: fair  Diet recommendation: As before  INITIAL HISTORY: 56 y.o. female with medical history significant of NASH cirrhosis, hypertension, chronic normocytic anemia secondary to chronic illness, IDDM, chronic pancreatic insufficiency, chronic abdominal pain, chronic thrombocytopenia, chronic hypoxic respiratory failure baseline 2 L, presented with cough and increasing shortness of breath. Patient was recently hospitalized for ESBL UTI for which patient completed 7 days of meropenem treatment, same time patient also found to have worsening of anemia and received a total of 5 unit PRBC and last hemoglobin level 7.0 on discharge at [facility].  Evaluation in the emergency department raised concern for pulmonary edema.  She was hospitalized for further management.  Gastroenterology and cardiology were consulted.  No overt bleeding was noted.  Due to her other comorbidities she was thought to be high risk for procedures.  There is no clear indication due to the same.  Gastroenterology has signed off. She was diuresed intravenously for  acute diastolic CHF which is also improved.  Consultants:  Gastroenterology.   Phone discussion with hematology by admitting team.   Cardiology   Procedures: None   HOSPITAL COURSE:   * Acute on chronic respiratory failure with hypoxia (HCC) Pleural effusion likely from CHF.  Recommend repeating chest x-ray in the outpatient setting in 1 to 2 weeks.  Respiratory status has improved.  She is on 4 L of oxygen at baseline at home.  Acute on chronic diastolic CHF (congestive heart failure) ([place])- (present on admission) Echocardiogram from 2022 showed normal systolic function.  Repeat echocardiogram shows normal systolic function without any regional wall motion abnormalities.  Left ventricular diastolic parameters were indeterminate.  Mild LVH was noted.  Mild elevated troponin likely from demand ischemia.  No chest pain is reported.  Patient was placed on IV diuretics with good response.  Noted to be negative by 10 L.  Respiratory status has improved.  Changed over to oral furosemide.  Cardiology will arrange outpatient follow-up.  Macrocytic anemia- (present on admission) Has chronic anemia.  No evidence of overt bleeding.  Required multiple units of transfusion during her recent hospitalization.  Case was discussed with hematology at the time of admission.  Low fibrinogen levels not uncommonly seen in liver disease.  Low concern for hemolytic anemia at this time. Gastroenterology was consulted. Recent B12 level was 931.  Recent anemia panel was unremarkable. She was transfused 1 unit of PRBC during this hospitalization. Did not undergo endoscopy due to her respiratory status.  Discussed again with the gastroenterology once she was adequately diuresed.  Patient wants to hold off on further endoscopy.  Since she has not had any overt bleeding this is reasonable.  She can follow-up with her  primary gastroenterologist.  Continue PPI. Patient knows to follow-up with PCP to have her hemoglobin  checked in the next few days.  Thrombocytopenia ([place])- (present on admission) Secondary to liver cirrhosis.  Stable.  Hypokalemia Hypomagnesemia  This will be repleted prior to discharge.  Increase the dose of her magnesium oxide at home.  Continue home potassium at home.  Chronic liver failure without hepatic coma ([place])- (present on admission) NASH cirrhosis.  Worsening bilirubin noted.  According to [employer] [facility] patient is not a candidate for liver transplant.  Continue to monitor. Continue spironolactone.  Appreciate gastroenterology input.  Patient underwent MRI abdomen with MRCP.  Dilated CBD was noted.  No intervention recommended by gastroenterology since these findings were chronic.  Coagulopathy ([place])- (present on admission) Secondary to liver cirrhosis.  INR is 3.2.  She was given vitamin K.  Acute hepatic encephalopathy- (present on admission) Ammonia level was noted to be elevated.  Patient was given higher doses of lactulose and rifaximin was continued.   Ammonia level has been stable.  Mentation has improved and seems to be close to her baseline now.    Diabetes mellitus without complication ([place]) BSJ6G 5.8 in December.  Occasional high readings noted in glucose levels likely from dietary noncompliance.   May resume her insulin pump at home.  Acquired hypothyroidism- (present on admission) Continue levothyroxine.   Patient is stable.  Wants to go home today.  She promises to follow-up with PCP to have her hemoglobin checked in the next few days.  Okay for discharge today.  PERTINENT LABS:  The results of significant diagnostics from this hospitalization (including imaging, microbiology, ancillary and laboratory) are listed below for reference.    Microbiology: Recent Results (from the past 240 hour(s))  Urine Culture     Status: None   Collection Time: 04/23/21 10:54 AM   Specimen: Urine, Clean Catch  Result Value Ref Range Status   Specimen Description URINE, CLEAN  CATCH  Final   Special Requests NONE  Final   Culture   Final    NO GROWTH Performed at [facility], [address], [place], [place] [address]    Report Status 04/24/2021 FINAL  Final     Labs:  COVID-19 Labs   Lab Results  Component Value Date   SARSCOV2NAA NEGATIVE 04/05/2021   [place] NEGATIVE 07/19/2020   [place] NEGATIVE 06/06/2020   [place] NEGATIVE 05/21/2020      Basic Metabolic Panel: Recent Labs  Lab 04/22/21 0214 04/23/21 0234 04/24/21 0704 04/25/21 0223 04/26/21 0319  NA 138 139 139 138 133*  K 3.6 3.6 3.6 3.0* 4.0  CL 99 99 97* 95* 94*  CO2 29 34* 34* 36* 34*  GLUCOSE 231* 224* 261* 98 251*  BUN 21* 15 13 10 13   CREATININE 0.46 0.43* 0.39* 0.44 0.50  CALCIUM 9.1 8.7* 8.9 8.7* 8.2*  MG  --   --   --   --  1.2*   Liver Function Tests: Recent Labs  Lab 04/22/21 0214 04/23/21 0234 04/24/21 0704 04/25/21 0223 04/26/21 0319  AST 40 41 46* 45* 46*  ALT 19 19 17 16 18   ALKPHOS 66 59 74 78 71  BILITOT 19.6* 19.2* 19.1* 18.2* 17.9*  PROT 4.9* 4.8* 4.7* 4.9* 4.6*  ALBUMIN 2.6* 2.5* 2.4* 2.4* 2.2*    Recent Labs  Lab 04/23/21 0234 04/24/21 0704  AMMONIA 67* 67*   CBC: Recent Labs  Lab 04/21/21 0440 04/22/21 0214 04/23/21 0234 04/24/21 0704 04/24/21 1840 04/25/21 0223 04/26/21  0319  WBC 4.9 3.6* 2.9* 2.8*  --  3.8*  --   HGB 7.7* 8.2* 7.5* 7.2* 9.4* 8.1* 7.4*  HCT 23.9* 24.3* 22.8* 22.3* 28.0* 24.1* 22.1*  MCV 100.8* 98.4 100.0 99.1  --  96.0  --   PLT 47* 44* 40* 40*  --  41*  --      CBG: Recent Labs  Lab 04/25/21 1114 04/25/21 1625 04/25/21 2040 04/26/21 0754 04/26/21 1055  GLUCAP 214* 155* 230* 137* 228*     IMAGING STUDIES CT CHEST WO CONTRAST  Result Date: 04/20/2021 CLINICAL DATA:  Pneumonia, complication suspected. EXAM: CT CHEST WITHOUT CONTRAST TECHNIQUE: Multidetector CT imaging of the chest was performed following the standard protocol without IV contrast. RADIATION DOSE REDUCTION: This  exam was performed according to the departmental dose-optimization program which includes automated exposure control, adjustment of the mA and/or kV according to patient size and/or use of iterative reconstruction technique. COMPARISON:  04/11/2021. FINDINGS: Cardiovascular: The heart is enlarged and there is no pericardial effusion. Scattered coronary artery calcifications are noted. There is atherosclerotic calcification of the aorta without evidence of aneurysm. The pulmonary trunk is normal in caliber. Mediastinum/Nodes: Shotty lymph nodes are present in the mediastinum. No axillary lymphadenopathy. Evaluation of the hila is limited due to lack of IV contrast. Surgical clips are present in the thyroid bed on the left. Hyperdense and hypodense nodules and calcifications are present in the right lobe of the thyroid gland. The trachea and esophagus are within normal limits. Lungs/Pleura: Patchy ground-glass opacities are present in the lungs bilaterally. There is consolidation in the lower lobes bilaterally. There are small to moderate bilateral pleural effusions. No pneumothorax is seen. Upper Abdomen: Cirrhotic changes are noted in the liver. A cystic lesion is present in the right lobe of the liver measuring 1.2 cm. The gallbladder is surgically absent. The spleen is enlarged. Mesenteric edema is noted and there is a small amount ascites in the right upper quadrant. Musculoskeletal: Anasarca is noted. A stable compression deformity is noted in the superior endplate of T7. No acute osseous abnormality. IMPRESSION: 1. Patchy ground-glass opacities in the lungs bilaterally with consolidation in the lower lobes, concerning for pneumonia versus edema. 2. Small to moderate bilateral pleural effusions. 3. Cardiomegaly with coronary artery calcifications. 4. Morphologic changes of cirrhosis and portal hypertension. 5. Small ascites in the right upper quadrant and anasarca. 6. Aortic atherosclerosis. Electronically  Signed   By: Brett Fairy M.D.   On: 04/20/2021 23:29   DG CHEST PORT 1 VIEW  Result Date: 04/11/2021 CLINICAL DATA:  Patient pulled  PICC out EXAM: PORTABLE CHEST 1 VIEW COMPARISON:  04/06/2021 FINDINGS: Right arm PICC tip at the cavoatrial junction unchanged from the prior study. Progression of patchy right lower lobe infiltrate. Small right pleural effusion as progressed. No change in left lower lobe infiltrate. Mild cardiac enlargement.  Negative for heart failure or edema. IMPRESSION: PICC tip in the SVC at the cavoatrial junction unchanged from prior study Progression of right lower lobe infiltrate and effusion possible pneumonia. No change left lower lobe infiltrate. Electronically Signed   By: Franchot Gallo M.D.   On: 04/11/2021 13:19   DG CHEST PORT 1 VIEW  Result Date: 04/06/2021 CLINICAL DATA:  PICC placement EXAM: PORTABLE CHEST 1 VIEW COMPARISON:  04/05/2021 FINDINGS: Interval placement of right upper extremity PICC, tip positioned near the superior cavoatrial junction. Otherwise unchanged AP portable examination with cardiomegaly and pulmonary vascular prominence as well as calcified bilateral pulmonary nodules. IMPRESSION: 1.  Interval placement of right upper extremity PICC, tip positioned near the superior cavoatrial junction. 2. Otherwise unchanged AP portable examination with cardiomegaly and pulmonary vascular prominence. Electronically Signed   By: Delanna Ahmadi M.D.   On: 04/06/2021 13:17   DG Chest Port 1 View  Result Date: 04/05/2021 CLINICAL DATA:  Possible sepsis EXAM: PORTABLE CHEST 1 VIEW COMPARISON:  03/17/2021 FINDINGS: Cardiac shadow is enlarged but stable. Increasing vascular congestion is noted without significant edema. No focal infiltrate is seen. Mild left basilar atelectasis is noted. No bony abnormality is seen. IMPRESSION: Increasing congestive failure without definitive edema. Mild left basilar atelectasis. Electronically Signed   By: Inez Catalina M.D.   On:  04/05/2021 23:13   DG Abd 2 Views  Result Date: 04/22/2021 CLINICAL DATA:  Abdominal pain EXAM: ABDOMEN - 2 VIEW COMPARISON:  02/25/2020 cholecystectomy clips FINDINGS: Are identified in the right upper quadrant of the abdomen. The bowel gas pattern is normal. There is no evidence of free air. No radio-opaque calculi or other significant radiographic abnormality is seen. Pleural effusions and bilateral pulmonary opacities are identified within the visualized portions of the lung bases. IMPRESSION: 1. Normal bowel gas pattern. 2. Previous cholecystectomy. 3. Bilateral pleural effusions and bilateral pulmonary opacities. Electronically Signed   By: Kerby Moors M.D.   On: 04/22/2021 13:51   MR ABDOMEN MRCP W WO CONTAST  Result Date: 04/22/2021 CLINICAL DATA:  Jaundice, elevated bilirubin, NASH cirrhosis, ETOH EXAM: MRI ABDOMEN WITHOUT AND WITH CONTRAST (INCLUDING MRCP) TECHNIQUE: Multiplanar multisequence MR imaging of the abdomen was performed both before and after the administration of intravenous contrast. Heavily T2-weighted images of the biliary and pancreatic ducts were obtained, and three-dimensional MRCP images were rendered by post processing. CONTRAST:  6.60m GADAVIST GADOBUTROL 1 MMOL/ML IV SOLN COMPARISON:  Right upper quadrant ultrasound dated 04/06/2021. CT abdomen/pelvis dated 07/19/2020. FINDINGS: Motion degraded images. Lower chest: Small bilateral pleural effusions. Hepatobiliary: Cirrhosis. 14 mm cyst in the posterior right hepatic lobe (series 4/image 12). No suspicious/enhancing hepatic lesions. Status post cholecystectomy. Mild central intrahepatic ductal prominence (series 4/image 12), unchanged. Dilated common duct, measuring up to 17 mm (series 4/image 16), unchanged. No choledocholithiasis is seen. Pancreas: Grossly unremarkable. No peripancreatic fluid collection/pseudocyst. Spleen: Splenomegaly, measuring 15.7 cm in maximal craniocaudal dimension. Adrenals/Urinary Tract:  Adrenal  glands are within normal limits. Subcentimeter left upper pole renal cyst (series 4/image 21). Right kidney is within normal limits. No hydronephrosis. Stomach/Bowel: Stomach is within normal limits. Visualized bowel is grossly unremarkable. Vascular/Lymphatic: No evidence of abdominal aortic aneurysm. 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 Perisplenic varices with splenorenal shunt. No suspicious abdominal lymphadenopathy. Other:  Small volume abdominal ascites. Body wall edema. Musculoskeletal: No focal osseous lesions. IMPRESSION: Cirrhosis.  No findings suspicious for HCC. Status post cholecystectomy. Stable dilatation of the common duct, measuring up to 17 mm. No choledocholithiasis is seen. Consider ERCP as clinically warranted to exclude distal CBD stricture. Splenomegaly. Small volume abdominal ascites. 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 Small bilateral pleural effusions.  Body wall edema. Electronically Signed   By: SJulian HyM.D.   On: 04/22/2021 22:44   ECHOCARDIOGRAM COMPLETE  Result Date: 04/21/2021    ECHOCARDIOGRAM REPORT   Patient Name:   DMADDISON KILNERDate of Exam: 04/21/2021 Medical Rec #:  [id number]        Height:       63.0 in Accession #:    [id number]  Weight:       144.7 lb Date of Birth:  [birth year]         BSA:          1.685 m Patient Age:    31 years          BP:           121/55 mmHg Patient Gender: F                 HR:           78 bpm. Exam Location:  Inpatient Procedure: 2D Echo, Cardiac Doppler, Color Doppler and Strain Analysis Indications:    CHF  History:        Patient has prior history of Echocardiogram examinations, most                 recent 07/25/2020. COPD, Signs/Symptoms:Chest Pain and Murmur;                 Risk Factors:Diabetes and Hypertension.  Sonographer:    [facility] Referring Phys: [id number] Lequita Halt IMPRESSIONS  1. Left ventricular ejection fraction, by  estimation, is 60 to 65%. The left ventricle has normal function. The left ventricle has no regional wall motion abnormalities. There is mild concentric left ventricular hypertrophy. Left ventricular diastolic parameters are indeterminate.  2. Right ventricular systolic function is normal. The right ventricular size is mildly enlarged. There is moderately elevated pulmonary artery systolic pressure.  3. Left atrial size was moderately dilated.  4. Right atrial size was mildly dilated.  5. The mitral valve is normal in structure. Moderate mitral valve regurgitation. No evidence of mitral stenosis.  6. Tricuspid valve regurgitation is mild to moderate.  7. The aortic valve is tricuspid. Aortic valve regurgitation is not visualized. Mild aortic valve stenosis. Aortic valve area, by VTI measures 1.80 cm. Aortic valve mean gradient measures 15.7 mmHg. Aortic valve Vmax measures 2.62 m/s.  8. The inferior vena cava is dilated in size with <50% respiratory variability, suggesting right atrial pressure of 15 mmHg. FINDINGS  Left Ventricle: Left ventricular ejection fraction, by estimation, is 60 to 65%. The left ventricle has normal function. The left ventricle has no regional wall motion abnormalities. Global longitudinal strain performed but not reported based on interpreter judgement due to suboptimal tracking. The left ventricular internal cavity size was normal in size. There is mild concentric left ventricular hypertrophy. Left ventricular diastolic function could not be evaluated due to mitral regurgitation (moderate or greater). Left ventricular diastolic parameters are indeterminate. Right Ventricle: The right ventricular size is mildly enlarged. No increase in right ventricular wall thickness. Right ventricular systolic function is normal. There is moderately elevated pulmonary artery systolic pressure. The tricuspid regurgitant velocity is 3.02 m/s, and with an assumed right atrial pressure of 15 mmHg, the  estimated right ventricular systolic pressure is 53.6 mmHg. Left Atrium: Left atrial size was moderately dilated. Right Atrium: Right atrial size was mildly dilated. Pericardium: There is no evidence of pericardial effusion. Mitral Valve: The mitral valve is normal in structure. Moderate mitral valve regurgitation. No evidence of mitral valve stenosis. Tricuspid Valve: The tricuspid valve is normal in structure. Tricuspid valve regurgitation is mild to moderate. No evidence of tricuspid stenosis. Aortic Valve: The aortic valve is tricuspid. Aortic valve regurgitation is not visualized. Mild aortic stenosis is present. Aortic valve mean gradient measures 15.7 mmHg. Aortic valve peak gradient measures 27.5 mmHg. Aortic valve area, by VTI measures 1.80 cm. Pulmonic Valve: The pulmonic  valve was normal in structure. Pulmonic valve regurgitation is not visualized. No evidence of pulmonic stenosis. Aorta: The aortic root is normal in size and structure. Venous: The inferior vena cava is dilated in size with less than 50% respiratory variability, suggesting right atrial pressure of 15 mmHg. IAS/Shunts: No atrial level shunt detected by color flow Doppler.  LEFT VENTRICLE PLAX 2D LVIDd:         5.10 cm      Diastology LVIDs:         2.80 cm      LV e' medial:    6.42 cm/s LV PW:         1.10 cm      LV E/e' medial:  21.3 LV IVS:        1.00 cm      LV e' lateral:   9.79 cm/s LVOT diam:     2.00 cm      LV E/e' lateral: 14.0 LV SV:         100 LV SV Index:   59 LVOT Area:     3.14 cm  LV Volumes (MOD) LV vol d, MOD A2C: 109.0 ml LV vol d, MOD A4C: 123.0 ml LV vol s, MOD A2C: 29.3 ml LV vol s, MOD A4C: 50.2 ml LV SV MOD A2C:     79.7 ml LV SV MOD A4C:     123.0 ml LV SV MOD BP:      82.2 ml RIGHT VENTRICLE RV Basal diam:  4.60 cm RV Mid diam:    3.50 cm RV S prime:     24.40 cm/s TAPSE (M-mode): 3.2 cm LEFT ATRIUM             Index        RIGHT ATRIUM           Index LA diam:        3.40 cm 2.02 cm/m   RA Area:     18.10  cm LA Vol (A2C):   59.9 ml 35.55 ml/m  RA Volume:   50.00 ml  29.67 ml/m LA Vol (A4C):   84.3 ml 50.03 ml/m LA Biplane Vol: 72.9 ml 43.26 ml/m  AORTIC VALVE                     PULMONIC VALVE AV Area (Vmax):    1.81 cm      PV Vmax:       1.51 m/s AV Area (Vmean):   1.78 cm      PV Vmean:      86.500 cm/s AV Area (VTI):     1.80 cm      PV VTI:        0.249 m AV Vmax:           262.33 cm/s   PV Peak grad:  9.1 mmHg AV Vmean:          184.000 cm/s  PV Mean grad:  4.0 mmHg AV VTI:            0.553 m AV Peak Grad:      27.5 mmHg AV Mean Grad:      15.7 mmHg LVOT Vmax:         151.00 cm/s LVOT Vmean:        104.300 cm/s LVOT VTI:          0.317 m LVOT/AV VTI ratio: 0.57  AORTA Ao Root diam: 2.50 cm Ao Asc diam:  2.90  cm MITRAL VALVE                 TRICUSPID VALVE MV Area (PHT): 3.06 cm      TR Peak grad:   36.5 mmHg MV Decel Time: 248 msec      TR Vmax:        302.00 cm/s MR Peak grad:   117.9 mmHg MR Mean grad:   80.0 mmHg    SHUNTS MR Vmax:        543.00 cm/s  Systemic VTI:  0.32 m MR Vmean:       421.0 cm/s   Systemic Diam: 2.00 cm MR PISA:        1.01 cm MR PISA Radius: 0.40 cm MV E velocity: 137.00 cm/s MV A velocity: 69.20 cm/s MV E/A ratio:  1.98 Kardie Tobb DO Electronically signed by Berniece Salines DO Signature Date/Time: 04/21/2021/5:27:36 PM    Final    [place] ASCITES (ABDOMEN LIMITED)  Result Date: 04/13/2021 CLINICAL DATA:  Cirrhosis, ascites, ascites check for paracentesis EXAM: LIMITED ABDOMEN ULTRASOUND FOR ASCITES TECHNIQUE: Limited ultrasound survey for ascites was performed in all four abdominal quadrants. COMPARISON:  04/09/2021 FINDINGS: Only a small amount of perihepatic ascites is identified. Minimal ascites in RIGHT upper quadrant and RIGHT gutter. Insufficient ascites for paracentesis. Nodular hepatic margins consistent with history of cirrhosis. IMPRESSION: Insufficient ascites for paracentesis. Electronically Signed   By: Lavonia Dana M.D.   On: 04/13/2021 15:21   [place] ASCITES (ABDOMEN  LIMITED)  Result Date: 04/09/2021 CLINICAL DATA:  Cirrhosis, ascites, abdominal distension, for therapeutic paracentesis EXAM: LIMITED ABDOMEN ULTRASOUND FOR ASCITES TECHNIQUE: Limited ultrasound survey for ascites was performed in all four abdominal quadrants. COMPARISON:  04/06/2021 FINDINGS: Only small volume of ascites is identified. Volume of ascites visualize is insufficient for expected therapeutic benefit from paracentesis. IMPRESSION: Insufficient ascites for therapeutic paracentesis. Electronically Signed   By: Lavonia Dana M.D.   On: 04/09/2021 14:20   [place] EKG SITE RITE  Result Date: 04/06/2021 If Site Rite image not attached, placement could not be confirmed due to current cardiac rhythm.  US Abdomen Limited RUQ (LIVER/GB)  Result Date: 04/06/2021 CLINICAL DATA:  Cholangitis.  Prior cholecystectomy. EXAM: ULTRASOUND ABDOMEN LIMITED RIGHT UPPER QUADRANT COMPARISON:  07/29/2020 FINDINGS: Gallbladder: Surgically absent Common bile duct: Diameter: 1.1 cm Liver: Nodular contour compatible with cirrhosis. Perihepatic ascites. 1.5 cm peripheral cyst in the right hepatic lobe on image 23 series 1. The portal vein is patent where visualized, but with abnormal reversal of flow (hepatofugal) shown on image 38. Cavernous transformation of the portal vein noted. Other: None. IMPRESSION: 1. Hepatic cirrhosis observed with perihepatic ascites and abnormal hepatofugal flow in the portal vein which demonstrates cavernous transformation. 2. Surgically absent gallbladder. 3. Stable dilated common bile duct at 1.1 cm in diameter. Electronically Signed   By: Van Clines M.D.   On: 04/06/2021 10:17    DISCHARGE EXAMINATION: Vitals:   04/25/21 2358 04/26/21 0424 04/26/21 0757 04/26/21 1231  BP: (!) 129/54 (!) 136/52 (!) 128/48 (!) 136/49  Pulse: 80 84 77 75  Resp: 15 14 15 11   Temp: 97.8 F (36.6 C) (!) 97.5 F (36.4 C) 98 F (36.7 C) 98.6 F (37 C)  TempSrc: Oral Oral Oral Oral  SpO2: 96% 98% 94%  98%  Weight:   57.5 kg    General appearance: Awake alert.  In no distress Resp: Clear to auscultation bilaterally.  Normal effort Cardio: S1-S2 is normal regular.  No S3-S4.  No rubs  murmurs or bruit GI: Abdomen is soft.  Nontender nondistended.  Bowel sounds are present normal.  No masses organomegaly   DISPOSITION: Home  Discharge Instructions     (HEART FAILURE PATIENTS) Call MD:  Anytime you have any of the following symptoms: 1) 3 pound weight gain in 24 hours or 5 pounds in 1 week 2) shortness of breath, with or without a dry hacking cough 3) swelling in the hands, feet or stomach 4) if you have to sleep on extra pillows at night in order to breathe.   Complete by: As directed    Call MD for:  difficulty breathing, headache or visual disturbances   Complete by: As directed    Call MD for:  extreme fatigue   Complete by: As directed    Call MD for:  persistant dizziness or light-headedness   Complete by: As directed    Call MD for:  persistant nausea and vomiting   Complete by: As directed    Call MD for:  severe uncontrolled pain   Complete by: As directed    Call MD for:  temperature >100.4   Complete by: As directed    Diet - low sodium heart healthy   Complete by: As directed    Discharge instructions   Complete by: As directed    Please be sure to have your doctor check your hemoglobin level in 3 days.  Take your medications as prescribed.  Seek attention if you feel dizzy lightheaded.  Or develop worsening shortness of breath abdominal pain nausea or vomiting.  You were cared for by a hospitalist during your hospital stay. If you have any questions about your discharge medications or the care you received while you were in the hospital after you are discharged, you can call the unit and asked to speak with the hospitalist on call if the hospitalist that took care of you is not available. Once you are discharged, your primary care physician will handle any further medical  issues. Please note that NO REFILLS for any discharge medications will be authorized once you are discharged, as it is imperative that you return to your primary care physician (or establish a relationship with a primary care physician if you do not have one) for your aftercare needs so that they can reassess your need for medications and monitor your lab values. If you do not have a primary care physician, you can call [phone] for a physician referral.   Increase activity slowly   Complete by: As directed          Allergies as of 04/26/2021       Reactions   Acetaminophen Hives, Other (See Comments)   Tolerates Fioricet   Cephalexin Itching, Swelling, Other (See Comments)   Tongue swells and makes throat itchy- can tolerate Zosyn   Ciprofloxacin Hives   Hydromorphone Hives   Sulfamethoxazole-trimethoprim Shortness Of Breath, Swelling, Other (See Comments)   Tongue swells   Tizanidine Anaphylaxis   Abilify [aripiprazole]    "I did noyt like the way this made me feel."        Medication List     TAKE these medications    albuterol (2.5 MG/3ML) 0.083% nebulizer solution Commonly known as: PROVENTIL Inhale 3 mLs (2.5 mg total) into the lungs every 6 (six) hours as needed for wheezing or shortness of breath.   albuterol 108 (90 Base) MCG/ACT inhaler Commonly known as: VENTOLIN HFA Inhale 2 puffs into the lungs every 4 (four) hours as needed  for wheezing or shortness of breath.   alendronate 70 MG tablet Commonly known as: FOSAMAX Take 70 mg by mouth once a week.   Dexcom G6 Sensor Misc Inject into the skin See admin instructions. Place a new sensor under the skin every 10 days   furosemide 40 MG tablet Commonly known as: LASIX Take 1 tablet (40 mg total) by mouth daily. What changed:  medication strength how much to take when to take this   insulin aspart 100 UNIT/ML injection Commonly known as: novoLOG Via Insulin Pump What changed:  how much to take how to  take this when to take this   lactulose 10 GM/15ML solution Commonly known as: CHRONULAC Take 45 mLs (30 g total) by mouth 3 (three) times daily. Take 30 grams (45 ml's) by mouth three to five times a day until 3 bowel movements a day are achieved, then as directed What changed: when to take this   levothyroxine 50 MCG tablet Commonly known as: SYNTHROID Take 50 mcg by mouth daily before breakfast.   lipase/protease/amylase 12000-38000 units Cpep capsule Commonly known as: CREON Take 4 capsules (48,000 Units total) by mouth 3 (three) times daily before meals.   Magnesium 400 MG Tabs Take 400 mg by mouth 2 (two) times daily.   ondansetron 4 MG disintegrating tablet Commonly known as: Zofran ODT Take 1 tablet (4 mg total) by mouth See admin instructions. Dissolve 4 mg orally one to two times a day What changed:  when to take this reasons to take this additional instructions   Oxycodone HCl 10 MG Tabs Take 10 mg by mouth every 6 (six) hours as needed (pain).   OXYGEN Inhale 2-4 L/min into the lungs continuous.   pantoprazole 40 MG tablet Commonly known as: PROTONIX Take 1 tablet (40 mg total) by mouth daily.   potassium chloride SA 20 MEQ tablet Commonly known as: KLOR-CON M Take 1 tablet (20 mEq total) by mouth 2 (two) times daily.   spironolactone 100 MG tablet Commonly known as: ALDACTONE Take 1 tablet (100 mg total) by mouth daily. What changed: Another medication with the same name was removed. Continue taking this medication, and follow the directions you see here.   Vitamin D (Ergocalciferol) 1.25 MG (50000 UNIT) Caps capsule Commonly known as: DRISDOL Take 50,000 Units by mouth every Wednesday.   Xifaxan 550 MG Tabs tablet Generic drug: rifaximin Take 550 mg by mouth 2 (two) times daily.          Follow-up Information     [facility], [facility] Follow up.   Specialty: Home Health Services Contact information: [place] [address] [phone]         Donato Heinz, MD Follow up.   Specialties: Cardiology, Radiology Why: The office should contact you within 2-3 business days to arrange follow-up. If you do not hear from them within this timeframe, please call the office. Contact information: [address] [place] [address] [place]. Schedule an appointment as soon as possible for a visit in 3 day(s).   Why: to check hemoglobin Contact information: [place] [phone]                 TOTAL DISCHARGE TIME: 35 mins  [facility] Pager on [email or web address]  04/27/2021, 1:16 PM

## 2021-04-26 NOTE — Progress Notes (Signed)
Mobility Specialist Progress Note    04/26/21 1055  Mobility  Bed Position Chair  Activity Ambulated with assistance in hallway  Level of Assistance Contact guard assist, steadying assist  Assistive Device Front wheel walker  Distance Ambulated (ft) 290 ft  Activity Response Tolerated fair  $Mobility charge 1 Mobility   Pre-Mobility: 83 HR During Mobility: 83 HR  Pt received on BSC and agreeable. Ambulated on 4LO2. No complaints. Returned to chair with call bell in reach.   [facility] Mobility Specialist  M.S. 5N: [phone]

## 2021-05-09 ENCOUNTER — Other Ambulatory Visit: Payer: Self-pay

## 2021-05-09 ENCOUNTER — Inpatient Hospital Stay (HOSPITAL_COMMUNITY)
Admission: EM | Admit: 2021-05-09 | Discharge: 2021-05-11 | DRG: 441 | Disposition: A | Discharge status: Hospice | Payer: Medicare Other | Attending: Family Medicine | Admitting: Family Medicine

## 2021-05-09 ENCOUNTER — Emergency Department (HOSPITAL_COMMUNITY): Payer: Medicare Other

## 2021-05-09 ENCOUNTER — Encounter (HOSPITAL_COMMUNITY): Payer: Self-pay | Admitting: Pharmacy Technician

## 2021-05-09 DIAGNOSIS — G9341 Metabolic encephalopathy: Secondary | ICD-10-CM

## 2021-05-09 DIAGNOSIS — D696 Thrombocytopenia, unspecified: Secondary | ICD-10-CM | POA: Diagnosis present

## 2021-05-09 DIAGNOSIS — Z9049 Acquired absence of other specified parts of digestive tract: Secondary | ICD-10-CM

## 2021-05-09 DIAGNOSIS — E890 Postprocedural hypothyroidism: Secondary | ICD-10-CM | POA: Diagnosis present

## 2021-05-09 DIAGNOSIS — Z20822 Contact with and (suspected) exposure to covid-19: Secondary | ICD-10-CM | POA: Diagnosis present

## 2021-05-09 DIAGNOSIS — K7682 Hepatic encephalopathy: Secondary | ICD-10-CM | POA: Diagnosis not present

## 2021-05-09 DIAGNOSIS — K7031 Alcoholic cirrhosis of liver with ascites: Secondary | ICD-10-CM | POA: Diagnosis present

## 2021-05-09 DIAGNOSIS — Z9981 Dependence on supplemental oxygen: Secondary | ICD-10-CM

## 2021-05-09 DIAGNOSIS — K766 Portal hypertension: Secondary | ICD-10-CM | POA: Diagnosis present

## 2021-05-09 DIAGNOSIS — K7030 Alcoholic cirrhosis of liver without ascites: Secondary | ICD-10-CM | POA: Diagnosis not present

## 2021-05-09 DIAGNOSIS — K7040 Alcoholic hepatic failure without coma: Secondary | ICD-10-CM | POA: Diagnosis present

## 2021-05-09 DIAGNOSIS — J9610 Chronic respiratory failure, unspecified whether with hypoxia or hypercapnia: Secondary | ICD-10-CM | POA: Diagnosis present

## 2021-05-09 DIAGNOSIS — D61818 Other pancytopenia: Secondary | ICD-10-CM | POA: Diagnosis present

## 2021-05-09 DIAGNOSIS — F410 Panic disorder [episodic paroxysmal anxiety] without agoraphobia: Secondary | ICD-10-CM | POA: Diagnosis present

## 2021-05-09 DIAGNOSIS — Z66 Do not resuscitate: Secondary | ICD-10-CM | POA: Diagnosis present

## 2021-05-09 DIAGNOSIS — Z79890 Hormone replacement therapy (postmenopausal): Secondary | ICD-10-CM

## 2021-05-09 DIAGNOSIS — Z7984 Long term (current) use of oral hypoglycemic drugs: Secondary | ICD-10-CM

## 2021-05-09 DIAGNOSIS — E872 Acidosis, unspecified: Secondary | ICD-10-CM | POA: Diagnosis present

## 2021-05-09 DIAGNOSIS — Z7983 Long term (current) use of bisphosphonates: Secondary | ICD-10-CM

## 2021-05-09 DIAGNOSIS — Z79899 Other long term (current) drug therapy: Secondary | ICD-10-CM

## 2021-05-09 DIAGNOSIS — D539 Nutritional anemia, unspecified: Secondary | ICD-10-CM | POA: Diagnosis present

## 2021-05-09 DIAGNOSIS — I5032 Chronic diastolic (congestive) heart failure: Secondary | ICD-10-CM | POA: Diagnosis present

## 2021-05-09 DIAGNOSIS — I6202 Nontraumatic subacute subdural hemorrhage: Secondary | ICD-10-CM | POA: Diagnosis present

## 2021-05-09 DIAGNOSIS — S065XAA Traumatic subdural hemorrhage with loss of consciousness status unknown, initial encounter: Secondary | ICD-10-CM

## 2021-05-09 DIAGNOSIS — Z794 Long term (current) use of insulin: Secondary | ICD-10-CM

## 2021-05-09 DIAGNOSIS — K219 Gastro-esophageal reflux disease without esophagitis: Secondary | ICD-10-CM | POA: Diagnosis present

## 2021-05-09 DIAGNOSIS — I110 Hypertensive heart disease with heart failure: Secondary | ICD-10-CM | POA: Diagnosis present

## 2021-05-09 DIAGNOSIS — E119 Type 2 diabetes mellitus without complications: Secondary | ICD-10-CM | POA: Diagnosis present

## 2021-05-09 DIAGNOSIS — I10 Essential (primary) hypertension: Secondary | ICD-10-CM | POA: Diagnosis present

## 2021-05-09 DIAGNOSIS — Z515 Encounter for palliative care: Secondary | ICD-10-CM

## 2021-05-09 LAB — COMPREHENSIVE METABOLIC PANEL
ALT: 29 U/L (ref 0–44)
AST: 61 U/L — ABNORMAL HIGH (ref 15–41)
Albumin: 2.6 g/dL — ABNORMAL LOW (ref 3.5–5.0)
Alkaline Phosphatase: 143 U/L — ABNORMAL HIGH (ref 38–126)
Anion gap: 9 (ref 5–15)
BUN: 16 mg/dL (ref 6–20)
CO2: 27 mmol/L (ref 22–32)
Calcium: 9.1 mg/dL (ref 8.9–10.3)
Chloride: 95 mmol/L — ABNORMAL LOW (ref 98–111)
Creatinine, Ser: 0.54 mg/dL (ref 0.44–1.00)
GFR, Estimated: >60 mL/min (ref >60)
Glucose, Bld: 299 mg/dL — ABNORMAL HIGH (ref 70–99)
Potassium: 4 mmol/L (ref 3.5–5.1)
Sodium: 131 mmol/L — ABNORMAL LOW (ref 135–145)
Total Bilirubin: 18.3 mg/dL (ref 0.3–1.2)
Total Protein: 6.4 g/dL — ABNORMAL LOW (ref 6.5–8.1)

## 2021-05-09 LAB — CBC
HCT: 27.3 % — ABNORMAL LOW (ref 36.0–46.0)
Hemoglobin: 9 g/dL — ABNORMAL LOW (ref 12.0–15.0)
MCH: 35.7 pg — ABNORMAL HIGH (ref 26.0–34.0)
MCHC: 33 g/dL (ref 30.0–36.0)
MCV: 108.3 fL — ABNORMAL HIGH (ref 80.0–100.0)
Platelets: 52 10*3/uL — ABNORMAL LOW (ref 150–400)
RBC: 2.52 MIL/uL — ABNORMAL LOW (ref 3.87–5.11)
RDW: 21 % — ABNORMAL HIGH (ref 11.5–15.5)
WBC: 3.4 10*3/uL — ABNORMAL LOW (ref 4.0–10.5)
nRBC: 0 % (ref 0.0–0.2)

## 2021-05-09 LAB — LACTIC ACID, PLASMA: Lactic Acid, Venous: 2.7 mmol/L (ref 0.5–1.9)

## 2021-05-09 LAB — GLUCOSE, CAPILLARY
Glucose-Capillary: 130 mg/dL — ABNORMAL HIGH (ref 70–99)
Glucose-Capillary: 148 mg/dL — ABNORMAL HIGH (ref 70–99)
Glucose-Capillary: 205 mg/dL — ABNORMAL HIGH (ref 70–99)

## 2021-05-09 LAB — CBG MONITORING, ED: Glucose-Capillary: 276 mg/dL — ABNORMAL HIGH (ref 70–99)

## 2021-05-09 LAB — PHOSPHORUS: Phosphorus: 3 mg/dL (ref 2.5–4.6)

## 2021-05-09 LAB — MAGNESIUM: Magnesium: 1.5 mg/dL — ABNORMAL LOW (ref 1.7–2.4)

## 2021-05-09 LAB — LIPASE, BLOOD: Lipase: 31 U/L (ref 11–51)

## 2021-05-09 LAB — ETHANOL: Alcohol, Ethyl (B): <10 mg/dL (ref <10)

## 2021-05-09 LAB — PROTIME-INR
INR: 3.2 — ABNORMAL HIGH (ref 0.8–1.2)
Prothrombin Time: 32.7 seconds — ABNORMAL HIGH (ref 11.4–15.2)

## 2021-05-09 LAB — RESP PANEL BY RT-PCR (FLU A&B, COVID) ARPGX2
Influenza A by PCR: NEGATIVE
Influenza B by PCR: NEGATIVE
SARS Coronavirus 2 by RT PCR: NEGATIVE

## 2021-05-09 LAB — AMMONIA: Ammonia: 60 umol/L — ABNORMAL HIGH (ref 9–35)

## 2021-05-09 MED ORDER — RIFAXIMIN 550 MG PO TABS
550.0000 mg | ORAL_TABLET | Freq: Two times a day (BID) | ORAL | Status: DC
  Filled 2021-05-09: qty 1

## 2021-05-09 MED ORDER — ACETAMINOPHEN 650 MG RE SUPP: 650.0000 mg | Freq: Four times a day (QID) | RECTAL | Status: DC | PRN

## 2021-05-09 MED ORDER — PANCRELIPASE (LIP-PROT-AMYL) 12000-38000 UNITS PO CPEP: 48000.0000 [IU] | ORAL_CAPSULE | Freq: Three times a day (TID) | ORAL | Status: DC

## 2021-05-09 MED ORDER — LORAZEPAM 2 MG/ML IJ SOLN
0.5000 mg | INTRAMUSCULAR | Status: DC
  Administered 2021-05-09 – 2021-05-10 (×6): 0.5 mg via INTRAVENOUS
  Filled 2021-05-09 (×6): qty 1

## 2021-05-09 MED ORDER — LACTULOSE 10 GM/15ML PO SOLN
10.0000 g | Freq: Once | ORAL | Status: DC
  Filled 2021-05-09: qty 30

## 2021-05-09 MED ORDER — ONDANSETRON HCL 4 MG/2ML IJ SOLN: 4.0000 mg | Freq: Four times a day (QID) | INTRAMUSCULAR | Status: DC | PRN

## 2021-05-09 MED ORDER — ONDANSETRON HCL 4 MG PO TABS: 4.0000 mg | ORAL_TABLET | Freq: Four times a day (QID) | ORAL | Status: DC | PRN

## 2021-05-09 MED ORDER — ZIPRASIDONE MESYLATE 20 MG IM SOLR
10.0000 mg | Freq: Once | INTRAMUSCULAR | Status: AC
  Administered 2021-05-09: 10 mg via INTRAMUSCULAR
  Filled 2021-05-09: qty 20

## 2021-05-09 MED ORDER — LACTULOSE 10 GM/15ML PO SOLN: 30.0000 g | Freq: Three times a day (TID) | ORAL | Status: DC

## 2021-05-09 MED ORDER — LORAZEPAM 2 MG/ML IJ SOLN
1.0000 mg | Freq: Once | INTRAMUSCULAR | Status: AC
  Administered 2021-05-09: 1 mg via INTRAVENOUS
  Filled 2021-05-09: qty 1

## 2021-05-09 MED ORDER — LEVOTHYROXINE SODIUM 50 MCG PO TABS
50.0000 ug | ORAL_TABLET | Freq: Every day | ORAL | Status: DC
  Filled 2021-05-09: qty 1

## 2021-05-09 MED ORDER — MAGNESIUM SULFATE 2 GM/50ML IV SOLN
2.0000 g | Freq: Once | INTRAVENOUS | Status: AC
  Administered 2021-05-09: 2 g via INTRAVENOUS
  Filled 2021-05-09: qty 50

## 2021-05-09 MED ORDER — STERILE WATER FOR INJECTION IJ SOLN
INTRAMUSCULAR | Status: AC
  Filled 2021-05-09: qty 10

## 2021-05-09 MED ORDER — IOHEXOL 300 MG/ML  SOLN
100.0000 mL | Freq: Once | INTRAMUSCULAR | Status: AC | PRN
  Administered 2021-05-09: 100 mL via INTRAVENOUS

## 2021-05-09 MED ORDER — LACTULOSE ENEMA
300.0000 mL | Freq: Once | ORAL | Status: AC
  Filled 2021-05-09: qty 300

## 2021-05-09 MED ORDER — ACETAMINOPHEN 325 MG PO TABS: 650.0000 mg | ORAL_TABLET | Freq: Four times a day (QID) | ORAL | Status: DC | PRN

## 2021-05-09 MED ORDER — SODIUM CHLORIDE 0.9 % IV SOLN: INTRAVENOUS | Status: AC

## 2021-05-09 MED ORDER — OLANZAPINE 10 MG IM SOLR
10.0000 mg | Freq: Once | INTRAMUSCULAR | Status: DC
Start: 2021-05-09 — End: 2021-05-10
  Filled 2021-05-09: qty 10

## 2021-05-09 MED ORDER — LACTULOSE ENEMA
300.0000 mL | Freq: Once | RECTAL | Status: AC
Start: 2021-05-10 — End: 2021-05-10
  Administered 2021-05-09 – 2021-05-10 (×2): 300 mL via RECTAL
  Filled 2021-05-09: qty 300

## 2021-05-09 MED ORDER — INSULIN ASPART 100 UNIT/ML IJ SOLN
0.0000 [IU] | INTRAMUSCULAR | Status: DC
  Administered 2021-05-09: 5 [IU] via SUBCUTANEOUS
  Administered 2021-05-09 – 2021-05-10 (×2): 2 [IU] via SUBCUTANEOUS

## 2021-05-09 NOTE — Assessment & Plan Note (Addendum)
Due to end stage liver disease

## 2021-05-09 NOTE — Assessment & Plan Note (Addendum)
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 Minimal effacement of the adjacent brain. 2. No acute hemorrhage or herniation. -Not on anticoagulation, but has chronic thrombocytopenia platelets in the 50s. -I talked to neurosurgery nurse practitioner on-call KOneal Deputyis small so unlikely etiology of patient's altered mental status.  [facility] in [facility] morning. Repeat CT head ordered 2/20 WITH NO CHANGE IN SIZE OF HEMATOMA.  04/27/2021: Pt transitioning to full comfort measures.  Expect death in days.

## 2021-05-09 NOTE — ED Provider Notes (Addendum)
[facility] EMERGENCY DEPARTMENT Provider Note   CSN: [id number] Arrival date & time: 05/09/21  1042     History  Chief Complaint  Patient presents with   Altered Mental Status    [name] is a 56 y.o. female.  Patient brought in by her fianc.  Reportedly confused and altered beyond her baseline.  She has a history of cirrhosis with elevated T. bili and follows with GI at [facility].  Admitted here 2 weeks ago for abdominal pain and noted to have a T. bili in the 20s.  Ultimately discharged home for continued outpatient management.  Her fianc states that she has been on a trip to [place] with her friends for a few days.  She returned last night and appeared confused.  He checked on her this morning and she had defecated on himself and appeared more confused than normal just yelling "help me help me."  No reports of vomiting no reports of fevers or cough.  Patient unable to give additional history.      Home Medications Prior to Admission medications   Medication Sig Start Date End Date Taking? Authorizing Provider  albuterol (PROVENTIL) (2.5 MG/3ML) 0.083% nebulizer solution Inhale 3 mLs (2.5 mg total) into the lungs every 6 (six) hours as needed for wheezing or shortness of breath. 04/15/21   Mariea Clonts, Courage, MD  albuterol (VENTOLIN HFA) 108 (90 Base) MCG/ACT inhaler Inhale 2 puffs into the lungs every 4 (four) hours as needed for wheezing or shortness of breath. 04/15/21   Mariea Clonts, Courage, MD  alendronate (FOSAMAX) 70 MG tablet Take 70 mg by mouth once a week. 03/11/21   [provider]  Continuous Blood Gluc Sensor (DEXCOM G6 SENSOR) MISC Inject into the skin See admin instructions. Place a new sensor under the skin every 10 days    [provider]  furosemide (LASIX) 40 MG tablet Take 1 tablet (40 mg total) by mouth daily. 04/26/21   Osvaldo Shipper, MD  insulin aspart (NOVOLOG) 100 UNIT/ML injection Via Insulin Pump Patient taking differently: Inject 3-5 Units  into the skin continuous. Via Insulin Pump 07/31/20   Johnson, Clanford L, MD  lactulose (CHRONULAC) 10 GM/15ML solution Take 45 mLs (30 g total) by mouth 3 (three) times daily. Take 30 grams (45 ml's) by mouth three to five times a day until 3 bowel movements a day are achieved, then as directed Patient taking differently: Take 30 g by mouth See admin instructions. Take 30 grams (45 ml's) by mouth three to five times a day until 3 bowel movements a day are achieved, then as directed 07/31/20   Cleora Fleet, MD  levothyroxine (SYNTHROID, LEVOTHROID) 50 MCG tablet Take 50 mcg by mouth daily before breakfast.  07/25/14   [provider]  lipase/protease/amylase (CREON) 12000-38000 units CPEP capsule Take 4 capsules (48,000 Units total) by mouth 3 (three) times daily before meals. 04/15/21   Shon Hale, MD  Magnesium 400 MG TABS Take 400 mg by mouth 2 (two) times daily. 04/26/21   Osvaldo Shipper, MD  ondansetron (ZOFRAN ODT) 4 MG disintegrating tablet Take 1 tablet (4 mg total) by mouth See admin instructions. Dissolve 4 mg orally one to two times a day Patient taking differently: Take 4 mg by mouth every 8 (eight) hours as needed for nausea or vomiting. 07/31/20   Laural Benes, Clanford L, MD  Oxycodone HCl 10 MG TABS Take 10 mg by mouth every 6 (six) hours as needed (pain).    [provider]  OXYGEN Inhale 2-4 L/min into the lungs continuous.    [provider]  pantoprazole (PROTONIX) 40 MG tablet Take 1 tablet (40 mg total) by mouth daily. 04/16/21   Shon Hale, MD  potassium chloride SA (KLOR-CON M) 20 MEQ tablet Take 1 tablet (20 mEq total) by mouth 2 (two) times daily. 04/15/21   Shon Hale, MD  spironolactone (ALDACTONE) 100 MG tablet Take 1 tablet (100 mg total) by mouth daily. 04/26/21   Osvaldo Shipper, MD  Vitamin D, Ergocalciferol, (DRISDOL) 1.25 MG (50000 UNIT) CAPS capsule Take 50,000 Units by mouth every Wednesday. 06/29/20   [provider]   XIFAXAN 550 MG TABS tablet Take 550 mg by mouth 2 (two) times daily. 05/28/20   [provider]  lisinopril (ZESTRIL) 10 MG tablet Take 10 mg by mouth daily. Patient not taking: Reported on 10/02/2019 03/29/19 10/02/19  [provider]  metFORMIN (GLUCOPHAGE) 1000 MG tablet Take 1,000 mg by mouth 2 (two) times daily with a meal.  Patient not taking: Reported on 10/02/2019  10/02/19  [provider]  metoCLOPramide (REGLAN) 10 MG tablet Take 10 mg by mouth 4 (four) times daily.  Patient not taking: Reported on 10/02/2019  10/02/19  [provider]  sertraline (ZOLOFT) 100 MG tablet Take 150 mg by mouth daily. Patient not taking: Reported on 10/02/2019 09/10/16 10/02/19  [provider]  traZODone (DESYREL) 100 MG tablet Take 1 tablet by mouth daily. Patient not taking: Reported on 10/02/2019 09/15/16 10/02/19  [provider]      Allergies    Acetaminophen, Cephalexin, Ciprofloxacin, Hydromorphone, Sulfamethoxazole-trimethoprim, Tizanidine, and Abilify [aripiprazole]    Review of Systems   Review of Systems  Unable to perform ROS: Mental status change   Physical Exam Updated Vital Signs BP (!) 100/52    Pulse 77    Temp (!) 97.2 F (36.2 C) (Axillary)    Resp 14    SpO2 93%  Physical Exam Constitutional:      General: She is not in acute distress.    Appearance: Normal appearance.  HENT:     Head: Normocephalic.     Nose: Nose normal.  Eyes:     General: Scleral icterus present.     Extraocular Movements: Extraocular movements intact.  Cardiovascular:     Rate and Rhythm: Normal rate.  Pulmonary:     Effort: Pulmonary effort is normal.  Musculoskeletal:        General: Normal range of motion.     Cervical back: Normal range of motion.  Neurological:     Mental Status: She is alert.     Comments: Patient uncooperative not following commands.  Does appear to be moving all extremities.  To any questioning she just answers "help me."     ED Results / Procedures / Treatments   Labs (all labs ordered are listed, but only abnormal results are displayed) Labs Reviewed  COMPREHENSIVE METABOLIC PANEL - Abnormal; Notable for the following components:      Result Value   Sodium 131 (*)    Chloride 95 (*)    Glucose, Bld 299 (*)    Total Protein 6.4 (*)    Albumin 2.6 (*)    AST 61 (*)    Alkaline Phosphatase 143 (*)    Total Bilirubin 18.3 (*)    All other components within normal limits  CBC - Abnormal; Notable for the following components:   WBC 3.4 (*)    RBC 2.52 (*)    Hemoglobin  9.0 (*)    HCT 27.3 (*)    MCV 108.3 (*)    MCH 35.7 (*)    RDW 21.0 (*)    Platelets 52 (*)    All other components within normal limits  PROTIME-INR - Abnormal; Notable for the following components:   Prothrombin Time 32.7 (*)    INR 3.2 (*)    All other components within normal limits  AMMONIA - Abnormal; Notable for the following components:   Ammonia 60 (*)    All other components within normal limits  LACTIC ACID, PLASMA - Abnormal; Notable for the following components:   Lactic Acid, Venous 2.7 (*)    All other components within normal limits  CBG MONITORING, ED - Abnormal; Notable for the following components:   Glucose-Capillary 276 (*)    All other components within normal limits  RESP PANEL BY RT-PCR (FLU A&B, COVID) ARPGX2  CULTURE, BLOOD (ROUTINE X 2)  CULTURE, BLOOD (ROUTINE X 2)  LIPASE, BLOOD  URINALYSIS, ROUTINE W REFLEX MICROSCOPIC    EKG EKG Interpretation  Date/Time:  Sunday May 09 2021 11:12:49 EST Ventricular Rate:  87 PR Interval:  100 QRS Duration: 100 QT Interval:  370 QTC Calculation: 446 R Axis:   83 Text Interpretation: Sinus rhythm Short PR interval Left ventricular hypertrophy Confirmed by Norman Clay ([id number]) on 05/09/2021 1:05:21 PM  Radiology DG Chest Port 1 View  Result Date: 05/09/2021 CLINICAL DATA:  Altered mental status EXAM: PORTABLE CHEST 1 VIEW COMPARISON:  Chest x-ray  04/19/2021 FINDINGS: Study is significantly limited due to uncooperative patient/positioning. Left lung appears grossly clear with likely chronic mildly prominent interstitial lung markings. Heart is enlarged. No large pneumothorax visualized. IMPRESSION: As above. Electronically Signed   By: Jannifer Hick M.D.   On: 05/09/2021 13:24    Procedures .Critical Care Performed by: Cheryll Cockayne, MD Authorized by: Cheryll Cockayne, MD   Critical care provider statement:    Critical care time (minutes):  40   Critical care time was exclusive of:  Separately billable procedures and treating other patients and teaching time   Critical care was necessary to treat or prevent imminent or life-threatening deterioration of the following conditions:  CNS failure or compromise and hepatic failure    Medications Ordered in ED Medications  OLANZapine (ZYPREXA) injection 10 mg (0 mg Intramuscular Hold 05/09/21 1532)  sterile water (preservative free) injection (has no administration in time range)  lactulose (CHRONULAC) 10 GM/15ML solution 10 g (has no administration in time range)  iohexol (OMNIPAQUE) 300 MG/ML solution 100 mL (100 mLs Intravenous Contrast Given 05/09/21 1532)  ziprasidone (GEODON) injection 10 mg (10 mg Intramuscular Given 05/09/21 1337)    ED Course/ Medical Decision Making/ A&P                           Medical Decision Making Amount and/or Complexity of Data Reviewed Labs: ordered. Radiology: ordered.  Risk Prescription drug management. Decision regarding hospitalization.   Chart review shows recent prior admission 2 weeks ago for abdominal pain and cirrhosis.  Labs show T. bili 18 white count is normal hemoglobin 9 ammonia level of 60.  PT/INR elevated at 3.2 as well.  CT scan of the brain ordered which appears unremarkable per my read no ischemic changes or hemorrhage or fracture noted.  But pending final radiologist read.  Patient ordered for lactulose, hospitalist  consulted for admission.  Obtaining the CTs were very difficult.  Patient had  to be sedated and even then had to be tied down.  I was present during CT imaging to assist.  Addendum: CT scan of the brain was read by radiology as evidence of subacute subdural hematoma noted likely greater than 2 weeks in timeframe per radiologist.  This was discussed with radiologist and myself.  No midline shift or other acute findings noted.      Final Clinical Impression(s) / ED Diagnoses Final diagnoses:  Hepatic encephalopathy    Rx / DC Orders ED Discharge Orders     None         Cheryll CockayneHong, Phylliss Strege S, MD 05/09/21 1542    Cheryll CockayneHong, Giankarlo Leamer S, MD 05/09/21 1600

## 2021-05-09 NOTE — Assessment & Plan Note (Addendum)
Lactic acid 2.7.  At this time no focus of infection identified.  No concern for sepsis.  SEPSIS RULED OUT 04/30/2021: Pt transitioning to full comfort measures.  Expect death in days.

## 2021-05-09 NOTE — ED Notes (Signed)
Attempted to give pt her lactulose. Pt would not drink.

## 2021-05-09 NOTE — Assessment & Plan Note (Addendum)
05/15/2021:  Pt transitioning to full comfort measures

## 2021-05-09 NOTE — Assessment & Plan Note (Addendum)
Acute metabolic encephalopathy, appears to be hepatic in etiology.  Significant other reports patient NOT taking lactulose over the past few days.  Ammonia level - elevated at 65. Blood alcohol level, <10.  Head CT showing 4 mm subacute to chronic hematoma in the left frontoparietal convexity- I talked to neurosurgery unlikely cause of patient's altered mental status. 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  Patient refusing oral lactulose at this time and too obtunded to swallow.  Continue lactulose enemas.  - rectal tube ordered - UDS negative. - AMMONIA CONTINUES TO RISE PRECIPITOUSLY DESPITE LACTULOSE ENEMAS.  05/04/2021: Pt transitioning to full comfort measures.  Expect death in days.

## 2021-05-09 NOTE — ED Triage Notes (Signed)
Pt bib family with reports of AMS. Pt went out of town and husband does not think she was taking her lactulose. Pt jaundiced. Pt crying "help me up" repeatedly.pt covered in feces on arrival.

## 2021-05-09 NOTE — Assessment & Plan Note (Addendum)
Chronically elevated total bilirubin of 18.  With pancytopenia, platelets of 50s, Hgb stable at 9. INR elevated at 3.2.  Pt appears very ill.  Palliative care consultation requested.  ALP elevated at 143 new, abdominal CT -stable extrahepatic biliary ductal dilatation. -Follow CMP -Mag -Repleted 05/02/2021: Pt transitioning to full comfort measures.  Expect death in days.  Unable to take anything oral.

## 2021-05-09 NOTE — Progress Notes (Signed)
Called in regards to this patients head CT after coming in with altered mental status. Ct head shows a very small left sided chronic SDH with no midline shift or mass effect. No surgical intervention is warranted at this time. Would recommend follow up head CT in the morning. This is not likely the source of her confusion because it is so minimal.

## 2021-05-09 NOTE — H&P (Addendum)
History and Physical    [name] NAT:[id number] DOB: November 29, 1965 DOA: 05/09/2021  PCP: [facility] [place]  Patient coming from: Home  I have personally briefly reviewed patient's old medical records in [facility]  Chief Complaint: AMS  HPI: [name] is a 56 y.o. female with medical history significant for alcoholic liver cirrhosis, diabetes mellitus hypertension, COPD, chronic respiratory failure, diastolic CHF. Patient was brought to the ED with reports of altered mental status.  Patient significant other was initially present at bedside.  Patient had been to [place] on a trip for a few days and returned last night.  Last night she was a bit confused, but this morning patient the confusion was worse, with patient yelling "help me help me," and had defecated on herself.  Patient's significant other does not think patient had been taking her lactulose. On my evaluation patient is awake, but continuously yelling out in a loud voice " "Get me up".  She is not answering questions.  Recent hospitalization 1/21 to 2/6-for acute on chronic respiratory failure with hypoxia secondary to pleural effusion, decompensated CHF, improved with diuresis.  She is on 4 L at baseline.  Macrocytic anemia requiring 1 unit of PRBC.  Acute hepatic encephalopathy treated with lactulose and rifaximin.  Due to worsening bilirubin underwent MRI abdomen with MRCP, findings of chronic dilated CBD no interventions recommended.  ED Course: Temperature 97.2.  O2 sats 92 to 95% on room air.  Hemoglobin stable at 9.  Platelets low at 52.  ALP elevated at 143.  Total bilirubin 18.3 consistent with prior.  Lactic acid 2.7.  Chest x-ray-Limited due to uncooperativeness, no noticeable abnormality. 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 Head CTSmall subacute to chronic subdural hematoma in the  left frontoparietal convexity. Minimal effacement of the adjacent brain. 2. No acute hemorrhage or herniation.  Oral lactulose ordered in ED, patient refused orals.  Geodone10 mg also given for imaging, without significant improvement in her mental status.  Review of Systems: Limited due to altered mental status.  Past Medical History:  Diagnosis Date   Degenerative joint disease    Diabetes mellitus without complication ([place])    Gastroesophageal reflux    Pancreatitis    Panic attack    Sleep apnea    Thrombocytopenia ([place])     Past Surgical History:  Procedure Laterality Date   CHOLECYSTECTOMY     SHOULDER SURGERY     left    tongue sx. titanium screw placed to hold tongue down     TOTAL THYROIDECTOMY       reports that she has never smoked. She has never used smokeless tobacco. She reports that she does not drink alcohol and does not use drugs.  Allergies  Allergen Reactions   Acetaminophen Hives and Other (See Comments)    Tolerates Fioricet   Cephalexin Itching, Swelling and Other (See Comments)    Tongue swells and makes throat itchy- can tolerate Zosyn   Ciprofloxacin Hives   Hydromorphone Hives   Sulfamethoxazole-Trimethoprim Shortness Of Breath, Swelling and Other (See Comments)    Tongue swells    Tizanidine Anaphylaxis   Abilify [Aripiprazole]     "I did noyt like the way this made me feel."    Unable to assess due to altered mental status.  Prior to Admission medications   Medication Sig Start Date End Date Taking? Authorizing Provider  albuterol (PROVENTIL) (2.5  MG/3ML) 0.083% nebulizer solution Inhale 3 mLs (2.5 mg total) into the lungs every 6 (six) hours as needed for wheezing or shortness of breath. 04/15/21   Denton Brick, Courage, MD  albuterol (VENTOLIN HFA) 108 (90 Base) MCG/ACT inhaler Inhale 2 puffs into the lungs every 4 (four) hours as needed for wheezing or shortness of breath. 04/15/21   [place]enton Brick, Courage, MD  alendronate (FOSAMAX) 70 MG tablet  Take 70 mg by mouth once a week. 03/11/21   [provider]  Continuous Blood Gluc Sensor (DEXCOM G6 SENSOR) MISC Inject into the skin See admin instructions. Place a new sensor under the skin every 10 days    [provider]  furosemide (LASIX) 40 MG tablet Take 1 tablet (40 mg total) by mouth daily. 04/26/21   Bonnielee Haff, MD  insulin aspart (NOVOLOG) 100 UNIT/ML injection Via Insulin Pump Patient taking differently: Inject 3-5 Units into the skin continuous. Via Insulin Pump 07/31/20   Johnson, Clanford L, MD  lactulose (CHRONULAC) 10 GM/15ML solution Take 45 mLs (30 g total) by mouth 3 (three) times daily. Take 30 grams (45 ml's) by mouth three to five times a day until 3 bowel movements a day are achieved, then as directed Patient taking differently: Take 30 g by mouth See admin instructions. Take 30 grams (45 ml's) by mouth three to five times a day until 3 bowel movements a day are achieved, then as directed 07/31/20   Murlean Iba, MD  levothyroxine (SYNTHROID, LEVOTHROID) 50 MCG tablet Take 50 mcg by mouth daily before breakfast.  07/25/14   [provider]  lipase/protease/amylase (CREON) 12000-38000 units CPEP capsule Take 4 capsules (48,000 Units total) by mouth 3 (three) times daily before meals. 04/15/21   Roxan Hockey, MD  Magnesium 400 MG TABS Take 400 mg by mouth 2 (two) times daily. 04/26/21   Bonnielee Haff, MD  ondansetron (ZOFRAN ODT) 4 MG disintegrating tablet Take 1 tablet (4 mg total) by mouth See admin instructions. Dissolve 4 mg orally one to two times a day Patient taking differently: Take 4 mg by mouth every 8 (eight) hours as needed for nausea or vomiting. 07/31/20   Wynetta Emery, Clanford L, MD  Oxycodone HCl 10 MG TABS Take 10 mg by mouth every 6 (six) hours as needed (pain).    [provider]  OXYGEN Inhale 2-4 L/min into the lungs continuous.    [provider]  pantoprazole (PROTONIX) 40 MG tablet Take 1 tablet (40 mg  total) by mouth daily. 04/16/21   Roxan Hockey, MD  potassium chloride SA (KLOR-CON M) 20 MEQ tablet Take 1 tablet (20 mEq total) by mouth 2 (two) times daily. 04/15/21   Roxan Hockey, MD  spironolactone (ALDACTONE) 100 MG tablet Take 1 tablet (100 mg total) by mouth daily. 04/26/21   Bonnielee Haff, MD  Vitamin D, Ergocalciferol, (DRISDOL) 1.25 MG (50000 UNIT) CAPS capsule Take 50,000 Units by mouth every Wednesday. 06/29/20   [provider]  XIFAXAN 550 MG TABS tablet Take 550 mg by mouth 2 (two) times daily. 05/28/20   [provider]  lisinopril (ZESTRIL) 10 MG tablet Take 10 mg by mouth daily. Patient not taking: Reported on 10/02/2019 03/29/19 10/02/19  [provider]  metFORMIN (GLUCOPHAGE) 1000 MG tablet Take 1,000 mg by mouth 2 (two) times daily with a meal.  Patient not taking: Reported on 10/02/2019  10/02/19  [provider]  metoCLOPramide (REGLAN) 10 MG tablet Take 10 mg by mouth 4 (four) times daily.  Patient not taking: Reported on 10/02/2019  10/02/19  [provider]  sertraline (ZOLOFT) 100 MG tablet Take 150 mg by mouth daily. Patient not taking: Reported on 10/02/2019 09/10/16 10/02/19  [provider]  traZODone (DESYREL) 100 MG tablet Take 1 tablet by mouth daily. Patient not taking: Reported on 10/02/2019 09/15/16 10/02/19  [provider]    Physical Exam: Limited exam due to altered mental status Vitals:   05/09/21 1330 05/09/21 1400 05/09/21 1500 05/09/21 1547  BP: (!) 100/59 (!) 100/52  133/62  Pulse: 78 77  85  Resp: 16 (!) 27 14 16   Temp:      TempSrc:      SpO2: 94% 93%  92%    Constitutional: Confused Vitals:   05/09/21 1330 05/09/21 1400 05/09/21 1500 05/09/21 1547  BP: (!) 100/59 (!) 100/52  133/62  Pulse: 78 77  85  Resp: 16 (!) 27 14 16   Temp:      TempSrc:      SpO2: 94% 93%  92%   Eyes: PERRL, lids and conjunctivae normal ENMT: Limited exam. Neck: normal, supple, no masses, no  thyromegaly Respiratory: clear to auscultation bilaterally, no wheezing, no crackles. Normal respiratory effort. No accessory muscle use.  Cardiovascular: Known cardiac murmur, regular rate and rhythm, no murmurs / rubs / gallops. No extremity edema.  Lower extremities warm. Abdomen: Limited exam, no focal findings.    Musculoskeletal: no clubbing / cyanosis. No joint deformity upper and lower extremities. Good ROM, no contractures. Skin: no rashes, lesions, ulcers. No induration Neurologic: No apparent cranial nerve abnormality, moving all extremities spontaneously. Psychiatric: Awake but confused.  Labs on Admission: I have personally reviewed following labs and imaging studies  CBC: Recent Labs  Lab 05/09/21 1125  WBC 3.4*  HGB 9.0*  HCT 27.3*  MCV 108.3*  PLT 52*   Basic Metabolic Panel: Recent Labs  Lab 05/09/21 1125  NA 131*  K 4.0  CL 95*  CO2 27  GLUCOSE 299*  BUN 16  CREATININE 0.54  CALCIUM 9.1   GFR: Estimated Creatinine Clearance: 63.4 mL/min (by C-G formula based on SCr of 0.54 mg/dL). Liver Function Tests: Recent Labs  Lab 05/09/21 1125  AST 61*  ALT 29  ALKPHOS 143*  BILITOT 18.3*  PROT 6.4*  ALBUMIN 2.6*   Recent Labs  Lab 05/09/21 1251  LIPASE 31   Recent Labs  Lab 05/09/21 1125  AMMONIA 60*   Coagulation Profile: Recent Labs  Lab 05/09/21 1125  INR 3.2*   CBG: Recent Labs  Lab 05/09/21 1121  GLUCAP 276*    Radiological Exams on Admission: CT Abdomen Pelvis W Contrast  Result Date: 05/09/2021 CLINICAL DATA:  Abdominal pain EXAM: CT ABDOMEN AND PELVIS WITH CONTRAST TECHNIQUE: Multidetector CT imaging of the abdomen and pelvis was performed using the standard protocol following bolus administration of intravenous contrast. RADIATION DOSE REDUCTION: This exam was performed according to the departmental dose-optimization program which includes automated exposure control, adjustment of the mA and/or kV according to patient size  and/or use of iterative reconstruction technique. CONTRAST:  179m OMNIPAQUE IOHEXOL 300 MG/ML  SOLN COMPARISON:  CT abdomen and pelvis 07/19/2020 FINDINGS: Lower chest: Cardiomegaly. Subsegmental atelectatic changes in the lung bases. Hepatobiliary: Diffuse nodularity of the liver with inhomogeneous parenchyma consistent with cirrhosis. Interval enlargement of a 13 mm well-defined hypodensity in the right hepatic lobe which likely represents a cyst. There is a faint ill-defined hypodensity near the dome of the right hepatic lobe measuring approximately 1  cm, not definitely seen on previous CT and no abnormality visualized in the region on recent MRI. Gallbladder is surgically absent. Extrahepatic biliary ductal dilatation similar to previous study, the common bile duct measures 16 mm in diameter. No significant intrahepatic ductal dilatation. Pancreas: Atrophic with no suspicious mass or ductal dilatation visualized. Spleen: Enlarged measuring 14.7 cm in length. Adrenals/Urinary Tract: No adrenal nodules identified. A few small renal cortical cysts visualized bilaterally. No hydronephrosis or definite enhancing renal mass identified. Urinary bladder appears grossly normal. Stomach/Bowel: No bowel obstruction, free air or pneumatosis. Stable chronic mild wall thickening throughout the small bowel with congestive appearance and accompanying hazy mesenteric stranding densities. No bowel wall edema identified. No evidence of acute appendicitis. Vascular/Lymphatic: Numerous large varices are again seen mostly in the upper mid abdomen and left upper quadrant, measuring up to 2.5 cm in diameter. Cavernous transformation of the portal vein again seen. No definite bulky lymphadenopathy identified. Reproductive: Uterus and bilateral adnexa are unremarkable. Other: Small volume ascites.  Mild body wall subcutaneous edema. Musculoskeletal: No suspicious bony lesions visualized. IMPRESSION: 1. Hepatic cirrhosis. 2. Portal  hypertension including splenomegaly and numerous large varices mostly in the upper abdomen. 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 3. Small volume ascites. 4. Stable extrahepatic biliary ductal dilatation. 5. Chronic mild wall thickening of small bowel loops and hazy densities in the mesentery, likely related to cirrhosis and portal hypertension. Electronically Signed   By: Ofilia Neas M.D.   On: 05/09/2021 15:51   DG Chest Port 1 View  Result Date: 05/09/2021 CLINICAL DATA:  Altered mental status EXAM: PORTABLE CHEST 1 VIEW COMPARISON:  Chest x-ray 04/19/2021 FINDINGS: Study is significantly limited due to uncooperative patient/positioning. Left lung appears grossly clear with likely chronic mildly prominent interstitial lung markings. Heart is enlarged. No large pneumothorax visualized. IMPRESSION: As above. Electronically Signed   By: Ofilia Neas M.D.   On: 05/09/2021 13:24    EKG: Independently reviewed.  Rhythm rate 87, QTc 446.  No significant change from prior.  Assessment/Plan Principal Problem:   Acute metabolic encephalopathy Active Problems:   Thrombocytopenia ([place])   Alcoholic cirrhosis ([place])   Essential hypertension   Lactic acidosis   Pancytopenia ([facility])   Type 2 diabetes mellitus ([facility])   Macrocytic anemia   Assessment and Plan: * Acute metabolic encephalopathy Acute metabolic encephalopathy, appears to be hepatic in etiology.  Significant other reports unlikely patient has been taking lactulose over the past few days.  Ammonia level - elevated at 60, but has had prior elevations to 100. Blood alcohol level, <10. Head CT showing 4 mm subacute to chronic hematoma in the left frontoparietal convexity- I talked to neurosurgery unlikely cause of patient's altered mental status. Abd Ct-without acute findings, shows splenomegaly, numerous large varices in the upper abdomen, stable biliary ductal dilatation. Patient refusing oral lactulose at this time. -   lactulose enema x2 for now, then resume home lactulose tomorrow - N/s 75cc/hr x 15hrs - rectal tube ordered -Check UA, UDS. -IV Ativan  0.5 mg q4h as needed  Subdural hematoma- (present on admission) CT findings- 24m Small subacute to chronic subdural hematoma in the left frontoparietal convexity. Minimal effacement of the adjacent brain. 2. No acute hemorrhage or herniation. -Not on anticoagulation, but has chronic thrombocytopenia platelets in the 50s. -I talked to neurosurgery nurse practitioner on-call KOneal Deputyis small so unlikely etiology of patient's altered mental status.  [facility] CT head imaging in the morning.  Type 2 diabetes mellitus ([facility]) Glucose 299.  A1c 5.8. - SSI- M,  q4h -Hold insulin pump for now with altered mental status  Lactic acidosis- (present on admission) Lactic acid 2.7.  At this time no focus of infection identified.  No concern for sepsis.  Does not meet SIRS criteria.  Likely due to poor liver clearance due to advanced liver cirrhosis. -Gentle hydration.  Alcoholic cirrhosis ([place])- (present on admission) Chronically elevated total bilirubin of 18.  With pancytopenia, platelets of 50s, Hgb stable at 9. ALP elevated at 143 new, abdominal CT -stable extrahepatic biliary ductal dilatation. -Trend CMP for now' -Mag -1.5, Replete -Phosphorus normal 3.  Thrombocytopenia ([place])- (present on admission) Platelets 52, stable.    DVT prophylaxis: SCDS Code Status: For now Full code-prior documentation states DNR.  I am unable to confirm this from patient.  I am also unable to reach significant other. Family Communication: None at bedside.  I called significant other listed on demographics, - no response. Disposition Plan: ~ 1-2 days, pending improvement in mental status. Consults called: None.  NeuroSurgery on the phone. Admission status:  Obs tele    Bethena Roys MD [facility]  05/09/2021,  5:29 PM

## 2021-05-10 ENCOUNTER — Inpatient Hospital Stay (HOSPITAL_COMMUNITY): Payer: Medicare Other

## 2021-05-10 DIAGNOSIS — F410 Panic disorder [episodic paroxysmal anxiety] without agoraphobia: Secondary | ICD-10-CM | POA: Diagnosis present

## 2021-05-10 DIAGNOSIS — Z79890 Hormone replacement therapy (postmenopausal): Secondary | ICD-10-CM | POA: Diagnosis not present

## 2021-05-10 DIAGNOSIS — Z66 Do not resuscitate: Secondary | ICD-10-CM

## 2021-05-10 DIAGNOSIS — I10 Essential (primary) hypertension: Secondary | ICD-10-CM

## 2021-05-10 DIAGNOSIS — Z9981 Dependence on supplemental oxygen: Secondary | ICD-10-CM | POA: Diagnosis not present

## 2021-05-10 DIAGNOSIS — K7030 Alcoholic cirrhosis of liver without ascites: Secondary | ICD-10-CM | POA: Diagnosis not present

## 2021-05-10 DIAGNOSIS — D61818 Other pancytopenia: Secondary | ICD-10-CM

## 2021-05-10 DIAGNOSIS — Z20822 Contact with and (suspected) exposure to covid-19: Secondary | ICD-10-CM | POA: Diagnosis present

## 2021-05-10 DIAGNOSIS — G9341 Metabolic encephalopathy: Secondary | ICD-10-CM | POA: Diagnosis not present

## 2021-05-10 DIAGNOSIS — Z7189 Other specified counseling: Secondary | ICD-10-CM

## 2021-05-10 DIAGNOSIS — E890 Postprocedural hypothyroidism: Secondary | ICD-10-CM | POA: Diagnosis present

## 2021-05-10 DIAGNOSIS — E119 Type 2 diabetes mellitus without complications: Secondary | ICD-10-CM | POA: Diagnosis present

## 2021-05-10 DIAGNOSIS — J9610 Chronic respiratory failure, unspecified whether with hypoxia or hypercapnia: Secondary | ICD-10-CM | POA: Diagnosis present

## 2021-05-10 DIAGNOSIS — Z515 Encounter for palliative care: Secondary | ICD-10-CM | POA: Diagnosis not present

## 2021-05-10 DIAGNOSIS — D539 Nutritional anemia, unspecified: Secondary | ICD-10-CM | POA: Diagnosis present

## 2021-05-10 DIAGNOSIS — E872 Acidosis, unspecified: Secondary | ICD-10-CM | POA: Diagnosis present

## 2021-05-10 DIAGNOSIS — I6202 Nontraumatic subacute subdural hemorrhage: Secondary | ICD-10-CM | POA: Diagnosis present

## 2021-05-10 DIAGNOSIS — Z7984 Long term (current) use of oral hypoglycemic drugs: Secondary | ICD-10-CM | POA: Diagnosis not present

## 2021-05-10 DIAGNOSIS — R400 Somnolence: Secondary | ICD-10-CM

## 2021-05-10 DIAGNOSIS — I5032 Chronic diastolic (congestive) heart failure: Secondary | ICD-10-CM | POA: Diagnosis present

## 2021-05-10 DIAGNOSIS — K766 Portal hypertension: Secondary | ICD-10-CM | POA: Diagnosis present

## 2021-05-10 DIAGNOSIS — Z7983 Long term (current) use of bisphosphonates: Secondary | ICD-10-CM | POA: Diagnosis not present

## 2021-05-10 DIAGNOSIS — D696 Thrombocytopenia, unspecified: Secondary | ICD-10-CM

## 2021-05-10 DIAGNOSIS — K7682 Hepatic encephalopathy: Secondary | ICD-10-CM | POA: Diagnosis present

## 2021-05-10 DIAGNOSIS — K7460 Unspecified cirrhosis of liver: Secondary | ICD-10-CM

## 2021-05-10 DIAGNOSIS — I110 Hypertensive heart disease with heart failure: Secondary | ICD-10-CM | POA: Diagnosis present

## 2021-05-10 DIAGNOSIS — K7031 Alcoholic cirrhosis of liver with ascites: Secondary | ICD-10-CM | POA: Diagnosis present

## 2021-05-10 DIAGNOSIS — K7040 Alcoholic hepatic failure without coma: Secondary | ICD-10-CM | POA: Diagnosis present

## 2021-05-10 DIAGNOSIS — K219 Gastro-esophageal reflux disease without esophagitis: Secondary | ICD-10-CM | POA: Diagnosis present

## 2021-05-10 DIAGNOSIS — Z79899 Other long term (current) drug therapy: Secondary | ICD-10-CM | POA: Diagnosis not present

## 2021-05-10 DIAGNOSIS — Z794 Long term (current) use of insulin: Secondary | ICD-10-CM | POA: Diagnosis not present

## 2021-05-10 DIAGNOSIS — K7290 Hepatic failure, unspecified without coma: Secondary | ICD-10-CM

## 2021-05-10 DIAGNOSIS — K7210 Chronic hepatic failure without coma: Secondary | ICD-10-CM

## 2021-05-10 LAB — CBC
HCT: 26.2 % — ABNORMAL LOW (ref 36.0–46.0)
Hemoglobin: 8.6 g/dL — ABNORMAL LOW (ref 12.0–15.0)
MCH: 36.1 pg — ABNORMAL HIGH (ref 26.0–34.0)
MCHC: 32.8 g/dL (ref 30.0–36.0)
MCV: 110.1 fL — ABNORMAL HIGH (ref 80.0–100.0)
Platelets: 54 10*3/uL — ABNORMAL LOW (ref 150–400)
RBC: 2.38 MIL/uL — ABNORMAL LOW (ref 3.87–5.11)
RDW: 20.7 % — ABNORMAL HIGH (ref 11.5–15.5)
WBC: 3.5 10*3/uL — ABNORMAL LOW (ref 4.0–10.5)
nRBC: 0 % (ref 0.0–0.2)

## 2021-05-10 LAB — URINALYSIS, ROUTINE W REFLEX MICROSCOPIC
Bacteria, UA: NONE SEEN
Bilirubin Urine: NEGATIVE
Glucose, UA: NEGATIVE mg/dL
Ketones, ur: NEGATIVE mg/dL
Nitrite: POSITIVE — AB
Protein, ur: NEGATIVE mg/dL
Specific Gravity, Urine: 1.033 — ABNORMAL HIGH (ref 1.005–1.030)
pH: 7 (ref 5.0–8.0)

## 2021-05-10 LAB — PROTIME-INR
INR: 3.2 — ABNORMAL HIGH (ref 0.8–1.2)
Prothrombin Time: 32.4 seconds — ABNORMAL HIGH (ref 11.4–15.2)

## 2021-05-10 LAB — MAGNESIUM: Magnesium: 2 mg/dL (ref 1.7–2.4)

## 2021-05-10 LAB — COMPREHENSIVE METABOLIC PANEL
ALT: 25 U/L (ref 0–44)
AST: 56 U/L — ABNORMAL HIGH (ref 15–41)
Albumin: 2.4 g/dL — ABNORMAL LOW (ref 3.5–5.0)
Alkaline Phosphatase: 104 U/L (ref 38–126)
Anion gap: 5 (ref 5–15)
BUN: 15 mg/dL (ref 6–20)
CO2: 27 mmol/L (ref 22–32)
Calcium: 8.3 mg/dL — ABNORMAL LOW (ref 8.9–10.3)
Chloride: 101 mmol/L (ref 98–111)
Creatinine, Ser: 0.42 mg/dL — ABNORMAL LOW (ref 0.44–1.00)
GFR, Estimated: >60 mL/min (ref >60)
Glucose, Bld: 127 mg/dL — ABNORMAL HIGH (ref 70–99)
Potassium: 3.7 mmol/L (ref 3.5–5.1)
Sodium: 133 mmol/L — ABNORMAL LOW (ref 135–145)
Total Bilirubin: 18.5 mg/dL (ref 0.3–1.2)
Total Protein: 5.7 g/dL — ABNORMAL LOW (ref 6.5–8.1)

## 2021-05-10 LAB — GLUCOSE, CAPILLARY
Glucose-Capillary: 107 mg/dL — ABNORMAL HIGH (ref 70–99)
Glucose-Capillary: 122 mg/dL — ABNORMAL HIGH (ref 70–99)
Glucose-Capillary: 145 mg/dL — ABNORMAL HIGH (ref 70–99)
Glucose-Capillary: 161 mg/dL — ABNORMAL HIGH (ref 70–99)
Glucose-Capillary: 153 mg/dL — ABNORMAL HIGH (ref 70–99)
Glucose-Capillary: 133 mg/dL — ABNORMAL HIGH (ref 70–99)

## 2021-05-10 LAB — RAPID URINE DRUG SCREEN, HOSP PERFORMED
Amphetamines: NOT DETECTED
Barbiturates: NOT DETECTED
Benzodiazepines: NOT DETECTED
Cocaine: NOT DETECTED
Opiates: NOT DETECTED
Tetrahydrocannabinol: NOT DETECTED

## 2021-05-10 LAB — AMMONIA: Ammonia: 65 umol/L — ABNORMAL HIGH (ref 9–35)

## 2021-05-10 MED ORDER — LACTULOSE ENEMA
300.0000 mL | Freq: Three times a day (TID) | ORAL | Status: DC
  Administered 2021-05-10 (×2): 300 mL via RECTAL
  Filled 2021-05-10 (×7): qty 300

## 2021-05-10 MED ORDER — LORAZEPAM 2 MG/ML IJ SOLN: 0.5000 mg | Freq: Four times a day (QID) | INTRAMUSCULAR | Status: DC | PRN

## 2021-05-10 MED ORDER — SODIUM CHLORIDE 0.9 % IV SOLN: INTRAVENOUS | Status: DC

## 2021-05-10 MED ORDER — ORAL CARE MOUTH RINSE
15.0000 mL | Freq: Two times a day (BID) | OROMUCOSAL | Status: DC
  Administered 2021-05-10 – 2021-05-11 (×3): 15 mL via OROMUCOSAL

## 2021-05-10 MED ORDER — IPRATROPIUM-ALBUTEROL 0.5-2.5 (3) MG/3ML IN SOLN
3.0000 mL | Freq: Once | RESPIRATORY_TRACT | Status: AC
  Administered 2021-05-10: 3 mL via RESPIRATORY_TRACT

## 2021-05-10 MED ORDER — INSULIN ASPART 100 UNIT/ML IJ SOLN
0.0000 [IU] | Freq: Three times a day (TID) | INTRAMUSCULAR | Status: DC
  Administered 2021-05-10 (×2): 3 [IU] via SUBCUTANEOUS
  Administered 2021-05-11: 2 [IU] via SUBCUTANEOUS

## 2021-05-10 NOTE — Progress Notes (Signed)
PROGRESS NOTE   [name]  ZMO:[id number] DOB: 22-Dec-1965 DOA: 05/09/2021 PCP: [facility]   Chief Complaint  Patient presents with   Altered Mental Status   Level of care: Telemetry  Brief Admission History:  56 y.o. female with medical history significant for alcoholic liver cirrhosis, diabetes mellitus hypertension, COPD, chronic respiratory failure, diastolic CHF. Patient was brought to the ED with reports of altered mental status.  Patient significant other was initially present at bedside.  Patient had been to [place] on a trip for a few days and returned last night.  Last night she was a bit confused, but this morning patient the confusion was worse, with patient yelling "help me help me," and had defecated on herself.  Patient's significant other does not think patient had been taking her lactulose. On my evaluation patient is awake, but continuously yelling out in a loud voice " "Get me up".  She is not answering questions.   Recent hospitalization 1/21 to 2/6-for acute on chronic respiratory failure with hypoxia secondary to pleural effusion, decompensated CHF, improved with diuresis.  She is on 4 L at baseline.  Macrocytic anemia requiring 1 unit of PRBC.  Acute hepatic encephalopathy treated with lactulose and rifaximin.  Due to worsening bilirubin underwent MRI abdomen with MRCP, findings of chronic dilated CBD no interventions recommended.   ED Course: Temperature 97.2.  O2 sats 92 to 95% on room air.  Hemoglobin stable at 9.  Platelets low at 52.  ALP elevated at 143.  Total bilirubin 18.3 consistent with prior.  Lactic acid 2.7.  Chest x-ray-Limited due to uncooperativeness, no noticeable abnormality. 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 Head CTSmall subacute to chronic subdural hematoma in the left frontoparietal convexity. Minimal effacement  of the adjacent brain. 2. No acute hemorrhage or herniation.   Oral lactulose ordered in ED, patient refused orals.  Geodone10 mg also given for imaging, without significant improvement in her mental status.  05/10/2021:  Pt remains completely obtunded, unable to take oral lactulose, ammonia remains elevated and unchanged despite lactulose enemas.  Changed to DNR (significant other brought in DNR paperwork).    Assessment and Plan: * Acute hepatic encephalopathy- (present on admission) Acute metabolic encephalopathy, appears to be hepatic in etiology.  Significant other reports patient NOT taking lactulose over the past few days.  Ammonia level - elevated at 65. Blood alcohol level, <10.  Head CT showing 4 mm subacute to chronic hematoma in the left frontoparietal convexity- I talked to neurosurgery unlikely cause of patient's altered mental status. 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  Patient refusing oral lactulose at this time and too obtunded to swallow.  Continue lactulose enemas.  - rectal tube ordered - UDS negative.  Subdural hematoma- (present on admission) CT findings- 27m Small subacute to chronic subdural hematoma in the left frontoparietal convexity. Minimal effacement of the adjacent brain. 2. No acute hemorrhage or herniation. -Not on anticoagulation, but has chronic thrombocytopenia platelets in the 50s. -I talked to neurosurgery nurse practitioner on-call KOneal Deputyis small so unlikely etiology of patient's altered mental status.  [facility] in the morning. Repeat CT head ordered 2/20.   Alcoholic cirrhosis ([facility]- (present on admission) Chronically elevated total bilirubin of 18.  With pancytopenia, platelets of 50s, Hgb stable at 9.  INR elevated at 3.2.  Pt appears very ill.  Palliative care consultation requested.  ALP elevated at  143 new, abdominal CT -stable extrahepatic biliary ductal dilatation. -Follow CMP -Mag -Repleted  DNR (do not resuscitate)- (present on admission) Significant other brought in her DNR paperwork and was DNR order placed 2/20.   Type 2 diabetes mellitus (HCC) Glucose 299.  A1c 5.8. - SSI- M,  q4h   Pancytopenia ([place])- (present on admission) Unchanged, following.   Lactic acidosis- (present on admission) Lactic acid 2.7.  At this time no focus of infection identified.  No concern for sepsis.  SEPSIS RULED OUT -Gentle hydration.  Essential hypertension- (present on admission) Follow BP closely.  Unable to take anything p.o. at this time.   Thrombocytopenia ([place])- (present on admission) Platelets 54, stable. Following.   DVT prophylaxis: scds Code Status: DNR  Family Communication: spouse at bedside Disposition: Status is: Inpatient Remains inpatient appropriate because: severity of illness   Consultants:  Palliative care Procedures:   Antimicrobials:    Subjective: Pt obtunded, severely encephalopathic Objective: Vitals:   05/09/21 1547 05/09/21 1600 05/09/21 1758 05/09/21 1830  BP: 133/62 (!) 167/64 (!) 138/58 (!) 138/58  Pulse: 85 91 86 86  Resp: 16 (!) 23 18 18   Temp:   97.7 F (36.5 C) 97.7 F (36.5 C)  TempSrc:   Oral Oral  SpO2: 92% 95% 97%   Weight:    51.5 kg    Intake/Output Summary (Last 24 hours) at 05/10/2021 1052 Last data filed at 05/10/2021 0500 Gross per 24 hour  Intake 654 ml  Output 200 ml  Net 454 ml   Filed Weights   05/09/21 1830  Weight: 51.5 kg   Examination:  General exam: pt appears acutely ill and chronically ill at same time. She is encephalopathic foaming at the mouth.  She is severely jaundiced.  Respiratory system: shallow breathing bilaterally. Cardiovascular system: normal S1 & S2 heard. . Gastrointestinal system: Abdomen is distended, soft. Central nervous system: obtunded/ encephalopathic. Extremities: moving all  extremities spontaneously. Skin: severely jaundiced. Psychiatry: unable to determine   Data Reviewed: I have personally reviewed following labs and imaging studies  CBC: Recent Labs  Lab 05/09/21 1125 05/10/21 0419  WBC 3.4* 3.5*  HGB 9.0* 8.6*  HCT 27.3* 26.2*  MCV 108.3* 110.1*  PLT 52* 54*    Basic Metabolic Panel: Recent Labs  Lab 05/09/21 1125 05/09/21 1251 05/10/21 0419  NA 131*  --  133*  K 4.0  --  3.7  CL 95*  --  101  CO2 27  --  27  GLUCOSE 299*  --  127*  BUN 16  --  15  CREATININE 0.54  --  0.42*  CALCIUM 9.1  --  8.3*  MG  --  1.5* 2.0  PHOS  --  3.0  --     CBG: Recent Labs  Lab 05/09/21 2024 05/09/21 2337 05/10/21 0239 05/10/21 0354 05/10/21 0702  GLUCAP 148* 130* 107* 122* 145*    Recent Results (from the past 240 hour(s))  Resp Panel by RT-PCR (Flu A&B, Covid) Nasopharyngeal Swab     Status: None   Collection Time: 05/09/21 11:46 AM   Specimen: Nasopharyngeal Swab; Nasopharyngeal(NP) swabs in vial transport medium  Result Value Ref Range Status   SARS Coronavirus 2 by RT PCR NEGATIVE NEGATIVE Final    Comment: (NOTE) SARS-CoV-2 target nucleic acids are NOT DETECTED.  The SARS-CoV-2 RNA is generally detectable in upper respiratory specimens during the acute  phase of infection. The lowest concentration of SARS-CoV-2 viral copies this assay can detect is 138 copies/mL. A negative result does not preclude SARS-Cov-2 infection and should not be used as the sole basis for treatment or other patient management decisions. A negative result may occur with  improper specimen collection/handling, submission of specimen other than nasopharyngeal swab, presence of viral mutation(s) within the areas targeted by this assay, and inadequate number of viral copies(<138 copies/mL). A negative result must be combined with clinical observations, patient history, and epidemiological information. The expected result is Negative.  Fact Sheet for  Patients:  EntrepreneurPulse.com.au  Fact Sheet for Healthcare Providers:  IncredibleEmployment.be  This test is no t yet approved or cleared by the [place] FDA and  has been authorized for detection and/or diagnosis of SARS-CoV-2 by FDA under an Emergency Use Authorization (EUA). This EUA will remain  in effect (meaning this test can be used) for the duration of the COVID-19 declaration under Section 564(b)(1) of the Act, 21 U.S.C.section 360bbb-3(b)(1), unless the authorization is terminated  or revoked sooner.       Influenza A by PCR NEGATIVE NEGATIVE Final   Influenza B by PCR NEGATIVE NEGATIVE Final    Comment: (NOTE) The Xpert Xpress SARS-CoV-2/FLU/RSV plus assay is intended as an aid in the diagnosis of influenza from Nasopharyngeal swab specimens and should not be used as a sole basis for treatment. Nasal washings and aspirates are unacceptable for Xpert Xpress SARS-CoV-2/FLU/RSV testing.  Fact Sheet for Patients: EntrepreneurPulse.com.au  Fact Sheet for Healthcare Providers: IncredibleEmployment.be  This test is not yet approved or cleared by the [place] FDA and has been authorized for detection and/or diagnosis of SARS-CoV-2 by FDA under an Emergency Use Authorization (EUA). This EUA will remain in effect (meaning this test can be used) for the duration of the COVID-19 declaration under Section 564(b)(1) of the Act, 21 U.S.C. section 360bbb-3(b)(1), unless the authorization is terminated or revoked.  Performed at [facility], [address], [place], [place] [address]   Culture, blood (routine x 2)     Status: None (Preliminary result)   Collection Time: 05/09/21 12:51 PM   Specimen: BLOOD RIGHT HAND  Result Value Ref Range Status   Specimen Description BLOOD RIGHT HAND BOTTLES DRAWN AEROBIC ONLY  Final   Special Requests   Final    Blood Culture adequate volume Performed at  [facility], [address] [place], [place] [address]    Culture PENDING  Incomplete   Report Status PENDING  Incomplete  Culture, blood (routine x 2)     Status: None (Preliminary result)   Collection Time: 05/09/21 12:51 PM   Specimen: Blood  Result Value Ref Range Status   Specimen Description   Final    BLOOD RIGHT WRIST BOTTLES DRAWN AEROBIC AND ANAEROBIC   Special Requests   Final    Blood Culture adequate volume Performed at [facility], [address], [place], [place] [address]    Culture PENDING  Incomplete   Report Status PENDING  Incomplete     Radiology Studies: CT Head Wo Contrast  Result Date: 05/09/2021 CLINICAL DATA:  Altered mental status EXAM: CT HEAD WITHOUT CONTRAST TECHNIQUE: Contiguous axial images were obtained from the base of the skull through the vertex without intravenous contrast. RADIATION DOSE REDUCTION: This exam was performed according to the departmental dose-optimization program which includes automated exposure control, adjustment of the mA and/or kV according to patient size and/or use of iterative reconstruction technique. COMPARISON:  CT  head 03/16/2021 FINDINGS: Somewhat limited due to patient positioning and condition. Brain: No acute intraparenchymal hematoma. There is a low-density extra-axial likely subacute to chronic subdural hematoma on the left which measures up to 4 mm in thickness in the frontal convexity. Minimal mass effect on the adjacent brain. No evidence of herniation. No hydrocephalus. Vascular: No hyperdense vessel or unexpected calcification. Skull: Normal. Negative for fracture or focal lesion. Sinuses/Orbits: No acute finding. Other: None. IMPRESSION: 1. Small subacute to chronic subdural hematoma in the left frontoparietal convexity. Minimal effacement of the adjacent brain. 2. No acute hemorrhage or herniation. Findings discussed with Dr. Almyra Free over the telephone at 3:57 p.m. on 05/09/2021 with read back. Electronically Signed   By:  Ofilia Neas M.D.   On: 05/09/2021 15:59   CT Abdomen Pelvis W Contrast  Result Date: 05/09/2021 CLINICAL DATA:  Abdominal pain EXAM: CT ABDOMEN AND PELVIS WITH CONTRAST TECHNIQUE: Multidetector CT imaging of the abdomen and pelvis was performed using the standard protocol following bolus administration of intravenous contrast. RADIATION DOSE REDUCTION: This exam was performed according to the departmental dose-optimization program which includes automated exposure control, adjustment of the mA and/or kV according to patient size and/or use of iterative reconstruction technique. CONTRAST:  183m OMNIPAQUE IOHEXOL 300 MG/ML  SOLN COMPARISON:  CT abdomen and pelvis 07/19/2020 FINDINGS: Lower chest: Cardiomegaly. Subsegmental atelectatic changes in the lung bases. Hepatobiliary: Diffuse nodularity of the liver with inhomogeneous parenchyma consistent with cirrhosis. Interval enlargement of a 13 mm well-defined hypodensity in the right hepatic lobe which likely represents a cyst. There is a faint ill-defined hypodensity near the dome of the right hepatic lobe measuring approximately 1 cm, not definitely seen on previous CT and no abnormality visualized in the region on recent MRI. Gallbladder is surgically absent. Extrahepatic biliary ductal dilatation similar to previous study, the common bile duct measures 16 mm in diameter. No significant intrahepatic ductal dilatation. Pancreas: Atrophic with no suspicious mass or ductal dilatation visualized. Spleen: Enlarged measuring 14.7 cm in length. Adrenals/Urinary Tract: No adrenal nodules identified. A few small renal cortical cysts visualized bilaterally. No hydronephrosis or definite enhancing renal mass identified. Urinary bladder appears grossly normal. Stomach/Bowel: No bowel obstruction, free air or pneumatosis. Stable chronic mild wall thickening throughout the small bowel with congestive appearance and accompanying hazy mesenteric stranding densities. No  bowel wall edema identified. No evidence of acute appendicitis. Vascular/Lymphatic: Numerous large varices are again seen mostly in the upper mid abdomen and left upper quadrant, measuring up to 2.5 cm in diameter. Cavernous transformation of the portal vein again seen. No definite bulky lymphadenopathy identified. Reproductive: Uterus and bilateral adnexa are unremarkable. Other: Small volume ascites.  Mild body wall subcutaneous edema. Musculoskeletal: No suspicious bony lesions visualized. IMPRESSION: 1. Hepatic cirrhosis. 2. Portal hypertension including splenomegaly and numerous large varices mostly in the upper abdomen. 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 3. Small volume ascites. 4. Stable extrahepatic biliary ductal dilatation. 5. Chronic mild wall thickening of small bowel loops and hazy densities in the mesentery, likely related to cirrhosis and portal hypertension. Electronically Signed   By: DOfilia NeasM.D.   On: 05/09/2021 15:51   DG Chest Port 1 View  Result Date: 05/09/2021 CLINICAL DATA:  Altered mental status EXAM: PORTABLE CHEST 1 VIEW COMPARISON:  Chest x-ray 04/19/2021 FINDINGS: Study is significantly limited due to uncooperative patient/positioning. Left lung appears grossly clear with likely chronic mildly prominent interstitial lung markings. Heart is enlarged. No large pneumothorax visualized. IMPRESSION: As above. Electronically Signed  By: Ofilia Neas M.D.   On: 05/09/2021 13:24    Scheduled Meds:  insulin aspart  0-15 Units Subcutaneous TID WC   lactulose  300 mL Rectal TID   levothyroxine  50 mcg Oral QAC breakfast   lipase/protease/amylase  48,000 Units Oral TID AC   LORazepam  0.5 mg Intravenous Q4H   mouth rinse  15 mL Mouth Rinse BID   rifaximin  550 mg Oral BID   Continuous Infusions:   LOS: 0 days   Time spent: 40 mins  Maurica Omura Wynetta Emery, MD How to contact the [facility] Attending or Consulting provider [place] or covering provider during  after hours [place], for this patient?  Check the care team in [facility] and look for a) attending/consulting [facility] provider listed and b) the [facility] team listed Log into [email or web address] and use [place]'s universal password to access. If you do not have the password, please contact the hospital operator. Locate the [facility] provider you are looking for under [facility] and page to a number that you can be directly reached. If you still have difficulty reaching the provider, please page the [facility] (Director on Call) for the Hospitalists listed on amion for assistance.  05/10/2021, 10:52 AM

## 2021-05-10 NOTE — Progress Notes (Signed)
°   05/10/21 2325  Assess: MEWS Score  Temp 99.7 F (37.6 C)  BP (!) 157/51  Pulse Rate (!) 114  Resp (!) 40  SpO2 91 %  O2 Device Nasal Cannula  O2 Flow Rate (L/min) 3.5 L/min  Assess: MEWS Score  MEWS Temp 0  MEWS Systolic 0  MEWS Pulse 2  MEWS RR 3  MEWS LOC 2  MEWS Score 7  MEWS Score Color Red

## 2021-05-10 NOTE — Progress Notes (Addendum)
Total Bilirubin Comprehensive metabolic panel Collected: 05/09/21 1125  Result status: Final  Resulting lab: [place] [facility]  Reference range: 0.3 - 1.2 mg/dL  Value: 45.0 High Panic    Comment: CRITICAL RESULT CALLED TO, READ BACK BY AND VERIFIED WITH:  TO BAIN, C. AT 1214 ON 05/09/21 BY BROWNING, D.    Lactic Acid, Venous Lactic acid, plasma Collected: 05/09/21 1251  Result status: Final  Resulting lab: [place] [facility]  Reference range: 0.5 - 1.9 mmol/L  Value: 2.7 High Panic    Comment: CRITICAL RESULT CALLED TO, READ BACK BY AND VERIFIED WITH:  TO BAIN, C. AT 1329 ON 05/09/21 BY BROWNING, D.   On call notified. No new orders at this time. Will continue to monitor. Pt lying in bed with eyes closed.

## 2021-05-10 NOTE — Consult Note (Addendum)
Palliative Care Consult Note                                  Date: 05/10/2021   Patient Name: [name]  DOB: October 22, 1965  MRN: [id number]  Age / Sex: 56 y.o., female  PCP: [facility] Referring Physician: Murlean Iba, MD  Reason for Consultation: Establishing goals of care  HPI/Patient Profile: 56 y.o. female  with past medical history of alcoholic liver cirrhosis, diabetes mellitus hypertension, COPD, chronic respiratory failure, diastolic CHF. She presented to the ED with AMS. She was recently in [facility] and not taking lactulose as she should. She was admitted on 05/09/2021 with acute encephalopathy, chronic/subacute SDH, ETOH cirrhosis, pancytopenia, and others.  The patient went from confused to obtunded. Has been getting lactulose via enema to try and help with confusion. So far remains unresponsive. She has a history of frequent admissions.  2/20 - MELD-Na Score = 33  PMT was consulted for Wheeler discussions.   Past Medical History:  Diagnosis Date   Degenerative joint disease    Diabetes mellitus without complication ([place])    Gastroesophageal reflux    Pancreatitis    Panic attack    Sleep apnea    Thrombocytopenia (HCC)     Subjective:   This NP Walden Field reviewed medical records, received report from team, assessed the patient and then meet at the patient's bedside to discuss diagnosis, prognosis, GOC, EOL wishes disposition and options.  I met with the patient and her significant other Felicita Gage at the bedside.  However, the patient was obtunded and unable to participate in conversations.   Concept of Palliative Care was introduced as specialized medical care for people and their families living with serious illness.  If focuses on providing relief from the symptoms and stress of a serious illness.  The goal is to improve quality of life for both the patient and the family. Values and goals of care  important to patient and family were attempted to be elicited.  Created space and opportunity for patient  and family to explore thoughts and feelings regarding current medical situation   Natural trajectory and current clinical status were discussed. Questions and concerns addressed. Patient  encouraged to call with questions or concerns.    Patient/Family Understanding of Illness: He knows that they are waiting for additional labs.  He knows she has alcoholic cirrhosis, heart failure, and her liver is slowly shutting down.  She also has pancreas problems.  He notes that she is frequently confused and has been admitted multiple times for this.  He can typically tell when she is bad based on "what age she ask" such as he knows it is getting bad if she is acting like a teenager and arguing a lot, knows it is really bad if she is acting like a toddler and just crying most of the time.  We had further discussion about end-stage decompensated liver disease, need for lactulose to maintain mental status, and recognizing the downward trajectory she has had related to her liver disease.  She was previously seen by [facility] and they talked about a transplant but deemed her not a candidate due to chronic comorbidities.  He accepts that she may be nearing the end of life.  Goals: Her significant other states that her goal is to not die in the hospital.  Today's Discussion: In addition to the discussion  about her current clinical status as per above, we had substantial discussion about liver disease, her downward trajectory, and the kind of person she was.  He notes that the further she will ever want to go his BiPAP as far as resuscitation.  I noted that he was named healthcare power of attorney and the official documents (see ACP/[place]).  He states that she was enrolled in hospice about 2 years ago but disenrolled because she felt "they just wanted to force medications on me to make me comfortable rather  than let me live my life."  We discussed the progressive nature of her liver disease and with transplant not an option that this is a life limiting illness.  I believe he recognizes that she is approaching end-of-life based on her frequent admissions and downward trajectory.  He notes that her feelings are "the only Roland Rack in her life is that God brings you into this world, the government makes you pay taxes while you are here, and in God decides when it is time for you to leave this life."  He states if he gets to the point that she is not likely to survive he will notify family to come say goodbye.  He seems to be at peace with her passing and I suspect he has been thinking about this throughout her downward trajectory.  We discussed that although it is her wish to not die in the hospital, depending on placement situation that she may pass away here if she has a sudden change for the worse.  Otherwise it seems that her goal is to die at home.  We discussed that depending on whether she comes around the next day or 2 we may have to start having discussions about hospice care.  He notes that he works nights but there are support systems he can tap into to help care for her as well such as church family member (someone at her church as a Merchandiser, retail), her brother, and other family.  I provided emotional general support therapeutic listening, therapeutic silence, empathy, sharing stories, and other techniques.  I answered all questions and addressed all concerns to the best my ability  I provided a copy of the book "difficult choices for loving people" to support education related to end-of-life.  Review of Systems  Unable to perform ROS: Mental status change   Objective:   Primary Diagnoses: Present on Admission:  Acute hepatic encephalopathy  Alcoholic cirrhosis (HCC)  Essential hypertension  Lactic acidosis  Pancytopenia (HCC)  Thrombocytopenia (HCC)  Subdural hematoma  DNR (do not  resuscitate)   Physical Exam Vitals and nursing note reviewed.  Constitutional:      General: She is not in acute distress.    Appearance: She is ill-appearing.  HENT:     Head: Normocephalic and atraumatic.  Cardiovascular:     Rate and Rhythm: Normal rate.  Pulmonary:     Effort: Pulmonary effort is normal. No respiratory distress.  Abdominal:     General: Abdomen is flat.     Palpations: Abdomen is soft.  Skin:    General: Skin is warm and dry.  Neurological:     Mental Status: She is unresponsive.    Vital Signs:  BP 129/61 (BP Location: Right Arm)    Pulse (!) 109    Temp 98.5 F (36.9 C) (Oral)    Resp 20    Wt 51.5 kg    SpO2 100%    BMI 20.11 kg/m   Palliative  Assessment/Data: 10%    Advanced Care Planning:   Primary Decision Maker: OTHER: HCPOA/significant other  Code Status/Advance Care Planning: DNR  A discussion was had today regarding advanced directives. Concepts specific to code status, artifical feeding and hydration, continued IV antibiotics and rehospitalization was had.  The difference between a aggressive medical intervention path and a palliative comfort care path for this patient at this time was had.   Decisions/Changes to ACP: None today  Assessment & Plan:   Impression: Chronically and acutely ill 56 year old female with end-stage liver disease who presents for AMS likely due to hepatic encephalopathy.  She has not been taking lactulose as recommended.  She is not a transplant candidate per [facility] (as stated by her significant other/HCPOA).  She has had progressive decline related to her liver disease.  She has had frequent admissions for this.  She was enrolled in hospice sometime ago but disenrolled.  Overall prognosis is quite poor.  Being that she has been here less than 24 hours I think it is reasonable that we give her another day or 2 to see if she is able to wake up and have discussions with her regarding possible reenrollment with  hospice.  I feel at this point she would likely qualify for home hospice or residential hospice, but if she becomes more awake then residential hospice may not be appropriate.  SUMMARY OF RECOMMENDATIONS   Continue to treat the treatable Remain DNR Titrate lactulose for 2-4 soft bowel movements today Emotional support of family Watchful waiting PMT will continue to follow  Symptom Management:  Per primary team PMT is available to assist as needed  Prognosis:  < 6 months  Discharge Planning:  To Be Determined   Discussed with: Patient's significant other, medical team, nursing team    Thank you for allowing [place] to participate in the care of ALLANTE BEANE PMT will continue to support holistically.  Time Total: 90 min  Greater than 50%  of this time was spent counseling and coordinating care related to the above assessment and plan.  Signed by: Walden Field, NP Palliative Medicine Team  Team Phone # [phone] (Nights/Weekends)  05/10/2021, 3:46 PM

## 2021-05-10 NOTE — Progress Notes (Signed)
°   05/10/21 2336  Vitals  Temp 99.5 F (37.5 C)  Temp Source Oral  BP (!) 142/56  MAP (mmHg) 81  BP Location Right Arm  BP Method Automatic  Pulse Rate (!) 114  Pulse Rate Source Monitor  Resp (!) 38  MEWS COLOR  MEWS Score Color Red  Oxygen Therapy  SpO2 92 %  O2 Device Nasal Cannula  O2 Flow Rate (L/min) 3.5 L/min  MEWS Score  MEWS Temp 0  MEWS Systolic 0  MEWS Pulse 2  MEWS RR 3  MEWS LOC 2  MEWS Score 7   Increased work of breathing, O2 increased from 2L 88% to 92% O2 Sat on 3.5 L . Patient repositioned and head of bed elevated. Dr. Carren Rang notified. Breathing treatments ordered. Respiratory notified.

## 2021-05-10 NOTE — Assessment & Plan Note (Addendum)
Unable to take anything p.o. at this time.  05/12/2021: Pt transitioning to full comfort measures.  Expect death in days.

## 2021-05-10 NOTE — Progress Notes (Signed)
New critical lab called to this LPN at 8676 Total Bilirubin Comprehensive metabolic panel Collected: 05/10/21 0419  Result status: Final  Resulting lab: [facility]  Reference range: 0.3 - 1.2 mg/dL  Value: 19.5 High Panic    Comment: CRITICAL RESULT CALLED TO, READ BACK BY AND VERIFIED WITH:  JONES,J AT 6:25AM ON 05/10/21 BY [facility]   Message sent to on call. No new orders at this time. Pt lying in bed pull at flexi seal. Calling out for "SAMMY". Will continue to monitor

## 2021-05-10 NOTE — Hospital Course (Addendum)
56 y.o. female with medical history significant for alcoholic liver cirrhosis, diabetes mellitus hypertension, COPD, chronic respiratory failure, diastolic CHF. Patient was brought to the ED with reports of altered mental status.  Patient significant other was initially present at bedside.  Patient had been to [place] on a trip for a few days and returned last night.  Last night she was a bit confused, but this morning patient the confusion was worse, with patient yelling "help me help me," and had defecated on herself.  Patient's significant other does not think patient had been taking her lactulose. On my evaluation patient is awake, but continuously yelling out in a loud voice " "Get me up".  She is not answering questions.   Recent hospitalization 1/21 to 2/6-for acute on chronic respiratory failure with hypoxia secondary to pleural effusion, decompensated CHF, improved with diuresis.  She is on 4 L at baseline.  Macrocytic anemia requiring 1 unit of PRBC.  Acute hepatic encephalopathy treated with lactulose and rifaximin.  Due to worsening bilirubin underwent MRI abdomen with MRCP, findings of chronic dilated CBD no interventions recommended.   ED Course: Temperature 97.2.  O2 sats 92 to 95% on room air.  Hemoglobin stable at 9.  Platelets low at 52.  ALP elevated at 143.  Total bilirubin 18.3 consistent with prior.  Lactic acid 2.7.  Chest x-ray-Limited due to uncooperativeness, no noticeable abnormality. 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 Head CTSmall subacute to chronic subdural hematoma in the left frontoparietal convexity. Minimal effacement of the adjacent brain. 2. No acute hemorrhage or herniation.   Oral lactulose ordered in ED, patient refused orals.  Geodone10 mg also given for imaging, without significant improvement in her mental status.  05/10/2021:  Pt remains  completely obtunded, unable to take oral lactulose, ammonia remains elevated and unchanged despite lactulose enemas.  Changed to DNR (significant other brought in DNR paperwork).   05/08/2021:  Pt continues to decline.  No meaningful improvement.  Having increasing respiratory distress. Ammonia rising precipitously despite lactulose enemas.  I met with spouse at bedside and he says patient wanted to die at home and he wanted to make patient comfortable.  Unfortunately due to his work schedule patient would be alone at home and hospice not able to accept her for home care.  Pt anticipated to expire in days.  No oral intake in last 48 hours.  Comfort care orders started.  Referral requested for residential hospice care and spouse is agreeable.    Hospice evaluated and felt patient is too terminally ill to survive transfer to facility. Pt placed in GIP status.

## 2021-05-10 NOTE — Assessment & Plan Note (Addendum)
Significant other brought in her DNR paperwork and was DNR order placed 2/20.  05/09/2021: Pt transitioning to full comfort measures.  Expect death in days.

## 2021-05-10 NOTE — Assessment & Plan Note (Addendum)
Due to end stage liver disease. 05/10/2021: Pt transitioning to full comfort measures.  Expect death in days.

## 2021-05-11 ENCOUNTER — Encounter (HOSPITAL_COMMUNITY): Payer: Self-pay | Admitting: Family Medicine

## 2021-05-11 ENCOUNTER — Inpatient Hospital Stay (HOSPITAL_COMMUNITY)
Admission: RE | Admit: 2021-05-11 | Discharge: 2021-05-19 | DRG: 951 | Disposition: E | Attending: Internal Medicine | Admitting: Internal Medicine

## 2021-05-11 DIAGNOSIS — Z79890 Hormone replacement therapy (postmenopausal): Secondary | ICD-10-CM

## 2021-05-11 DIAGNOSIS — I110 Hypertensive heart disease with heart failure: Secondary | ICD-10-CM | POA: Diagnosis present

## 2021-05-11 DIAGNOSIS — K7682 Hepatic encephalopathy: Secondary | ICD-10-CM | POA: Diagnosis present

## 2021-05-11 DIAGNOSIS — Z79899 Other long term (current) drug therapy: Secondary | ICD-10-CM

## 2021-05-11 DIAGNOSIS — K219 Gastro-esophageal reflux disease without esophagitis: Secondary | ICD-10-CM | POA: Diagnosis present

## 2021-05-11 DIAGNOSIS — F410 Panic disorder [episodic paroxysmal anxiety] without agoraphobia: Secondary | ICD-10-CM | POA: Diagnosis present

## 2021-05-11 DIAGNOSIS — D696 Thrombocytopenia, unspecified: Secondary | ICD-10-CM

## 2021-05-11 DIAGNOSIS — K7210 Chronic hepatic failure without coma: Secondary | ICD-10-CM | POA: Diagnosis present

## 2021-05-11 DIAGNOSIS — Z7984 Long term (current) use of oral hypoglycemic drugs: Secondary | ICD-10-CM

## 2021-05-11 DIAGNOSIS — D539 Nutritional anemia, unspecified: Secondary | ICD-10-CM | POA: Diagnosis present

## 2021-05-11 DIAGNOSIS — I6202 Nontraumatic subacute subdural hemorrhage: Secondary | ICD-10-CM | POA: Diagnosis present

## 2021-05-11 DIAGNOSIS — G4730 Sleep apnea, unspecified: Secondary | ICD-10-CM | POA: Diagnosis present

## 2021-05-11 DIAGNOSIS — E890 Postprocedural hypothyroidism: Secondary | ICD-10-CM | POA: Diagnosis present

## 2021-05-11 DIAGNOSIS — Z9049 Acquired absence of other specified parts of digestive tract: Secondary | ICD-10-CM

## 2021-05-11 DIAGNOSIS — Z881 Allergy status to other antibiotic agents status: Secondary | ICD-10-CM | POA: Diagnosis not present

## 2021-05-11 DIAGNOSIS — E119 Type 2 diabetes mellitus without complications: Secondary | ICD-10-CM | POA: Diagnosis present

## 2021-05-11 DIAGNOSIS — K766 Portal hypertension: Secondary | ICD-10-CM | POA: Diagnosis present

## 2021-05-11 DIAGNOSIS — Z7189 Other specified counseling: Secondary | ICD-10-CM | POA: Diagnosis not present

## 2021-05-11 DIAGNOSIS — Z794 Long term (current) use of insulin: Secondary | ICD-10-CM

## 2021-05-11 DIAGNOSIS — J9610 Chronic respiratory failure, unspecified whether with hypoxia or hypercapnia: Secondary | ICD-10-CM | POA: Diagnosis present

## 2021-05-11 DIAGNOSIS — Z882 Allergy status to sulfonamides status: Secondary | ICD-10-CM | POA: Diagnosis not present

## 2021-05-11 DIAGNOSIS — Z515 Encounter for palliative care: Principal | ICD-10-CM

## 2021-05-11 DIAGNOSIS — K7031 Alcoholic cirrhosis of liver with ascites: Secondary | ICD-10-CM | POA: Diagnosis present

## 2021-05-11 DIAGNOSIS — R0682 Tachypnea, not elsewhere classified: Secondary | ICD-10-CM | POA: Diagnosis not present

## 2021-05-11 DIAGNOSIS — Z66 Do not resuscitate: Secondary | ICD-10-CM | POA: Diagnosis present

## 2021-05-11 DIAGNOSIS — Z885 Allergy status to narcotic agent status: Secondary | ICD-10-CM

## 2021-05-11 DIAGNOSIS — K838 Other specified diseases of biliary tract: Secondary | ICD-10-CM | POA: Diagnosis present

## 2021-05-11 DIAGNOSIS — Z886 Allergy status to analgesic agent status: Secondary | ICD-10-CM

## 2021-05-11 DIAGNOSIS — R161 Splenomegaly, not elsewhere classified: Secondary | ICD-10-CM | POA: Diagnosis present

## 2021-05-11 DIAGNOSIS — Z888 Allergy status to other drugs, medicaments and biological substances status: Secondary | ICD-10-CM

## 2021-05-11 DIAGNOSIS — I5032 Chronic diastolic (congestive) heart failure: Secondary | ICD-10-CM | POA: Diagnosis present

## 2021-05-11 DIAGNOSIS — J449 Chronic obstructive pulmonary disease, unspecified: Secondary | ICD-10-CM | POA: Diagnosis present

## 2021-05-11 DIAGNOSIS — R0603 Acute respiratory distress: Secondary | ICD-10-CM | POA: Diagnosis not present

## 2021-05-11 DIAGNOSIS — G9341 Metabolic encephalopathy: Secondary | ICD-10-CM | POA: Diagnosis not present

## 2021-05-11 LAB — CBC
HCT: 26.2 % — ABNORMAL LOW (ref 36.0–46.0)
Hemoglobin: 8.2 g/dL — ABNORMAL LOW (ref 12.0–15.0)
MCH: 35.7 pg — ABNORMAL HIGH (ref 26.0–34.0)
MCHC: 31.3 g/dL (ref 30.0–36.0)
MCV: 113.9 fL — ABNORMAL HIGH (ref 80.0–100.0)
Platelets: 66 10*3/uL — ABNORMAL LOW (ref 150–400)
RBC: 2.3 MIL/uL — ABNORMAL LOW (ref 3.87–5.11)
RDW: 20.8 % — ABNORMAL HIGH (ref 11.5–15.5)
WBC: 5.5 10*3/uL (ref 4.0–10.5)
nRBC: 0 % (ref 0.0–0.2)

## 2021-05-11 LAB — PROTIME-INR
INR: 3.3 — ABNORMAL HIGH (ref 0.8–1.2)
Prothrombin Time: 33.2 seconds — ABNORMAL HIGH (ref 11.4–15.2)

## 2021-05-11 LAB — HEPATIC FUNCTION PANEL
ALT: 29 U/L (ref 0–44)
AST: 82 U/L — ABNORMAL HIGH (ref 15–41)
Albumin: 2.3 g/dL — ABNORMAL LOW (ref 3.5–5.0)
Alkaline Phosphatase: 98 U/L (ref 38–126)
Bilirubin, Direct: 6.8 mg/dL — ABNORMAL HIGH (ref 0.0–0.2)
Indirect Bilirubin: 12.1 mg/dL — ABNORMAL HIGH (ref 0.3–0.9)
Total Bilirubin: 18.9 mg/dL (ref 0.3–1.2)
Total Protein: 5.4 g/dL — ABNORMAL LOW (ref 6.5–8.1)

## 2021-05-11 LAB — BASIC METABOLIC PANEL
Anion gap: 10 (ref 5–15)
BUN: 30 mg/dL — ABNORMAL HIGH (ref 6–20)
CO2: 23 mmol/L (ref 22–32)
Calcium: 8.4 mg/dL — ABNORMAL LOW (ref 8.9–10.3)
Chloride: 107 mmol/L (ref 98–111)
Creatinine, Ser: 0.85 mg/dL (ref 0.44–1.00)
GFR, Estimated: >60 mL/min (ref >60)
Glucose, Bld: 134 mg/dL — ABNORMAL HIGH (ref 70–99)
Potassium: 4.5 mmol/L (ref 3.5–5.1)
Sodium: 140 mmol/L (ref 135–145)

## 2021-05-11 LAB — MAGNESIUM: Magnesium: 2 mg/dL (ref 1.7–2.4)

## 2021-05-11 LAB — AMMONIA: Ammonia: 104 umol/L — ABNORMAL HIGH (ref 9–35)

## 2021-05-11 LAB — GLUCOSE, CAPILLARY
Glucose-Capillary: 142 mg/dL — ABNORMAL HIGH (ref 70–99)
Glucose-Capillary: 146 mg/dL — ABNORMAL HIGH (ref 70–99)

## 2021-05-11 MED ORDER — POLYVINYL ALCOHOL 1.4 % OP SOLN: 1.0000 [drp] | Freq: Four times a day (QID) | OPHTHALMIC | Status: DC | PRN

## 2021-05-11 MED ORDER — FENTANYL CITRATE PF 50 MCG/ML IJ SOSY
100.0000 ug | PREFILLED_SYRINGE | Freq: Once | INTRAMUSCULAR | Status: AC
  Administered 2021-05-11: 100 ug via INTRAVENOUS
  Filled 2021-05-11: qty 2

## 2021-05-11 MED ORDER — ONDANSETRON HCL 4 MG/2ML IJ SOLN: 4.0000 mg | Freq: Four times a day (QID) | INTRAMUSCULAR | Status: DC | PRN

## 2021-05-11 MED ORDER — DIPHENHYDRAMINE HCL 50 MG/ML IJ SOLN: 12.5000 mg | INTRAMUSCULAR | Status: DC | PRN

## 2021-05-11 MED ORDER — FENTANYL CITRATE PF 50 MCG/ML IJ SOSY: 50.0000 ug | PREFILLED_SYRINGE | INTRAMUSCULAR | Status: DC | PRN

## 2021-05-11 MED ORDER — ONDANSETRON HCL 4 MG PO TABS: 4.0000 mg | ORAL_TABLET | Freq: Four times a day (QID) | ORAL | Status: DC | PRN

## 2021-05-11 MED ORDER — ATROPINE SULFATE 1 % OP SOLN
4.0000 [drp] | OPHTHALMIC | Status: DC | PRN
  Administered 2021-05-13: 4 [drp] via SUBLINGUAL
  Filled 2021-05-11: qty 5

## 2021-05-11 MED ORDER — FENTANYL CITRATE PF 50 MCG/ML IJ SOSY
50.0000 ug | PREFILLED_SYRINGE | INTRAMUSCULAR | Status: DC | PRN
  Administered 2021-05-11: 50 ug via INTRAVENOUS
  Administered 2021-05-11: 100 ug via INTRAVENOUS
  Administered 2021-05-12 – 2021-05-13 (×5): 50 ug via INTRAVENOUS
  Filled 2021-05-11: qty 2
  Filled 2021-05-11 (×6): qty 1

## 2021-05-11 MED ORDER — FENTANYL CITRATE PF 50 MCG/ML IJ SOSY
12.5000 ug | PREFILLED_SYRINGE | INTRAMUSCULAR | Status: DC | PRN
Start: 2021-05-11 — End: 2021-05-11
  Administered 2021-05-11: 12.5 ug via INTRAVENOUS
  Filled 2021-05-11: qty 1

## 2021-05-11 MED ORDER — HALOPERIDOL LACTATE 2 MG/ML PO CONC: 0.5000 mg | ORAL | Status: DC | PRN

## 2021-05-11 MED ORDER — LORAZEPAM 2 MG/ML IJ SOLN
0.5000 mg | Freq: Four times a day (QID) | INTRAMUSCULAR | Status: DC | PRN
  Administered 2021-05-11: 0.5 mg via INTRAVENOUS
  Filled 2021-05-11: qty 1

## 2021-05-11 MED ORDER — LORAZEPAM 2 MG/ML IJ SOLN
1.0000 mg | INTRAMUSCULAR | Status: DC | PRN
  Administered 2021-05-13 (×2): 1 mg via INTRAVENOUS
  Filled 2021-05-11 (×3): qty 1

## 2021-05-11 MED ORDER — ORAL CARE MOUTH RINSE
15.0000 mL | Freq: Two times a day (BID) | OROMUCOSAL | Status: DC
Start: 2021-05-11 — End: 2021-05-14
  Administered 2021-05-12 – 2021-05-13 (×2): 15 mL via OROMUCOSAL

## 2021-05-11 MED ORDER — HALOPERIDOL LACTATE 5 MG/ML IJ SOLN: 0.5000 mg | INTRAMUSCULAR | Status: DC | PRN

## 2021-05-11 MED ORDER — FENTANYL CITRATE PF 50 MCG/ML IJ SOSY
12.5000 ug | PREFILLED_SYRINGE | INTRAMUSCULAR | Status: DC | PRN
  Administered 2021-05-11: 25 ug via INTRAVENOUS
  Filled 2021-05-11: qty 1

## 2021-05-11 MED ORDER — HALOPERIDOL 0.5 MG PO TABS: 0.5000 mg | ORAL_TABLET | ORAL | Status: DC | PRN

## 2021-05-11 MED ORDER — FENTANYL CITRATE PF 50 MCG/ML IJ SOSY
50.0000 ug | PREFILLED_SYRINGE | INTRAMUSCULAR | Status: DC | PRN
  Administered 2021-05-11: 50 ug via INTRAVENOUS
  Filled 2021-05-11: qty 1

## 2021-05-11 MED ORDER — ATROPINE SULFATE 1 % OP SOLN: 4.0000 [drp] | OPHTHALMIC | Status: DC | PRN

## 2021-05-11 NOTE — Hospital Course (Addendum)
56 y.o. female with medical history significant for alcoholic liver cirrhosis, diabetes mellitus hypertension, COPD, chronic respiratory failure, diastolic CHF. °Patient was brought to the ED with reports of altered mental status.  Patient significant other was initially present at bedside.  Patient had been to [place] on a trip for a few days and returned last night.  Last night she was a bit confused, but this morning patient the confusion was worse, with patient yelling "help me help me," and had defecated on herself.  Patient's significant other does not think patient had been taking her lactulose. °On my evaluation patient is awake, but continuously yelling out in a loud voice " "Get me up".  She is not answering questions. °  °Recent hospitalization 1/21 to 2/6-for acute on chronic respiratory failure with hypoxia secondary to pleural effusion, decompensated CHF, improved with diuresis.  She is on 4 L at baseline.  Macrocytic anemia requiring 1 unit of PRBC.  Acute hepatic encephalopathy treated with lactulose and rifaximin.  Due to worsening bilirubin underwent MRI abdomen with MRCP, findings of chronic dilated CBD no interventions recommended. °  °ED Course: Temperature 97.2.  O2 sats 92 to 95% on room air.  Hemoglobin stable at 9.  Platelets low at 52.  ALP elevated at 143.  Total bilirubin 18.3 consistent with prior.  Lactic acid 2.7.  Chest x-ray-Limited due to uncooperativeness, no noticeable abnormality. °Abdominal CT done due to complaints of abdominal pain-shows splenomegaly, portal hypertension, numerous large varices in upper abdomen no acute findings. °Head CTSmall subacute to chronic subdural hematoma in the left °frontoparietal convexity. Minimal effacement of the adjacent brain. °2. No acute hemorrhage or herniation. °  °Oral lactulose ordered in ED, patient refused orals.  Geodone10 mg also given for imaging, without significant improvement in her mental status. ° °05/10/2021:  Pt remains  completely obtunded, unable to take oral lactulose, ammonia remains elevated and unchanged despite lactulose enemas.  Changed to DNR (significant other brought in DNR paperwork).  ° °04/29/2021:  Pt continues to decline.  No meaningful improvement.  Having increasing respiratory distress. Ammonia rising precipitously despite lactulose enemas.  I met with spouse at bedside and he says patient wanted to die at home and he wanted to make patient comfortable.  Unfortunately due to his work schedule patient would be alone at home and hospice not able to accept her for home care.  Pt anticipated to expire in days.  No oral intake in last 48 hours.  Comfort care orders started.  Referral requested for residential hospice care and spouse is agreeable.   ° °Hospice evaluated and felt patient is too terminally ill to survive transfer to facility. Pt placed in GIP status.   °

## 2021-05-11 NOTE — H&P (Signed)
History and Physical    Patient: [name] ZHG:[id number] DOB: 31-Mar-1965 DOA: 04/21/2021 DOS: the patient was seen and examined on 05/18/2021 PCP: [facility] [place]  Patient coming from:  Transfer to GIP status  Chief Complaint: GIP Comfort Care Transition  HPI: [name] 56 y.o. female with medical history significant for alcoholic liver cirrhosis, diabetes mellitus hypertension, COPD, chronic respiratory failure, diastolic CHF. Patient was brought to the ED with reports of altered mental status.  Patient significant other was initially present at bedside.  Patient had been to [place] on a trip for a few days and returned last night.  Last night she was a bit confused, but this morning patient the confusion was worse, with patient yelling "help me help me," and had defecated on herself.  Patient's significant other does not think patient had been taking her lactulose. On my evaluation patient is awake, but continuously yelling out in a loud voice " "Get me up".  She is not answering questions.   Recent hospitalization 1/21 to 2/6-for acute on chronic respiratory failure with hypoxia secondary to pleural effusion, decompensated CHF, improved with diuresis.  She is on 4 L at baseline.  Macrocytic anemia requiring 1 unit of PRBC.  Acute hepatic encephalopathy treated with lactulose and rifaximin.  Due to worsening bilirubin underwent MRI abdomen with MRCP, findings of chronic dilated CBD no interventions recommended.   ED Course: Temperature 97.2.  O2 sats 92 to 95% on room air.  Hemoglobin stable at 9.  Platelets low at 52.  ALP elevated at 143.  Total bilirubin 18.3 consistent with prior.  Lactic acid 2.7.  Chest x-ray-Limited due to uncooperativeness, no noticeable abnormality. 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 Head CTSmall subacute to chronic subdural  hematoma in the left frontoparietal convexity. Minimal effacement of the adjacent brain. 2. No acute hemorrhage or herniation.   Oral lactulose ordered in ED, patient refused orals.  Geodone10 mg also given for imaging, without significant improvement in her mental status.   05/10/2021:  Pt remains completely obtunded, unable to take oral lactulose, ammonia remains elevated and unchanged despite lactulose enemas.  Changed to DNR (significant other brought in DNR paperwork).    04/22/2021:  Pt continues to decline.  No meaningful improvement.  Having increasing respiratory distress. Ammonia rising precipitously despite lactulose enemas.  I met with spouse at bedside and he says patient wanted to die at home and he wanted to make patient comfortable.  Unfortunately due to his work schedule patient would be alone at home and hospice not able to accept her for home care.  Pt anticipated to expire in days.  No oral intake in last 48 hours.  Comfort care orders started.  Referral requested for residential hospice care and spouse is agreeable.     Hospice evaluated and felt patient is too terminally ill to survive transfer to facility. Pt placed in GIP status.     Review of Systems: unable to review all systems due to the inability of the patient to answer questions.   Past Medical History:  Diagnosis Date   Degenerative joint disease    Diabetes mellitus without complication ([place])    Gastroesophageal reflux    Pancreatitis    Panic attack    Sleep apnea    Thrombocytopenia ([place])    Past Surgical History:  Procedure Laterality Date   CHOLECYSTECTOMY     SHOULDER SURGERY  left    tongue sx. titanium screw placed to hold tongue down     TOTAL THYROIDECTOMY     Social History:  reports that she has never smoked. She has never used smokeless tobacco. She reports that she does not drink alcohol and does not use drugs.  Allergies  Allergen Reactions   Acetaminophen Hives and Other (See  Comments)    Tolerates Fioricet   Cephalexin Itching, Swelling and Other (See Comments)    Tongue swells and makes throat itchy- can tolerate Zosyn   Ciprofloxacin Hives   Hydromorphone Hives   Sulfamethoxazole-Trimethoprim Shortness Of Breath, Swelling and Other (See Comments)    Tongue swells    Tizanidine Anaphylaxis   Abilify [Aripiprazole]     "I did noyt like the way this made me feel."    History reviewed. No pertinent family history.  Prior to Admission medications   Medication Sig Start Date End Date Taking? Authorizing Provider  albuterol (PROVENTIL) (2.5 MG/3ML) 0.083% nebulizer solution Inhale 3 mLs (2.5 mg total) into the lungs every 6 (six) hours as needed for wheezing or shortness of breath. 04/15/21   Denton Brick, Courage, MD  albuterol (VENTOLIN HFA) 108 (90 Base) MCG/ACT inhaler Inhale 2 puffs into the lungs every 4 (four) hours as needed for wheezing or shortness of breath. 04/15/21   Denton Brick, Courage, MD  alendronate (FOSAMAX) 70 MG tablet Take 70 mg by mouth once a week. Patient not taking: Reported on 05/10/2021 03/11/21   [provider]  Continuous Blood Gluc Sensor (DEXCOM G6 SENSOR) MISC Inject into the skin See admin instructions. Place a new sensor under the skin every 10 days    [provider]  furosemide (LASIX) 40 MG tablet Take 1 tablet (40 mg total) by mouth daily. 04/26/21   Bonnielee Haff, MD  insulin aspart (NOVOLOG) 100 UNIT/ML injection Via Insulin Pump Patient taking differently: Inject 3-5 Units into the skin continuous. Via Insulin Pump 07/31/20   Jex Strausbaugh L, MD  lactulose (CHRONULAC) 10 GM/15ML solution Take 45 mLs (30 g total) by mouth 3 (three) times daily. Take 30 grams (45 ml's) by mouth three to five times a day until 3 bowel movements a day are achieved, then as directed Patient taking differently: Take 30 g by mouth See admin instructions. Take 30 grams (45 ml's) by mouth three to five times a day until 3 bowel movements a  day are achieved, then as directed 07/31/20   Murlean Iba, MD  levothyroxine (SYNTHROID, LEVOTHROID) 50 MCG tablet Take 50 mcg by mouth daily before breakfast.  07/25/14   [provider]  lipase/protease/amylase (CREON) 12000-38000 units CPEP capsule Take 4 capsules (48,000 Units total) by mouth 3 (three) times daily before meals. 04/15/21   Roxan Hockey, MD  Magnesium 400 MG TABS Take 400 mg by mouth 2 (two) times daily. 04/26/21   Bonnielee Haff, MD  ondansetron (ZOFRAN ODT) 4 MG disintegrating tablet Take 1 tablet (4 mg total) by mouth See admin instructions. Dissolve 4 mg orally one to two times a day Patient taking differently: Take 4 mg by mouth every 8 (eight) hours as needed for nausea or vomiting. 07/31/20   Wynetta Emery, Nicky Kras L, MD  Oxycodone HCl 10 MG TABS Take 10 mg by mouth every 6 (six) hours as needed (pain).    [provider]  OXYGEN Inhale 2-4 L/min into the lungs as needed (SOB).    [provider]  pantoprazole (PROTONIX) 40 MG tablet Take 1 tablet (40 mg  total) by mouth daily. 04/16/21   Roxan Hockey, MD  potassium chloride SA (KLOR-CON M) 20 MEQ tablet Take 1 tablet (20 mEq total) by mouth 2 (two) times daily. 04/15/21   Roxan Hockey, MD  spironolactone (ALDACTONE) 100 MG tablet Take 1 tablet (100 mg total) by mouth daily. 04/26/21   Bonnielee Haff, MD  Vitamin D, Ergocalciferol, (DRISDOL) 1.25 MG (50000 UNIT) CAPS capsule Take 50,000 Units by mouth every Wednesday. 06/29/20   [provider]  XIFAXAN 550 MG TABS tablet Take 550 mg by mouth 2 (two) times daily. 05/28/20   [provider]  lisinopril (ZESTRIL) 10 MG tablet Take 10 mg by mouth daily. Patient not taking: Reported on 10/02/2019 03/29/19 10/02/19  [provider]  metFORMIN (GLUCOPHAGE) 1000 MG tablet Take 1,000 mg by mouth 2 (two) times daily with a meal.  Patient not taking: Reported on 10/02/2019  10/02/19  [provider]  metoCLOPramide (REGLAN)  10 MG tablet Take 10 mg by mouth 4 (four) times daily.  Patient not taking: Reported on 10/02/2019  10/02/19  [provider]  sertraline (ZOLOFT) 100 MG tablet Take 150 mg by mouth daily. Patient not taking: Reported on 10/02/2019 09/10/16 10/02/19  [provider]  traZODone (DESYREL) 100 MG tablet Take 1 tablet by mouth daily. Patient not taking: Reported on 10/02/2019 09/15/16 10/02/19  [provider]   General exam: pt appears acutely ill and chronically ill at same time. She is encephalopathic foaming at the mouth.  She is severely jaundiced.  Pt appears terminally ill.  Respiratory system: shallow breathing bilaterally with coarse sounds and gasping for air intermittently.  Cardiovascular system: normal S1 & S2 heard.  Gastrointestinal system: Abdomen is distended, soft. Grimacing with exam of LUQ.  Central nervous system: obtunded/ encephalopathic. Extremities: moving all extremities spontaneously. Skin: severely jaundiced. Psychiatry: unable to determine  Data Reviewed:   Assessment and Plan: * Comfort measures only status Full comfort measures under GIP status  Continue pain management as ordered, titrate for comfort.  Full comfort order-set initiated.   Acute hepatic encephalopathy- (present on admission) Pt admitted under GIP for full comfort care measures.    DNR (do not resuscitate)- (present on admission) Full comfort measures   Advance Care Planning:   Code Status: DNR   Consults: palliative care   Family Communication: spouse updated at bedside multiple times 2/21  Severity of Illness: full comfort care  Author: Irwin Brakeman, MD 05/09/2021 4:15 PM  For on call review [email or web address].

## 2021-05-11 NOTE — TOC Progression Note (Signed)
Transition of Care [facility]) - Progression Note    Patient Details  Name: [name] MRN: [id number] Date of Birth: 1965-10-30  [facility] CM/SW Contact  Leitha Bleak, RN Phone Number: Jun 10, 2021, 4:51 PM  Clinical Narrative:   [facility]. Their assessment, patient is to low to move. Husband signed consent and patient made GIP.   Expected Discharge Plan: Hospice Medical Facility Barriers to Discharge:  (GIP)  Expected Discharge Plan and Services Expected Discharge Plan: Hospice Medical Facility     Readmission Risk Interventions Readmission Risk Prevention Plan 04/06/2021 07/27/2020 07/20/2020  Transportation Screening Complete - Complete  PCP or Specialist Appt within 3-5 Days - Complete -  HRI or Home Care Consult Complete - Complete  Social Work Consult for Recovery Care Planning/Counseling Complete - Complete  Palliative Care Screening Not Applicable - Not Applicable  Medication Review Oceanographer) Complete - Complete  Some recent data might be hidden

## 2021-05-11 NOTE — Progress Notes (Signed)
°PROGRESS NOTE ° ° °[name]  MRN:[id number] DOB: 08/12/1965 DOA: 05/09/2021 °PCP: [facility]  ° °Chief Complaint  °Patient presents with  ° Altered Mental Status  ° °Level of care: Med-Surg ° °Brief Admission History:  °56 y.o. female with medical history significant for alcoholic liver cirrhosis, diabetes mellitus hypertension, COPD, chronic respiratory failure, diastolic CHF. °Patient was brought to the ED with reports of altered mental status.  Patient significant other was initially present at bedside.  Patient had been to [place] on a trip for a few days and returned last night.  Last night she was a bit confused, but this morning patient the confusion was worse, with patient yelling "help me help me," and had defecated on herself.  Patient's significant other does not think patient had been taking her lactulose. °On my evaluation patient is awake, but continuously yelling out in a loud voice " "Get me up".  She is not answering questions. °  °Recent hospitalization 1/21 to 2/6-for acute on chronic respiratory failure with hypoxia secondary to pleural effusion, decompensated CHF, improved with diuresis.  She is on 4 L at baseline.  Macrocytic anemia requiring 1 unit of PRBC.  Acute hepatic encephalopathy treated with lactulose and rifaximin.  Due to worsening bilirubin underwent MRI abdomen with MRCP, findings of chronic dilated CBD no interventions recommended. °  °ED Course: Temperature 97.2.  O2 sats 92 to 95% on room air.  Hemoglobin stable at 9.  Platelets low at 52.  ALP elevated at 143.  Total bilirubin 18.3 consistent with prior.  Lactic acid 2.7.  Chest x-ray-Limited due to uncooperativeness, no noticeable abnormality. °Abdominal CT done due to complaints of abdominal pain-shows splenomegaly, portal hypertension, numerous large varices in upper abdomen no acute findings. °Head CTSmall subacute to chronic subdural hematoma in the left °frontoparietal convexity. Minimal effacement of  the adjacent brain. °2. No acute hemorrhage or herniation. °  °Oral lactulose ordered in ED, patient refused orals.  Geodone10 mg also given for imaging, without significant improvement in her mental status. ° °05/10/2021:  Pt remains completely obtunded, unable to take oral lactulose, ammonia remains elevated and unchanged despite lactulose enemas.  Changed to DNR (significant other brought in DNR paperwork).  ° °05/18/2021:  Pt continues to decline.  No meaningful improvement.  Having increasing respiratory distress. Ammonia rising precipitously despite lactulose enemas.  I met with spouse at bedside and he says patient wanted to die at home and he wanted to make patient comfortable.  Unfortunately due to his work schedule patient would be alone at home and hospice not able to accept her for home care.  Pt anticipated to expire in days.  No oral intake in last 48 hours.  Comfort care orders started.  Referral requested for residential hospice care and spouse is agreeable.   °  °Assessment and Plan: °* Acute hepatic encephalopathy- (present on admission) °Acute metabolic encephalopathy, appears to be hepatic in etiology.  Significant other reports patient NOT taking lactulose over the past few days.  Ammonia level - elevated at 65. Blood alcohol level, <10.  Head CT showing 4 mm subacute to chronic hematoma in the left frontoparietal convexity- I talked to neurosurgery unlikely cause of patient's altered mental status. °ABD CT-without acute findings, shows splenomegaly, numerous large varices in the upper abdomen, stable biliary ductal dilatation.  Patient refusing oral lactulose at this time and too obtunded to swallow.  Continue lactulose enemas.  °- rectal tube ordered °- UDS negative. °- AMMONIA CONTINUES TO RISE   PRECIPITOUSLY DESPITE LACTULOSE ENEMAS.  °05/13/2021: Pt transitioning to full comfort measures.  Expect death in days.  ° °Subdural hematoma- (present on admission) °CT findings- 4mm Small subacute to  chronic subdural hematoma in the left frontoparietal convexity. Minimal effacement of the adjacent brain. 2. No acute hemorrhage or herniation. °-Not on anticoagulation, but has chronic thrombocytopenia platelets in the 50s. °-I talked to neurosurgery nurse practitioner on-call Kim Meyran-hematoma is small so unlikely etiology of patient's altered mental status.  Okay to keep at [place], can repeat CT head imaging in the morning. °Repeat CT head ordered 2/20 WITH NO CHANGE IN SIZE OF HEMATOMA.  °05/09/2021: Pt transitioning to full comfort measures.  Expect death in days.  ° °DNR (do not resuscitate)- (present on admission) °Significant other brought in her DNR paperwork and was DNR order placed 2/20.  °04/27/2021: Pt transitioning to full comfort measures.  Expect death in days.  ° °Pancytopenia (HCC)- (present on admission) °Due to end stage liver disease. °05/15/2021: Pt transitioning to full comfort measures.  Expect death in days.   ° °Lactic acidosis- (present on admission) °Lactic acid 2.7.  At this time no focus of infection identified.  No concern for sepsis.  SEPSIS RULED OUT °05/06/2021: Pt transitioning to full comfort measures.  Expect death in days.  ° °Alcoholic cirrhosis (HCC)- (present on admission) °Chronically elevated total bilirubin of 18.  With pancytopenia, platelets of 50s, Hgb stable at 9. °INR elevated at 3.2.  Pt appears very ill.  Palliative care consultation requested.  °ALP elevated at 143 new, abdominal CT -stable extrahepatic biliary ductal dilatation. °-Follow CMP °-Mag -Repleted °05/13/2021: Pt transitioning to full comfort measures.  Expect death in days.  Unable to take anything oral.  ° °Thrombocytopenia (HCC)- (present on admission) °Due to end stage liver disease ° °Type 2 diabetes mellitus (HCC) °05/10/2021:  Pt transitioning to full comfort measures ° ° °Essential hypertension- (present on admission) °Unable to take anything p.o. at this time.  °05/13/2021: Pt transitioning to  full comfort measures.  Expect death in days.  ° °DVT prophylaxis: full comfort measures  °Code Status: DNR  °Family Communication: spouse at bedside met with him for 35 mins to discuss prognosis and goals of care.  °Disposition: Status is: Inpatient °Remains inpatient appropriate because: severity of illness °  °Consultants:  °Palliative care °Procedures:  ° °Antimicrobials:  °  °Subjective: °Pt obtunded, severely encephalopathic, grimacing with pain and discomfort. Gasping for air at times.  °Objective: °Vitals:  ° 05/02/2021 0244 05/03/2021 0334 05/01/2021 0732 04/23/2021 0800  °BP: (!) 152/57 (!) 154/61 (!) 131/52   °Pulse: (!) 106 (!) 115 (!) 108 (!) 116  °Resp: (!) 37 (!) 35 (!) 22 (!) 36  °Temp: 99.4 °F (37.4 °C) 99.5 °F (37.5 °C)    °TempSrc:      °SpO2: 92% 95% 94% 95%  °Weight:      ° ° °Intake/Output Summary (Last 24 hours) at 04/24/2021 1147 °Last data filed at 05/12/2021 0958 °Gross per 24 hour  °Intake 591.31 ml  °Output 1000 ml  °Net -408.69 ml  ° °Filed Weights  ° 05/09/21 1830  °Weight: 51.5 kg  ° °Examination: ° °General exam: pt appears acutely ill and chronically ill at same time. She is encephalopathic foaming at the mouth.  She is severely jaundiced.  Pt appears terminally ill.  °Respiratory system: shallow breathing bilaterally with coarse sounds and gasping for air intermittently.  °Cardiovascular system: normal S1 & S2 heard.  °Gastrointestinal system: Abdomen is distended, soft.   Grimacing with exam of LUQ.  °Central nervous system: obtunded/ encephalopathic. °Extremities: moving all extremities spontaneously. °Skin: severely jaundiced. °Psychiatry: unable to determine  ° °Data Reviewed: I have personally reviewed following labs and imaging studies ° °CBC: °Recent Labs  °Lab 05/09/21 °1125 05/10/21 °0419 05/15/2021 °0338  °WBC 3.4* 3.5* 5.5  °HGB 9.0* 8.6* 8.2*  °HCT 27.3* 26.2* 26.2*  °MCV 108.3* 110.1* 113.9*  °PLT 52* 54* 66*  ° ° °Basic Metabolic Panel: °Recent Labs  °Lab 05/09/21 °1125  05/09/21 °1251 05/10/21 °0419 04/21/2021 °0338  °NA 131*  --  133* 140  °K 4.0  --  3.7 4.5  °CL 95*  --  101 107  °CO2 27  --  27 23  °GLUCOSE 299*  --  127* 134*  °BUN 16  --  15 30*  °CREATININE 0.54  --  0.42* 0.85  °CALCIUM 9.1  --  8.3* 8.4*  °MG  --  1.5* 2.0 2.0  °PHOS  --  3.0  --   --   ° ° °CBG: °Recent Labs  °Lab 05/10/21 °1121 05/10/21 °1607 05/10/21 °2228 05/07/2021 °0307 05/04/2021 °0728  °GLUCAP 161* 153* 133* 142* 146*  ° ° °Recent Results (from the past 240 hour(s))  °Resp Panel by RT-PCR (Flu A&B, Covid) Nasopharyngeal Swab     Status: None  ° Collection Time: 05/09/21 11:46 AM  ° Specimen: Nasopharyngeal Swab; Nasopharyngeal(NP) swabs in vial transport medium  °Result Value Ref Range Status  ° SARS Coronavirus 2 by RT PCR NEGATIVE NEGATIVE Final  °  Comment: (NOTE) °SARS-CoV-2 target nucleic acids are NOT DETECTED. ° °The SARS-CoV-2 RNA is generally detectable in upper respiratory °specimens during the acute phase of infection. The lowest °concentration of SARS-CoV-2 viral copies this assay can detect is °138 copies/mL. A negative result does not preclude SARS-Cov-2 °infection and should not be used as the sole basis for treatment or °other patient management decisions. A negative result may occur with  °improper specimen collection/handling, submission of specimen other °than nasopharyngeal swab, presence of viral mutation(s) within the °areas targeted by this assay, and inadequate number of viral °copies(<138 copies/mL). A negative result must be combined with °clinical observations, patient history, and epidemiological °information. The expected result is Negative. ° °Fact Sheet for Patients:  °https://www.fda.gov/media/152166/download ° °Fact Sheet for Healthcare Providers:  °https://www.fda.gov/media/152162/download ° °This test is no t yet approved or cleared by the [place] FDA and  °has been authorized for detection and/or diagnosis of SARS-CoV-2 by °FDA under an Emergency Use  Authorization (EUA). This EUA will remain  °in effect (meaning this test can be used) for the duration of the °COVID-19 declaration under Section 564(b)(1) of the Act, 21 °U.S.C.section 360bbb-3(b)(1), unless the authorization is terminated  °or revoked sooner.  ° ° °  ° Influenza A by PCR NEGATIVE NEGATIVE Final  ° Influenza B by PCR NEGATIVE NEGATIVE Final  °  Comment: (NOTE) °The Xpert Xpress SARS-CoV-2/FLU/RSV plus assay is intended as an aid °in the diagnosis of influenza from Nasopharyngeal swab specimens and °should not be used as a sole basis for treatment. Nasal washings and °aspirates are unacceptable for Xpert Xpress SARS-CoV-2/FLU/RSV °testing. ° °Fact Sheet for Patients: °https://www.fda.gov/media/152166/download ° °Fact Sheet for Healthcare Providers: °https://www.fda.gov/media/152162/download ° °This test is not yet approved or cleared by the [place] FDA and °has been authorized for detection and/or diagnosis of SARS-CoV-2 by °FDA under an Emergency Use Authorization (EUA). This EUA will remain °in effect (meaning this test can be used) for the duration   duration of the COVID-19 declaration under Section 564(b)(1) of the Act, 21 U.S.C. section 360bbb-3(b)(1), unless the authorization is terminated or revoked.  Performed at [facility], [address], [place], [place] [address]   Culture, blood (routine x 2)     Status: None (Preliminary result)   Collection Time: 05/09/21 12:51 PM   Specimen: BLOOD RIGHT HAND  Result Value Ref Range Status   Specimen Description BLOOD RIGHT HAND BOTTLES DRAWN AEROBIC ONLY  Final   Special Requests Blood Culture adequate volume  Final   Culture   Final    NO GROWTH 2 DAYS Performed at [facility], [address] [place], [place] [address]    Report Status PENDING  Incomplete  Culture, blood (routine x 2)     Status: None (Preliminary result)   Collection Time: 05/09/21 12:51 PM   Specimen: BLOOD RIGHT WRIST  Result Value Ref Range Status   Specimen  Description   Final    BLOOD RIGHT WRIST BOTTLES DRAWN AEROBIC AND ANAEROBIC   Special Requests Blood Culture adequate volume  Final   Culture   Final    NO GROWTH 2 DAYS Performed at [facility], [address] [place], [place] [address]    Report Status PENDING  Incomplete     Radiology Studies: CT HEAD WO CONTRAST (5MM)  Result Date: 05/10/2021 CLINICAL DATA:  Provided history: Subdural hematoma. Follow-up subdural hematoma. EXAM: CT HEAD WITHOUT CONTRAST TECHNIQUE: Contiguous axial images were obtained from the base of the skull through the vertex without intravenous contrast. RADIATION DOSE REDUCTION: This exam was performed according to the departmental dose-optimization program which includes automated exposure control, adjustment of the mA and/or kV according to patient size and/or use of iterative reconstruction technique. COMPARISON:  Prior head CT examinations 05/09/2021 and earlier. FINDINGS: Brain: Mildly motion degraded exam. Redemonstrated intermediate-to-low density, subacute-to-chronic subdural hematoma overlying the left cerebral hemisphere. Unchanged from the head CT performed yesterday, this hematoma measures up to 5 mm in thickness (remeasured on prior). As before, there is minimal mass effect upon the underlying left cerebral hemisphere with trace rightward midline shift. No demarcated cortical infarct. No evidence of an intracranial mass. No midline shift. Vascular: No hyperdense vessel. Skull: Normal. Negative for fracture or focal lesion. Sinuses/Orbits: Visualized orbits show no acute finding. No significant paranasal sinus disease. IMPRESSION: Mildly motion degraded exam. Unchanged subacute-to-chronic subdural hematoma overlying left cerebral hemisphere, measuring up to 5 mm. As before, there is minimal mass effect upon the underlying left cerebral hemisphere with trace rightward midline shift. Electronically Signed   By: Kellie Simmering D.O.   On: 05/10/2021 14:01   CT Head  Wo Contrast  Result Date: 05/09/2021 CLINICAL DATA:  Altered mental status EXAM: CT HEAD WITHOUT CONTRAST TECHNIQUE: Contiguous axial images were obtained from the base of the skull through the vertex without intravenous contrast. RADIATION DOSE REDUCTION: This exam was performed according to the departmental dose-optimization program which includes automated exposure control, adjustment of the mA and/or kV according to patient size and/or use of iterative reconstruction technique. COMPARISON:  CT head 03/16/2021 FINDINGS: Somewhat limited due to patient positioning and condition. Brain: No acute intraparenchymal hematoma. There is a low-density extra-axial likely subacute to chronic subdural hematoma on the left which measures up to 4 mm in thickness in the frontal convexity. Minimal mass effect on the adjacent brain. No evidence of herniation. No hydrocephalus. Vascular: No hyperdense vessel or unexpected calcification. Skull: Normal. Negative for fracture or focal lesion. Sinuses/Orbits: No acute finding.  None. IMPRESSION: 1. Small subacute to chronic subdural hematoma in the left frontoparietal convexity. Minimal effacement of the adjacent brain. 2. No acute hemorrhage or herniation. Findings discussed with Dr. Hong over the telephone at 3:57 p.m. on 05/09/2021 with read back. Electronically Signed   By: Delaney  Williams M.D.   On: 05/09/2021 15:59  ° °CT Abdomen Pelvis W Contrast ° °Result Date: 05/09/2021 °CLINICAL DATA:  Abdominal pain EXAM: CT ABDOMEN AND PELVIS WITH CONTRAST TECHNIQUE: Multidetector CT imaging of the abdomen and pelvis was performed using the standard protocol following bolus administration of intravenous contrast. RADIATION DOSE REDUCTION: This exam was performed according to the departmental dose-optimization program which includes automated exposure control, adjustment of the mA and/or kV according to patient size and/or use of iterative reconstruction technique. CONTRAST:   100mL OMNIPAQUE IOHEXOL 300 MG/ML  SOLN COMPARISON:  CT abdomen and pelvis 07/19/2020 FINDINGS: Lower chest: Cardiomegaly. Subsegmental atelectatic changes in the lung bases. Hepatobiliary: Diffuse nodularity of the liver with inhomogeneous parenchyma consistent with cirrhosis. Interval enlargement of a 13 mm well-defined hypodensity in the right hepatic lobe which likely represents a cyst. There is a faint ill-defined hypodensity near the dome of the right hepatic lobe measuring approximately 1 cm, not definitely seen on previous CT and no abnormality visualized in the region on recent MRI. Gallbladder is surgically absent. Extrahepatic biliary ductal dilatation similar to previous study, the common bile duct measures 16 mm in diameter. No significant intrahepatic ductal dilatation. Pancreas: Atrophic with no suspicious mass or ductal dilatation visualized. Spleen: Enlarged measuring 14.7 cm in length. Adrenals/Urinary Tract: No adrenal nodules identified. A few small renal cortical cysts visualized bilaterally. No hydronephrosis or definite enhancing renal mass identified. Urinary bladder appears grossly normal. Stomach/Bowel: No bowel obstruction, free air or pneumatosis. Stable chronic mild wall thickening throughout the small bowel with congestive appearance and accompanying hazy mesenteric stranding densities. No bowel wall edema identified. No evidence of acute appendicitis. Vascular/Lymphatic: Numerous large varices are again seen mostly in the upper mid abdomen and left upper quadrant, measuring up to 2.5 cm in diameter. Cavernous transformation of the portal vein again seen. No definite bulky lymphadenopathy identified. Reproductive: Uterus and bilateral adnexa are unremarkable. Other: Small volume ascites.  Mild body wall subcutaneous edema. Musculoskeletal: No suspicious bony lesions visualized. IMPRESSION: 1. Hepatic cirrhosis. 2. Portal hypertension including splenomegaly and numerous large varices  mostly in the upper abdomen. 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 3. Small volume ascites. 4. Stable extrahepatic biliary ductal dilatation. 5. Chronic mild wall thickening of small bowel loops and hazy densities in the mesentery, likely related to cirrhosis and portal hypertension. Electronically Signed   By: Delaney  Williams M.D.   On: 05/09/2021 15:51  ° °DG Chest Port 1 View ° °Result Date: 05/09/2021 °CLINICAL DATA:  Altered mental status EXAM: PORTABLE CHEST 1 VIEW COMPARISON:  Chest x-ray 04/19/2021 FINDINGS: Study is significantly limited due to uncooperative patient/positioning. Left lung appears grossly clear with likely chronic mildly prominent interstitial lung markings. Heart is enlarged. No large pneumothorax visualized. IMPRESSION: As above. Electronically Signed   By: Delaney  Williams M.D.   On: 05/09/2021 13:24   ° °Scheduled Meds: ° mouth rinse  15 mL Mouth Rinse BID  ° °Continuous Infusions: ° ° LOS: 1 day  ° °Time spent: 50 mins ° ° , MD °How to contact the [facility] Attending or Consulting provider 7A - 7P or covering provider during after hours 7P -7A, for this patient?  °Check the care team in [facility]   and look for a) attending/consulting [facility] provider listed and b) the [facility] team listed °Log into [email or web address] and use [facility] universal password to access. If you do not have the password, please contact the hospital operator. °Locate the [facility] provider you are looking for under [facility] and page to a number that you can be directly reached. °If you still have difficulty reaching the provider, please page the DOC (Director on Call) for the Hospitalists listed on amion for assistance. ° °04/29/2021, 11:47 AM  ° ° °

## 2021-05-11 NOTE — Progress Notes (Signed)
°   05/16/2021 0134  Assess: MEWS Score  Temp 98.7 F (37.1 C)  BP (!) 121/55  Pulse Rate (!) 104  Resp (!) 33  SpO2 91 %  O2 Device Nasal Cannula  O2 Flow Rate (L/min) 3.5 L/min  Assess: MEWS Score  MEWS Temp 0  MEWS Systolic 0  MEWS Pulse 1  MEWS RR 2  MEWS LOC 2  MEWS Score 5  MEWS Score Color Red

## 2021-05-11 NOTE — Assessment & Plan Note (Signed)
Full comfort measures

## 2021-05-11 NOTE — Progress Notes (Signed)
Patient respiratory rate elevated, labored breathing. Patient O2 Sat 92% on 3.5L . Patient jaundice, responsive to pain. HOB elevated. MD aware of MEWS and updated vital signs. Breathing treatments ordered. MD stated unable to assess patient at this time.

## 2021-05-11 NOTE — TOC Progression Note (Addendum)
Transition of Care [facility]) - Progression Note    Patient Details  Name: [name] MRN: [id number] Date of Birth: 09-28-1965  Transition of Care [facility]) CM/SW Contact  Karn Cassis, [place] Phone Number: May 16, 2021, 10:29 AM  Clinical Narrative:  Pt's significant other, Remi Deter is requesting pt return home with hospice services through [facility] Referral made. MD aware to enter hospice order at [facility] request. Hospice will order hospital bed and home O2 and then notify TOC when DME is delivered so transportation can be arranged.    Update: Per Melissa at [facility], there is concern about pt returning home due to O2 with them using kerosene to heat the house and pt's significant other works 12 hour shifts and pt would be alone. Discussed this with Remi Deter and he understands. Agreeable to refer to [facility] for possible GIP as no residential beds currently available. Referral made to [facility]. MD updated.       Barriers to Discharge: Continued Medical Work up  Expected Discharge Plan and Services           Expected Discharge Date: 05/16/21                                     Social Determinants of Health (SDOH) Interventions    Readmission Risk Interventions Readmission Risk Prevention Plan 04/06/2021 07/27/2020 07/20/2020  Transportation Screening Complete - Complete  PCP or Specialist Appt within 3-5 Days - Complete -  HRI or Home Care Consult Complete - Complete  Social Work Consult for Recovery Care Planning/Counseling Complete - Complete  Palliative Care Screening Not Applicable - Not Applicable  Medication Review Oceanographer) Complete - Complete  Some recent data might be hidden

## 2021-05-11 NOTE — Assessment & Plan Note (Addendum)
Full comfort measures under GIP status  Continue pain management as ordered, titrate for comfort.  Full comfort order-set initiated.

## 2021-05-11 NOTE — Discharge Summary (Signed)
Physician Discharge Summary   Patient: [name] MRN: [id number] DOB: [birth year]  Admit date:     05/09/2021  Discharge date: 04/22/2021  Discharge Physician: Irwin Brakeman   PCP: [facility] [place]   Discharge Diagnoses: Principal Problem:   Acute hepatic encephalopathy Active Problems:   Alcoholic cirrhosis ([place])   Lactic acidosis   Pancytopenia ([place])   DNR (do not resuscitate)   Subdural hematoma   Thrombocytopenia ([place])   Essential hypertension   Type 2 diabetes mellitus ([place])  Resolved Problems:   * No resolved hospital problems. *  Hospital Course: 56 y.o. female with medical history significant for alcoholic liver cirrhosis, diabetes mellitus hypertension, COPD, chronic respiratory failure, diastolic CHF. Patient was brought to the ED with reports of altered mental status.  Patient significant other was initially present at bedside.  Patient had been to [place] on a trip for a few days and returned last night.  Last night she was a bit confused, but this morning patient the confusion was worse, with patient yelling "help me help me," and had defecated on herself.  Patient's significant other does not think patient had been taking her lactulose. On my evaluation patient is awake, but continuously yelling out in a loud voice " "Get me up".  She is not answering questions.   Recent hospitalization 1/21 to 2/6-for acute on chronic respiratory failure with hypoxia secondary to pleural effusion, decompensated CHF, improved with diuresis.  She is on 4 L at baseline.  Macrocytic anemia requiring 1 unit of PRBC.  Acute hepatic encephalopathy treated with lactulose and rifaximin.  Due to worsening bilirubin underwent MRI abdomen with MRCP, findings of chronic dilated CBD no interventions recommended.   ED Course: Temperature 97.2.  O2 sats 92 to 95% on room air.  Hemoglobin stable at 9.  Platelets low at 52.  ALP elevated at 143.  Total bilirubin 18.3 consistent with prior.   Lactic acid 2.7.  Chest x-ray-Limited due to uncooperativeness, no noticeable abnormality. 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 Head CTSmall subacute to chronic subdural hematoma in the left frontoparietal convexity. Minimal effacement of the adjacent brain. 2. No acute hemorrhage or herniation.   Oral lactulose ordered in ED, patient refused orals.  Geodone10 mg also given for imaging, without significant improvement in her mental status.  05/10/2021:  Pt remains completely obtunded, unable to take oral lactulose, ammonia remains elevated and unchanged despite lactulose enemas.  Changed to DNR (significant other brought in DNR paperwork).   05/07/2021:  Pt continues to decline.  No meaningful improvement.  Having increasing respiratory distress. Ammonia rising precipitously despite lactulose enemas.  I met with spouse at bedside and he says patient wanted to die at home and he wanted to make patient comfortable.  Unfortunately due to his work schedule patient would be alone at home and hospice not able to accept her for home care.  Pt anticipated to expire in days.  No oral intake in last 48 hours.  Comfort care orders started.  Referral requested for residential hospice care and spouse is agreeable.    Hospice evaluated and felt patient is too terminally ill to survive transfer to facility. Pt placed in GIP status.    Assessment and Plan: * Acute hepatic encephalopathy- (present on admission) Acute metabolic encephalopathy, appears to be hepatic in etiology.  Significant other reports patient NOT taking lactulose over the past few days.  Ammonia level -  elevated at 65. Blood alcohol level, <10.  Head CT showing 4 mm subacute to chronic hematoma in the left frontoparietal convexity- I talked to neurosurgery unlikely cause of patient's altered mental status. ABD CT-without acute  findings, shows splenomegaly, numerous large varices in the upper abdomen, stable biliary ductal dilatation.  Patient refusing oral lactulose at this time and too obtunded to swallow.  Continue lactulose enemas.  - rectal tube ordered - UDS negative. - AMMONIA CONTINUES TO RISE PRECIPITOUSLY DESPITE LACTULOSE ENEMAS.  05/18/2021: Pt transitioning to full comfort measures.  Expect death in days.   Subdural hematoma- (present on admission) CT findings- 32m Small subacute to chronic subdural hematoma in the left frontoparietal convexity. Minimal effacement of the adjacent brain. 2. No acute hemorrhage or herniation. -Not on anticoagulation, but has chronic thrombocytopenia platelets in the 50s. -I talked to neurosurgery nurse practitioner on-call KOneal Deputyis small so unlikely etiology of patient's altered mental status.  [facility] morning. Repeat CT head ordered 2/20 WITH NO CHANGE IN SIZE OF HEMATOMA.  05/10/2021: Pt transitioning to full comfort measures.  Expect death in days.   DNR (do not resuscitate)- (present on admission) Significant other brought in her DNR paperwork and was DNR order placed 2/20.  04/27/2021: Pt transitioning to full comfort measures.  Expect death in days.   P[place]top[place]- (present on admission) Due to end stage liver disease. 04/29/2021: Pt transitioning to full comfort measures.  Expect death in days.    Lactic acidosis- (present on admission) Lactic acid 2.7.  At this time no focus of infection identified.  No concern for sepsis.  SEPSIS RULED OUT 05/17/2021: Pt transitioning to full comfort measures.  Expect death in days.   Alcoholic cirrhosis ([place]- (present on admission) Chronically elevated total bilirubin of 18.  With pancytopenia, platelets of 50s, Hgb stable at 9. INR elevated at 3.2.  Pt appears very ill.  Palliative care consultation requested.  ALP elevated at 143 new, abdominal CT  -stable extrahepatic biliary ductal dilatation. -Follow CMP -Mag -Repleted 05/10/2021: Pt transitioning to full comfort measures.  Expect death in days.  Unable to take anything oral.   [place]- (present on admission) Due to end stage liver disease  Type 2 diabetes mellitus ([place] 04/21/2021:  Pt transitioning to full comfort measures   Essential hypertension- (present on admission) Unable to take anything p.o. at this time.  04/23/2021: Pt transitioning to full comfort measures.  Expect death in days.        Consultants: palliative care  Procedures performed:   Disposition: Hospice care Diet recommendation:  NPO     Discharge Exam: Filed Weights   05/09/21 1830  Weight: 51.5 kg   General exam: pt appears acutely ill and chronically ill at same time. She is encephalopathic foaming at the mouth.  She is severely jaundiced.  Pt appears terminally ill.  Respiratory system: shallow breathing bilaterally with coarse sounds and gasping for air intermittently.  Cardiovascular system: normal S1 & S2 heard.  Gastrointestinal system: Abdomen is distended, soft. Grimacing with exam of LUQ.  Central nervous system: obtunded/ encephalopathic. Extremities: moving all extremities spontaneously. Skin: severely jaundiced. Psychiatry: unable to determine  Condition at discharge:  HOSPICE   The results of significant diagnostics from this hospitalization (including imaging, microbiology, ancillary and laboratory) are listed below for reference.   Imaging Studies: CT HEAD WO CONTRAST (5MM)  Result Date: 05/10/2021 CLINICAL DATA:  Provided history: Subdural hematoma. Follow-up  subdural hematoma. EXAM: CT HEAD WITHOUT CONTRAST TECHNIQUE: Contiguous axial images were obtained from the base of the skull through the vertex without intravenous contrast. RADIATION DOSE REDUCTION: This exam was performed according to the departmental dose-optimization program which includes automated  exposure control, adjustment of the mA and/or kV according to patient size and/or use of iterative reconstruction technique. COMPARISON:  Prior head CT examinations 05/09/2021 and earlier. FINDINGS: Brain: Mildly motion degraded exam. Redemonstrated intermediate-to-low density, subacute-to-chronic subdural hematoma overlying the left cerebral hemisphere. Unchanged from the head CT performed yesterday, this hematoma measures up to 5 mm in thickness (remeasured on prior). As before, there is minimal mass effect upon the underlying left cerebral hemisphere with trace rightward midline shift. No demarcated cortical infarct. No evidence of an intracranial mass. No midline shift. Vascular: No hyperdense vessel. Skull: Normal. Negative for fracture or focal lesion. Sinuses/Orbits: Visualized orbits show no acute finding. No significant paranasal sinus disease. IMPRESSION: Mildly motion degraded exam. Unchanged subacute-to-chronic subdural hematoma overlying left cerebral hemisphere, measuring up to 5 mm. As before, there is minimal mass effect upon the underlying left cerebral hemisphere with trace rightward midline shift. Electronically Signed   By: Kellie Simmering D.O.   On: 05/10/2021 14:01   CT Head Wo Contrast  Result Date: 05/09/2021 CLINICAL DATA:  Altered mental status EXAM: CT HEAD WITHOUT CONTRAST TECHNIQUE: Contiguous axial images were obtained from the base of the skull through the vertex without intravenous contrast. RADIATION DOSE REDUCTION: This exam was performed according to the departmental dose-optimization program which includes automated exposure control, adjustment of the mA and/or kV according to patient size and/or use of iterative reconstruction technique. COMPARISON:  CT head 03/16/2021 FINDINGS: Somewhat limited due to patient positioning and condition. Brain: No acute intraparenchymal hematoma. There is a low-density extra-axial likely subacute to chronic subdural hematoma on the left which  measures up to 4 mm in thickness in the frontal convexity. Minimal mass effect on the adjacent brain. No evidence of herniation. No hydrocephalus. Vascular: No hyperdense vessel or unexpected calcification. Skull: Normal. Negative for fracture or focal lesion. Sinuses/Orbits: No acute finding. Other: None. IMPRESSION: 1. Small subacute to chronic subdural hematoma in the left frontoparietal convexity. Minimal effacement of the adjacent brain. 2. No acute hemorrhage or herniation. Findings discussed with Dr. Almyra Free over the telephone at 3:57 p.m. on 05/09/2021 with read back. Electronically Signed   By: Ofilia Neas M.D.   On: 05/09/2021 15:59   CT CHEST WO CONTRAST  Result Date: 04/20/2021 CLINICAL DATA:  Pneumonia, complication suspected. EXAM: CT CHEST WITHOUT CONTRAST TECHNIQUE: Multidetector CT imaging of the chest was performed following the standard protocol without IV contrast. RADIATION DOSE REDUCTION: This exam was performed according to the departmental dose-optimization program which includes automated exposure control, adjustment of the mA and/or kV according to patient size and/or use of iterative reconstruction technique. COMPARISON:  04/11/2021. FINDINGS: Cardiovascular: The heart is enlarged and there is no pericardial effusion. Scattered coronary artery calcifications are noted. There is atherosclerotic calcification of the aorta without evidence of aneurysm. The pulmonary trunk is normal in caliber. Mediastinum/Nodes: Shotty lymph nodes are present in the mediastinum. No axillary lymphadenopathy. Evaluation of the hila is limited due to lack of IV contrast. Surgical clips are present in the thyroid bed on the left. Hyperdense and hypodense nodules and calcifications are present in the right lobe of the thyroid gland. The trachea and esophagus are within normal limits. Lungs/Pleura: Patchy ground-glass opacities are present in the lungs bilaterally. There is consolidation  in the lower lobes  bilaterally. There are small to moderate bilateral pleural effusions. No pneumothorax is seen. Upper Abdomen: Cirrhotic changes are noted in the liver. A cystic lesion is present in the right lobe of the liver measuring 1.2 cm. The gallbladder is surgically absent. The spleen is enlarged. Mesenteric edema is noted and there is a small amount ascites in the right upper quadrant. Musculoskeletal: Anasarca is noted. A stable compression deformity is noted in the superior endplate of T7. No acute osseous abnormality. IMPRESSION: 1. Patchy ground-glass opacities in the lungs bilaterally with consolidation in the lower lobes, concerning for pneumonia versus edema. 2. Small to moderate bilateral pleural effusions. 3. Cardiomegaly with coronary artery calcifications. 4. Morphologic changes of cirrhosis and portal hypertension. 5. Small ascites in the right upper quadrant and anasarca. 6. Aortic atherosclerosis. Electronically Signed   By: Brett Fairy M.D.   On: 04/20/2021 23:29   CT Abdomen Pelvis W Contrast  Result Date: 05/09/2021 CLINICAL DATA:  Abdominal pain EXAM: CT ABDOMEN AND PELVIS WITH CONTRAST TECHNIQUE: Multidetector CT imaging of the abdomen and pelvis was performed using the standard protocol following bolus administration of intravenous contrast. RADIATION DOSE REDUCTION: This exam was performed according to the departmental dose-optimization program which includes automated exposure control, adjustment of the mA and/or kV according to patient size and/or use of iterative reconstruction technique. CONTRAST:  152m OMNIPAQUE IOHEXOL 300 MG/ML  SOLN COMPARISON:  CT abdomen and pelvis 07/19/2020 FINDINGS: Lower chest: Cardiomegaly. Subsegmental atelectatic changes in the lung bases. Hepatobiliary: Diffuse nodularity of the liver with inhomogeneous parenchyma consistent with cirrhosis. Interval enlargement of a 13 mm well-defined hypodensity in the right hepatic lobe which likely represents a cyst. There is  a faint ill-defined hypodensity near the dome of the right hepatic lobe measuring approximately 1 cm, not definitely seen on previous CT and no abnormality visualized in the region on recent MRI. Gallbladder is surgically absent. Extrahepatic biliary ductal dilatation similar to previous study, the common bile duct measures 16 mm in diameter. No significant intrahepatic ductal dilatation. Pancreas: Atrophic with no suspicious mass or ductal dilatation visualized. Spleen: Enlarged measuring 14.7 cm in length. Adrenals/Urinary Tract: No adrenal nodules identified. A few small renal cortical cysts visualized bilaterally. No hydronephrosis or definite enhancing renal mass identified. Urinary bladder appears grossly normal. Stomach/Bowel: No bowel obstruction, free air or pneumatosis. Stable chronic mild wall thickening throughout the small bowel with congestive appearance and accompanying hazy mesenteric stranding densities. No bowel wall edema identified. No evidence of acute appendicitis. Vascular/Lymphatic: Numerous large varices are again seen mostly in the upper mid abdomen and left upper quadrant, measuring up to 2.5 cm in diameter. Cavernous transformation of the portal vein again seen. No definite bulky lymphadenopathy identified. Reproductive: Uterus and bilateral adnexa are unremarkable. Other: Small volume ascites.  Mild body wall subcutaneous edema. Musculoskeletal: No suspicious bony lesions visualized. IMPRESSION: 1. Hepatic cirrhosis. 2. Portal hypertension including splenomegaly and numerous large varices mostly in the upper abdomen. 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 3. Small volume ascites. 4. Stable extrahepatic biliary ductal dilatation. 5. Chronic mild wall thickening of small bowel loops and hazy densities in the mesentery, likely related to cirrhosis and portal hypertension. Electronically Signed   By: DOfilia NeasM.D.   On: 05/09/2021 15:51   DG Chest Port 1  View  Result Date: 05/09/2021 CLINICAL DATA:  Altered mental status EXAM: PORTABLE CHEST 1 VIEW COMPARISON:  Chest x-ray 04/19/2021 FINDINGS: Study is significantly limited due to uncooperative patient/positioning. Left  lung appears grossly clear with likely chronic mildly prominent interstitial lung markings. Heart is enlarged. No large pneumothorax visualized. IMPRESSION: As above. Electronically Signed   By: Ofilia Neas M.D.   On: 05/09/2021 13:24   DG Abd 2 Views  Result Date: 04/22/2021 CLINICAL DATA:  Abdominal pain EXAM: ABDOMEN - 2 VIEW COMPARISON:  02/25/2020 cholecystectomy clips FINDINGS: Are identified in the right upper quadrant of the abdomen. The bowel gas pattern is normal. There is no evidence of free air. No radio-opaque calculi or other significant radiographic abnormality is seen. Pleural effusions and bilateral pulmonary opacities are identified within the visualized portions of the lung bases. IMPRESSION: 1. Normal bowel gas pattern. 2. Previous cholecystectomy. 3. Bilateral pleural effusions and bilateral pulmonary opacities. Electronically Signed   By: Kerby Moors M.D.   On: 04/22/2021 13:51   MR ABDOMEN MRCP W WO CONTAST  Result Date: 04/22/2021 CLINICAL DATA:  Jaundice, elevated bilirubin, NASH cirrhosis, ETOH EXAM: MRI ABDOMEN WITHOUT AND WITH CONTRAST (INCLUDING MRCP) TECHNIQUE: Multiplanar multisequence MR imaging of the abdomen was performed both before and after the administration of intravenous contrast. Heavily T2-weighted images of the biliary and pancreatic ducts were obtained, and three-dimensional MRCP images were rendered by post processing. CONTRAST:  6.52m GADAVIST GADOBUTROL 1 MMOL/ML IV SOLN COMPARISON:  Right upper quadrant ultrasound dated 04/06/2021. CT abdomen/pelvis dated 07/19/2020. FINDINGS: Motion degraded images. Lower chest: Small bilateral pleural effusions. Hepatobiliary: Cirrhosis. 14 mm cyst in the posterior right hepatic lobe (series 4/image  12). No suspicious/enhancing hepatic lesions. Status post cholecystectomy. Mild central intrahepatic ductal prominence (series 4/image 12), unchanged. Dilated common duct, measuring up to 17 mm (series 4/image 16), unchanged. No choledocholithiasis is seen. Pancreas: Grossly unremarkable. No peripancreatic fluid collection/pseudocyst. Spleen: Splenomegaly, measuring 15.7 cm in maximal craniocaudal dimension. Adrenals/Urinary Tract:  Adrenal glands are within normal limits. Subcentimeter left upper pole renal cyst (series 4/image 21). Right kidney is within normal limits. No hydronephrosis. Stomach/Bowel: Stomach is within normal limits. Visualized bowel is grossly unremarkable. Vascular/Lymphatic: No evidence of abdominal aortic aneurysm. 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 Perisplenic varices with splenorenal shunt. No suspicious abdominal lymphadenopathy. Other:  Small volume abdominal ascites. Body wall edema. Musculoskeletal: No focal osseous lesions. IMPRESSION: Cirrhosis.  No findings suspicious for HCC. Status post cholecystectomy. Stable dilatation of the common duct, measuring up to 17 mm. No choledocholithiasis is seen. Consider ERCP as clinically warranted to exclude distal CBD stricture. Splenomegaly. Small volume abdominal ascites. 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 Small bilateral pleural effusions.  Body wall edema. Electronically Signed   By: SJulian HyM.D.   On: 04/22/2021 22:44   ECHOCARDIOGRAM COMPLETE  Result Date: 04/21/2021    ECHOCARDIOGRAM REPORT   Patient Name:   DYORLEY BUCHDate of Exam: 04/21/2021 Medical Rec #:  [id number]        Height:       63.0 in Accession #:    [id number]       Weight:       144.7 lb Date of Birth:  [birth year]        BSA:          1.685 m Patient Age:    [age]years          BP:           121/55 mmHg Patient Gender: F  HR:           78 bpm. Exam  Location:  Inpatient Procedure: 2D Echo, Cardiac Doppler, Color Doppler and Strain Analysis Indications:    CHF  History:        Patient has prior history of Echocardiogram examinations, most                 recent 07/25/2020. COPD, Signs/Symptoms:Chest Pain and Murmur;                 Risk Factors:Diabetes and Hypertension.  Sonographer:    [facility] Referring Phys: [id number] Lequita Halt IMPRESSIONS  1. Left ventricular ejection fraction, by estimation, is 60 to 65%. The left ventricle has normal function. The left ventricle has no regional wall motion abnormalities. There is mild concentric left ventricular hypertrophy. Left ventricular diastolic parameters are indeterminate.  2. Right ventricular systolic function is normal. The right ventricular size is mildly enlarged. There is moderately elevated pulmonary artery systolic pressure.  3. Left atrial size was moderately dilated.  4. Right atrial size was mildly dilated.  5. The mitral valve is normal in structure. Moderate mitral valve regurgitation. No evidence of mitral stenosis.  6. Tricuspid valve regurgitation is mild to moderate.  7. The aortic valve is tricuspid. Aortic valve regurgitation is not visualized. Mild aortic valve stenosis. Aortic valve area, by VTI measures 1.80 cm. Aortic valve mean gradient measures 15.7 mmHg. Aortic valve Vmax measures 2.62 m/s.  8. The inferior vena cava is dilated in size with <50% respiratory variability, suggesting right atrial pressure of 15 mmHg. FINDINGS  Left Ventricle: Left ventricular ejection fraction, by estimation, is 60 to 65%. The left ventricle has normal function. The left ventricle has no regional wall motion abnormalities. Global longitudinal strain performed but not reported based on interpreter judgement due to suboptimal tracking. The left ventricular internal cavity size was normal in size. There is mild concentric left ventricular hypertrophy. Left ventricular diastolic function could not be evaluated due to  mitral regurgitation (moderate or greater). Left ventricular diastolic parameters are indeterminate. Right Ventricle: The right ventricular size is mildly enlarged. No increase in right ventricular wall thickness. Right ventricular systolic function is normal. There is moderately elevated pulmonary artery systolic pressure. The tricuspid regurgitant velocity is 3.02 m/s, and with an assumed right atrial pressure of 15 mmHg, the estimated right ventricular systolic pressure is 54.5 mmHg. Left Atrium: Left atrial size was moderately dilated. Right Atrium: Right atrial size was mildly dilated. Pericardium: There is no evidence of pericardial effusion. Mitral Valve: The mitral valve is normal in structure. Moderate mitral valve regurgitation. No evidence of mitral valve stenosis. Tricuspid Valve: The tricuspid valve is normal in structure. Tricuspid valve regurgitation is mild to moderate. No evidence of tricuspid stenosis. Aortic Valve: The aortic valve is tricuspid. Aortic valve regurgitation is not visualized. Mild aortic stenosis is present. Aortic valve mean gradient measures 15.7 mmHg. Aortic valve peak gradient measures 27.5 mmHg. Aortic valve area, by VTI measures 1.80 cm. Pulmonic Valve: The pulmonic valve was normal in structure. Pulmonic valve regurgitation is not visualized. No evidence of pulmonic stenosis. Aorta: The aortic root is normal in size and structure. Venous: The inferior vena cava is dilated in size with less than 50% respiratory variability, suggesting right atrial pressure of 15 mmHg. IAS/Shunts: No atrial level shunt detected by color flow Doppler.  LEFT VENTRICLE PLAX 2D LVIDd:         5.10 cm      Diastology LVIDs:  2.80 cm      LV e' medial:    6.42 cm/s LV PW:         1.10 cm      LV E/e' medial:  21.3 LV IVS:        1.00 cm      LV e' lateral:   9.79 cm/s LVOT diam:     2.00 cm      LV E/e' lateral: 14.0 LV SV:         100 LV SV Index:   59 LVOT Area:     3.14 cm  LV Volumes  (MOD) LV vol d, MOD A2C: 109.0 ml LV vol d, MOD A4C: 123.0 ml LV vol s, MOD A2C: 29.3 ml LV vol s, MOD A4C: 50.2 ml LV SV MOD A2C:     79.7 ml LV SV MOD A4C:     123.0 ml LV SV MOD BP:      82.2 ml RIGHT VENTRICLE RV Basal diam:  4.60 cm RV Mid diam:    3.50 cm RV S prime:     24.40 cm/s TAPSE (M-mode): 3.2 cm LEFT ATRIUM             Index        RIGHT ATRIUM           Index LA diam:        3.40 cm 2.02 cm/m   RA Area:     18.10 cm LA Vol (A2C):   59.9 ml 35.55 ml/m  RA Volume:   50.00 ml  29.67 ml/m LA Vol (A4C):   84.3 ml 50.03 ml/m LA Biplane Vol: 72.9 ml 43.26 ml/m  AORTIC VALVE                     PULMONIC VALVE AV Area (Vmax):    1.81 cm      PV Vmax:       1.51 m/s AV Area (Vmean):   1.78 cm      PV Vmean:      86.500 cm/s AV Area (VTI):     1.80 cm      PV VTI:        0.249 m AV Vmax:           262.33 cm/s   PV Peak grad:  9.1 mmHg AV Vmean:          184.000 cm/s  PV Mean grad:  4.0 mmHg AV VTI:            0.553 m AV Peak Grad:      27.5 mmHg AV Mean Grad:      15.7 mmHg LVOT Vmax:         151.00 cm/s LVOT Vmean:        104.300 cm/s LVOT VTI:          0.317 m LVOT/AV VTI ratio: 0.57  AORTA Ao Root diam: 2.50 cm Ao Asc diam:  2.90 cm MITRAL VALVE                 TRICUSPID VALVE MV Area (PHT): 3.06 cm      TR Peak grad:   36.5 mmHg MV Decel Time: 248 msec      TR Vmax:        302.00 cm/s MR Peak grad:   117.9 mmHg MR Mean grad:   80.0 mmHg    SHUNTS MR Vmax:        543.00 cm/s  Systemic VTI:  0.32 m MR Vmean:       421.0 cm/s   Systemic Diam: 2.00 cm MR PISA:        1.01 cm MR PISA Radius: 0.40 cm MV E velocity: 137.00 cm/s MV A velocity: 69.20 cm/s MV E/A ratio:  1.98 Kardie Tobb DO Electronically signed by Berniece Salines DO Signature Date/Time: 04/21/2021/5:27:36 PM    Final    [place] ASCITES (ABDOMEN LIMITED)  Result Date: 04/13/2021 CLINICAL DATA:  Cirrhosis, ascites, ascites check for paracentesis EXAM: LIMITED ABDOMEN ULTRASOUND FOR ASCITES TECHNIQUE: Limited ultrasound survey for ascites was  performed in all four abdominal quadrants. COMPARISON:  04/09/2021 FINDINGS: Only a small amount of perihepatic ascites is identified. Minimal ascites in RIGHT upper quadrant and RIGHT gutter. Insufficient ascites for paracentesis. Nodular hepatic margins consistent with history of cirrhosis. IMPRESSION: Insufficient ascites for paracentesis. Electronically Signed   By: Lavonia Dana M.D.   On: 04/13/2021 15:21    Microbiology: Results for orders placed or performed during the hospital encounter of 05/09/21  Resp Panel by RT-PCR (Flu A&B, Covid) Nasopharyngeal Swab     Status: None   Collection Time: 05/09/21 11:46 AM   Specimen: Nasopharyngeal Swab; Nasopharyngeal(NP) swabs in vial transport medium  Result Value Ref Range Status   SARS Coronavirus 2 by RT PCR NEGATIVE NEGATIVE Final    Comment: (NOTE) SARS-CoV-2 target nucleic acids are NOT DETECTED.  The SARS-CoV-2 RNA is generally detectable in upper respiratory specimens during the acute phase of infection. The lowest concentration of SARS-CoV-2 viral copies this assay can detect is 138 copies/mL. A negative result does not preclude SARS-Cov-2 infection and should not be used as the sole basis for treatment or other patient management decisions. A negative result may occur with  improper specimen collection/handling, submission of specimen other than nasopharyngeal swab, presence of viral mutation(s) within the areas targeted by this assay, and inadequate number of viral copies(<138 copies/mL). A negative result must be combined with clinical observations, patient history, and epidemiological information. The expected result is Negative.  Fact Sheet for Patients:  EntrepreneurPulse.com.au  Fact Sheet for Healthcare Providers:  IncredibleEmployment.be  This test is no t yet approved or cleared by the [place] FDA and  has been authorized for detection and/or diagnosis of SARS-CoV-2 by FDA  under an Emergency Use Authorization (EUA). This EUA will remain  in effect (meaning this test can be used) for the duration of the COVID-19 declaration under Section 564(b)(1) of the Act, 21 U.S.C.section 360bbb-3(b)(1), unless the authorization is terminated  or revoked sooner.       Influenza A by PCR NEGATIVE NEGATIVE Final   Influenza B by PCR NEGATIVE NEGATIVE Final    Comment: (NOTE) The Xpert Xpress SARS-CoV-2/FLU/RSV plus assay is intended as an aid in the diagnosis of influenza from Nasopharyngeal swab specimens and should not be used as a sole basis for treatment. Nasal washings and aspirates are unacceptable for Xpert Xpress SARS-CoV-2/FLU/RSV testing.  Fact Sheet for Patients: EntrepreneurPulse.com.au  Fact Sheet for Healthcare Providers: IncredibleEmployment.be  This test is not yet approved or cleared by the [place] FDA and has been authorized for detection and/or diagnosis of SARS-CoV-2 by FDA under an Emergency Use Authorization (EUA). This EUA will remain in effect (meaning this test can be used) for the duration of the COVID-19 declaration under Section 564(b)(1) of the Act, 21 U.S.C. section 360bbb-3(b)(1), unless the authorization is terminated or revoked.  Performed at [facility], [address], [place], [place] [address]  Culture, blood (routine x 2)     Status: None (Preliminary result)   Collection Time: 05/09/21 12:51 PM   Specimen: BLOOD RIGHT HAND  Result Value Ref Range Status   Specimen Description BLOOD RIGHT HAND BOTTLES DRAWN AEROBIC ONLY  Final   Special Requests Blood Culture adequate volume  Final   Culture   Final    NO GROWTH 2 DAYS Performed at [facility], [address] [place], [place] [address]    Report Status PENDING  Incomplete  Culture, blood (routine x 2)     Status: None (Preliminary result)   Collection Time: 05/09/21 12:51 PM   Specimen: BLOOD RIGHT WRIST  Result Value Ref  Range Status   Specimen Description   Final    BLOOD RIGHT WRIST BOTTLES DRAWN AEROBIC AND ANAEROBIC   Special Requests Blood Culture adequate volume  Final   Culture   Final    NO GROWTH 2 DAYS Performed at [facility], [address] [place], [place] [address]    Report Status PENDING  Incomplete    Labs: CBC: Recent Labs  Lab 05/09/21 1125 05/10/21 0419 05/05/2021 0338  WBC 3.4* 3.5* 5.5  HGB 9.0* 8.6* 8.2*  HCT 27.3* 26.2* 26.2*  MCV 108.3* 110.1* 113.9*  PLT 52* 54* 66*   Basic Metabolic Panel: Recent Labs  Lab 05/09/21 1125 05/09/21 1251 05/10/21 0419 05/10/2021 0338  NA 131*  --  133* 140  K 4.0  --  3.7 4.5  CL 95*  --  101 107  CO2 27  --  27 23  GLUCOSE 299*  --  127* 134*  BUN 16  --  15 30*  CREATININE 0.54  --  0.42* 0.85  CALCIUM 9.1  --  8.3* 8.4*  MG  --  1.5* 2.0 2.0  PHOS  --  3.0  --   --    Liver Function Tests: Recent Labs  Lab 05/09/21 1125 05/10/21 0419 05/13/2021 0338  AST 61* 56* 82*  ALT 29 25 29   ALKPHOS 143* 104 98  BILITOT 18.3* 18.5* 18.9*  PROT 6.4* 5.7* 5.4*  ALBUMIN 2.6* 2.4* 2.3*   CBG: Recent Labs  Lab 05/10/21 1121 05/10/21 1607 05/10/21 2228 05/02/2021 0307 05/02/2021 0728  GLUCAP 161* 153* 133* 142* 146*   Discharge time spent: less than 30 minutes.  Signed: Irwin Brakeman, MD [facility] 05/09/2021

## 2021-05-11 NOTE — Assessment & Plan Note (Signed)
Pt admitted under GIP for full comfort care measures.

## 2021-05-11 NOTE — Progress Notes (Signed)
05/16/2021 9:57 AM  I had a long discussion with her spouse this morning.  Pt continuing to decline despite treatments. Ammonia continuing to climb. Pt having more respiratory distress, appears very uncomfortable and terminally ill.  I offered comfort focused care and residential hospice referral. He says patient does not want to die at hospital and wants to die at home.  He wants to take patient home and have home hospice support.  Also he requested a hospital bed.  I have reached out to [facility] about arranging home hospice.  Also reached out to GI to cancel consult.  Comfort orders initiated.  Full note to follow.   Murvin Natal, MD

## 2021-05-12 DIAGNOSIS — Z7189 Other specified counseling: Secondary | ICD-10-CM

## 2021-05-12 DIAGNOSIS — K7210 Chronic hepatic failure without coma: Secondary | ICD-10-CM

## 2021-05-12 DIAGNOSIS — K7682 Hepatic encephalopathy: Secondary | ICD-10-CM

## 2021-05-12 DIAGNOSIS — Z515 Encounter for palliative care: Principal | ICD-10-CM

## 2021-05-12 DIAGNOSIS — Z66 Do not resuscitate: Secondary | ICD-10-CM

## 2021-05-12 MED ORDER — GLYCOPYRROLATE 0.2 MG/ML IJ SOLN: 0.2000 mg | INTRAMUSCULAR | Status: DC | PRN

## 2021-05-12 MED ORDER — ONDANSETRON HCL 4 MG/2ML IJ SOLN: 4.0000 mg | Freq: Four times a day (QID) | INTRAMUSCULAR | Status: DC | PRN

## 2021-05-12 MED ORDER — HALOPERIDOL LACTATE 5 MG/ML IJ SOLN: 0.5000 mg | INTRAMUSCULAR | Status: DC | PRN

## 2021-05-12 MED ORDER — POLYVINYL ALCOHOL 1.4 % OP SOLN: 1.0000 [drp] | Freq: Four times a day (QID) | OPHTHALMIC | Status: DC | PRN

## 2021-05-12 MED ORDER — HALOPERIDOL LACTATE 2 MG/ML PO CONC
0.5000 mg | ORAL | Status: DC | PRN
  Filled 2021-05-12: qty 0.3

## 2021-05-12 MED ORDER — GLYCOPYRROLATE 1 MG PO TABS: 1.0000 mg | ORAL_TABLET | ORAL | Status: DC | PRN

## 2021-05-12 MED ORDER — ACETAMINOPHEN 650 MG RE SUPP: 650.0000 mg | Freq: Four times a day (QID) | RECTAL | Status: DC | PRN

## 2021-05-12 MED ORDER — BIOTENE DRY MOUTH MT LIQD: 15.0000 mL | OROMUCOSAL | Status: DC | PRN

## 2021-05-12 MED ORDER — HALOPERIDOL 0.5 MG PO TABS: 0.5000 mg | ORAL_TABLET | ORAL | Status: DC | PRN

## 2021-05-12 MED ORDER — ACETAMINOPHEN 325 MG PO TABS: 650.0000 mg | ORAL_TABLET | Freq: Four times a day (QID) | ORAL | Status: DC | PRN

## 2021-05-12 MED ORDER — ONDANSETRON 4 MG PO TBDP: 4.0000 mg | ORAL_TABLET | Freq: Four times a day (QID) | ORAL | Status: DC | PRN

## 2021-05-12 NOTE — Progress Notes (Signed)
PROGRESS NOTE    [name]  ASN:[id number] DOB: June 16, 1965 DOA: 04/24/2021 [facility]CP: [facility]   Brief Narrative:   [name] 56 y.o. female with medical history significant for alcoholic liver cirrhosis, diabetes mellitus hypertension, COPD, chronic respiratory failure, diastolic CHF. Patient was brought to the ED with reports of altered mental status.  Patient significant other was initially present at bedside.  Patient had been to [place] on a trip for a few days and returned last night.  Last night she was a bit confused, but this morning patient the confusion was worse, with patient yelling "help me help me," and had defecated on herself.  Patient's significant other does not think patient had been taking her lactulose. On my evaluation patient is awake, but continuously yelling out in a loud voice " "Get me up".  She is not answering questions.   Recent hospitalization 1/21 to 2/6-for acute on chronic respiratory failure with hypoxia secondary to pleural effusion, decompensated CHF, improved with diuresis.  She is on 4 L at baseline.  Macrocytic anemia requiring 1 unit of PRBC.  Acute hepatic encephalopathy treated with lactulose and rifaximin.  Due to worsening bilirubin underwent MRI abdomen with MRCP, findings of chronic dilated CBD no interventions recommended.   ED Course: Temperature 97.2.  O2 sats 92 to 95% on room air.  Hemoglobin stable at 9.  Platelets low at 52.  ALP elevated at 143.  Total bilirubin 18.3 consistent with prior.  Lactic acid 2.7.  Chest x-ray-Limited due to uncooperativeness, no noticeable abnormality. 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 Head CTSmall subacute to chronic subdural hematoma in the left frontoparietal convexity. Minimal effacement of the adjacent brain. 2. No acute hemorrhage or herniation.   Oral lactulose  ordered in ED, patient refused orals.  Geodone10 mg also given for imaging, without significant improvement in her mental status.   05/10/2021:  Pt remains completely obtunded, unable to take oral lactulose, ammonia remains elevated and unchanged despite lactulose enemas.  Changed to DNR (significant other brought in DNR paperwork).    05/18/2021:  Pt continues to decline.  No meaningful improvement.  Having increasing respiratory distress. Ammonia rising precipitously despite lactulose enemas.  I met with spouse at bedside and he says patient wanted to die at home and he wanted to make patient comfortable.  Unfortunately due to his work schedule patient would be alone at home and hospice not able to accept her for home care.  Pt anticipated to expire in days.  No oral intake in last 48 hours.  Comfort care orders started.  Referral requested for residential hospice care and spouse is agreeable.     Hospice evaluated and felt patient is too terminally ill to survive transfer to facility. Pt placed in GIP status.    Assessment & Plan:   Principal Problem:   Comfort measures only status Active Problems:   DNR (do not resuscitate)   Acute hepatic encephalopathy   Acute hepatic encephalopathy   Comfort measures only status Full comfort measures under GIP status  Continue pain management as ordered, titrate for comfort.  Full comfort order-set initiated.    Acute hepatic encephalopathy- (present on admission) Pt admitted under GIP for full comfort care measures.     DNR (do not resuscitate)- (present on admission) Full comfort measures   DVT prophylaxis:None Code Status: DNR Family Communication: Spouse at bedside 04/2018 Disposition Plan:  Expected  in-hospital death    Consultants:  Palliative  Procedures:  None  Antimicrobials:  None   Subjective: Patient seen and evaluated today and appears comfortable overall.  Objective: Vitals:   05/10/2021 1700  Resp: (!) 28     Intake/Output Summary (Last 24 hours) at 05/12/2021 1448 Last data filed at 05/12/2021 0500 Gross per 24 hour  Intake 0 ml  Output 200 ml  Net -200 ml   There were no vitals filed for this visit.  Examination:  General exam: Appears calm and comfortable  Respiratory system: Clear to auscultation. Respiratory effort normal.  On 2 L nasal cannula Cardiovascular system: S1 & S2 heard, RRR.  Gastrointestinal system: Abdomen is soft Central nervous system: Somnolent Extremities: No edema Skin: No significant lesions noted Psychiatry: Flat affect.    Data Reviewed: I have personally reviewed following labs and imaging studies  CBC: Recent Labs  Lab 05/09/21 1125 05/10/21 0419 05/10/2021 0338  WBC 3.4* 3.5* 5.5  HGB 9.0* 8.6* 8.2*  HCT 27.3* 26.2* 26.2*  MCV 108.3* 110.1* 113.9*  PLT 52* 54* 66*   Basic Metabolic Panel: Recent Labs  Lab 05/09/21 1125 05/09/21 1251 05/10/21 0419 04/26/2021 0338  NA 131*  --  133* 140  K 4.0  --  3.7 4.5  CL 95*  --  101 107  CO2 27  --  27 23  GLUCOSE 299*  --  127* 134*  BUN 16  --  15 30*  CREATININE 0.54  --  0.42* 0.85  CALCIUM 9.1  --  8.3* 8.4*  MG  --  1.5* 2.0 2.0  PHOS  --  3.0  --   --    GFR: Estimated Creatinine Clearance: 58.7 mL/min (by C-G formula based on SCr of 0.85 mg/dL). Liver Function Tests: Recent Labs  Lab 05/09/21 1125 05/10/21 0419 05/15/2021 0338  AST 61* 56* 82*  ALT 29 25 29   ALKPHOS 143* 104 98  BILITOT 18.3* 18.5* 18.9*  PROT 6.4* 5.7* 5.4*  ALBUMIN 2.6* 2.4* 2.3*   Recent Labs  Lab 05/09/21 1251  LIPASE 31   Recent Labs  Lab 05/09/21 1125 05/10/21 0842 04/24/2021 0338  AMMONIA 60* 65* 104*   Coagulation Profile: Recent Labs  Lab 05/09/21 1125 05/10/21 1049 05/04/2021 0338  INR 3.2* 3.2* 3.3*   Cardiac Enzymes: No results for input(s): CKTOTAL, CKMB, CKMBINDEX, TROPONINI in the last 168 hours. BNP (last 3 results) No results for input(s): PROBNP in the last 8760  hours. HbA1C: No results for input(s): HGBA1C in the last 72 hours. CBG: Recent Labs  Lab 05/10/21 1121 05/10/21 1607 05/10/21 2228 04/27/2021 0307 05/02/2021 0728  GLUCAP 161* 153* 133* 142* 146*   Lipid Profile: No results for input(s): CHOL, HDL, LDLCALC, TRIG, CHOLHDL, LDLDIRECT in the last 72 hours. Thyroid Function Tests: No results for input(s): TSH, T4TOTAL, FREET4, T3FREE, THYROIDAB in the last 72 hours. Anemia Panel: No results for input(s): VITAMINB12, FOLATE, FERRITIN, TIBC, IRON, RETICCTPCT in the last 72 hours. Sepsis Labs: Recent Labs  Lab 05/09/21 1251  LATICACIDVEN 2.7*    Recent Results (from the past 240 hour(s))  Resp Panel by RT-PCR (Flu A&B, Covid) Nasopharyngeal Swab     Status: None   Collection Time: 05/09/21 11:46 AM   Specimen: Nasopharyngeal Swab; Nasopharyngeal(NP) swabs in vial transport medium  Result Value Ref Range Status   SARS Coronavirus 2 by RT PCR NEGATIVE NEGATIVE Final    Comment: (NOTE) SARS-CoV-2 target nucleic acids are NOT DETECTED.  The SARS-CoV-2 RNA  is generally detectable in upper respiratory specimens during the acute phase of infection. The lowest concentration of SARS-CoV-2 viral copies this assay can detect is 138 copies/mL. A negative result does not preclude SARS-Cov-2 infection and should not be used as the sole basis for treatment or other patient management decisions. A negative result may occur with  improper specimen collection/handling, submission of specimen other than nasopharyngeal swab, presence of viral mutation(s) within the areas targeted by this assay, and inadequate number of viral copies(<138 copies/mL). A negative result must be combined with clinical observations, patient history, and epidemiological information. The expected result is Negative.  Fact Sheet for Patients:  EntrepreneurPulse.com.au  Fact Sheet for Healthcare Providers:   IncredibleEmployment.be  This test is no t yet approved or cleared by the [place] FDA and  has been authorized for detection and/or diagnosis of SARS-CoV-2 by FDA under an Emergency Use Authorization (EUA). This EUA will remain  in effect (meaning this test can be used) for the duration of the COVID-19 declaration under Section 564(b)(1) of the Act, 21 U.S.C.section 360bbb-3(b)(1), unless the authorization is terminated  or revoked sooner.       Influenza A by PCR NEGATIVE NEGATIVE Final   Influenza B by PCR NEGATIVE NEGATIVE Final    Comment: (NOTE) The Xpert Xpress SARS-CoV-2/FLU/RSV plus assay is intended as an aid in the diagnosis of influenza from Nasopharyngeal swab specimens and should not be used as a sole basis for treatment. Nasal washings and aspirates are unacceptable for Xpert Xpress SARS-CoV-2/FLU/RSV testing.  Fact Sheet for Patients: EntrepreneurPulse.com.au  Fact Sheet for Healthcare Providers: IncredibleEmployment.be  This test is not yet approved or cleared by the [place] FDA and has been authorized for detection and/or diagnosis of SARS-CoV-2 by FDA under an Emergency Use Authorization (EUA). This EUA will remain in effect (meaning this test can be used) for the duration of the COVID-19 declaration under Section 564(b)(1) of the Act, 21 U.S.C. section 360bbb-3(b)(1), unless the authorization is terminated or revoked.  Performed at [facility], [address] [place], [place] [address]   Culture, blood (routine x 2)     Status: None (Preliminary result)   Collection Time: 05/09/21 12:51 PM   Specimen: BLOOD RIGHT HAND  Result Value Ref Range Status   Specimen Description BLOOD RIGHT HAND BOTTLES DRAWN AEROBIC ONLY  Final   Special Requests Blood Culture adequate volume  Final   Culture   Final    NO GROWTH 3 DAYS Performed at [facility], [address], [place], [place] [address]     Report Status PENDING  Incomplete  Culture, blood (routine x 2)     Status: None (Preliminary result)   Collection Time: 05/09/21 12:51 PM   Specimen: BLOOD RIGHT WRIST  Result Value Ref Range Status   Specimen Description   Final    BLOOD RIGHT WRIST BOTTLES DRAWN AEROBIC AND ANAEROBIC   Special Requests Blood Culture adequate volume  Final   Culture   Final    NO GROWTH 3 DAYS Performed at [facility], [address], [place], [place] [address]    Report Status PENDING  Incomplete         Radiology Studies: No results found.      Scheduled Meds:  mouth rinse  15 mL Mouth Rinse BID     LOS: 1 day    Time spent: 35 minutes    Maks Cavallero Darleen Crocker, DO [facility]  If 7PM-7AM, please contact night-coverage [email or web address] 05/12/2021, 2:48 PM

## 2021-05-12 NOTE — Consult Note (Signed)
Palliative Care Consult Note                                  Date: 05/12/2021   Patient Name: [name]  DOB: 23-Feb-1966  MRN: [id number]  Age / Sex: 56 y.o., female  PCP: [facility] Referring Physician: Rodena Goldmann, DO  Reason for Consultation: Terminal Care  HPI/Patient Profile: 56 y.o. female  with past medical history of alcoholic liver cirrhosis, diabetes mellitus hypertension, COPD, chronic respiratory failure, diastolic CHF. She presented to the ED with AMS. She was recently in [facility] and not taking lactulose as she should. She was admitted on 05/09/2021 with acute encephalopathy, chronic/subacute SDH, ETOH cirrhosis, pancytopenia, and others. She had continued decline despite treatment and recommended hospice. She was accepted to hospice but too sick to transfer and subsequently d/c and admitted to GIP status.   PMT was consulted for end of life and symptom management.  Past Medical History:  Diagnosis Date   Degenerative joint disease    Diabetes mellitus without complication ([place])    Gastroesophageal reflux    Pancreatitis    Panic attack    Sleep apnea    Thrombocytopenia (HCC)     Subjective:   This NP Walden Field reviewed medical records, received report from team, assessed the patient and then meet at the patient's bedside to discuss diagnosis, prognosis, GOC, EOL wishes disposition and options.  I met with the patient's significant other at the bedside. The patient is unresponsive and not able to meaningfully participate.   Concept of Palliative Care was introduced as specialized medical care for people and their families living with serious illness.  If focuses on providing relief from the symptoms and stress of a serious illness.  The goal is to improve quality of life for both the patient and the family. Values and goals of care important to patient and family were attempted to be elicited.  Created  space and opportunity for patient  and family to explore thoughts and feelings regarding current medical situation   Natural trajectory and current clinical status were discussed. Questions and concerns addressed. Patient  encouraged to call with questions or concerns.    Patient/Family Understanding of Illness: He understands from my visit with him a couple days ago she is really sick. Yesterday, but to liver failure and decline she was made comfort care/GIP. He understands her remaining time is limited.  Goals: To keep her comfortable  Today's Discussion: In addition to the discussion about her clinical status, we discussed end of life care/comfort care. Discussed needing to rely on physical signs of discomfort being that she is not able to communicate. He seems concerned about "overdosing her" as he states was done in [place]. We discussed prn dosing and that doses of medication are given when a specific indication is met, and dosing is appropriate and based on what we're seeing. He seems more comfortable with this. I asked him to please le the nurse know if he sees anything concerning to indicate she is uncomfortable. We discussed his well-being during all of this. He described plans he's been making to anticipated life after she passes. For example she had recently rescued several dogs and he is now trying to find homes/placement for them.  I provided emotional and general support through therapeutic listening, therapeutic silence, empathy, sharing of stories, and other techniques. I answered all questions and addressed all concerns  to the best of my ability.  After meeting with the patient and significant other, I discussed the clinical case with the patient's nurse. Requested a dose of pain medication due to tachypnea (RR = 36/min). Asked her to message me for any symptom needs.  Review of Systems  Unable to perform ROS: Patient unresponsive   Objective:   Primary Diagnoses: Present on  Admission:  Acute hepatic encephalopathy  DNR (do not resuscitate)  Acute hepatic encephalopathy   Physical Exam Vitals and nursing note reviewed.  Constitutional:      General: She is not in acute distress.    Appearance: She is ill-appearing.  HENT:     Head: Normocephalic and atraumatic.  Cardiovascular:     Rate and Rhythm: Tachycardia present.  Pulmonary:     Effort: Tachypnea (RR = 36 bpm) present.  Abdominal:     General: Abdomen is flat.     Palpations: Abdomen is soft.  Skin:    General: Skin is warm and dry.     Coloration: Skin is jaundiced.  Neurological:     Mental Status: She is unresponsive.    Vital Signs:  Resp (!) 28   Palliative Assessment/Data: 10%    Advanced Care Planning:   Primary Decision Maker: NEXT OF KIN  Code Status/Advance Care Planning: DNR  Decisions/Changes to ACP: None today  Assessment & Plan:   Impression: 56 year old female progressing through end of life due to end stage liver disease and continued decline despite medical treatment. Yesterday she was readmitted as GIP/comfort care. SHe has appropriate comfort orders in place. She is tachypneic today and asked nursing for a dose of pain medication. If her medication needs increase we can transition to a comfort drip.  SUMMARY OF RECOMMENDATIONS   Continue DNR Continue comfort care/GIP Symptom management as per below PMT will continue to follow  Symptom Management:  Tylenol 650 mg pr as needed for mild pain or fever Benadryl 12.5 IV prn itching Fentanyl 50-100 mcg q 2 hours prn pain, respiratory distress, air hunger Robinul 0.2 mg IV q 4 hours prn excessive secretions Haldol 0.5 mg q 4 hours prn agitation or delerium Ativan 1 mg IV q 4 hours prn anxiety Zofrn 4 mg IV prn nausea/vomiting Liquifilm tears 1 gtt OU qid prn dry eyes  Prognosis:  Hours - Days  Discharge Planning:  Anticipated Hospital Death   Discussed with: Medical team, nursing team, patient's  family/significant other    Thank you for allowing [place] to participate in the care of AASIA PEAVLER PMT will continue to support holistically.  Billing based on MDM: High  Problems Addressed: One acute or chronic illness or injury that poses a threat to life or bodily function  Amount and/or Complexity of Data: Category 3:Discussion of management or test interpretation with external physician/other qualified health care professional/appropriate source (not separately reported)  Risks: Parenteral controlled substances   Signed by: Walden Field, NP Palliative Medicine Team  Team Phone # [phone] (Nights/Weekends)  05/12/2021, 10:25 AM

## 2021-05-13 DIAGNOSIS — R0603 Acute respiratory distress: Secondary | ICD-10-CM

## 2021-05-13 DIAGNOSIS — G9341 Metabolic encephalopathy: Secondary | ICD-10-CM

## 2021-05-13 DIAGNOSIS — R0682 Tachypnea, not elsewhere classified: Secondary | ICD-10-CM

## 2021-05-13 MED ORDER — HYDROMORPHONE BOLUS VIA INFUSION
0.5000 mg | INTRAVENOUS | Status: DC | PRN
  Filled 2021-05-13: qty 1

## 2021-05-13 MED ORDER — SODIUM CHLORIDE 0.9 % IV SOLN
0.5000 mg/h | INTRAVENOUS | Status: DC
  Administered 2021-05-13: 0.5 mg/h via INTRAVENOUS
  Filled 2021-05-13: qty 2.5

## 2021-05-13 NOTE — Progress Notes (Signed)
Daily Progress Note   Patient Name: [name]       Date: 05/13/2021 DOB: September 18, 1965  Age: 56 y.o. MRN#: [id number] Attending Physician: Rodena Goldmann, DO Primary Care Physician: [facility] Admit Date: 05/08/2021 Length of Stay: 2 days  Reason for Consultation/Follow-up: Terminal Care  HPI/Patient Profile:  56 y.o. female  with past medical history of alcoholic liver cirrhosis, diabetes mellitus hypertension, COPD, chronic respiratory failure, diastolic CHF. She presented to the ED with AMS. She was recently in [facility] and not taking lactulose as she should. She was admitted on 05/09/2021 with acute encephalopathy, chronic/subacute SDH, ETOH cirrhosis, pancytopenia, and others. She had continued decline despite treatment and recommended hospice. She was accepted to hospice but too sick to transfer and subsequently d/c and admitted to GIP status.   PMT was consulted for end of life and symptom management.  Subjective:   Subjective: Chart Reviewed. Updates received. Patient Assessed. Created space and opportunity for patient  and family to explore thoughts and feelings regarding current medical situation.  Today's Discussion: Patient is unable tp participate in meaningful conversation. I met with her significant other at the bedside. Discussed tachypnea and how I don't think she's aware enough to be in distress, but I will change her to a dilaudid drip to keep a continuous low dose of pain medication in her system to help with breathing and any non-verbalized discomfort. He states he was wondering about that, and I reminded him of his expressed concerns yesterday about getting "too much medication." However, I explained while prn pushes of Fentanyl were adequate previously, it appears her needs have increased and we will respond appropriately.  Provided emotional and general support through therapeutic listening, therapeutic silence, empathy, and other concerns. I answered all  questions and addressed all concerns to the best of my ability.  Review of Systems  Unable to perform ROS: Mental status change   Objective:   Vital Signs:  Resp (!) 28   Physical Exam: Physical Exam Vitals and nursing note reviewed.  Constitutional:      General: She is in acute distress (respiratory distress).     Appearance: She is ill-appearing and toxic-appearing.  Cardiovascular:     Rate and Rhythm: Tachycardia present.  Pulmonary:     Effort: Tachypnea and respiratory distress present.     Comments: RR = 46/min Skin:    General: Skin is warm and dry.     Coloration: Skin is jaundiced.  Neurological:     Mental Status: She is unresponsive.    Palliative Assessment/Data: 10%   Assessment & Plan:   Impression: Present on Admission:  Acute hepatic encephalopathy  DNR (do not resuscitate)  Acute hepatic encephalopathy  56 year old female progressing through end of life due to end stage liver disease and continued decline despite medical treatment. Yesterday she was readmitted as GIP/comfort care. She has appropriate comfort orders in place. She has gotten 200 mcg Fentanyl over 4 doses and 2 mg Ativan over 2 doses, both in the last 24 hours. She is quite tachypneic on my exam today and will change her from prn Fentanyl push to dilaudid drip w/ bolus availability.  SUMMARY OF RECOMMENDATIONS   Continue DNR Continue comfort care/GIP Symptom management as per below: change prn fentanyl to dilaudid drip with boluses PMT will continue to follow  Symptom Management:  Tylenol 650 mg pr as needed for mild pain or fever Benadryl 12.5 IV prn itching Dilaudid gtt 0.5-4 mg/hr Dilaudid boluses per  gtt of 0.5 mg q 15 min prn Robinul 0.2 mg IV q 4 hours prn excessive secretions Haldol 0.5 mg q 4 hours prn agitation or delerium Ativan 1 mg IV q 4 hours prn anxiety Zofrn 4 mg IV prn nausea/vomiting Liquifilm tears 1 gtt OU qid prn dry eyes  Code Status: DNR  Prognosis:  Hours - Days  Discharge Planning: Anticipated Hospital Death  Discussed with: Medical team, nursing team  Thank you for allowing [place] to participate in the care of [name] PMT will continue to support holistically.  Billing based on MDM: High  Problems Addressed: One or more chronic illnesses with severe exacerbation, progression, or side effects of treatment.  Amount and/or Complexity of Data: Category 3:Discussion of management or test interpretation with external physician/other qualified health care professional/appropriate source (not separately reported)  Risks: Parenteral controlled substances    Walden Field, NP Palliative Medicine Team  Team Phone # [phone] (Nights/Weekends)  11/17/2020, 8:17 AM

## 2021-05-13 NOTE — Progress Notes (Signed)
PROGRESS NOTE    [name]  DJM:[id number] DOB: 13-Aug-1965 DOA: 04/26/2021 PCP: [facility]   Brief Narrative:  56 y.o. female with medical history significant for alcoholic liver cirrhosis, diabetes mellitus hypertension, COPD, chronic respiratory failure, diastolic CHF. Patient was brought to the ED with reports of altered mental status.  Patient significant other was initially present at bedside.  Patient had been to [place] on a trip for a few days and returned last night.  Last night she was a bit confused, but this morning patient the confusion was worse, with patient yelling "help me help me," and had defecated on herself.  Patient's significant other does not think patient had been taking her lactulose. On my evaluation patient is awake, but continuously yelling out in a loud voice " "Get me up".  She is not answering questions.   Recent hospitalization 1/21 to 2/6-for acute on chronic respiratory failure with hypoxia secondary to pleural effusion, decompensated CHF, improved with diuresis.  She is on 4 L at baseline.  Macrocytic anemia requiring 1 unit of PRBC.  Acute hepatic encephalopathy treated with lactulose and rifaximin.  Due to worsening bilirubin underwent MRI abdomen with MRCP, findings of chronic dilated CBD no interventions recommended.   ED Course: Temperature 97.2.  O2 sats 92 to 95% on room air.  Hemoglobin stable at 9.  Platelets low at 52.  ALP elevated at 143.  Total bilirubin 18.3 consistent with prior.  Lactic acid 2.7.  Chest x-ray-Limited due to uncooperativeness, no noticeable abnormality. 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 Head CTSmall subacute to chronic subdural hematoma in the left frontoparietal convexity. Minimal effacement of the adjacent brain. 2. No acute hemorrhage or herniation.   Oral lactulose ordered in ED, patient  refused orals.  Geodone10 mg also given for imaging, without significant improvement in her mental status.   05/10/2021:  Pt remains completely obtunded, unable to take oral lactulose, ammonia remains elevated and unchanged despite lactulose enemas.  Changed to DNR (significant other brought in DNR paperwork).    05/07/2021:  Pt continues to decline.  No meaningful improvement.  Having increasing respiratory distress. Ammonia rising precipitously despite lactulose enemas.  I met with spouse at bedside and he says patient wanted to die at home and he wanted to make patient comfortable.  Unfortunately due to his work schedule patient would be alone at home and hospice not able to accept her for home care.  Pt anticipated to expire in days.  No oral intake in last 48 hours.  Comfort care orders started.  Referral requested for residential hospice care and spouse is agreeable.     Hospice evaluated and felt patient is too terminally ill to survive transfer to facility. Pt placed in GIP status.        Assessment & Plan:   Principal Problem:   Comfort measures only status Active Problems:   DNR (do not resuscitate)   Acute hepatic encephalopathy   Acute hepatic encephalopathy  Assessment and Plan: * Comfort measures only status Full comfort measures under GIP status  Continue pain management as ordered, titrate for comfort.  Full comfort order-set initiated.   Acute hepatic encephalopathy- (present on admission) Pt admitted under GIP for full comfort care measures.    DNR (do not resuscitate)- (present on admission) Full comfort measures     DVT prophylaxis:None Code Status: DNR Family Communication: Spouse at bedside 04/2018 Disposition  Plan:  Expected in-hospital death       Consultants:  Palliative   Procedures:  None  Antimicrobials:  None   Subjective: Patient seen and evaluated today and appears to be somewhat tachypneic.  Husband at bedside.  Objective: Vitals:    04/28/2021 1700  Resp: (!) 28    Intake/Output Summary (Last 24 hours) at 05/13/2021 1401 Last data filed at 05/13/2021 0827 Gross per 24 hour  Intake 0 ml  Output 0 ml  Net 0 ml   There were no vitals filed for this visit.  Examination:  General exam: Appears tachypneic and somnolent Respiratory system: Clear to auscultation. Respiratory effort normal. Cardiovascular system: S1 & S2 heard, RRR.  Gastrointestinal system: Abdomen is soft Central nervous system: Somnolent Extremities: No edema Skin: No significant lesions noted Psychiatry: Cannot be assessed    Data Reviewed: I have personally reviewed following labs and imaging studies  CBC: Recent Labs  Lab 05/09/21 1125 05/10/21 0419 05/02/2021 0338  WBC 3.4* 3.5* 5.5  HGB 9.0* 8.6* 8.2*  HCT 27.3* 26.2* 26.2*  MCV 108.3* 110.1* 113.9*  PLT 52* 54* 66*   Basic Metabolic Panel: Recent Labs  Lab 05/09/21 1125 05/09/21 1251 05/10/21 0419 05/10/2021 0338  NA 131*  --  133* 140  K 4.0  --  3.7 4.5  CL 95*  --  101 107  CO2 27  --  27 23  GLUCOSE 299*  --  127* 134*  BUN 16  --  15 30*  CREATININE 0.54  --  0.42* 0.85  CALCIUM 9.1  --  8.3* 8.4*  MG  --  1.5* 2.0 2.0  PHOS  --  3.0  --   --    GFR: Estimated Creatinine Clearance: 58.7 mL/min (by C-G formula based on SCr of 0.85 mg/dL). Liver Function Tests: Recent Labs  Lab 05/09/21 1125 05/10/21 0419 04/24/2021 0338  AST 61* 56* 82*  ALT _0 ALKPHOS 143* 104 98  BILITOT 18.3* 18.5* 18.9*  PROT 6.4* 5.7* 5.4*  ALBUMIN 2.6* 2.4* 2.3*   Recent Labs  Lab 05/09/21 1251  LIPASE 31   Recent Labs  Lab 05/09/21 1125 05/10/21 0842 05/04/2021 0338  AMMONIA 60* 65* 104*   Coagulation Profile: Recent Labs  Lab 05/09/21 1125 05/10/21 1049 05/17/2021 0338  INR 3.2* 3.2* 3.3*   Cardiac Enzymes: No results for input(s): CKTOTAL, CKMB, CKMBINDEX, TROPONINI in the last 168 hours. BNP (last 3 results) No results for input(s): PROBNP in the last 8760  hours. HbA1C: No results for input(s): HGBA1C in the last 72 hours. CBG: Recent Labs  Lab 05/10/21 1121 05/10/21 1607 05/10/21 2228 05/05/2021 0307 05/08/2021 0728  GLUCAP 161* 153* 133* 142* 146*   Lipid Profile: No results for input(s): CHOL, HDL, LDLCALC, TRIG, CHOLHDL, LDLDIRECT in the last 72 hours. Thyroid Function Tests: No results for input(s): TSH, T4TOTAL, FREET4, T3FREE, THYROIDAB in the last 72 hours. Anemia Panel: No results for input(s): VITAMINB12, FOLATE, FERRITIN, TIBC, IRON, RETICCTPCT in the last 72 hours. Sepsis Labs: Recent Labs  Lab 05/09/21 1251  LATICACIDVEN 2.7*    Recent Results (from the past 240 hour(s))  Resp Panel by RT-PCR (Flu A&B, Covid) Nasopharyngeal Swab     Status: None   Collection Time: 05/09/21 11:46 AM   Specimen: Nasopharyngeal Swab; Nasopharyngeal(NP) swabs in vial transport medium  Result Value Ref Range Status   SARS Coronavirus 2 by RT PCR NEGATIVE NEGATIVE Final    Comment: (NOTE) SARS-CoV-2 target nucleic acids  are NOT DETECTED.  The SARS-CoV-2 RNA is generally detectable in upper respiratory specimens during the acute phase of infection. The lowest concentration of SARS-CoV-2 viral copies this assay can detect is 138 copies/mL. A negative result does not preclude SARS-Cov-2 infection and should not be used as the sole basis for treatment or other patient management decisions. A negative result may occur with  improper specimen collection/handling, submission of specimen other than nasopharyngeal swab, presence of viral mutation(s) within the areas targeted by this assay, and inadequate number of viral copies(<138 copies/mL). A negative result must be combined with clinical observations, patient history, and epidemiological information. The expected result is Negative.  Fact Sheet for Patients:  EntrepreneurPulse.com.au  Fact Sheet for Healthcare Providers:   IncredibleEmployment.be  This test is no t yet approved or cleared by the [place] FDA and  has been authorized for detection and/or diagnosis of SARS-CoV-2 by FDA under an Emergency Use Authorization (EUA). This EUA will remain  in effect (meaning this test can be used) for the duration of the COVID-19 declaration under Section 564(b)(1) of the Act, 21 U.S.C.section 360bbb-3(b)(1), unless the authorization is terminated  or revoked sooner.       Influenza A by PCR NEGATIVE NEGATIVE Final   Influenza B by PCR NEGATIVE NEGATIVE Final    Comment: (NOTE) The Xpert Xpress SARS-CoV-2/FLU/RSV plus assay is intended as an aid in the diagnosis of influenza from Nasopharyngeal swab specimens and should not be used as a sole basis for treatment. Nasal washings and aspirates are unacceptable for Xpert Xpress SARS-CoV-2/FLU/RSV testing.  Fact Sheet for Patients: EntrepreneurPulse.com.au  Fact Sheet for Healthcare Providers: IncredibleEmployment.be  This test is not yet approved or cleared by the [place] FDA and has been authorized for detection and/or diagnosis of SARS-CoV-2 by FDA under an Emergency Use Authorization (EUA). This EUA will remain in effect (meaning this test can be used) for the duration of the COVID-19 declaration under Section 564(b)(1) of the Act, 21 U.S.C. section 360bbb-3(b)(1), unless the authorization is terminated or revoked.  Performed at [facility], [address] [place], [place] [address]   Culture, blood (routine x 2)     Status: None (Preliminary result)   Collection Time: 05/09/21 12:51 PM   Specimen: BLOOD RIGHT HAND  Result Value Ref Range Status   Specimen Description BLOOD RIGHT HAND BOTTLES DRAWN AEROBIC ONLY  Final   Special Requests Blood Culture adequate volume  Final   Culture   Final    NO GROWTH 4 DAYS Performed at [facility], [address] [place], [place] [address]     Report Status PENDING  Incomplete  Culture, blood (routine x 2)     Status: None (Preliminary result)   Collection Time: 05/09/21 12:51 PM   Specimen: BLOOD RIGHT WRIST  Result Value Ref Range Status   Specimen Description   Final    BLOOD RIGHT WRIST BOTTLES DRAWN AEROBIC AND ANAEROBIC   Special Requests Blood Culture adequate volume  Final   Culture   Final    NO GROWTH 4 DAYS Performed at [facility], [address], [place], [place] [address]    Report Status PENDING  Incomplete         Radiology Studies: No results found.      Scheduled Meds:  mouth rinse  15 mL Mouth Rinse BID   Continuous Infusions:  HYDROmorphone 0.5 mg/hr (05/13/21 1044)     LOS: 2 days    Time spent: 35 minutes  Cejay Cambre Darleen Crocker, DO [facility]  If 7PM-7AM, please contact night-coverage [email or web address] 05/13/2021, 2:01 PM

## 2021-05-14 LAB — CULTURE, BLOOD (ROUTINE X 2)
Culture: NO GROWTH
Culture: NO GROWTH
Special Requests: ADEQUATE
Special Requests: ADEQUATE

## 2021-05-19 NOTE — Progress Notes (Signed)
Stopped into patient's room. Found patient without a pulse and without respirations. MD notified. Significant other notified.

## 2021-05-19 NOTE — Progress Notes (Signed)
Patient had dilaudid 25mg  in NS 17mL running at 1mg  per hour via pump.  Wasted 69mL and was witnessed by Lawernce Keas, Therapist, sports.  Unable to document in pyxis.

## 2021-05-19 NOTE — Death Summary Note (Signed)
DEATH SUMMARY   Patient Details  Name: [name] MRN: [id number] DOB: June 10, 1965 UXN:ATFTDD, [facility]  Admission/Discharge Information   Admit Date:  June 01, 2021  Date of Death: Date of Death: 06/04/2021  Time of Death: Time of Death: 0225  Length of Stay: 3   Principle Cause of death: Acute hepatic encephalopathy  Hospital Diagnoses: Principal Problem:   Comfort measures only status Active Problems:   DNR (do not resuscitate)   Acute hepatic encephalopathy   Acute hepatic encephalopathy   Hospital Course: 56 y.o. female with medical history significant for alcoholic liver cirrhosis, diabetes mellitus hypertension, COPD, chronic respiratory failure, diastolic CHF. Patient was brought to the ED with reports of altered mental status.  Patient significant other was initially present at bedside.  Patient had been to [place] on a trip for a few days and returned last night.  Last night she was a bit confused, but this morning patient the confusion was worse, with patient yelling "help me help me," and had defecated on herself.  Patient's significant other does not think patient had been taking her lactulose. On my evaluation patient is awake, but continuously yelling out in a loud voice " "Get me up".  She is not answering questions.   Recent hospitalization 1/21 to 2/6-for acute on chronic respiratory failure with hypoxia secondary to pleural effusion, decompensated CHF, improved with diuresis.  She is on 4 L at baseline.  Macrocytic anemia requiring 1 unit of PRBC.  Acute hepatic encephalopathy treated with lactulose and rifaximin.  Due to worsening bilirubin underwent MRI abdomen with MRCP, findings of chronic dilated CBD no interventions recommended.   ED Course: Temperature 97.2.  O2 sats 92 to 95% on room air.  Hemoglobin stable at 9.  Platelets low at 52.  ALP elevated at 143.  Total bilirubin 18.3 consistent with prior.  Lactic acid 2.7.  Chest x-ray-Limited due to  uncooperativeness, no noticeable abnormality. 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 Head CTSmall subacute to chronic subdural hematoma in the left frontoparietal convexity. Minimal effacement of the adjacent brain. 2. No acute hemorrhage or herniation.   Oral lactulose ordered in ED, patient refused orals.  Geodone10 mg also given for imaging, without significant improvement in her mental status.   05/10/2021:  Pt remains completely obtunded, unable to take oral lactulose, ammonia remains elevated and unchanged despite lactulose enemas.  Changed to DNR (significant other brought in DNR paperwork).    2021/06/01:  Pt continues to decline.  No meaningful improvement.  Having increasing respiratory distress. Ammonia rising precipitously despite lactulose enemas.  I met with spouse at bedside and he says patient wanted to die at home and he wanted to make patient comfortable.  Unfortunately due to his work schedule patient would be alone at home and hospice not able to accept her for home care.  Pt anticipated to expire in days.  No oral intake in last 48 hours.  Comfort care orders started.  Referral requested for residential hospice care and spouse is agreeable.     Hospice evaluated and felt patient is too terminally ill to survive transfer to facility. Pt placed in GIP status.     Patient continued to remain on comfort measures per hospice protocol and was receiving intermittent pushes of fentanyl and other medications.  On 2/23 she started to become more tachypneic for which fentanyl drip was initiated.  She ultimately expired on 06-04-2022  at [facility].  Assessment and Plan: * Comfort measures only status Full comfort measures under GIP status  Continue pain management as ordered, titrate for comfort.  Full comfort order-set initiated.   Acute hepatic encephalopathy- (present on admission) Pt  admitted under GIP for full comfort care measures.    DNR (do not resuscitate)- (present on admission) Full comfort measures         Procedures: None  Consultations: Palliative care  The results of significant diagnostics from this hospitalization (including imaging, microbiology, ancillary and laboratory) are listed below for reference.   Significant Diagnostic Studies: CT HEAD WO CONTRAST (5MM)  Result Date: 05/10/2021 CLINICAL DATA:  Provided history: Subdural hematoma. Follow-up subdural hematoma. EXAM: CT HEAD WITHOUT CONTRAST TECHNIQUE: Contiguous axial images were obtained from the base of the skull through the vertex without intravenous contrast. RADIATION DOSE REDUCTION: This exam was performed according to the departmental dose-optimization program which includes automated exposure control, adjustment of the mA and/or kV according to patient size and/or use of iterative reconstruction technique. COMPARISON:  Prior head CT examinations 05/09/2021 and earlier. FINDINGS: Brain: Mildly motion degraded exam. Redemonstrated intermediate-to-low density, subacute-to-chronic subdural hematoma overlying the left cerebral hemisphere. Unchanged from the head CT performed yesterday, this hematoma measures up to 5 mm in thickness (remeasured on prior). As before, there is minimal mass effect upon the underlying left cerebral hemisphere with trace rightward midline shift. No demarcated cortical infarct. No evidence of an intracranial mass. No midline shift. Vascular: No hyperdense vessel. Skull: Normal. Negative for fracture or focal lesion. Sinuses/Orbits: Visualized orbits show no acute finding. No significant paranasal sinus disease. IMPRESSION: Mildly motion degraded exam. Unchanged subacute-to-chronic subdural hematoma overlying left cerebral hemisphere, measuring up to 5 mm. As before, there is minimal mass effect upon the underlying left cerebral hemisphere with trace rightward midline shift.  Electronically Signed   By: Kellie Simmering D.O.   On: 05/10/2021 14:01   CT Head Wo Contrast  Result Date: 05/09/2021 CLINICAL DATA:  Altered mental status EXAM: CT HEAD WITHOUT CONTRAST TECHNIQUE: Contiguous axial images were obtained from the base of the skull through the vertex without intravenous contrast. RADIATION DOSE REDUCTION: This exam was performed according to the departmental dose-optimization program which includes automated exposure control, adjustment of the mA and/or kV according to patient size and/or use of iterative reconstruction technique. COMPARISON:  CT head 03/16/2021 FINDINGS: Somewhat limited due to patient positioning and condition. Brain: No acute intraparenchymal hematoma. There is a low-density extra-axial likely subacute to chronic subdural hematoma on the left which measures up to 4 mm in thickness in the frontal convexity. Minimal mass effect on the adjacent brain. No evidence of herniation. No hydrocephalus. Vascular: No hyperdense vessel or unexpected calcification. Skull: Normal. Negative for fracture or focal lesion. Sinuses/Orbits: No acute finding. Other: None. IMPRESSION: 1. Small subacute to chronic subdural hematoma in the left frontoparietal convexity. Minimal effacement of the adjacent brain. 2. No acute hemorrhage or herniation. Findings discussed with Dr. Almyra Free over the telephone at 3:57 p.m. on 05/09/2021 with read back. Electronically Signed   By: Ofilia Neas M.D.   On: 05/09/2021 15:59   CT CHEST WO CONTRAST  Result Date: 04/20/2021 CLINICAL DATA:  Pneumonia, complication suspected. EXAM: CT CHEST WITHOUT CONTRAST TECHNIQUE: Multidetector CT imaging of the chest was performed following the standard protocol without IV contrast. RADIATION DOSE REDUCTION: This exam was performed according to the departmental dose-optimization program which includes automated exposure control, adjustment of the mA and/or kV according to patient size  and/or use of iterative  reconstruction technique. COMPARISON:  04/11/2021. FINDINGS: Cardiovascular: The heart is enlarged and there is no pericardial effusion. Scattered coronary artery calcifications are noted. There is atherosclerotic calcification of the aorta without evidence of aneurysm. The pulmonary trunk is normal in caliber. Mediastinum/Nodes: Shotty lymph nodes are present in the mediastinum. No axillary lymphadenopathy. Evaluation of the hila is limited due to lack of IV contrast. Surgical clips are present in the thyroid bed on the left. Hyperdense and hypodense nodules and calcifications are present in the right lobe of the thyroid gland. The trachea and esophagus are within normal limits. Lungs/Pleura: Patchy ground-glass opacities are present in the lungs bilaterally. There is consolidation in the lower lobes bilaterally. There are small to moderate bilateral pleural effusions. No pneumothorax is seen. Upper Abdomen: Cirrhotic changes are noted in the liver. A cystic lesion is present in the right lobe of the liver measuring 1.2 cm. The gallbladder is surgically absent. The spleen is enlarged. Mesenteric edema is noted and there is a small amount ascites in the right upper quadrant. Musculoskeletal: Anasarca is noted. A stable compression deformity is noted in the superior endplate of T7. No acute osseous abnormality. IMPRESSION: 1. Patchy ground-glass opacities in the lungs bilaterally with consolidation in the lower lobes, concerning for pneumonia versus edema. 2. Small to moderate bilateral pleural effusions. 3. Cardiomegaly with coronary artery calcifications. 4. Morphologic changes of cirrhosis and portal hypertension. 5. Small ascites in the right upper quadrant and anasarca. 6. Aortic atherosclerosis. Electronically Signed   By: Brett Fairy M.D.   On: 04/20/2021 23:29   CT Abdomen Pelvis W Contrast  Result Date: 05/09/2021 CLINICAL DATA:  Abdominal pain EXAM: CT ABDOMEN AND PELVIS WITH CONTRAST TECHNIQUE:  Multidetector CT imaging of the abdomen and pelvis was performed using the standard protocol following bolus administration of intravenous contrast. RADIATION DOSE REDUCTION: This exam was performed according to the departmental dose-optimization program which includes automated exposure control, adjustment of the mA and/or kV according to patient size and/or use of iterative reconstruction technique. CONTRAST:  171m OMNIPAQUE IOHEXOL 300 MG/ML  SOLN COMPARISON:  CT abdomen and pelvis 07/19/2020 FINDINGS: Lower chest: Cardiomegaly. Subsegmental atelectatic changes in the lung bases. Hepatobiliary: Diffuse nodularity of the liver with inhomogeneous parenchyma consistent with cirrhosis. Interval enlargement of a 13 mm well-defined hypodensity in the right hepatic lobe which likely represents a cyst. There is a faint ill-defined hypodensity near the dome of the right hepatic lobe measuring approximately 1 cm, not definitely seen on previous CT and no abnormality visualized in the region on recent MRI. Gallbladder is surgically absent. Extrahepatic biliary ductal dilatation similar to previous study, the common bile duct measures 16 mm in diameter. No significant intrahepatic ductal dilatation. Pancreas: Atrophic with no suspicious mass or ductal dilatation visualized. Spleen: Enlarged measuring 14.7 cm in length. Adrenals/Urinary Tract: No adrenal nodules identified. A few small renal cortical cysts visualized bilaterally. No hydronephrosis or definite enhancing renal mass identified. Urinary bladder appears grossly normal. Stomach/Bowel: No bowel obstruction, free air or pneumatosis. Stable chronic mild wall thickening throughout the small bowel with congestive appearance and accompanying hazy mesenteric stranding densities. No bowel wall edema identified. No evidence of acute appendicitis. Vascular/Lymphatic: Numerous large varices are again seen mostly in the upper mid abdomen and left upper quadrant, measuring up  to 2.5 cm in diameter. Cavernous transformation of the portal vein again seen. No definite bulky lymphadenopathy identified. Reproductive: Uterus and bilateral adnexa are unremarkable. Other: Small volume ascites.  Mild body  wall subcutaneous edema. Musculoskeletal: No suspicious bony lesions visualized. IMPRESSION: 1. Hepatic cirrhosis. 2. Portal hypertension including splenomegaly and numerous large varices mostly in the upper abdomen. 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 3. Small volume ascites. 4. Stable extrahepatic biliary ductal dilatation. 5. Chronic mild wall thickening of small bowel loops and hazy densities in the mesentery, likely related to cirrhosis and portal hypertension. Electronically Signed   By: Ofilia Neas M.D.   On: 05/09/2021 15:51   DG Chest Port 1 View  Result Date: 05/09/2021 CLINICAL DATA:  Altered mental status EXAM: PORTABLE CHEST 1 VIEW COMPARISON:  Chest x-ray 04/19/2021 FINDINGS: Study is significantly limited due to uncooperative patient/positioning. Left lung appears grossly clear with likely chronic mildly prominent interstitial lung markings. Heart is enlarged. No large pneumothorax visualized. IMPRESSION: As above. Electronically Signed   By: Ofilia Neas M.D.   On: 05/09/2021 13:24   DG Abd 2 Views  Result Date: 04/22/2021 CLINICAL DATA:  Abdominal pain EXAM: ABDOMEN - 2 VIEW COMPARISON:  02/25/2020 cholecystectomy clips FINDINGS: Are identified in the right upper quadrant of the abdomen. The bowel gas pattern is normal. There is no evidence of free air. No radio-opaque calculi or other significant radiographic abnormality is seen. Pleural effusions and bilateral pulmonary opacities are identified within the visualized portions of the lung bases. IMPRESSION: 1. Normal bowel gas pattern. 2. Previous cholecystectomy. 3. Bilateral pleural effusions and bilateral pulmonary opacities. Electronically Signed   By: Kerby Moors M.D.   On: 04/22/2021  13:51   MR ABDOMEN MRCP W WO CONTAST  Result Date: 04/22/2021 CLINICAL DATA:  Jaundice, elevated bilirubin, NASH cirrhosis, ETOH EXAM: MRI ABDOMEN WITHOUT AND WITH CONTRAST (INCLUDING MRCP) TECHNIQUE: Multiplanar multisequence MR imaging of the abdomen was performed both before and after the administration of intravenous contrast. Heavily T2-weighted images of the biliary and pancreatic ducts were obtained, and three-dimensional MRCP images were rendered by post processing. CONTRAST:  6.82m GADAVIST GADOBUTROL 1 MMOL/ML IV SOLN COMPARISON:  Right upper quadrant ultrasound dated 04/06/2021. CT abdomen/pelvis dated 07/19/2020. FINDINGS: Motion degraded images. Lower chest: Small bilateral pleural effusions. Hepatobiliary: Cirrhosis. 14 mm cyst in the posterior right hepatic lobe (series 4/image 12). No suspicious/enhancing hepatic lesions. Status post cholecystectomy. Mild central intrahepatic ductal prominence (series 4/image 12), unchanged. Dilated common duct, measuring up to 17 mm (series 4/image 16), unchanged. No choledocholithiasis is seen. Pancreas: Grossly unremarkable. No peripancreatic fluid collection/pseudocyst. Spleen: Splenomegaly, measuring 15.7 cm in maximal craniocaudal dimension. Adrenals/Urinary Tract:  Adrenal glands are within normal limits. Subcentimeter left upper pole renal cyst (series 4/image 21). Right kidney is within normal limits. No hydronephrosis. Stomach/Bowel: Stomach is within normal limits. Visualized bowel is grossly unremarkable. Vascular/Lymphatic: No evidence of abdominal aortic aneurysm. 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 Perisplenic varices with splenorenal shunt. No suspicious abdominal lymphadenopathy. Other:  Small volume abdominal ascites. Body wall edema. Musculoskeletal: No focal osseous lesions. IMPRESSION: Cirrhosis.  No findings suspicious for HCC. Status post cholecystectomy. Stable dilatation of the common duct, measuring  up to 17 mm. No choledocholithiasis is seen. Consider ERCP as clinically warranted to exclude distal CBD stricture. Splenomegaly. Small volume abdominal ascites. 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 Small bilateral pleural effusions.  Body wall edema. Electronically Signed   By: SJulian HyM.D.   On: 04/22/2021 22:44   ECHOCARDIOGRAM COMPLETE  Result Date: 04/21/2021    ECHOCARDIOGRAM REPORT   Patient Name:   DSEREENA MARANDODate  of Exam: 04/21/2021 Medical Rec #:  [id number]         Height:       63.0 in Accession #:    [id number]        Weight:       144.7 lb Date of Birth:  [birth year]         BSA:          1.685 m Patient Age:    56 years          BP:           121/55 mmHg Patient Gender: F                 HR:           78 bpm. Exam Location:  Inpatient Procedure: 2D Echo, Cardiac Doppler, Color Doppler and Strain Analysis Indications:    CHF  History:        Patient has prior history of Echocardiogram examinations, most                 recent 07/25/2020. COPD, Signs/Symptoms:Chest Pain and Murmur;                 Risk Factors:Diabetes and Hypertension.  Sonographer:    [facility] Referring Phys: [id number] Lequita Halt IMPRESSIONS  1. Left ventricular ejection fraction, by estimation, is 60 to 65%. The left ventricle has normal function. The left ventricle has no regional wall motion abnormalities. There is mild concentric left ventricular hypertrophy. Left ventricular diastolic parameters are indeterminate.  2. Right ventricular systolic function is normal. The right ventricular size is mildly enlarged. There is moderately elevated pulmonary artery systolic pressure.  3. Left atrial size was moderately dilated.  4. Right atrial size was mildly dilated.  5. The mitral valve is normal in structure. Moderate mitral valve regurgitation. No evidence of mitral stenosis.  6. Tricuspid valve regurgitation is mild to moderate.  7. The aortic valve is tricuspid. Aortic valve  regurgitation is not visualized. Mild aortic valve stenosis. Aortic valve area, by VTI measures 1.80 cm. Aortic valve mean gradient measures 15.7 mmHg. Aortic valve Vmax measures 2.62 m/s.  8. The inferior vena cava is dilated in size with <50% respiratory variability, suggesting right atrial pressure of 15 mmHg. FINDINGS  Left Ventricle: Left ventricular ejection fraction, by estimation, is 60 to 65%. The left ventricle has normal function. The left ventricle has no regional wall motion abnormalities. Global longitudinal strain performed but not reported based on interpreter judgement due to suboptimal tracking. The left ventricular internal cavity size was normal in size. There is mild concentric left ventricular hypertrophy. Left ventricular diastolic function could not be evaluated due to mitral regurgitation (moderate or greater). Left ventricular diastolic parameters are indeterminate. Right Ventricle: The right ventricular size is mildly enlarged. No increase in right ventricular wall thickness. Right ventricular systolic function is normal. There is moderately elevated pulmonary artery systolic pressure. The tricuspid regurgitant velocity is 3.02 m/s, and with an assumed right atrial pressure of 15 mmHg, the estimated right ventricular systolic pressure is 03.2 mmHg. Left Atrium: Left atrial size was moderately dilated. Right Atrium: Right atrial size was mildly dilated. Pericardium: There is no evidence of pericardial effusion. Mitral Valve: The mitral valve is normal in structure. Moderate mitral valve regurgitation. No evidence of mitral valve stenosis. Tricuspid Valve: The tricuspid valve is normal in structure. Tricuspid valve regurgitation is mild to moderate. No evidence of tricuspid stenosis. Aortic Valve: The aortic valve is tricuspid.  Aortic valve regurgitation is not visualized. Mild aortic stenosis is present. Aortic valve mean gradient measures 15.7 mmHg. Aortic valve peak gradient measures  27.5 mmHg. Aortic valve area, by VTI measures 1.80 cm. Pulmonic Valve: The pulmonic valve was normal in structure. Pulmonic valve regurgitation is not visualized. No evidence of pulmonic stenosis. Aorta: The aortic root is normal in size and structure. Venous: The inferior vena cava is dilated in size with less than 50% respiratory variability, suggesting right atrial pressure of 15 mmHg. IAS/Shunts: No atrial level shunt detected by color flow Doppler.  LEFT VENTRICLE PLAX 2D LVIDd:         5.10 cm      Diastology LVIDs:         2.80 cm      LV e' medial:    6.42 cm/s LV PW:         1.10 cm      LV E/e' medial:  21.3 LV IVS:        1.00 cm      LV e' lateral:   9.79 cm/s LVOT diam:     2.00 cm      LV E/e' lateral: 14.0 LV SV:         100 LV SV Index:   59 LVOT Area:     3.14 cm  LV Volumes (MOD) LV vol d, MOD A2C: 109.0 ml LV vol d, MOD A4C: 123.0 ml LV vol s, MOD A2C: 29.3 ml LV vol s, MOD A4C: 50.2 ml LV SV MOD A2C:     79.7 ml LV SV MOD A4C:     123.0 ml LV SV MOD BP:      82.2 ml RIGHT VENTRICLE RV Basal diam:  4.60 cm RV Mid diam:    3.50 cm RV S prime:     24.40 cm/s TAPSE (M-mode): 3.2 cm LEFT ATRIUM             Index        RIGHT ATRIUM           Index LA diam:        3.40 cm 2.02 cm/m   RA Area:     18.10 cm LA Vol (A2C):   59.9 ml 35.55 ml/m  RA Volume:   50.00 ml  29.67 ml/m LA Vol (A4C):   84.3 ml 50.03 ml/m LA Biplane Vol: 72.9 ml 43.26 ml/m  AORTIC VALVE                     PULMONIC VALVE AV Area (Vmax):    1.81 cm      PV Vmax:       1.51 m/s AV Area (Vmean):   1.78 cm      PV Vmean:      86.500 cm/s AV Area (VTI):     1.80 cm      PV VTI:        0.249 m AV Vmax:           262.33 cm/s   PV Peak grad:  9.1 mmHg AV Vmean:          184.000 cm/s  PV Mean grad:  4.0 mmHg AV VTI:            0.553 m AV Peak Grad:      27.5 mmHg AV Mean Grad:      15.7 mmHg LVOT Vmax:         151.00 cm/s LVOT Vmean:  104.300 cm/s LVOT VTI:          0.317 m LVOT/AV VTI ratio: 0.57  AORTA Ao Root diam: 2.50  cm Ao Asc diam:  2.90 cm MITRAL VALVE                 TRICUSPID VALVE MV Area (PHT): 3.06 cm      TR Peak grad:   36.5 mmHg MV Decel Time: 248 msec      TR Vmax:        302.00 cm/s MR Peak grad:   117.9 mmHg MR Mean grad:   80.0 mmHg    SHUNTS MR Vmax:        543.00 cm/s  Systemic VTI:  0.32 m MR Vmean:       421.0 cm/s   Systemic Diam: 2.00 cm MR PISA:        1.01 cm MR PISA Radius: 0.40 cm MV E velocity: 137.00 cm/s MV A velocity: 69.20 cm/s MV E/A ratio:  1.98 Kardie Tobb DO Electronically signed by Berniece Salines DO Signature Date/Time: 04/21/2021/5:27:36 PM    Final     Microbiology: Recent Results (from the past 240 hour(s))  Resp Panel by RT-PCR (Flu A&B, Covid) Nasopharyngeal Swab     Status: None   Collection Time: 05/09/21 11:46 AM   Specimen: Nasopharyngeal Swab; Nasopharyngeal(NP) swabs in vial transport medium  Result Value Ref Range Status   SARS Coronavirus 2 by RT PCR NEGATIVE NEGATIVE Final    Comment: (NOTE) SARS-CoV-2 target nucleic acids are NOT DETECTED.  The SARS-CoV-2 RNA is generally detectable in upper respiratory specimens during the acute phase of infection. The lowest concentration of SARS-CoV-2 viral copies this assay can detect is 138 copies/mL. A negative result does not preclude SARS-Cov-2 infection and should not be used as the sole basis for treatment or other patient management decisions. A negative result may occur with  improper specimen collection/handling, submission of specimen other than nasopharyngeal swab, presence of viral mutation(s) within the areas targeted by this assay, and inadequate number of viral copies(<138 copies/mL). A negative result must be combined with clinical observations, patient history, and epidemiological information. The expected result is Negative.  Fact Sheet for Patients:  EntrepreneurPulse.com.au  Fact Sheet for Healthcare Providers:  IncredibleEmployment.be  This test is no t yet  approved or cleared by the [place] FDA and  has been authorized for detection and/or diagnosis of SARS-CoV-2 by FDA under an Emergency Use Authorization (EUA). This EUA will remain  in effect (meaning this test can be used) for the duration of the COVID-19 declaration under Section 564(b)(1) of the Act, 21 U.S.C.section 360bbb-3(b)(1), unless the authorization is terminated  or revoked sooner.       Influenza A by PCR NEGATIVE NEGATIVE Final   Influenza B by PCR NEGATIVE NEGATIVE Final    Comment: (NOTE) The Xpert Xpress SARS-CoV-2/FLU/RSV plus assay is intended as an aid in the diagnosis of influenza from Nasopharyngeal swab specimens and should not be used as a sole basis for treatment. Nasal washings and aspirates are unacceptable for [employer] SARS-CoV-2/FLU/RSV testing.  Fact Sheet for Patients: EntrepreneurPulse.com.au  Fact Sheet for Healthcare Providers: IncredibleEmployment.be  This test is not yet approved or cleared by the [place] FDA and has been authorized for detection and/or diagnosis of SARS-CoV-2 by FDA under an Emergency Use Authorization (EUA). This EUA will remain in effect (meaning this test can be used) for the duration of the COVID-19 declaration under Section 564(b)(1) of the  Act, 21 U.S.C. section 360bbb-3(b)(1), unless the authorization is terminated or revoked.  Performed at [facility], [address], [place], [place] [address]   Culture, blood (routine x 2)     Status: None (Preliminary result)   Collection Time: 05/09/21 12:51 PM   Specimen: BLOOD RIGHT HAND  Result Value Ref Range Status   Specimen Description BLOOD RIGHT HAND BOTTLES DRAWN AEROBIC ONLY  Final   Special Requests Blood Culture adequate volume  Final   Culture   Final    NO GROWTH 4 DAYS Performed at [facility], [address] [place], [place] [address]    Report Status PENDING  Incomplete  Culture, blood (routine x 2)      Status: None (Preliminary result)   Collection Time: 05/09/21 12:51 PM   Specimen: BLOOD RIGHT WRIST  Result Value Ref Range Status   Specimen Description   Final    BLOOD RIGHT WRIST BOTTLES DRAWN AEROBIC AND ANAEROBIC   Special Requests Blood Culture adequate volume  Final   Culture   Final    NO GROWTH 4 DAYS Performed at [facility], [address] [place], [place] [address]    Report Status PENDING  Incomplete    Time spent: 30 minutes  Signed: Rodena Goldmann, DO 2021/06/12

## 2021-05-19 DEATH — deceased

## 2021-05-20 ENCOUNTER — Ambulatory Visit: Payer: Medicare Other | Admitting: Cardiology

## 2022-09-27 IMAGING — DX DG ABDOMEN 2V
3 series · 3 of 3 positions shown · non-contrast
Comparison: 02/25/2020 cholecystectomy clips

CLINICAL DATA: Abdominal pain

EXAM:
ABDOMEN - 2 VIEW

[abdomen erect]
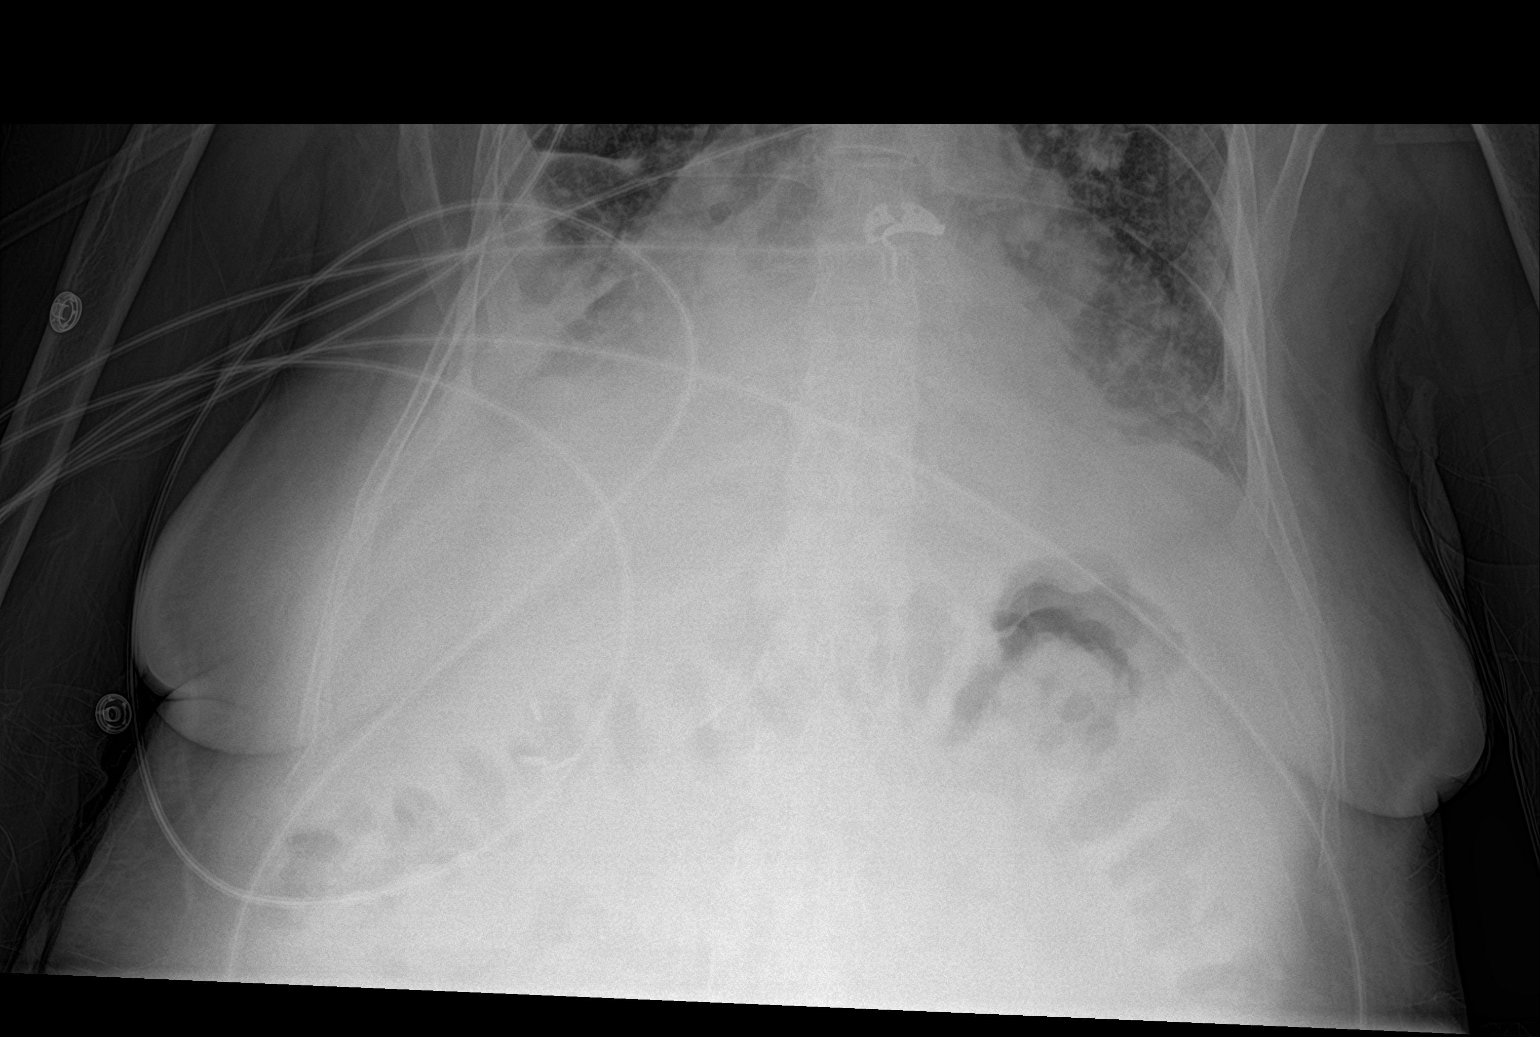

[abdomen supine (1 of 2)]
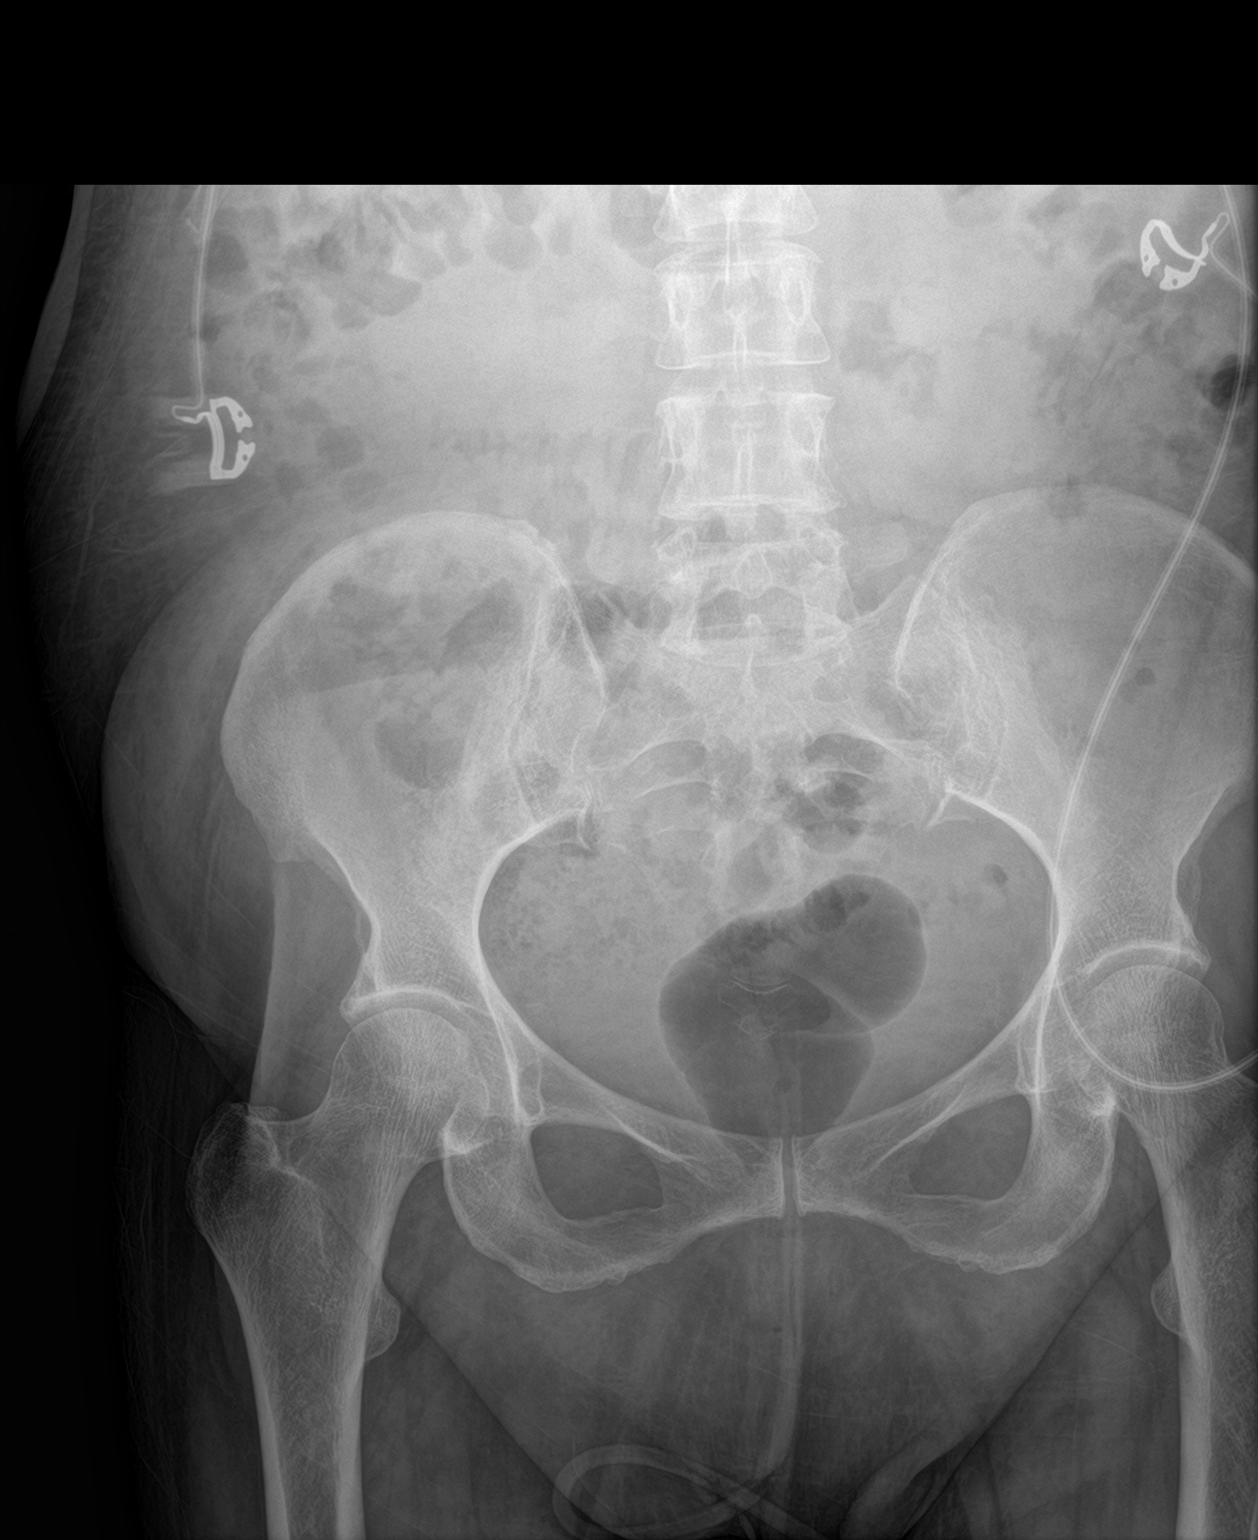

[abdomen supine (2 of 2)]
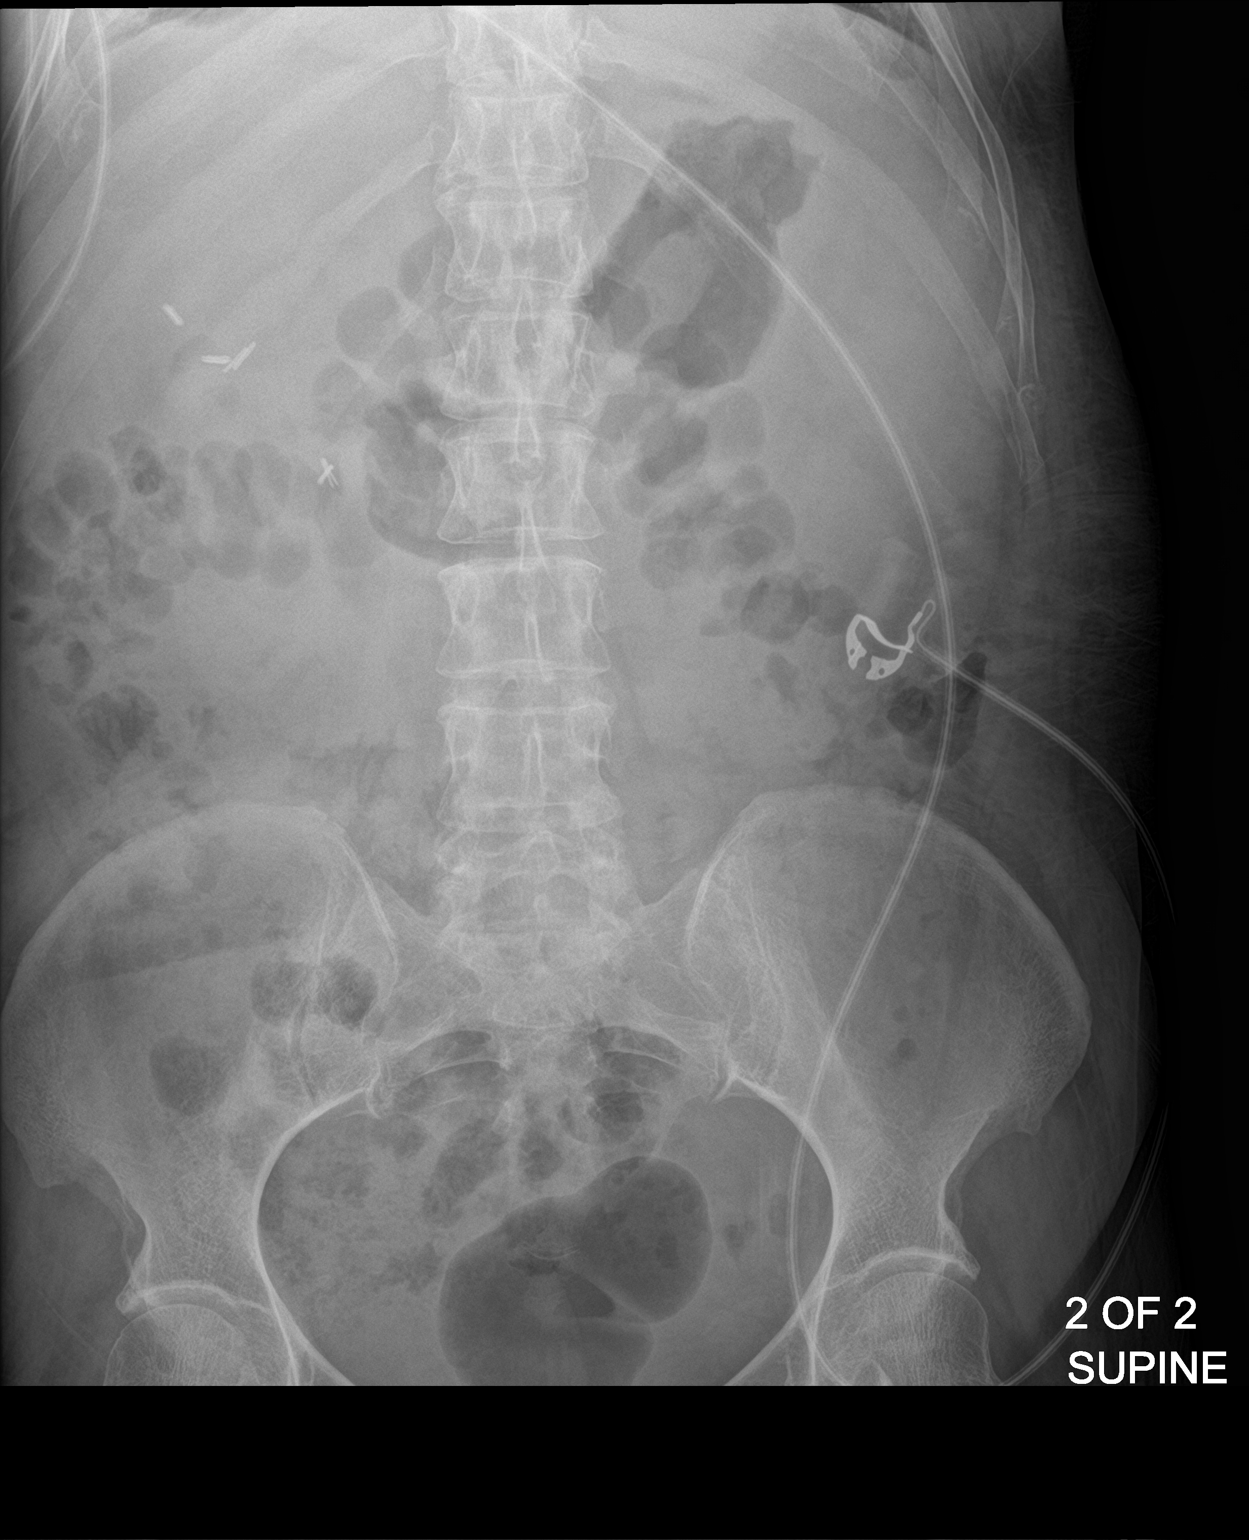

[3 of 3 positions shown; findings below may reference images not displayed]

FINDINGS: Are identified in the right upper quadrant of the abdomen. The bowel
gas pattern is normal. There is no evidence of free air. No
radio-opaque calculi or other significant radiographic abnormality
is seen. Pleural effusions and bilateral pulmonary opacities are
identified within the visualized portions of the lung bases.
IMPRESSION: 1. Normal bowel gas pattern.
2. Previous cholecystectomy.
3. Bilateral pleural effusions and bilateral pulmonary opacities.

## 2022-09-27 IMAGING — MR MR ABDOMEN WO/W CM MRCP
19 of 23 series · 42 of 48 positions shown · IV contrast (Contrast agent)
Comparison: Right upper quadrant ultrasound dated 04/06/2021. CT
abdomen/pelvis dated 07/19/2020.

CLINICAL DATA: Jaundice, elevated bilirubin, NASH cirrhosis, ETOH

EXAM:
MRI ABDOMEN WITHOUT AND WITH CONTRAST (INCLUDING MRCP)
TECHNIQUE: Multiplanar multisequence MR imaging of the abdomen was performed
both before and after the administration of intravenous contrast.
Heavily T2-weighted images of the biliary and pancreatic ducts were
obtained, and three-dimensional MRCP images were rendered by post
processing.
CONTRAST:  6.5mL GADAVIST GADOBUTROL 1 MMOL/ML IV SOLN

[Series 4: ax haste · axial · 6.0mm · 1.19mm/px · 1 of 30 slices shown]
[im 1/30]
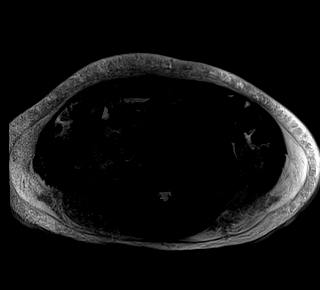

[Series 5: cor haste · coronal · 6.0mm · 1.31mm/px · 1 of 34 slices shown]
[im 1/34]
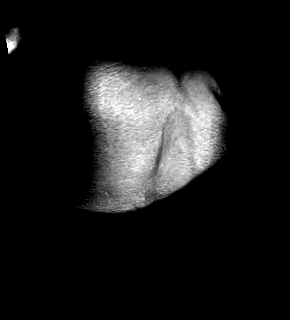

[Series 8: T2 fat-sat · axial · 6.0mm · 1.25mm/px · 1 of 33 slices shown]
[im 1/33]
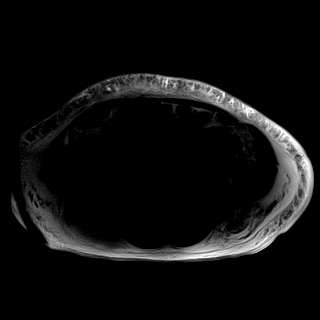

[Series 9: DWI · axial · 6.0mm · 1.42mm/px · z∈[-232,-2]mm · 2 of 99 slices shown (1 of 2)]
[im 1/99]
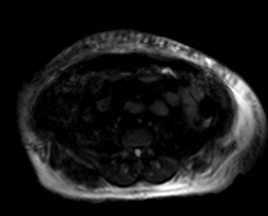
[im 99/99]
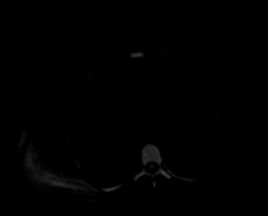

[Series 10: DWI · axial · 6.0mm · 1.42mm/px · 1 of 33 slices shown (2 of 2)]
[im 1/33]
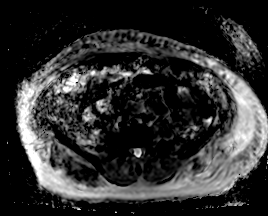

[Series 11: ax in and · axial · 3.0mm · 1.19mm/px · z∈[-217,-4]mm · 2 of 72 slices shown (1 of 2)]
[im 1/72]
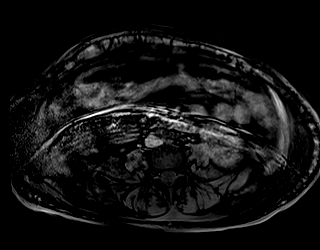
[im 72/72]
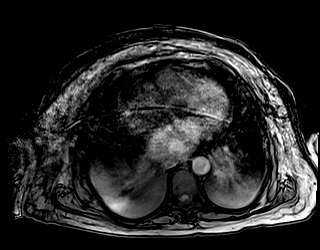

[Series 11: ax in and · axial · 3.0mm · 1.19mm/px · z∈[-217,-4]mm · 2 of 72 slices shown (2 of 2)]
[im 1/72]
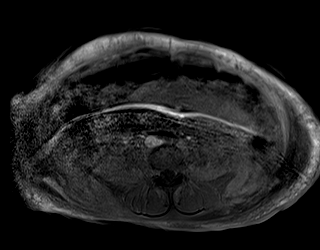
[im 72/72]
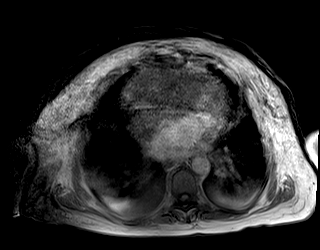

[Series 16: MRCP · coronal · 4.0mm · 1.12mm/px · 1 of 17 slices shown]
[im 1/17]
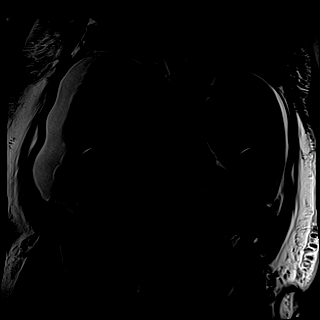

[Series 17: radials · coronal · 50.0mm · 0.78mm/px · 1 of 5 slices shown]
[im 1/5]
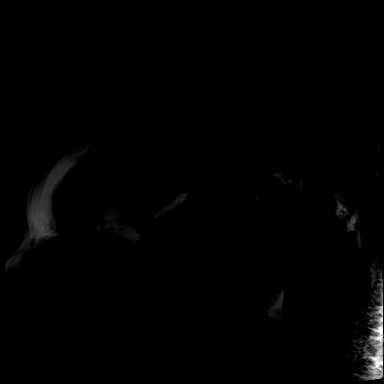

[Series 22: T1 dynamic · axial · non-contrast · 3.0mm · 1.19mm/px · z∈[-239,-2]mm · 3 of 80 slices shown (1 of 5)]
[im 1/80]
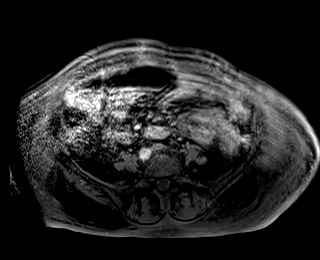
[im 40/80]
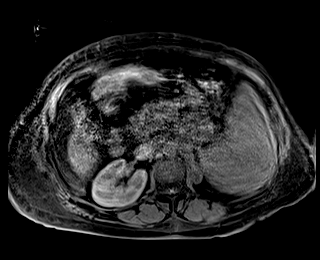
[im 80/80]
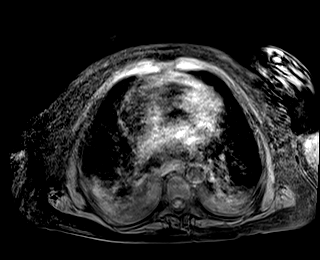

[Series 24: T1 dynamic post-contrast · axial · 3.0mm · 1.19mm/px · z∈[-239,-2]mm · 3 of 80 slices shown (1 of 5)]
[im 1/80]
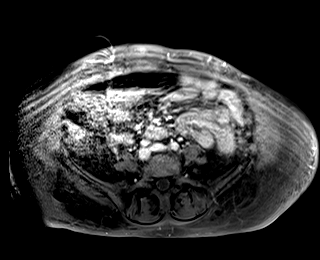
[im 40/80]
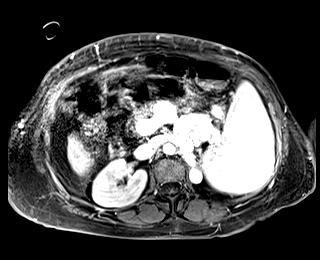
[im 80/80]
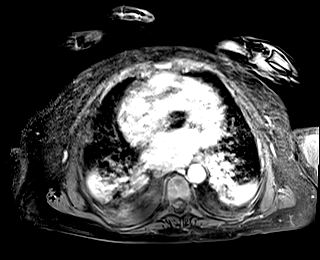

[Series 25: T1 dynamic · axial · 3.0mm · 1.19mm/px · z∈[-239,-2]mm · 3 of 80 slices shown (2 of 5)]
[im 1/80]
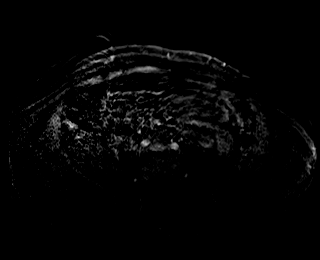
[im 40/80]
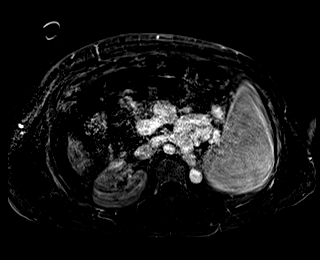
[im 80/80]
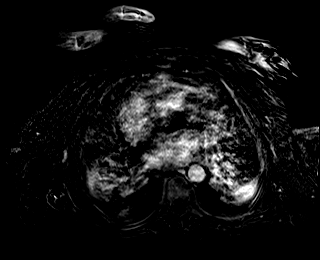

[Series 26: T1 dynamic post-contrast · axial · 3.0mm · 1.19mm/px · z∈[-239,-2]mm · 3 of 80 slices shown (2 of 5)]
[im 1/80]
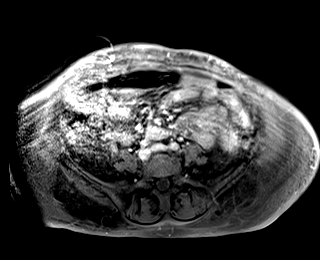
[im 40/80]
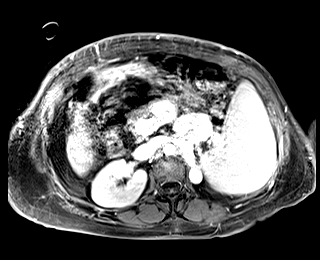
[im 80/80]
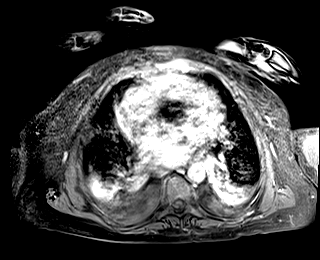

[Series 27: T1 dynamic · axial · 3.0mm · 1.19mm/px · z∈[-239,-2]mm · 3 of 80 slices shown (3 of 5)]
[im 1/80]
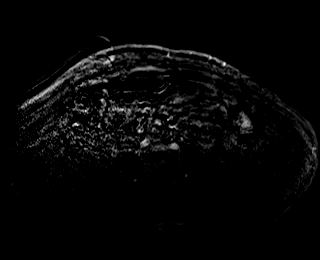
[im 40/80]
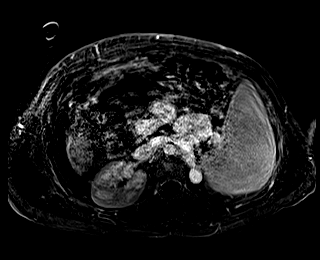
[im 80/80]
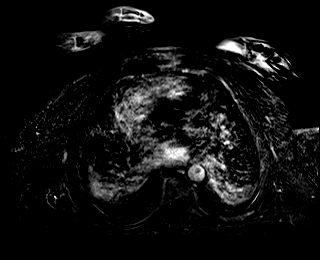

[Series 28: T1 dynamic post-contrast · axial · 3.0mm · 1.19mm/px · z∈[-239,-2]mm · 3 of 80 slices shown (3 of 5)]
[im 1/80]
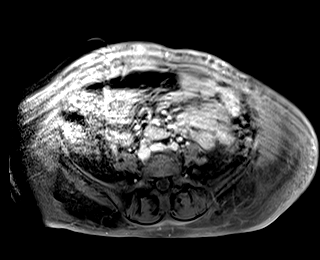
[im 40/80]
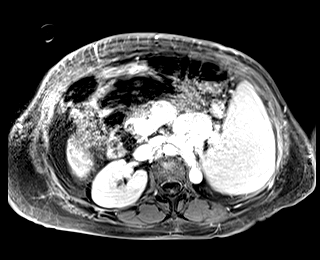
[im 80/80]
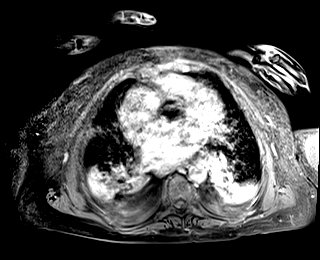

[Series 29: T1 dynamic · axial · 3.0mm · 1.19mm/px · z∈[-239,-2]mm · 3 of 80 slices shown (4 of 5)]
[im 1/80]
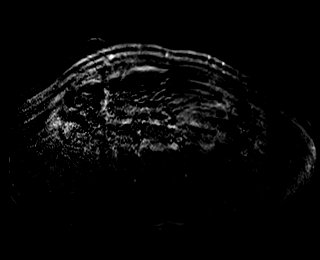
[im 40/80]
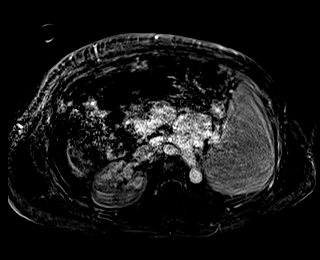
[im 80/80]
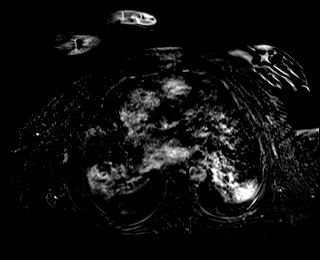

[Series 30: T1 dynamic post-contrast · axial · 3.0mm · 1.19mm/px · z∈[-239,-2]mm · 3 of 80 slices shown (4 of 5)]
[im 1/80]
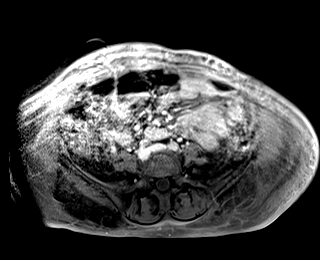
[im 40/80]
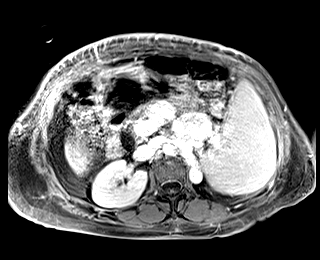
[im 80/80]
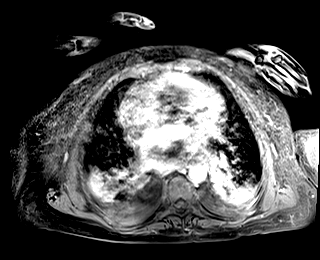

[Series 31: T1 dynamic · axial · 3.0mm · 1.19mm/px · z∈[-239,-2]mm · 3 of 80 slices shown (5 of 5)]
[im 1/80]
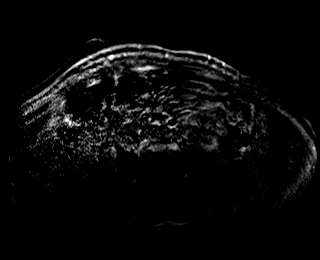
[im 40/80]
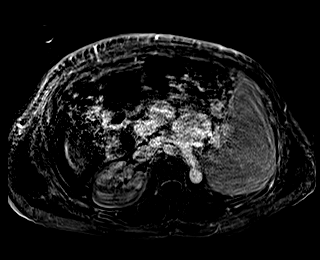
[im 80/80]
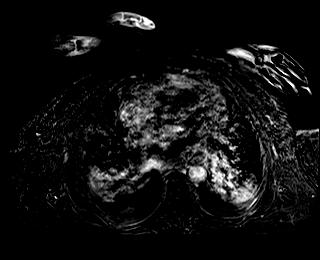

[Series 32: T1 dynamic post-contrast · coronal · 3.0mm · 1.31mm/px · 3 of 80 slices shown (5 of 5)]
[im 1/80]
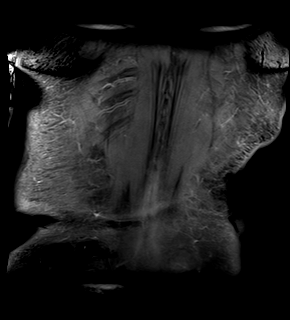
[im 40/80]
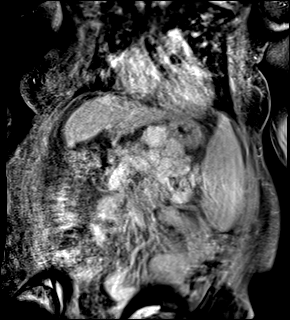
[im 80/80]
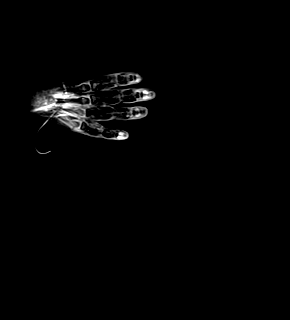

[42 of 48 positions shown; findings below may reference images not displayed]

FINDINGS: Motion degraded images.

Lower chest: Small bilateral pleural effusions.

Hepatobiliary: Cirrhosis. 14 mm cyst in the posterior right hepatic
lobe (series 4/image 12). No suspicious/enhancing hepatic lesions.

Status post cholecystectomy.

Mild central intrahepatic ductal prominence (series 4/image 12),
unchanged. Dilated common duct, measuring up to 17 mm (series
4/image 16), unchanged. No choledocholithiasis is seen.

Pancreas: Grossly unremarkable. No peripancreatic fluid
collection/pseudocyst.

Spleen: Splenomegaly, measuring 15.7 cm in maximal craniocaudal
dimension.

Adrenals/Urinary Tract:  Adrenal glands are within normal limits.

Subcentimeter left upper pole renal cyst (series 4/image 21). Right
kidney is within normal limits. No hydronephrosis.

Stomach/Bowel: Stomach is within normal limits.

Visualized bowel is grossly unremarkable.

Vascular/Lymphatic: No evidence of abdominal aortic aneurysm.
Chronic narrowing of the portal vein in the porta hepatis with
collaterals. Perisplenic varices with splenorenal shunt.

No suspicious abdominal lymphadenopathy.

Other:  Small volume abdominal ascites.

Body wall edema.

Musculoskeletal: No focal osseous lesions.
IMPRESSION: Cirrhosis.  No findings suspicious for HCC.

Status post cholecystectomy. Stable dilatation of the common duct,
measuring up to 17 mm. No choledocholithiasis is seen. Consider ERCP
as clinically warranted to exclude distal CBD stricture.

Splenomegaly. Small volume abdominal ascites. Chronic narrowing of
the portal vein with perisplenic varices and splenorenal shunt.

Small bilateral pleural effusions.  Body wall edema.
